# Patient Record
Sex: Female | Born: 1965 | Race: White | Hispanic: No | Smoking: Former smoker
Health system: Southern US, Community
[De-identification: ages and names within clinical notes are randomized; demographics above are authoritative.]

## PROBLEM LIST (undated history)

## (undated) DIAGNOSIS — C801 Malignant (primary) neoplasm, unspecified: Secondary | ICD-10-CM

## (undated) DIAGNOSIS — T4145XA Adverse effect of unspecified anesthetic, initial encounter: Secondary | ICD-10-CM

## (undated) DIAGNOSIS — Z1509 Genetic susceptibility to other malignant neoplasm: Secondary | ICD-10-CM

## (undated) DIAGNOSIS — K219 Gastro-esophageal reflux disease without esophagitis: Secondary | ICD-10-CM

## (undated) DIAGNOSIS — Z98811 Dental restoration status: Secondary | ICD-10-CM

## (undated) DIAGNOSIS — Z1501 Genetic susceptibility to malignant neoplasm of breast: Secondary | ICD-10-CM

## (undated) DIAGNOSIS — Z800 Family history of malignant neoplasm of digestive organs: Secondary | ICD-10-CM

## (undated) DIAGNOSIS — Z853 Personal history of malignant neoplasm of breast: Secondary | ICD-10-CM

## (undated) DIAGNOSIS — R05 Cough: Secondary | ICD-10-CM

## (undated) DIAGNOSIS — M1990 Unspecified osteoarthritis, unspecified site: Secondary | ICD-10-CM

## (undated) DIAGNOSIS — E119 Type 2 diabetes mellitus without complications: Secondary | ICD-10-CM

## (undated) DIAGNOSIS — J3489 Other specified disorders of nose and nasal sinuses: Secondary | ICD-10-CM

## (undated) DIAGNOSIS — T8859XA Other complications of anesthesia, initial encounter: Secondary | ICD-10-CM

## (undated) DIAGNOSIS — R519 Headache, unspecified: Secondary | ICD-10-CM

## (undated) DIAGNOSIS — R222 Localized swelling, mass and lump, trunk: Secondary | ICD-10-CM

## (undated) HISTORY — PX: COLONOSCOPY: SHX174

## (undated) HISTORY — DX: Genetic susceptibility to malignant neoplasm of breast: Z15.01

## (undated) HISTORY — DX: Family history of malignant neoplasm of digestive organs: Z80.0

## (undated) HISTORY — PX: ABDOMINAL HYSTERECTOMY: SHX81

## (undated) HISTORY — DX: Genetic susceptibility to other malignant neoplasm: Z15.09

## (undated) MED FILL — Dexamethasone Sodium Phosphate Inj 100 MG/10ML: INTRAMUSCULAR | Qty: 1 | Status: AC

## (undated) MED FILL — Fosaprepitant Dimeglumine For IV Infusion 150 MG (Base Eq): INTRAVENOUS | Qty: 5 | Status: AC

---

## 1993-08-07 HISTORY — PX: CHOLECYSTECTOMY: SHX55

## 1999-06-22 ENCOUNTER — Inpatient Hospital Stay (HOSPITAL_COMMUNITY): Admission: AD | Admit: 1999-06-22 | Discharge: 1999-06-22 | Payer: Self-pay | Admitting: Obstetrics and Gynecology

## 1999-07-26 ENCOUNTER — Encounter: Admission: RE | Admit: 1999-07-26 | Discharge: 1999-10-24 | Payer: Self-pay | Admitting: Obstetrics & Gynecology

## 1999-09-30 ENCOUNTER — Inpatient Hospital Stay (HOSPITAL_COMMUNITY): Admission: AD | Admit: 1999-09-30 | Discharge: 1999-10-03 | Payer: Self-pay | Admitting: Obstetrics and Gynecology

## 2001-02-25 ENCOUNTER — Emergency Department (HOSPITAL_COMMUNITY): Admission: EM | Admit: 2001-02-25 | Discharge: 2001-02-25 | Payer: Self-pay | Admitting: *Deleted

## 2001-02-25 ENCOUNTER — Encounter: Payer: Self-pay | Admitting: Emergency Medicine

## 2003-10-19 ENCOUNTER — Other Ambulatory Visit: Admission: RE | Admit: 2003-10-19 | Discharge: 2003-10-19 | Payer: Self-pay | Admitting: Obstetrics and Gynecology

## 2004-01-31 ENCOUNTER — Inpatient Hospital Stay (HOSPITAL_COMMUNITY): Admission: AD | Admit: 2004-01-31 | Discharge: 2004-01-31 | Payer: Self-pay | Admitting: Obstetrics and Gynecology

## 2004-04-29 ENCOUNTER — Inpatient Hospital Stay (HOSPITAL_COMMUNITY): Admission: RE | Admit: 2004-04-29 | Discharge: 2004-05-02 | Payer: Self-pay | Admitting: Obstetrics and Gynecology

## 2004-05-31 ENCOUNTER — Other Ambulatory Visit: Admission: RE | Admit: 2004-05-31 | Discharge: 2004-05-31 | Payer: Self-pay | Admitting: Obstetrics and Gynecology

## 2004-09-06 ENCOUNTER — Encounter: Admission: RE | Admit: 2004-09-06 | Discharge: 2004-10-06 | Payer: Self-pay | Admitting: Obstetrics and Gynecology

## 2011-08-19 ENCOUNTER — Emergency Department (HOSPITAL_COMMUNITY): Payer: Self-pay

## 2011-08-19 ENCOUNTER — Encounter (HOSPITAL_COMMUNITY): Payer: Self-pay | Admitting: *Deleted

## 2011-08-19 ENCOUNTER — Emergency Department (HOSPITAL_COMMUNITY)
Admission: EM | Admit: 2011-08-19 | Discharge: 2011-08-19 | Disposition: A | Discharge status: Home / Self Care | Payer: Self-pay | Attending: Emergency Medicine | Admitting: Emergency Medicine

## 2011-08-19 DIAGNOSIS — R109 Unspecified abdominal pain: Secondary | ICD-10-CM | POA: Insufficient documentation

## 2011-08-19 DIAGNOSIS — R300 Dysuria: Secondary | ICD-10-CM | POA: Insufficient documentation

## 2011-08-19 DIAGNOSIS — N949 Unspecified condition associated with female genital organs and menstrual cycle: Secondary | ICD-10-CM | POA: Insufficient documentation

## 2011-08-19 DIAGNOSIS — Z79899 Other long term (current) drug therapy: Secondary | ICD-10-CM | POA: Insufficient documentation

## 2011-08-19 LAB — URINALYSIS, ROUTINE W REFLEX MICROSCOPIC
Bilirubin Urine: NEGATIVE
Glucose, UA: NEGATIVE mg/dL
Ketones, ur: NEGATIVE mg/dL
Leukocytes, UA: NEGATIVE
Nitrite: NEGATIVE
Protein, ur: NEGATIVE mg/dL
Specific Gravity, Urine: 1.004 — ABNORMAL LOW (ref 1.005–1.030)
Urobilinogen, UA: 0.2 mg/dL (ref 0.0–1.0)
pH: 6 (ref 5.0–8.0)

## 2011-08-19 LAB — DIFFERENTIAL
Basophils Absolute: 0 10*3/uL (ref 0.0–0.1)
Basophils Relative: 0 % (ref 0–1)
Eosinophils Absolute: 0.1 10*3/uL (ref 0.0–0.7)
Eosinophils Relative: 2 % (ref 0–5)
Lymphocytes Relative: 23 % (ref 12–46)
Lymphs Abs: 1.7 10*3/uL (ref 0.7–4.0)
Monocytes Absolute: 0.4 10*3/uL (ref 0.1–1.0)
Monocytes Relative: 5 % (ref 3–12)
Neutro Abs: 5.1 10*3/uL (ref 1.7–7.7)
Neutrophils Relative %: 70 % (ref 43–77)

## 2011-08-19 LAB — WET PREP, GENITAL
Trich, Wet Prep: NONE SEEN
Yeast Wet Prep HPF POC: NONE SEEN

## 2011-08-19 LAB — CBC
HCT: 40.7 % (ref 36.0–46.0)
Hemoglobin: 13.7 g/dL (ref 12.0–15.0)
MCH: 29.1 pg (ref 26.0–34.0)
MCHC: 33.7 g/dL (ref 30.0–36.0)
MCV: 86.6 fL (ref 78.0–100.0)
Platelets: 292 10*3/uL (ref 150–400)
RBC: 4.7 MIL/uL (ref 3.87–5.11)
RDW: 13.8 % (ref 11.5–15.5)
WBC: 7.3 10*3/uL (ref 4.0–10.5)

## 2011-08-19 LAB — BASIC METABOLIC PANEL
BUN: 7 mg/dL (ref 6–23)
CO2: 24 mEq/L (ref 19–32)
Calcium: 9.4 mg/dL (ref 8.4–10.5)
Chloride: 101 mEq/L (ref 96–112)
Creatinine, Ser: 0.65 mg/dL (ref 0.50–1.10)
GFR calc Af Amer: >90 mL/min (ref >90)
GFR calc non Af Amer: >90 mL/min (ref >90)
Glucose, Bld: 100 mg/dL — ABNORMAL HIGH (ref 70–99)
Potassium: 3.8 mEq/L (ref 3.5–5.1)
Sodium: 137 mEq/L (ref 135–145)

## 2011-08-19 LAB — PREGNANCY, URINE: Preg Test, Ur: NEGATIVE

## 2011-08-19 LAB — URINE MICROSCOPIC-ADD ON

## 2011-08-19 MED ORDER — ONDANSETRON HCL 4 MG PO TABS: 4.0000 mg | ORAL_TABLET | Freq: Four times a day (QID) | ORAL | Status: DC

## 2011-08-19 MED ORDER — ONDANSETRON HCL 4 MG/2ML IJ SOLN
4.0000 mg | Freq: Once | INTRAMUSCULAR | Status: AC
  Administered 2011-08-19: 4 mg via INTRAVENOUS
  Filled 2011-08-19: qty 2

## 2011-08-19 MED ORDER — KETOROLAC TROMETHAMINE 30 MG/ML IJ SOLN
30.0000 mg | Freq: Once | INTRAMUSCULAR | Status: AC
  Administered 2011-08-19: 30 mg via INTRAVENOUS
  Filled 2011-08-19: qty 1

## 2011-08-19 MED ORDER — OXYCODONE-ACETAMINOPHEN 5-325 MG PO TABS: 2.0000 | ORAL_TABLET | ORAL | Status: AC | PRN

## 2011-08-19 MED ORDER — SODIUM CHLORIDE 0.9 % IV BOLUS (SEPSIS)
1000.0000 mL | Freq: Once | INTRAVENOUS | Status: AC
  Administered 2011-08-19: 1000 mL via INTRAVENOUS

## 2011-08-19 MED ORDER — PROMETHAZINE HCL 25 MG/ML IJ SOLN
25.0000 mg | INTRAMUSCULAR | Status: AC
  Administered 2011-08-19: 25 mg via INTRAVENOUS
  Filled 2011-08-19: qty 1

## 2011-08-19 MED ORDER — HYDROMORPHONE HCL PF 1 MG/ML IJ SOLN
1.0000 mg | Freq: Once | INTRAMUSCULAR | Status: AC
  Administered 2011-08-19: 1 mg via INTRAVENOUS
  Filled 2011-08-19: qty 1

## 2011-08-19 MED ORDER — IBUPROFEN 800 MG PO TABS: 800.0000 mg | ORAL_TABLET | Freq: Three times a day (TID) | ORAL | Status: AC

## 2011-08-19 MED ORDER — OXYCODONE-ACETAMINOPHEN 5-325 MG PO TABS
2.0000 | ORAL_TABLET | Freq: Once | ORAL | Status: AC
  Administered 2011-08-19: 2 via ORAL
  Filled 2011-08-19: qty 2

## 2011-08-19 NOTE — ED Notes (Signed)
Pt resting with no active vomiting at this time.

## 2011-08-19 NOTE — ED Notes (Signed)
Patient is resting comfortably. 

## 2011-08-19 NOTE — ED Notes (Signed)
Pelvic cart setup ready in CDU 11. MD made aware.

## 2011-08-19 NOTE — ED Provider Notes (Signed)
History     CSN: [id number]  Arrival date & time 08/19/11  1509   First MD Initiated Contact with Patient 08/19/11 1537      Chief Complaint  Patient presents with  . Nephrolithiasis    (Consider location/radiation/quality/duration/timing/severity/associated sxs/prior treatment) HPI Comments: Left-sided flank pain that started last night.  It comes and goes in waves it is sharp and stabbing it only goes away for about 30 seconds but comes back. It radiates to the left side of her abdomen to the suprapubic area. Taking Motrin without. She denies any nausea or vomiting, fever, dysuria or hematuria. She is on her menstrual cycle now but has no abnormal vaginal bleeding or discharge.  No history of kidney stones. No chest pain, shortness of breath.  No change in bowel habits.  The history is provided by the patient.    Past Medical History  Diagnosis Date  . Gestational diabetes     Past Surgical History  Procedure Date  . Cholecystectomy     History reviewed. No pertinent family history.  History  Substance Use Topics  . Smoking status: Former Games developer  . Smokeless tobacco: Not on file  . Alcohol Use: Yes    OB History    Grav Para Term Preterm Abortions TAB SAB Ect Mult Living                  Review of Systems  Constitutional: Positive for activity change and appetite change. Negative for fever.  HENT: Negative for congestion and rhinorrhea.   Respiratory: Negative for cough and shortness of breath.   Cardiovascular: Negative for chest pain.  Gastrointestinal: Positive for abdominal pain. Negative for nausea and vomiting.  Genitourinary: Positive for flank pain, difficulty urinating and pelvic pain. Negative for dysuria, hematuria, vaginal bleeding and vaginal discharge.  Musculoskeletal: Negative for back pain.  Skin: Negative for rash.  Neurological: Negative for weakness and headaches.    Allergies  Codeine  Home Medications   Current Outpatient Rx  Name  Route Sig Dispense Refill  . DIPHENHYDRAMINE-APAP (SLEEP) 25-500 MG PO TABS Oral Take 2 tablets by mouth at bedtime as needed. To help sleep for pain.    . IBUPROFEN 200 MG PO TABS Oral Take 800 mg by mouth every 6 (six) hours as needed. For pain.    . IBUPROFEN 800 MG PO TABS Oral Take 1 tablet (800 mg total) by mouth 3 (three) times daily. 21 tablet 0  . ONDANSETRON HCL 4 MG PO TABS Oral Take 1 tablet (4 mg total) by mouth every 6 (six) hours. 12 tablet 0  . OXYCODONE-ACETAMINOPHEN 5-325 MG PO TABS Oral Take 2 tablets by mouth every 4 (four) hours as needed for pain. 15 tablet 0    BP 106/70  Pulse 84  Temp(Src) 98.1 F (36.7 C) (Oral)  Resp 18  SpO2 97%  Physical Exam  Constitutional: She is oriented to person, place, and time. She appears well-developed and well-nourished. She appears distressed.       Uncomfortable rocking back and forth on a chair  HENT:  Head: Normocephalic and atraumatic.  Mouth/Throat: Oropharynx is clear and moist. No oropharyngeal exudate.  Eyes: Conjunctivae are normal. Pupils are equal, round, and reactive to light.  Neck: Normal range of motion. Neck supple.  Cardiovascular: Normal rate, regular rhythm and normal heart sounds.   Pulmonary/Chest: Effort normal and breath sounds normal. No respiratory distress.  Abdominal: Soft. There is tenderness. There is no rebound and no guarding.  L sided pain, LLQ and flank pain.  Genitourinary: There is no tenderness on the right labia. There is no tenderness on the left labia. Cervix exhibits no motion tenderness and no discharge. Right adnexum displays no mass and no tenderness. Left adnexum displays tenderness. Left adnexum displays no mass.  Musculoskeletal: She exhibits tenderness.       L CVAT  Neurological: She is alert and oriented to person, place, and time. No cranial nerve deficit.  Skin: Skin is warm and dry.    ED Course  Procedures (including critical care time)  Labs Reviewed  URINALYSIS,  ROUTINE W REFLEX MICROSCOPIC - Abnormal; Notable for the following:    Specific Gravity, Urine 1.004 (*)    Hgb urine dipstick LARGE (*)    All other components within normal limits  BASIC METABOLIC PANEL - Abnormal; Notable for the following:    Glucose, Bld 100 (*)    All other components within normal limits  URINE MICROSCOPIC-ADD ON - Abnormal; Notable for the following:    Squamous Epithelial / LPF FEW (*)    All other components within normal limits  WET PREP, GENITAL - Abnormal; Notable for the following:    Clue Cells, Wet Prep FEW (*)    WBC, Wet Prep HPF POC FEW (*)    All other components within normal limits  PREGNANCY, URINE  CBC  DIFFERENTIAL  GC/CHLAMYDIA PROBE AMP, GENITAL   Ct Abdomen Pelvis Wo Contrast  08/19/2011  *RADIOLOGY REPORT*  Clinical Data: Low back and left flank pain question kidney stone  CT ABDOMEN AND PELVIS WITHOUT CONTRAST  Technique:  Multidetector CT imaging of the abdomen and pelvis was performed following the standard protocol without intravenous contrast. Sagittal and coronal MPR images reconstructed from axial data set.  Comparison: None  Findings: Minimal dependent atelectasis left lower lobe. Gallbladder surgically absent. Kidneys normal appearance without hydronephrosis, ureteral dilatation or urinary tract calcification. Bladder decompressed. Within limits of a nonenhanced exam, no focal abnormalities of the spleen, pancreas, or adrenal glands. Patchy attenuation of liver suspect related to fatty infiltration and regional sparing. Unremarkable uterus and adnexae. Normal appendix. Stomach and bowel loops unremarkable for technique. No mass, adenopathy, free fluid or inflammatory process. No hernia or acute bony lesion.  IMPRESSION: No acute intra abdominal or intrapelvic abnormalities.  Original Report Authenticated By: Lollie Marrow, M.D.   US Transvaginal Non-ob  08/19/2011  *RADIOLOGY REPORT*  Clinical Data: Left adnexal pain  TRANSABDOMINAL AND  TRANSVAGINAL ULTRASOUND OF PELVIS Technique:  Both transabdominal and transvaginal ultrasound examinations of the pelvis were performed. Transabdominal technique was performed for global imaging of the pelvis including uterus, ovaries, adnexal regions, and pelvic cul-de-sac.  Comparison: None   It was necessary to proceed with endovaginal exam following the transabdominal exam to visualize the ovaries.  Findings:  Uterus: 9.5 x 5.4 x 5.9 cm.  Nabothian cyst at cervix.  No gross mass otherwise seen.  Endometrium: 11 mm thick, borderline thickened.  No endometrial fluid.  Right ovary:  Not identified on either transabdominal or endovaginal imaging, question related to obscuration by bowel.  Left ovary: Not identified on either transabdominal or endovaginal imaging, question related to obscuration by bowel  Other findings: No free pelvic fluid or adnexal masses identified.  IMPRESSION: Nabothian cyst at cervix. Borderline prominent endometrial complex. Nonvisualization of ovaries.  Original Report Authenticated By: Lollie Marrow, M.D.   US Pelvis Complete  08/19/2011  *RADIOLOGY REPORT*  Clinical Data: Left adnexal pain  TRANSABDOMINAL AND TRANSVAGINAL ULTRASOUND  OF PELVIS Technique:  Both transabdominal and transvaginal ultrasound examinations of the pelvis were performed. Transabdominal technique was performed for global imaging of the pelvis including uterus, ovaries, adnexal regions, and pelvic cul-de-sac.  Comparison: None   It was necessary to proceed with endovaginal exam following the transabdominal exam to visualize the ovaries.  Findings:  Uterus: 9.5 x 5.4 x 5.9 cm.  Nabothian cyst at cervix.  No gross mass otherwise seen.  Endometrium: 11 mm thick, borderline thickened.  No endometrial fluid.  Right ovary:  Not identified on either transabdominal or endovaginal imaging, question related to obscuration by bowel.  Left ovary: Not identified on either transabdominal or endovaginal imaging, question related  to obscuration by bowel  Other findings: No free pelvic fluid or adnexal masses identified.  IMPRESSION: Nabothian cyst at cervix. Borderline prominent endometrial complex. Nonvisualization of ovaries.  Original Report Authenticated By: Lollie Marrow, M.D.     1. Flank pain       MDM  Left-sided flank pain associated with difficulty urination dysuria. No nausea no vomiting. Patient is uncomfortable appearing but stable vitals. Abdomen soft and nonsurgical.  Concern for nephrolithiasis versus pyelonephritis versus urinary tract infection versus diverticulitis or colitis.  Urinalysis shows red cells the patient but the patient is on her menstrual cycle. CT scan is not showing evidence of nephrolithiasis. Is possible patient passed a small kidney stone but given the extent of her pain I did go ahead with a pelvic exam and pelvic ultrasound to evaluate her adnexa. He does have left adnexal tenderness. Ultrasound was unable to visualize ovaries, but CT scan did not show any masses or cysts.  Patient's pain is under control and tolerating by mouth. We'll arrange followup with gynecology as needed.    Glynn Octave, MD 08/19/11 2328

## 2011-08-19 NOTE — ED Notes (Signed)
PT ambulated with a steady gait; VSS; no signs of distress; A&Ox3; respirations even and unlabored; skin warm and dry.  

## 2011-08-19 NOTE — ED Notes (Signed)
Patient with possible kidney stones.  Started yesterday and is getting worse.  Pain is on her lower back and right flank area

## 2011-08-19 NOTE — ED Notes (Signed)
PT placed on O2

## 2011-08-19 NOTE — ED Notes (Signed)
Pt has severe L CVA pain, and jumps and guards when touched in that area

## 2011-08-19 NOTE — ED Notes (Signed)
Patient transported to Ultrasound 

## 2011-08-21 ENCOUNTER — Emergency Department (HOSPITAL_COMMUNITY)
Admission: EM | Admit: 2011-08-21 | Discharge: 2011-08-21 | Disposition: A | Discharge status: Home / Self Care | Payer: Self-pay | Attending: Emergency Medicine | Admitting: Emergency Medicine

## 2011-08-21 ENCOUNTER — Emergency Department (HOSPITAL_COMMUNITY): Payer: Self-pay

## 2011-08-21 ENCOUNTER — Encounter (HOSPITAL_COMMUNITY): Payer: Self-pay

## 2011-08-21 DIAGNOSIS — N898 Other specified noninflammatory disorders of vagina: Secondary | ICD-10-CM | POA: Insufficient documentation

## 2011-08-21 DIAGNOSIS — M549 Dorsalgia, unspecified: Secondary | ICD-10-CM | POA: Insufficient documentation

## 2011-08-21 DIAGNOSIS — B029 Zoster without complications: Secondary | ICD-10-CM | POA: Insufficient documentation

## 2011-08-21 DIAGNOSIS — R109 Unspecified abdominal pain: Secondary | ICD-10-CM | POA: Insufficient documentation

## 2011-08-21 DIAGNOSIS — R112 Nausea with vomiting, unspecified: Secondary | ICD-10-CM | POA: Insufficient documentation

## 2011-08-21 LAB — URINALYSIS, ROUTINE W REFLEX MICROSCOPIC
Glucose, UA: NEGATIVE mg/dL
Ketones, ur: 40 mg/dL — AB
Nitrite: POSITIVE — AB
Protein, ur: >300 mg/dL — AB
Specific Gravity, Urine: 1.013 (ref 1.005–1.030)
Urobilinogen, UA: 1 mg/dL (ref 0.0–1.0)
pH: 6.5 (ref 5.0–8.0)

## 2011-08-21 LAB — BASIC METABOLIC PANEL
BUN: 5 mg/dL — ABNORMAL LOW (ref 6–23)
CO2: 27 mEq/L (ref 19–32)
Calcium: 9.1 mg/dL (ref 8.4–10.5)
Chloride: 101 mEq/L (ref 96–112)
Creatinine, Ser: 0.62 mg/dL (ref 0.50–1.10)
GFR calc Af Amer: >90 mL/min (ref >90)
GFR calc non Af Amer: >90 mL/min (ref >90)
Glucose, Bld: 116 mg/dL — ABNORMAL HIGH (ref 70–99)
Potassium: 4 mEq/L (ref 3.5–5.1)
Sodium: 139 mEq/L (ref 135–145)

## 2011-08-21 LAB — CBC
HCT: 40.9 % (ref 36.0–46.0)
Hemoglobin: 13.6 g/dL (ref 12.0–15.0)
MCH: 29.1 pg (ref 26.0–34.0)
MCHC: 33.3 g/dL (ref 30.0–36.0)
MCV: 87.4 fL (ref 78.0–100.0)
Platelets: 266 10*3/uL (ref 150–400)
RBC: 4.68 MIL/uL (ref 3.87–5.11)
RDW: 13.7 % (ref 11.5–15.5)
WBC: 6.4 10*3/uL (ref 4.0–10.5)

## 2011-08-21 LAB — DIFFERENTIAL
Basophils Absolute: 0 10*3/uL (ref 0.0–0.1)
Basophils Relative: 0 % (ref 0–1)
Eosinophils Absolute: 0.1 10*3/uL (ref 0.0–0.7)
Eosinophils Relative: 1 % (ref 0–5)
Lymphocytes Relative: 21 % (ref 12–46)
Lymphs Abs: 1.3 10*3/uL (ref 0.7–4.0)
Monocytes Absolute: 0.3 10*3/uL (ref 0.1–1.0)
Monocytes Relative: 5 % (ref 3–12)
Neutro Abs: 4.7 10*3/uL (ref 1.7–7.7)
Neutrophils Relative %: 74 % (ref 43–77)

## 2011-08-21 LAB — GC/CHLAMYDIA PROBE AMP, GENITAL
Chlamydia, DNA Probe: NEGATIVE
GC Probe Amp, Genital: NEGATIVE

## 2011-08-21 LAB — URINE MICROSCOPIC-ADD ON

## 2011-08-21 MED ORDER — METOCLOPRAMIDE HCL 10 MG PO TABS: 10.0000 mg | ORAL_TABLET | Freq: Four times a day (QID) | ORAL | Status: AC

## 2011-08-21 MED ORDER — ONDANSETRON HCL 4 MG/2ML IJ SOLN
4.0000 mg | Freq: Once | INTRAMUSCULAR | Status: AC
  Administered 2011-08-21: 4 mg via INTRAVENOUS
  Filled 2011-08-21: qty 2

## 2011-08-21 MED ORDER — HYDROMORPHONE HCL PF 1 MG/ML IJ SOLN
1.0000 mg | Freq: Once | INTRAMUSCULAR | Status: AC
  Administered 2011-08-21: 1 mg via INTRAVENOUS
  Filled 2011-08-21: qty 1

## 2011-08-21 MED ORDER — KETOROLAC TROMETHAMINE 30 MG/ML IJ SOLN
30.0000 mg | Freq: Once | INTRAMUSCULAR | Status: AC
  Administered 2011-08-21: 30 mg via INTRAVENOUS
  Filled 2011-08-21 (×2): qty 1

## 2011-08-21 MED ORDER — LORAZEPAM 2 MG/ML IJ SOLN
1.0000 mg | Freq: Once | INTRAMUSCULAR | Status: AC
  Administered 2011-08-21: 1 mg via INTRAVENOUS
  Filled 2011-08-21: qty 1

## 2011-08-21 MED ORDER — METOCLOPRAMIDE HCL 5 MG/ML IJ SOLN
10.0000 mg | Freq: Once | INTRAMUSCULAR | Status: AC
  Administered 2011-08-21: 10 mg via INTRAVENOUS
  Filled 2011-08-21 (×2): qty 2

## 2011-08-21 MED ORDER — ACYCLOVIR 800 MG PO TABS: 800.0000 mg | ORAL_TABLET | Freq: Every day | ORAL | Status: AC

## 2011-08-21 MED ORDER — OXYCODONE-ACETAMINOPHEN 5-325 MG PO TABS: ORAL_TABLET | ORAL | Status: DC

## 2011-08-21 NOTE — ED Provider Notes (Signed)
Patient having recurrent pain - additional medications ordered. Will continue to observe.  Rodena Medin, PA-C 08/23/11 2350

## 2011-08-21 NOTE — ED Notes (Signed)
Pt speaking with case manager currently, IV team nurse just left bedside unable to get IV access, MD notified

## 2011-08-21 NOTE — ED Provider Notes (Addendum)
History     CSN: [id number]  Arrival date & time 08/21/11  1327   First MD Initiated Contact with Patient 08/21/11 1348      Chief Complaint  Patient presents with  . Back Pain  . Flank Pain    (Consider location/radiation/quality/duration/timing/severity/associated sxs/prior treatment) Patient is a 46 y.o. female presenting with flank pain. The history is provided by the patient.  Flank Pain This is a new problem. Episode onset: 3 days ago. The problem occurs constantly. The problem has been gradually worsening. Associated symptoms include abdominal pain. Pertinent negatives include no chest pain and no shortness of breath. Associated symptoms comments: No Fever, dysuria, frequency, urgency. The symptoms are aggravated by walking, bending and twisting (Palpation). The symptoms are relieved by nothing. She has tried nothing (percocet, ibuprofen, tylenol) for the symptoms. The treatment provided no relief.    Past Medical History  Diagnosis Date  . Gestational diabetes     Past Surgical History  Procedure Date  . Cholecystectomy     History reviewed. No pertinent family history.  History  Substance Use Topics  . Smoking status: Former Games developer  . Smokeless tobacco: Not on file  . Alcohol Use: Yes    OB History    Grav Para Term Preterm Abortions TAB SAB Ect Mult Living                  Review of Systems  Respiratory: Negative for shortness of breath.   Cardiovascular: Negative for chest pain.  Gastrointestinal: Positive for nausea, vomiting and abdominal pain. Negative for diarrhea and constipation.  Genitourinary: Positive for flank pain and vaginal bleeding. Negative for dysuria and vaginal discharge.  All other systems reviewed and are negative.    Allergies  Codeine  Home Medications   Current Outpatient Rx  Name Route Sig Dispense Refill  . DIPHENHYDRAMINE-APAP (SLEEP) 25-500 MG PO TABS Oral Take 2 tablets by mouth at bedtime as needed. To help sleep  for pain.    . IBUPROFEN 800 MG PO TABS Oral Take 1 tablet (800 mg total) by mouth 3 (three) times daily. 21 tablet 0  . OXYCODONE-ACETAMINOPHEN 5-325 MG PO TABS Oral Take 2 tablets by mouth every 4 (four) hours as needed for pain. 15 tablet 0    BP 141/91  Pulse 100  Temp(Src) 98.8 F (37.1 C) (Oral)  Resp 22  Ht 5\' 3"  (1.6 m)  Wt 210 lb (95.255 kg)  BMI 37.20 kg/m2  SpO2 97%  LMP 08/21/2011  Physical Exam  Nursing note and vitals reviewed. Constitutional: She is oriented to person, place, and time. She appears well-developed and well-nourished. She appears distressed.  HENT:  Head: Normocephalic and atraumatic.  Mouth/Throat: Oropharynx is clear and moist.  Eyes: Conjunctivae and EOM are normal. Pupils are equal, round, and reactive to light.  Neck: Normal range of motion. Neck supple.  Cardiovascular: Normal rate, regular rhythm and intact distal pulses.   No murmur heard. Pulmonary/Chest: Effort normal and breath sounds normal. No respiratory distress. She has no wheezes. She has no rales.  Abdominal: Soft. She exhibits no distension. There is tenderness. There is guarding and CVA tenderness. There is no rebound.  Musculoskeletal: Normal range of motion. She exhibits no edema and no tenderness.  Neurological: She is alert and oriented to person, place, and time.  Skin: Skin is warm and dry. No rash noted. No erythema.  Psychiatric: She has a normal mood and affect. Her behavior is normal.    ED Course  Procedures (including critical care time)   Labs Reviewed  CBC  DIFFERENTIAL  BASIC METABOLIC PANEL  URINALYSIS, ROUTINE W REFLEX MICROSCOPIC   Ct Abdomen Pelvis Wo Contrast  08/19/2011  *RADIOLOGY REPORT*  Clinical Data: Low back and left flank pain question kidney stone  CT ABDOMEN AND PELVIS WITHOUT CONTRAST  Technique:  Multidetector CT imaging of the abdomen and pelvis was performed following the standard protocol without intravenous contrast. Sagittal and coronal  MPR images reconstructed from axial data set.  Comparison: None  Findings: Minimal dependent atelectasis left lower lobe. Gallbladder surgically absent. Kidneys normal appearance without hydronephrosis, ureteral dilatation or urinary tract calcification. Bladder decompressed. Within limits of a nonenhanced exam, no focal abnormalities of the spleen, pancreas, or adrenal glands. Patchy attenuation of liver suspect related to fatty infiltration and regional sparing. Unremarkable uterus and adnexae. Normal appendix. Stomach and bowel loops unremarkable for technique. No mass, adenopathy, free fluid or inflammatory process. No hernia or acute bony lesion.  IMPRESSION: No acute intra abdominal or intrapelvic abnormalities.  Original Report Authenticated By: Lollie Marrow, M.D.   US Transvaginal Non-ob  08/19/2011  *RADIOLOGY REPORT*  Clinical Data: Left adnexal pain  TRANSABDOMINAL AND TRANSVAGINAL ULTRASOUND OF PELVIS Technique:  Both transabdominal and transvaginal ultrasound examinations of the pelvis were performed. Transabdominal technique was performed for global imaging of the pelvis including uterus, ovaries, adnexal regions, and pelvic cul-de-sac.  Comparison: None   It was necessary to proceed with endovaginal exam following the transabdominal exam to visualize the ovaries.  Findings:  Uterus: 9.5 x 5.4 x 5.9 cm.  Nabothian cyst at cervix.  No gross mass otherwise seen.  Endometrium: 11 mm thick, borderline thickened.  No endometrial fluid.  Right ovary:  Not identified on either transabdominal or endovaginal imaging, question related to obscuration by bowel.  Left ovary: Not identified on either transabdominal or endovaginal imaging, question related to obscuration by bowel  Other findings: No free pelvic fluid or adnexal masses identified.  IMPRESSION: Nabothian cyst at cervix. Borderline prominent endometrial complex. Nonvisualization of ovaries.  Original Report Authenticated By: Lollie Marrow, M.D.    US Pelvis Complete  08/19/2011  *RADIOLOGY REPORT*  Clinical Data: Left adnexal pain  TRANSABDOMINAL AND TRANSVAGINAL ULTRASOUND OF PELVIS Technique:  Both transabdominal and transvaginal ultrasound examinations of the pelvis were performed. Transabdominal technique was performed for global imaging of the pelvis including uterus, ovaries, adnexal regions, and pelvic cul-de-sac.  Comparison: None   It was necessary to proceed with endovaginal exam following the transabdominal exam to visualize the ovaries.  Findings:  Uterus: 9.5 x 5.4 x 5.9 cm.  Nabothian cyst at cervix.  No gross mass otherwise seen.  Endometrium: 11 mm thick, borderline thickened.  No endometrial fluid.  Right ovary:  Not identified on either transabdominal or endovaginal imaging, question related to obscuration by bowel.  Left ovary: Not identified on either transabdominal or endovaginal imaging, question related to obscuration by bowel  Other findings: No free pelvic fluid or adnexal masses identified.  IMPRESSION: Nabothian cyst at cervix. Borderline prominent endometrial complex. Nonvisualization of ovaries.  Original Report Authenticated By: Lollie Marrow, M.D.     No diagnosis found.    MDM   Abrupt onset of left flank pain rating around into the abdomen that started on Friday. Patient was seen ER on 08/19/2011 at that time she had a ultrasound that ruled out ovarian cysts and a CT that was negative. She did have hematuria however she states she is on her period  and most likely was a contaminant. Patient's symptoms sound like a kidney stone however she has not been able to get pain control despite ibuprofen and Percocet. Straightly uncomfortable on exam without any infectious symptoms. Movement and palpation make her pain worse. She denies any urinary symptoms. There are no rashes to suggest zoster.  Will repeat CBC, BMP, cathed UA, KUB and give pain control.  Pt moved to CDU.      Gwyneth Sprout, MD 08/21/11  1452  Gwyneth Sprout, MD 08/21/11 1506

## 2011-08-21 NOTE — ED Notes (Signed)
Here for left flank and back pain, radiaitign to groin, started on Friday seen for same then. sts that no help with meds given. No issues with uriantion.

## 2011-08-21 NOTE — ED Notes (Signed)
Pt requesting a doctor's note to be faxed to their attorney stating that "patient is in no condition to go to court on tomorrow." informed patient that we could provide her with a general note stating that she was here in the ED and that we are unable to take the responsibility to faxing it her her attorney d/t legal issues. Patient not satisfied with this and requests to see the MD. Informed Horris Latino, PA who will see the patient. Spoke with Elita Quick, AD who agrees with RN and states that we are unable to personalize notes for patients nor fax anything HIPPA standards.

## 2011-08-24 NOTE — ED Provider Notes (Signed)
Medical screening examination/treatment/procedure(s) were conducted as a shared visit with non-physician practitioner(s) and myself.  I personally evaluated the patient during the encounter I initially saw the pt and treatment was continued in the CDU and I have reviewed not and agree with ongoing treatment.  Gwyneth Sprout, MD 08/24/11 [phone]

## 2013-08-07 DIAGNOSIS — Z853 Personal history of malignant neoplasm of breast: Secondary | ICD-10-CM

## 2013-08-07 HISTORY — DX: Personal history of malignant neoplasm of breast: Z85.3

## 2014-02-17 ENCOUNTER — Other Ambulatory Visit: Payer: Self-pay | Admitting: Dermatology

## 2014-03-05 ENCOUNTER — Other Ambulatory Visit: Payer: Self-pay | Admitting: Radiology

## 2014-03-06 ENCOUNTER — Other Ambulatory Visit: Payer: Self-pay | Admitting: Radiology

## 2014-03-06 DIAGNOSIS — C50912 Malignant neoplasm of unspecified site of left female breast: Secondary | ICD-10-CM

## 2014-03-10 ENCOUNTER — Telehealth (INDEPENDENT_AMBULATORY_CARE_PROVIDER_SITE_OTHER): Payer: Self-pay

## 2014-03-10 NOTE — Telephone Encounter (Signed)
LMOM for pt to call me. I need to speak to her about r/s the appt from 8/6 to 8/5 per Dr Cristal Generous request.

## 2014-03-11 ENCOUNTER — Ambulatory Visit (INDEPENDENT_AMBULATORY_CARE_PROVIDER_SITE_OTHER): Payer: 59 | Admitting: General Surgery

## 2014-03-11 ENCOUNTER — Encounter (INDEPENDENT_AMBULATORY_CARE_PROVIDER_SITE_OTHER): Payer: Self-pay | Admitting: General Surgery

## 2014-03-11 VITALS — BP 110/78 | HR 80 | Resp 18 | Ht 64.0 in | Wt 231.0 lb

## 2014-03-11 DIAGNOSIS — C50112 Malignant neoplasm of central portion of left female breast: Secondary | ICD-10-CM | POA: Insufficient documentation

## 2014-03-11 DIAGNOSIS — C50119 Malignant neoplasm of central portion of unspecified female breast: Secondary | ICD-10-CM

## 2014-03-11 NOTE — Progress Notes (Signed)
Patient ID: Carlita Whitcomb, female   DOB: 1966-06-30, 48 y.o.   MRN: [id number]  Chief Complaint  Patient presents with  . Other    Eval lt br ca and eval melanoma lt upper back    HPI Aarthi Uyeno is a 48 y.o. female.  Referred by Dr Christene Slates and Dr Nena Polio HPI 63 yof who presents after initially undergoing evaluation with Dr Allyson Sabal as she was told she had a changing mole in her upper midback.  She had numerous significant sun exposures as a kid.  This underwent shave biopsy that shows a superficial spreading melanoma that is 1.7 mm thick.  There is no ulceration.  The margins are mostly clear right now.  MI is 1 per sq mm.  No LVI.  There is partial regression noted.  She has never had a prior mm and the melanoma concerned her. She was doing a breast exam and found a left breast mass.  She then went and got a mm that showed an irregular mass and calcs in left breast total dimension was 2.4 cm.  US showed a 8 mm mass in left breast.  [place] of left axilla was negative.  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  This is grade I-II.  She comes in today to discuss options.  Past Medical History  Diagnosis Date  . Gestational diabetes     Past Surgical History  Procedure Laterality Date  . Cholecystectomy      1195  . Cesarean section  1999  . Cesarean section  2001  . Cesarean section  2005    Family History  Problem Relation Age of Onset  . Cancer Paternal Aunt   . Cancer Paternal Grandmother     Social History History  Substance Use Topics  . Smoking status: Former Research scientist (life sciences)  . Smokeless tobacco: Not on file  . Alcohol Use: No    Allergies  Allergen Reactions  . Codeine Nausea And Vomiting    Current Outpatient Prescriptions  Medication Sig Dispense Refill  . diphenhydramine-acetaminophen (TYLENOL PM) 25-500 MG TABS Take 2 tablets by mouth at bedtime as needed. To help sleep for  pain.       No current facility-administered medications for this visit.    Review of Systems Review of Systems  Constitutional: Negative for fever, chills and unexpected weight change.  HENT: Negative for congestion, hearing loss, sore throat, trouble swallowing and voice change.   Eyes: Negative for visual disturbance.  Respiratory: Positive for cough. Negative for wheezing.   Cardiovascular: Positive for palpitations. Negative for chest pain and leg swelling.  Gastrointestinal: Negative for nausea, vomiting, abdominal pain, diarrhea, constipation, blood in stool, abdominal distention and anal bleeding.  Genitourinary: Negative for hematuria, vaginal bleeding and difficulty urinating.  Musculoskeletal: Negative for arthralgias.  Skin: Negative for rash and wound.  Neurological: Positive for headaches. Negative for seizures and syncope.  Hematological: Negative for adenopathy. Does not bruise/bleed easily.  Psychiatric/Behavioral: Negative for confusion.    Blood pressure 110/78, pulse 80, resp. rate 18, height 5\' 4"  (1.626 m), weight 231 lb (104.781 kg).  Physical Exam Physical Exam  Vitals reviewed. Constitutional: She appears well-developed and well-nourished.  Eyes: No scleral icterus.  Neck: Neck supple.  Cardiovascular: Normal rate, regular rhythm and normal heart sounds.   Pulmonary/Chest: Effort normal and breath sounds normal. She has no wheezes. She has no rales. Right breast  exhibits no inverted nipple, no mass, no nipple discharge, no skin change and no tenderness. Left breast exhibits mass (near nac but difficult to tell right now due to tenderness and hematoma). Left breast exhibits no inverted nipple, no nipple discharge, no skin change and no tenderness.  Lymphadenopathy:    She has no cervical adenopathy.    She has no axillary adenopathy.       Right: No supraclavicular adenopathy present.       Left: No supraclavicular adenopathy present.    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 Breast cancer  We will get her genetics appt due to young age (and melanoma although this appears sun related).  Will wait for surgery until results back. We discussed mri and decided to proceed.  She understands limitations and need for possible biopsies.  Her tumor is favorable and we discussed oncotype on final pathology.  I will call her once mri results are back to start proceeding. We discussed the staging and pathophysiology of breast cancer. We discussed all of the different options for treatment for breast cancer including surgery, chemotherapy, radiation therapy, Herceptin, and antiestrogen therapy.   We discussed a sentinel lymph node biopsy as she does not appear to having lymph node involvement right now. We discussed the performance of that with injection of radioactive tracer. We discussed that she would have an incision underneath her axillary hairline. We discussed that there is a chance of having a positive node with a sentinel lymph node biopsy and we will await the permanent pathology to make any other first further decisions in terms of her treatment. One of these options might be to return to the operating room to perform an axillary lymph node dissection. We discussed up to a 10% risk lifetime of chronic shoulder pain as well as lymphedema associated with a sentinel lymph node biopsy.  I think this risk will also depend on where the melanoma sentinel node is. We discussed the options for treatment of the breast cancer which included lumpectomy versus a mastectomy. We discussed the performance of the lumpectomy with radioactive seed placement. We discussed a 5-10% chance of a positive margin requiring reexcision in the operating room. We also discussed that she will need radiation therapy and antiestrogen therapy she undergoes lumpectomy. We discussed the mastectomy and  the postoperative care for that as well. We discussed that there is no difference in her survival whether she undergoes lumpectomy with radiation therapy versus a mastectomy.  We discussed the risks of operation including bleeding, infection, possible reoperation. She understands her further therapy will be based on what her stages at the time of her operation.  2. Melanoma  This is 1.7 mm thick melanoma.  We discussed therapy for melanoma.  I recommended excision with 2 cm margin ( not sure if this will be deeper on final path) and can do this fairly easily on her back.  I also recommended sentinel node biopsy for the melanoma.  I am not sure where the drainage is so will obtain a lymphoscintogram preoperatively to determine where to do this sentinel node.  I will call her after that.  Will plan on doing all surgeries at same time.        Denese Mentink 03/11/2014, 7:27 PM

## 2014-03-12 ENCOUNTER — Encounter (INDEPENDENT_AMBULATORY_CARE_PROVIDER_SITE_OTHER): Payer: 59 | Admitting: General Surgery

## 2014-03-12 ENCOUNTER — Telehealth (INDEPENDENT_AMBULATORY_CARE_PROVIDER_SITE_OTHER): Payer: Self-pay | Admitting: *Deleted

## 2014-03-12 ENCOUNTER — Telehealth: Payer: Self-pay | Admitting: *Deleted

## 2014-03-12 NOTE — Telephone Encounter (Signed)
Informed pt of message below. Pt verbalized understanding  

## 2014-03-12 NOTE — Telephone Encounter (Signed)
LMOM for pt regarding her NM Lymph/Gland test.  Advised pt it has been scheduled for 03-18-14 at [facility] arriving at 9:45 for 10:00 apt.  No prep required.  Anderson Malta

## 2014-03-12 NOTE — Telephone Encounter (Signed)
Received referral from [place].  Called and left message for pt to return my call so I can schedule her for a genetic appt.

## 2014-03-13 ENCOUNTER — Encounter (INDEPENDENT_AMBULATORY_CARE_PROVIDER_SITE_OTHER): Payer: Self-pay

## 2014-03-13 ENCOUNTER — Ambulatory Visit
Admission: RE | Admit: 2014-03-13 | Discharge: 2014-03-13 | Disposition: A | Discharge status: Home / Self Care | Payer: 59 | Source: Ambulatory Visit | Attending: Radiology | Admitting: Radiology

## 2014-03-13 ENCOUNTER — Telehealth: Payer: Self-pay | Admitting: *Deleted

## 2014-03-13 DIAGNOSIS — C50912 Malignant neoplasm of unspecified site of left female breast: Secondary | ICD-10-CM

## 2014-03-13 MED ORDER — GADOBENATE DIMEGLUMINE 529 MG/ML IV SOLN
20.0000 mL | Freq: Once | INTRAVENOUS | Status: AC | PRN
  Administered 2014-03-13: 20 mL via INTRAVENOUS

## 2014-03-13 NOTE — Telephone Encounter (Signed)
Pt returned my call and I confirmed 03/20/14 genetic appt w/ pt.  Emailed [facility] [place] to make them aware.

## 2014-03-14 ENCOUNTER — Other Ambulatory Visit (INDEPENDENT_AMBULATORY_CARE_PROVIDER_SITE_OTHER): Payer: Self-pay | Admitting: General Surgery

## 2014-03-18 ENCOUNTER — Encounter (HOSPITAL_COMMUNITY)
Admission: RE | Admit: 2014-03-18 | Discharge: 2014-03-18 | Disposition: A | Discharge status: Home / Self Care | Payer: 59 | Source: Ambulatory Visit | Attending: General Surgery | Admitting: General Surgery

## 2014-03-18 DIAGNOSIS — C50119 Malignant neoplasm of central portion of unspecified female breast: Secondary | ICD-10-CM | POA: Insufficient documentation

## 2014-03-18 DIAGNOSIS — C50112 Malignant neoplasm of central portion of left female breast: Secondary | ICD-10-CM

## 2014-03-18 MED ORDER — TECHNETIUM TC 99M SULFUR COLLOID FILTERED
0.5000 | Freq: Once | INTRAVENOUS | Status: AC | PRN
  Administered 2014-03-18: 0.5 via INTRADERMAL

## 2014-03-20 ENCOUNTER — Other Ambulatory Visit: Payer: 59

## 2014-03-20 ENCOUNTER — Ambulatory Visit (HOSPITAL_BASED_OUTPATIENT_CLINIC_OR_DEPARTMENT_OTHER): Payer: 59 | Admitting: Genetic Counselor

## 2014-03-20 ENCOUNTER — Encounter: Payer: Self-pay | Admitting: Genetic Counselor

## 2014-03-20 DIAGNOSIS — IMO0002 Reserved for concepts with insufficient information to code with codable children: Secondary | ICD-10-CM

## 2014-03-20 DIAGNOSIS — C50919 Malignant neoplasm of unspecified site of unspecified female breast: Secondary | ICD-10-CM

## 2014-03-20 DIAGNOSIS — Z803 Family history of malignant neoplasm of breast: Secondary | ICD-10-CM

## 2014-03-20 DIAGNOSIS — C439 Malignant melanoma of skin, unspecified: Secondary | ICD-10-CM | POA: Insufficient documentation

## 2014-03-20 DIAGNOSIS — C50912 Malignant neoplasm of unspecified site of left female breast: Secondary | ICD-10-CM

## 2014-03-20 NOTE — Progress Notes (Signed)
Patient Name: Krystal [name] Patient Age: 48 y.o. Encounter Date: 03/20/2014  Referring Physician: Rolm Bookbinder, MD  Primary Care Provider: Darlyn Chamber, MD   [name], a 48 y.o. female, is being seen at the [facility] due to a personal and family history of breast cancer.  She presents to clinic today to discuss the possibility of a hereditary predisposition to cancer and discuss whether genetic testing is warranted.  HISTORY OF PRESENT ILLNESS: [name] was recently diagnosed with both melanoma and breast cancer at the age of 24. The melanoma is on her upper mid-back and she states she had excessive sun exposure as a child. The breast cancer is in her left breast and is an IDC. The breast tumor is ER positive, PR positive, and HER2 negative. She stated that surgical decisions will depend on results of genetic testing.   Past Medical History  Diagnosis Date  . Gestational diabetes   . Breast cancer   . Melanoma   . Family history of malignant neoplasm of breast     Past Surgical History  Procedure Laterality Date  . Cholecystectomy      1195  . Cesarean section  1999  . Cesarean section  2001  . Cesarean section  2005    History   Social History  . Marital Status: Widowed    Spouse Name: N/A    Number of Children: N/A  . Years of Education: N/A   Social History Main Topics  . Smoking status: Former Research scientist (life sciences)  . Smokeless tobacco: Not on file  . Alcohol Use: No  . Drug Use: No  . Sexual Activity: Not on file   Other Topics Concern  . Not on file   Social History Narrative  . No narrative on file     FAMILY HISTORY:   During the visit, a 4-generation pedigree was obtained. Significant diagnoses include the following:  Family History  Problem Relation Age of Onset  . Breast cancer Paternal Aunt 37    deceased 20s  . Breast cancer Paternal Grandmother     dx 23s; ? if had 2nd BC in 12s; deceased 64s  . Pancreatic cancer Paternal  Uncle 66    smoker; deceased    Additionally, [name] has two sons (age 70 and 40) and a daughter (age 38). She has two brothers (age 19 and 41). Her mother is 65 and cancer-free. There are no cancers in her maternal relatives. Her father is 60 and cancer-free. His only sister and brother's cancer's are noted above.  [name] ancestry is Caucasian - NOS. There is no known Jewish ancestry and no consanguinity.  ASSESSMENT AND PLAN: [name] is a 48 y.o. female with a personal and family history of breast cancer. This history is not highly suggestive of a hereditary predisposition to cancer given the ages of diagnosis in her paternal aunt and paternal grandmother. We reviewed the characteristics, features and inheritance patterns of hereditary cancer syndromes. We also discussed genetic testing, including the process of testing, insurance coverage and implications of results. A negative result will be reassuring for her and her family.  [name] wished to pursue genetic testing and a blood sample will be sent to [facility] for analysis of the BRCA1 and BRCA2 genes. If there is no mutation detected, testing will proceed to analyze the other 15 genes on the BreastNext gene panel. BRCA1/BRCA2 was ordered as a STAT request first to provide this information for surgical decision making. We  discussed the implications of a positive, negative and/ or Variant of Uncertain Significance (VUS) result. Results of BRCA testing should be available in approximately 2 weeks, at which point we will contact her and address implications for her as well as address genetic testing for at-risk family members, if needed.    We encouraged [name] to remain in contact with Cancer Genetics annually so that we can update the family history and inform her of any changes in cancer genetics and testing that may be of benefit for this family. Ms.  Krystal [name] questions were answered to her satisfaction today.   Thank you for the  referral and allowing [place] to share in the care of your patient.   The patient was seen for a total of 30 minutes, greater than 50% of which was spent face-to-face counseling. This patient was discussed with the overseeing provider who agrees with the above.   Steele Berg, MS, [place] Certified Genetic Counseor phone: [phone] [email or web address]

## 2014-03-23 ENCOUNTER — Ambulatory Visit (INDEPENDENT_AMBULATORY_CARE_PROVIDER_SITE_OTHER): Payer: 59 | Admitting: Surgery

## 2014-03-30 ENCOUNTER — Telehealth (INDEPENDENT_AMBULATORY_CARE_PROVIDER_SITE_OTHER): Payer: Self-pay

## 2014-03-30 NOTE — Telephone Encounter (Signed)
LMOM for Dawn asking her to help with trying to see if the genetic testing results are back on this pt since they were ordered STAT. Pt is waiting to make surgical decision pending the results. I asked for Dawn to call me.

## 2014-03-30 NOTE — Telephone Encounter (Signed)
Called pt to just check in with her to see if she had received any word on her genetic testing results that she had done on 03/20/14 with the [facility]. Pt has not heard anything but she was told they were ordering the BRCA1/BRCA2 as a STAT hoping to get results back in one week since her surgical decision is based off of the genetic results. Pt still does want to be scheduled for the other breast bx b/c if genetic positive this will mean to the pt that she is going to get a mastectomy. The pt did mention to me that she wanted me to check with Dr Donne Hazel about going a head with the melanoma sx or can she wait to do both surgeries at the same time. I advised pt that I was going to call the Cancer Ctr to check on the results and I will speak with Dr Donne Hazel today as well. I will call pt back once I know more. Pt understands.

## 2014-03-30 NOTE — Telephone Encounter (Signed)
Varney Biles returned my call b/c Krystal [name] is off today. Varney Biles will check on the genetic testing results and get back in touch with me.

## 2014-04-01 ENCOUNTER — Telehealth (INDEPENDENT_AMBULATORY_CARE_PROVIDER_SITE_OTHER): Payer: Self-pay

## 2014-04-01 ENCOUNTER — Other Ambulatory Visit (INDEPENDENT_AMBULATORY_CARE_PROVIDER_SITE_OTHER): Payer: Self-pay | Admitting: General Surgery

## 2014-04-01 NOTE — Telephone Encounter (Signed)
Pt states that she has been waiting on her genetic testing results. Pt states that she has been having a hard time sleeping. Pt states that she has only been sleeping about 4 hours a night. Pt would like to see if Dr Donne Hazel would call her in something to help her sleep or to relax her nerves. Pt states that she would like something for only about a week. Informed pt that I would send Dr Donne Hazel a message requesting this and we will contact her as soon as we receive a response. Pt verbalized understanding

## 2014-04-01 NOTE — Telephone Encounter (Signed)
i will write for Lorrin Mais now

## 2014-04-01 NOTE — Telephone Encounter (Signed)
Informed pt that Dr Donne Hazel has given her Ambien. Called rx Ambien 10mg  1 tab po q hs prn #10 no refills in. Pt verbalized understanding

## 2014-04-03 ENCOUNTER — Encounter: Payer: Self-pay | Admitting: Genetic Counselor

## 2014-04-03 DIAGNOSIS — Z1509 Genetic susceptibility to other malignant neoplasm: Secondary | ICD-10-CM | POA: Insufficient documentation

## 2014-04-03 DIAGNOSIS — Z1501 Genetic susceptibility to malignant neoplasm of breast: Secondary | ICD-10-CM | POA: Insufficient documentation

## 2014-04-03 DIAGNOSIS — Z15068 Genetic susceptibility to other malignant neoplasm of digestive system: Secondary | ICD-10-CM | POA: Insufficient documentation

## 2014-04-03 NOTE — Telephone Encounter (Signed)
Pt calling to notify us that she was told by genetics that she is gene positive. The pt couldn't tell me which one BRCA1 or BRCA2. I tried calling the Cancer Ctr to talk to one of the nurses but the Ctr was closed already for the day. The pt did tell me that she knows that she is going to need the bilateral mastectomy with ovaries removed. Pt was asking about reconstruction and I asked if she had seen a plastic surgeon to discuss her options. The pt replied she has not seen anyone about reconstruction b/c at the time she was not sure what surgery she was going to schedule. I advised pt that I would send this to Dr Donne Hazel and be back in touch with her on Monday. Pt understands.

## 2014-04-03 NOTE — Progress Notes (Signed)
Referring Provider: Rolm Bookbinder, MD    Ms. Dacruz was seen in the [facility] on 03/20/14 due to a personal and family history of breast cancer and concern regarding a hereditary predisposition to cancer in the family. Please refer to the prior Genetics clinic note for more information regarding Ms. Lope's medical and family histories and our assessment at the time.   GENETIC TESTING: At the time of Ms. Madril's visit, we recommended she pursue genetic testing of the BRCA1 and BRCA2 genes. Additional genes would be tested if this testing was normal. Her BRCA testing, which included sequencing and deletion/duplication analysis, was performed at [facility]. 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  Genetic testing also detected a Variant of Unknown Significance in the BRCA1 gene called [id number]. At this time, it is unknown if this variant is associated with increased cancer risk or if this is a normal finding, but the fact that it was detected with another pathogenic BRCA1 mutation likely means it is inconsequential. With time, we suspect the lab will determine the significance of this variant, if any. If we do learn more about it, we will try to contact Ms. Knowlton to discuss it further. However, it is important to stay in touch with [place] periodically and keep the address and phone number up to date.  MEDICAL MANAGEMENT: Women who have a BRCA mutation have an increased risk for both breast and ovarian cancer. As discussed with Ms. Haymore, to reduce the risk for breast cancer, bilateral mastectomies is the most effective option. She is currently considering this option and will discuss it further with Dr. Donne Hazel. However, for women who choose to keep their breasts, we recommend a yearly mammogram, yearly breast MRI, twice-yearly clinical breast exams through a high-risk clinic, and monthly self-breast exams.   To reduce the risk for ovarian cancer,  we recommend Ms. Vanalstine  have a prophylactic bilateral salpingo-oophorectomy. We discussed that CA-125 blood tests and transvaginal ultrasounds have not been shown to detect ovarian cancer at an early stage.  FAMILY MEMBERS: It is important that all of Ms. Urton's relatives (both men and women) know of the presence of this gene mutation. Site-specific genetic testing can sort out who in the family is at risk and who is not.   Ms. Kishi siblings and children are have a 50% chance to have inherited this mutation. However, her children are relatively young and this will not be of any consequence to them for several years. We do not test children because there is no risk to them until they are adults. We recommend they have genetic counseling and testing by the time they are in their early 20s.    SUPPORT AND RESOURCES: If Ms. Johnsen is interested in [employer] information and support, there are two groups, Facing Our Risk (www.facingourrisk.com) and Bright Pink (www.brightpink.org) which some people have found useful. They provide opportunities to speak with other individuals from high-risk families. To locate genetic counselors in other cities, visit the website of the [employer] (ArtistMovie.se) and Secretary/administrator for a Social worker by zip code.  We encouraged Ms. Archey to remain in contact with [place] on an annual basis so we can update her personal and family histories, and let her know of advances in cancer genetics that may benefit the family. Our contact number was provided. Ms. Prescott questions were answered to her satisfaction today, and she knows she is welcome to call anytime with additional questions.  Steele Berg, MS, [place] Certified Genetic Counseor phone: [phone] [email or web address]

## 2014-04-05 NOTE — Telephone Encounter (Signed)
We need to get her to [facility] or [facility] asap and then schedule. I will talk to her also but if we could get going that would be great. They let me know but I didn't get text until today.

## 2014-04-06 ENCOUNTER — Telehealth (INDEPENDENT_AMBULATORY_CARE_PROVIDER_SITE_OTHER): Payer: Self-pay

## 2014-04-06 DIAGNOSIS — C50912 Malignant neoplasm of unspecified site of left female breast: Secondary | ICD-10-CM

## 2014-04-06 NOTE — Telephone Encounter (Signed)
Appt made for this Wednesday to see  Dr Iran Planas on 04/08/14 to discuss reconstruction. Will notify Dr Donne Hazel.

## 2014-04-06 NOTE — Telephone Encounter (Signed)
Message copied by Illene Regulus on Mon Apr 06, 2014 10:53 AM ------      Message from: Rockwell [place]      Created: Mon Apr 06, 2014  9:29 AM      Regarding: Genetics       Patient is BRCA1+.  Spoke to Marion and she has spoken with patient.            Thanks,            Varney Biles ------

## 2014-04-06 NOTE — Telephone Encounter (Signed)
Referral placed for pt to see plastic surgery consult for discussion of reconstruction with surgery. Appt made for 04/08/14 with Dr Iran Planas. Pt aware.

## 2014-04-08 ENCOUNTER — Other Ambulatory Visit (INDEPENDENT_AMBULATORY_CARE_PROVIDER_SITE_OTHER): Payer: Self-pay | Admitting: General Surgery

## 2014-04-08 DIAGNOSIS — C439 Malignant melanoma of skin, unspecified: Secondary | ICD-10-CM

## 2014-04-08 DIAGNOSIS — C50112 Malignant neoplasm of central portion of left female breast: Secondary | ICD-10-CM

## 2014-04-08 NOTE — Telephone Encounter (Signed)
LMOM for pt to call me. I want to talk to the pt and touch base with her about getting scheduled for surgery.Surgical orders completed and turned into coding for schedulers to call pt.

## 2014-04-10 ENCOUNTER — Encounter (HOSPITAL_BASED_OUTPATIENT_CLINIC_OR_DEPARTMENT_OTHER): Payer: Self-pay | Admitting: *Deleted

## 2014-04-10 NOTE — Telephone Encounter (Signed)
Pt advised for an appt with Dr Donne Hazel for 04/14/14 at 8:00/8:30.

## 2014-04-10 NOTE — Progress Notes (Signed)
Seeing dr Donne Hazel 9/8-will come for preop labs  To bring all meds an overnight bag

## 2014-04-14 ENCOUNTER — Other Ambulatory Visit (INDEPENDENT_AMBULATORY_CARE_PROVIDER_SITE_OTHER): Payer: Self-pay | Admitting: General Surgery

## 2014-04-14 ENCOUNTER — Encounter (HOSPITAL_BASED_OUTPATIENT_CLINIC_OR_DEPARTMENT_OTHER)
Admission: RE | Admit: 2014-04-14 | Discharge: 2014-04-14 | Disposition: A | Discharge status: Home / Self Care | Payer: 59 | Source: Ambulatory Visit | Attending: General Surgery | Admitting: General Surgery

## 2014-04-14 DIAGNOSIS — C50919 Malignant neoplasm of unspecified site of unspecified female breast: Secondary | ICD-10-CM | POA: Diagnosis present

## 2014-04-14 DIAGNOSIS — C4359 Malignant melanoma of other part of trunk: Secondary | ICD-10-CM | POA: Diagnosis present

## 2014-04-14 DIAGNOSIS — Z1501 Genetic susceptibility to malignant neoplasm of breast: Secondary | ICD-10-CM | POA: Diagnosis not present

## 2014-04-14 DIAGNOSIS — C50912 Malignant neoplasm of unspecified site of left female breast: Secondary | ICD-10-CM

## 2014-04-14 DIAGNOSIS — C50119 Malignant neoplasm of central portion of unspecified female breast: Secondary | ICD-10-CM | POA: Diagnosis not present

## 2014-04-14 DIAGNOSIS — Z01818 Encounter for other preprocedural examination: Secondary | ICD-10-CM | POA: Insufficient documentation

## 2014-04-14 LAB — COMPREHENSIVE METABOLIC PANEL
ALT: 15 U/L (ref 0–35)
AST: 14 U/L (ref 0–37)
Albumin: 3.7 g/dL (ref 3.5–5.2)
Alkaline Phosphatase: 68 U/L (ref 39–117)
Anion gap: 14 (ref 5–15)
BUN: 9 mg/dL (ref 6–23)
CO2: 26 mEq/L (ref 19–32)
Calcium: 9 mg/dL (ref 8.4–10.5)
Chloride: 100 mEq/L (ref 96–112)
Creatinine, Ser: 0.7 mg/dL (ref 0.50–1.10)
GFR calc Af Amer: >90 mL/min (ref >90)
GFR calc non Af Amer: >90 mL/min (ref >90)
Glucose, Bld: 99 mg/dL (ref 70–99)
Potassium: 4.9 mEq/L (ref 3.7–5.3)
Sodium: 140 mEq/L (ref 137–147)
Total Bilirubin: 0.2 mg/dL — ABNORMAL LOW (ref 0.3–1.2)
Total Protein: 7.2 g/dL (ref 6.0–8.3)

## 2014-04-14 LAB — CBC WITH DIFFERENTIAL/PLATELET
Basophils Absolute: 0 10*3/uL (ref 0.0–0.1)
Basophils Relative: 1 % (ref 0–1)
Eosinophils Absolute: 0.2 10*3/uL (ref 0.0–0.7)
Eosinophils Relative: 3 % (ref 0–5)
HCT: 39.5 % (ref 36.0–46.0)
Hemoglobin: 13.2 g/dL (ref 12.0–15.0)
Lymphocytes Relative: 20 % (ref 12–46)
Lymphs Abs: 1.3 10*3/uL (ref 0.7–4.0)
MCH: 29.2 pg (ref 26.0–34.0)
MCHC: 33.4 g/dL (ref 30.0–36.0)
MCV: 87.4 fL (ref 78.0–100.0)
Monocytes Absolute: 0.3 10*3/uL (ref 0.1–1.0)
Monocytes Relative: 5 % (ref 3–12)
Neutro Abs: 4.8 10*3/uL (ref 1.7–7.7)
Neutrophils Relative %: 73 % (ref 43–77)
Platelets: 301 10*3/uL (ref 150–400)
RBC: 4.52 MIL/uL (ref 3.87–5.11)
RDW: 13.9 % (ref 11.5–15.5)
WBC: 6.6 10*3/uL (ref 4.0–10.5)

## 2014-04-14 LAB — URINALYSIS, ROUTINE W REFLEX MICROSCOPIC
Bilirubin Urine: NEGATIVE
Glucose, UA: NEGATIVE mg/dL
Ketones, ur: NEGATIVE mg/dL
Leukocytes, UA: NEGATIVE
Nitrite: NEGATIVE
Protein, ur: NEGATIVE mg/dL
Specific Gravity, Urine: 1.023 (ref 1.005–1.030)
Urobilinogen, UA: 0.2 mg/dL (ref 0.0–1.0)
pH: 5 (ref 5.0–8.0)

## 2014-04-14 LAB — URINE MICROSCOPIC-ADD ON

## 2014-04-15 LAB — CANCER ANTIGEN 27.29: CA 27.29: 7 U/mL (ref 0–39)

## 2014-04-15 NOTE — H&P (Signed)
  Subjective:    Patient ID: Krystal [name] is a 48 y.o. female.  HPI  Referred by Dr. Donne Hazel for discussion breast reconstruction. She initially presented for changing mole back. She underwent shave biopsy showing superficial spreading melanoma that is 1.7 mm, no ulceration. Following diagnosis of melanoma, she noted a left breast mass. MMG showed an irregular mass and calcs in left breast total dimension was 2.4 cm. US showed a 8 mm mass in left breast. [place] of left axilla was negative. This underwent biopsy that showed an invasive ductal carcinoma Er/PR+, Her 2 -. MRI with known malignancy in the anterior left breast 1.6 cm and additional suspicious linear clumped enhancement extends posteriorly from the mass, with total extent measuring up to 5 cm. An additional site of linear clumped enhancement in the lower far inner left breast spans a distance of 3.1 cm. She underwent genetic testing with BRCA1+; in light of planned bilateral mastectomies, no additional biopsies have been done on left post MRI.  Current 40/42 C/D. Desires similar. Wt. down 35 lb over last year by smoking per pt.   1 ppd smoker, was counseled to stop atconsult with Dr. Donne Hazel. Last cigarette yesterday.  Review of Systems  Psychiatric/Behavioral: Positive for sleep disturbance.       Objective:    Physical Exam  Constitutional: She is oriented to person, place, and time.  Cardiovascular: Normal rate and regular rhythm.  Pulmonary/Chest: Effort normal and breath sounds normal.  Abdominal: Soft.  Genitourinary: No breast discharge.  Palpable subareolar mass on left, NTTP Grade 2 ptosis bilat, striae over bilat breasts SN to nipple R 27 L 28.5 cm BW R 17 L 17 cm nipple to IMF R 11 L 13 cm  Neurological: She is alert and oriented to person, place, and time.  Psychiatric: She has a normal mood and affect.       Assessment:      L breast cancer BRCA+ Melanoma back    Obesity Plan:      Dr.  Donne Hazel plans to perform WLE back with SLN at time of breast surgery. Since last visit with him, she has had her BRCA diagnosis. We discussed immediate vs delayed, autologous vs implant based reconstruction. If she desired autologous, rec delayed reconstruction and evaluation for DIEP. Reviewed staged reconstruction over several months, expander placement and process of expansion, drains, expected hospital stay and post procedure limitations. Counseled she will need help with children and transportation post operatively. Reviewed additional procedures pending final pathology and any adjuvant treatment. Reviewed use of acellular dermis.  Counseled that smoking increases her risks of complications including mastectomy skin death, need for additional surgery, delay of expansion and adjuvant treatments. She feels she can stop presently. Given this smoking and ptotic breasts, do not feel she is good candidate for NSM. Will plan immediate reconstruction with TE acellular dermis. Pictures taken.  Irene Limbo, MD [facility] Plastic [facility] Reconstructive Surgery [phone]

## 2014-04-16 ENCOUNTER — Ambulatory Visit (HOSPITAL_BASED_OUTPATIENT_CLINIC_OR_DEPARTMENT_OTHER): Payer: 59 | Admitting: Anesthesiology

## 2014-04-16 ENCOUNTER — Encounter (HOSPITAL_COMMUNITY)
Admission: RE | Disposition: A | Discharge status: Home / Self Care | Payer: Self-pay | Source: Ambulatory Visit | Attending: General Surgery

## 2014-04-16 ENCOUNTER — Encounter (HOSPITAL_BASED_OUTPATIENT_CLINIC_OR_DEPARTMENT_OTHER): Payer: Self-pay

## 2014-04-16 ENCOUNTER — Encounter (HOSPITAL_COMMUNITY)
Admission: RE | Admit: 2014-04-16 | Discharge: 2014-04-16 | Disposition: A | Discharge status: Home / Self Care | Payer: 59 | Source: Ambulatory Visit | Attending: General Surgery | Admitting: General Surgery

## 2014-04-16 ENCOUNTER — Encounter (HOSPITAL_BASED_OUTPATIENT_CLINIC_OR_DEPARTMENT_OTHER): Payer: 59 | Admitting: Anesthesiology

## 2014-04-16 ENCOUNTER — Inpatient Hospital Stay (HOSPITAL_BASED_OUTPATIENT_CLINIC_OR_DEPARTMENT_OTHER)
Admission: RE | Admit: 2014-04-16 | Discharge: 2014-04-21 | DRG: 580 | Disposition: A | Discharge status: Home / Self Care | Payer: 59 | Source: Ambulatory Visit | Attending: General Surgery | Admitting: General Surgery

## 2014-04-16 DIAGNOSIS — C44599 Other specified malignant neoplasm of skin of other part of trunk: Secondary | ICD-10-CM | POA: Diagnosis present

## 2014-04-16 DIAGNOSIS — Z170 Estrogen receptor positive status [ER+]: Secondary | ICD-10-CM

## 2014-04-16 DIAGNOSIS — C50019 Malignant neoplasm of nipple and areola, unspecified female breast: Principal | ICD-10-CM | POA: Diagnosis present

## 2014-04-16 DIAGNOSIS — C50119 Malignant neoplasm of central portion of unspecified female breast: Secondary | ICD-10-CM | POA: Insufficient documentation

## 2014-04-16 DIAGNOSIS — C50919 Malignant neoplasm of unspecified site of unspecified female breast: Secondary | ICD-10-CM | POA: Diagnosis present

## 2014-04-16 DIAGNOSIS — Z6841 Body Mass Index (BMI) 40.0 and over, adult: Secondary | ICD-10-CM

## 2014-04-16 DIAGNOSIS — E669 Obesity, unspecified: Secondary | ICD-10-CM | POA: Diagnosis present

## 2014-04-16 DIAGNOSIS — C50912 Malignant neoplasm of unspecified site of left female breast: Secondary | ICD-10-CM

## 2014-04-16 DIAGNOSIS — C50112 Malignant neoplasm of central portion of left female breast: Secondary | ICD-10-CM

## 2014-04-16 DIAGNOSIS — F17200 Nicotine dependence, unspecified, uncomplicated: Secondary | ICD-10-CM | POA: Diagnosis present

## 2014-04-16 DIAGNOSIS — M79609 Pain in unspecified limb: Secondary | ICD-10-CM | POA: Diagnosis present

## 2014-04-16 DIAGNOSIS — Z1501 Genetic susceptibility to malignant neoplasm of breast: Secondary | ICD-10-CM

## 2014-04-16 DIAGNOSIS — C439 Malignant melanoma of skin, unspecified: Secondary | ICD-10-CM

## 2014-04-16 DIAGNOSIS — C44509 Unspecified malignant neoplasm of skin of other part of trunk: Secondary | ICD-10-CM | POA: Diagnosis present

## 2014-04-16 HISTORY — PX: BREAST RECONSTRUCTION WITH PLACEMENT OF TISSUE EXPANDER AND FLEX HD (ACELLULAR HYDRATED DERMIS): SHX6295

## 2014-04-16 HISTORY — PX: MELANOMA EXCISION: SHX5266

## 2014-04-16 HISTORY — DX: Other complications of anesthesia, initial encounter: T88.59XA

## 2014-04-16 HISTORY — DX: Adverse effect of unspecified anesthetic, initial encounter: T41.45XA

## 2014-04-16 HISTORY — PX: SIMPLE MASTECTOMY WITH AXILLARY SENTINEL NODE BIOPSY: SHX6098

## 2014-04-16 SURGERY — EXCISION, MELANOMA
Anesthesia: General | Site: Breast

## 2014-04-16 MED ORDER — DIAZEPAM 5 MG PO TABS
5.0000 mg | ORAL_TABLET | Freq: Three times a day (TID) | ORAL | Status: DC | PRN
  Administered 2014-04-16 – 2014-04-18 (×3): 5 mg via ORAL
  Filled 2014-04-16 (×3): qty 1

## 2014-04-16 MED ORDER — FENTANYL CITRATE 0.05 MG/ML IJ SOLN
INTRAMUSCULAR | Status: DC | PRN
  Administered 2014-04-16: 25 ug via INTRAVENOUS
  Administered 2014-04-16: 50 ug via INTRAVENOUS
  Administered 2014-04-16: 25 ug via INTRAVENOUS
  Administered 2014-04-16: 100 ug via INTRAVENOUS
  Administered 2014-04-16 (×4): 25 ug via INTRAVENOUS

## 2014-04-16 MED ORDER — FENTANYL CITRATE 0.05 MG/ML IJ SOLN
INTRAMUSCULAR | Status: AC
  Filled 2014-04-16: qty 8

## 2014-04-16 MED ORDER — CEFAZOLIN SODIUM-DEXTROSE 2-3 GM-% IV SOLR
2.0000 g | INTRAVENOUS | Status: AC
  Administered 2014-04-16: 2 g via INTRAVENOUS

## 2014-04-16 MED ORDER — CEFAZOLIN SODIUM-DEXTROSE 2-3 GM-% IV SOLR
INTRAVENOUS | Status: AC
  Filled 2014-04-16: qty 50

## 2014-04-16 MED ORDER — FENTANYL CITRATE 0.05 MG/ML IJ SOLN
50.0000 ug | INTRAMUSCULAR | Status: DC | PRN
  Administered 2014-04-16 (×2): 100 ug via INTRAVENOUS
  Administered 2014-04-16 (×2): 50 ug via INTRAVENOUS

## 2014-04-16 MED ORDER — BUPIVACAINE HCL (PF) 0.25 % IJ SOLN
INTRAMUSCULAR | Status: AC
  Filled 2014-04-16: qty 30

## 2014-04-16 MED ORDER — 0.9 % SODIUM CHLORIDE (POUR BTL) OPTIME
TOPICAL | Status: DC | PRN
  Administered 2014-04-16: 1000 mL

## 2014-04-16 MED ORDER — HYDROMORPHONE HCL PF 1 MG/ML IJ SOLN
INTRAMUSCULAR | Status: AC
  Filled 2014-04-16: qty 1

## 2014-04-16 MED ORDER — SODIUM CHLORIDE 0.9 % IV SOLN
INTRAVENOUS | Status: DC
  Administered 2014-04-16: 17:00:00 via INTRAVENOUS

## 2014-04-16 MED ORDER — PROPOFOL 10 MG/ML IV BOLUS
INTRAVENOUS | Status: DC | PRN
  Administered 2014-04-16: 40 mg via INTRAVENOUS
  Administered 2014-04-16: 200 mg via INTRAVENOUS

## 2014-04-16 MED ORDER — MIDAZOLAM HCL 2 MG/2ML IJ SOLN
INTRAMUSCULAR | Status: AC
  Filled 2014-04-16: qty 2

## 2014-04-16 MED ORDER — GENTAMICIN SULFATE 40 MG/ML IJ SOLN
INTRAMUSCULAR | Status: DC | PRN
  Administered 2014-04-16: 14:00:00

## 2014-04-16 MED ORDER — METHYLENE BLUE 1 % INJ SOLN
INTRAMUSCULAR | Status: DC | PRN
  Administered 2014-04-16: 11:00:00 via INTRAMUSCULAR

## 2014-04-16 MED ORDER — CEFAZOLIN SODIUM-DEXTROSE 2-3 GM-% IV SOLR
2.0000 g | Freq: Three times a day (TID) | INTRAVENOUS | Status: AC
  Administered 2014-04-16 – 2014-04-17 (×3): 2 g via INTRAVENOUS
  Filled 2014-04-16 (×2): qty 50

## 2014-04-16 MED ORDER — BACITRACIN ZINC 500 UNIT/GM EX OINT
TOPICAL_OINTMENT | CUTANEOUS | Status: AC
  Filled 2014-04-16: qty 28.35

## 2014-04-16 MED ORDER — LIDOCAINE HCL (PF) 1 % IJ SOLN
INTRAMUSCULAR | Status: AC
  Filled 2014-04-16: qty 30

## 2014-04-16 MED ORDER — BACITRACIN ZINC 500 UNIT/GM EX OINT
TOPICAL_OINTMENT | CUTANEOUS | Status: AC
  Filled 2014-04-16: qty 0.9

## 2014-04-16 MED ORDER — ONDANSETRON HCL 4 MG/2ML IJ SOLN
4.0000 mg | Freq: Four times a day (QID) | INTRAMUSCULAR | Status: DC | PRN
  Administered 2014-04-16 – 2014-04-18 (×4): 4 mg via INTRAVENOUS
  Filled 2014-04-16 (×3): qty 2

## 2014-04-16 MED ORDER — FENTANYL CITRATE 0.05 MG/ML IJ SOLN
INTRAMUSCULAR | Status: AC
  Filled 2014-04-16: qty 2

## 2014-04-16 MED ORDER — HYDROMORPHONE HCL PF 1 MG/ML IJ SOLN
1.0000 mg | INTRAMUSCULAR | Status: DC | PRN
  Administered 2014-04-17 – 2014-04-19 (×11): 1 mg via INTRAVENOUS
  Filled 2014-04-16 (×12): qty 1

## 2014-04-16 MED ORDER — PROPOFOL 10 MG/ML IV BOLUS
INTRAVENOUS | Status: AC
  Filled 2014-04-16: qty 20

## 2014-04-16 MED ORDER — FENTANYL CITRATE 0.05 MG/ML IJ SOLN
25.0000 ug | INTRAMUSCULAR | Status: DC | PRN
  Administered 2014-04-16 (×3): 50 ug via INTRAVENOUS

## 2014-04-16 MED ORDER — METHYLENE BLUE 1 % INJ SOLN
INTRAMUSCULAR | Status: AC
  Filled 2014-04-16: qty 10

## 2014-04-16 MED ORDER — EPHEDRINE SULFATE 50 MG/ML IJ SOLN
INTRAMUSCULAR | Status: DC | PRN
  Administered 2014-04-16 (×2): 10 mg via INTRAVENOUS

## 2014-04-16 MED ORDER — MIDAZOLAM HCL 2 MG/2ML IJ SOLN
0.5000 mg | Freq: Once | INTRAMUSCULAR | Status: AC | PRN
  Administered 2014-04-16: 1 mg via INTRAVENOUS

## 2014-04-16 MED ORDER — HYDROMORPHONE HCL PF 1 MG/ML IJ SOLN
0.2500 mg | INTRAMUSCULAR | Status: DC | PRN
  Administered 2014-04-16 (×3): 0.5 mg via INTRAVENOUS

## 2014-04-16 MED ORDER — MIDAZOLAM HCL 2 MG/2ML IJ SOLN
INTRAMUSCULAR | Status: AC
  Filled 2014-04-16: qty 4

## 2014-04-16 MED ORDER — FENTANYL CITRATE 0.05 MG/ML IJ SOLN
INTRAMUSCULAR | Status: AC
  Filled 2014-04-16: qty 4

## 2014-04-16 MED ORDER — GLYCOPYRROLATE 0.2 MG/ML IJ SOLN
INTRAMUSCULAR | Status: DC | PRN
  Administered 2014-04-16: 0.4 mg via INTRAVENOUS

## 2014-04-16 MED ORDER — SCOPOLAMINE 1 MG/3DAYS TD PT72
MEDICATED_PATCH | TRANSDERMAL | Status: DC | PRN
  Administered 2014-04-16: 1 via TRANSDERMAL

## 2014-04-16 MED ORDER — ZOLPIDEM TARTRATE 10 MG PO TABS: 10.0000 mg | ORAL_TABLET | Freq: Every evening | ORAL | Status: DC | PRN

## 2014-04-16 MED ORDER — ACETAMINOPHEN 325 MG PO TABS
650.0000 mg | ORAL_TABLET | Freq: Four times a day (QID) | ORAL | Status: DC | PRN
  Administered 2014-04-18: 650 mg via ORAL
  Filled 2014-04-16: qty 2

## 2014-04-16 MED ORDER — OXYCODONE HCL 5 MG PO TABS
5.0000 mg | ORAL_TABLET | ORAL | Status: DC | PRN
  Administered 2014-04-16: 5 mg via ORAL
  Administered 2014-04-17 – 2014-04-18 (×6): 10 mg via ORAL
  Filled 2014-04-16 (×7): qty 2
  Filled 2014-04-16: qty 1

## 2014-04-16 MED ORDER — LACTATED RINGERS IV SOLN
INTRAVENOUS | Status: DC
  Administered 2014-04-16 (×4): via INTRAVENOUS

## 2014-04-16 MED ORDER — TECHNETIUM TC 99M SULFUR COLLOID FILTERED
0.5000 | Freq: Once | INTRAVENOUS | Status: AC | PRN
  Administered 2014-04-16: 0.5 via INTRADERMAL

## 2014-04-16 MED ORDER — DEXAMETHASONE SODIUM PHOSPHATE 4 MG/ML IJ SOLN
INTRAMUSCULAR | Status: DC | PRN
  Administered 2014-04-16: 10 mg via INTRAVENOUS

## 2014-04-16 MED ORDER — SCOPOLAMINE 1 MG/3DAYS TD PT72
MEDICATED_PATCH | TRANSDERMAL | Status: AC
  Filled 2014-04-16: qty 1

## 2014-04-16 MED ORDER — SODIUM CHLORIDE 0.9 % IJ SOLN
INTRAMUSCULAR | Status: AC
  Filled 2014-04-16: qty 10

## 2014-04-16 MED ORDER — ACETAMINOPHEN 650 MG RE SUPP: 650.0000 mg | Freq: Four times a day (QID) | RECTAL | Status: DC | PRN

## 2014-04-16 MED ORDER — LIDOCAINE HCL (CARDIAC) 10 MG/ML IV SOLN
INTRAVENOUS | Status: DC | PRN
  Administered 2014-04-16: 75 mg via INTRAVENOUS

## 2014-04-16 MED ORDER — ONDANSETRON HCL 4 MG/2ML IJ SOLN
4.0000 mg | Freq: Once | INTRAMUSCULAR | Status: AC | PRN
  Filled 2014-04-16: qty 2

## 2014-04-16 MED ORDER — ONDANSETRON HCL 4 MG/2ML IJ SOLN
INTRAMUSCULAR | Status: DC | PRN
  Administered 2014-04-16: 4 mg via INTRAVENOUS

## 2014-04-16 MED ORDER — ROCURONIUM BROMIDE 100 MG/10ML IV SOLN
INTRAVENOUS | Status: DC | PRN
  Administered 2014-04-16 (×2): 20 mg via INTRAVENOUS
  Administered 2014-04-16: 40 mg via INTRAVENOUS

## 2014-04-16 MED ORDER — MIDAZOLAM HCL 2 MG/2ML IJ SOLN
1.0000 mg | INTRAMUSCULAR | Status: DC | PRN
  Administered 2014-04-16: 1 mg via INTRAVENOUS
  Administered 2014-04-16 (×2): 2 mg via INTRAVENOUS
  Administered 2014-04-16: 1 mg via INTRAVENOUS

## 2014-04-16 MED ORDER — NEOSTIGMINE METHYLSULFATE 10 MG/10ML IV SOLN
INTRAVENOUS | Status: DC | PRN
  Administered 2014-04-16: 3 mg via INTRAVENOUS

## 2014-04-16 MED ORDER — TECHNETIUM TC 99M SULFUR COLLOID FILTERED
1.0000 | Freq: Once | INTRAVENOUS | Status: AC | PRN
  Administered 2014-04-16: 1 via INTRADERMAL

## 2014-04-16 SURGICAL SUPPLY — 97 items
ADH SKN CLS APL DERMABOND .7 (GAUZE/BANDAGES/DRESSINGS) ×4
APL SKNCLS STERI-STRIP NONHPOA (GAUZE/BANDAGES/DRESSINGS)
APPLIER CLIP 11 MED OPEN (CLIP)
APPLIER CLIP 9.375 MED OPEN (MISCELLANEOUS)
APR CLP MED 11 20 MLT OPN (CLIP)
APR CLP MED 9.3 20 MLT OPN (MISCELLANEOUS)
BAG DECANTER FOR FLEXI CONT (MISCELLANEOUS) ×3 IMPLANT
BENZOIN TINCTURE PRP APPL 2/3 (GAUZE/BANDAGES/DRESSINGS) ×2 IMPLANT
BINDER BREAST LRG (GAUZE/BANDAGES/DRESSINGS) IMPLANT
BINDER BREAST MEDIUM (GAUZE/BANDAGES/DRESSINGS) IMPLANT
BINDER BREAST XLRG (GAUZE/BANDAGES/DRESSINGS) ×1 IMPLANT
BINDER BREAST XXLRG (GAUZE/BANDAGES/DRESSINGS) IMPLANT
BLADE CLIPPER SURG (BLADE) IMPLANT
BLADE HEX COATED 2.75 (ELECTRODE) ×4 IMPLANT
BLADE SURG 10 STRL SS (BLADE) ×3 IMPLANT
BLADE SURG 15 STRL LF DISP TIS (BLADE) ×4 IMPLANT
BLADE SURG 15 STRL SS (BLADE) ×6
BNDG GAUZE ELAST 4 BULKY (GAUZE/BANDAGES/DRESSINGS) ×6 IMPLANT
CANISTER SUCT 1200ML W/VALVE (MISCELLANEOUS) ×6 IMPLANT
CHLORAPREP W/TINT 26ML (MISCELLANEOUS) ×4 IMPLANT
CLIP APPLIE 11 MED OPEN (CLIP) IMPLANT
CLIP APPLIE 9.375 MED OPEN (MISCELLANEOUS) IMPLANT
COVER MAYO STAND STRL (DRAPES) ×4 IMPLANT
COVER PROBE W GEL 5X96 (DRAPES) ×3 IMPLANT
COVER TABLE BACK 60X90 (DRAPES) ×4 IMPLANT
DECANTER SPIKE VIAL GLASS SM (MISCELLANEOUS) ×3 IMPLANT
DERMABOND ADVANCED (GAUZE/BANDAGES/DRESSINGS) ×2
DERMABOND ADVANCED .7 DNX12 (GAUZE/BANDAGES/DRESSINGS) ×6 IMPLANT
DRAIN CHANNEL 15F RND FF W/TCR (WOUND CARE) ×4 IMPLANT
DRAIN CHANNEL 19F RND (DRAIN) ×2 IMPLANT
DRAPE LAPAROSCOPIC ABDOMINAL (DRAPES) ×3 IMPLANT
DRAPE PED LAPAROTOMY (DRAPES) ×3 IMPLANT
DRSG PAD ABDOMINAL 8X10 ST (GAUZE/BANDAGES/DRESSINGS) ×12 IMPLANT
DRSG TEGADERM 4X10 (GAUZE/BANDAGES/DRESSINGS) IMPLANT
DRSG TEGADERM 4X4.75 (GAUZE/BANDAGES/DRESSINGS) ×5 IMPLANT
ELECT BLADE 4.0 EZ CLEAN MEGAD (MISCELLANEOUS) ×3
ELECT REM PT RETURN 9FT ADLT (ELECTROSURGICAL) ×3
ELECTRODE BLDE 4.0 EZ CLN MEGD (MISCELLANEOUS) ×2 IMPLANT
ELECTRODE REM PT RTRN 9FT ADLT (ELECTROSURGICAL) ×2 IMPLANT
EVACUATOR SILICONE 100CC (DRAIN) ×6 IMPLANT
GLOVE BIO SURGEON STRL SZ 6 (GLOVE) ×4 IMPLANT
GLOVE BIO SURGEON STRL SZ 6.5 (GLOVE) ×3 IMPLANT
GLOVE BIO SURGEON STRL SZ7 (GLOVE) ×5 IMPLANT
GLOVE BIOGEL M 7.0 STRL (GLOVE) ×5 IMPLANT
GLOVE BIOGEL PI IND STRL 7.5 (GLOVE) ×2 IMPLANT
GLOVE BIOGEL PI INDICATOR 7.5 (GLOVE) ×8
GOWN STRL REUS W/ TWL LRG LVL3 (GOWN DISPOSABLE) ×6 IMPLANT
GOWN STRL REUS W/TWL LRG LVL3 (GOWN DISPOSABLE) ×15
GRAFT FLEX HD BILAT 4X16 THICK (Tissue Mesh) ×1 IMPLANT
IMPL BREAST TIS EXP M 450CC (Breast) IMPLANT
IMPLANT BREAST TIS EXP M 450CC (Breast) ×6 IMPLANT
IV NS 500ML (IV SOLUTION)
IV NS 500ML BAXH (IV SOLUTION) ×2 IMPLANT
KIT FILL SYSTEM UNIVERSAL (SET/KITS/TRAYS/PACK) ×3 IMPLANT
NDL HYPO 25X1 1.5 SAFETY (NEEDLE) ×4 IMPLANT
NDL SAFETY ECLIPSE 18X1.5 (NEEDLE) ×2 IMPLANT
NEEDLE HYPO 18GX1.5 SHARP (NEEDLE) ×3
NEEDLE HYPO 25X1 1.5 SAFETY (NEEDLE) ×6 IMPLANT
NS IRRIG 1000ML POUR BTL (IV SOLUTION) ×3 IMPLANT
PACK BASIN DAY SURGERY FS (CUSTOM PROCEDURE TRAY) ×4 IMPLANT
PENCIL BUTTON HOLSTER BLD 10FT (ELECTRODE) ×4 IMPLANT
PIN SAFETY STERILE (MISCELLANEOUS) ×3 IMPLANT
SHEET MEDIUM DRAPE 40X70 STRL (DRAPES) ×2 IMPLANT
SLEEVE SCD COMPRESS KNEE MED (MISCELLANEOUS) ×3 IMPLANT
SLEEVE SURGEON STRL (DRAPES) ×1 IMPLANT
SPONGE GAUZE 4X4 12PLY STER LF (GAUZE/BANDAGES/DRESSINGS) ×3 IMPLANT
SPONGE LAP 18X18 X RAY DECT (DISPOSABLE) ×7 IMPLANT
SPONGE LAP 4X18 X RAY DECT (DISPOSABLE) ×3 IMPLANT
STAPLER VISISTAT 35W (STAPLE) ×4 IMPLANT
STOCKINETTE IMPERVIOUS LG (DRAPES) IMPLANT
STRIP CLOSURE SKIN 1/2X4 (GAUZE/BANDAGES/DRESSINGS) ×2 IMPLANT
SUT ETHILON 2 0 FS 18 (SUTURE) ×8 IMPLANT
SUT MNCRL AB 4-0 PS2 18 (SUTURE) ×4 IMPLANT
SUT MON AB 5-0 PS2 18 (SUTURE) IMPLANT
SUT SILK 2 0 FS (SUTURE) ×3 IMPLANT
SUT SILK 2 0 SH (SUTURE) ×2 IMPLANT
SUT VIC AB 2-0 SH 18 (SUTURE) ×2 IMPLANT
SUT VIC AB 2-0 SH 27 (SUTURE) ×3
SUT VIC AB 2-0 SH 27XBRD (SUTURE) IMPLANT
SUT VIC AB 3-0 54X BRD REEL (SUTURE) IMPLANT
SUT VIC AB 3-0 BRD 54 (SUTURE)
SUT VIC AB 3-0 PS1 18 (SUTURE)
SUT VIC AB 3-0 PS1 18XBRD (SUTURE) ×2 IMPLANT
SUT VIC AB 3-0 SH 27 (SUTURE) ×15
SUT VIC AB 3-0 SH 27X BRD (SUTURE) IMPLANT
SUT VICRYL 3-0 CR8 SH (SUTURE) ×2 IMPLANT
SUT VICRYL 4-0 PS2 18IN ABS (SUTURE) ×7 IMPLANT
SUT VICRYL AB 3 0 TIES (SUTURE) IMPLANT
SYR 50ML LL SCALE MARK (SYRINGE) ×1 IMPLANT
SYR BULB IRRIGATION 50ML (SYRINGE) ×3 IMPLANT
SYR CONTROL 10ML LL (SYRINGE) ×5 IMPLANT
TOWEL OR 17X24 6PK STRL BLUE (TOWEL DISPOSABLE) ×11 IMPLANT
TOWEL OR NON WOVEN STRL DISP B (DISPOSABLE) ×2 IMPLANT
TUBE CONNECTING 20X1/4 (TUBING) ×8 IMPLANT
UNDERPAD 30X30 INCONTINENT (UNDERPADS AND DIAPERS) ×6 IMPLANT
VAC PENCILS W/TUBING CLEAR (MISCELLANEOUS) ×2 IMPLANT
YANKAUER SUCT BULB TIP NO VENT (SUCTIONS) ×6 IMPLANT

## 2014-04-16 NOTE — Anesthesia Preprocedure Evaluation (Signed)
Anesthesia Evaluation  Patient identified by MRN, date of birth, ID band Patient awake    Reviewed: Allergy & Precautions, H&P , NPO status   Airway Mallampati: II TM Distance: >3 FB Neck ROM: Full    Dental  (+) Teeth Intact, Dental Advisory Given   Pulmonary Current Smoker,  breath sounds clear to auscultation        Cardiovascular Rhythm:Regular Rate:Normal     Neuro/Psych    GI/Hepatic   Endo/Other  diabetes  Renal/GU      Musculoskeletal   Abdominal   Peds  Hematology   Anesthesia Other Findings   Reproductive/Obstetrics                           Anesthesia Physical Anesthesia Plan  ASA: II  Anesthesia Plan: General   Post-op Pain Management:    Induction: Intravenous  Airway Management Planned: Oral ETT  Additional Equipment:   Intra-op Plan:   Post-operative Plan: Extubation in OR  Informed Consent: I have reviewed the patients History and Physical, chart, labs and discussed the procedure including the risks, benefits and alternatives for the proposed anesthesia with the patient or authorized representative who has indicated his/her understanding and acceptance.   Dental advisory given  Plan Discussed with: CRNA and Anesthesiologist  Anesthesia Plan Comments:         Anesthesia Quick Evaluation

## 2014-04-16 NOTE — Progress Notes (Signed)
Patient experienced apneic episode without spontaneous respirations.  Stimulation, chin lift and jaw thrust, bag valve mask initiated. Anesthesia called to bedside. Dr Ola Spurr, anesthesiologist, at bedside. Continued to bagging patient until nasal trumpet placed by Dr Ola Spurr into right nare. Upon insertion patient experienced return of spontaneous respirations. VSS at that time.

## 2014-04-16 NOTE — Progress Notes (Signed)
Nuc med injection completed by radiology staff. No additional medications given. Pt tol well, see doc flowsheets. Will invite family back to visit pt again.

## 2014-04-16 NOTE — Op Note (Addendum)
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 Serita Grammes Asst: Dr Irene Limbo Anesthesia: Gen. With bilateral pectoral blocks Drains: Per plastic surgery Estimated blood loss: 601 cc Complications: None Disposition case turned over to plastic surgery for reconstruction Specimens: #1 15 x 6 cm wide local excision of back melanoma marked short superior, long lateral #2 right skin sparing mastectomy marked short stitch superior, long stitch lateral #3 left skin sparing mastectomy marked short stitch superior, long stitch lateral #3 left axillary sentinel node x4 with counts ranging from 79-1411  Indications: This is a 48 yo old female who had a left breast cancer identified.This appeared to be a clinical stage II breast cancer. She also had a changing nevus on her back that underwent biopsy and was a 1.7 mm thick melanoma. I did lymphoscintigraphy and this packed to the left axilla. She underwent genetic testing and also has a BRCA1 mutation. We discussed a number of options and decided to proceed with bilateral skin sparing mastectomies and immediate expander reconstruction along with a left axillary sentinel lymph node biopsy for both her melanoma and her breast cancer. This would be done in combination with a wide local excision of her melanoma.  Procedure: After informed consent was obtained she was brought to day surgery. She was given 2 g of cefazolin. She underwent bilateral pectoral blocks. She had technetium injected into her back around the melanoma as well as in the breast in a periareolar fashion. She was then brought to the  operating room and placed under general anesthesia without complication. She was rolled into the prone position and appropriately padded. She was then prepped and draped in the standard sterile surgical fashion. A surgical timeout was then performed.  I measured 2 cm from the melanoma in each direction. I injected blue dye all around the melanoma and massaged this.It was really just a shave biopsy site that was evident at this point. I then made a 15 x 6 cm ellipse of tissue that I took down to the fascia. This was excised in total and marked as above. Hemostasis was obtained. I raised flaps a little bit in each direction and was able to close this in several layers with 2-0 Vicryl and externally with 2-0 nylon suture. Bacitracin and a sterile dressing were then placed. She was then rolled back into the supine position.  She was then prepped and draped again in the standard sterile surgical fashion. A surgical timeout was also performed.I then proceeded with the prophylactic right mastectomy. This was done in a skin sparing fashion. I made an elliptical incision around the nipple areolar complex. I then created flaps to the sternum, inframammary crease, clavicle, and latissimus laterally. The breast was then removed from the pectoralis muscle including the fascia. This was then marked as above and passed off the table. Hemostasis was obtained. I then packed a moist sponge in this.  I then created a similar incision encompassing the nipple and areola on the left side in an effort to save the skin for reconstruction. I then created similar flaps. I then removed the breast in a similar fashion. This was marked as above. I then used a  neoprobe to identify four sentinel lymph nodes. There was no background radioactivity. These nodes were all excised. There were no blue nodes that were noted either. Hemostasis was then obtained. I then turned the case over to plastic surgery for reconstruction.

## 2014-04-16 NOTE — Anesthesia Procedure Notes (Addendum)
Anesthesia Regional Block:  Pectoralis block  Pre-Anesthetic Checklist: ,, timeout performed, Correct Patient, Correct Site, Correct Laterality, Correct Procedure, Correct Position, site marked, Risks and benefits discussed,  Surgical consent,  Pre-op evaluation,  At surgeon's request and post-op pain management  Laterality: Right  Prep: chloraprep       Needles:  Injection technique: Single-shot  Needle Type: Echogenic Stimulator Needle     Needle Length: 9cm 9 cm Needle Gauge: 22 and 22 G    Additional Needles:  Procedures: ultrasound guided (picture in chart) Pectoralis block Narrative:  Start time: 04/16/2014 9:45 AM End time: 04/16/2014 9:50 AM Injection made incrementally with aspirations every 5 mL.  Performed by: Personally   Additional Notes: 25 cc 0.5% Marcaine with 1:200 Epi injected easily   Anesthesia Regional Block:  Pectoralis block  Pre-Anesthetic Checklist: ,, timeout performed, Correct Patient, Correct Site, Correct Laterality, Correct Procedure, Correct Position, site marked, Risks and benefits discussed,  Surgical consent,  Pre-op evaluation,  At surgeon's request and post-op pain management  Laterality: Left  Prep: chloraprep       Needles:  Injection technique: Single-shot  Needle Type: Echogenic Stimulator Needle     Needle Length: 9cm 9 cm Needle Gauge: 22 and 22 G    Additional Needles:  Procedures: ultrasound guided (picture in chart) Pectoralis block Narrative:  Start time: 04/16/2014 9:50 AM End time: 04/16/2014 9:55 AM Injection made incrementally with aspirations every 5 mL.  Performed by: Personally   Additional Notes: 25 cc 0.5% marcaine with 1:200 epi injected easily   Procedure Name: Intubation Date/Time: 04/16/2014 10:33 AM Performed by: Lyndee Leo Pre-anesthesia Checklist: Patient identified, Emergency Drugs available, Suction available and Patient being monitored Patient Re-evaluated:Patient Re-evaluated prior  to inductionOxygen Delivery Method: Circle System Utilized Preoxygenation: Pre-oxygenation with 100% oxygen Intubation Type: IV induction Ventilation: Mask ventilation without difficulty Laryngoscope Size: Mac and 3 Grade View: Grade II Tube type: Oral Tube size: 7.0 mm Number of attempts: 1 Airway Equipment and Method: stylet and oral airway Placement Confirmation: ETT inserted through vocal cords under direct vision,  positive ETCO2 and breath sounds checked- equal and bilateral Secured at: 21 cm Tube secured with: Tape Dental Injury: Teeth and Oropharynx as per pre-operative assessment

## 2014-04-16 NOTE — H&P (Signed)
HPI  89 yof who presents after initially undergoing evaluation with Dr Allyson Sabal as she was told she had a changing mole in her upper midback. She had numerous significant sun exposures as a kid. This underwent shave biopsy that shows a superficial spreading melanoma that is 1.7 mm thick. There is no ulceration. The margins are mostly clear right now. MI is 1 per sq mm. No LVI. There is partial regression noted. She has never had a prior mm and the melanoma concerned her. She was doing a breast exam and found a left breast mass. She then went and got a mm that showed an irregular mass and calcs in left breast total dimension was 2.4 cm. US showed a 8 mm mass in left breast. [place] of left axilla was negative. 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 This is grade I-II. Her genetic testing has also shown her to have deleterious brca mutation.    Diagnosis  Date   .  Gestational diabetes     Past Surgical History   Procedure  Laterality  Date   .  Cholecystectomy       1195   .  Cesarean section   1999   .  Cesarean section   2001   .  Cesarean section   2005    Family History   Problem  Relation  Age of Onset   .  Cancer  Paternal Aunt    .  Cancer  Paternal Grandmother    Social History  History   Substance Use Topics   .  Smoking status:  Former Research scientist (life sciences)   .  Smokeless tobacco:  Not on file   .  Alcohol Use:  No    Allergies   Allergen  Reactions   .  Codeine  Nausea And Vomiting    Current Outpatient Prescriptions   Medication  Sig  Dispense  Refill   .  diphenhydramine-acetaminophen (TYLENOL PM) 25-500 MG TABS  Take 2 tablets by mouth at bedtime as needed. To help sleep for pain.      No current facility-administered medications for this visit.   Review of Systems  Review of Systems  Constitutional: Negative for fever, chills and unexpected weight change.  HENT: Negative for congestion,  hearing loss, sore throat, trouble swallowing and voice change.  Eyes: Negative for visual disturbance.  Respiratory: Positive for cough. Negative for wheezing.  Cardiovascular: Positive for palpitations. Negative for chest pain and leg swelling.  Gastrointestinal: Negative for nausea, vomiting, abdominal pain, diarrhea, constipation, blood in stool, abdominal distention and anal bleeding.  Genitourinary: Negative for hematuria, vaginal bleeding and difficulty urinating.  Musculoskeletal: Negative for arthralgias.  Skin: Negative for rash and wound.  Neurological: Positive for headaches. Negative for seizures and syncope.  Hematological: Negative for adenopathy. Does not bruise/bleed easily.  Psychiatric/Behavioral: Negative for confusion.  Blood pressure 110/78, pulse 80, resp. rate 18, height 5' 4" (1.626 m), weight 231 lb (104.781 kg).  Physical Exam  Physical Exam  Vitals reviewed.  Constitutional: She appears well-developed and well-nourished.  Eyes: No scleral icterus.  Neck: Neck supple.  Cardiovascular: Normal rate, regular rhythm and normal heart sounds.  Pulmonary/Chest: Effort normal and breath sounds normal. She has no wheezes. She has no rales. Right breast exhibits no inverted nipple, no mass, no nipple discharge, no skin change and no tenderness. Left breast exhibits mass (near nac but difficult to  tell right now due to tenderness and hematoma). Left breast exhibits no inverted nipple, no nipple discharge, no skin change and no tenderness.  Lymphadenopathy:  She has no cervical adenopathy.  She has no axillary adenopathy.  Right: No supraclavicular adenopathy present.  Left: No supraclavicular adenopathy present.  Data Reviewed  [place] and pathology reviewed  Assessment  Clinical stage II left breast cancer  T2 mid back melanoma   Plan  1. Breast cancer  Bilateral skin sparing mastectomy with left axillary sentinel node biopsy 2. Melanoma  WLE with 2 cm margin, left  axillary sentinel node biopsy

## 2014-04-16 NOTE — Progress Notes (Signed)
Assisted Dr. Linna Caprice with right and left, pectoralis blocks. Side rails up, monitors on throughout procedure. See vital signs in flow sheet. Tolerated Procedure well.

## 2014-04-16 NOTE — Interval H&P Note (Signed)
History and Physical Interval Note:  04/16/2014 6:34 AM  Tate Tiley  has presented today for surgery, with the diagnosis of left breast cancer, back melanoma, brca 1 positive  The various methods of treatment have been discussed with the patient and family. After consideration of risks, benefits and other options for treatment, the patient has consented to  Procedure(s): WIDE LOCAL EXCISION OF BACK MELANOMA (N/A) BILATEAL SKIN SPARING  MASTECTOMIES WITH LEFT AXILLARY SENTINEL NODE BIOPSY (N/A) BILATERAL BREAST RECONSTRUCTION WITH PLACEMENT OF TISSUE EXPANDER AND FLEX HD (ACELLULAR HYDRATED DERMIS) (Bilateral) as a surgical intervention .  The patient's history has been reviewed, patient examined, no change in status, stable for surgery.  I have reviewed the patient's chart and labs.  Questions were answered to the patient's satisfaction.     THIMMAPPA, BRINDA   

## 2014-04-16 NOTE — Transfer of Care (Signed)
Immediate Anesthesia Transfer of Care Note  Patient: Krystal [name]  Procedure(s) Performed: Procedure(s): WIDE LOCAL EXCISION OF BACK MELANOMA (N/A) BILATERAL SKIN SPARING  MASTECTOMIES WITH LEFT AXILLARY SENTINEL NODE BIOPSY (Bilateral) BILATERAL BREAST RECONSTRUCTION WITH PLACEMENT OF TISSUE EXPANDER AND FLEX HD (ACELLULAR HYDRATED DERMIS) (Bilateral)  Patient Location: PACU  Anesthesia Type:GA combined with regional for post-op pain  Level of Consciousness: sedated and lethargic  Airway & Oxygen Therapy: Patient Spontanous Breathing and Patient connected to face mask oxygen  Post-op Assessment: Report given to PACU RN and Post -op Vital signs reviewed and stable  Post vital signs: Reviewed and stable  Complications: No apparent anesthesia complications

## 2014-04-16 NOTE — Anesthesia Postprocedure Evaluation (Signed)
  Anesthesia Post-op Note  Patient: Krystal [name]  Procedure(s) Performed: Procedure(s): WIDE LOCAL EXCISION OF BACK MELANOMA (N/A) BILATERAL SKIN SPARING  MASTECTOMIES WITH LEFT AXILLARY SENTINEL NODE BIOPSY (Bilateral) BILATERAL BREAST RECONSTRUCTION WITH PLACEMENT OF TISSUE EXPANDER AND FLEX HD (ACELLULAR HYDRATED DERMIS) (Bilateral)  Patient Location: PACU  Anesthesia Type:General and block  Level of Consciousness: awake and alert   Airway and Oxygen Therapy: Patient Spontanous Breathing  Post-op Pain: mild  Post-op Assessment: Post-op Vital signs reviewed, Patient's Cardiovascular Status Stable and Respiratory Function Stable  Post-op Vital Signs: Reviewed  Filed Vitals:   04/16/14 1700  BP: 125/69  Pulse: 90  Temp:   Resp: 12    Complications: No apparent anesthesia complications

## 2014-04-16 NOTE — Discharge Instructions (Signed)
[facility] °[phone] ° °MASTECTOMY: POST OP INSTRUCTIONS ° °Always review your discharge instruction sheet given to you by the facility where your surgery was performed. °IF YOU HAVE DISABILITY OR FAMILY LEAVE FORMS, YOU MUST BRING THEM TO THE OFFICE FOR PROCESSING.   °DO NOT GIVE THEM TO YOUR DOCTOR. °A prescription for pain medication may be given to you upon discharge.  Take your pain medication as prescribed, if needed.  If narcotic pain medicine is not needed, then you may take acetaminophen (Tylenol), naprosyn (Alleve) or ibuprofen (Advil) as needed. °1. Take your usually prescribed medications unless otherwise directed. °2. If you need a refill on your pain medication, please contact your pharmacy.  They will contact our office to request authorization.  Prescriptions will not be filled after 5pm or on week-ends. °3. You should follow a light diet the first few days after arrival home, such as soup and crackers, etc.  Resume your normal diet the day after surgery. °4. Most patients will experience some swelling and bruising on the chest and underarm.  Ice packs will help.  Swelling and bruising can take several days to resolve. Wear the binder day and night until you return to the office.  °5. It is common to experience some constipation if taking pain medication after surgery.  Increasing fluid intake and taking a stool softener (such as Colace) will usually help or prevent this problem from occurring.  A mild laxative (Milk of Magnesia or Miralax) should be taken according to package instructions if there are no bowel movements after 48 hours. °6. Unless discharge instructions indicate otherwise, leave your bandage dry and in place until your next appointment in 3-5 days.  You may take a limited sponge bath.  No tube baths or showers until the drains are removed.  You may have steri-strips (small skin tapes) in place directly over the incision.  These strips should be left on the  skin for 7-10 days. If you have glue it will come off in next couple week.  Any sutures will be removed at an office visit °7. DRAINS:  If you have drains in place, it is important to keep a list of the amount of drainage produced each day in your drains.  Before leaving the hospital, you should be instructed on drain care.  Call our office if you have any questions about your drains. I will remove your drains when they put out less than 30 cc or ml for 2 consecutive days. °8. ACTIVITIES:  You may resume regular (light) daily activities beginning the next day--such as daily self-care, walking, climbing stairs--gradually increasing activities as tolerated.  You may have sexual intercourse when it is comfortable.  Refrain from any heavy lifting or straining until approved by your doctor. °a. You may drive when you are no longer taking prescription pain medication, you can comfortably wear a seatbelt, and you can safely maneuver your car and apply brakes. °b. RETURN TO WORK:  __________________________________________________________ °9. You should see your doctor in the office for a follow-up appointment approximately 3-5 days after your surgery.  Your doctor’s nurse will typically make your follow-up appointment when she calls you with your pathology report.  Expect your pathology report 3-4business days after surgery. °10. OTHER INSTRUCTIONS: ______________________________________________________________________________________________ ____________________________________________________________________________________________ °WHEN TO CALL YOUR DR Tobin Witucki: °1. Fever over 101.0 °2. Nausea and/or vomiting °3. Extreme swelling or bruising °4. Continued bleeding from incision. °5. Increased pain, redness, or drainage from the incision. °The clinic staff is available   to answer your questions during regular business hours.  Please don’t hesitate to call and ask to speak to one of the nurses for clinical concerns.  If  you have a medical emergency, go to the nearest emergency room or call 911.  A surgeon from [facility] is always on call at the hospital. °[address], [address], [place], [place]  [address] ? [address] [address], [place], [place]   [address] °[phone] ? [phone] ? FAX [phone] °Web site: [email or web address] ° °

## 2014-04-16 NOTE — Op Note (Signed)
Operative Note   DATE OF OPERATION: 9.10.15  LOCATION: [facility]-observation  SURGICAL DIVISION: Plastic Surgery  PREOPERATIVE DIAGNOSES:  1. Left breast cancer 2. BRCA mutation  POSTOPERATIVE DIAGNOSES:  same  PROCEDURE:  1. Bilateral breast reconstruction with tissue expanders 2. Bilateral breast reconstruction with acelluar dermis, 100 cm2  SURGEON: Irene Limbo MD MBA  ASSISTANT: none  ANESTHESIA:  General.   EBL: 300 ml, all procedures  COMPLICATIONS: None.   INDICATIONS FOR PROCEDURE:  The patient, Krystal [name], is a 48 y.o. female born on 05/03/1966, is here for treatment melanoma back and left breast cancer with immediate breast reconstruction.   FINDINGS: Mentor style 9200 450 ml expanders. Initial fill volume 225 ml. Ref [id number] Left SN [id number] Right SN [id number]  DESCRIPTION OF PROCEDURE:  Dr. Donne Hazel began the procedure with excision of back melanoma. Following completion of this, the patient was returned to supine position. The patient's operative site was prepped and draped in a sterile fashion. A time out was performed and all information was confirmed to be correct.  Following completion of mastectomies, reconstruction began on the right side. The cavity was irrigated with saline and hemostasis obtained. The inferior insertions of pectoralis major muscle were divided and submuscular dissection completed. Acelluar dermis was perforated and sewn to inferior border of pectoralis major with running 3-0 vicryl. A 15 Fr drain was placed in submuscular position and secured to skin with 2-0 nylon. The cavity was irrigated with solution containing Ancef, genatmicin, and bacitracin. The tissue expander was prepared and placed in submuscular position. The expander was secured to chest wall with a 3-0 vicryl. The inferior border of the acellular dermis was inset to inframammary fold and laterally was left free. The incision was closed with 3-0 vicryl in fascial  layer and 4-0 vicryl in dermis. Skin closure completed with 4-0 monocryl subcuticular and Dermabond. Similar procedure performed on opposite breast. The ports were accessed and filled to 225 ml bilaterally. Dry dressing and breast binder applied.   The patient was allowed to wake from anesthesia, extubated and taken to the recovery room in satisfactory condition.   SPECIMENS:none  DRAINS: 15 Fr JP in right and left breast cavity  Irene Limbo, MD [facility] Plastic [facility] Reconstructive Surgery [phone]

## 2014-04-16 NOTE — Brief Op Note (Signed)
04/16/2014  2:58 PM  PATIENT:  Krystal [name]  48 y.o. female  PRE-OPERATIVE DIAGNOSIS:  left breast cancer, back melanoma, brca 1 positive  POST-OPERATIVE DIAGNOSIS:  left breast cancer, back melanoma, brca 1 positive   PROCEDURE:  Procedure(s): WIDE LOCAL EXCISION OF BACK MELANOMA (N/A) BILATERAL SKIN SPARING  MASTECTOMIES WITH LEFT AXILLARY SENTINEL NODE BIOPSY (Bilateral) BILATERAL BREAST RECONSTRUCTION WITH PLACEMENT OF TISSUE EXPANDER AND FLEX HD (ACELLULAR HYDRATED DERMIS) (Bilateral)  SURGEON:  Surgeon(s) and Role: Panel 1:    * Rolm Bookbinder, MD - Primary  Panel 2:    * Irene Limbo, MD - Primary  PHYSICIAN ASSISTANT:   ASSISTANTS: none   ANESTHESIA:   general  EBL:  Total I/O In: 3000 [I.V.:3000] Out: 850 [Urine:550; Blood:300]  BLOOD ADMINISTERED:none  DRAINS: (15) Jackson-Pratt drain(s) with closed bulb suction in the left and right breast   LOCAL MEDICATIONS USED:  NONE  SPECIMEN:  No Specimen  DISPOSITION OF SPECIMEN:  N/A  COUNTS:  YES  TOURNIQUET:  * No tourniquets in log *  DICTATION: .Note written in EPIC  PLAN OF CARE: Admit for overnight observation  PATIENT DISPOSITION:  PACU - hemodynamically stable.   Delay start of Pharmacological VTE agent (>24hrs) due to surgical blood loss or risk of bleeding: no

## 2014-04-17 ENCOUNTER — Encounter (HOSPITAL_BASED_OUTPATIENT_CLINIC_OR_DEPARTMENT_OTHER): Payer: Self-pay | Admitting: General Surgery

## 2014-04-17 MED ORDER — SODIUM CHLORIDE 0.9 % IV SOLN
INTRAVENOUS | Status: DC
  Administered 2014-04-17: 14:00:00 via INTRAVENOUS

## 2014-04-17 NOTE — Evaluation (Signed)
Physical Therapy Evaluation Patient Details Name: Krystal [name] MRN: [id number] DOB: 12-23-1965 Today's Date: 04/17/2014   History of Present Illness  48 y.o female admitted to [facility] on 04/16/14 s/p bil mastectomies and L sided lymph node removal (restricted extremity).  Pt also had melanoma removal on her back.  Pt with no other significant PMHx.      Clinical Impression  Pt is mobilizing with min guard assist (except bed mobility mod assist) and moving with significantly slow gait speed.  She is very guarded, anxious, holds her breath and resistant to walking (due to pain).  She is guarded throughout bil upper extrimities and I am worried that if she does not get mastectomy specific outpatient PT  ([facility] PT [facility] at [facility] has an OP PT clinic specifically for CA recovery patients and post mastectomy education).   PT to follow acutely for deficits listed below.       Follow Up Recommendations Outpatient PT;Supervision for mobility/OOB (OP PT for postt mastectomy education [facility] st clinic))    Equipment Recommendations    NA   Recommendations for Other Services   NA    Precautions / Restrictions Precautions Precautions: Fall Precaution Comments: due to significantly impaired gait speed.  Restrictions Other Position/Activity Restrictions: Pt with bil JP drains      Mobility  Bed Mobility Overal bed mobility: Needs Assistance Bed Mobility: Supine to Sit     Supine to sit: Mod assist     General bed mobility comments: Mod assist to support trunk to get to sitting EOB with HOB elevated to 65 degrees.    Transfers Overall transfer level: Needs assistance Equipment used: None Transfers: Sit to/from Stand Sit to Stand: Min guard         General transfer comment: Min guard assist for safety due to slow speed and pain during transitions  Ambulation/Gait Ambulation/Gait assistance: Min guard Ambulation Distance (Feet): 60 Feet Assistive device:  (IV pole) Gait  Pattern/deviations: Step-through pattern;Shuffle Gait velocity: significantly decreased Gait velocity interpretation: <1.8 ft/sec, indicative of risk for recurrent falls General Gait Details: Pt with very slow and guarded gait speed.  She stops frequently due to sharp shooting pain in her chest.  Verbal cues to breathe.  Pt keeping right arm at 90 degrees at the elbow and stiff at her side.              Balance Overall balance assessment: Needs assistance Sitting-balance support: Feet supported;No upper extremity supported Sitting balance-Leahy Scale: Good     Standing balance support: No upper extremity supported Standing balance-Leahy Scale: Good                               Pertinent Vitals/Pain Pain Assessment: 0-10 Pain Score: 6  (goes up to a 10) Pain Location: right breast Pain Descriptors / Indicators: Burning Pain Intervention(s): Limited activity within patient's tolerance;Monitored during session;Repositioned    Home Living Family/patient expects to be discharged to:: Private residence Living Arrangements: Children (27, 42, 2 y.o. ) Available Help at Discharge: Family;Available 24 hours/day (cousin) Type of Home: Apartment Home Access: Stairs to enter Entrance Stairs-Rails: Right;Left (can't reach both) Entrance Stairs-Number of Steps: 2 Home Layout: One level        Prior Function Level of Independence: Independent         Comments: works, Immunologist job, full time     Journalist, newspaper   Dominant Hand: Right  Extremity/Trunk Assessment   Upper Extremity Assessment: Generalized weakness;RUE deficits/detail;LUE deficits/detail RUE Deficits / Details: limited use of bil R>R upper extrmities due to pain.  RUE: Unable to fully assess due to pain   LUE Deficits / Details: limited use of bil upper extremities due to pain.  Pt was able to push the IV pole with her left hand   Lower Extremity Assessment: Overall WFL for tasks  assessed      Cervical / Trunk Assessment: Normal  Communication   Communication: No difficulties  Cognition Arousal/Alertness: Awake/alert Behavior During Therapy: Anxious Overall Cognitive Status: Within Functional Limits for tasks assessed                      General Comments      Exercises Other Exercises Other Exercises: Educated pt re: the importance of ambulation and getting up out of the bed regularly.  I encouraged her to walk three times per day and sit up in the chair for at least an hour three times per day.        Assessment/Plan    PT Assessment Patient needs continued PT services  PT Diagnosis Difficulty walking;Abnormality of gait;Acute pain   PT Problem List Decreased strength;Decreased activity tolerance;Decreased balance;Decreased mobility;Decreased knowledge of precautions;Pain  PT Treatment Interventions DME instruction;Gait training;Stair training;Functional mobility training;Therapeutic activities;Therapeutic exercise;Balance training;Patient/family education   PT Goals (Current goals can be found in the Care Plan section) Acute Rehab PT Goals Patient Stated Goal: to stay in the hospital until her pain is undercontrol PT Goal Formulation: With patient Time For Goal Achievement: 05/01/14 Potential to Achieve Goals: Good    Frequency Min 3X/week    End of Session   Activity Tolerance: Patient limited by pain Patient left: in chair;with call bell/phone within reach;with family/visitor present Nurse Communication: Mobility status    Functional Assessment Tool Used: assist level Functional Limitation: Mobility: Walking and moving around Mobility: Walking and Moving Around Current Status (I6270): At least 20 percent but less than 40 percent impaired, limited or restricted Mobility: Walking and Moving Around Goal Status [id number]): At least 1 percent but less than 20 percent impaired, limited or restricted    Time: 1715-1757 PT Time Calculation  (min): 42 min   Charges:   PT Evaluation $Initial PT Evaluation Tier I: 1 Procedure PT Treatments $Gait Training: 8-22 mins $Therapeutic Activity: 8-22 mins   PT G Codes:   Functional Assessment Tool Used: assist level Functional Limitation: Mobility: Walking and moving around    Gilmore B. Adams, [place], DPT [phone]   04/17/2014, 7:33 PM

## 2014-04-17 NOTE — Progress Notes (Signed)
1 Day Post-Op  Subjective: Anxious, pain especially on right with deep breath, has been oob to void  Objective: Vital signs in last 24 hours: Temp:  [97.6 F (36.4 C)-99.6 F (37.6 C)] 98.3 F (36.8 C) (09/11 0545) Pulse Rate:  [65-116] 75 (09/11 0545) Resp:  [12-24] 20 (09/11 0545) BP: (78-142)/(56-105) 94/63 mmHg (09/11 0545) SpO2:  [76 %-100 %] 96 % (09/11 0600) Weight:  [233 lb (105.688 kg)] 233 lb (105.688 kg) (09/10 0822)    Intake/Output from previous day: 09/10 0701 - 09/11 0700 In: 4796 [P.O.:696; I.V.:4000; IV Piggyback:100] Out: 3835 [Urine:3050; Drains:485; Blood:300] Intake/Output this shift: Total I/O In: 544 [P.O.:444; IV Piggyback:100] Out: 2365 [Urine:2200; Drains:165]  General appearance: anxioux Resp: clear to auscultation bilaterally Breasts: no hematoma, drains serosang, flaps viable Cardio: regular rate and rhythm  Lab Results:   Recent Labs  04/14/14 1108  WBC 6.6  HGB 13.2  HCT 39.5  PLT 301   BMET  Recent Labs  04/14/14 1108  NA 140  K 4.9  CL 100  CO2 26  GLUCOSE 99  BUN 9  CREATININE 0.70  CALCIUM 9.0   PT/INR No results found for this basename: LABPROT, INR,  in the last 72 hours ABG No results found for this basename: PHART, PCO2, PO2, HCO3,  in the last 72 hours  Studies/Results: Nm Sentinel Node Inj-no Rpt (breast)  04/16/2014   CLINICAL DATA: left breast cancer   Sulfur colloid was injected intradermally by the nuclear medicine  technologist for breast cancer sentinel node localization.    Nm Sentinel Node Inj-no Rpt (melanoma)  04/16/2014   CLINICAL DATA: back melanoma   Sulfur colloid was injected by the nuclear medicine technologist for  melanoma sentinel node.     Anti-infectives: Anti-infectives   Start     Dose/Rate Route Frequency Ordered Stop   04/16/14 2200  ceFAZolin (ANCEF) IVPB 2 g/50 mL premix     2 g 100 mL/hr over 30 Minutes Intravenous 3 times per day 04/16/14 1934 04/17/14 2159   04/16/14 1400   bacitracin 50,000 Units, gentamicin (GARAMYCIN) 80 mg, ceFAZolin (ANCEF) 1 g in sodium chloride 0.9 % 1,000 mL  Status:  Discontinued       As needed 04/16/14 1414 04/16/14 1517   04/16/14 0844  ceFAZolin (ANCEF) IVPB 2 g/50 mL premix     2 g 100 mL/hr over 30 Minutes Intravenous On call to O.R. 04/16/14 0844 04/16/14 1020      Assessment/Plan: POD 1 wle back melanoma, left ax snbx, bilateral ssm with expanders  1. Pain control ok on oral with iv backup, cannot go home today, she also has significant anxiety perioperatively that will require continued stay 2. pulm toilet 3. Advance diet as tolerated 4. Monitor drains 5. Path pending 6. scds only 7. Will need to [facility] due to pain/anxiety issues  [facility] 04/17/2014

## 2014-04-17 NOTE — Progress Notes (Signed)
Patient arrived at 1345 via [facility]link. Stable in bed. Report given from Freddi Che.

## 2014-04-17 NOTE — Progress Notes (Signed)
POD# 1 bilateral mastectomies, bilateral reconstruction with TE, ADM  Overnight pain over right side, worse with deep breath C/o swelling RUE and moving of this arm States OOB to BR 3 times  Temp:  [97.6 F (36.4 C)-99.6 F (37.6 C)] 98.3 F (36.8 C) (09/11 0545) Pulse Rate:  [65-116] 75 (09/11 0545) Resp:  [12-24] 20 (09/11 0545) BP: (78-142)/(56-105) 94/63 mmHg (09/11 0545) SpO2:  [76 %-100 %] 97 % (09/11 0630) Weight:  [105.688 kg (233 lb)] 105.688 kg (233 lb) (09/10 0822)  JPs 225/260, serosanguinous  PE: Chest incisions dry, flat, skin flaps soft Back deferred  A/P: Transfer to [facility] for continued pain control and anxiety IS, ambulate today  Irene Limbo, MD [facility] Plastic & Reconstructive Surgery [phone]

## 2014-04-18 DIAGNOSIS — M7989 Other specified soft tissue disorders: Secondary | ICD-10-CM

## 2014-04-18 DIAGNOSIS — M79609 Pain in unspecified limb: Secondary | ICD-10-CM

## 2014-04-18 MED ORDER — KCL IN DEXTROSE-NACL 20-5-0.45 MEQ/L-%-% IV SOLN
INTRAVENOUS | Status: DC
  Filled 2014-04-18 (×2): qty 1000

## 2014-04-18 MED ORDER — CEFAZOLIN SODIUM 1-5 GM-% IV SOLN
1.0000 g | Freq: Three times a day (TID) | INTRAVENOUS | Status: DC
  Administered 2014-04-18 – 2014-04-20 (×7): 1 g via INTRAVENOUS
  Filled 2014-04-18 (×9): qty 50

## 2014-04-18 MED ORDER — SULFAMETHOXAZOLE-TMP DS 800-160 MG PO TABS: 1.0000 | ORAL_TABLET | Freq: Two times a day (BID) | ORAL | Status: DC

## 2014-04-18 MED ORDER — OXYCODONE HCL 5 MG PO TABS
10.0000 mg | ORAL_TABLET | ORAL | Status: DC | PRN
  Administered 2014-04-18: 10 mg via ORAL
  Administered 2014-04-18: 15 mg via ORAL
  Administered 2014-04-19: 10 mg via ORAL
  Filled 2014-04-18: qty 3
  Filled 2014-04-18 (×2): qty 2

## 2014-04-18 MED ORDER — OXYCODONE HCL 10 MG PO TABS: 10.0000 mg | ORAL_TABLET | ORAL | Status: DC | PRN

## 2014-04-18 MED ORDER — DIAZEPAM 5 MG PO TABS: 5.0000 mg | ORAL_TABLET | Freq: Three times a day (TID) | ORAL | Status: DC | PRN

## 2014-04-18 NOTE — Progress Notes (Signed)
UR completed 

## 2014-04-18 NOTE — Progress Notes (Signed)
VASCULAR LAB PRELIMINARY  PRELIMINARY  PRELIMINARY  PRELIMINARY  Left lower extremity venous Doppler completed.    Preliminary report:  There is no DVT or SVT noted in the left lower extremity.   Velencia Lenart, RVT 04/18/2014, 4:49 PM

## 2014-04-18 NOTE — Progress Notes (Signed)
Patient ID: Krystal [name], female   DOB: 19-Mar-1966, 48 y.o.   MRN: [id number]     [address] [place], [place] [address]    Phone: [phone] FAX: [phone]     Subjective: Still using dilaudid.  Ambulating more now.  Voiding.  Tolerating clears.  Afebrile.  VSS.    Objective:  Vital signs:  Filed Vitals:   04/17/14 1809 04/17/14 2147 04/18/14 0207 04/18/14 0558  BP: 102/59 106/56 104/53 96/62  Pulse: 87 96 115 106  Temp: 98.2 F (36.8 C) 98.8 F (37.1 C) 100.1 F (37.8 C) 99.7 F (37.6 C)  TempSrc: Oral Oral Oral Oral  Resp: 18 17 19 15   Height:      Weight:      SpO2: 99% 93% 91% 91%    Last BM Date: 04/15/14  Intake/Output   Yesterday:  09/11 0701 - 09/12 0700 In: 732 [P.O.:732] Out: 4742 [Urine:1575; Drains:200] This shift: I/O last 3 completed shifts: In: 1276 [P.O.:1176; IV Piggyback:100] Out: 5956 [Urine:3775; Drains:365]    Physical Exam: General: Pt awake/alert/oriented x4 in no acute distress Skin: incisions are c/d/i.  Drains with serosanguineous output.     Problem List:   Active Problems:   Breast cancer    Results:   Labs: No results found for this or any previous visit (from the past 48 hour(s)).  Imaging / Studies: Nm Sentinel Node Inj-no Rpt (breast)  04/16/2014   CLINICAL DATA: left breast cancer   Sulfur colloid was injected intradermally by the nuclear medicine  technologist for breast cancer sentinel node localization.     Medications / Allergies:  Scheduled Meds: .  ceFAZolin (ANCEF) IV  1 g Intravenous 3 times per day   Continuous Infusions: . dextrose 5 % and 0.45 % NaCl with KCl 20 mEq/L     PRN Meds:.acetaminophen, acetaminophen, diazepam, HYDROmorphone (DILAUDID) injection, ondansetron, oxyCODONE  Antibiotics: Anti-infectives   Start     Dose/Rate Route Frequency Ordered Stop   04/18/14 0930  ceFAZolin (ANCEF) IVPB 1 g/50 mL premix     1 g 100  mL/hr over 30 Minutes Intravenous 3 times per day 04/18/14 0910 04/21/14 0559   04/18/14 0000  sulfamethoxazole-trimethoprim (BACTRIM DS) 800-160 MG per tablet     1 tablet Oral 2 times daily 04/18/14 0919     04/16/14 2200  ceFAZolin (ANCEF) IVPB 2 g/50 mL premix     2 g 100 mL/hr over 30 Minutes Intravenous 3 times per day 04/16/14 1934 04/17/14 1425   04/16/14 1400  bacitracin 50,000 Units, gentamicin (GARAMYCIN) 80 mg, ceFAZolin (ANCEF) 1 g in sodium chloride 0.9 % 1,000 mL  Status:  Discontinued       As needed 04/16/14 1414 04/16/14 1517   04/16/14 0844  ceFAZolin (ANCEF) IVPB 2 g/50 mL premix     2 g 100 mL/hr over 30 Minutes Intravenous On call to O.R. 04/16/14 0844 04/16/14 1020        Assessment/Plan POD#2 wle back melanoma, left ax snbx, bilateral ssm with expanders -increase oxycodone 10-15mg  and transition off dilaudid -mobilize -pulmonary toilet -drain care, teaching -Advance diet -SCDs only -SLIV -home when pain is improved   Erby Pian, ANP-BC [employer] Surgery Pager [phone](7A-4:30P) For consults and floor pages call [phone](7A-4:30P)  04/18/2014 9:32 AM

## 2014-04-18 NOTE — Progress Notes (Signed)
POD# 2 bilateral mastectomies, bilateral reconstruction with TE, ADM  Seen by PT, ambulated in hall some, doing IS States the dilaudid working best but oxycodone not making her nauseous Still pain on right side, worse with movement, removing binder  Temp:  [98.1 F (36.7 C)-100.1 F (37.8 C)] 99.7 F (37.6 C) (09/12 0558) Pulse Rate:  [82-115] 106 (09/12 0558) Resp:  [15-22] 15 (09/12 0558) BP: (96-114)/(53-72) 96/62 mmHg (09/12 0558) SpO2:  [88 %-99 %] 91 % (09/12 0558) Weight:  [106.2 kg (234 lb 2.1 oz)] 106.2 kg (234 lb 2.1 oz) (09/11 1345)  JPs 90/110, serosanguinous  PE: Chest:flat, incisions and flaps developing some ecchymoses and likely some epidermolysis No drainage  A/P:  Ok to shower-pt declines Drain teaching Continue ambulation, IS, transition to po pain meds  Irene Limbo, MD [facility] Plastic & Reconstructive Surgery [phone]

## 2014-04-18 NOTE — Progress Notes (Signed)
Physical Therapy Treatment Patient Details Name: Krystal [name] MRN: [id number] DOB: 12-Jul-1966 Today's Date: 04/18/2014    History of Present Illness 48 y.o female admitted to [facility] on 04/16/14 s/p bil mastectomies and L sided lymph node removal (restricted extremity).  Pt also had melanoma removal on her back.  Pt with no other significant PMHx.        PT Comments    Pt progressing towards physical therapy goals, increasing ambulatory distance however continues to be limited by pain over Rt breast primarily. Educated on safe positioning techniques and therapeutic exercises today. Patient complains of left calf pain with dorsiflexion and is very tender to palpation over this area; RN notified. Patient will continue to benefit from skilled physical therapy services to further improve independence with functional mobility.   Follow Up Recommendations  Outpatient PT;Supervision for mobility/OOB (OP PT for postt mastectomy education [facility] st clinic))     Equipment Recommendations   (TBD based on progression)    Recommendations for Other Services       Precautions / Restrictions Precautions Precautions: Fall Precaution Comments: due to significantly impaired gait speed.  Restrictions Weight Bearing Restrictions: No Other Position/Activity Restrictions: Pt with bil JP drains    Mobility  Bed Mobility Overal bed mobility: Needs Assistance Bed Mobility: Sit to Supine       Sit to supine: Min guard;HOB elevated   General bed mobility comments: Min guard for safety with VC for technique. Requires extra time due to pain. Unable to practice entering bed with HOB flat due to pain at this time.  Transfers Overall transfer level: Needs assistance Equipment used: None Transfers: Sit to/from Stand Sit to Stand: Min guard         General transfer comment: Min guard assist for safety due to slow speed and pain during transitions  Ambulation/Gait Ambulation/Gait assistance: Min  guard Ambulation Distance (Feet): 95 Feet Assistive device:  (IV pole) Gait Pattern/deviations: Step-through pattern;Decreased stride length Gait velocity: significantly decreased   General Gait Details: Continues to have very slow and guarded gait. Unwilling to practice gait training with a cane as she prefers to hold the IV pole. Close guard for safety, pt with one instance of loss of balance posterior but able to self correct without PT assistance. Instructed on breathing techniques during ambulation   Stairs            Wheelchair Mobility    Modified Rankin (Stroke Patients Only)       Balance                                    Cognition Arousal/Alertness: Awake/alert Behavior During Therapy: WFL for tasks assessed/performed Overall Cognitive Status: Within Functional Limits for tasks assessed                      Exercises General Exercises - Lower Extremity Ankle Circles/Pumps: AROM;Both;10 reps;Supine Other Exercises Other Exercises: Educated on safe positioning techniques and importances of participating with PT, particularly activites of functional mobility that is most difficult for her ie bed mobility, so she can safely manage these tasks as comfortably as possible when she returns home.    General Comments General comments (skin integrity, edema, etc.): Pt complains of left calf pain with dorsiflexion and has tenderness with palpation. RN notified.       Pertinent Vitals/Pain Pain Assessment: 0-10 Pain Score: 5  Pain Location: right breast  Pain Descriptors / Indicators: Stabbing Pain Intervention(s): Limited activity within patient's tolerance;Monitored during session;Repositioned    Home Living                      Prior Function            PT Goals (current goals can now be found in the care plan section) Acute Rehab PT Goals PT Goal Formulation: With patient Time For Goal Achievement: 05/01/14 Potential to  Achieve Goals: Good Progress towards PT goals: Progressing toward goals    Frequency  Min 3X/week    PT Plan Current plan remains appropriate    Co-evaluation             End of Session   Activity Tolerance: Patient limited by pain Patient left: with call bell/phone within reach;with family/visitor present;in bed     Time: 1101-1130 PT Time Calculation (min): 29 min  Charges:  $Gait Training: 8-22 mins $Therapeutic Activity: 8-22 mins                    G Codes:      [employer], Lynd  Ellouise Newer 04/18/2014, 12:10 PM

## 2014-04-18 NOTE — Progress Notes (Signed)
Seen and agree  

## 2014-04-19 DIAGNOSIS — M79609 Pain in unspecified limb: Secondary | ICD-10-CM | POA: Diagnosis present

## 2014-04-19 DIAGNOSIS — Z170 Estrogen receptor positive status [ER+]: Secondary | ICD-10-CM | POA: Diagnosis not present

## 2014-04-19 DIAGNOSIS — C44599 Other specified malignant neoplasm of skin of other part of trunk: Secondary | ICD-10-CM | POA: Diagnosis present

## 2014-04-19 DIAGNOSIS — E669 Obesity, unspecified: Secondary | ICD-10-CM | POA: Diagnosis present

## 2014-04-19 DIAGNOSIS — Z6841 Body Mass Index (BMI) 40.0 and over, adult: Secondary | ICD-10-CM | POA: Diagnosis not present

## 2014-04-19 DIAGNOSIS — F17200 Nicotine dependence, unspecified, uncomplicated: Secondary | ICD-10-CM | POA: Diagnosis present

## 2014-04-19 DIAGNOSIS — Z1501 Genetic susceptibility to malignant neoplasm of breast: Secondary | ICD-10-CM | POA: Diagnosis not present

## 2014-04-19 DIAGNOSIS — C50019 Malignant neoplasm of nipple and areola, unspecified female breast: Secondary | ICD-10-CM | POA: Diagnosis present

## 2014-04-19 DIAGNOSIS — C44509 Unspecified malignant neoplasm of skin of other part of trunk: Secondary | ICD-10-CM | POA: Diagnosis present

## 2014-04-19 MED ORDER — DOCUSATE SODIUM 100 MG PO CAPS
100.0000 mg | ORAL_CAPSULE | Freq: Two times a day (BID) | ORAL | Status: DC
  Administered 2014-04-19 – 2014-04-21 (×5): 100 mg via ORAL
  Filled 2014-04-19 (×5): qty 1

## 2014-04-19 MED ORDER — OXYCODONE HCL 5 MG PO TABS
20.0000 mg | ORAL_TABLET | ORAL | Status: DC | PRN
  Filled 2014-04-19: qty 4

## 2014-04-19 MED ORDER — OXYCODONE HCL 5 MG PO TABS
10.0000 mg | ORAL_TABLET | ORAL | Status: DC | PRN
  Administered 2014-04-19: 10 mg via ORAL
  Administered 2014-04-19: 15 mg via ORAL
  Administered 2014-04-19: 10 mg via ORAL
  Administered 2014-04-19: 15 mg via ORAL
  Administered 2014-04-20: 10 mg via ORAL
  Administered 2014-04-20 (×2): 15 mg via ORAL
  Administered 2014-04-20 – 2014-04-21 (×2): 20 mg via ORAL
  Filled 2014-04-19 (×2): qty 3
  Filled 2014-04-19: qty 4
  Filled 2014-04-19 (×2): qty 2
  Filled 2014-04-19: qty 3
  Filled 2014-04-19: qty 2
  Filled 2014-04-19: qty 4

## 2014-04-19 MED ORDER — ACETAMINOPHEN 500 MG PO TABS
1000.0000 mg | ORAL_TABLET | Freq: Three times a day (TID) | ORAL | Status: DC
  Administered 2014-04-19 – 2014-04-21 (×7): 1000 mg via ORAL
  Filled 2014-04-19 (×9): qty 2

## 2014-04-19 MED ORDER — METHOCARBAMOL 500 MG PO TABS
500.0000 mg | ORAL_TABLET | Freq: Three times a day (TID) | ORAL | Status: DC | PRN
  Administered 2014-04-19 – 2014-04-20 (×3): 500 mg via ORAL
  Filled 2014-04-19 (×3): qty 1

## 2014-04-19 MED ORDER — KETOROLAC TROMETHAMINE 30 MG/ML IJ SOLN
30.0000 mg | Freq: Three times a day (TID) | INTRAMUSCULAR | Status: DC | PRN
  Administered 2014-04-19: 30 mg via INTRAVENOUS
  Filled 2014-04-19: qty 1

## 2014-04-19 MED ORDER — KETOROLAC TROMETHAMINE 15 MG/ML IJ SOLN: 15.0000 mg | Freq: Three times a day (TID) | INTRAMUSCULAR | Status: DC

## 2014-04-19 NOTE — Progress Notes (Signed)
Patient ID: Krystal [name], female   DOB: 15-Sep-1965, 48 y.o.   MRN: [id number]     [address] [place], [place] [address]    Phone: [phone] FAX: [phone]     Subjective: Low grade temp this AM.  Jama Flavors 100-223ft yesterday, around the whole floor today! Pain still remains an issue.  C/o left calf pain after she slept in the chair.  Dopplers are negative for DVT, no erythema or swelling, suspect muscular pain, elevate, may ice.   Objective:  Vital signs:  Filed Vitals:   04/19/14 0200 04/19/14 0245 04/19/14 0600 04/19/14 0748  BP: 83/51 90/56 96/50  104/54  Pulse: 88  88   Temp: 98.9 F (37.2 C)  100.6 F (38.1 C)   TempSrc: Oral  Oral   Resp: 17  17   Height:      Weight:      SpO2: 98%  91%     Last BM Date: 04/15/14  Intake/Output   Yesterday:  09/12 0701 - 09/13 0700 In: 1140 [P.O.:240] Out: 2138 [Urine:2000; Drains:138] This shift:    I/O last 3 completed shifts: In: 1140 [P.O.:240; Other:900] Out: 2503 [Urine:2275; Drains:228]    Physical Exam: General: Pt awake/alert/oriented x4 in no acute distress Chest: cta. No chest wall pain w good excursion Skin: incisions are c/d/i, no erythema.  CV:  Pulses intact.  Regular rhythm Abdomen: Soft.  Nondistended.  Non tender.  No evidence of peritonitis.  No incarcerated hernias.    Problem List:   Active Problems:   Breast cancer    Results:   Labs: No results found for this or any previous visit (from the past 48 hour(s)).  Imaging / Studies: No results found.  Medications / Allergies:  Scheduled Meds: .  ceFAZolin (ANCEF) IV  1 g Intravenous 3 times per day   Continuous Infusions:  PRN Meds:.acetaminophen, acetaminophen, diazepam, HYDROmorphone (DILAUDID) injection, ondansetron, oxyCODONE  Antibiotics: Anti-infectives   Start     Dose/Rate Route Frequency Ordered Stop   04/18/14 0930  ceFAZolin (ANCEF) IVPB 1 g/50 mL premix      1 g 100 mL/hr over 30 Minutes Intravenous 3 times per day 04/18/14 0910 04/21/14 0559   04/18/14 0000  sulfamethoxazole-trimethoprim (BACTRIM DS) 800-160 MG per tablet     1 tablet Oral 2 times daily 04/18/14 0919     04/16/14 2200  ceFAZolin (ANCEF) IVPB 2 g/50 mL premix     2 g 100 mL/hr over 30 Minutes Intravenous 3 times per day 04/16/14 1934 04/17/14 1425   04/16/14 1400  bacitracin 50,000 Units, gentamicin (GARAMYCIN) 80 mg, ceFAZolin (ANCEF) 1 g in sodium chloride 0.9 % 1,000 mL  Status:  Discontinued       As needed 04/16/14 1414 04/16/14 1517   04/16/14 0844  ceFAZolin (ANCEF) IVPB 2 g/50 mL premix     2 g 100 mL/hr over 30 Minutes Intravenous On call to O.R. 04/16/14 0844 04/16/14 1020       Assessment/Plan  POD#3 wle back melanoma, left ax snbx, bilateral ssm with expanders  -increase oxycodone 10-15mg  and transition off dilaudid  -mobilize  -pulmonary toilet  -drain care, teaching  -tolerating diet, add colace -SCDs only  -pain remains an issue.  Will increase oxycodone to 20mg , scheduled tylenol -low grade temp, reinforced IS use and mobilization.  monitor -home when pain is improved  Left calf pain -negative doppler, suspect muscular, may ice/heat/elevate  Thayne Cindric  Zoeya Gramajo, [facility] Surgery Pager [phone]) For consults and floor pages call [phone](7A-4:30P)  04/19/2014 9:01 AM

## 2014-04-19 NOTE — Progress Notes (Signed)
POD# 3 bilateral mastectomies, bilateral reconstruction with TE, ADM  Events notes, oxycodone increased, started scheduled tylenol  Temp:  [98.4 F (36.9 C)-100.6 F (38.1 C)] 100.6 F (38.1 C) (09/13 0600) Pulse Rate:  [88-98] 88 (09/13 0600) Resp:  [17-18] 17 (09/13 0600) BP: (83-104)/(48-56) 89/48 mmHg (09/13 1006) SpO2:  [90 %-98 %] 91 % (09/13 0600)  JPs 63/75 , serosanguinous  PE: Deferred-pt stated she has been examined by Gen Surgery team already  A/P:  Try increased oxycodone- though presently pt BP low and cannot receive any or any Dialudein Bowel protocol Ok to shower-pt willing to try today if able to receive pain meds prior Surgery to try Robaxin , d/c Valium Toradol added  Irene Limbo, MD [facility] Plastic & Reconstructive Surgery [phone]

## 2014-04-19 NOTE — Progress Notes (Signed)
Pt has pain issues.  Hopefully these get better.  Will continue to adjust meds to limit side effects.

## 2014-04-20 MED ORDER — METHOCARBAMOL 500 MG PO TABS
500.0000 mg | ORAL_TABLET | Freq: Three times a day (TID) | ORAL | Status: AC
  Administered 2014-04-20 – 2014-04-21 (×3): 500 mg via ORAL
  Filled 2014-04-20 (×4): qty 1

## 2014-04-20 MED ORDER — METHOCARBAMOL 500 MG PO TABS
500.0000 mg | ORAL_TABLET | Freq: Three times a day (TID) | ORAL | Status: DC | PRN
  Administered 2014-04-20 – 2014-04-21 (×3): 500 mg via ORAL
  Filled 2014-04-20 (×2): qty 1

## 2014-04-20 MED ORDER — KETOROLAC TROMETHAMINE 30 MG/ML IJ SOLN: 15.0000 mg | Freq: Three times a day (TID) | INTRAMUSCULAR | Status: DC | PRN

## 2014-04-20 MED ORDER — SULFAMETHOXAZOLE-TMP DS 800-160 MG PO TABS
1.0000 | ORAL_TABLET | Freq: Two times a day (BID) | ORAL | Status: DC
  Administered 2014-04-20 – 2014-04-21 (×3): 1 via ORAL
  Filled 2014-04-20 (×4): qty 1

## 2014-04-20 NOTE — Progress Notes (Signed)
4 Days Post-Op  Subjective: Pain fair control, ambulating up and around better  Objective: Vital signs in last 24 hours: Temp:  [97.5 F (36.4 C)-98.7 F (37.1 C)] 97.9 F (36.6 C) (09/14 0529) Pulse Rate:  [75-87] 76 (09/14 0529) Resp:  [15-18] 15 (09/14 0529) BP: (89-98)/(48-57) 98/57 mmHg (09/14 0529) SpO2:  [95 %-97 %] 95 % (09/14 0529) Last BM Date: 04/15/14  Intake/Output from previous day: 09/13 0701 - 09/14 0700 In: 200 [IV Piggyback:200] Out: 140 [Drains:140] Intake/Output this shift:    General appearance: no distress Resp: clear to auscultation bilaterally Cardio: regular rate and rhythm Incision/Wound:all incisions clean without infection   Lab Results:  No results found for this basename: WBC, HGB, HCT, PLT,  in the last 72 hours BMET No results found for this basename: NA, K, CL, CO2, GLUCOSE, BUN, CREATININE, CALCIUM,  in the last 72 hours PT/INR No results found for this basename: LABPROT, INR,  in the last 72 hours ABG No results found for this basename: PHART, PCO2, PO2, HCO3,  in the last 72 hours  Studies/Results: No results found.  Anti-infectives: Anti-infectives   Start     Dose/Rate Route Frequency Ordered Stop   04/18/14 0930  ceFAZolin (ANCEF) IVPB 1 g/50 mL premix     1 g 100 mL/hr over 30 Minutes Intravenous 3 times per day 04/18/14 0910 04/21/14 0559   04/18/14 0000  sulfamethoxazole-trimethoprim (BACTRIM DS) 800-160 MG per tablet     1 tablet Oral 2 times daily 04/18/14 0919     04/16/14 2200  ceFAZolin (ANCEF) IVPB 2 g/50 mL premix     2 g 100 mL/hr over 30 Minutes Intravenous 3 times per day 04/16/14 1934 04/17/14 1425   04/16/14 1400  bacitracin 50,000 Units, gentamicin (GARAMYCIN) 80 mg, ceFAZolin (ANCEF) 1 g in sodium chloride 0.9 % 1,000 mL  Status:  Discontinued       As needed 04/16/14 1414 04/16/14 1517   04/16/14 0844  ceFAZolin (ANCEF) IVPB 2 g/50 mL premix     2 g 100 mL/hr over 30 Minutes Intravenous On call to O.R.  04/16/14 0844 04/16/14 1020      Assessment/Plan: POD#4 wle back melanoma, left ax snbx, bilateral ssm with expanders  -attempt all po pain control today with robaxin, oxycodone and tylenol -mobilize  -pulmonary toilet  -drain care, teaching  -SCDs only  -home when pain is improved  -no ivs lue   [facility] 04/20/2014

## 2014-04-20 NOTE — Progress Notes (Signed)
POD# 4 bilateral mastectomies, bilateral reconstruction with TE, ADM  IV out, had left hand swelling Robaxin effective,+BM  Temp:  [97.5 F (36.4 C)-98.7 F (37.1 C)] 97.9 F (36.6 C) (09/14 0529) Pulse Rate:  [75-87] 76 (09/14 0529) Resp:  [15-18] 15 (09/14 0529) BP: (89-98)/(48-57) 98/57 mmHg (09/14 0529) SpO2:  [95 %-97 %] 95 % (09/14 0529)  JPs 60/80 , serosanguinous  PE: Chest soft, developing ecchymoses, incisions dry, counseled patient she may experience som blistering/drainage along incisions  A/P:   Shower today- did not try yesterday switch to PO Abx Counseled pt to have a look at chest today so that she knows what to expect at home-has not looked at it yet I removed Valium Rx from chart as she is using robaxin now.  Irene Limbo, MD [facility] [phone]

## 2014-04-20 NOTE — Progress Notes (Signed)
Physical Therapy Treatment Patient Details Name: Krystal [name] MRN: [id number] DOB: Dec 15, 1965 Today's Date: 04/20/2014    History of Present Illness 48 y.o female admitted to [facility] on 04/16/14 s/p bil mastectomies and L sided lymph node removal (restricted extremity).  Pt also had melanoma removal on her back.  Pt with no other significant PMHx.        PT Comments    Pt is progressing slowly, but well with her mobility, primarily limited by pain.  Education continued re: lymphedema precautions and possible f/u care (PT if needed and recommended by MD).  PT will continue to follow acutely for deficits below.   Follow Up Recommendations  Outpatient PT;Supervision for mobility/OOB;Other (comment) (OP PT when MD deems appropriate at [facility] )     Equipment Recommendations  None recommended by PT    Recommendations for Other Services   NA     Precautions / Restrictions Precautions Precautions: Fall Precaution Comments: due to significantly impaired gait speed.     Mobility  Bed Mobility Overal bed mobility: Needs Assistance Bed Mobility: Supine to Sit     Supine to sit: Supervision;HOB elevated     General bed mobility comments: supervision for safety.  Pt with very slow movements to EOB.  Pt plans on sleeping in the recliner chair at home, so therefore, we did not attempt bed mobility from bed flat position.   Transfers Overall transfer level: Needs assistance Equipment used: None Transfers: Sit to/from Stand Sit to Stand: Supervision         General transfer comment: supervision for safety due to slow speed of transitions.  Verbal cues to push up and control down with her legs.   Ambulation/Gait Ambulation/Gait assistance: Supervision;Min guard Ambulation Distance (Feet): 510 Feet Assistive device: None Gait Pattern/deviations: Step-through pattern (at times knees buckling due to pain) Gait velocity: significantly decreased Gait velocity interpretation:  <1.8 ft/sec, indicative of risk for recurrent falls General Gait Details: Slow and guarded gait speed.  Frequency of sharp pains seemed to decrease the second half of our walk.  Pt at times reaching for hallway railing and when sharp pains hit min guard assist for safety as pt tends to give at the knees.  Encouragement to breathe and brace during painful episodes.           Balance Overall balance assessment: Needs assistance Sitting-balance support: Feet supported;No upper extremity supported Sitting balance-Leahy Scale: Good     Standing balance support: No upper extremity supported Standing balance-Leahy Scale: Good                      Cognition Arousal/Alertness: Awake/alert Behavior During Therapy: Anxious Overall Cognitive Status: Within Functional Limits for tasks assessed                      Exercises General Exercises - Lower Extremity Ankle Circles/Pumps: AROM;Both;10 reps;Seated    General Comments General comments (skin integrity, edema, etc.): Educated pt on lymphedema precautions, and post-mastectomy resources (PT clinic on [address]).  I printed the contact information for the clinic that specializes in lymphedema and care for cancer patients.       Pertinent Vitals/Pain Pain Assessment: 0-10 Pain Score: 5  (generally at rest, but up to 10/10 with sharp pains) Pain Location: right breast Pain Descriptors / Indicators: Burning;Sharp Pain Intervention(s): Limited activity within patient's tolerance;Monitored during session;Repositioned (RN notified to know when next meds were due)  PT Goals (current goals can now be found in the care plan section) Acute Rehab PT Goals Patient Stated Goal: to stay in the hospital until her pain is undercontrol Progress towards PT goals: Progressing toward goals    Frequency  Min 3X/week    PT Plan Current plan remains appropriate            Time: 4008-6761 PT Time Calculation (min): 69  min  Charges:  $Gait Training: 38-52 mins $Therapeutic Activity: 8-22 mins $Self Care/Home Management: 8-22            Krystal [name], [place], DPT [phone]   04/20/2014, 5:53 PM

## 2014-04-20 NOTE — Progress Notes (Signed)
PT Cancellation Note  Patient Details Name: Krystal [name] MRN: [id number] DOB: 11-16-1965   Cancelled Treatment:    Reason Eval/Treat Not Completed: Other (comment) (pt bathing.  PT to attempt back later as time allows. )   Wells Guiles B. Mobridge, [place], DPT [phone]   04/20/2014, 3:27 PM

## 2014-04-21 MED ORDER — OXYCODONE HCL 10 MG PO TABS: 10.0000 mg | ORAL_TABLET | ORAL | Status: DC | PRN

## 2014-04-21 MED ORDER — SULFAMETHOXAZOLE-TMP DS 800-160 MG PO TABS: 1.0000 | ORAL_TABLET | Freq: Two times a day (BID) | ORAL | Status: DC

## 2014-04-21 MED ORDER — DSS 100 MG PO CAPS: 100.0000 mg | ORAL_CAPSULE | Freq: Two times a day (BID) | ORAL | Status: DC

## 2014-04-21 MED ORDER — METHOCARBAMOL 500 MG PO TABS: 500.0000 mg | ORAL_TABLET | Freq: Three times a day (TID) | ORAL | Status: DC | PRN

## 2014-04-21 NOTE — Progress Notes (Signed)
Patient discharged to home with instructions. 

## 2014-04-27 ENCOUNTER — Telehealth: Payer: Self-pay | Admitting: *Deleted

## 2014-04-27 ENCOUNTER — Telehealth (INDEPENDENT_AMBULATORY_CARE_PROVIDER_SITE_OTHER): Payer: Self-pay

## 2014-04-27 DIAGNOSIS — C50912 Malignant neoplasm of unspecified site of left female breast: Secondary | ICD-10-CM

## 2014-04-27 NOTE — Progress Notes (Signed)
Location of Breast Cancer: Left breast cancer  Histology per Pathology Report:   03/05/14 Diagnosis Breast, left, needle core biopsy, mass - INVASIVE DUCTAL CARCINOMA, SEE COMMENT.  04/16/14 Diagnosis 1. Skin , left upper back - NO RESIDUAL MALIGNANT MELANOMA IDENTIFIED, SEE COMMENT. - EXCISIONAL MARGINS, NEGATIVE FOR TUMOR. 2. Breast, simple mastectomy, Right - BENIGN BREAST TISSUE, SEE COMMENT. - NEGATIVE FOR ATYPIA OR MALIGNANCY. - SURGICAL MARGINS, NEGATIVE FOR ATYPIA OR MALIGNANCY. 3. Breast, simple mastectomy, Left - MULTIFOCAL INVASIVE DUCTAL CARCINOMA, SEE COMMENT. - POSITIVE FOR LYMPH VASCULAR INVASION. - INVASIVE TUMOR IS 1.2 CM FROM NEAREST MARGIN (ANTERIOR). 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 1- PREVIOUS BIOPSY SITE. - SEE TUMOR SYNOPTIC TEMPLATE BELOW. 4. Lymph node, sentinel, biopsy, Left axillary #1 - ONE LYMPH NODE, NEGATIVE FOR TUMOR (0/1). 5. Lymph node, sentinel, biopsy, Left axillary #2 - ONE LYMPH NODE, NEGATIVE FOR TUMOR (0/1). 6. Lymph node, sentinel, biopsy, Left axillary #3 - ONE LYMPH NODE, NEGATIVE FOR TUMOR (0/1). 7. Lymph node, sentinel, biopsy, Left axillary #4 - ONE LYMPH NODE, NEGATIVE FOR TUMOR (0/1).  Receptor Status: ER(100% +), PR (95% +), Her2-neu (negative)  Did patient present with symptoms (if so, please note symptoms) or was this found on screening mammography?: She was doing a breast exam and found a left breast mass.  Past/Anticipated interventions by surgeon, if any: 04/16/14 -Procedure: BILATERAL SKIN SPARING  MASTECTOMIES WITH LEFT AXILLARY SENTINEL NODE BIOPSY;  Surgeon: Rolm Bookbinder, MD;  Location: [place];  Service: General;  Laterality: Bilateral; Procedure: BILATERAL BREAST RECONSTRUCTION WITH PLACEMENT OF TISSUE EXPANDER AND FLEX HD (ACELLULAR HYDRATED DERMIS);  Surgeon: Irene Limbo, MD;  Location: [place];  Service: Plastics;  Laterality: Bilateral;    Past/Anticipated interventions by medical oncology, if any: no  Lymphedema issues, if any:  no  Pain issues, if any:  yes burning across middle of chest with movement.  Incisions have sharp pains. She feels tightness in her chest when waking up in the mornings.  Reports trouble getting a deep breath.  She is using an incentive spirometer.  She had her expanders filled for the first time today.    SAFETY ISSUES:  Prior radiation? no  Pacemaker/ICD? no  Possible current pregnancy?no  Is the patient on methotrexate? no  Current Complaints / other details:  Patient had melanoma in her upper midback that was removed on 04/16/14.  Patient has magnets her expanders.  She has 3 children.  She was 15 years at first period.  She was 32 years at birth of first child.  Used bcp for 2 years.  Still having periods.      Jacqulyn Liner, RN 04/27/2014,9:32 AM

## 2014-04-27 NOTE — Telephone Encounter (Signed)
Called pt and left a message for her to return my call so I can schedule a med onc appt.

## 2014-04-27 NOTE — Telephone Encounter (Signed)
Orders placed in epic for pt to have medical and radiation oncology referrals per Dr Donne Hazel.

## 2014-04-29 ENCOUNTER — Ambulatory Visit
Admission: RE | Admit: 2014-04-29 | Discharge: 2014-04-29 | Disposition: A | Discharge status: Home / Self Care | Payer: 59 | Source: Ambulatory Visit | Attending: Radiation Oncology | Admitting: Radiation Oncology

## 2014-04-29 ENCOUNTER — Other Ambulatory Visit: Payer: Self-pay | Admitting: Plastic Surgery

## 2014-04-29 ENCOUNTER — Encounter: Payer: Self-pay | Admitting: Radiation Oncology

## 2014-04-29 VITALS — BP 116/80 | HR 83 | Temp 98.2°F | Resp 16 | Ht 64.0 in | Wt 224.1 lb

## 2014-04-29 DIAGNOSIS — C4359 Malignant melanoma of other part of trunk: Secondary | ICD-10-CM | POA: Diagnosis not present

## 2014-04-29 DIAGNOSIS — C50919 Malignant neoplasm of unspecified site of unspecified female breast: Secondary | ICD-10-CM | POA: Diagnosis not present

## 2014-04-29 DIAGNOSIS — Z9010 Acquired absence of unspecified breast and nipple: Secondary | ICD-10-CM | POA: Insufficient documentation

## 2014-04-29 DIAGNOSIS — C50912 Malignant neoplasm of unspecified site of left female breast: Secondary | ICD-10-CM

## 2014-04-29 DIAGNOSIS — Z803 Family history of malignant neoplasm of breast: Secondary | ICD-10-CM | POA: Insufficient documentation

## 2014-04-29 DIAGNOSIS — Z9089 Acquired absence of other organs: Secondary | ICD-10-CM | POA: Insufficient documentation

## 2014-04-29 DIAGNOSIS — C50112 Malignant neoplasm of central portion of left female breast: Secondary | ICD-10-CM

## 2014-04-29 DIAGNOSIS — Z510 Encounter for antineoplastic radiation therapy: Secondary | ICD-10-CM | POA: Diagnosis present

## 2014-04-29 DIAGNOSIS — Z170 Estrogen receptor positive status [ER+]: Secondary | ICD-10-CM | POA: Diagnosis not present

## 2014-04-29 DIAGNOSIS — Z87891 Personal history of nicotine dependence: Secondary | ICD-10-CM | POA: Insufficient documentation

## 2014-04-29 NOTE — Progress Notes (Signed)
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 female who is seen out courtesy of Dr. Donne Hazel for an opinion concerning radiation therapy as part of management of patient's recently diagnosed left breast cancer. Earlier this year the patient was noted to have a mole in her upper back. She underwent shave biopsy by Dr. Allyson Sabal and was found to have a superficial spreading melanoma measuring 1.7 mm thick. With this new diagnosis of melanoma patient proceeded with a self breast examination and was noted to have a mass within the left breast. She proceeded to undergo imaging and an irregular mass was noted in the left breast measuring approximately 2.4 cm. The patient proceeded to undergo biopsy which revealed invasive ductal carcinoma which was ER and PR positive HER-2/neu negative, Ki-67 15%.   the patient was seen by Dr. Donne Hazel for evaluation of both her melanoma and breast cancer. In light of family history and the patient's age she was referred for genetic testing. Patient was found to have genetic mutation with BRCA1  gene. the patient elected to proceed with bilateral nipple sparing mastectomies and bilateral immediate breast reconstruction with tissue expanders. This surgery was performed on September 10. Upon pathologic review the patient was found to have no evidence of malignancy in the right breast the left upper back showed no residual malignant melanoma.  the left breast revealed multifocal invasive ductal carcinoma with the largest lesion measuring 1.2 cm. The second lesion measured 0.6 cm. The surgical margins were clear with the closest  margin being 1.2 cm. Patient had 4 benign lymph nodes removed from the left axillary area as part of her sentinel node procedure. The patient is now seen for consideration for postmastectomy irradiation.Marland Kitchen  PREVIOUS RADIATION THERAPY: No  PAST MEDICAL HISTORY:  has a past medical history of Breast cancer; Melanoma; Family history of malignant neoplasm of breast; BRCA1 positive; Gestational diabetes; Wears glasses; Complication of anesthesia; and PONV (postoperative nausea and vomiting).    PAST SURGICAL HISTORY: Past Surgical History  Procedure Laterality Date  . Cholecystectomy  1995  . Cesarean section  1999  . Cesarean section  2001  . Cesarean section  2005  . Colonoscopy  last 90's  . Melanoma excision N/A 04/16/2014    Procedure: WIDE LOCAL EXCISION OF BACK MELANOMA;  Surgeon: Rolm Bookbinder, MD;  Location: [place];  Service: General;  Laterality: N/A;  . Simple mastectomy with axillary sentinel node biopsy Bilateral 04/16/2014    Procedure: BILATERAL SKIN SPARING  MASTECTOMIES WITH LEFT AXILLARY SENTINEL NODE BIOPSY;  Surgeon: Rolm Bookbinder, MD;  Location: [place];  Service: General;  Laterality: Bilateral;  . Breast reconstruction with placement of tissue expander and flex hd (acellular hydrated dermis) Bilateral 04/16/2014    Procedure: BILATERAL BREAST RECONSTRUCTION WITH PLACEMENT OF TISSUE EXPANDER AND FLEX HD (ACELLULAR HYDRATED DERMIS);  Surgeon: Irene Limbo, MD;  Location: [place];  Service: Plastics;  Laterality: Bilateral;    FAMILY HISTORY: family history includes Breast cancer in her paternal grandmother; Breast cancer (age of onset: 60) in her paternal aunt; Pancreatic cancer (age of onset: 70) in her paternal uncle.  SOCIAL HISTORY:  reports that she quit smoking 12 days ago. Her smoking use included Cigarettes. She has a 1 pack-year smoking history. She has never used smokeless tobacco. She reports that she  does not drink alcohol or use illicit drugs.  ALLERGIES: Codeine  MEDICATIONS:  Current Outpatient Prescriptions  Medication Sig Dispense Refill  . docusate sodium 100 MG CAPS Take 100 mg by mouth 2 (two) times daily.  40 capsule  0  . methocarbamol (ROBAXIN) 500 MG tablet Take 1 tablet (500 mg total) by mouth every 8 (eight) hours as needed for muscle spasms (after the scheduled doses for next 24 hours are complete).  30 tablet  0  . ondansetron (ZOFRAN) 4 MG tablet 4 mg every 6 (six) hours as needed.      . sulfamethoxazole-trimethoprim (BACTRIM DS) 800-160 MG per tablet Take 1 tablet by mouth 2 (two) times daily.  12 tablet  0  . oxyCODONE 10 MG TABS Take 1-1.5 tablets (10-15 mg total) by mouth every 4 (four) hours as needed for moderate pain.  50 tablet  0  . oxyCODONE 10 MG TABS Take 1-2 tablets (10-20 mg total) by mouth every 4 (four) hours as needed for moderate pain.  40 tablet  0   No current facility-administered medications for this encounter.    REVIEW OF SYSTEMS:  A 15 point review of systems is documented in the electronic medical record. This was obtained by the nursing staff. However, I reviewed this with the patient to discuss relevant findings and make appropriate changes.  Prior to diagnosis the patient denies any pain within the breast area nipple discharge or bleeding. She denies any new bony pain headaches dizziness or blurred vision. Patient does have a lot of soreness in both chest areas in light of her recent surgery   PHYSICAL EXAM:  height is _0  (1.626 m) and weight is 224 lb 1.6 oz (101.651 kg). Her oral temperature is 98.2 F (36.8 C). Her blood pressure is 116/80 and her pulse is 83. Her respiration is 16.  this is a very pleasant 48 year old female in no acute distress. Examination of the pupils reveals them to be equal and reactive to light. Extraocular eye movements are intact. Tongue is midline. There is no secondary infection noted more cavity or posterior  pharynx. Examination of the neck and supraclavicular region reveals no evidence of adenopathy. The axillary areas are free of adenopathy. Examination of the lungs reveals them to be clear. The heart has a regular rhythm and rate. The abdomen is soft and nontender with normal bowel sounds. Examination of the left upper back reveals a bandage in place with which was not removed. The chest area shows bilateral reconstruction with horizontal scars present in both chest areas. Patient has JP change in place bilaterally    ECOG = 1   LABORATORY DATA:  Lab Results  Component Value Date   WBC 6.6 04/14/2014   HGB 13.2 04/14/2014   HCT 39.5 04/14/2014   MCV 87.4 04/14/2014   PLT 301 04/14/2014   NEUTROABS 4.8 04/14/2014   Lab Results  Component Value Date   NA 140 04/14/2014   K 4.9 04/14/2014   CL 100 04/14/2014   CO2 26 04/14/2014   GLUCOSE 99 04/14/2014   CREATININE 0.70 04/14/2014   CALCIUM 9.0 04/14/2014      RADIOGRAPHY: Nm Sentinel Node Inj-no Rpt (breast)  04/16/2014   CLINICAL DATA: left breast cancer   Sulfur colloid was injected intradermally by the nuclear medicine  technologist for breast cancer sentinel node localization.    Nm Sentinel Node Inj-no Rpt (melanoma)  04/16/2014   CLINICAL DATA: back melanoma   Sulfur colloid was injected by the nuclear medicine technologist for  melanoma sentinel node.       IMPRESSION: Multifocal invasive ductal carcinoma of the left breast. Patient had clear margins on her mastectomy specimen with no evidence of lymph node metastasis. The patient's lesions were not extensive in size. I reviewed the general indications for postmastectomy irradiation with the patient and her family. At This time I do not see any immediate indications for postmastectomy irradiation for this patient. Patient appears to understand and is happy that she will not require radiation therapy as part of her management.  PLAN: Medical oncology consultation in the near future.       ------------------------------------------------  Blair Promise, PhD, MD

## 2014-04-29 NOTE — Progress Notes (Signed)
Please see the Nurse Progress Note in the MD Initial Consult Encounter for this patient. 

## 2014-04-30 ENCOUNTER — Telehealth: Payer: Self-pay | Admitting: *Deleted

## 2014-04-30 NOTE — Telephone Encounter (Signed)
Left message for pt to return my call so I can schedule a med onc appt. 

## 2014-05-01 ENCOUNTER — Telehealth: Payer: Self-pay | Admitting: *Deleted

## 2014-05-01 ENCOUNTER — Encounter: Payer: Self-pay | Admitting: *Deleted

## 2014-05-01 NOTE — Telephone Encounter (Signed)
Pt returned my call and I confirmed 05/12/14 appt w/ her.  Mailed before appt letter, welcoming packet & intake form to pt.  Emailed Lars Mage and [place] at [facility] to make them aware.  Added to spreadsheet.

## 2014-05-01 NOTE — Progress Notes (Signed)
[facility] Psychosocial Distress Screening Clinical Social Work  Clinical Social Work was referred by distress screening protocol.  The patient scored a 8 on the Psychosocial Distress Thermometer which indicates severe distress. Clinical Social Worker phoned pt to assess for distress and other psychosocial needs. CSW talked with pt at length and she shared several strengths and several concerns. Pt is a single mother to three children: 46yo boy, 96yo boy and 10yo daughter. She is recovering from her surgery and reports to have great support from her aunt and a wide circle of friends. She feels they can continue to get her to appointments as needed, so transportation is currently addressed. CSW also reviewed resources to help with transportation as well.   Pt is currently still recovering from a traumatic grief and loss event. Her husband committed suicide in April and this has been her biggest cause of distress. She experiences much guilt related to this. CSW spoke with pt at length on this issue, providing support, grief related education and coping techniques. Her children have a different father from a previous marriage. Currently, she feels they are coping well. CSW reviewed Pt and Family Support Team resources/programs and other coping assistance. Pt plans to attend support group and is very interested in an [employer], she agrees to [employer] making referral on her behalf.   ONCBCN DISTRESS SCREENING 04/29/2014  Screening Type Initial Screening  Elta [place] the number that describes how much distress you have been experiencing in the past week 8  Practical problem type Transportation  Emotional problem type Nervousness/Anxiety;Adjusting to illness;Isolation/feeling alone;Feeling hopeless;Adjusting to appearance changes  Spiritual/Religous concerns type Facing my mortality  Information Concerns Type Lack of info about treatment  Physical Problem type Pain;Getting  around;Bathing/dressing;Breathing;Constipation/diarrhea;Skin dry/itchy  Physician notified of physical symptoms Yes    Clinical Social Worker follow up needed: Yes.    If yes, follow up plan: Pt aware how to contact CSW as needed. She plans to make her medical oncology appointment and CSW will attempt to follow up then.  [facility] referral Loren Racer, [place] Worker [place]  [place] Phone: [phone] Fax: [phone]

## 2014-05-04 ENCOUNTER — Ambulatory Visit: Payer: 59 | Attending: Plastic Surgery | Admitting: Physical Therapy

## 2014-05-12 ENCOUNTER — Ambulatory Visit (HOSPITAL_BASED_OUTPATIENT_CLINIC_OR_DEPARTMENT_OTHER): Payer: 59 | Admitting: Hematology and Oncology

## 2014-05-12 ENCOUNTER — Ambulatory Visit (HOSPITAL_BASED_OUTPATIENT_CLINIC_OR_DEPARTMENT_OTHER): Payer: 59

## 2014-05-12 ENCOUNTER — Encounter: Payer: Self-pay | Admitting: *Deleted

## 2014-05-12 ENCOUNTER — Telehealth: Payer: Self-pay | Admitting: Hematology and Oncology

## 2014-05-12 ENCOUNTER — Encounter: Payer: Self-pay | Admitting: Hematology and Oncology

## 2014-05-12 VITALS — BP 122/79 | HR 78 | Temp 98.3°F | Resp 18 | Ht 64.0 in | Wt 228.4 lb

## 2014-05-12 DIAGNOSIS — Z8041 Family history of malignant neoplasm of ovary: Secondary | ICD-10-CM

## 2014-05-12 DIAGNOSIS — Z803 Family history of malignant neoplasm of breast: Secondary | ICD-10-CM

## 2014-05-12 DIAGNOSIS — Z170 Estrogen receptor positive status [ER+]: Secondary | ICD-10-CM

## 2014-05-12 DIAGNOSIS — Z85820 Personal history of malignant melanoma of skin: Secondary | ICD-10-CM

## 2014-05-12 DIAGNOSIS — C50112 Malignant neoplasm of central portion of left female breast: Secondary | ICD-10-CM

## 2014-05-12 DIAGNOSIS — Z23 Encounter for immunization: Secondary | ICD-10-CM

## 2014-05-12 DIAGNOSIS — C50812 Malignant neoplasm of overlapping sites of left female breast: Secondary | ICD-10-CM

## 2014-05-12 MED ORDER — INFLUENZA VAC SPLIT QUAD 0.5 ML IM SUSY
0.5000 mL | PREFILLED_SYRINGE | Freq: Once | INTRAMUSCULAR | Status: AC
  Administered 2014-05-12: 0.5 mL via INTRAMUSCULAR
  Filled 2014-05-12: qty 0.5

## 2014-05-12 NOTE — Telephone Encounter (Signed)
per pof to sch pt appt-sch & gave pt copy of sch °

## 2014-05-12 NOTE — Progress Notes (Signed)
Note created by Dr. Gudena during office visit. Copy to patient, original to scan. 

## 2014-05-12 NOTE — Progress Notes (Signed)
Checked in new patient with no financial issues prior to seeing the dr. She has appt card and has not been out of the country. I gave her Lenise's card if any asst is needed.

## 2014-05-12 NOTE — Progress Notes (Signed)
[facility] CONSULT NOTE  Patient Care Team: Darlyn Chamber, MD as PCP - General (Obstetrics and Gynecology)  CHIEF COMPLAINTS/PURPOSE OF CONSULTATION:  Newly diagnosed breast cancer  HISTORY OF PRESENTING ILLNESS:  Krystal [name] 48 y.o. 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 The cancer was originally diagnosed when she had a melanoma on the back that was resected and she felt a lump in the breast that led to further imaging studies mammograms and ultrasounds and biopsies have proven to be breast cancer. She had BRCA1 mutation on genetic testing and hence she elected to undergo bilateral mastectomies on 04/16/2014. She is recovering very well from the surgery and still continues to have some drainage issues for which she is using pads. She is a single mother with 3 children and is here today with her aunt. She has extensive family history of breast cancer and ovarian cancer.   I reviewed her records extensively and collaborated the history with the patient.  SUMMARY OF ONCOLOGIC HISTORY:   Cancer of central portion of female breast   04/16/2014 Surgery Bilateral mastectomies: Left breast : Multifocal invasive ductal carcinoma positive for lymphovascular invasion, 1.2 cm and 0.6 cm, grade 3, high-grade DCIS with comedonecrosis, 4 SLN negative T1 C. N0 M0 stage IA   04/16/2014 Surgery Left upper back melanoma resected no residual melanoma identified on re-resection margins negative   MEDICAL HISTORY:  Past Medical History  Diagnosis Date  . Breast cancer   . Melanoma   . Family history of malignant neoplasm of breast   . BRCA1 positive     BRCA1 [id number]  . Gestational diabetes   . Wears glasses   . Complication of anesthesia   . PONV  (postoperative nausea and vomiting)     SURGICAL HISTORY: Past Surgical History  Procedure Laterality Date  . Cholecystectomy  1995  . Cesarean section  1999  . Cesarean section  2001  . Cesarean section  2005  . Colonoscopy  last 90's  . Melanoma excision N/A 04/16/2014    Procedure: WIDE LOCAL EXCISION OF BACK MELANOMA;  Surgeon: Rolm Bookbinder, MD;  Location: [facility];  Service: General;  Laterality: N/A;  . Simple mastectomy with axillary sentinel node biopsy Bilateral 04/16/2014    Procedure: BILATERAL SKIN SPARING  MASTECTOMIES WITH LEFT AXILLARY SENTINEL NODE BIOPSY;  Surgeon: Rolm Bookbinder, MD;  Location: [facility];  Service: General;  Laterality: Bilateral;  . Breast reconstruction with placement of tissue expander and flex hd (acellular hydrated dermis) Bilateral 04/16/2014    Procedure: BILATERAL BREAST RECONSTRUCTION WITH PLACEMENT OF TISSUE EXPANDER AND FLEX HD (ACELLULAR HYDRATED DERMIS);  Surgeon: Irene Limbo, MD;  Location: [facility];  Service: Plastics;  Laterality: Bilateral;    SOCIAL HISTORY: History   Social History  . Marital Status: Widowed    Spouse Name: N/A    Number of Children: 3  . Years of Education: N/A   Occupational History  . Oncologist   Social History Main Topics  . Smoking status: Former Smoker -- 0.50 packs/day for 2 years    Types: Cigarettes    Quit date: 04/17/2014  . Smokeless tobacco: Never Used  . Alcohol Use: No  . Drug Use: No  . Sexual Activity:  Not on file     Comment: trying not to smoke for surgery   Other Topics Concern  . Not on file   Social History Narrative  . No narrative on file    FAMILY HISTORY: Family History  Problem Relation Age of Onset  . Breast cancer Paternal Aunt 61    deceased 80s  . Breast cancer Paternal Grandmother     dx 42s; ? if had 2nd BC in 45s; deceased 65s  . Pancreatic cancer Paternal  Uncle 31    smoker; deceased    ALLERGIES:  is allergic to codeine.  MEDICATIONS:  Current Outpatient Prescriptions  Medication Sig Dispense Refill  . docusate sodium 100 MG CAPS Take 100 mg by mouth 2 (two) times daily.  40 capsule  0  . methocarbamol (ROBAXIN) 500 MG tablet Take 1 tablet (500 mg total) by mouth every 8 (eight) hours as needed for muscle spasms (after the scheduled doses for next 24 hours are complete).  30 tablet  0  . ondansetron (ZOFRAN) 4 MG tablet 4 mg every 6 (six) hours as needed.      Marland Kitchen oxyCODONE 10 MG TABS Take 1-2 tablets (10-20 mg total) by mouth every 4 (four) hours as needed for moderate pain.  40 tablet  0   No current facility-administered medications for this visit.    REVIEW OF SYSTEMS:   Constitutional: Denies fevers, chills or abnormal night sweats Eyes: Denies blurriness of vision, double vision or watery eyes Ears, nose, mouth, throat, and face: Denies mucositis or sore throat Respiratory: Denies cough, dyspnea or wheezes Cardiovascular: Denies palpitation, chest discomfort or lower extremity swelling Gastrointestinal:  Denies nausea, heartburn or change in bowel habits Skin: Denies abnormal skin rashes Lymphatics: Denies new lymphadenopathy or easy bruising Neurological:Denies numbness, tingling or new weaknesses Behavioral/Psych: Mood is stable, no new changes  Breast: Drainage from the breast All other systems were reviewed with the patient and are negative.  PHYSICAL EXAMINATION: ECOG PERFORMANCE STATUS: 1 - Symptomatic but completely ambulatory  Filed Vitals:   05/12/14 1300  BP: 122/79  Pulse: 78  Temp: 98.3 F (36.8 C)  Resp: 18   Filed Weights   05/12/14 1300  Weight: 228 lb 6.4 oz (103.602 kg)    GENERAL:alert, no distress and comfortable SKIN: skin color, texture, turgor are normal, no rashes or significant lesions EYES: normal, conjunctiva are pink and non-injected, sclera clear OROPHARYNX:no exudate, no erythema and  lips, buccal mucosa, and tongue normal  NECK: supple, thyroid normal size, non-tender, without nodularity LYMPH:  no palpable lymphadenopathy in the cervical, axillary or inguinal LUNGS: clear to auscultation and percussion with normal breathing effort HEART: regular rate & rhythm and no murmurs and no lower extremity edema ABDOMEN:abdomen soft, non-tender and normal bowel sounds Musculoskeletal:no cyanosis of digits and no clubbing  PSYCH: alert & oriented x 3 with fluent speech NEURO: no focal motor/sensory deficits  LABORATORY DATA:  I have reviewed the data as listed Lab Results  Component Value Date   WBC 6.6 04/14/2014   HGB 13.2 04/14/2014   HCT 39.5 04/14/2014   MCV 87.4 04/14/2014   PLT 301 04/14/2014   Lab Results  Component Value Date   NA 140 04/14/2014   K 4.9 04/14/2014   CL 100 04/14/2014   CO2 26 04/14/2014    RADIOGRAPHIC STUDIES: I have personally reviewed the radiological reports and agreed with the findings in the report.  ASSESSMENT AND PLAN:  Cancer of central portion of female breast Left  breast multifocal invasive ductal carcinoma status post bilateral mastectomies because she has BRCA1 mutation, T1 C. N0 M0 stage IA ER 100%, PR 95%, HER-2 negative ratio 1.9, Ki-67 15% with additional satellite nodule 0.6 cm I discussed with the patient and the results of pathology including the staging as well as the estrogen progesterone and HER-2 receptors and their significance in terms of treatment. I discussed the high risk features including grade 3, lymphovascular invasion. I recommended sending her tissue for Oncotype DX testing to determine whether or not she requires systemic adjuvant chemotherapy.  2. BRCA1 gene mutation: I discussed with her that she would need her ovaries taken out. Patient will discuss with GYN to have structurally and bilateral salpingo-oophorectomy at the same time.  Patient class undergo breast reconstruction if she does not need chemotherapy.  3.  Aromatase inhibitor counseling: If patient undergoes hysterectomy and salpingo-oophorectomy, she could be a candidate for aromatase inhibitor therapy.We discussed the risks and benefits of anti-estrogen therapy with aromatase inhibitors. These include but not limited to insomnia, hot flashes, mood changes, vaginal dryness, bone density loss, and weight gain. Although rare, serious side effects including endometrial cancer, risk of blood clots were also discussed. We strongly believe that the benefits far outweigh the risks. Patient understands these risks and consented to starting treatment. Planned treatment duration is 5 years.  Return to clinic in 2 weeks to discuss the results of Oncotype DX testing and to determine further treatment.   All questions were answered. The patient knows to call the clinic with any problems, questions or concerns. I spent 55 minutes counseling the patient face to face. The total time spent in the appointment was 60 minutes and more than 50% was on counseling.     Rulon Eisenmenger, MD 05/12/2014 2:01 PM

## 2014-05-12 NOTE — Assessment & Plan Note (Addendum)
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 N0 M0 stage IA ER 100%, PR 95%, HER-2 negative ratio 1.9, Ki-67 15% with additional satellite nodule 0.6 cm I discussed with the patient and the results of pathology including the staging as well as the estrogen progesterone and HER-2 receptors and their significance in terms of treatment. I discussed the high risk features including grade 3, lymphovascular invasion. I recommended sending her tissue for Oncotype DX testing to determine whether or not she requires systemic adjuvant chemotherapy.  2. BRCA1 gene mutation: I discussed with her that she would need her ovaries taken out. Patient will discuss with GYN to have structurally and bilateral salpingo-oophorectomy at the same time.  Patient class undergo breast reconstruction if she does not need chemotherapy.  3. Aromatase inhibitor counseling: If patient undergoes hysterectomy and salpingo-oophorectomy, she could be a candidate for aromatase inhibitor therapy.We discussed the risks and benefits of anti-estrogen therapy with aromatase inhibitors. These include but not limited to insomnia, hot flashes, mood changes, vaginal dryness, bone density loss, and weight gain. Although rare, serious side effects including endometrial cancer, risk of blood clots were also discussed. We strongly believe that the benefits far outweigh the risks. Patient understands these risks and consented to starting treatment. Planned treatment duration is 5 years.  Return to clinic in 2 weeks to discuss the results of Oncotype DX testing and to determine further treatment.

## 2014-05-12 NOTE — Progress Notes (Signed)
Received order for oncotype dx testing. Requisition sent to pathology. Received by Christy. 

## 2014-05-14 ENCOUNTER — Telehealth: Payer: Self-pay | Admitting: Hematology and Oncology

## 2014-05-14 ENCOUNTER — Encounter (HOSPITAL_BASED_OUTPATIENT_CLINIC_OR_DEPARTMENT_OTHER): Payer: Self-pay | Admitting: *Deleted

## 2014-05-14 NOTE — H&P (Signed)
  Subjective:   Patient ID: Krystal [name] is a 48 y.o. female.  HPI  4 weeks post operative. Seen by Dr Lindi Adie and plan to start AI, oncotype pending. Also referred to GYN for hysterectomy/BSO consultation. Reports 3-4 day history drainage from breast, no fevers.  Final pathology with no residual melanoma. Right breast benign. Left breast with multifocal IDC 1.5 cm and 0.6 cm, all SLN negative.  Initially presented for changing mole back. She underwent shave biopsy showing superficial spreading melanoma 1.7 mm, no ulceration. Following diagnosis of melanoma, she noted a left breast mass. MMG showed an irregular mass and calcs in left breast total dimension was 2.4 cm. US showed a 8 mm mass in left breast. Biopsy showed IDC Er/PR+, Her 2 -. MRI with known malignancy in the anterior left breast 1.6 cm and additional suspicious linear clumped enhancement extends posteriorly from the mass, with total extent measuring up to 5 cm. An additional site of linear clumped enhancement in the lower far inner left breast spans a distance of 3.1 cm.  Genetic testing with BRCA1+Prior 40/42 C/D. Desires similar. Wt. down 35 lb over last year by smoking per pt.  R mastectomy wt 972 g, L mastectomy 947 g  1 ppd smoker,quit just prior to surgery  Review of Systems   Objective:   Physical Exam  Cardiovascular: Regular rhythm and normal heart sounds.  Pulmonary/Chest: Effort normal and breath sounds normal.  Genitourinary:  Bilateral chest soft, no cellulitis, dehiscence of mastectomy incisions with exposure SQ fat, greater on right appr 6 cm apparent marginal skin necrosis Left with 2-3 cm superficial dehiscene   Bilateral breasts with mild epidermolysis at incision, but no signs of infection.  Assessment:    L breast cancer  BRCA+  Melanoma back   Plan:    Marginal necrosis bilateral mastectomy incisions. Plan debridement and closure, overall quite soft and may tolerate further expansion intra op.  Plan OR tomorrow. Plan OP procedure.   Mentor style 9200 450 ml expanders.  Initial fill volume Right 335 ml  Initial fill volume Left 335 ml   Irene Limbo, MD [facility] Plastic & Reconstructive Surgery [phone]

## 2014-05-14 NOTE — Telephone Encounter (Signed)
Mailed pt medical records to the pt.  Signed ROI  form

## 2014-05-15 ENCOUNTER — Encounter (HOSPITAL_BASED_OUTPATIENT_CLINIC_OR_DEPARTMENT_OTHER): Payer: 59 | Admitting: Anesthesiology

## 2014-05-15 ENCOUNTER — Ambulatory Visit (HOSPITAL_BASED_OUTPATIENT_CLINIC_OR_DEPARTMENT_OTHER)
Admission: RE | Admit: 2014-05-15 | Discharge: 2014-05-15 | Disposition: A | Discharge status: Home / Self Care | Payer: 59 | Source: Ambulatory Visit | Attending: Plastic Surgery | Admitting: Plastic Surgery

## 2014-05-15 ENCOUNTER — Ambulatory Visit (HOSPITAL_BASED_OUTPATIENT_CLINIC_OR_DEPARTMENT_OTHER): Payer: 59 | Admitting: Anesthesiology

## 2014-05-15 ENCOUNTER — Encounter (HOSPITAL_BASED_OUTPATIENT_CLINIC_OR_DEPARTMENT_OTHER): Payer: Self-pay | Admitting: Anesthesiology

## 2014-05-15 ENCOUNTER — Encounter (HOSPITAL_BASED_OUTPATIENT_CLINIC_OR_DEPARTMENT_OTHER)
Admission: RE | Disposition: A | Discharge status: Home / Self Care | Payer: Self-pay | Source: Ambulatory Visit | Attending: Plastic Surgery

## 2014-05-15 DIAGNOSIS — Z9013 Acquired absence of bilateral breasts and nipples: Secondary | ICD-10-CM | POA: Insufficient documentation

## 2014-05-15 DIAGNOSIS — T8131XD Disruption of external operation (surgical) wound, not elsewhere classified, subsequent encounter: Secondary | ICD-10-CM

## 2014-05-15 DIAGNOSIS — T8131XA Disruption of external operation (surgical) wound, not elsewhere classified, initial encounter: Secondary | ICD-10-CM | POA: Diagnosis not present

## 2014-05-15 HISTORY — PX: DEBRIDEMENT AND CLOSURE WOUND: SHX5614

## 2014-05-15 LAB — POCT HEMOGLOBIN-HEMACUE: Hemoglobin: 13.1 g/dL (ref 12.0–15.0)

## 2014-05-15 SURGERY — DEBRIDEMENT, WOUND, WITH CLOSURE
Anesthesia: General | Site: Breast | Laterality: Bilateral

## 2014-05-15 MED ORDER — PROMETHAZINE HCL 25 MG/ML IJ SOLN: 6.2500 mg | INTRAMUSCULAR | Status: DC | PRN

## 2014-05-15 MED ORDER — SUFENTANIL CITRATE 50 MCG/ML IV SOLN
INTRAVENOUS | Status: AC
  Filled 2014-05-15: qty 1

## 2014-05-15 MED ORDER — DEXAMETHASONE SODIUM PHOSPHATE 4 MG/ML IJ SOLN
INTRAMUSCULAR | Status: DC | PRN
  Administered 2014-05-15: 10 mg via INTRAVENOUS

## 2014-05-15 MED ORDER — MIDAZOLAM HCL 2 MG/2ML IJ SOLN
1.0000 mg | INTRAMUSCULAR | Status: DC | PRN
  Administered 2014-05-15: 2 mg via INTRAVENOUS

## 2014-05-15 MED ORDER — FENTANYL CITRATE 0.05 MG/ML IJ SOLN
INTRAMUSCULAR | Status: AC
  Filled 2014-05-15: qty 2

## 2014-05-15 MED ORDER — BACITRACIN ZINC 500 UNIT/GM EX OINT
TOPICAL_OINTMENT | CUTANEOUS | Status: AC
  Filled 2014-05-15: qty 28.35

## 2014-05-15 MED ORDER — LACTATED RINGERS IV SOLN
INTRAVENOUS | Status: DC
  Administered 2014-05-15 (×2): via INTRAVENOUS

## 2014-05-15 MED ORDER — FENTANYL CITRATE 0.05 MG/ML IJ SOLN
50.0000 ug | INTRAMUSCULAR | Status: DC | PRN
Start: 2014-05-15 — End: 2014-05-15
  Administered 2014-05-15: 100 ug via INTRAVENOUS

## 2014-05-15 MED ORDER — ONDANSETRON HCL 4 MG/2ML IJ SOLN
INTRAMUSCULAR | Status: DC | PRN
  Administered 2014-05-15: 4 mg via INTRAVENOUS

## 2014-05-15 MED ORDER — MIDAZOLAM HCL 2 MG/2ML IJ SOLN
INTRAMUSCULAR | Status: AC
  Filled 2014-05-15: qty 2

## 2014-05-15 MED ORDER — CEFAZOLIN SODIUM-DEXTROSE 2-3 GM-% IV SOLR
2.0000 g | INTRAVENOUS | Status: AC
  Administered 2014-05-15: 2 g via INTRAVENOUS

## 2014-05-15 MED ORDER — SUFENTANIL CITRATE 50 MCG/ML IV SOLN
INTRAVENOUS | Status: DC | PRN
  Administered 2014-05-15: 5 ug via INTRAVENOUS
  Administered 2014-05-15: 10 ug via INTRAVENOUS

## 2014-05-15 MED ORDER — LIDOCAINE HCL (CARDIAC) 20 MG/ML IV SOLN
INTRAVENOUS | Status: DC | PRN
  Administered 2014-05-15: 50 mg via INTRAVENOUS

## 2014-05-15 MED ORDER — HYDROMORPHONE HCL 1 MG/ML IJ SOLN
INTRAMUSCULAR | Status: AC
Start: 2014-05-15 — End: 2014-05-15
  Filled 2014-05-15: qty 1

## 2014-05-15 MED ORDER — SCOPOLAMINE 1 MG/3DAYS TD PT72
MEDICATED_PATCH | TRANSDERMAL | Status: AC
  Filled 2014-05-15: qty 1

## 2014-05-15 MED ORDER — HYDROMORPHONE HCL 1 MG/ML IJ SOLN
0.2500 mg | INTRAMUSCULAR | Status: DC | PRN
  Administered 2014-05-15: 0.5 mg via INTRAVENOUS

## 2014-05-15 MED ORDER — MIDAZOLAM HCL 5 MG/5ML IJ SOLN
INTRAMUSCULAR | Status: DC | PRN
  Administered 2014-05-15: 2 mg via INTRAVENOUS

## 2014-05-15 MED ORDER — OXYCODONE HCL 10 MG PO TABS: 10.0000 mg | ORAL_TABLET | ORAL | Status: DC | PRN

## 2014-05-15 MED ORDER — PROPOFOL 10 MG/ML IV BOLUS
INTRAVENOUS | Status: DC | PRN
  Administered 2014-05-15: 200 mg via INTRAVENOUS

## 2014-05-15 MED ORDER — EPHEDRINE SULFATE 50 MG/ML IJ SOLN
INTRAMUSCULAR | Status: DC | PRN
  Administered 2014-05-15: 10 mg via INTRAVENOUS

## 2014-05-15 MED ORDER — MIDAZOLAM HCL 2 MG/2ML IJ SOLN: 1.0000 mg | INTRAMUSCULAR | Status: DC | PRN

## 2014-05-15 MED ORDER — SULFAMETHOXAZOLE-TMP DS 800-160 MG PO TABS: 1.0000 | ORAL_TABLET | Freq: Two times a day (BID) | ORAL | Status: DC

## 2014-05-15 MED ORDER — SODIUM CHLORIDE 0.9 % IR SOLN
Status: DC | PRN
  Administered 2014-05-15: 10:00:00

## 2014-05-15 MED ORDER — FENTANYL CITRATE 0.05 MG/ML IJ SOLN: 50.0000 ug | INTRAMUSCULAR | Status: DC | PRN

## 2014-05-15 MED ORDER — SODIUM CHLORIDE 0.9 % IR SOLN
Status: DC | PRN
  Administered 2014-05-15: 1000 mL

## 2014-05-15 MED ORDER — CEFAZOLIN SODIUM-DEXTROSE 2-3 GM-% IV SOLR
INTRAVENOUS | Status: AC
  Filled 2014-05-15: qty 50

## 2014-05-15 MED ORDER — LIDOCAINE-EPINEPHRINE 1 %-1:100000 IJ SOLN
INTRAMUSCULAR | Status: AC
  Filled 2014-05-15: qty 1

## 2014-05-15 MED ORDER — SCOPOLAMINE 1 MG/3DAYS TD PT72
1.0000 | MEDICATED_PATCH | TRANSDERMAL | Status: DC
  Administered 2014-05-15: 1.5 mg via TRANSDERMAL

## 2014-05-15 MED ORDER — BUPIVACAINE-EPINEPHRINE (PF) 0.25% -1:200000 IJ SOLN
INTRAMUSCULAR | Status: AC
  Filled 2014-05-15: qty 30

## 2014-05-15 SURGICAL SUPPLY — 58 items
ADH SKN CLS APL DERMABOND .7 (GAUZE/BANDAGES/DRESSINGS)
APL SKNCLS STERI-STRIP NONHPOA (GAUZE/BANDAGES/DRESSINGS)
BAG DECANTER FOR FLEXI CONT (MISCELLANEOUS) ×1 IMPLANT
BENZOIN TINCTURE PRP APPL 2/3 (GAUZE/BANDAGES/DRESSINGS) IMPLANT
BINDER BREAST XLRG (GAUZE/BANDAGES/DRESSINGS) ×1 IMPLANT
BLADE CLIPPER SURG (BLADE) IMPLANT
BLADE SURG 10 STRL SS (BLADE) ×2 IMPLANT
BLADE SURG 15 STRL LF DISP TIS (BLADE) ×1 IMPLANT
BLADE SURG 15 STRL SS (BLADE) ×2
BRUSH SCRUB EZ PLAIN DRY (MISCELLANEOUS) ×1 IMPLANT
CANISTER SUCT 1200ML W/VALVE (MISCELLANEOUS) ×1 IMPLANT
COVER BACK TABLE 60X90IN (DRAPES) ×2 IMPLANT
COVER MAYO STAND STRL (DRAPES) ×2 IMPLANT
DERMABOND ADVANCED (GAUZE/BANDAGES/DRESSINGS)
DERMABOND ADVANCED .7 DNX12 (GAUZE/BANDAGES/DRESSINGS) IMPLANT
DRAPE U-SHAPE 76X120 STRL (DRAPES) ×2 IMPLANT
DRSG PAD ABDOMINAL 8X10 ST (GAUZE/BANDAGES/DRESSINGS) ×2 IMPLANT
DRSG TELFA 3X8 NADH (GAUZE/BANDAGES/DRESSINGS) ×2 IMPLANT
ELECT COATED BLADE 2.86 ST (ELECTRODE) IMPLANT
ELECT NDL BLADE 2-5/6 (NEEDLE) ×1 IMPLANT
ELECT NEEDLE BLADE 2-5/6 (NEEDLE) ×2 IMPLANT
ELECT REM PT RETURN 9FT ADLT (ELECTROSURGICAL) ×2
ELECT REM PT RETURN 9FT PED (ELECTROSURGICAL)
ELECTRODE REM PT RETRN 9FT PED (ELECTROSURGICAL) IMPLANT
ELECTRODE REM PT RTRN 9FT ADLT (ELECTROSURGICAL) IMPLANT
GLOVE BIO SURGEON STRL SZ 6 (GLOVE) ×3 IMPLANT
GLOVE BIOGEL PI IND STRL 7.0 (GLOVE) IMPLANT
GLOVE BIOGEL PI INDICATOR 7.0 (GLOVE) ×1
GLOVE ECLIPSE 6.5 STRL STRAW (GLOVE) ×1 IMPLANT
GLOVE EXAM NITRILE EXT CUFF MD (GLOVE) ×1 IMPLANT
GOWN STRL REUS W/ TWL LRG LVL3 (GOWN DISPOSABLE) ×2 IMPLANT
GOWN STRL REUS W/TWL LRG LVL3 (GOWN DISPOSABLE) ×4
KIT FILL SYSTEM UNIVERSAL (SET/KITS/TRAYS/PACK) ×1 IMPLANT
NDL 21 GA WING INFUSION (NEEDLE) IMPLANT
NEEDLE 21 GA WING INFUSION (NEEDLE) ×2 IMPLANT
NEEDLE 27GAX1X1/2 (NEEDLE) ×2 IMPLANT
NS IRRIG 1000ML POUR BTL (IV SOLUTION) ×1 IMPLANT
PACK BASIN DAY SURGERY FS (CUSTOM PROCEDURE TRAY) ×2 IMPLANT
PAD DRESSING TELFA 3X8 NADH (GAUZE/BANDAGES/DRESSINGS) IMPLANT
PENCIL BUTTON HOLSTER BLD 10FT (ELECTRODE) ×1 IMPLANT
SHEET MEDIUM DRAPE 40X70 STRL (DRAPES) ×1 IMPLANT
SPONGE GAUZE 4X4 12PLY STER LF (GAUZE/BANDAGES/DRESSINGS) IMPLANT
SPONGE LAP 18X18 X RAY DECT (DISPOSABLE) ×2 IMPLANT
STRIP CLOSURE SKIN 1/2X4 (GAUZE/BANDAGES/DRESSINGS) IMPLANT
SUCTION FRAZIER TIP 10 FR DISP (SUCTIONS) IMPLANT
SUT CHROMIC 4 0 PS 2 18 (SUTURE) IMPLANT
SUT ETHILON 4 0 PS 2 18 (SUTURE) ×3 IMPLANT
SUT ETHILON 5 0 P 3 18 (SUTURE)
SUT MNCRL AB 4-0 PS2 18 (SUTURE) IMPLANT
SUT NYLON ETHILON 5-0 P-3 1X18 (SUTURE) IMPLANT
SUT PLAIN 5 0 P 3 18 (SUTURE) IMPLANT
SUT VICRYL 4-0 PS2 18IN ABS (SUTURE) ×3 IMPLANT
SYR BULB 3OZ (MISCELLANEOUS) ×1 IMPLANT
SYR CONTROL 10ML LL (SYRINGE) ×2 IMPLANT
TOWEL OR 17X24 6PK STRL BLUE (TOWEL DISPOSABLE) ×3 IMPLANT
TRAY DSU PREP LF (CUSTOM PROCEDURE TRAY) ×3 IMPLANT
TUBE CONNECTING 20X1/4 (TUBING) ×1 IMPLANT
YANKAUER SUCT BULB TIP NO VENT (SUCTIONS) ×1 IMPLANT

## 2014-05-15 NOTE — Brief Op Note (Signed)
05/15/2014  10:25 AM  PATIENT:  Krystal [name]  48 y.o. female  PRE-OPERATIVE DIAGNOSIS:  Acquired absence of breast and nipple, bilateral [Z90.13] Malignant neoplasm of left breast [C50.912]  POST-OPERATIVE DIAGNOSIS:  Acquired absence of breast and nipple, bilateral [Z90.13] SKIN NECROSIS  PROCEDURE:  Procedure(s): DEBRIDEMENT AND CLOSURE OF BILATERAL MASECTOMY INCISIONS WITH BREAST EXPANSION (Bilateral)  SURGEON:  Surgeon(s) and Role:    * Irene Limbo, MD - Primary  PHYSICIAN ASSISTANT:   ASSISTANTS: none   ANESTHESIA:   general  EBL:  Total I/O In: 1000 [I.V.:1000] Out: -   BLOOD ADMINISTERED:none  DRAINS: none   LOCAL MEDICATIONS USED:  NONE  SPECIMEN:  No Specimen  DISPOSITION OF SPECIMEN:  N/A  COUNTS:  YES  TOURNIQUET:  * No tourniquets in log *  DICTATION: .Note written in EPIC  PLAN OF CARE: Discharge to home after PACU  PATIENT DISPOSITION:  PACU - hemodynamically stable.   Delay start of Pharmacological VTE agent (>24hrs) due to surgical blood loss or risk of bleeding: not applicable

## 2014-05-15 NOTE — Anesthesia Procedure Notes (Signed)
Procedure Name: LMA Insertion Date/Time: 05/15/2014 9:30 AM Performed by: Lieutenant Diego Pre-anesthesia Checklist: Patient identified, Emergency Drugs available, Suction available and Patient being monitored Patient Re-evaluated:Patient Re-evaluated prior to inductionOxygen Delivery Method: Circle System Utilized Preoxygenation: Pre-oxygenation with 100% oxygen Intubation Type: IV induction Ventilation: Mask ventilation without difficulty LMA: LMA inserted LMA Size: 4.0 Number of attempts: 1 Airway Equipment and Method: bite block Placement Confirmation: positive ETCO2 and breath sounds checked- equal and bilateral Tube secured with: Tape Dental Injury: Teeth and Oropharynx as per pre-operative assessment

## 2014-05-15 NOTE — Discharge Instructions (Signed)
°  Post Anesthesia Home Care Instructions ° °Activity: °Get plenty of rest for the remainder of the day. A responsible adult should stay with you for 24 hours following the procedure.  °For the next 24 hours, DO NOT: °-Drive a car °-Operate machinery °-Drink alcoholic beverages °-Take any medication unless instructed by your physician °-Make any legal decisions or sign important papers. ° °Meals: °Start with liquid foods such as gelatin or soup. Progress to regular foods as tolerated. Avoid greasy, spicy, heavy foods. If nausea and/or vomiting occur, drink only clear liquids until the nausea and/or vomiting subsides. Call your physician if vomiting continues. ° °Special Instructions/Symptoms: °Your throat may feel dry or sore from the anesthesia or the breathing tube placed in your throat during surgery. If this causes discomfort, gargle with warm salt water. The discomfort should disappear within 24 hours. ° ° °Call your surgeon if you experience:  ° °1.  Fever over 101.0. °2.  Inability to urinate. °3.  Nausea and/or vomiting. °4.  Extreme swelling or bruising at the surgical site. °5.  Continued bleeding from the incision. °6.  Increased pain, redness or drainage from the incision. °7.  Problems related to your pain medication. °8. Any change in color, movement and/or sensation °9. Any problems and/or concerns ° ° ° °

## 2014-05-15 NOTE — Anesthesia Preprocedure Evaluation (Addendum)
Anesthesia Evaluation  Patient identified by MRN, date of birth, ID band Patient awake    Reviewed: Allergy & Precautions, H&P , NPO status , Patient's Chart, lab work & pertinent test results  History of Anesthesia Complications (+) PONV  Airway Mallampati: I   Neck ROM: Full    Dental  (+) Teeth Intact   Pulmonary former smoker,  breath sounds clear to auscultation        Cardiovascular negative cardio ROS  Rhythm:Regular Rate:Normal     Neuro/Psych negative neurological ROS     GI/Hepatic negative GI ROS, Neg liver ROS,   Endo/Other  diabetes  Renal/GU negative Renal ROS     Musculoskeletal   Abdominal (+) + obese,   Peds  Hematology   Anesthesia Other Findings   Reproductive/Obstetrics                             Anesthesia Physical  Anesthesia Plan  ASA: II  Anesthesia Plan: General   Post-op Pain Management:    Induction: Intravenous  Airway Management Planned: Oral ETT  Additional Equipment:   Intra-op Plan:   Post-operative Plan: Extubation in OR  Informed Consent: I have reviewed the patients History and Physical, chart, labs and discussed the procedure including the risks, benefits and alternatives for the proposed anesthesia with the patient or authorized representative who has indicated his/her understanding and acceptance.   Dental advisory given  Plan Discussed with: CRNA and Surgeon  Anesthesia Plan Comments:         Anesthesia Quick Evaluation  

## 2014-05-15 NOTE — Anesthesia Postprocedure Evaluation (Signed)
  Anesthesia Post-op Note  Patient: Krystal [name]  Procedure(s) Performed: Procedure(s): DEBRIDEMENT AND CLOSURE OF BILATERAL MASECTOMY INCISIONS WITH BREAST EXPANSION (Bilateral)  Patient Location: PACU  Anesthesia Type:General  Level of Consciousness: awake and alert   Airway and Oxygen Therapy: Patient Spontanous Breathing  Post-op Pain: mild  Post-op Assessment: Post-op Vital signs reviewed  Post-op Vital Signs: stable  Last Vitals:  Filed Vitals:   05/15/14 1237  BP: 118/68  Pulse: 77  Temp: 36.8 C  Resp: 18    Complications: No apparent anesthesia complications

## 2014-05-15 NOTE — Transfer of Care (Signed)
Immediate Anesthesia Transfer of Care Note  Patient: Krystal [name]  Procedure(s) Performed: Procedure(s): DEBRIDEMENT AND CLOSURE OF BILATERAL MASECTOMY INCISIONS WITH BREAST EXPANSION (Bilateral)  Patient Location: PACU  Anesthesia Type:General  Level of Consciousness: sedated  Airway & Oxygen Therapy: Patient Spontanous Breathing and Patient connected to face mask oxygen  Post-op Assessment: Report given to PACU RN and Post -op Vital signs reviewed and stable  Post vital signs: Reviewed and stable  Complications: No apparent anesthesia complications

## 2014-05-15 NOTE — Interval H&P Note (Signed)
History and Physical Interval Note:  05/15/2014 9:01 AM  Krystal [name]  has presented today for surgery, with the diagnosis of Acquired absence of breast and nipple, bilateral [Z90.13] Malignant neoplasm of left breast [C50.912]  The various methods of treatment have been discussed with the patient and family. After consideration of risks, benefits and other options for treatment, the patient has consented to  Procedure(s): DEBRIDEMENT AND CLOSURE OF BILATERAL MASECTOMY INCISION  (Bilateral) as a surgical intervention .  The patient's history has been reviewed, patient examined, no change in status, stable for surgery.  I have reviewed the patient's chart and labs.  Questions were answered to the patient's satisfaction.     [name]

## 2014-05-15 NOTE — Op Note (Signed)
Operative Note   DATE OF OPERATION: 05/15/2014  LOCATION: [facility]- outpatient  SURGICAL DIVISION: Plastic Surgery  PREOPERATIVE DIAGNOSES:  1. Acquired absence bilateral breasts 2. BRCA1 3. Dehiscence of bilateral mastectomy incisions  POSTOPERATIVE DIAGNOSES:  same  PROCEDURE:  Complex repair chest, total 15 cm  SURGEON: Irene Limbo MD MBA  ASSISTANT: none  ANESTHESIA:  General.   EBL: minimal  COMPLICATIONS: None.   INDICATIONS FOR PROCEDURE:  The patient, Krystal [name], is a 48 y.o. female born on 03-20-1966, is here for debridement and closure of bilateral mastectomy incisions following immediate reconstruction.   FINDINGS: Marginal necrosis of mastectomy incisions with separation to superficial fascia. No exposure expanders.   DESCRIPTION OF PROCEDURE:  The patient was taken to the operating room. SCDs were placed and IV antibiotics were given. The patient's operative site was prepped and draped in a sterile fashion. A time out was performed and all information was confirmed to be correct.  Sharp excision of skin and subcutaneous tissue with knife and scissors completed along bilateral mastectomy scars. Superficial fascia repair intact without exposure of expanders bilaterally. Wounds irrigated with saline containing bacitracin and polymyxin. Layered closure completed in similar fashion over both reconstructed breasts with 4-0 vicryl interrupted in dermis and running 4-0 nylon for skin closure. Telfa and dry dressing, breast binder applied.  The patient was allowed to wake from anesthesia, extubated and taken to the recovery room in satisfactory condition.   SPECIMENS: none  DRAINS: none  Irene Limbo, MD [facility] Plastic & Reconstructive Surgery [phone]

## 2014-05-18 ENCOUNTER — Encounter (HOSPITAL_BASED_OUTPATIENT_CLINIC_OR_DEPARTMENT_OTHER): Payer: Self-pay | Admitting: Plastic Surgery

## 2014-05-18 NOTE — Discharge Summary (Signed)
Physician Discharge Summary  Patient ID: Krystal [name] MRN: [id number] DOB/AGE: 12/25/1965 48 y.o.  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 She also had mm found left breast cancer. She has undergone wle of left back melanoma and bilateral skin sparing mastectomy with expander reconstruction.  She also underwent left axillary sentinel node biopsy (this is both for breast and this is where melanoma drained on lymphoscinitgraphy). This was done at day surgery but she had pain control issues and was transferred as inpatient to [place]. She gradually advanced her diet and was transitioned to po pain medications. She was also able to increase activity enough to go home. Her incisions and drains remained as expected.  Consults: None  Significant Diagnostic Studies: none   Treatments: surgery: wide local excision back melanoma, left ssm, right ssm, left axillary sn biopsy, bilateral expander reconstruction   Disposition: 01-Home or Self Care  Discharge Instructions   Call MD for:  redness, tenderness, or signs of infection (pain, swelling, bleeding, redness, odor or green/yellow discharge around incision site)    Complete by:  As directed      Call MD for:  severe or increased pain, loss or decreased feeling  in affected limb(s)    Complete by:  As directed      Discharge instructions    Complete by:  As directed   Ok to shower. Pat incisions dry. Soap and water ok.   JPs to bulb suction, strip and record q 12 hrs and bring log to clinic visit.  Dry dressing as desired over incisions. Breast binder or soft support bra all times.  No strenuous activity or exercise.     Driving Restrictions    Complete by:  As  directed   No driving while taking narcotics     Lifting restrictions    Complete by:  As directed   No lifting greater than 5 lbs     Resume previous diet    Complete by:  As directed             Medication List    STOP taking these medications       zolpidem 10 MG tablet  Commonly known as:  AMBIEN      TAKE these medications       DSS 100 MG Caps  Take 100 mg by mouth 2 (two) times daily.     methocarbamol 500 MG tablet  Commonly known as:  ROBAXIN  Take 1 tablet (500 mg total) by mouth every 8 (eight) hours as needed for muscle spasms (after the scheduled doses for next 24 hours are complete).     Oxycodone HCl 10 MG Tabs  Take 1-2 tablets (10-20 mg total) by mouth every 4 (four) hours as needed for moderate pain.           Follow-up Information   Follow up with THIMMAPPA, BRINDA, MD. Schedule an appointment as soon as possible for a visit in 1 week.   Specialty:  Plastic Surgery   Contact information:   [address] [place] [address] [phone]       Follow up with WAKEFIELD,MATTHEW, MD In 1 week.   Specialty:  General Surgery   Contact information:   [address]   [address] [place] [address] [phone]       Signed: Rolm Bookbinder 05/18/2014, 6:11 PM

## 2014-05-20 ENCOUNTER — Encounter (HOSPITAL_COMMUNITY): Payer: Self-pay

## 2014-05-20 ENCOUNTER — Encounter: Payer: Self-pay | Admitting: *Deleted

## 2014-05-20 NOTE — Progress Notes (Signed)
Received Oncotype Dx results of 13.  Placed a copy in Dr. Geralyn Flash box and took a copy to HIM to scan.

## 2014-05-26 ENCOUNTER — Ambulatory Visit (HOSPITAL_BASED_OUTPATIENT_CLINIC_OR_DEPARTMENT_OTHER): Payer: 59 | Admitting: Hematology and Oncology

## 2014-05-26 ENCOUNTER — Encounter: Payer: Self-pay | Admitting: Hematology and Oncology

## 2014-05-26 ENCOUNTER — Telehealth: Payer: Self-pay | Admitting: Hematology and Oncology

## 2014-05-26 VITALS — BP 122/75 | HR 83 | Temp 99.1°F | Resp 18 | Ht 64.0 in | Wt 232.9 lb

## 2014-05-26 DIAGNOSIS — Z85820 Personal history of malignant melanoma of skin: Secondary | ICD-10-CM

## 2014-05-26 DIAGNOSIS — C50112 Malignant neoplasm of central portion of left female breast: Secondary | ICD-10-CM

## 2014-05-26 MED ORDER — TAMOXIFEN CITRATE 20 MG PO TABS: 20.0000 mg | ORAL_TABLET | Freq: Every day | ORAL | Status: DC

## 2014-05-26 NOTE — Progress Notes (Signed)
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 N0 M0 stage IA: Oncotype DX 13 (ROR 9%)   04/16/2014 Surgery Left upper back melanoma resected no residual melanoma identified on re-resection margins negative   05/26/2014 -  Anti-estrogen oral therapy Tamoxifen 20 mg daily with a plan to switch her to aromatase inhibitors once she gets oophorectomy    CHIEF COMPLIANT: Recent wound closure  INTERVAL HISTORY: Krystal [name] is a 48 year old Caucasian with above-mentioned history of BRCA1 positive left breast cancer treated with bilateral mastectomies. Should multifocal disease. Oncotype DX done on the tumor reveals she has low risk with a score of 13 and 9% risk of recurrence. She is here today to discuss the results of the Oncotype DX and to discuss adjuvant therapy options. Recently her wound opened up and she now has stitches but will need to be removed soon. Continuing breast reconstruction with Dr. Leland Johns  REVIEW OF SYSTEMS:   Constitutional: Denies fevers, chills or abnormal weight loss Eyes: Denies blurriness of vision Ears, nose, mouth, throat, and face: Denies mucositis or sore throat Respiratory: Denies cough, dyspnea or wheezes Cardiovascular: Denies palpitation, chest discomfort or lower extremity swelling Gastrointestinal:  Denies nausea, heartburn or change in bowel habits Skin: Denies abnormal skin  rashes Lymphatics: Denies new lymphadenopathy or easy bruising Neurological:Denies numbness, tingling or new weaknesses Behavioral/Psych: Mood is stable, no new changes  Breast: Undergoing breast reconstruction All other systems were reviewed with the patient and are negative.  I have reviewed the past medical history, past surgical history, social history and family history with the patient and they are unchanged from previous note.  ALLERGIES:  is allergic to codeine.  MEDICATIONS:  Current Outpatient Prescriptions  Medication Sig Dispense Refill  . docusate sodium 100 MG CAPS Take 100 mg by mouth 2 (two) times daily.  40 capsule  0  . methocarbamol (ROBAXIN) 500 MG tablet Take 1 tablet (500 mg total) by mouth every 8 (eight) hours as needed for muscle spasms (after the scheduled doses for next 24 hours are complete).  30 tablet  0  . ondansetron (ZOFRAN) 4 MG tablet 4 mg every 6 (six) hours as needed.      Marland Kitchen oxyCODONE 10 MG TABS Take 1-2 tablets (10-20 mg total) by mouth every 4 (four) hours as needed for moderate pain.  40 tablet  0  . Oxycodone HCl (ROXICODONE) 10 MG TABS Take 1-2 tablets (10-20 mg total) by mouth every 4 (four) hours as needed for severe pain.  50 tablet  0  . sulfamethoxazole-trimethoprim (BACTRIM DS) 800-160 MG per tablet Take 1 tablet by mouth 2 (two) times daily.  14 tablet  0  . tamoxifen (NOLVADEX) 20 MG tablet Take 1 tablet (20 mg total) by mouth daily.  90 tablet  1   No current facility-administered medications for this visit.    PHYSICAL EXAMINATION: ECOG PERFORMANCE STATUS: 1 -  Symptomatic but completely ambulatory  Filed Vitals:   05/26/14 0825  BP: 122/75  Pulse: 83  Temp: 99.1 F (37.3 C)  Resp: 18   Filed Weights   05/26/14 0825  Weight: 232 lb 14.4 oz (105.643 kg)    GENERAL:alert, no distress and comfortable SKIN: skin color, texture, turgor are normal, no rashes or significant lesions EYES: normal, Conjunctiva are pink and  non-injected, sclera clear OROPHARYNX:no exudate, no erythema and lips, buccal mucosa, and tongue normal  NECK: supple, thyroid normal size, non-tender, without nodularity LYMPH:  no palpable lymphadenopathy in the cervical, axillary or inguinal LUNGS: clear to auscultation and percussion with normal breathing effort HEART: regular rate & rhythm and no murmurs and no lower extremity edema ABDOMEN:abdomen soft, non-tender and normal bowel sounds Musculoskeletal:no cyanosis of digits and no clubbing  NEURO: alert & oriented x 3 with fluent speech, no focal motor/sensory deficits  LABORATORY DATA:  I have reviewed the data as listed   Chemistry      Component Value Date/Time   NA 140 04/14/2014 1108   K 4.9 04/14/2014 1108   CL 100 04/14/2014 1108   CO2 26 04/14/2014 1108   BUN 9 04/14/2014 1108   CREATININE 0.70 04/14/2014 1108      Component Value Date/Time   CALCIUM 9.0 04/14/2014 1108   ALKPHOS 68 04/14/2014 1108   AST 14 04/14/2014 1108   ALT 15 04/14/2014 1108   BILITOT 0.2* 04/14/2014 1108       Lab Results  Component Value Date   WBC 6.6 04/14/2014   HGB 13.1 05/15/2014   HCT 39.5 04/14/2014   MCV 87.4 04/14/2014   PLT 301 04/14/2014   NEUTROABS 4.8 04/14/2014    ASSESSMENT & PLAN:  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 N0 M0 stage IA ER 100%, PR 95%, HER-2 negative ratio 1.9, Ki-67 15% with additional satellite nodule 0.6 cm: Oncotype DX recurrence score is 13; 9% risk of recurrence I discussed the Oncotype DX recurrence score, since it's low risk, should this not be systemic chemotherapy. Patient can proceed to undergo bilateral breast reconstruction as well as oophorectomy. I discussed with her that we will start her on tamoxifen therapy. Once he gets oophorectomy, we can switch her to aromatase inhibitor therapy. Patient to do oophorectomy with hysterectomy in 6  months once the breast reconstruction is complete.  Tamoxifen counseling: I discussed these symptoms the risks and benefits of tamoxifen and she understands these risks and is willing to stop treatment I sent a prescription for 90 days with one refill this will complete 6 months of therapy at which time if she gets oophorectomy we can switch her to aromatase inhibitors.    Orders Placed This Encounter  Procedures  . CBC with Differential    Standing Status: Future     Number of Occurrences:      Standing Expiration Date: 05/26/2015  . Comprehensive metabolic panel (Cmet) - [facility]    Standing Status: Future     Number of Occurrences:      Standing Expiration Date: 05/26/2015   The patient has a good understanding of the overall plan. she agrees with it. She will call with any problems that may develop before her next visit here.  I spent 20 minutes counseling the patient face to face. The total time spent in the appointment was 25 minutes and more than 50% was  on counseling and review of test results    Rulon Eisenmenger, MD 05/26/2014 8:52 AM

## 2014-05-26 NOTE — Assessment & Plan Note (Addendum)
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 N0 M0 stage IA ER 100%, PR 95%, HER-2 negative ratio 1.9, Ki-67 15% with additional satellite nodule 0.6 cm: Oncotype DX recurrence score is 13; 9% risk of recurrence I discussed the Oncotype DX recurrence score, since it's low risk, should this not be systemic chemotherapy. Patient can proceed to undergo bilateral breast reconstruction as well as oophorectomy. I discussed with her that we will start her on tamoxifen therapy. Once he gets oophorectomy, we can switch her to aromatase inhibitor therapy. Patient to do oophorectomy with hysterectomy in 6 months once the breast reconstruction is complete.  Tamoxifen counseling: I discussed these symptoms the risks and benefits of tamoxifen and she understands these risks and is willing to stop treatment I sent a prescription for 90 days with one refill this will complete 6 months of therapy at which time if she gets oophorectomy we can switch her to aromatase inhibitors.

## 2014-05-26 NOTE — Telephone Encounter (Signed)
per pof to sch pt appt-gave pt copy of appt °

## 2014-06-09 ENCOUNTER — Telehealth: Payer: Self-pay

## 2014-06-09 NOTE — Telephone Encounter (Signed)
LMOVM - pt to return call to clinic re: itching.

## 2014-06-10 ENCOUNTER — Telehealth: Payer: Self-pay

## 2014-06-10 ENCOUNTER — Telehealth: Payer: Self-pay | Admitting: Hematology and Oncology

## 2014-06-10 NOTE — Telephone Encounter (Signed)
Per 11/04 pof pt is aware appt d/t.

## 2014-06-10 NOTE — Telephone Encounter (Signed)
Returned patient call re: itching since Sept 2015.  Constant, awakening from sleep. Has checked with surgeon, tried creams, hydroxyzine 10 mg, benadryl.  Per Dr Lindi Adie, hold tamoxifen and bring pt in to discuss.  Appt made for 11/17 at 9 am.  Pt voiced understanding.  pof sent

## 2014-06-11 ENCOUNTER — Other Ambulatory Visit: Payer: Self-pay | Admitting: Physician Assistant

## 2014-06-23 ENCOUNTER — Telehealth: Payer: Self-pay | Admitting: Hematology and Oncology

## 2014-06-23 ENCOUNTER — Ambulatory Visit (HOSPITAL_BASED_OUTPATIENT_CLINIC_OR_DEPARTMENT_OTHER): Payer: 59 | Admitting: Hematology and Oncology

## 2014-06-23 VITALS — BP 122/78 | HR 85 | Temp 98.9°F | Resp 18 | Ht 64.0 in | Wt 232.1 lb

## 2014-06-23 DIAGNOSIS — C50112 Malignant neoplasm of central portion of left female breast: Secondary | ICD-10-CM

## 2014-06-23 NOTE — Progress Notes (Signed)
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 N0 M0 stage IA: Oncotype DX 13 (ROR 9%)   04/16/2014 Surgery Left upper back melanoma resected no residual melanoma identified on re-resection margins negative   05/26/2014 -  Anti-estrogen oral therapy Tamoxifen 20 mg daily with a plan to switch her to aromatase inhibitors once she gets oophorectomy    CHIEF COMPLIANT: followup of breast cancer and tamoxifen  INTERVAL HISTORY: Krystal [name] is a 48 year old Caucasian with above-mentioned history of left breast cancer with a BRCA1 mutation. She was treated with a lateral mastectomies. Left breast had multifocal invasive ductal carcinoma. She had low risk Oncotype DX and did not need adjuvant chemotherapy. She was started on tamoxifen 05/26/2014. But she stopped it 06/07/2014. She called [place] complaining of severe itching on her hands and feet. She had seen a dermatologist who did a skin biopsy and determined that it was a drug reaction. In spite of being off tamoxifen on all of her other medications including pain pills as well as Bactrim, she still continues to have moderate degree of itching. She was prescribed steroids which have helped her symptoms somewhat.  REVIEW OF SYSTEMS:   Constitutional: Denies fevers, chills or abnormal weight loss Eyes: Denies blurriness of vision Ears, nose, mouth, throat, and face: Denies mucositis or sore  throat Respiratory: Denies cough, dyspnea or wheezes Cardiovascular: Denies palpitation, chest discomfort or lower extremity swelling Gastrointestinal:  Denies nausea, heartburn or change in bowel habits Skin: itching of the skin and skin rash in her hands and legs Lymphatics: Denies new lymphadenopathy or easy bruising Neurological:Denies numbness, tingling or new weaknesses Behavioral/Psych: Mood is stable, no new changes  All other systems were reviewed with the patient and are negative.  I have reviewed the past medical history, past surgical history, social history and family history with the patient and they are unchanged from previous note.  ALLERGIES:  is allergic to codeine.  MEDICATIONS:  Current Outpatient Prescriptions  Medication Sig Dispense Refill  . hydrOXYzine (ATARAX/VISTARIL) 10 MG tablet Take 20 mg by mouth at bedtime as needed for itching.    . predniSONE (DELTASONE) 5 MG tablet Take 5 mg by mouth daily with breakfast.    . sulfamethoxazole-trimethoprim (BACTRIM DS) 800-160 MG per tablet Take 1 tablet by mouth 2 (two) times daily. 14 tablet 0  . triamcinolone cream (KENALOG) 0.1 %   2  . tamoxifen (NOLVADEX) 20 MG tablet Take 1 tablet (20 mg total) by mouth daily. 90 tablet 1  . zolpidem (AMBIEN) 10 MG tablet Take 10 mg by mouth at bedtime as needed.  0   No current facility-administered medications for this visit.    PHYSICAL EXAMINATION: ECOG PERFORMANCE STATUS: 1 - Symptomatic but completely ambulatory  Filed Vitals:   06/23/14 0925  BP: 122/78  Pulse: 85  Temp: 98.9 F (37.2 C)  Resp: 18   Filed Weights   06/23/14 0925  Weight: 232 lb 1.6 oz (105.28 kg)    GENERAL:alert, no distress and comfortable SKIN: skin color, texture, turgor are normal, no rashes or significant lesions EYES: normal, Conjunctiva are pink and non-injected, sclera clear OROPHARYNX:no exudate, no erythema and lips, buccal mucosa, and tongue normal  NECK: supple, thyroid  normal size, non-tender, without nodularity LYMPH:  no palpable lymphadenopathy in the cervical, axillary or inguinal LUNGS: clear to auscultation and percussion with normal breathing effort HEART: regular rate & rhythm and no murmurs and no lower extremity edema ABDOMEN:abdomen soft, non-tender and normal bowel sounds Musculoskeletal:no cyanosis of digits and no clubbing  NEURO: alert & oriented x 3 with fluent speech, no focal motor/sensory deficits Skin: Rash on the arms from scratching   LABORATORY DATA:  I have reviewed the data as listed   Chemistry      Component Value Date/Time   NA 140 04/14/2014 1108   K 4.9 04/14/2014 1108   CL 100 04/14/2014 1108   CO2 26 04/14/2014 1108   BUN 9 04/14/2014 1108   CREATININE 0.70 04/14/2014 1108      Component Value Date/Time   CALCIUM 9.0 04/14/2014 1108   ALKPHOS 68 04/14/2014 1108   AST 14 04/14/2014 1108   ALT 15 04/14/2014 1108   BILITOT 0.2* 04/14/2014 1108       Lab Results  Component Value Date   WBC 6.6 04/14/2014   HGB 13.1 05/15/2014   HCT 39.5 04/14/2014   MCV 87.4 04/14/2014   PLT 301 04/14/2014   NEUTROABS 4.8 04/14/2014      ASSESSMENT & PLAN:  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 N0 M0 stage IA ER 100%, PR 95%, HER-2 negative ratio 1.9, Ki-67 15% with additional satellite nodule 0.6 cm: Oncotype DX recurrence score is 13 9% risk of recurrence  Tamoxifen counseling: Patient was started on tamoxifen 05/26/2014 but she stopped taking it 06/07/2014. She was having itching sensation throughout her arms and legs but it is unclear that it is related to tamoxifen. She reports that she had some itching even prior to starting tamoxifen. She had stopped all of her medications and she still has moderate degree of itching. She is seeing dermatology who would prescribe her steroids as well as  topical cortisone cream. It appears that her symptoms are slightly better. She was given a second prescription of steroids.  I discussed with her that oophorectomy is an excellent way to prevent risk of ovarian cancer she will discuss with her gynecologist to do oophorectomy versus doing complete hysterectomy medication. She also plans to undergo breast reconstruction and discussed with Dr. Leland Johns regarding timing for this process.  Return to clinic in 3 months for followup to decide whether she should go on aromatase inhibitor therapy. Until then I will hold off on continuing tamoxifen. No orders of the defined types were placed in this encounter.   The patient has a good understanding of the overall plan. she agrees with it. She will call with any problems that may develop before her next visit here.    Rulon Eisenmenger, MD 06/23/2014 9:59 AM

## 2014-06-23 NOTE — Assessment & Plan Note (Signed)
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 N0 M0 stage IA ER 100%, PR 95%, HER-2 negative ratio 1.9, Ki-67 15% with additional satellite nodule 0.6 cm: Oncotype DX recurrence score is 13 9% risk of recurrence  Tamoxifen counseling: Patient was started on tamoxifen 05/26/2014 but she stopped taking it 06/07/2014. She was having itching sensation throughout her arms and legs but it is unclear that it is related to tamoxifen. She reports that she had some itching even prior to starting tamoxifen. She had stopped all of her medications and she still has moderate degree of itching. She is seeing dermatology who would prescribe her steroids as well as topical cortisone cream. It appears that her symptoms are slightly better. She was given a second prescription of steroids.  I discussed with her that oophorectomy is an excellent way to prevent risk of ovarian cancer she will discuss with her gynecologist to do oophorectomy versus doing complete hysterectomy medication. She also plans to undergo breast reconstruction and discussed with Dr. Leland Johns regarding timing for this process.

## 2014-07-14 ENCOUNTER — Ambulatory Visit: Admit: 2014-07-14 | Payer: Self-pay | Admitting: Obstetrics and Gynecology

## 2014-07-14 SURGERY — HYSTERECTOMY, VAGINAL, LAPAROSCOPY-ASSISTED
Anesthesia: General

## 2014-08-04 ENCOUNTER — Encounter (HOSPITAL_BASED_OUTPATIENT_CLINIC_OR_DEPARTMENT_OTHER): Payer: Self-pay | Admitting: *Deleted

## 2014-08-06 ENCOUNTER — Encounter (HOSPITAL_BASED_OUTPATIENT_CLINIC_OR_DEPARTMENT_OTHER): Payer: Self-pay | Admitting: *Deleted

## 2014-08-06 NOTE — Progress Notes (Signed)
NPO AFTER MN. ARRIVE AT 0600. PT IS GETTING LAB WORK DONE ON Monday 08-10-2013. PT AWARE OWER AT MAIN.

## 2014-08-06 NOTE — H&P (Signed)
  Patient name  Krystal [name], Krystal [name]  DICTATION#  [id number] CSN# [id number]  Darlyn Chamber, MD 08/06/2014 8:48 AM

## 2014-08-06 NOTE — H&P (Signed)
NAME:  Krystal [name], Krystal [name] NO.:  [id number]  MEDICAL RECORD NO.:  [id number]  LOCATION:                                 FACILITY:  PHYSICIAN:  Darlyn Chamber, M.D.   DATE OF BIRTH:  1965/12/22  DATE OF ADMISSION: DATE OF DISCHARGE:                             HISTORY & PHYSICAL   The patient is a 48 year old, gravida 4, para 3, female who comes in for laparoscopic-assisted vaginal hysterectomy with bilateral salpingo- oophorectomy.  In relation to the present admission, the patient had a strong family history of breast cancer.  When we saw her back in July, she did have a left breast nodularity.  Subsequent biopsy did reveal invasive ductal carcinoma in situ.  She underwent genetic testing and was positive for a BRCA1 mutation.  Because of this, in September, she underwent bilateral skin sparing mastectomies and subsequent placement of an implant and was subsequently referred back to [place].  We did do a pelvic ultrasound which was unremarkable and her CA-125 was normal.  It was recommended by GYN and Oncology, she undergo laparoscopic-assisted vaginal hysterectomy, bilateral salpingo-oophorectomy in view of her BRCA mutation as noted. She comes in at the present time for that surgery.  ALLERGIES:  In terms of allergies, she is allergic to CODEINE.  MEDICATIONS:  She is on tamoxifen.  PAST MEDICAL HISTORY:  Did have gestational diabetes, otherwise usual childhood diseases without any significant sequelae.  PREVIOUS SURGICAL HISTORY:  She has had a cholecystectomy in 1995.  She has had 3 cesarean sections, the last one with a bilateral tubal ligation.  She has had bilateral skin sparing mastectomies with subsequent implants.  Because of some breakdown skin incision those were revised.  She also have an excision of a melanoma from her back.  SOCIAL HISTORY:  Reveals no tobacco, alcohol use.  FAMILY HISTORY:  Significant that a paternal grandmother and  paternal aunt had a history of breast cancer.  REVIEW OF SYSTEMS:  Noncontributory.  PHYSICAL EXAMINATION:  VITAL SIGNS:  The patient is afebrile.  Stable vital signs. HEENT:  Normocephalic.  Pupils equal, round, reactive to light and accommodation.  Extraocular movements were intact.  Sclerae and conjunctivae clear.  Oropharynx clear. NECK:  Without thyromegaly. BREASTS:  Not examined. LUNGS:  Clear. CARDIOVASCULAR SYSTEM:  Regular rhythm rate without murmurs or gallops. ABDOMEN:  Benign.  No mass, organomegaly, or tenderness. PELVIC:  Normal external genitalia.  Vaginal mucosa is clear.  Cervix unremarkable.  Uterus normal size, shape, and contour.  Adnexa free of masses or tenderness. EXTREMITIES:  Trace edema. NEUROLOGIC:  Grossly within normal limits.  IMPRESSION: 1. BRCA1 mutation variant. 2. Personal history of breast cancer with bilateral mastectomies and     implant placement. 3. History of melanoma. 4. Strong family history of breast cancer.  PLAN:  In view of the BRCA mutation, she is at high risk for ovarian cancer.  Because this has been recommended and she comes in today for laparoscopic-assisted vaginal hysterectomy with bilateral salpingo- oophorectomy.  Risks of surgery have been explained including the risk of infection.  Risk of hemorrhage that could require transfusion with the risk of AIDS or hepatitis.  Risk of injury to adjacent organs including bladder, bowel, ureters that could require further exploratory surgery.  There is also the risk of deep venous thrombosis and pulmonary embolus.  We will be doing cystoscopy after the procedure to document that the ureters will not damage.  The patient expressed understanding of indications and risks.     Darlyn Chamber, [name]  D:  08/06/2014  T:  08/06/2014  Job:  [id number]

## 2014-08-12 LAB — CBC
HCT: 39.6 % (ref 36.0–46.0)
Hemoglobin: 12.2 g/dL (ref 12.0–15.0)
MCH: 26.2 pg (ref 26.0–34.0)
MCHC: 30.8 g/dL (ref 30.0–36.0)
MCV: 85.2 fL (ref 78.0–100.0)
Platelets: 328 10*3/uL (ref 150–400)
RBC: 4.65 MIL/uL (ref 3.87–5.11)
RDW: 15.8 % — ABNORMAL HIGH (ref 11.5–15.5)
WBC: 6.1 10*3/uL (ref 4.0–10.5)

## 2014-08-12 LAB — HCG, SERUM, QUALITATIVE: Preg, Serum: NEGATIVE

## 2014-08-13 ENCOUNTER — Encounter (HOSPITAL_BASED_OUTPATIENT_CLINIC_OR_DEPARTMENT_OTHER): Payer: Self-pay

## 2014-08-13 ENCOUNTER — Inpatient Hospital Stay (HOSPITAL_BASED_OUTPATIENT_CLINIC_OR_DEPARTMENT_OTHER)
Admission: RE | Admit: 2014-08-13 | Discharge: 2014-08-15 | DRG: 743 | Disposition: A | Discharge status: Home / Self Care | Payer: 59 | Source: Ambulatory Visit | Attending: Obstetrics and Gynecology | Admitting: Obstetrics and Gynecology

## 2014-08-13 ENCOUNTER — Encounter (HOSPITAL_COMMUNITY)
Admission: RE | Disposition: A | Discharge status: Home / Self Care | Payer: Self-pay | Source: Ambulatory Visit | Attending: Obstetrics and Gynecology

## 2014-08-13 ENCOUNTER — Ambulatory Visit (HOSPITAL_BASED_OUTPATIENT_CLINIC_OR_DEPARTMENT_OTHER): Payer: 59 | Admitting: Anesthesiology

## 2014-08-13 DIAGNOSIS — Z885 Allergy status to narcotic agent status: Secondary | ICD-10-CM

## 2014-08-13 DIAGNOSIS — Z90722 Acquired absence of ovaries, bilateral: Secondary | ICD-10-CM

## 2014-08-13 DIAGNOSIS — Z1501 Genetic susceptibility to malignant neoplasm of breast: Secondary | ICD-10-CM

## 2014-08-13 DIAGNOSIS — Z9049 Acquired absence of other specified parts of digestive tract: Secondary | ICD-10-CM | POA: Diagnosis present

## 2014-08-13 DIAGNOSIS — Z803 Family history of malignant neoplasm of breast: Secondary | ICD-10-CM

## 2014-08-13 DIAGNOSIS — N800 Endometriosis of uterus: Principal | ICD-10-CM | POA: Diagnosis present

## 2014-08-13 DIAGNOSIS — Z9013 Acquired absence of bilateral breasts and nipples: Secondary | ICD-10-CM | POA: Diagnosis present

## 2014-08-13 DIAGNOSIS — D0512 Intraductal carcinoma in situ of left breast: Secondary | ICD-10-CM | POA: Diagnosis present

## 2014-08-13 DIAGNOSIS — Z9079 Acquired absence of other genital organ(s): Secondary | ICD-10-CM

## 2014-08-13 DIAGNOSIS — N920 Excessive and frequent menstruation with regular cycle: Secondary | ICD-10-CM | POA: Diagnosis present

## 2014-08-13 DIAGNOSIS — Z85820 Personal history of malignant melanoma of skin: Secondary | ICD-10-CM

## 2014-08-13 DIAGNOSIS — Z90710 Acquired absence of both cervix and uterus: Secondary | ICD-10-CM

## 2014-08-13 HISTORY — PX: LAPAROSCOPIC ASSISTED VAGINAL HYSTERECTOMY: SHX5398

## 2014-08-13 SURGERY — HYSTERECTOMY, VAGINAL, LAPAROSCOPY-ASSISTED
Anesthesia: General | Site: Abdomen

## 2014-08-13 MED ORDER — FENTANYL CITRATE 0.05 MG/ML IJ SOLN
INTRAMUSCULAR | Status: AC
  Filled 2014-08-13: qty 2

## 2014-08-13 MED ORDER — FENTANYL CITRATE 0.05 MG/ML IJ SOLN
INTRAMUSCULAR | Status: AC
  Filled 2014-08-13: qty 10

## 2014-08-13 MED ORDER — SODIUM CHLORIDE 0.9 % IR SOLN
Status: DC | PRN
Start: 2014-08-13 — End: 2014-08-13
  Administered 2014-08-13: 500 mL

## 2014-08-13 MED ORDER — PROPOFOL 10 MG/ML IV BOLUS
INTRAVENOUS | Status: DC | PRN
  Administered 2014-08-13: 200 mg via INTRAVENOUS
  Administered 2014-08-13: 30 mg via INTRAVENOUS

## 2014-08-13 MED ORDER — LIDOCAINE HCL (CARDIAC) 20 MG/ML IV SOLN
INTRAVENOUS | Status: DC | PRN
  Administered 2014-08-13: 100 mg via INTRAVENOUS

## 2014-08-13 MED ORDER — PROMETHAZINE HCL 25 MG/ML IJ SOLN
12.5000 mg | Freq: Four times a day (QID) | INTRAMUSCULAR | Status: DC | PRN
  Filled 2014-08-13: qty 1

## 2014-08-13 MED ORDER — ONDANSETRON HCL 4 MG/2ML IJ SOLN
4.0000 mg | Freq: Four times a day (QID) | INTRAMUSCULAR | Status: DC | PRN
  Administered 2014-08-13: 4 mg via INTRAVENOUS
  Filled 2014-08-13 (×2): qty 2

## 2014-08-13 MED ORDER — CEFAZOLIN SODIUM-DEXTROSE 2-3 GM-% IV SOLR
2.0000 g | INTRAVENOUS | Status: AC
  Administered 2014-08-13: 2 g via INTRAVENOUS
  Filled 2014-08-13: qty 50

## 2014-08-13 MED ORDER — DIPHENHYDRAMINE HCL 50 MG/ML IJ SOLN
12.5000 mg | Freq: Four times a day (QID) | INTRAMUSCULAR | Status: DC | PRN
  Filled 2014-08-13: qty 0.25

## 2014-08-13 MED ORDER — SODIUM CHLORIDE 0.9 % IJ SOLN
9.0000 mL | INTRAMUSCULAR | Status: DC | PRN
  Filled 2014-08-13: qty 9

## 2014-08-13 MED ORDER — HYDROMORPHONE 0.3 MG/ML IV SOLN
INTRAVENOUS | Status: DC
  Administered 2014-08-13: 13:00:00 via INTRAVENOUS
  Administered 2014-08-13: 3 mL via INTRAVENOUS
  Administered 2014-08-13: 1.69 mg via INTRAVENOUS
  Administered 2014-08-14: 0 mg via INTRAVENOUS
  Filled 2014-08-13 (×2): qty 25

## 2014-08-13 MED ORDER — MIDAZOLAM HCL 5 MG/5ML IJ SOLN
INTRAMUSCULAR | Status: DC | PRN
  Administered 2014-08-13: 2 mg via INTRAVENOUS

## 2014-08-13 MED ORDER — NALOXONE HCL 0.4 MG/ML IJ SOLN
0.4000 mg | INTRAMUSCULAR | Status: DC | PRN
  Filled 2014-08-13: qty 1

## 2014-08-13 MED ORDER — HEPARIN SOD (PORK) LOCK FLUSH 100 UNIT/ML IV SOLN
INTRAVENOUS | Status: DC | PRN
  Administered 2014-08-13: 500 [IU]

## 2014-08-13 MED ORDER — SUCCINYLCHOLINE CHLORIDE 20 MG/ML IJ SOLN
INTRAMUSCULAR | Status: DC | PRN
  Administered 2014-08-13: 100 mg via INTRAVENOUS

## 2014-08-13 MED ORDER — HYDROMORPHONE HCL 1 MG/ML IJ SOLN
INTRAMUSCULAR | Status: DC | PRN
  Administered 2014-08-13: 0.5 mg via INTRAVENOUS
  Administered 2014-08-13: .25 mg via INTRAVENOUS
  Administered 2014-08-13: 0.5 mg via INTRAVENOUS
  Administered 2014-08-13: .25 mg via INTRAVENOUS

## 2014-08-13 MED ORDER — HYDROMORPHONE HCL 2 MG PO TABS
2.0000 mg | ORAL_TABLET | ORAL | Status: DC | PRN
  Administered 2014-08-14 (×4): 2 mg via ORAL
  Filled 2014-08-13 (×5): qty 1

## 2014-08-13 MED ORDER — ROCURONIUM BROMIDE 100 MG/10ML IV SOLN
INTRAVENOUS | Status: DC | PRN
  Administered 2014-08-13: 10 mg via INTRAVENOUS
  Administered 2014-08-13: 30 mg via INTRAVENOUS
  Administered 2014-08-13 (×3): 10 mg via INTRAVENOUS

## 2014-08-13 MED ORDER — LIDOCAINE HCL 4 % MT SOLN
OROMUCOSAL | Status: DC | PRN
  Administered 2014-08-13: 3 mL via TOPICAL

## 2014-08-13 MED ORDER — MEPERIDINE HCL 25 MG/ML IJ SOLN
6.2500 mg | INTRAMUSCULAR | Status: DC | PRN
  Filled 2014-08-13: qty 1

## 2014-08-13 MED ORDER — LACTATED RINGERS IV SOLN
INTRAVENOUS | Status: DC
  Administered 2014-08-13: 12:00:00 via INTRAVENOUS
  Filled 2014-08-13: qty 1000

## 2014-08-13 MED ORDER — ONDANSETRON HCL 4 MG/2ML IJ SOLN
4.0000 mg | Freq: Four times a day (QID) | INTRAMUSCULAR | Status: DC | PRN
  Filled 2014-08-13: qty 2

## 2014-08-13 MED ORDER — HYDROMORPHONE HCL 1 MG/ML IJ SOLN
INTRAMUSCULAR | Status: AC
  Filled 2014-08-13: qty 2

## 2014-08-13 MED ORDER — MIDAZOLAM HCL 2 MG/2ML IJ SOLN
INTRAMUSCULAR | Status: AC
  Filled 2014-08-13: qty 2

## 2014-08-13 MED ORDER — LACTATED RINGERS IR SOLN
Status: DC | PRN
  Administered 2014-08-13: 3000 mL

## 2014-08-13 MED ORDER — KETOROLAC TROMETHAMINE 30 MG/ML IJ SOLN
INTRAMUSCULAR | Status: DC | PRN
  Administered 2014-08-13: 30 mg via INTRAVENOUS

## 2014-08-13 MED ORDER — GLYCOPYRROLATE 0.2 MG/ML IJ SOLN
INTRAMUSCULAR | Status: DC | PRN
  Administered 2014-08-13: 0.6 mg via INTRAVENOUS
  Administered 2014-08-13: 0.2 mg via INTRAVENOUS

## 2014-08-13 MED ORDER — CEFAZOLIN SODIUM-DEXTROSE 2-3 GM-% IV SOLR
INTRAVENOUS | Status: AC
  Filled 2014-08-13: qty 50

## 2014-08-13 MED ORDER — ACETAMINOPHEN 10 MG/ML IV SOLN
INTRAVENOUS | Status: DC | PRN
  Administered 2014-08-13: 1000 mg via INTRAVENOUS

## 2014-08-13 MED ORDER — FENTANYL CITRATE 0.05 MG/ML IJ SOLN
INTRAMUSCULAR | Status: DC | PRN
  Administered 2014-08-13 (×8): 50 ug via INTRAVENOUS
  Administered 2014-08-13: 100 ug via INTRAVENOUS

## 2014-08-13 MED ORDER — PROMETHAZINE HCL 25 MG/ML IJ SOLN
6.2500 mg | INTRAMUSCULAR | Status: DC | PRN
  Filled 2014-08-13: qty 1

## 2014-08-13 MED ORDER — DIPHENHYDRAMINE HCL 12.5 MG/5ML PO ELIX
12.5000 mg | ORAL_SOLUTION | Freq: Four times a day (QID) | ORAL | Status: DC | PRN
  Filled 2014-08-13: qty 5

## 2014-08-13 MED ORDER — METOCLOPRAMIDE HCL 5 MG/ML IJ SOLN
INTRAMUSCULAR | Status: DC | PRN
  Administered 2014-08-13: 10 mg via INTRAVENOUS

## 2014-08-13 MED ORDER — ACETAMINOPHEN 325 MG PO TABS
650.0000 mg | ORAL_TABLET | ORAL | Status: DC | PRN
  Administered 2014-08-13 – 2014-08-14 (×2): 650 mg via ORAL
  Filled 2014-08-13 (×4): qty 2

## 2014-08-13 MED ORDER — NEOSTIGMINE METHYLSULFATE 10 MG/10ML IV SOLN
INTRAVENOUS | Status: DC | PRN
  Administered 2014-08-13: 5 mg via INTRAVENOUS

## 2014-08-13 MED ORDER — LACTATED RINGERS IV SOLN
INTRAVENOUS | Status: DC
  Administered 2014-08-13: 125 mL/h via INTRAVENOUS
  Administered 2014-08-14: 11:00:00 via INTRAVENOUS
  Filled 2014-08-13: qty 1000

## 2014-08-13 MED ORDER — ONDANSETRON HCL 4 MG/2ML IJ SOLN
INTRAMUSCULAR | Status: DC | PRN
  Administered 2014-08-13: 4 mg via INTRAVENOUS

## 2014-08-13 MED ORDER — FENTANYL CITRATE 0.05 MG/ML IJ SOLN
25.0000 ug | INTRAMUSCULAR | Status: DC | PRN
  Administered 2014-08-13: 50 ug via INTRAVENOUS
  Filled 2014-08-13: qty 1

## 2014-08-13 MED ORDER — DEXAMETHASONE SODIUM PHOSPHATE 4 MG/ML IJ SOLN
INTRAMUSCULAR | Status: DC | PRN
  Administered 2014-08-13: 10 mg via INTRAVENOUS

## 2014-08-13 MED ORDER — MENTHOL 3 MG MT LOZG
1.0000 | LOZENGE | OROMUCOSAL | Status: DC | PRN
  Filled 2014-08-13: qty 9

## 2014-08-13 MED ORDER — BUPIVACAINE HCL (PF) 0.25 % IJ SOLN
INTRAMUSCULAR | Status: DC | PRN
  Administered 2014-08-13: 7 mL

## 2014-08-13 MED ORDER — LACTATED RINGERS IV SOLN
INTRAVENOUS | Status: DC
  Administered 2014-08-13 (×3): via INTRAVENOUS
  Filled 2014-08-13: qty 1000

## 2014-08-13 MED ORDER — ONDANSETRON HCL 4 MG PO TABS
4.0000 mg | ORAL_TABLET | Freq: Four times a day (QID) | ORAL | Status: DC | PRN
  Filled 2014-08-13: qty 1

## 2014-08-13 SURGICAL SUPPLY — 73 items
APPLICATOR COTTON TIP 6IN STRL (MISCELLANEOUS) ×4 IMPLANT
BAG URINE DRAINAGE (UROLOGICAL SUPPLIES) ×4 IMPLANT
BLADE SURG 10 STRL SS (BLADE) ×7 IMPLANT
BLADE SURG 11 STRL SS (BLADE) ×4 IMPLANT
CANISTER SUCTION 2500CC (MISCELLANEOUS) ×8 IMPLANT
CATH FOLEY 2WAY SLVR  5CC 14FR (CATHETERS) ×1
CATH FOLEY 2WAY SLVR 5CC 14FR (CATHETERS) ×3 IMPLANT
CATH ROBINSON RED A/P 16FR (CATHETERS) ×4 IMPLANT
CLEANER CAUTERY TIP 5X5 PAD (MISCELLANEOUS) ×2 IMPLANT
COVER MAYO STAND STRL (DRAPES) ×11 IMPLANT
COVER TABLE BACK 60X90 (DRAPES) ×8 IMPLANT
DRAPE CAMERA CLOSED 9X96 (DRAPES) ×4 IMPLANT
DRAPE LG THREE QUARTER DISP (DRAPES) ×4 IMPLANT
DRAPE UNDERBUTTOCKS STRL (DRAPE) ×4 IMPLANT
DRAPE WARM FLUID 44X44 (DRAPE) ×3 IMPLANT
DRSG OPSITE POSTOP 3X4 (GAUZE/BANDAGES/DRESSINGS) ×3 IMPLANT
ELECT BLADE 6.5 .24CM SHAFT (ELECTRODE) ×3 IMPLANT
ELECT REM PT RETURN 9FT ADLT (ELECTROSURGICAL) ×4
ELECTRODE REM PT RTRN 9FT ADLT (ELECTROSURGICAL) ×3 IMPLANT
GLOVE BIO SURGEON STRL SZ7 (GLOVE) ×16 IMPLANT
GLOVE BIOGEL PI IND STRL 6.5 (GLOVE) ×5 IMPLANT
GLOVE BIOGEL PI IND STRL 7.5 (GLOVE) ×4 IMPLANT
GLOVE BIOGEL PI INDICATOR 6.5 (GLOVE) ×2
GLOVE BIOGEL PI INDICATOR 7.5 (GLOVE) ×2
GLOVE ECLIPSE 6.5 STRL STRAW (GLOVE) ×3 IMPLANT
GLOVE ECLIPSE 7.0 STRL STRAW (GLOVE) ×3 IMPLANT
GLOVE SURG SS PI 7.5 STRL IVOR (GLOVE) ×3 IMPLANT
GOWN STRL REUS W/ TWL XL LVL3 (GOWN DISPOSABLE) ×2 IMPLANT
GOWN STRL REUS W/TWL XL LVL3 (GOWN DISPOSABLE) ×13 IMPLANT
HOLDER FOLEY CATH W/STRAP (MISCELLANEOUS) ×4 IMPLANT
LIQUID BAND (GAUZE/BANDAGES/DRESSINGS) ×7 IMPLANT
NDL INSUFFLATION 14GA 120MM (NEEDLE) IMPLANT
NEEDLE HYPO 22GX1.5 SAFETY (NEEDLE) ×4 IMPLANT
NEEDLE INSUFFLATION 14GA 120MM (NEEDLE) ×4 IMPLANT
NS IRRIG 500ML POUR BTL (IV SOLUTION) ×10 IMPLANT
PACK BASIN DAY SURGERY FS (CUSTOM PROCEDURE TRAY) ×4 IMPLANT
PAD CLEANER CAUTERY TIP 5X5 (MISCELLANEOUS) ×1
PAD OB MATERNITY 4.3X12.25 (PERSONAL CARE ITEMS) ×4 IMPLANT
PAD PREP 24X48 CUFFED NSTRL (MISCELLANEOUS) ×4 IMPLANT
PENCIL BUTTON HOLSTER BLD 10FT (ELECTRODE) ×4 IMPLANT
SCISSORS LAP 5X35 DISP (ENDOMECHANICALS) IMPLANT
SEALER TISSUE G2 CVD JAW 45CM (ENDOMECHANICALS) ×4 IMPLANT
SET IRRIG TUBING LAPAROSCOPIC (IRRIGATION / IRRIGATOR) ×1 IMPLANT
SET IRRIG Y TYPE TUR BLADDER L (SET/KITS/TRAYS/PACK) ×4 IMPLANT
SHEET LAVH (DRAPES) ×4 IMPLANT
SOLUTION ANTI FOG 6CC (MISCELLANEOUS) ×4 IMPLANT
SPONGE GAUZE 2X2 8PLY STRL LF (GAUZE/BANDAGES/DRESSINGS) ×6 IMPLANT
SPONGE LAP 18X18 X RAY DECT (DISPOSABLE) ×9 IMPLANT
SPONGE LAP 4X18 X RAY DECT (DISPOSABLE) ×4 IMPLANT
STRIP CLOSURE SKIN 1/4X4 (GAUZE/BANDAGES/DRESSINGS) IMPLANT
SUT MNCRL AB 4-0 PS2 18 (SUTURE) ×9 IMPLANT
SUT MON AB 2-0 CT1 36 (SUTURE) ×4 IMPLANT
SUT PDS AB 0 CT1 36 (SUTURE) ×3 IMPLANT
SUT PLAIN 2 0 XLH (SUTURE) ×3 IMPLANT
SUT VIC AB 0 CT1 18XCR BRD8 (SUTURE) ×8 IMPLANT
SUT VIC AB 0 CT1 36 (SUTURE) ×8 IMPLANT
SUT VIC AB 0 CT1 8-18 (SUTURE) ×12
SUT VICRYL 0 UR6 27IN ABS (SUTURE) ×3 IMPLANT
SUT VICRYL 1 TIES 12X18 (SUTURE) ×4 IMPLANT
SUT VICRYL 4-0 PS2 18IN ABS (SUTURE) ×4 IMPLANT
SYR BULB IRRIGATION 50ML (SYRINGE) ×4 IMPLANT
SYR CONTROL 10ML LL (SYRINGE) ×4 IMPLANT
SYRINGE 10CC LL (SYRINGE) ×4 IMPLANT
TOWEL OR 17X24 6PK STRL BLUE (TOWEL DISPOSABLE) ×8 IMPLANT
TRAY DSU PREP LF (CUSTOM PROCEDURE TRAY) ×4 IMPLANT
TROCAR BALLN 12MMX100 BLUNT (TROCAR) ×3 IMPLANT
TROCAR OPTI TIP 5M 100M (ENDOMECHANICALS) ×4 IMPLANT
TUBE CONNECTING 12X1/4 (SUCTIONS) ×8 IMPLANT
TUBING INSUFFLATION 10FT LAP (TUBING) ×4 IMPLANT
WARMER LAPAROSCOPE (MISCELLANEOUS) ×4 IMPLANT
WATER STERILE IRR 3000ML UROMA (IV SOLUTION) ×4 IMPLANT
WATER STERILE IRR 500ML POUR (IV SOLUTION) ×4 IMPLANT
YANKAUER SUCT BULB TIP NO VENT (SUCTIONS) ×4 IMPLANT

## 2014-08-13 NOTE — Anesthesia Preprocedure Evaluation (Addendum)
Anesthesia Evaluation  Patient identified by MRN, date of birth, ID band Patient awake    Reviewed: Allergy & Precautions, H&P , NPO status , Patient's Chart, lab work & pertinent test results  History of Anesthesia Complications (+) PONV  Airway Mallampati: I   Neck ROM: Full    Dental  (+) Teeth Intact   Pulmonary former smoker,  breath sounds clear to auscultation        Cardiovascular negative cardio ROS  Rhythm:Regular Rate:Normal     Neuro/Psych negative neurological ROS     GI/Hepatic negative GI ROS, Neg liver ROS,   Endo/Other  diabetes  Renal/GU negative Renal ROS     Musculoskeletal   Abdominal (+) + obese,   Peds  Hematology   Anesthesia Other Findings   Reproductive/Obstetrics                             Anesthesia Physical  Anesthesia Plan  ASA: II  Anesthesia Plan: General   Post-op Pain Management:    Induction: Intravenous  Airway Management Planned: Oral ETT  Additional Equipment:   Intra-op Plan:   Post-operative Plan: Extubation in OR  Informed Consent: I have reviewed the patients History and Physical, chart, labs and discussed the procedure including the risks, benefits and alternatives for the proposed anesthesia with the patient or authorized representative who has indicated his/her understanding and acceptance.   Dental advisory given  Plan Discussed with: CRNA and Surgeon  Anesthesia Plan Comments:         Anesthesia Quick Evaluation

## 2014-08-13 NOTE — Anesthesia Postprocedure Evaluation (Signed)
  Anesthesia Post-op Note  Patient: Krystal [name]  Procedure(s) Performed: Procedure(s) (LRB): OPEN LAPAROSCOPY, EXPLORATORY LAPAROTOMY, TOTAL ABDOMINAL HYSTERECTOMY WITH BILATERAL SALPINGECTOMY AND BILATERAL OOPHORECTOMY (N/A)  Patient Location: PACU  Anesthesia Type: General  Level of Consciousness: awake and alert   Airway and Oxygen Therapy: Patient Spontanous Breathing  Post-op Pain: mild  Post-op Assessment: Post-op Vital signs reviewed, Patient's Cardiovascular Status Stable, Respiratory Function Stable, Patent Airway and No signs of Nausea or vomiting  Last Vitals:  Filed Vitals:   08/13/14 1303  BP: 119/67  Pulse: 101  Temp: 37.1 C  Resp: 20    Post-op Vital Signs: stable   Complications: No apparent anesthesia complications

## 2014-08-13 NOTE — Transfer of Care (Signed)
Immediate Anesthesia Transfer of Care Note  Patient: Krystal [name]  Procedure(s) Performed: Procedure(s) (LRB): OPEN LAPAROSCOPY, EXPLORATORY LAPAROTOMY, TOTAL ABDOMINAL HYSTERECTOMY WITH BILATERAL SALPINGECTOMY AND BILATERAL OOPHORECTOMY (N/A)  Patient Location: PACU  Anesthesia Type: General  Level of Consciousness: drowsy, oral airway out  Airway & Oxygen Therapy: Patient Spontanous Breathing and Patient connected to face mask oxygen  Post-op Assessment: Report given to PACU RN and Post -op Vital signs reviewed and stable  Post vital signs: Reviewed and stable  Complications: No apparent anesthesia complications

## 2014-08-13 NOTE — Op Note (Signed)
Patient name  Krystal [name], [employer] DICTATION#  [id number] CSN# [id number]  Darlyn Chamber, MD 08/13/2014 10:00 AM

## 2014-08-13 NOTE — Op Note (Signed)
NAMEMarland Kitchen  Krystal [name], Krystal [name] NO.:  [id number]  MEDICAL RECORD NO.:  [id number]  LOCATION:  3151                         FACILITY:  [facility]:  Krystal [name], M.D.   DATE OF BIRTH:  16-Jan-1966  DATE OF PROCEDURE:  08/13/2014 DATE OF DISCHARGE:                              OPERATIVE REPORT   PREOPERATIVE DIAGNOSIS:  The patient with BRCA1 gene mutation leading to an increased risk of ovarian cancer.  The patient is also suffering from menorrhagia secondary to adenomyosis.  POSTOPERATIVE DIAGNOSIS:  The patient with BRCA1 gene mutation leading to an increased risk of ovarian cancer.  The patient is also suffering from menorrhagia secondary to adenomyosis.  PROCEDURE:  Open laparoscopy.  Subsequent exploratory laparotomy, total abdominal hysterectomy, and bilateral salpingo-oophorectomy.  SURGEON:  Krystal [name], M.D.  ASSISTANT:  Linda Hedges, DO  ANESTHESIA:  General endotracheal.  ESTIMATED BLOOD LOSS:  400 to 500 mL.  PACKS:  None.  DRAINS:  Included urethral Foley.  INTRAOPERATIVE BLOOD PLACED:  None.  COMPLICATIONS:  None.  INDICATION:  As dictated in History and Physical.  PROCEDURE IN DETAIL:  The patient was taken to OR, placed in supine position.  After satisfactory level of general endotracheal anesthesia obtained, the patient was placed in the dorsal lithotomy position using the Allen stirrups.  At this point in time, the abdomen, perineum, and vagina were prepped out with Betadine.  Bladder was emptied by in and out catheterization.  Hulka tenaculum was put in place and secured.  The patient was then draped in sterile field.  Subumbilical incision made with a knife, incision was extended through the subcutaneous tissue. The fascia was identified and entered sharply.  Peritoneum was entered with blunt finger pressure.  Open laparoscopic trocar was put in place and secured.  The abdomen was deflated with carbon dioxide, laparoscope was  introduced.  There was no injury to adjacent organs.  A 5-mm trocar was put in place under direct visualization.  Uterus had marked adhesions to the bladder area, and the left ovary was densely adherent to the pelvic sidewall.  Therefore, it did made difficult to be completely sure we had removed all the ovarian tissue done laparoscopically.  Decision, at this point, was to proceed with exploratory surgery.  Of note, we did obtain pelvic washings.  At this point in time, laparoscope was removed, abdomen deflated with carbon dioxide, all trocars were removed.  The patient's legs were repositioned.  The Hulka tenaculum was removed.  A Foley was placed to straight drain.  A low-transverse skin incision was made with a knife and carried through the subcutaneous tissue.  Fascia was identified and entered sharply, incision fashioned laterally.  Fascia taken off the muscle superiorly and inferiorly.  Some oozing was noted to the right side, brought under control with Bovie and sutures of 0 Vicryl. Peritoneum was entered sharply.  Incision of perineum extended both superiorly and inferiorly.  An O'Connor-O'Sullivan retractor was put in place and bowel contents were packed superiorly out of the pelvis. Utero-ovarian vasculatures were clamped with 2 long Kellys, and the uterus was elevated.  We first went to the left side.  We dissected the  left ovary free from its peritoneal attachments making sure no ovarian tissue was left behind.  The tube was also adhered and dissected free. At this point in time, we identified the left round ligament, it was clamped, cut, and suture ligated.  We developed a left retroperitoneal space.  Identified the left ureter.  We identified the ovarian vasculature above the ureter, it was clamped, cut, and doubly ligated first with a free tie of 0 Vicryl, then a suture ligature of 0 Vicryl. The uterine vessels were skeletonized and the bladder flap was  partially developed.  The ovarian vasculature on the left side were clamped, cut, and suture ligated with 0 Vicryl.  We then went to the right side.  The right round ligament was clamped, cut, and suture ligated with 0 Vicryl. Right retroperitoneal space was then developed.  Right ureter was identified, and the ovarian vasculature was isolated above the ureter, it was clamped, cut, and ligated with a free tie of 0 Vicryl and a suture ligature of 0 Vicryl.  The bladder flap was further developed. The right uterine vessels were clamped, cut, and suture ligated with 0 Vicryl.  We completed, at this point in time, using sharp dissection, relieved the adhesions from the bladder to the lower uterine wall and dissected it down completely.  Next, using a clamp, cut, and tie technique with suture ligature of 0 Vicryl, the parametrium serially separated from the sides of uterus.  Vaginal angles were identified, clamped and cut, and uterus passed off the operative field.  Both tubes and ovaries held.  Vaginal angles were suture ligated with 0 Vicryl. Intervening vaginal mucosa was closed with interrupted sutures of 0 Vicryl.  We went to the right side.  First, there was some oozing from the edge of the peritoneum, this was brought under control with a right angle and a free tie of 0 Vicryl.  We then went to the left side.  We had good hemostasis.  Again we identified the ureter below the process on each side.  There was some oozing from the vaginal cuff, brought under control with a figure-of-eight of 0 Vicryl and the bipolar.  We then went to the bladder base, there was some oozing there and that was brought under control with the bipolar.  We irrigated the pelvis and had good hemostasis and clear urine output.  At this point in time, the O'Connor-O'Sullivan retractor was removed.  All packs were removed.  The appendix was visualized and noted to be normal.  Peritoneum closed with a running suture  of 2-0 Vicryl.  Some oozing was noted below the fascia on the right side.  This was brought under control with about 3 figure- of-eights of 0 Vicryl.  Some bleeding was noted from the rectus muscle, that also brought under control with a suture ligature of 0 Vicryl.  At this point, we had good hemostasis.  The fascia was closed in a running suture of 0 PDS.  Deep subcutaneous was closed with interrupted sutures of 2-0 plain catgut.  Skin was closed in running subcuticular 4-0 Monocryl.  As we got to the right side, some active bleeding came from the edge of the incision.  We had to take down the Monocryl subcutaneous tissue, we identified some active bleeding at the corner just below the skin, brought under control with the bipolar, now put in a suture of 0 Vicryl.  Then, we reapproximated the skin again with a running subcuticular 4-0 Monocryl and Dermabond.  We then went to the subumbilical incision.  The fascia was identified, it was closed with a figure-of-eight of 0 Vicryl.  Skin was closed with interrupted suture ligatures of 3-0 Vicryl.  At this point in time, we had good hemostasis. Urine output remained clear.  Sponge, instrument, and needle count was correct by circulating nurse multiple times.  The patient tolerated the procedure well.  Once extubated, she was transferred to recovery room in good condition dictation.    Krystal [name], M.D.    JSM/MEDQ  D:  08/13/2014  T:  08/13/2014  Job:  [id number]

## 2014-08-13 NOTE — Progress Notes (Signed)
Unable to attempt ambulation this shift due to sedation and, then, nausea. Advised night shift of amb orders.

## 2014-08-13 NOTE — Anesthesia Procedure Notes (Signed)
Procedure Name: Intubation Date/Time: 08/13/2014 7:35 AM Performed by: Mechele Claude Pre-anesthesia Checklist: Patient identified, Emergency Drugs available, Suction available and Patient being monitored Patient Re-evaluated:Patient Re-evaluated prior to inductionOxygen Delivery Method: Circle System Utilized Preoxygenation: Pre-oxygenation with 100% oxygen Intubation Type: IV induction Ventilation: Mask ventilation without difficulty Laryngoscope Size: Mac and 3 Grade View: Grade II Tube type: Oral Tube size: 7.0 mm Number of attempts: 1 Airway Equipment and Method: stylet and LTA kit utilized Placement Confirmation: ETT inserted through vocal cords under direct vision,  positive ETCO2 and breath sounds checked- equal and bilateral Secured at: 21 cm Tube secured with: Tape Dental Injury: Teeth and Oropharynx as per pre-operative assessment

## 2014-08-13 NOTE — Progress Notes (Signed)
Pt c/o she was nauseous, requested med. Last dose was around 1700, so it was not time. Notified MD, obtained order for phenergan. Went to administer it to pt, but she was asleep, so I held the med.

## 2014-08-13 NOTE — Progress Notes (Signed)
Patient ID: Krystal [name], female   DOB: [birth year], 49 y.o.   MRN: [id number] AF VSS ABD SOFT DRESSING DRY GOOD UP CONTINUE POST OP CARE

## 2014-08-14 ENCOUNTER — Encounter (HOSPITAL_BASED_OUTPATIENT_CLINIC_OR_DEPARTMENT_OTHER): Payer: Self-pay | Admitting: Obstetrics and Gynecology

## 2014-08-14 DIAGNOSIS — Z85820 Personal history of malignant melanoma of skin: Secondary | ICD-10-CM | POA: Diagnosis not present

## 2014-08-14 DIAGNOSIS — N920 Excessive and frequent menstruation with regular cycle: Secondary | ICD-10-CM | POA: Diagnosis present

## 2014-08-14 DIAGNOSIS — Z9049 Acquired absence of other specified parts of digestive tract: Secondary | ICD-10-CM | POA: Diagnosis present

## 2014-08-14 DIAGNOSIS — Z9013 Acquired absence of bilateral breasts and nipples: Secondary | ICD-10-CM | POA: Diagnosis present

## 2014-08-14 DIAGNOSIS — Z803 Family history of malignant neoplasm of breast: Secondary | ICD-10-CM | POA: Diagnosis not present

## 2014-08-14 DIAGNOSIS — N800 Endometriosis of uterus: Secondary | ICD-10-CM | POA: Diagnosis present

## 2014-08-14 DIAGNOSIS — D0512 Intraductal carcinoma in situ of left breast: Secondary | ICD-10-CM | POA: Diagnosis present

## 2014-08-14 DIAGNOSIS — Z885 Allergy status to narcotic agent status: Secondary | ICD-10-CM | POA: Diagnosis not present

## 2014-08-14 DIAGNOSIS — Z1501 Genetic susceptibility to malignant neoplasm of breast: Secondary | ICD-10-CM | POA: Diagnosis present

## 2014-08-14 LAB — CBC
HCT: 27.7 % — ABNORMAL LOW (ref 36.0–46.0)
Hemoglobin: 8.9 g/dL — ABNORMAL LOW (ref 12.0–15.0)
MCH: 27.5 pg (ref 26.0–34.0)
MCHC: 32.1 g/dL (ref 30.0–36.0)
MCV: 85.5 fL (ref 78.0–100.0)
Platelets: 235 10*3/uL (ref 150–400)
RBC: 3.24 MIL/uL — ABNORMAL LOW (ref 3.87–5.11)
RDW: 15.9 % — ABNORMAL HIGH (ref 11.5–15.5)
WBC: 9 10*3/uL (ref 4.0–10.5)

## 2014-08-14 MED ORDER — IBUPROFEN 200 MG PO TABS
600.0000 mg | ORAL_TABLET | Freq: Four times a day (QID) | ORAL | Status: DC | PRN
Start: 2014-08-14 — End: 2014-08-15
  Administered 2014-08-14 – 2014-08-15 (×3): 600 mg via ORAL
  Filled 2014-08-14 (×3): qty 3

## 2014-08-14 NOTE — Progress Notes (Signed)
Patient ID: Krystal [name], female   DOB: 12-01-1965, 49 y.o.   MRN: [id number] PAIN ON THE RIGHT SIDE OF THE INCISION AF VSS PASSING FLATUS AND VOIDING POOR PAIN CONTROL DOES NOT WANT PCA WILL INCREASE PO DILAUDID AND ADD MOTRIN D/C IN AM

## 2014-08-14 NOTE — Progress Notes (Signed)
Nutrition Brief Note  Patient identified on the Malnutrition Screening Tool (MST) Report  Pt reports improved appetite. Pt is eating much better and tolerating her full liquids. Pt with no recent weight loss.  Wt Readings from Last 15 Encounters:  08/13/14 238 lb (107.956 kg)  06/23/14 232 lb 1.6 oz (105.28 kg)  05/26/14 232 lb 14.4 oz (105.643 kg)  05/15/14 225 lb 2 oz (102.116 kg)  05/12/14 228 lb 6.4 oz (103.602 kg)  04/29/14 224 lb 1.6 oz (101.651 kg)  04/17/14 234 lb 2.1 oz (106.2 kg)  03/11/14 231 lb (104.781 kg)  08/21/11 210 lb (95.255 kg)    Body mass index is 40.83 kg/(m^2). Patient meets criteria for morbid obesity based on current BMI.   Current diet order is full liquid, patient is consuming approximately 100% of meals at this time. Labs and medications reviewed.   No nutrition interventions warranted at this time. If nutrition issues arise, please consult RD.   Clayton Bibles, MS, RD, LDN Pager: [phone] After Hours Pager: [phone]

## 2014-08-14 NOTE — Progress Notes (Signed)
UR completed 

## 2014-08-14 NOTE — Progress Notes (Signed)
1 Day Post-Op Procedure(s) (LRB): OPEN LAPAROSCOPY, EXPLORATORY LAPAROTOMY, TOTAL ABDOMINAL HYSTERECTOMY WITH BILATERAL SALPINGECTOMY AND BILATERAL OOPHORECTOMY (N/A)  Subjective: Patient reports nausea and incisional pain.    Objective: I have reviewed patient's vital signs and labs.  General: alert GI: soft, non-tender; bowel sounds normal; no masses,  no organomegaly and incision: clean Vaginal Bleeding: minimal  Assessment: s/p Procedure(s): OPEN LAPAROSCOPY, EXPLORATORY LAPAROTOMY, TOTAL ABDOMINAL HYSTERECTOMY WITH BILATERAL SALPINGECTOMY AND BILATERAL OOPHORECTOMY (N/A): stable  Plan: Advance diet Discontinue IV fluids  LOS: 1 day    Courtany Mcmurphy S 08/14/2014, 8:16 AM

## 2014-08-15 MED ORDER — HYDROMORPHONE HCL 2 MG PO TABS: 2.0000 mg | ORAL_TABLET | ORAL | Status: DC | PRN

## 2014-08-15 NOTE — Progress Notes (Signed)
2 Days Post-Op Procedure(s) (LRB): OPEN LAPAROSCOPY, EXPLORATORY LAPAROTOMY, TOTAL ABDOMINAL HYSTERECTOMY WITH BILATERAL SALPINGECTOMY AND BILATERAL OOPHORECTOMY (N/A)  Subjective: Patient reports tolerating PO, + flatus and no problems voiding.    Objective: I have reviewed patient's vital signs.  General: alert GI: soft, non-tender; bowel sounds normal; no masses,  no organomegaly and incision: clean Vaginal Bleeding: none  Assessment: s/p Procedure(s): OPEN LAPAROSCOPY, EXPLORATORY LAPAROTOMY, TOTAL ABDOMINAL HYSTERECTOMY WITH BILATERAL SALPINGECTOMY AND BILATERAL OOPHORECTOMY (N/A): stable  Plan: Discharge home  LOS: 2 days    Anvi Mangal S 08/15/2014, 8:13 AM

## 2014-08-15 NOTE — Discharge Summary (Signed)
NAMEMarland Kitchen  REVIA, Krystal [name] NO.:  [id number]  MEDICAL RECORD NO.:  [id number]  LOCATION:  2409                         FACILITY:  [facility]  PHYSICIAN:  Darlyn Chamber, M.D.   DATE OF BIRTH:  1966-03-29  DATE OF ADMISSION:  08/13/2014 DATE OF DISCHARGE:                              DISCHARGE SUMMARY   PREOPERATIVE DIAGNOSIS:  The patient had [name] BRCA1 gene mutation which put her in increased risk of ovarian cancer.  POSTOPERATIVE DIAGNOSIS:  The patient had [name] BRCA1 gene mutation which put her in increased risk of ovarian cancer.  OPERATIVE PROCEDURE:  Open laparoscopy.  Subsequent exploratory laparotomy, total abdominal hysterectomy with bilateral salpingo- oophorectomy.  For complete history and physical, please see dictated note.  COURSE IN THE HOSPITAL:  Patient underwent above-noted surgery.  Postop hemoglobin was 8.9.  On her first postoperative day, she was having significant pain and discomfort.  We adjusted her pain medication.  She did much better.  On her second postoperative day, she was afebrile with stable vital signs.  Abdomen was soft.  Bowel sounds were active.  She was passing flatus, was having no trouble urinating.  All incisions were intact.  She is really having no vaginal bleeding or signs of deep venous thrombosis.  She will be discharged home at this time.  In terms of complications, none were encountered during her stay in the hospital.  Patient was discharged home in stable condition.  DISPOSITION:  The patient is to avoid heavy lifting, vaginal entrance, or driving [name].  She is to call with signs of infection, nausea, vomiting, excessive vaginal bleeding, or increasing abdominal pelvic pain.  Also instructed on signs and symptoms of deep venous thrombosis and pulmonary embolus.  The patient will call [name], [name]  D:   08/15/2014  T:  08/15/2014  Job:  [id number]

## 2014-08-15 NOTE — Discharge Summary (Signed)
Reviewed discharge instructions with pt including medications, follow-up appointments, and incision care.  Pt verbalized understanding of all instructions without questions.  Pt being d/c to home.

## 2014-08-15 NOTE — Discharge Summary (Signed)
  Patient name  [place], Shimada DICTATION#  [id number] CSN# [id number]  Darlyn Chamber, MD 08/15/2014 8:16 AM

## 2014-08-31 ENCOUNTER — Telehealth: Payer: Self-pay | Admitting: Hematology and Oncology

## 2014-08-31 NOTE — Telephone Encounter (Signed)
, °

## 2014-09-01 ENCOUNTER — Other Ambulatory Visit: Payer: No Typology Code available for payment source

## 2014-09-01 ENCOUNTER — Other Ambulatory Visit (HOSPITAL_BASED_OUTPATIENT_CLINIC_OR_DEPARTMENT_OTHER): Payer: 59

## 2014-09-01 ENCOUNTER — Ambulatory Visit (HOSPITAL_BASED_OUTPATIENT_CLINIC_OR_DEPARTMENT_OTHER): Payer: 59 | Admitting: Hematology and Oncology

## 2014-09-01 ENCOUNTER — Ambulatory Visit: Payer: No Typology Code available for payment source | Admitting: Hematology and Oncology

## 2014-09-01 ENCOUNTER — Telehealth: Payer: Self-pay | Admitting: Hematology and Oncology

## 2014-09-01 VITALS — BP 116/73 | HR 91 | Temp 98.3°F | Resp 18 | Ht 64.0 in | Wt 238.5 lb

## 2014-09-01 DIAGNOSIS — C50812 Malignant neoplasm of overlapping sites of left female breast: Secondary | ICD-10-CM

## 2014-09-01 DIAGNOSIS — C50112 Malignant neoplasm of central portion of left female breast: Secondary | ICD-10-CM

## 2014-09-01 DIAGNOSIS — Z85820 Personal history of malignant melanoma of skin: Secondary | ICD-10-CM

## 2014-09-01 LAB — CBC WITH DIFFERENTIAL/PLATELET
BASO%: 0.6 % (ref 0.0–2.0)
Basophils Absolute: 0 10*3/uL (ref 0.0–0.1)
EOS%: 2.6 % (ref 0.0–7.0)
Eosinophils Absolute: 0.2 10*3/uL (ref 0.0–0.5)
HCT: 36.1 % (ref 34.8–46.6)
HGB: 11.3 g/dL — ABNORMAL LOW (ref 11.6–15.9)
LYMPH%: 23.6 % (ref 14.0–49.7)
MCH: 26.3 pg (ref 25.1–34.0)
MCHC: 31.3 g/dL — ABNORMAL LOW (ref 31.5–36.0)
MCV: 84.1 fL (ref 79.5–101.0)
MONO#: 0.4 10*3/uL (ref 0.1–0.9)
MONO%: 6.5 % (ref 0.0–14.0)
NEUT#: 4.3 10*3/uL (ref 1.5–6.5)
NEUT%: 66.7 % (ref 38.4–76.8)
Platelets: 411 10*3/uL — ABNORMAL HIGH (ref 145–400)
RBC: 4.29 10*6/uL (ref 3.70–5.45)
RDW: 15.4 % — ABNORMAL HIGH (ref 11.2–14.5)
WBC: 6.5 10*3/uL (ref 3.9–10.3)
lymph#: 1.5 10*3/uL (ref 0.9–3.3)

## 2014-09-01 LAB — COMPREHENSIVE METABOLIC PANEL (CC13)
ALT: 14 U/L (ref 0–55)
AST: 14 U/L (ref 5–34)
Albumin: 3.8 g/dL (ref 3.5–5.0)
Alkaline Phosphatase: 82 U/L (ref 40–150)
Anion Gap: 10 mEq/L (ref 3–11)
BUN: 11 mg/dL (ref 7.0–26.0)
CO2: 28 mEq/L (ref 22–29)
Calcium: 8.9 mg/dL (ref 8.4–10.4)
Chloride: 104 mEq/L (ref 98–109)
Creatinine: 0.8 mg/dL (ref 0.6–1.1)
EGFR: >90 mL/min/{1.73_m2} (ref >90)
Glucose: 115 mg/dl (ref 70–140)
Potassium: 4.3 mEq/L (ref 3.5–5.1)
Sodium: 142 mEq/L (ref 136–145)
Total Bilirubin: 0.48 mg/dL (ref 0.20–1.20)
Total Protein: 7.6 g/dL (ref 6.4–8.3)

## 2014-09-01 MED ORDER — HYDROXYZINE HCL 10 MG PO TABS: 10.0000 mg | ORAL_TABLET | Freq: Every evening | ORAL | Status: DC | PRN

## 2014-09-01 MED ORDER — ANASTROZOLE 1 MG PO TABS: 1.0000 mg | ORAL_TABLET | Freq: Every day | ORAL | Status: DC

## 2014-09-01 NOTE — Progress Notes (Signed)
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 N0 M0 stage IA: Oncotype DX 13 (ROR 9%)   04/16/2014 Surgery Left upper back melanoma resected no residual melanoma identified on re-resection margins negative   05/26/2014 -  Anti-estrogen oral therapy Tamoxifen 20 mg daily with a plan to switch her to aromatase inhibitors once she gets oophorectomy   08/13/2014 Surgery oophorectomy with total abdominal hysterectomy    CHIEF COMPLIANT: Follow-up after recent oophorectomy and TAH  INTERVAL HISTORY: Krystal [name] is a 49 year old with above-mentioned history of left breast cancer who underwent bilateral mastectomies and took tamoxifen briefly but stopped it because of skin itching. It is unclear if the itching is related to tamoxifen. In spite of stopping it her symptoms did not improve. She continues to have itching sensation especially worse at night. She has seen a dermatologist were done skin biopsy. Multiple creams and ointments have not helped her. Today she continues to have itching sensation and would like to see allergy specialist. She had a hysterectomy and salpingectomy oophorectomy and is still recovering from the surgery. She has a slightly sore open spot which is healing well.  REVIEW OF SYSTEMS:   Constitutional: Denies fevers, chills or abnormal weight loss Eyes: Denies blurriness of vision Ears, nose, mouth, throat, and face:  Denies mucositis or sore throat Respiratory: Denies cough, dyspnea or wheezes Cardiovascular: Denies palpitation, chest discomfort or lower extremity swelling Gastrointestinal:  Denies nausea, heartburn or change in bowel habits Skin: Denies abnormal skin rashes Lymphatics: Denies new lymphadenopathy or easy bruising Neurological:Denies numbness, tingling or new weaknesses Behavioral/Psych: Mood is stable, no new changes  Breast:  denies any pain or lumps or nodules in either breasts All other systems were reviewed with the patient and are negative.  I have reviewed the past medical history, past surgical history, social history and family history with the patient and they are unchanged from previous note.  ALLERGIES:  is allergic to codeine.  MEDICATIONS:  Current Outpatient Prescriptions  Medication Sig Dispense Refill  . Alum Hydroxide-Mag Carbonate (GAVISCON PO) Take 2 tablets by mouth daily as needed (for heartburn).     . hydrOXYzine (ATARAX/VISTARIL) 10 MG tablet Take 1 tablet (10 mg total) by mouth at bedtime as needed for itching. 30 tablet 2  . triamcinolone cream (KENALOG) 0.1 % Apply 1 application topically 2 (two) times daily as needed (itching/irritation).   2  . anastrozole (ARIMIDEX) 1 MG tablet Take 1 tablet (1 mg total) by mouth daily. 90 tablet 3  . HYDROmorphone (DILAUDID) 2 MG tablet Take 1 tablet (2 mg total) by mouth every 4 (four) hours as needed for severe pain. (Patient not taking: Reported on 09/01/2014) 40 tablet 0  . zolpidem (AMBIEN) 10 MG tablet Take 10 mg by mouth at bedtime as needed for sleep.   0   No current facility-administered medications for this visit.    PHYSICAL EXAMINATION: ECOG PERFORMANCE  STATUS: 1 - Symptomatic but completely ambulatory  Filed Vitals:   09/01/14 1438  BP: 116/73  Pulse: 91  Temp: 98.3 F (36.8 C)  Resp: 18   Filed Weights   09/01/14 1438  Weight: 238 lb 8 oz (108.183 kg)    GENERAL:alert, no distress and  comfortable SKIN: skin color, texture, turgor are normal, no rashes or significant lesions EYES: normal, Conjunctiva are pink and non-injected, sclera clear OROPHARYNX:no exudate, no erythema and lips, buccal mucosa, and tongue normal  NECK: supple, thyroid normal size, non-tender, without nodularity LYMPH:  no palpable lymphadenopathy in the cervical, axillary or inguinal LUNGS: clear to auscultation and percussion with normal breathing effort HEART: regular rate & rhythm and no murmurs and no lower extremity edema ABDOMEN:abdomen soft, non-tender and normal bowel sounds Musculoskeletal:no cyanosis of digits and no clubbing  NEURO: alert & oriented x 3 with fluent speech, no focal motor/sensory deficits LABORATORY DATA:  I have reviewed the data as listed   Chemistry      Component Value Date/Time   NA 142 09/01/2014 1354   NA 140 04/14/2014 1108   K 4.3 09/01/2014 1354   K 4.9 04/14/2014 1108   CL 100 04/14/2014 1108   CO2 28 09/01/2014 1354   CO2 26 04/14/2014 1108   BUN 11.0 09/01/2014 1354   BUN 9 04/14/2014 1108   CREATININE 0.8 09/01/2014 1354   CREATININE 0.70 04/14/2014 1108      Component Value Date/Time   CALCIUM 8.9 09/01/2014 1354   CALCIUM 9.0 04/14/2014 1108   ALKPHOS 82 09/01/2014 1354   ALKPHOS 68 04/14/2014 1108   AST 14 09/01/2014 1354   AST 14 04/14/2014 1108   ALT 14 09/01/2014 1354   ALT 15 04/14/2014 1108   BILITOT 0.48 09/01/2014 1354   BILITOT 0.2* 04/14/2014 1108       Lab Results  Component Value Date   WBC 6.5 09/01/2014   HGB 11.3* 09/01/2014   HCT 36.1 09/01/2014   MCV 84.1 09/01/2014   PLT 411* 09/01/2014   NEUTROABS 4.3 09/01/2014   ASSESSMENT & PLAN:  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 N0 M0 stage IA ER 100%, PR 95%, HER-2 negative ratio 1.9, Ki-67 15% with additional satellite nodule 0.6 cm:  Oncotype DX recurrence score is 13 (9% risk of recurrence)  Antiestrogen therapy: Patient took tamoxifen from 05/26/2014 to 06/07/2014 and stopped it because of itching. Patient had undergone oophorectomy with TAH for reduction of risk of ovarian cancer. I discussed with her regarding the role of aromatase inhibitors. I would recommend antiestrogen therapy with anastrozole 1 mg daily. Since she just had oophorectomy early in January, I recommended that she start antiestrogen therapy sometime in April. This will enable her body to get used to menopausal state. I would like to see her in June 2016 to assess tolerability to treatment.  Breast cancer surveillance: 1. Since she had bilateral mastectomies and is no role of imaging studies. 2. Breast reconstruction: Patient has not decided who she would like for her breast reconstruction. She met with Dr. Marzetta Board in December 2015.  Return to clinic in June for follow-up. No orders of the defined types were placed in this encounter.   The patient has a good understanding of the overall plan. she agrees with it. She will call with any problems that may develop before her next visit here.   Pamelia Hoit,  Tyler Deis, MD

## 2014-09-01 NOTE — Assessment & Plan Note (Addendum)
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 N0 M0 stage IA ER 100%, PR 95%, HER-2 negative ratio 1.9, Ki-67 15% with additional satellite nodule 0.6 cm: Oncotype DX recurrence score is 13 (9% risk of recurrence)  Antiestrogen therapy: Patient took tamoxifen from 05/26/2014 to 06/07/2014 and stopped it because of itching. Patient had undergone oophorectomy with TAH for reduction of risk of ovarian cancer. I discussed with her regarding the role of aromatase inhibitors. I would recommend antiestrogen therapy with anastrozole 1 mg daily. Since she just had oophorectomy early in January, I recommended that she start antiestrogen therapy sometime in April. This will enable her body to get used to menopausal state. I would like to see her in June 2016 to assess tolerability to treatment.  Breast cancer surveillance: 1. Since she had bilateral mastectomies and is no role of imaging studies. 2. Breast reconstruction: Patient has not decided who she would like for her breast reconstruction. She met with Dr. Leland Johns in December 2015.  Return to clinic in June for follow-up.

## 2014-09-02 ENCOUNTER — Inpatient Hospital Stay: Admission: RE | Admit: 2014-09-02 | Payer: Self-pay | Source: Ambulatory Visit

## 2014-09-09 ENCOUNTER — Other Ambulatory Visit: Payer: Self-pay | Admitting: *Deleted

## 2014-09-09 DIAGNOSIS — C50112 Malignant neoplasm of central portion of left female breast: Secondary | ICD-10-CM

## 2014-09-09 MED ORDER — ZOLPIDEM TARTRATE 5 MG PO TABS: 5.0000 mg | ORAL_TABLET | Freq: Every evening | ORAL | Status: DC | PRN

## 2014-11-09 ENCOUNTER — Encounter (HOSPITAL_BASED_OUTPATIENT_CLINIC_OR_DEPARTMENT_OTHER): Payer: Self-pay | Admitting: *Deleted

## 2014-11-09 NOTE — Progress Notes (Signed)
Pt has been here several times-will bring overnight bag-meds-

## 2014-11-12 NOTE — H&P (Signed)
  Subjective:   Patient ID: Krystal [name] is a 49 y.o. female.  HPI  7 months post operative bilateral mastectomy with immediate ADM/TE reconstruction. Required minor debridement mastectomy flaps for necrosis. Taken off tamoxifen due to itchiness, but continued following this. States she had bx with Dermatology and possible drug reaction noted. Last visit was on prednisone oral and topical steroids; presently off all meds. States itching has resolved, did not have formal allergy testing.   Final pathology with no residual melanoma. Right breast benign. Left breast with multifocal IDC 1.5 cm and 0.6 cm, all SLN negative.Oncotype 13, no chemotherapy.   Initially presented for changing mole back. She underwent shave biopsy showing superficial spreading melanoma 1.7 mm, no ulceration. Following diagnosis of melanoma, she noted a left breast mass. MMG showed an irregular mass and calcs in left breast total dimension was 2.4 cm. US showed a 8 mm mass in left breast. Biopsy showed IDC Er/PR+, Her 2 -. MRI with known malignancy in the anterior left breast 1.6 cm and additional suspicious linear clumped enhancement extends posteriorly from the mass, with total extent measuring up to 5 cm. An additional site of linear clumped enhancement in the lower far inner left breast spans a distance of 3.1 cm.  BRCA1+Prior 40/42 C/D. Desires similar. R mastectomy wt 972 g, L mastectomy 947 g  1 ppd smoker,quit just prior to surgery Review of Systems     Objective:   Physical Exam  Cardiovascular: Normal rate, regular rhythm and normal heart sounds.  Pulmonary/Chest: Effort normal and breath sounds normal.  Abdominal: Soft.    BW R 15 L15   Bilateral breasts soft, depression over axillae and bilateral superior poles Square shape of breast mounds medially bilaterally + animation deformity bilaterally Assessment:     Hx L breast cancer BRCA+ Melanoma back     Plan:     Elected for round  silicone. Reviewed that future implants will be smaller than her native breast volume. Will plan lipofilling chest to address superior pole and axillary depressions. Reviewed risks including but not limited to implant rupture, contracture, rippling, seroma, hematoma, unacceptable cosmetic appearance, need for additional surgery, asymmetry, infection, DVT/PE, cardiopulmonary complications, damage to deeper structures, implant displacement lateraly. Discussed need for implant replacement in future due to risk rupture, need for MRI to detect rupture. Reviewed pain and incision, binder abdomen and need for repeat treatment given variable take fat. Plan overnight stay. Pictures taken.   We reviewed implant catalogue and pt used breast sizers. We discussed smooth vs textured implants. She understands that 800 ml is largest size implant. After reviewing base width, projection implants and utilizing sizers over her expander today, she would like to be in the 800 ml range if possible.    Mentor style 9200 450 ml expanders. Total fill volume Right 465 ml  Total fill volume Left 465 ml  Irene Limbo, MD [facility] Plastic & Reconstructive Surgery [phone]

## 2014-11-13 ENCOUNTER — Encounter (HOSPITAL_BASED_OUTPATIENT_CLINIC_OR_DEPARTMENT_OTHER): Payer: Self-pay | Admitting: Anesthesiology

## 2014-11-13 ENCOUNTER — Encounter (HOSPITAL_BASED_OUTPATIENT_CLINIC_OR_DEPARTMENT_OTHER)
Admission: RE | Disposition: A | Discharge status: Home / Self Care | Payer: Self-pay | Source: Ambulatory Visit | Attending: Plastic Surgery

## 2014-11-13 ENCOUNTER — Ambulatory Visit (HOSPITAL_BASED_OUTPATIENT_CLINIC_OR_DEPARTMENT_OTHER): Payer: 59 | Admitting: Anesthesiology

## 2014-11-13 ENCOUNTER — Ambulatory Visit (HOSPITAL_BASED_OUTPATIENT_CLINIC_OR_DEPARTMENT_OTHER)
Admission: RE | Admit: 2014-11-13 | Discharge: 2014-11-14 | Disposition: A | Discharge status: Home / Self Care | Payer: 59 | Source: Ambulatory Visit | Attending: Plastic Surgery | Admitting: Plastic Surgery

## 2014-11-13 DIAGNOSIS — Z421 Encounter for breast reconstruction following mastectomy: Secondary | ICD-10-CM | POA: Insufficient documentation

## 2014-11-13 DIAGNOSIS — Z9013 Acquired absence of bilateral breasts and nipples: Secondary | ICD-10-CM | POA: Insufficient documentation

## 2014-11-13 DIAGNOSIS — Z9010 Acquired absence of unspecified breast and nipple: Secondary | ICD-10-CM

## 2014-11-13 DIAGNOSIS — Z853 Personal history of malignant neoplasm of breast: Secondary | ICD-10-CM | POA: Diagnosis not present

## 2014-11-13 HISTORY — PX: LIPOSUCTION WITH LIPOFILLING: SHX6436

## 2014-11-13 HISTORY — PX: REMOVAL OF BILATERAL TISSUE EXPANDERS WITH PLACEMENT OF BILATERAL BREAST IMPLANTS: SHX6431

## 2014-11-13 SURGERY — REMOVAL, TISSUE EXPANDER, BREAST, BILATERAL, WITH BILATERAL IMPLANT IMPLANT INSERTION
Anesthesia: General | Site: Chest | Laterality: Bilateral

## 2014-11-13 MED ORDER — ACETAMINOPHEN 10 MG/ML IV SOLN
INTRAVENOUS | Status: AC
  Filled 2014-11-13: qty 100

## 2014-11-13 MED ORDER — ONDANSETRON HCL 4 MG PO TABS
4.0000 mg | ORAL_TABLET | Freq: Four times a day (QID) | ORAL | Status: DC | PRN
  Administered 2014-11-14: 4 mg via ORAL
  Filled 2014-11-13: qty 1

## 2014-11-13 MED ORDER — MORPHINE SULFATE 2 MG/ML IJ SOLN
2.0000 mg | INTRAMUSCULAR | Status: DC | PRN
  Filled 2014-11-13: qty 1

## 2014-11-13 MED ORDER — OXYCODONE HCL 5 MG/5ML PO SOLN: 5.0000 mg | Freq: Once | ORAL | Status: AC | PRN

## 2014-11-13 MED ORDER — SODIUM BICARBONATE 4 % IV SOLN
INTRAVENOUS | Status: AC
  Filled 2014-11-13: qty 5

## 2014-11-13 MED ORDER — HYDROMORPHONE HCL 1 MG/ML IJ SOLN
INTRAMUSCULAR | Status: AC
  Filled 2014-11-13: qty 1

## 2014-11-13 MED ORDER — HYDROMORPHONE HCL 1 MG/ML IJ SOLN
0.5000 mg | INTRAMUSCULAR | Status: DC | PRN
  Administered 2014-11-13: 0.5 mg via INTRAVENOUS

## 2014-11-13 MED ORDER — METHOCARBAMOL 500 MG PO TABS: 500.0000 mg | ORAL_TABLET | Freq: Three times a day (TID) | ORAL | Status: DC | PRN

## 2014-11-13 MED ORDER — OXYCODONE HCL 5 MG PO TABS: 5.0000 mg | ORAL_TABLET | Freq: Once | ORAL | Status: AC | PRN

## 2014-11-13 MED ORDER — PROPOFOL 10 MG/ML IV BOLUS
INTRAVENOUS | Status: DC | PRN
  Administered 2014-11-13: 200 mg via INTRAVENOUS

## 2014-11-13 MED ORDER — SULFAMETHOXAZOLE-TRIMETHOPRIM 800-160 MG PO TABS
1.0000 | ORAL_TABLET | Freq: Two times a day (BID) | ORAL | Status: DC
Start: 2014-11-13 — End: 2015-09-10

## 2014-11-13 MED ORDER — MIDAZOLAM HCL 2 MG/2ML IJ SOLN
1.0000 mg | INTRAMUSCULAR | Status: DC | PRN
  Administered 2014-11-13: 2 mg via INTRAVENOUS

## 2014-11-13 MED ORDER — LIDOCAINE HCL (PF) 1 % IJ SOLN
INTRAMUSCULAR | Status: AC
  Filled 2014-11-13: qty 60

## 2014-11-13 MED ORDER — SODIUM CHLORIDE 0.9 % IV SOLN
INTRAVENOUS | Status: DC | PRN
  Administered 2014-11-13: 1000 mL

## 2014-11-13 MED ORDER — OXYCODONE HCL 5 MG PO TABS
ORAL_TABLET | ORAL | Status: AC
  Filled 2014-11-13: qty 2

## 2014-11-13 MED ORDER — CEFAZOLIN SODIUM-DEXTROSE 2-3 GM-% IV SOLR
2.0000 g | INTRAVENOUS | Status: AC
  Administered 2014-11-13: 2 g via INTRAVENOUS

## 2014-11-13 MED ORDER — ONDANSETRON HCL 4 MG/2ML IJ SOLN
INTRAMUSCULAR | Status: DC | PRN
  Administered 2014-11-13: 4 mg via INTRAVENOUS

## 2014-11-13 MED ORDER — ZOLPIDEM TARTRATE 5 MG PO TABS
5.0000 mg | ORAL_TABLET | Freq: Every evening | ORAL | Status: DC | PRN
Start: 2014-11-13 — End: 2014-11-14

## 2014-11-13 MED ORDER — CHLORHEXIDINE GLUCONATE 4 % EX LIQD: 1.0000 "application " | Freq: Once | CUTANEOUS | Status: DC

## 2014-11-13 MED ORDER — MIDAZOLAM HCL 5 MG/5ML IJ SOLN
INTRAMUSCULAR | Status: DC | PRN
  Administered 2014-11-13 (×2): 2 mg via INTRAVENOUS

## 2014-11-13 MED ORDER — SUCCINYLCHOLINE CHLORIDE 20 MG/ML IJ SOLN
INTRAMUSCULAR | Status: DC | PRN
  Administered 2014-11-13: 50 mg via INTRAVENOUS

## 2014-11-13 MED ORDER — KCL IN DEXTROSE-NACL 20-5-0.45 MEQ/L-%-% IV SOLN
INTRAVENOUS | Status: DC
  Administered 2014-11-13 – 2014-11-14 (×2): via INTRAVENOUS
  Filled 2014-11-13 (×2): qty 1000

## 2014-11-13 MED ORDER — CEFAZOLIN SODIUM 1-5 GM-% IV SOLN
1.0000 g | Freq: Three times a day (TID) | INTRAVENOUS | Status: AC
  Administered 2014-11-13 – 2014-11-14 (×3): 1 g via INTRAVENOUS

## 2014-11-13 MED ORDER — SODIUM BICARBONATE 4 % IV SOLN
INTRAVENOUS | Status: DC | PRN
  Administered 2014-11-13: 450 mL via INTRAMUSCULAR

## 2014-11-13 MED ORDER — LIDOCAINE HCL (CARDIAC) 20 MG/ML IV SOLN
INTRAVENOUS | Status: DC | PRN
  Administered 2014-11-13: 50 mg via INTRAVENOUS

## 2014-11-13 MED ORDER — FENTANYL CITRATE 0.05 MG/ML IJ SOLN
INTRAMUSCULAR | Status: AC
  Filled 2014-11-13: qty 8

## 2014-11-13 MED ORDER — MIDAZOLAM HCL 2 MG/2ML IJ SOLN
INTRAMUSCULAR | Status: AC
  Filled 2014-11-13: qty 2

## 2014-11-13 MED ORDER — FENTANYL CITRATE 0.05 MG/ML IJ SOLN
INTRAMUSCULAR | Status: AC
  Filled 2014-11-13: qty 2

## 2014-11-13 MED ORDER — CEFAZOLIN SODIUM 1-5 GM-% IV SOLN
INTRAVENOUS | Status: AC
  Filled 2014-11-13: qty 150

## 2014-11-13 MED ORDER — OXYCODONE HCL 5 MG PO TABS
5.0000 mg | ORAL_TABLET | ORAL | Status: DC | PRN
Start: 2014-11-13 — End: 2014-11-14
  Administered 2014-11-13 – 2014-11-14 (×5): 10 mg via ORAL
  Filled 2014-11-13 (×4): qty 2

## 2014-11-13 MED ORDER — ACETAMINOPHEN 10 MG/ML IV SOLN
1000.0000 mg | Freq: Four times a day (QID) | INTRAVENOUS | Status: AC
  Administered 2014-11-13 – 2014-11-14 (×4): 1000 mg via INTRAVENOUS
  Filled 2014-11-13 (×2): qty 100

## 2014-11-13 MED ORDER — OXYCODONE HCL 5 MG PO TABS: 5.0000 mg | ORAL_TABLET | ORAL | Status: DC | PRN

## 2014-11-13 MED ORDER — LACTATED RINGERS IV SOLN
INTRAVENOUS | Status: DC
  Administered 2014-11-13: 10 mL/h via INTRAVENOUS
  Administered 2014-11-13: 08:00:00 via INTRAVENOUS

## 2014-11-13 MED ORDER — FENTANYL CITRATE 0.05 MG/ML IJ SOLN
50.0000 ug | INTRAMUSCULAR | Status: DC | PRN
  Administered 2014-11-13: 50 ug via INTRAVENOUS

## 2014-11-13 MED ORDER — ONDANSETRON HCL 4 MG/2ML IJ SOLN
4.0000 mg | Freq: Four times a day (QID) | INTRAMUSCULAR | Status: DC | PRN
  Administered 2014-11-13: 4 mg via INTRAVENOUS
  Filled 2014-11-13: qty 2

## 2014-11-13 MED ORDER — HYDROXYZINE HCL 10 MG PO TABS: 10.0000 mg | ORAL_TABLET | Freq: Every evening | ORAL | Status: DC | PRN

## 2014-11-13 MED ORDER — EPINEPHRINE HCL 1 MG/ML IJ SOLN
INTRAMUSCULAR | Status: AC
  Filled 2014-11-13: qty 1

## 2014-11-13 MED ORDER — FENTANYL CITRATE 0.05 MG/ML IJ SOLN
25.0000 ug | INTRAMUSCULAR | Status: DC | PRN
  Administered 2014-11-13 (×3): 50 ug via INTRAVENOUS

## 2014-11-13 MED ORDER — FENTANYL CITRATE 0.05 MG/ML IJ SOLN
INTRAMUSCULAR | Status: DC | PRN
  Administered 2014-11-13: 25 ug via INTRAVENOUS
  Administered 2014-11-13 (×2): 50 ug via INTRAVENOUS
  Administered 2014-11-13: 25 ug via INTRAVENOUS
  Administered 2014-11-13 (×2): 50 ug via INTRAVENOUS
  Administered 2014-11-13 (×2): 25 ug via INTRAVENOUS

## 2014-11-13 MED ORDER — ONDANSETRON HCL 4 MG/2ML IJ SOLN: 4.0000 mg | Freq: Four times a day (QID) | INTRAMUSCULAR | Status: DC | PRN

## 2014-11-13 MED ORDER — KETOROLAC TROMETHAMINE 15 MG/ML IJ SOLN
15.0000 mg | Freq: Three times a day (TID) | INTRAMUSCULAR | Status: AC
  Administered 2014-11-13 – 2014-11-14 (×3): 15 mg via INTRAVENOUS
  Filled 2014-11-13 (×3): qty 1

## 2014-11-13 MED ORDER — ROCURONIUM BROMIDE 100 MG/10ML IV SOLN
INTRAVENOUS | Status: DC | PRN
  Administered 2014-11-13: 5 mg via INTRAVENOUS

## 2014-11-13 MED ORDER — DEXAMETHASONE SODIUM PHOSPHATE 4 MG/ML IJ SOLN
INTRAMUSCULAR | Status: DC | PRN
  Administered 2014-11-13: 10 mg via INTRAVENOUS

## 2014-11-13 SURGICAL SUPPLY — 86 items
BAG DECANTER FOR FLEXI CONT (MISCELLANEOUS) ×2 IMPLANT
BINDER ABDOMINAL 10 UNV 27-48 (MISCELLANEOUS) ×1 IMPLANT
BINDER ABDOMINAL 12 SM 30-45 (SOFTGOODS) IMPLANT
BINDER BREAST LRG (GAUZE/BANDAGES/DRESSINGS) IMPLANT
BINDER BREAST MEDIUM (GAUZE/BANDAGES/DRESSINGS) IMPLANT
BINDER BREAST XLRG (GAUZE/BANDAGES/DRESSINGS) ×1 IMPLANT
BINDER BREAST XXLRG (GAUZE/BANDAGES/DRESSINGS) IMPLANT
BLADE SURG 10 STRL SS (BLADE) ×3 IMPLANT
BLADE SURG 11 STRL SS (BLADE) ×1 IMPLANT
BLADE SURG 15 STRL LF DISP TIS (BLADE) ×1 IMPLANT
BLADE SURG 15 STRL SS (BLADE) ×4
BNDG GAUZE ELAST 4 BULKY (GAUZE/BANDAGES/DRESSINGS) ×4 IMPLANT
CANISTER LIPO FAT HARVEST (MISCELLANEOUS) ×2 IMPLANT
CANISTER SUCT 1200ML W/VALVE (MISCELLANEOUS) ×2 IMPLANT
CHLORAPREP W/TINT 26ML (MISCELLANEOUS) ×2 IMPLANT
COVER BACK TABLE 60X90IN (DRAPES) ×2 IMPLANT
COVER MAYO STAND STRL (DRAPES) ×2 IMPLANT
DECANTER SPIKE VIAL GLASS SM (MISCELLANEOUS) IMPLANT
DRAIN CHANNEL 15F RND FF W/TCR (WOUND CARE) ×2 IMPLANT
DRAPE LAPAROSCOPIC ABDOMINAL (DRAPES) ×2 IMPLANT
DRSG PAD ABDOMINAL 8X10 ST (GAUZE/BANDAGES/DRESSINGS) ×4 IMPLANT
DRSG TEGADERM 2-3/8X2-3/4 SM (GAUZE/BANDAGES/DRESSINGS) ×1 IMPLANT
ELECT BLADE 4.0 EZ CLEAN MEGAD (MISCELLANEOUS) ×2
ELECT COATED BLADE 2.86 ST (ELECTRODE) ×2 IMPLANT
ELECT REM PT RETURN 9FT ADLT (ELECTROSURGICAL) ×2
ELECTRODE BLDE 4.0 EZ CLN MEGD (MISCELLANEOUS) ×1 IMPLANT
ELECTRODE REM PT RTRN 9FT ADLT (ELECTROSURGICAL) ×1 IMPLANT
EVACUATOR SILICONE 100CC (DRAIN) ×3 IMPLANT
FILTER LIPOSUCTION (MISCELLANEOUS) ×2 IMPLANT
GLOVE BIO SURGEON STRL SZ 6 (GLOVE) ×4 IMPLANT
GLOVE BIO SURGEON STRL SZ 6.5 (GLOVE) IMPLANT
GLOVE BIOGEL PI IND STRL 6.5 (GLOVE) IMPLANT
GLOVE BIOGEL PI IND STRL 7.0 (GLOVE) IMPLANT
GLOVE BIOGEL PI INDICATOR 6.5 (GLOVE) ×1
GLOVE BIOGEL PI INDICATOR 7.0 (GLOVE) ×1
GOWN STRL REUS W/ TWL LRG LVL3 (GOWN DISPOSABLE) ×2 IMPLANT
GOWN STRL REUS W/TWL LRG LVL3 (GOWN DISPOSABLE) ×4
IIMPLANT SILICONE 800CC (Breast) ×2 IMPLANT
IV NS 500ML (IV SOLUTION)
IV NS 500ML BAXH (IV SOLUTION) IMPLANT
KIT FILL SYSTEM UNIVERSAL (SET/KITS/TRAYS/PACK) IMPLANT
LINER CANISTER 1000CC FLEX (MISCELLANEOUS) ×2 IMPLANT
LIQUID BAND (GAUZE/BANDAGES/DRESSINGS) ×2 IMPLANT
NDL HYPO 25X1 1.5 SAFETY (NEEDLE) IMPLANT
NDL SAFETY ECLIPSE 18X1.5 (NEEDLE) ×1 IMPLANT
NEEDLE HYPO 18GX1.5 SHARP (NEEDLE) ×2
NEEDLE HYPO 25X1 1.5 SAFETY (NEEDLE) IMPLANT
NS IRRIG 1000ML POUR BTL (IV SOLUTION) ×1 IMPLANT
PACK BASIN DAY SURGERY FS (CUSTOM PROCEDURE TRAY) ×2 IMPLANT
PACK UNIVERSAL I (CUSTOM PROCEDURE TRAY) ×2 IMPLANT
PAD ALCOHOL SWAB (MISCELLANEOUS) ×2 IMPLANT
PENCIL BUTTON HOLSTER BLD 10FT (ELECTRODE) ×2 IMPLANT
PIN SAFETY STERILE (MISCELLANEOUS) ×2 IMPLANT
SHEET MEDIUM DRAPE 40X70 STRL (DRAPES) ×4 IMPLANT
SIZER BREAST GENERIC (SIZER) ×1 IMPLANT
SIZER BREAST REUSE 750CC (SIZER) ×2
SIZER BREAST SIL REUSE 800CC (SIZER) ×2
SIZER BRST REUSE P6.6 750CC (SIZER) IMPLANT
SIZER BRST SIL REUSE 800CC (SIZER) IMPLANT
SIZER GENERIC MENTOR (SIZER) ×1 IMPLANT
SLEEVE SCD COMPRESS KNEE MED (MISCELLANEOUS) ×2 IMPLANT
SPONGE LAP 18X18 X RAY DECT (DISPOSABLE) ×5 IMPLANT
STAPLER VISISTAT 35W (STAPLE) ×2 IMPLANT
SUT ETHILON 2 0 FS 18 (SUTURE) ×3 IMPLANT
SUT MNCRL AB 4-0 PS2 18 (SUTURE) ×4 IMPLANT
SUT PDS 3-0 CT2 (SUTURE)
SUT PDS AB 2-0 CT2 27 (SUTURE) IMPLANT
SUT PDS II 3-0 CT2 27 ABS (SUTURE) IMPLANT
SUT PROLENE 2 0 CT2 30 (SUTURE) ×2 IMPLANT
SUT VIC AB 3-0 PS1 18 (SUTURE)
SUT VIC AB 3-0 PS1 18XBRD (SUTURE) IMPLANT
SUT VIC AB 3-0 SH 27 (SUTURE) ×6
SUT VIC AB 3-0 SH 27X BRD (SUTURE) ×2 IMPLANT
SUT VICRYL 4-0 PS2 18IN ABS (SUTURE) ×4 IMPLANT
SYR 20CC LL (SYRINGE) IMPLANT
SYR 50ML LL SCALE MARK (SYRINGE) ×5 IMPLANT
SYR BULB IRRIGATION 50ML (SYRINGE) ×2 IMPLANT
SYR CONTROL 10ML LL (SYRINGE) ×3 IMPLANT
SYR TB 1ML LL NO SAFETY (SYRINGE) ×1 IMPLANT
SYRINGE 10CC LL (SYRINGE) ×7 IMPLANT
TOWEL OR 17X24 6PK STRL BLUE (TOWEL DISPOSABLE) ×5 IMPLANT
TUBE CONNECTING 20X1/4 (TUBING) ×2 IMPLANT
TUBING INFILTRATION IT-10001 (TUBING) IMPLANT
TUBING SET GRADUATE ASPIR 12FT (MISCELLANEOUS) ×2 IMPLANT
UNDERPAD 30X30 INCONTINENT (UNDERPADS AND DIAPERS) ×4 IMPLANT
YANKAUER SUCT BULB TIP NO VENT (SUCTIONS) ×2 IMPLANT

## 2014-11-13 NOTE — Transfer of Care (Signed)
Immediate Anesthesia Transfer of Care Note  Patient: Krystal [name]  Procedure(s) Performed: Procedure(s): REMOVAL OF BILATERAL TISSUE EXPANDERS WITH PLACEMENT OF BILATERAL SILICONE IMPLANTS  (Bilateral) LIPOFILLING TO BILATERAL CHEST  (Bilateral)  Patient Location: PACU  Anesthesia Type:General  Level of Consciousness: sedated  Airway & Oxygen Therapy: Patient Spontanous Breathing and Patient connected to face mask oxygen  Post-op Assessment: Report given to RN and Post -op Vital signs reviewed and stable  Post vital signs: Reviewed and stable  Last Vitals:  Filed Vitals:   11/13/14 1122  BP:   Pulse: 107  Temp:   Resp: 18    Complications: No apparent anesthesia complications

## 2014-11-13 NOTE — Anesthesia Postprocedure Evaluation (Signed)
Anesthesia Post Note  Patient: Krystal [name]  Procedure(s) Performed: Procedure(s) (LRB): REMOVAL OF BILATERAL TISSUE EXPANDERS WITH PLACEMENT OF BILATERAL SILICONE IMPLANTS  (Bilateral) LIPOFILLING TO BILATERAL CHEST  (Bilateral)  Anesthesia type: General  Patient location: PACU  Post pain: Pain level controlled and Adequate analgesia  Post assessment: Post-op Vital signs reviewed, Patient's Cardiovascular Status Stable, Respiratory Function Stable, Patent Airway and Pain level controlled  Last Vitals:  Filed Vitals:   11/13/14 1200  BP: 119/70  Pulse: 100  Temp:   Resp: 14    Post vital signs: Reviewed and stable  Level of consciousness: awake, alert  and oriented  Complications: No apparent anesthesia complications

## 2014-11-13 NOTE — Anesthesia Procedure Notes (Signed)
Procedure Name: Intubation Date/Time: 11/13/2014 8:29 AM Performed by: Marrianne Mood Pre-anesthesia Checklist: Patient identified, Emergency Drugs available, Suction available, Patient being monitored and Timeout performed Patient Re-evaluated:Patient Re-evaluated prior to inductionOxygen Delivery Method: Circle System Utilized Preoxygenation: Pre-oxygenation with 100% oxygen Intubation Type: IV induction Ventilation: Mask ventilation without difficulty Laryngoscope Size: Miller and 3 Grade View: Grade III Tube type: Oral Tube size: 7.0 mm Number of attempts: 1 Airway Equipment and Method: Stylet and Oral airway Placement Confirmation: ETT inserted through vocal cords under direct vision,  positive ETCO2 and breath sounds checked- equal and bilateral Secured at: 22 cm Tube secured with: Tape Dental Injury: Teeth and Oropharynx as per pre-operative assessment  Comments: Both front incisors chipped prior to intubation.  Dr Marcie Bal wittness to preexisting condition.

## 2014-11-13 NOTE — Interval H&P Note (Signed)
History and Physical Interval Note:  11/13/2014 6:56 AM  Krystal [name]  has presented today for surgery, with the diagnosis of BRCA1 BREAST CANCER BILATERAL   The various methods of treatment have been discussed with the patient and family. After consideration of risks, benefits and other options for treatment, the patient has consented to  Procedure(s): REMOVAL OF BILATERAL TISSUE EXPANDERS WITH PLACEMENT OF BILATERAL SILICONE IMPLANTS  (Bilateral) LIPOFILLING TO BILATERAL CHEST  (Bilateral) as a surgical intervention .  The patient's history has been reviewed, patient examined, no change in status, stable for surgery.  I have reviewed the patient's chart and labs.  Questions were answered to the patient's satisfaction.     Krystal [name]

## 2014-11-13 NOTE — Op Note (Signed)
Operative Note   DATE OF OPERATION: 4.8.2016  LOCATION: [facility]-observation  SURGICAL DIVISION: Plastic Surgery  PREOPERATIVE DIAGNOSES:  1. Personal history of breast cancer 2. Acquired absence bilateral breasts  POSTOPERATIVE DIAGNOSES:  same  PROCEDURE:  1. Removal bilateral expanders and placement silicone implants 2. Lipofilling to bilateral reconstructed breasts 3. Left capsulorraphy  SURGEON: Irene Limbo MD MBA  ASSISTANT: none  ANESTHESIA:  General.   EBL: 60 ml  COMPLICATIONS: None immediate.   INDICATIONS FOR PROCEDURE:  The patient, Krystal [name], is a 49 y.o. female born on Nov 05, 1965, is here for second stage breast reconstruction following immediate expander placement.   FINDINGS: Mentor Smooth Ultra High Profile 800 ml implants placed bilaterally. Ref [id number] Right SN [id number] Left SN [id number]. 80 ml fat grafted to right breast and 80 ml fat grafted to left breast.  DESCRIPTION OF PROCEDURE:  The patient's operative site was marked with the patient in the preoperative area to mark sternal notch, anterior axillary lines, chest midline and desired areas for grafting. Supraumbilical abdomen marked for harvest.. The patient was taken to the operating room. SCDs were placed and IV antibiotics were given. The patient's operative site was prepped and draped in a sterile fashion. A time out was performed and all information was confirmed to be correct. Stab incision made over bilateral lateral abdomen and tumescent fluid infiltrated over supraumbilical abdomen, total 400 ml tumescent infiltrated. Incision made in right chest mastectomy scar. The pectoralis muscle was divided in direction of muscle fibers and expander removed. The acelluar dermis was well incorporated. Capsulotomies performed medially and superiorly. Sizer was placed. I then directed attention to left breast. Incision made in prior scar, muscle divided, and expander removed. Capsulotomies  performed medially and superiorly. Submuscular dissection completed toward clavicle.   Power assisted liposuction performed over supraumbilical abdomen. Total of 300 ml lipoaspirate. The fat was then washed and prepared by gravity for infiltration. Harvested fat was then infiltrated in subcutaneous and intramuscular planes over superior medial poles, to recreate axillary tails, and within mastectomy flaps. A total of 160 ml fat was infiltrated bilaterally.   Left breast demonstrated wider cavity and lateral displacement of implant sizer. Lateral capsulorraphy performed with interrupted buried 2-0 prolene suture to narrow width and tack anterior axillary line to chest wall.  At this time bilateral cavities irrigated with solution containing Ancef, gentamicin, and bacitracin. 15 Fr drains placed in right and left breast cavity and secured with 2-0 nylon. A Mentor Smooth Ultra High Profile implant 800 ml was placed in each cavity. Closure was completed with interrupted 3-0 vicryl for approximation of capsule and pectoralis muscle. 4-0 vicryl was placed in dermis and running 4-0 monocryl was used to close skin. Dermabond was applied. The abdominal incisions were approximated with 4-0 monocryl. Dry dressing and abdominal and breast binder applied.   The patient was allowed to wake from anesthesia, extubated and taken to the recovery room in satisfactory condition.   SPECIMENS: none  DRAINS: 15 Fr JP in right and left reconstructed breast  Irene Limbo, MD [facility] Plastic & Reconstructive Surgery [phone]

## 2014-11-13 NOTE — Anesthesia Preprocedure Evaluation (Signed)
Anesthesia Evaluation  Patient identified by MRN, date of birth, ID band Patient awake    Reviewed: Allergy & Precautions, NPO status , Patient's Chart, lab work & pertinent test results  History of Anesthesia Complications (+) PONV  Airway Mallampati: II   Neck ROM: full    Dental   Pulmonary former smoker,          Cardiovascular negative cardio ROS      Neuro/Psych    GI/Hepatic   Endo/Other  Morbid obesity  Renal/GU      Musculoskeletal   Abdominal   Peds  Hematology   Anesthesia Other Findings   Reproductive/Obstetrics                             Anesthesia Physical Anesthesia Plan  ASA: II  Anesthesia Plan: General   Post-op Pain Management:    Induction: Intravenous  Airway Management Planned: Oral ETT  Additional Equipment:   Intra-op Plan:   Post-operative Plan: Extubation in OR  Informed Consent: I have reviewed the patients History and Physical, chart, labs and discussed the procedure including the risks, benefits and alternatives for the proposed anesthesia with the patient or authorized representative who has indicated his/her understanding and acceptance.     Plan Discussed with: CRNA, Anesthesiologist and Surgeon  Anesthesia Plan Comments:         Anesthesia Quick Evaluation

## 2014-11-14 DIAGNOSIS — Z421 Encounter for breast reconstruction following mastectomy: Secondary | ICD-10-CM | POA: Diagnosis not present

## 2014-11-14 MED ORDER — ACETAMINOPHEN 10 MG/ML IV SOLN
INTRAVENOUS | Status: AC
  Filled 2014-11-14: qty 100

## 2014-11-16 ENCOUNTER — Encounter (HOSPITAL_BASED_OUTPATIENT_CLINIC_OR_DEPARTMENT_OTHER): Payer: Self-pay | Admitting: Plastic Surgery

## 2015-01-15 ENCOUNTER — Telehealth: Payer: Self-pay | Admitting: Hematology and Oncology

## 2015-01-15 NOTE — Telephone Encounter (Signed)
Patient called in and left a message that she is having bilat ankle swelling and is concerned.  I have sent the voicemail to triage.

## 2015-01-15 NOTE — Telephone Encounter (Signed)
PT. ALSO HAS AN INSECT BITE ON TOP OF RIGHT FOOT WHICH IS HEALING. THE BILATERAL ANKLE SWELLING GOES DOWN OVERNIGHT. ENCOURAGED PT. TO ELEVATE HER LEGS HIGHER THAN HER HEART. SINCE THE MIDDLE OF MAY SHE HAS BEEN MORE TIRED. PT. HAS NOT STARTED THE ANASTROZOLE SO INSTRUCTED HER TO CONTACT HER PRIMARY CARE PHYSICIAN. PT. DOES NOT HAVE A PRIMARY CARE PHYSICIAN SO ENCOURAGED PT. TO OBTAIN A PRIMARY CARE PHYSICIAN. SINCE PT. HAS NOT STARTED THE ANASTROZOLE DOES HER APPOINTMENT ON 02/01/15 NEED TO BE RESCHEDULED?

## 2015-01-31 NOTE — Assessment & Plan Note (Signed)
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 N0 M0 stage IA ER 100%, PR 95%, HER-2 negative ratio 1.9, Ki-67 15% with additional satellite nodule 0.6 cm: Oncotype DX recurrence score is 13 9% risk of recurrence 08/13/14 BSO  Current Treatment: Was on Tamoxifen since 05/27/15; Since she had BSO, we can change her to Anastrozole 1 mg daily  Anastrozole counseling: We discussed the risks and benefits of anti-estrogen therapy with aromatase inhibitors. These include but not limited to insomnia, hot flashes, mood changes, vaginal dryness, bone density loss, and weight gain. Although rare, serious side effects including endometrial cancer, risk of blood clots were also discussed. We strongly believe that the benefits far outweigh the risks. Patient understands these risks and consented to starting treatment. Planned treatment duration is 5 years.

## 2015-02-01 ENCOUNTER — Telehealth: Payer: Self-pay | Admitting: Hematology and Oncology

## 2015-02-01 ENCOUNTER — Ambulatory Visit: Payer: 59 | Admitting: Hematology and Oncology

## 2015-02-01 NOTE — Telephone Encounter (Signed)
Patient called and rescheduled todays appointments due to car trouble

## 2015-02-10 ENCOUNTER — Ambulatory Visit: Payer: 59 | Admitting: Hematology and Oncology

## 2015-02-10 NOTE — Assessment & Plan Note (Signed)
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 N0 M0 stage IA ER 100%, PR 95%, HER-2 negative ratio 1.9, Ki-67 15% with additional satellite nodule 0.6 cm: Oncotype DX recurrence score is 13 (9% risk of recurrence)  Antiestrogen therapy: Patient took tamoxifen from 05/26/2014 to 06/07/2014 and stopped it because of itching. Patient had undergone oophorectomy with TAH for reduction of risk of ovarian cancer.  Current treatment: Anastrozole 1 mg daily started June 2016 Anastrozole toxicities:  Breast reconstruction surgery performed April 2016 Return to clinic in 6 months for follow-up

## 2015-09-01 ENCOUNTER — Other Ambulatory Visit: Payer: Self-pay | Admitting: Radiology

## 2015-09-08 ENCOUNTER — Other Ambulatory Visit: Payer: Self-pay | Admitting: General Surgery

## 2015-09-08 DIAGNOSIS — R222 Localized swelling, mass and lump, trunk: Secondary | ICD-10-CM

## 2015-09-08 DIAGNOSIS — C50112 Malignant neoplasm of central portion of left female breast: Secondary | ICD-10-CM

## 2015-09-08 HISTORY — DX: Localized swelling, mass and lump, trunk: R22.2

## 2015-09-10 ENCOUNTER — Encounter (HOSPITAL_BASED_OUTPATIENT_CLINIC_OR_DEPARTMENT_OTHER): Payer: Self-pay | Admitting: *Deleted

## 2015-09-10 DIAGNOSIS — J3489 Other specified disorders of nose and nasal sinuses: Secondary | ICD-10-CM

## 2015-09-10 DIAGNOSIS — R058 Other specified cough: Secondary | ICD-10-CM

## 2015-09-10 HISTORY — DX: Other specified cough: R05.8

## 2015-09-10 HISTORY — DX: Other specified disorders of nose and nasal sinuses: J34.89

## 2015-09-16 ENCOUNTER — Encounter (HOSPITAL_BASED_OUTPATIENT_CLINIC_OR_DEPARTMENT_OTHER)
Admission: RE | Disposition: A | Discharge status: Home / Self Care | Payer: Self-pay | Source: Ambulatory Visit | Attending: General Surgery

## 2015-09-16 ENCOUNTER — Ambulatory Visit (HOSPITAL_BASED_OUTPATIENT_CLINIC_OR_DEPARTMENT_OTHER)
Admission: RE | Admit: 2015-09-16 | Discharge: 2015-09-16 | Disposition: A | Discharge status: Home / Self Care | Payer: Commercial Managed Care - PPO | Source: Ambulatory Visit | Attending: General Surgery | Admitting: General Surgery

## 2015-09-16 ENCOUNTER — Ambulatory Visit (HOSPITAL_BASED_OUTPATIENT_CLINIC_OR_DEPARTMENT_OTHER): Payer: Commercial Managed Care - PPO | Admitting: Anesthesiology

## 2015-09-16 ENCOUNTER — Encounter (HOSPITAL_BASED_OUTPATIENT_CLINIC_OR_DEPARTMENT_OTHER): Payer: Self-pay | Admitting: *Deleted

## 2015-09-16 DIAGNOSIS — Z85820 Personal history of malignant melanoma of skin: Secondary | ICD-10-CM | POA: Insufficient documentation

## 2015-09-16 DIAGNOSIS — K219 Gastro-esophageal reflux disease without esophagitis: Secondary | ICD-10-CM | POA: Diagnosis not present

## 2015-09-16 DIAGNOSIS — F17200 Nicotine dependence, unspecified, uncomplicated: Secondary | ICD-10-CM | POA: Insufficient documentation

## 2015-09-16 DIAGNOSIS — Z79899 Other long term (current) drug therapy: Secondary | ICD-10-CM | POA: Insufficient documentation

## 2015-09-16 DIAGNOSIS — C50912 Malignant neoplasm of unspecified site of left female breast: Secondary | ICD-10-CM | POA: Diagnosis not present

## 2015-09-16 DIAGNOSIS — Z6841 Body Mass Index (BMI) 40.0 and over, adult: Secondary | ICD-10-CM | POA: Insufficient documentation

## 2015-09-16 DIAGNOSIS — N63 Unspecified lump in breast: Secondary | ICD-10-CM | POA: Diagnosis present

## 2015-09-16 DIAGNOSIS — Z885 Allergy status to narcotic agent status: Secondary | ICD-10-CM | POA: Diagnosis not present

## 2015-09-16 HISTORY — DX: Other specified disorders of nose and nasal sinuses: J34.89

## 2015-09-16 HISTORY — DX: Unspecified osteoarthritis, unspecified site: M19.90

## 2015-09-16 HISTORY — DX: Cough: R05

## 2015-09-16 HISTORY — PX: BREAST LUMPECTOMY: SHX2

## 2015-09-16 HISTORY — DX: Gastro-esophageal reflux disease without esophagitis: K21.9

## 2015-09-16 HISTORY — DX: Localized swelling, mass and lump, trunk: R22.2

## 2015-09-16 HISTORY — DX: Personal history of malignant neoplasm of breast: Z85.3

## 2015-09-16 HISTORY — DX: Dental restoration status: Z98.811

## 2015-09-16 SURGERY — BREAST LUMPECTOMY
Anesthesia: General | Site: Breast | Laterality: Left

## 2015-09-16 MED ORDER — ONDANSETRON HCL 4 MG/2ML IJ SOLN
INTRAMUSCULAR | Status: AC
  Filled 2015-09-16: qty 2

## 2015-09-16 MED ORDER — OXYCODONE HCL 5 MG PO TABS: 5.0000 mg | ORAL_TABLET | Freq: Four times a day (QID) | ORAL | Status: DC | PRN

## 2015-09-16 MED ORDER — ONDANSETRON HCL 4 MG/2ML IJ SOLN
INTRAMUSCULAR | Status: DC | PRN
  Administered 2015-09-16: 4 mg via INTRAVENOUS

## 2015-09-16 MED ORDER — LIDOCAINE HCL (PF) 1 % IJ SOLN
INTRAMUSCULAR | Status: AC
  Filled 2015-09-16: qty 30

## 2015-09-16 MED ORDER — OXYCODONE HCL 5 MG PO TABS
5.0000 mg | ORAL_TABLET | Freq: Once | ORAL | Status: AC | PRN
  Administered 2015-09-16: 5 mg via ORAL

## 2015-09-16 MED ORDER — CEFAZOLIN SODIUM-DEXTROSE 2-3 GM-% IV SOLR
INTRAVENOUS | Status: AC
  Filled 2015-09-16: qty 50

## 2015-09-16 MED ORDER — PROPOFOL 500 MG/50ML IV EMUL
INTRAVENOUS | Status: AC
  Filled 2015-09-16: qty 50

## 2015-09-16 MED ORDER — SCOPOLAMINE 1 MG/3DAYS TD PT72
MEDICATED_PATCH | TRANSDERMAL | Status: AC
  Filled 2015-09-16: qty 1

## 2015-09-16 MED ORDER — GLYCOPYRROLATE 0.2 MG/ML IJ SOLN: 0.2000 mg | Freq: Once | INTRAMUSCULAR | Status: DC | PRN

## 2015-09-16 MED ORDER — MIDAZOLAM HCL 2 MG/2ML IJ SOLN: 1.0000 mg | INTRAMUSCULAR | Status: DC | PRN

## 2015-09-16 MED ORDER — PROPOFOL 10 MG/ML IV BOLUS
INTRAVENOUS | Status: DC | PRN
  Administered 2015-09-16: 300 mg via INTRAVENOUS

## 2015-09-16 MED ORDER — OXYCODONE HCL 5 MG/5ML PO SOLN: 5.0000 mg | Freq: Once | ORAL | Status: AC | PRN

## 2015-09-16 MED ORDER — HYDROMORPHONE HCL 1 MG/ML IJ SOLN
INTRAMUSCULAR | Status: AC
  Filled 2015-09-16: qty 1

## 2015-09-16 MED ORDER — CEFAZOLIN SODIUM-DEXTROSE 2-3 GM-% IV SOLR
2.0000 g | INTRAVENOUS | Status: AC
  Administered 2015-09-16: 2 g via INTRAVENOUS

## 2015-09-16 MED ORDER — MIDAZOLAM HCL 2 MG/2ML IJ SOLN
INTRAMUSCULAR | Status: AC
  Filled 2015-09-16: qty 2

## 2015-09-16 MED ORDER — HYDROMORPHONE HCL 1 MG/ML IJ SOLN
0.2500 mg | INTRAMUSCULAR | Status: DC | PRN
  Administered 2015-09-16: 0.5 mg via INTRAVENOUS

## 2015-09-16 MED ORDER — FENTANYL CITRATE (PF) 100 MCG/2ML IJ SOLN: 50.0000 ug | INTRAMUSCULAR | Status: DC | PRN

## 2015-09-16 MED ORDER — BUPIVACAINE HCL (PF) 0.25 % IJ SOLN
INTRAMUSCULAR | Status: AC
  Filled 2015-09-16: qty 30

## 2015-09-16 MED ORDER — DEXAMETHASONE SODIUM PHOSPHATE 10 MG/ML IJ SOLN
INTRAMUSCULAR | Status: AC
  Filled 2015-09-16: qty 1

## 2015-09-16 MED ORDER — MIDAZOLAM HCL 5 MG/5ML IJ SOLN
INTRAMUSCULAR | Status: DC | PRN
  Administered 2015-09-16: 4 mg via INTRAVENOUS

## 2015-09-16 MED ORDER — PROMETHAZINE HCL 25 MG/ML IJ SOLN: 6.2500 mg | INTRAMUSCULAR | Status: DC | PRN

## 2015-09-16 MED ORDER — SODIUM BICARBONATE 4 % IV SOLN
INTRAVENOUS | Status: AC
  Filled 2015-09-16: qty 10

## 2015-09-16 MED ORDER — FENTANYL CITRATE (PF) 100 MCG/2ML IJ SOLN
INTRAMUSCULAR | Status: AC
Start: 2015-09-16 — End: 2015-09-16
  Filled 2015-09-16: qty 2

## 2015-09-16 MED ORDER — LACTATED RINGERS IV SOLN
INTRAVENOUS | Status: DC
  Administered 2015-09-16: 10 mL/h via INTRAVENOUS
  Administered 2015-09-16 (×2): via INTRAVENOUS

## 2015-09-16 MED ORDER — LIDOCAINE HCL (CARDIAC) 20 MG/ML IV SOLN
INTRAVENOUS | Status: AC
  Filled 2015-09-16: qty 5

## 2015-09-16 MED ORDER — OXYCODONE HCL 5 MG PO TABS
ORAL_TABLET | ORAL | Status: AC
  Filled 2015-09-16: qty 1

## 2015-09-16 MED ORDER — MEPERIDINE HCL 25 MG/ML IJ SOLN: 6.2500 mg | INTRAMUSCULAR | Status: DC | PRN

## 2015-09-16 MED ORDER — FENTANYL CITRATE (PF) 100 MCG/2ML IJ SOLN
INTRAMUSCULAR | Status: DC | PRN
  Administered 2015-09-16: 100 ug via INTRAVENOUS

## 2015-09-16 MED ORDER — BUPIVACAINE HCL (PF) 0.25 % IJ SOLN
INTRAMUSCULAR | Status: DC | PRN
  Administered 2015-09-16: 5 mL

## 2015-09-16 MED ORDER — DEXAMETHASONE SODIUM PHOSPHATE 4 MG/ML IJ SOLN
INTRAMUSCULAR | Status: DC | PRN
  Administered 2015-09-16: 10 mg via INTRAVENOUS

## 2015-09-16 MED ORDER — LIDOCAINE HCL (CARDIAC) 20 MG/ML IV SOLN
INTRAVENOUS | Status: DC | PRN
  Administered 2015-09-16: 50 mg via INTRAVENOUS

## 2015-09-16 MED ORDER — SCOPOLAMINE 1 MG/3DAYS TD PT72
1.0000 | MEDICATED_PATCH | Freq: Once | TRANSDERMAL | Status: DC | PRN
  Administered 2015-09-16: 1.5 mg via TRANSDERMAL

## 2015-09-16 SURGICAL SUPPLY — 56 items
APPLIER CLIP 9.375 MED OPEN (MISCELLANEOUS)
APR CLP MED 9.3 20 MLT OPN (MISCELLANEOUS)
BINDER BREAST LRG (GAUZE/BANDAGES/DRESSINGS) IMPLANT
BINDER BREAST MEDIUM (GAUZE/BANDAGES/DRESSINGS) IMPLANT
BINDER BREAST XLRG (GAUZE/BANDAGES/DRESSINGS) ×1 IMPLANT
BINDER BREAST XXLRG (GAUZE/BANDAGES/DRESSINGS) IMPLANT
BLADE SURG 15 STRL LF DISP TIS (BLADE) ×1 IMPLANT
BLADE SURG 15 STRL SS (BLADE) ×2
CANISTER SUCT 1200ML W/VALVE (MISCELLANEOUS) IMPLANT
CHLORAPREP W/TINT 26ML (MISCELLANEOUS) ×2 IMPLANT
CLIP APPLIE 9.375 MED OPEN (MISCELLANEOUS) IMPLANT
CLSR STERI-STRIP ANTIMIC 1/2X4 (GAUZE/BANDAGES/DRESSINGS) ×1 IMPLANT
COVER BACK TABLE 60X90IN (DRAPES) ×2 IMPLANT
COVER MAYO STAND STRL (DRAPES) ×2 IMPLANT
DECANTER SPIKE VIAL GLASS SM (MISCELLANEOUS) IMPLANT
DEVICE DUBIN W/COMP PLATE 8390 (MISCELLANEOUS) IMPLANT
DRAPE LAPAROTOMY 100X72 PEDS (DRAPES) ×2 IMPLANT
DRSG TEGADERM 4X4.75 (GAUZE/BANDAGES/DRESSINGS) ×2 IMPLANT
ELECT BLADE 4.0 EZ CLEAN MEGAD (MISCELLANEOUS)
ELECT COATED BLADE 2.86 ST (ELECTRODE) ×2 IMPLANT
ELECT REM PT RETURN 9FT ADLT (ELECTROSURGICAL) ×2
ELECTRODE BLDE 4.0 EZ CLN MEGD (MISCELLANEOUS) IMPLANT
ELECTRODE REM PT RTRN 9FT ADLT (ELECTROSURGICAL) ×1 IMPLANT
GLOVE BIO SURGEON STRL SZ7 (GLOVE) ×2 IMPLANT
GLOVE BIOGEL PI IND STRL 7.5 (GLOVE) ×1 IMPLANT
GLOVE BIOGEL PI INDICATOR 7.5 (GLOVE) ×1
GOWN STRL REUS W/ TWL LRG LVL3 (GOWN DISPOSABLE) ×3 IMPLANT
GOWN STRL REUS W/TWL LRG LVL3 (GOWN DISPOSABLE) ×6
ILLUMINATOR WAVEGUIDE N/F (MISCELLANEOUS) IMPLANT
KIT MARKER MARGIN INK (KITS) ×1 IMPLANT
LIGHT WAVEGUIDE WIDE FLAT (MISCELLANEOUS) IMPLANT
LIQUID BAND (GAUZE/BANDAGES/DRESSINGS) ×1 IMPLANT
MARKER SKIN DUAL TIP RULER LAB (MISCELLANEOUS) ×4 IMPLANT
NDL HYPO 25X1 1.5 SAFETY (NEEDLE) ×1 IMPLANT
NEEDLE HYPO 25X1 1.5 SAFETY (NEEDLE) ×2 IMPLANT
NS IRRIG 1000ML POUR BTL (IV SOLUTION) IMPLANT
PACK BASIN DAY SURGERY FS (CUSTOM PROCEDURE TRAY) ×2 IMPLANT
PENCIL BUTTON HOLSTER BLD 10FT (ELECTRODE) ×2 IMPLANT
SLEEVE SCD COMPRESS KNEE MED (MISCELLANEOUS) ×2 IMPLANT
SPONGE GAUZE 4X4 12PLY STER LF (GAUZE/BANDAGES/DRESSINGS) ×2 IMPLANT
SPONGE LAP 4X18 X RAY DECT (DISPOSABLE) ×2 IMPLANT
STRIP CLOSURE SKIN 1/2X4 (GAUZE/BANDAGES/DRESSINGS) IMPLANT
SUT MNCRL AB 4-0 PS2 18 (SUTURE) ×1 IMPLANT
SUT MON AB 5-0 PS2 18 (SUTURE) IMPLANT
SUT SILK 2 0 SH (SUTURE) ×2 IMPLANT
SUT VIC AB 2-0 SH 27 (SUTURE) ×2
SUT VIC AB 2-0 SH 27XBRD (SUTURE) ×1 IMPLANT
SUT VIC AB 3-0 SH 27 (SUTURE) ×2
SUT VIC AB 3-0 SH 27X BRD (SUTURE) ×1 IMPLANT
SUT VIC AB 5-0 PS2 18 (SUTURE) IMPLANT
SUT VICRYL AB 3 0 TIES (SUTURE) IMPLANT
SYR CONTROL 10ML LL (SYRINGE) ×2 IMPLANT
TOWEL OR 17X24 6PK STRL BLUE (TOWEL DISPOSABLE) ×2 IMPLANT
TOWEL OR NON WOVEN STRL DISP B (DISPOSABLE) ×2 IMPLANT
TUBE CONNECTING 20X1/4 (TUBING) IMPLANT
YANKAUER SUCT BULB TIP NO VENT (SUCTIONS) IMPLANT

## 2015-09-16 NOTE — Anesthesia Preprocedure Evaluation (Signed)
Anesthesia Evaluation  Patient identified by MRN, date of birth, ID band Patient awake    Reviewed: Allergy & Precautions, NPO status , Patient's Chart, lab work & pertinent test results  History of Anesthesia Complications (+) PONV  Airway Mallampati: I  TM Distance: >3 FB Neck ROM: Full    Dental  (+) Teeth Intact, Dental Advisory Given   Pulmonary former smoker,    breath sounds clear to auscultation       Cardiovascular  Rhythm:Regular Rate:Normal     Neuro/Psych    GI/Hepatic GERD  Medicated and Controlled,  Endo/Other  Morbid obesity  Renal/GU      Musculoskeletal   Abdominal   Peds  Hematology   Anesthesia Other Findings   Reproductive/Obstetrics                             Anesthesia Physical Anesthesia Plan  ASA: II  Anesthesia Plan: General   Post-op Pain Management:    Induction: Intravenous  Airway Management Planned: LMA  Additional Equipment:   Intra-op Plan:   Post-operative Plan: Extubation in OR  Informed Consent: I have reviewed the patients History and Physical, chart, labs and discussed the procedure including the risks, benefits and alternatives for the proposed anesthesia with the patient or authorized representative who has indicated his/her understanding and acceptance.   Dental advisory given  Plan Discussed with: CRNA, Anesthesiologist and Surgeon  Anesthesia Plan Comments:         Anesthesia Quick Evaluation

## 2015-09-16 NOTE — Interval H&P Note (Signed)
History and Physical Interval Note:  09/16/2015 7:27 AM  Krystal [name]  has presented today for surgery, with the diagnosis of LEFT BREAST CANCER  The various methods of treatment have been discussed with the patient and family. After consideration of risks, benefits and other options for treatment, the patient has consented to  Procedure(s): LEFT BREAST MASS EXCISION (Left) as a surgical intervention .  The patient's history has been reviewed, patient examined, no change in status, stable for surgery.  I have reviewed the patient's chart and labs.  Questions were answered to the patient's satisfaction.     [name]

## 2015-09-16 NOTE — Op Note (Signed)
Preoperative diagnosis: Left breast skin masses with fna c/w carcinoma, s/p mastectomy for breast cancer, history melanoma Postoperative diagnosis: same as above Procedure: left breast skin and soft tissue, mass times two resection, 7x3cm with 5 mm depth Surgeon: Dr Serita Grammes Anesthesia: general EBL: minimal Drains none Complications none Specimen left breast skin and soft tissue marked short superior, long lateral, double deep Sponge and needle count was correct to completion disposition to recovery stable  Indications: This is a 18 yof I know well from bilateral ssm with reconstruction for breast cancer. I also removed a back melanoma at same time.  She has two small masses at site of mastectomy scar. One of these underwent fna and is carcinoma. We discussed wide local excision for treatment and diagnosis.  She also has pet scan pending.   Procedure: After informed consent was obtained the patient was taken to the operating room. She was given antibiotics. Sequential compression devices were on her legs. She was then placed under general anesthesia without complication. She was prepped and draped in the standard sterile surgical fashion. A surgical timeout was then performed.  I made an ellipse that encompassed both masses with attempt to get clear margin.  I then excised this down to the implant capsule. The lateral mass was adherent to the capsule and I removed the capsule at this location. This was marked as above.  I then closed the implant capsule with 3-0 vicryl.I then closed this with 3-0 Vicryl, and 4-0 Monocryl. Glue was placed over the wound.  She tolerated this well was extubated and transferred to recovery in stable condition

## 2015-09-16 NOTE — Anesthesia Procedure Notes (Signed)
Procedure Name: LMA Insertion Date/Time: 09/16/2015 7:43 AM Performed by: Toula Moos L Pre-anesthesia Checklist: Patient identified, Emergency Drugs available, Suction available, Patient being monitored and Timeout performed Patient Re-evaluated:Patient Re-evaluated prior to inductionOxygen Delivery Method: Circle System Utilized Preoxygenation: Pre-oxygenation with 100% oxygen Intubation Type: IV induction Ventilation: Mask ventilation without difficulty LMA: LMA inserted LMA Size: 4.0 Number of attempts: 1 Airway Equipment and Method: Bite block Placement Confirmation: positive ETCO2 Tube secured with: Tape Dental Injury: Teeth and Oropharynx as per pre-operative assessment

## 2015-09-16 NOTE — H&P (Signed)
  60 yof with history of excision of back melanoma and stgage Ia breast cancer of left breast s/p bilateral ssm with reconstruction. she had been on tamoxifen and then after tah/bso she is now on anastrozole. she has undergone lipofilling. she has a couple nodules in her left breast that have been there for some time. she was seen and underwent US of these areas. there is a 1.8 cm oval lesion in the right breast likely fat necrosis. there is a 9 mm irregular mass that was suspicious as well as an 8 mm lesion at 3 oclock likely fat necrosis. an fna was done of the 9 mm area and the result is carcinoma. she reports no other complaints.   Other Problems Rolm Bookbinder, MD; 09/08/2015 9:44 AM) Melanoma Migraine Headache Unspecified Diagnosis Breast Cancer Cholelithiasis Lump In Breast  Past Surgical History Rolm Bookbinder, MD; 09/08/2015 9:44 AM) Gallbladder Surgery - Open Cesarean Section - 1 Breast Biopsy Left.  Diagnostic Studies History Rolm Bookbinder, MD; 09/08/2015 9:44 AM) Mammogram 1-3 years ago Colonoscopy 5-10 years ago  Allergies Marjean Donna, CMA; 09/08/2015 9:10 AM) Codeine Phosphate *ANALGESICS - OPIOID* Nausea, Vomiting.  Medication History Rolm Bookbinder, MD; 09/08/2015 9:44 AM) Robaxin (500MG  Tablet, Oral daily) Active. Bactrim DS (800-160MG  Tablet, Oral) Active. Docusate Sodium (100MG  Capsule, Oral) Active. Medications Reconciled Ambien (10MG  Tablet, 1 (one) Tab Oral qhs prn, Taken starting 05/26/2014) Active. (one half to one, as needed, at bedtime for sleep) OxyCODONE HCl (10MG  Tablet, Oral) Active.  Social History Rolm Bookbinder, MD; 09/08/2015 9:44 AM) No drug use Tobacco use Current some day smoker. Alcohol use Occasional alcohol use. Caffeine use Coffee, Tea.  Family History Rolm Bookbinder, MD; 09/08/2015 9:44 AM) Diabetes Mellitus Family Members In General, Father. Heart Disease Family Members In General. Arthritis  Mother. Breast Cancer Family Members In General.  Vitals Davy Pique Bynum CMA; 09/08/2015 9:10 AM) 09/08/2015 9:10 AM Weight: 256 lb Height: 64in Body Surface Area: 2.17 m Body Mass Index: 43.94 kg/m  Pulse: 81 (Regular)  BP: 136/80 (Sitting, Left Arm, Standard)       Physical Exam Rolm Bookbinder MD; 09/08/2015 11:58 AM) General Mental Status-Alert. Orientation-Oriented X3.  Chest and Lung Exam Chest and lung exam reveals -on auscultation, normal breath sounds, no adventitious sounds and normal vocal resonance.  Breast Note: two left breast masses at lateral portion of scar palpable mobile nontender    Cardiovascular Cardiovascular examination reveals -normal heart sounds, regular rate and rhythm with no murmurs.  Lymphatic Head & Neck  General Head & Neck Lymphatics: Bilateral - Description - Normal. Axillary  General Axillary Region: Bilateral - Description - Normal. Note: no Kempner adenopathy     Assessment & Plan Rolm Bookbinder MD; 09/08/2015 11:59 AM) BREAST CANCER, FEMALE (C50.919) Story: plan for wide local excision of the two masses in lateral portion of the left breast I think this is breast recurrence but due to melanoma I think excision indicated just for diagnosis as well as to excise. I also think a pet scan is indicated given cancer history and this new lesion/recurrence. we discussed surgery and recovery as well as options moving forward. she knows that radiotherapy may be indicated at least.

## 2015-09-16 NOTE — Discharge Instructions (Signed)
[facility] °Office Phone Number [phone] ° °POST OP INSTRUCTIONS ° °Always review your discharge instruction sheet given to you by the facility where your surgery was performed. ° °IF YOU HAVE DISABILITY OR FAMILY LEAVE FORMS, YOU MUST BRING THEM TO THE OFFICE FOR PROCESSING.  DO NOT GIVE THEM TO YOUR DOCTOR. ° °1. A prescription for pain medication may be given to you upon discharge.  Take your pain medication as prescribed, if needed.  If narcotic pain medicine is not needed, then you may take acetaminophen (Tylenol), naprosyn (Alleve) or ibuprofen (Advil) as needed. °2. Take your usually prescribed medications unless otherwise directed °3. If you need a refill on your pain medication, please contact your pharmacy.  They will contact our office to request authorization.  Prescriptions will not be filled after 5pm or on week-ends. °4. You should eat very light the first 24 hours after surgery, such as soup, crackers, pudding, etc.  Resume your normal diet the day after surgery. °5. Most patients will experience some swelling and bruising in the breast.  Ice packs and a good support bra will help.  Wear the breast binder provided or a sports bra for 72 hours day and night.  After that wear a sports bra during the day until you return to the office. Swelling and bruising can take several days to resolve.  °6. It is common to experience some constipation if taking pain medication after surgery.  Increasing fluid intake and taking a stool softener will usually help or prevent this problem from occurring.  A mild laxative (Milk of Magnesia or Miralax) should be taken according to package directions if there are no bowel movements after 48 hours. °7. Unless discharge instructions indicate otherwise, you may remove your bandages 48 hours after surgery and you may shower at that time.  You may have steri-strips (small skin tapes) in place directly over the incision.  These strips should be left on the  skin for 7-10 days and will come off on their own.  If your surgeon used skin glue on the incision, you may shower in 24 hours.  The glue will flake off over the next 2-3 weeks.  Any sutures or staples will be removed at the office during your follow-up visit. °8. ACTIVITIES:  You may resume regular daily activities (gradually increasing) beginning the next day.  Wearing a good support bra or sports bra minimizes pain and swelling.  You may have sexual intercourse when it is comfortable. °a. You may drive when you no longer are taking prescription pain medication, you can comfortably wear a seatbelt, and you can safely maneuver your car and apply brakes. °b. RETURN TO WORK:  ______________________________________________________________________________________ °9. You should see your doctor in the office for a follow-up appointment approximately two weeks after your surgery.  Your doctor’s nurse will typically make your follow-up appointment when she calls you with your pathology report.  Expect your pathology report 3-4 business days after your surgery.  You may call to check if you do not hear from us after three days. °10. OTHER INSTRUCTIONS: _______________________________________________________________________________________________ _____________________________________________________________________________________________________________________________________ °_____________________________________________________________________________________________________________________________________ °_____________________________________________________________________________________________________________________________________ ° °WHEN TO CALL DR WAKEFIELD: °1. Fever over 101.0 °2. Nausea and/or vomiting. °3. Extreme swelling or bruising. °4. Continued bleeding from incision. °5. Increased pain, redness, or drainage from the incision. ° °The clinic staff is available to answer your questions during regular  business hours.  Please don’t hesitate to call and ask to speak to one of the nurses for clinical concerns.  If   you have a medical emergency, go to the nearest emergency room or call 911.  A surgeon from [facility] is always on call at the hospital. ° °For further questions, please visit [email or web address] ° ° ° °Post Anesthesia Home Care Instructions ° °Activity: °Get plenty of rest for the remainder of the day. A responsible adult should stay with you for 24 hours following the procedure.  °For the next 24 hours, DO NOT: °-Drive a car °-Operate machinery °-Drink alcoholic beverages °-Take any medication unless instructed by your physician °-Make any legal decisions or sign important papers. ° °Meals: °Start with liquid foods such as gelatin or soup. Progress to regular foods as tolerated. Avoid greasy, spicy, heavy foods. If nausea and/or vomiting occur, drink only clear liquids until the nausea and/or vomiting subsides. Call your physician if vomiting continues. ° °Special Instructions/Symptoms: °Your throat may feel dry or sore from the anesthesia or the breathing tube placed in your throat during surgery. If this causes discomfort, gargle with warm salt water. The discomfort should disappear within 24 hours. ° °If you had a scopolamine patch placed behind your ear for the management of post- operative nausea and/or vomiting: ° °1. The medication in the patch is effective for 72 hours, after which it should be removed.  Wrap patch in a tissue and discard in the trash. Wash hands thoroughly with soap and water. °2. You may remove the patch earlier than 72 hours if you experience unpleasant side effects which may include dry mouth, dizziness or visual disturbances. °3. Avoid touching the patch. Wash your hands with soap and water after contact with the patch. °  ° °

## 2015-09-16 NOTE — Transfer of Care (Signed)
Immediate Anesthesia Transfer of Care Note  Patient: Krystal [name]  Procedure(s) Performed: Procedure(s): LEFT BREAST MASS EXCISION (Left)  Patient Location: PACU  Anesthesia Type:General  Level of Consciousness: awake and patient cooperative  Airway & Oxygen Therapy: Patient Spontanous Breathing and Patient connected to face mask oxygen  Post-op Assessment: Report given to RN and Post -op Vital signs reviewed and stable  Post vital signs: Reviewed and stable  Last Vitals:  Filed Vitals:   09/16/15 0640  BP: 129/82  Pulse: 91  Temp: 36.7 C  Resp: 20    Complications: No apparent anesthesia complications

## 2015-09-16 NOTE — Anesthesia Postprocedure Evaluation (Signed)
Anesthesia Post Note  Patient: Krystal [name]  Procedure(s) Performed: Procedure(s) (LRB): LEFT BREAST MASS EXCISION (Left)  Patient location during evaluation: PACU Anesthesia Type: General Level of consciousness: awake and alert Pain management: pain level controlled Vital Signs Assessment: post-procedure vital signs reviewed and stable Respiratory status: spontaneous breathing, nonlabored ventilation and respiratory function stable Cardiovascular status: blood pressure returned to baseline and stable Postop Assessment: no signs of nausea or vomiting Anesthetic complications: no    Last Vitals:  Filed Vitals:   09/16/15 0900 09/16/15 0915  BP: 121/79 132/88  Pulse: 84 85  Temp:    Resp: 13 13    Last Pain:  Filed Vitals:   09/16/15 0921  PainSc: 5                  Tawanna Funk A

## 2015-09-17 ENCOUNTER — Other Ambulatory Visit: Payer: Self-pay

## 2015-09-17 ENCOUNTER — Encounter (HOSPITAL_BASED_OUTPATIENT_CLINIC_OR_DEPARTMENT_OTHER): Payer: Self-pay | Admitting: General Surgery

## 2015-09-17 ENCOUNTER — Other Ambulatory Visit: Payer: Self-pay | Admitting: Hematology and Oncology

## 2015-09-17 NOTE — Telephone Encounter (Signed)
Chart reviewed.  Pt needs appt

## 2015-09-20 ENCOUNTER — Other Ambulatory Visit: Payer: Self-pay | Admitting: Hematology and Oncology

## 2015-09-23 ENCOUNTER — Ambulatory Visit (HOSPITAL_COMMUNITY)
Admission: RE | Admit: 2015-09-23 | Discharge: 2015-09-23 | Disposition: A | Discharge status: Home / Self Care | Payer: Commercial Managed Care - PPO | Source: Ambulatory Visit | Attending: General Surgery | Admitting: General Surgery

## 2015-09-23 DIAGNOSIS — C50112 Malignant neoplasm of central portion of left female breast: Secondary | ICD-10-CM | POA: Insufficient documentation

## 2015-09-23 DIAGNOSIS — K760 Fatty (change of) liver, not elsewhere classified: Secondary | ICD-10-CM | POA: Diagnosis not present

## 2015-09-23 DIAGNOSIS — Z9013 Acquired absence of bilateral breasts and nipples: Secondary | ICD-10-CM | POA: Diagnosis not present

## 2015-09-23 DIAGNOSIS — D3502 Benign neoplasm of left adrenal gland: Secondary | ICD-10-CM | POA: Diagnosis not present

## 2015-09-23 LAB — GLUCOSE, CAPILLARY: Glucose-Capillary: 127 mg/dL — ABNORMAL HIGH (ref 65–99)

## 2015-09-23 MED ORDER — FLUDEOXYGLUCOSE F - 18 (FDG) INJECTION
12.5200 | Freq: Once | INTRAVENOUS | Status: AC | PRN
  Administered 2015-09-23: 12.52 via INTRAVENOUS

## 2015-09-25 ENCOUNTER — Telehealth: Payer: Self-pay | Admitting: Hematology and Oncology

## 2015-09-25 NOTE — Telephone Encounter (Signed)
Left message for patient re f/u 2/27. Schedule mailed.

## 2015-10-03 NOTE — Assessment & Plan Note (Signed)
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 N0 M0 stage IA ER 100%, PR 95%, HER-2 negative ratio 1.9, Ki-67 15% with additional satellite nodule 0.6 cm: Oncotype DX recurrence score is 13 (9% risk of recurrence)  Antiestrogen therapy: Patient took tamoxifen from 05/26/2014 to 06/07/2014 and stopped it because of itching. Patient had undergone oophorectomy with TAH for reduction of risk of ovarian cancer. I discussed with her regarding the role of aromatase inhibitors. I would recommend antiestrogen therapy with anastrozole 1 mg daily. Since she just had oophorectomy early in January, I recommended that she start antiestrogen therapy sometime in April. This will enable her body to get used to menopausal state. I would like to see her in June 2016 to assess tolerability to treatment.  Breast cancer surveillance: 1. Since she had bilateral mastectomies and is no role of imaging studies. 2. Breast reconstruction: Patient has not decided reg breast reconstruction. She met with Dr. Leland Johns in December 2015.  Return to clinic in

## 2015-10-04 ENCOUNTER — Ambulatory Visit (HOSPITAL_BASED_OUTPATIENT_CLINIC_OR_DEPARTMENT_OTHER): Payer: Commercial Managed Care - PPO | Admitting: Hematology and Oncology

## 2015-10-04 ENCOUNTER — Encounter: Payer: Self-pay | Admitting: Hematology and Oncology

## 2015-10-04 VITALS — BP 144/90 | HR 99 | Temp 97.8°F | Resp 19 | Ht 64.0 in | Wt 261.5 lb

## 2015-10-04 DIAGNOSIS — Z1501 Genetic susceptibility to malignant neoplasm of breast: Secondary | ICD-10-CM

## 2015-10-04 DIAGNOSIS — C50112 Malignant neoplasm of central portion of left female breast: Secondary | ICD-10-CM

## 2015-10-04 DIAGNOSIS — Z1509 Genetic susceptibility to other malignant neoplasm: Secondary | ICD-10-CM

## 2015-10-04 DIAGNOSIS — M2550 Pain in unspecified joint: Secondary | ICD-10-CM | POA: Diagnosis not present

## 2015-10-04 NOTE — Progress Notes (Signed)
Patient Care Team: [name] as PCP - General (General Practice)  DIAGNOSIS: Cancer of central portion of left female breast (Krystal [name])   Staging form: Breast, AJCC 7th Edition     Clinical: No stage assigned - Unsigned     Pathologic: Stage IA (T1c, N0, cM0) - Signed by Rulon Eisenmenger, MD on 05/12/2014   SUMMARY OF ONCOLOGIC HISTORY:   Cancer of central portion of left female breast (Krystal [name])   04/16/2014 Surgery Bilateral mastectomies: Left breast : Multifocal invasive ductal carcinoma positive for lymphovascular invasion, 1.2 cm and 0.6 cm, grade 3, high-grade DCIS with comedonecrosis, 4 SLN negative T1 C. N0 M0 stage IA: Oncotype DX 13 (ROR 9%)   04/16/2014 Surgery Left upper back melanoma resected no residual melanoma identified on re-resection margins negative   05/26/2014 - 08/03/2015 Anti-estrogen oral therapy Tamoxifen 20 mg daily with a plan to switch her to aromatase inhibitors once she gets oophorectomy ( patient did not continue antiestrogen therapy by choice)   08/13/2014 Surgery oophorectomy with total abdominal hysterectomy   09/08/2015 -  Anti-estrogen oral therapy Resumed anastrozole after she found recurrence of breast cancer   09/16/2015 Surgery Skin, left: IDC grade 3; 0.5 cm, margins negative    CHIEF COMPLIANT: relapsed breast cancer  INTERVAL HISTORY: Krystal [name] is a 50 year old with above-mentioned history of bilateral mastectomies for left-sided multifocal invasive ductal carcinoma that was noticed on Oncotype and we recommended hormonal therapy but she was noncompliant and did not take any antiestrogen therapy. She went a full year without any treatment and felt a lump in the breast while she was undergoing breast reconstruction and fat grafting. Lap C was then performed which came back as recurrent invasive ductal carcinoma. She underwent resection on 09/16/2015 by Dr. Donne [name] and final pathology revealed that this was an ER/PR and HER-2 positive breast cancer.  Because of this she was referred back was performed discussion. She tells me that she resume taking anastrozole about a month ago after he was diagnosed with a recurrence. She does complain of arthralgias but otherwise tolerating it fairly well.  REVIEW OF SYSTEMS:   Constitutional: Denies fevers, chills or abnormal weight loss Eyes: Denies blurriness of vision Ears, nose, mouth, throat, and face: Denies mucositis or sore throat Respiratory: Denies cough, dyspnea or wheezes Cardiovascular: Denies palpitation, chest discomfort Gastrointestinal:  Denies nausea, heartburn or change in bowel habits Skin: Denies abnormal skin rashes Lymphatics: Denies new lymphadenopathy or easy bruising Neurological:Denies numbness, tingling or new weaknesses Behavioral/Psych: Mood is stable, no new changes  Extremities: No lower extremity edema Breast: status post recent resection All other systems were reviewed with the patient and are negative.  I have reviewed the past medical history, past surgical history, social history and family history with the patient and they are unchanged from previous note.  ALLERGIES:  is allergic to dilaudid and codeine.  MEDICATIONS:  Current Outpatient Prescriptions  Medication Sig Dispense Refill  . Alum Hydroxide-Mag Carbonate (GAVISCON PO) Take 2 tablets by mouth daily as needed (for heartburn).     Marland Kitchen anastrozole (ARIMIDEX) 1 MG tablet TAKE 1 TABLET (1 MG TOTAL) BY MOUTH DAILY. 30 tablet 0  . anastrozole (ARIMIDEX) 1 MG tablet Take 1 mg by mouth.    . fluocinonide cream (LIDEX) 0.05 %     . Ibuprofen-Diphenhydramine Cit (ADVIL PM PO) Take by mouth.    . oxyCODONE (OXY IR/ROXICODONE) 5 MG immediate release tablet Take 1 tablet (5 mg total) by mouth every 6 (six)  hours as needed for moderate pain, severe pain or breakthrough pain. 20 tablet 0  . permethrin (ELIMITE) 5 % cream      No current facility-administered medications for this visit.    PHYSICAL  EXAMINATION: ECOG PERFORMANCE STATUS: 1 - Symptomatic but completely ambulatory  Filed Vitals:   10/04/15 1526  BP: 144/90  Pulse: 99  Temp: 97.8 F (36.6 C)  Resp: 19   Filed Weights   10/04/15 1526  Weight: 261 lb 8 oz (118.616 kg)    GENERAL:alert, no distress and comfortable SKIN: skin color, texture, turgor are normal, no rashes or significant lesions EYES: normal, Conjunctiva are pink and non-injected, sclera clear OROPHARYNX:no exudate, no erythema and lips, buccal mucosa, and tongue normal  NECK: supple, thyroid normal size, non-tender, without nodularity LYMPH:  no palpable lymphadenopathy in the cervical, axillary or inguinal LUNGS: clear to auscultation and percussion with normal breathing effort HEART: regular rate & rhythm and no murmurs and no lower extremity edema ABDOMEN:abdomen soft, non-tender and normal bowel sounds MUSCULOSKELETAL:no cyanosis of digits and no clubbing  NEURO: alert & oriented x 3 with fluent speech, no focal motor/sensory deficits EXTREMITIES: No lower extremity edema  LABORATORY DATA:  I have reviewed the data as listed   Chemistry      Component Value Date/Time   NA 142 09/01/2014 1354   NA 140 04/14/2014 1108   K 4.3 09/01/2014 1354   K 4.9 04/14/2014 1108   CL 100 04/14/2014 1108   CO2 28 09/01/2014 1354   CO2 26 04/14/2014 1108   BUN 11.0 09/01/2014 1354   BUN 9 04/14/2014 1108   CREATININE 0.8 09/01/2014 1354   CREATININE 0.70 04/14/2014 1108      Component Value Date/Time   CALCIUM 8.9 09/01/2014 1354   CALCIUM 9.0 04/14/2014 1108   ALKPHOS 82 09/01/2014 1354   ALKPHOS 68 04/14/2014 1108   AST 14 09/01/2014 1354   AST 14 04/14/2014 1108   ALT 14 09/01/2014 1354   ALT 15 04/14/2014 1108   BILITOT 0.48 09/01/2014 1354   BILITOT 0.2* 04/14/2014 1108       Lab Results  Component Value Date   WBC 6.5 09/01/2014   HGB 11.3* 09/01/2014   HCT 36.1 09/01/2014   MCV 84.1 09/01/2014   PLT 411* 09/01/2014   NEUTROABS  4.3 09/01/2014     ASSESSMENT & PLAN:  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 N0 M0 stage IA ER 100%, PR 95%, HER-2 negative ratio 1.9, Ki-67 15% with additional satellite nodule 0.6 cm: Oncotype DX recurrence score is 13 (9% risk of recurrence)  Antiestrogen therapy: Patient took tamoxifen from 05/26/2014 to 06/07/2014 and stopped it because of itching. Patient had undergone oophorectomy with TAH for reduction of risk of ovarian cancer. She never resumed anastrozole. She decided on her own to not take antiestrogen therapy at that time.  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 Radiation oncology consultation ( patient is very concerned about the role of radiation as she believes that the radiation might contract her implants.) 2. I recommended Herceptin plus anastrozole therapy. 3. If she needs radiation, we will hold anastrozole until she completes radiation but will continue with Herceptin. Given the small size of the tumor and borderline HER-2 positivity, I did not  think she would need chemotherapy in addition to Herceptin. I believe the anastrozole in combination with Herceptin would be fairly effective. Patient understands this is slightly out of the box approach but given the small size of the tumor I believe this regimen would be the most appropriate.  Patient was very emotional and was glad that she may not need chemotherapy and could get away with Herceptin alone.  I discussed the pros and cons of Herceptin including the risk of cardiomyopathy and decline in ejection fraction. We will discuss her case at tumor board and, but the consensus decision. I will request for port placement from Dr. Donne [name].   No orders of the defined types  were placed in this encounter.   The patient has a good understanding of the overall plan. she agrees with it. she will call with any problems that may develop before the next visit here.   Rulon Eisenmenger, MD 10/04/2015

## 2015-10-04 NOTE — Progress Notes (Signed)
Location of Breast Cancer: Left breast cancer  Histology per Pathology Report:  09/16/15 Diagnosis Skin , Left INVASIVE DUCTAL CARCINOMA, GRADE 3 (0.5 CM) MARGINS OF RESECTION ARE NEGATIVE THE TUMOR IS 0.1 MM FROM THE CLOSEST 6 O'CLOCK MARGINS  03/05/14 Diagnosis Breast, left, needle core biopsy, mass - INVASIVE DUCTAL CARCINOMA, SEE COMMENT.  04/16/14 Diagnosis 1. Skin , left upper back - NO RESIDUAL MALIGNANT MELANOMA IDENTIFIED, SEE COMMENT. - EXCISIONAL MARGINS, NEGATIVE FOR TUMOR. 2. Breast, simple mastectomy, Right - BENIGN BREAST TISSUE, SEE COMMENT. - NEGATIVE FOR ATYPIA OR MALIGNANCY. - SURGICAL MARGINS, NEGATIVE FOR ATYPIA OR MALIGNANCY. 3. Breast, simple mastectomy, Left - MULTIFOCAL INVASIVE DUCTAL CARCINOMA, SEE COMMENT. - POSITIVE FOR LYMPH VASCULAR INVASION. - INVASIVE TUMOR IS 1.2 CM FROM NEAREST MARGIN (ANTERIOR). 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 1- PREVIOUS BIOPSY SITE. - SEE TUMOR SYNOPTIC TEMPLATE BELOW. 4. Lymph node, sentinel, biopsy, Left axillary #1 - ONE LYMPH NODE, NEGATIVE FOR TUMOR (0/1). 5. Lymph node, sentinel, biopsy, Left axillary #2 - ONE LYMPH NODE, NEGATIVE FOR TUMOR (0/1). 6. Lymph node, sentinel, biopsy, Left axillary #3 - ONE LYMPH NODE, NEGATIVE FOR TUMOR (0/1). 7. Lymph node, sentinel, biopsy, Left axillary #4 - ONE LYMPH NODE, NEGATIVE FOR TUMOR (0/1).  Receptor Status: ER(100% +), PR (95% +), Her2-neu (negative), biopsy from 09/16/15 shows ER 95%, PR 10% Her2-neu positive   Did patient present with symptoms (if so, please note symptoms) or was this found on screening mammography?: She was doing a breast exam and found a left breast mass.  Past/Anticipated interventions by surgeon, if any: 04/16/14 -Procedure: BILATERAL SKIN SPARING MASTECTOMIES WITH LEFT AXILLARY SENTINEL NODE BIOPSY; Surgeon: Rolm Bookbinder, MD; Location: [place]; Service: General; Laterality: Bilateral;  Procedure: BILATERAL BREAST RECONSTRUCTION WITH PLACEMENT OF TISSUE EXPANDER AND FLEX HD (ACELLULAR HYDRATED DERMIS); Surgeon: Irene Limbo, MD; Location: [place]; Service: Plastics; Laterality: Bilateral; 11/13/14 - Procedure: REMOVAL OF BILATERAL TISSUE EXPANDERS WITH PLACEMENT OF BILATERAL SILICONE IMPLANTS ;  Surgeon: Irene Limbo, MD;  Location: [facility];  Service: Plastics;  Laterality: Bilateral;Procedure: LIPOFILLING TO BILATERAL CHEST ;  Surgeon: Irene Limbo, MD;  Location: [place];  Service: Plastics;  Laterality: Bilateral; 09/16/15 - Procedure: LEFT BREAST MASS EXCISION;  Surgeon: Rolm Bookbinder, MD;  Location: [place];  Service: General;  Laterality: Left;    Past/Anticipated interventions by medical oncology, if any: taking anastrazole  Lymphedema issues, if any: no  Pain issues, if any: no  SAFETY ISSUES:  Prior radiation? no  Pacemaker/ICD? no  Possible current pregnancy?no  Is the patient on methotrexate? no  Current Complaints / other details: Patient had melanoma in her upper midback that was removed on 04/16/14. She is currently taking anastrazole.  She is here with her boyfriend.  BP 130/85 mmHg  Pulse 98  Temp(Src) 98.6 F (37 C) (Oral)  Ht _0  (1.626 m)  Wt 262 lb 4.8 oz (118.978 kg)  BMI 45.00 kg/m2  LMP 07/06/2014 (Within Weeks)

## 2015-10-07 ENCOUNTER — Encounter: Payer: Self-pay | Admitting: Radiation Oncology

## 2015-10-07 ENCOUNTER — Ambulatory Visit
Admission: RE | Admit: 2015-10-07 | Discharge: 2015-10-07 | Disposition: A | Discharge status: Home / Self Care | Payer: Commercial Managed Care - PPO | Source: Ambulatory Visit | Attending: Radiation Oncology | Admitting: Radiation Oncology

## 2015-10-07 VITALS — BP 130/85 | HR 98 | Temp 98.6°F | Ht 64.0 in | Wt 262.3 lb

## 2015-10-07 DIAGNOSIS — Z170 Estrogen receptor positive status [ER+]: Secondary | ICD-10-CM | POA: Diagnosis not present

## 2015-10-07 DIAGNOSIS — Z1501 Genetic susceptibility to malignant neoplasm of breast: Secondary | ICD-10-CM

## 2015-10-07 DIAGNOSIS — C50112 Malignant neoplasm of central portion of left female breast: Secondary | ICD-10-CM | POA: Diagnosis present

## 2015-10-07 DIAGNOSIS — Z510 Encounter for antineoplastic radiation therapy: Secondary | ICD-10-CM | POA: Diagnosis not present

## 2015-10-07 DIAGNOSIS — Z1509 Genetic susceptibility to other malignant neoplasm: Secondary | ICD-10-CM

## 2015-10-07 NOTE — Progress Notes (Signed)
Radiation Oncology         [phone] ________________________________  Name: Krystal [name] MRN: [id number]  Date: 10/07/2015  DOB: [birth year]  Reevaluation Note  CC: [name]  Rolm Bookbinder, MD    ICD-9-CM ICD-10-CM   1. Cancer of central portion of left female breast (HCC) 174.1 C50.112   2. 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 Previously diagnosed with multifocal left breast invasive ductal carcinoma s/p bilateral mastectomies. )  Narrative: Krystal [name] is a 50 year old with above-mentioned history of bilateral mastectomies for left-sided multifocal invasive ductal carcinoma. She was recommended to hormonal therapy but she was noncompliant and did not take  antiestrogen therapy due to significant side effects with this therapy.  She went a full year without any treatment and felt a lump in the breast while she was undergoing breast reconstruction and fat grafting. Aspiration was then performed which came back as recurrent invasive ductal carcinoma. She underwent resection on 09/16/2015 by Dr. Donne Hazel and final pathology revealed that this was an ER/PR and HER-2 positive breast cancer. Surgical margins were clear but extremely close.  Dr.Gudena has recommended Herceptin and anastrozole therapy. Anastrozole would be held until completion of radiation treatment.                               ALLERGIES:  is allergic to dilaudid and codeine.  Meds: Current Outpatient Prescriptions  Medication Sig Dispense Refill  . Alum Hydroxide-Mag Carbonate (GAVISCON PO) Take 2 tablets by mouth daily as needed (for heartburn).     Marland Kitchen anastrozole (ARIMIDEX) 1 MG tablet TAKE 1 TABLET (1 MG TOTAL) BY MOUTH DAILY. 30  tablet 0  . Ibuprofen-Diphenhydramine Cit (ADVIL PM PO) Take by mouth.    Marland Kitchen anastrozole (ARIMIDEX) 1 MG tablet Take 1 mg by mouth.    . fluocinonide cream (LIDEX) 0.05 % Reported on 10/07/2015    . oxyCODONE (OXY IR/ROXICODONE) 5 MG immediate release tablet Take 1 tablet (5 mg total) by mouth every 6 (six) hours as needed for moderate pain, severe pain or breakthrough pain. (Patient not taking: Reported on 10/07/2015) 20 tablet 0  . permethrin (ELIMITE) 5 % cream Reported on 10/07/2015     No current facility-administered medications for this encounter.    Physical Findings: The patient is in no acute distress. Patient is alert and oriented.  height is 5' 4"  (1.626 m) and weight is 262 lb 4.8 oz (118.978 kg). Her oral temperature is 98.6 F (37 C). Her blood pressure is 130/85 and her pulse is 98. Marland Kitchen   No palpable cervical, supraclavicular or axillary lymphadenopathy. The heart has a regular rate and rhythm. The lungs are clear to auscultation. Bilateral implants in place. Mastectomy scars in both chest areas. Separate scar in the left breast from recent excisional biopsy, healing well, no signs of infection. Along superior edge of patient's left implant she has a 5 x 5 mm nodule somewhat concerning for fat necrosis or tumor.    Lab Findings: Lab Results  Component Value Date   WBC 6.5 09/01/2014   HGB 11.3* 09/01/2014   HCT 36.1 09/01/2014   MCV 84.1 09/01/2014   PLT 411*  09/01/2014    Radiographic Findings: Nm Pet Image Initial (pi) Whole Body  09/23/2015  CLINICAL DATA:  Initial treatment strategy for left upper back melanoma resected 02/17/2014 and left breast multifocal invasive ductal carcinoma status post bilateral mastectomy on 04/16/2014, with left chest wall carcinoma recurrence on 09/16/2015. EXAM: NUCLEAR MEDICINE PET WHOLE BODY TECHNIQUE: 12.5 mCi F-18 FDG was injected intravenously. Full-ring PET imaging was performed from the vertex to the feet after the radiotracer. CT data was  obtained and used for attenuation correction and anatomic localization. FASTING BLOOD GLUCOSE:  Value:  127 mg/dl COMPARISON:  08/19/2011 CT abdomen/pelvis. FINDINGS: Head/Neck: No hypermetabolic lymph nodes in the neck. Symmetric hypermetabolism in the palatine tonsils and posterior nasopharynx is likely physiologic. Chest: There is relatively symmetric curvilinear low level metabolism throughout the diffusely mildly thickened skin of the reconstructed bilateral breasts, max SUV 4.3 on the right and max SUV 3.9 on the left, most in keeping with low-level inflammatory activity related to prior reconstruction. Bilateral breast prostheses appear intact. No focal hypermetabolism in the chest wall. There is low level linear metabolism within the skin in the medial left upper back at the site of the melanoma resection scar, in keeping with low level inflammatory activity. No focal hypermetabolism to suggest recurrent melanoma. No hypermetabolic axillary, mediastinal or hilar nodes. No acute consolidative airspace disease or significant pulmonary nodules. Abdomen/Pelvis: No abnormal hypermetabolic activity within the liver, pancreas, adrenal glands, or spleen. No hypermetabolic lymph nodes in the abdomen or pelvis. Diffuse hepatic steatosis. Cholecystectomy. Left adrenal 1.2 cm adenoma, non hypermetabolic with density -5 HU. Mild diffuse colonic diverticulosis. Hysterectomy. Skeleton: No focal hypermetabolic activity to suggest skeletal metastasis. Extremities: No hypermetabolic activity to suggest metastasis. IMPRESSION: 1. No evidence of hypermetabolic metastatic disease. 2. Relatively symmetric curvilinear low level metabolism throughout the diffusely mildly thickened scan of the reconstructed bilateral breasts, nonspecific and most in keeping with low level inflammatory activity related to reconstruction. 3. Low level linear metabolism at the resection scar in the medial left upper back, most in keeping with low level  inflammatory activity related to the scar. 4. Additional findings include diffuse hepatic steatosis and left adrenal adenoma. Electronically Signed   By: Ilona Sorrel M.D.   On: 09/23/2015 09:51   Impression:  The patient has been diagnosed with recurrence of IDC in the left breast. She has a close surgical margin. We discussed options for management including comprehensive radiation to the chest and supraclavicular area vs local radiation directed at the site of recurrence. The patient is hesitant to consider radiation therapy given the potential effects on her reconstruction. Dr. Lindi Adie has recommended Herceptin and anastrozole therapy. On today's physical exam I palpated a previously undocumented nodule on top of the left breast implant. We will order an ultrasound for further workup of the suspicious nodule, I have contacted Dr.Wakefield and [facility] about this. Her case will be presented at next week's breast conference.   Plan: We discussed the possible side effects and risks of treatment in addition to the possible benefits of treatment. We discussed the protocol for radiation treatment.  All of the patient's questions were answered.   We will proceed with ultrasound of the left chest for workup of the suspicious nodule. Final details are pending at this time.  Treatment plans pending at this time.  -----------------------------------  Blair Promise, PhD, MD  This document serves as a record of services personally performed by [name], MD. It was created on his behalf by Derek Mound, a  trained medical scribe. The creation of this record is based on the scribe's personal observations and the provider's statements to them. This document has been checked and approved by the attending provider.

## 2015-10-07 NOTE — Progress Notes (Signed)
Please see the Nurse Progress Note in the MD Initial Consult Encounter for this patient. 

## 2015-10-11 ENCOUNTER — Other Ambulatory Visit: Payer: Self-pay | Admitting: General Surgery

## 2015-10-13 ENCOUNTER — Other Ambulatory Visit: Payer: Self-pay | Admitting: *Deleted

## 2015-10-13 DIAGNOSIS — C50112 Malignant neoplasm of central portion of left female breast: Secondary | ICD-10-CM

## 2015-10-14 ENCOUNTER — Telehealth (HOSPITAL_COMMUNITY): Payer: Self-pay | Admitting: Vascular Surgery

## 2015-10-14 NOTE — Telephone Encounter (Signed)
Left pt message to make new pt appt w/ ECHO  

## 2015-10-15 ENCOUNTER — Telehealth: Payer: Self-pay | Admitting: *Deleted

## 2015-10-15 ENCOUNTER — Ambulatory Visit (HOSPITAL_BASED_OUTPATIENT_CLINIC_OR_DEPARTMENT_OTHER): Admit: 2015-10-15 | Payer: 59 | Admitting: Plastic Surgery

## 2015-10-15 ENCOUNTER — Telehealth: Payer: Self-pay | Admitting: Hematology and Oncology

## 2015-10-15 ENCOUNTER — Encounter (HOSPITAL_BASED_OUTPATIENT_CLINIC_OR_DEPARTMENT_OTHER): Payer: Self-pay

## 2015-10-15 SURGERY — LIPOSUCTION, WITH FAT TRANSFER
Anesthesia: General | Laterality: Bilateral

## 2015-10-15 NOTE — Telephone Encounter (Signed)
s.w. pt and advised on March appt....pt ok and aware °

## 2015-10-15 NOTE — Telephone Encounter (Signed)
Per staff message and POF I have scheduled appts. Advised scheduler of appts. JMW  

## 2015-10-18 ENCOUNTER — Encounter (HOSPITAL_COMMUNITY): Payer: Self-pay | Admitting: *Deleted

## 2015-10-21 ENCOUNTER — Encounter (HOSPITAL_BASED_OUTPATIENT_CLINIC_OR_DEPARTMENT_OTHER)
Admission: RE | Disposition: A | Discharge status: Home / Self Care | Payer: Self-pay | Source: Ambulatory Visit | Attending: General Surgery

## 2015-10-21 ENCOUNTER — Ambulatory Visit (HOSPITAL_BASED_OUTPATIENT_CLINIC_OR_DEPARTMENT_OTHER): Payer: Commercial Managed Care - PPO | Admitting: Anesthesiology

## 2015-10-21 ENCOUNTER — Ambulatory Visit (HOSPITAL_COMMUNITY): Payer: Commercial Managed Care - PPO

## 2015-10-21 ENCOUNTER — Encounter (HOSPITAL_BASED_OUTPATIENT_CLINIC_OR_DEPARTMENT_OTHER): Payer: Self-pay | Admitting: *Deleted

## 2015-10-21 ENCOUNTER — Ambulatory Visit (HOSPITAL_COMMUNITY)
Admission: RE | Admit: 2015-10-21 | Discharge: 2015-10-21 | Disposition: A | Discharge status: Home / Self Care | Payer: Commercial Managed Care - PPO | Source: Ambulatory Visit | Attending: General Surgery | Admitting: General Surgery

## 2015-10-21 DIAGNOSIS — Z85820 Personal history of malignant melanoma of skin: Secondary | ICD-10-CM | POA: Diagnosis not present

## 2015-10-21 DIAGNOSIS — Z79811 Long term (current) use of aromatase inhibitors: Secondary | ICD-10-CM | POA: Diagnosis not present

## 2015-10-21 DIAGNOSIS — Z6841 Body Mass Index (BMI) 40.0 and over, adult: Secondary | ICD-10-CM | POA: Insufficient documentation

## 2015-10-21 DIAGNOSIS — C50912 Malignant neoplasm of unspecified site of left female breast: Secondary | ICD-10-CM | POA: Diagnosis not present

## 2015-10-21 DIAGNOSIS — K219 Gastro-esophageal reflux disease without esophagitis: Secondary | ICD-10-CM | POA: Diagnosis not present

## 2015-10-21 DIAGNOSIS — Z87891 Personal history of nicotine dependence: Secondary | ICD-10-CM | POA: Insufficient documentation

## 2015-10-21 DIAGNOSIS — Z79899 Other long term (current) drug therapy: Secondary | ICD-10-CM | POA: Insufficient documentation

## 2015-10-21 DIAGNOSIS — C50919 Malignant neoplasm of unspecified site of unspecified female breast: Secondary | ICD-10-CM | POA: Diagnosis present

## 2015-10-21 DIAGNOSIS — Z95828 Presence of other vascular implants and grafts: Secondary | ICD-10-CM

## 2015-10-21 HISTORY — PX: PORTACATH PLACEMENT: SHX2246

## 2015-10-21 SURGERY — INSERTION, TUNNELED CENTRAL VENOUS DEVICE, WITH PORT
Anesthesia: General | Site: Chest | Laterality: Right

## 2015-10-21 MED ORDER — HYDROMORPHONE HCL 1 MG/ML IJ SOLN: 0.2500 mg | INTRAMUSCULAR | Status: DC | PRN

## 2015-10-21 MED ORDER — GLYCOPYRROLATE 0.2 MG/ML IJ SOLN: 0.2000 mg | Freq: Once | INTRAMUSCULAR | Status: DC | PRN

## 2015-10-21 MED ORDER — PROPOFOL 500 MG/50ML IV EMUL
INTRAVENOUS | Status: AC
  Filled 2015-10-21: qty 50

## 2015-10-21 MED ORDER — OXYCODONE HCL 5 MG PO TABS
5.0000 mg | ORAL_TABLET | Freq: Once | ORAL | Status: AC | PRN
  Administered 2015-10-21: 5 mg via ORAL

## 2015-10-21 MED ORDER — HEPARIN (PORCINE) IN NACL 2-0.9 UNIT/ML-% IJ SOLN
INTRAMUSCULAR | Status: DC | PRN
  Administered 2015-10-21: 500 mL via INTRAVENOUS

## 2015-10-21 MED ORDER — OXYCODONE HCL 5 MG/5ML PO SOLN: 5.0000 mg | Freq: Once | ORAL | Status: AC | PRN

## 2015-10-21 MED ORDER — FENTANYL CITRATE (PF) 100 MCG/2ML IJ SOLN
INTRAMUSCULAR | Status: AC
  Filled 2015-10-21: qty 2

## 2015-10-21 MED ORDER — MIDAZOLAM HCL 2 MG/2ML IJ SOLN
1.0000 mg | INTRAMUSCULAR | Status: DC | PRN
  Administered 2015-10-21 (×2): 2 mg via INTRAVENOUS

## 2015-10-21 MED ORDER — DEXAMETHASONE SODIUM PHOSPHATE 10 MG/ML IJ SOLN
INTRAMUSCULAR | Status: AC
  Filled 2015-10-21: qty 1

## 2015-10-21 MED ORDER — FENTANYL CITRATE (PF) 100 MCG/2ML IJ SOLN
50.0000 ug | INTRAMUSCULAR | Status: DC | PRN
  Administered 2015-10-21 (×2): 50 ug via INTRAVENOUS

## 2015-10-21 MED ORDER — LIDOCAINE HCL (CARDIAC) 20 MG/ML IV SOLN
INTRAVENOUS | Status: DC | PRN
  Administered 2015-10-21: 80 mg via INTRAVENOUS

## 2015-10-21 MED ORDER — BUPIVACAINE HCL (PF) 0.25 % IJ SOLN
INTRAMUSCULAR | Status: DC | PRN
  Administered 2015-10-21: 9 mL

## 2015-10-21 MED ORDER — DEXAMETHASONE SODIUM PHOSPHATE 4 MG/ML IJ SOLN
INTRAMUSCULAR | Status: DC | PRN
  Administered 2015-10-21: 10 mg via INTRAVENOUS

## 2015-10-21 MED ORDER — OXYCODONE HCL 5 MG PO TABS: 5.0000 mg | ORAL_TABLET | Freq: Four times a day (QID) | ORAL | Status: DC | PRN

## 2015-10-21 MED ORDER — ONDANSETRON HCL 4 MG/2ML IJ SOLN
INTRAMUSCULAR | Status: AC
  Filled 2015-10-21: qty 2

## 2015-10-21 MED ORDER — CEFAZOLIN SODIUM-DEXTROSE 2-3 GM-% IV SOLR
2.0000 g | INTRAVENOUS | Status: AC
  Administered 2015-10-21: 2 g via INTRAVENOUS

## 2015-10-21 MED ORDER — MEPERIDINE HCL 25 MG/ML IJ SOLN: 6.2500 mg | INTRAMUSCULAR | Status: DC | PRN

## 2015-10-21 MED ORDER — HEPARIN SOD (PORK) LOCK FLUSH 100 UNIT/ML IV SOLN
INTRAVENOUS | Status: DC | PRN
  Administered 2015-10-21: 500 [IU] via INTRAVENOUS

## 2015-10-21 MED ORDER — LIDOCAINE HCL (CARDIAC) 20 MG/ML IV SOLN
INTRAVENOUS | Status: AC
  Filled 2015-10-21: qty 5

## 2015-10-21 MED ORDER — MIDAZOLAM HCL 2 MG/2ML IJ SOLN
INTRAMUSCULAR | Status: AC
  Filled 2015-10-21: qty 2

## 2015-10-21 MED ORDER — PROPOFOL 10 MG/ML IV BOLUS
INTRAVENOUS | Status: DC | PRN
  Administered 2015-10-21: 300 mg via INTRAVENOUS

## 2015-10-21 MED ORDER — LACTATED RINGERS IV SOLN
INTRAVENOUS | Status: DC
  Administered 2015-10-21: 10 mL/h via INTRAVENOUS

## 2015-10-21 MED ORDER — ONDANSETRON HCL 4 MG/2ML IJ SOLN
INTRAMUSCULAR | Status: DC | PRN
  Administered 2015-10-21: 4 mg via INTRAVENOUS

## 2015-10-21 MED ORDER — SCOPOLAMINE 1 MG/3DAYS TD PT72
1.0000 | MEDICATED_PATCH | Freq: Once | TRANSDERMAL | Status: DC | PRN
  Administered 2015-10-21: 1.5 mg via TRANSDERMAL

## 2015-10-21 MED ORDER — OXYCODONE HCL 5 MG PO TABS
ORAL_TABLET | ORAL | Status: AC
  Filled 2015-10-21: qty 1

## 2015-10-21 MED ORDER — SCOPOLAMINE 1 MG/3DAYS TD PT72
MEDICATED_PATCH | TRANSDERMAL | Status: AC
  Filled 2015-10-21: qty 1

## 2015-10-21 MED ORDER — CEFAZOLIN SODIUM-DEXTROSE 2-3 GM-% IV SOLR
INTRAVENOUS | Status: AC
  Filled 2015-10-21: qty 50

## 2015-10-21 SURGICAL SUPPLY — 49 items
APL SKNCLS STERI-STRIP NONHPOA (GAUZE/BANDAGES/DRESSINGS) ×1
BAG DECANTER FOR FLEXI CONT (MISCELLANEOUS) ×2 IMPLANT
BENZOIN TINCTURE PRP APPL 2/3 (GAUZE/BANDAGES/DRESSINGS) ×2 IMPLANT
BLADE SURG 11 STRL SS (BLADE) ×2 IMPLANT
BLADE SURG 15 STRL LF DISP TIS (BLADE) ×1 IMPLANT
BLADE SURG 15 STRL SS (BLADE) ×2
CANISTER SUCT 1200ML W/VALVE (MISCELLANEOUS) IMPLANT
CHLORAPREP W/TINT 26ML (MISCELLANEOUS) ×2 IMPLANT
COVER BACK TABLE 60X90IN (DRAPES) ×2 IMPLANT
COVER MAYO STAND STRL (DRAPES) ×2 IMPLANT
COVER PROBE 5X48 (MISCELLANEOUS)
DECANTER SPIKE VIAL GLASS SM (MISCELLANEOUS) ×1 IMPLANT
DRAPE C-ARM 42X72 X-RAY (DRAPES) ×2 IMPLANT
DRAPE LAPAROSCOPIC ABDOMINAL (DRAPES) ×2 IMPLANT
DRAPE UTILITY XL STRL (DRAPES) ×2 IMPLANT
DRSG TEGADERM 4X4.75 (GAUZE/BANDAGES/DRESSINGS) IMPLANT
ELECT COATED BLADE 2.86 ST (ELECTRODE) ×2 IMPLANT
ELECT REM PT RETURN 9FT ADLT (ELECTROSURGICAL) ×2
ELECTRODE REM PT RTRN 9FT ADLT (ELECTROSURGICAL) ×1 IMPLANT
GLOVE BIO SURGEON STRL SZ7 (GLOVE) ×2 IMPLANT
GLOVE BIOGEL PI IND STRL 7.5 (GLOVE) ×1 IMPLANT
GLOVE BIOGEL PI INDICATOR 7.5 (GLOVE) ×1
GOWN STRL REUS W/ TWL LRG LVL3 (GOWN DISPOSABLE) ×2 IMPLANT
GOWN STRL REUS W/TWL LRG LVL3 (GOWN DISPOSABLE) ×6
IV KIT MINILOC 20X1 SAFETY (NEEDLE) IMPLANT
KIT CVR 48X5XPRB PLUP LF (MISCELLANEOUS) IMPLANT
KIT PORT POWER 8FR ISP CVUE (Catheter) ×1 IMPLANT
LIQUID BAND (GAUZE/BANDAGES/DRESSINGS) ×3 IMPLANT
NDL HYPO 25X1 1.5 SAFETY (NEEDLE) ×1 IMPLANT
NDL SAFETY ECLIPSE 18X1.5 (NEEDLE) IMPLANT
NEEDLE HYPO 18GX1.5 SHARP (NEEDLE)
NEEDLE HYPO 25X1 1.5 SAFETY (NEEDLE) ×2 IMPLANT
PACK BASIN DAY SURGERY FS (CUSTOM PROCEDURE TRAY) ×2 IMPLANT
PENCIL BUTTON HOLSTER BLD 10FT (ELECTRODE) ×2 IMPLANT
SLEEVE SCD COMPRESS KNEE MED (MISCELLANEOUS) ×2 IMPLANT
SPONGE GAUZE 4X4 12PLY STER LF (GAUZE/BANDAGES/DRESSINGS) ×2 IMPLANT
STRIP CLOSURE SKIN 1/2X4 (GAUZE/BANDAGES/DRESSINGS) ×2 IMPLANT
SUT MON AB 4-0 PC3 18 (SUTURE) ×2 IMPLANT
SUT PROLENE 2 0 SH DA (SUTURE) ×2 IMPLANT
SUT SILK 2 0 TIES 17X18 (SUTURE)
SUT SILK 2-0 18XBRD TIE BLK (SUTURE) IMPLANT
SUT VIC AB 3-0 SH 27 (SUTURE) ×2
SUT VIC AB 3-0 SH 27X BRD (SUTURE) ×1 IMPLANT
SYR 5ML LUER SLIP (SYRINGE) ×2 IMPLANT
SYR CONTROL 10ML LL (SYRINGE) ×2 IMPLANT
TOWEL OR 17X24 6PK STRL BLUE (TOWEL DISPOSABLE) ×2 IMPLANT
TOWEL OR NON WOVEN STRL DISP B (DISPOSABLE) ×2 IMPLANT
TUBE CONNECTING 20X1/4 (TUBING) IMPLANT
YANKAUER SUCT BULB TIP NO VENT (SUCTIONS) IMPLANT

## 2015-10-21 NOTE — Transfer of Care (Signed)
Immediate Anesthesia Transfer of Care Note  Patient: Krystal [name]  Procedure(s) Performed: Procedure(s): INSERTION PORT-A-CATH WITH ULTRASOUND  (Right)  Patient Location: PACU  Anesthesia Type:General  Level of Consciousness: awake and sedated  Airway & Oxygen Therapy: Patient Spontanous Breathing and Patient connected to face mask oxygen  Post-op Assessment: Report given to RN and Post -op Vital signs reviewed and stable  Post vital signs: Reviewed and stable  Last Vitals:  Filed Vitals:   10/21/15 0653 10/21/15 0830  BP: 137/88 132/82  Pulse: 91   Temp: 36.8 C   Resp: 18     Complications: No apparent anesthesia complications

## 2015-10-21 NOTE — Anesthesia Procedure Notes (Signed)
Procedure Name: LMA Insertion Performed by: Bera Pinela W Pre-anesthesia Checklist: Patient identified, Emergency Drugs available, Suction available and Patient being monitored Patient Re-evaluated:Patient Re-evaluated prior to inductionOxygen Delivery Method: Circle System Utilized Preoxygenation: Pre-oxygenation with 100% oxygen Intubation Type: IV induction Ventilation: Mask ventilation without difficulty LMA: LMA inserted LMA Size: 4.0 Number of attempts: 1 Airway Equipment and Method: bite block Placement Confirmation: positive ETCO2 Tube secured with: Tape Dental Injury: Teeth and Oropharynx as per pre-operative assessment      

## 2015-10-21 NOTE — Discharge Instructions (Signed)

## 2015-10-21 NOTE — H&P (Signed)
  89 yof with history of excision of back melanoma and stgage Ia breast cancer of left breast s/p bilateral ssm with reconstruction. she had been on tamoxifen and then after tah/bso she is now on anastrozole. she has undergone lipofilling. she has a couple nodules in her left breast that have been there for some time. she was seen and underwent US of these areas. there is a 1.8 cm oval lesion in the right breast likely fat necrosis. there is a 9 mm irregular mass that was suspicious as well as an 8 mm lesion at 3 oclock likely fat necrosis. an fna was done of the 9 mm area and the result is carcinoma. she reports no other complaints. This has been excised.  She will get chemo/herceptin.   Other Problems Melanoma Migraine Headache Unspecified Diagnosis Breast Cancer Cholelithiasis Lump In Breast  Past Surgical History  Gallbladder Surgery - Open Cesarean Section - 1 Breast Biopsy Left.  Diagnostic Studies History  Mammogram 1-3 years ago Colonoscopy 5-10 years ago  Allergies  Codeine Phosphate *ANALGESICS - OPIOID* Nausea, Vomiting.  Medication History  Robaxin (500MG  Tablet, Oral daily) Active. Bactrim DS (800-160MG  Tablet, Oral) Active. Docusate Sodium (100MG  Capsule, Oral) Active. Medications Reconciled Ambien (10MG  Tablet, 1 (one) Tab Oral qhs prn, Taken starting 05/26/2014) Active. (one half to one, as needed, at bedtime for sleep) OxyCODONE HCl (10MG  Tablet, Oral) Active.  Social History  No drug use Tobacco use Current some day smoker. Alcohol use Occasional alcohol use. Caffeine use Coffee, Tea.  Family History  Diabetes Mellitus Family Members In General, Father. Heart Disease Family Members In General. Arthritis Mother. Breast Cancer Family Members In General.  Vitals  Weight: 256 lb Height: 64in Body Surface Area: 2.17 m Body Mass Index: 43.94 kg/m  Pulse: 81 (Regular)  BP: 136/80 (Sitting, Left Arm,  Standard)  Physical Exam  General Mental Status-Alert. Orientation-Oriented X3. Chest and Lung Exam Chest and lung exam reveals -on auscultation, normal breath sounds, no adventitious sounds and normal vocal resonance. Breast Note: healing scar Cardiovascular Cardiovascular examination reveals -normal heart sounds, regular rate and rhythm with no murmurs. Lymphatic Head & Neck General Head & Neck Lymphatics: Bilateral - Description - Normal. Axillary General Axillary Region: Bilateral - Description - Normal. Note: no [place] adenopathy   Assessment & Plan  Breast cancer Port placement

## 2015-10-21 NOTE — Anesthesia Postprocedure Evaluation (Signed)
Anesthesia Post Note  Patient: Cloria Foist  Procedure(s) Performed: Procedure(s) (LRB): INSERTION PORT-A-CATH WITH ULTRASOUND  (Right)  Patient location during evaluation: PACU Anesthesia Type: General Level of consciousness: awake and alert Pain management: pain level controlled Vital Signs Assessment: post-procedure vital signs reviewed and stable Respiratory status: spontaneous breathing, nonlabored ventilation and respiratory function stable Cardiovascular status: blood pressure returned to baseline and stable Postop Assessment: no signs of nausea or vomiting Anesthetic complications: no    Last Vitals:  Filed Vitals:   10/21/15 0930 10/21/15 0945  BP: 140/98 137/94  Pulse: 84 82  Temp:    Resp: 16 16    Last Pain:  Filed Vitals:   10/21/15 0949  PainSc: 3                  Breiana Stratmann A

## 2015-10-21 NOTE — Anesthesia Preprocedure Evaluation (Addendum)
Anesthesia Evaluation  Patient identified by MRN, date of birth, ID band Patient awake    Reviewed: Allergy & Precautions, NPO status , Patient's Chart, lab work & pertinent test results  History of Anesthesia Complications (+) PONV  Airway Mallampati: I  TM Distance: >3 FB Neck ROM: Full    Dental  (+) Teeth Intact, Dental Advisory Given   Pulmonary former smoker,    breath sounds clear to auscultation       Cardiovascular  Rhythm:Regular Rate:Normal     Neuro/Psych    GI/Hepatic GERD  Medicated and Controlled,  Endo/Other  Morbid obesity  Renal/GU      Musculoskeletal   Abdominal   Peds  Hematology   Anesthesia Other Findings   Reproductive/Obstetrics                            Anesthesia Physical Anesthesia Plan  ASA: II  Anesthesia Plan: General   Post-op Pain Management:    Induction: Intravenous  Airway Management Planned: LMA  Additional Equipment:   Intra-op Plan:   Post-operative Plan: Extubation in OR  Informed Consent: I have reviewed the patients History and Physical, chart, labs and discussed the procedure including the risks, benefits and alternatives for the proposed anesthesia with the patient or authorized representative who has indicated his/her understanding and acceptance.   Dental advisory given  Plan Discussed with: CRNA, Anesthesiologist and Surgeon  Anesthesia Plan Comments:         Anesthesia Quick Evaluation

## 2015-10-21 NOTE — Op Note (Signed)
Preoperative diagnosis: breast cancer need for venous access Postoperative diagnosis: same as above Procedure: right ij US guided powerport insertion Surgeon: Dr Serita Grammes EBL: minimal Anes: general  Specimens none Complications none Drains none Sponge count correct Dispo to pacu stable  Indications: This is a 62 yof due to begin systemic therapy for breast cancer. We discussed port placement.   Procedure: After informed consent was obtained the patient was taken to the operating room. She was given antibiotics. Sequential compression devices were on her legs. She was then placed under general anesthesia with an LMA. Then she was then prepped and draped in the standard sterile surgical fashion. Surgical timeout was then performed.  I used the ultrasound to identify the right internal jugular vein. I then accessed the vein using the ultrasound. This aspirated blood. I then placed the wire.This was confirmed by fluoroscopy to be in the correct position. I created a pocket on the right chest. I tunneled the line between the 2 sites. I then dilated the tract and placed the dilator assembly with the sheath. This was done under fluoroscopy. I then removed the sheath and dilator. The wire was also removed. The line was then pulled back to be in the distal cava. I hooked this up to the port. I sutured this into place with 2-0 Prolene in 2 places. This aspirated blood and flushed easily.This was confirmed with a final fluoroscopy. I then closed this with 2-0 Vicryl and 4-0 Monocryl. Dermabond was placed on both the incisions.She tolerated this well and was transferred to the recovery room in stable condition.

## 2015-10-21 NOTE — Interval H&P Note (Signed)
History and Physical Interval Note:  10/21/2015 7:14 AM  Krystal [name]  has presented today for surgery, with the diagnosis of BREAST CANCER  The various methods of treatment have been discussed with the patient and family. After consideration of risks, benefits and other options for treatment, the patient has consented to  Procedure(s): INSERTION PORT-A-CATH WITH ULTRASOUND, possible excision of left breast cyst less than 1cm (N/A) as a surgical intervention .  The patient's history has been reviewed, patient examined, no change in status, stable for surgery.  I have reviewed the patient's chart and labs.  Questions were answered to the patient's satisfaction.     Boots Mcglown

## 2015-10-22 ENCOUNTER — Encounter (HOSPITAL_BASED_OUTPATIENT_CLINIC_OR_DEPARTMENT_OTHER): Payer: Self-pay | Admitting: General Surgery

## 2015-10-26 ENCOUNTER — Other Ambulatory Visit: Payer: Self-pay

## 2015-10-26 ENCOUNTER — Telehealth: Payer: Self-pay | Admitting: Hematology and Oncology

## 2015-10-26 ENCOUNTER — Encounter: Payer: Self-pay | Admitting: *Deleted

## 2015-10-26 ENCOUNTER — Other Ambulatory Visit: Payer: Commercial Managed Care - PPO

## 2015-10-26 MED ORDER — LIDOCAINE-PRILOCAINE 2.5-2.5 % EX CREA: 1.0000 "application " | TOPICAL_CREAM | CUTANEOUS | Status: DC | PRN

## 2015-10-26 NOTE — Telephone Encounter (Signed)
Per 3/21 pof reschedule 3/27 appointments to 1st week in April. Spoke with patient re cancellation and new appointments for lab/VG/tx 4/3 @ 11:45 am. Also per patient moved ched class from today to 3/31 @ 9:30 am. Patient has new date/times for 3/31 and 4/3 - also confirmed 3/24 echo and 3/29 cardiology. Patient will be given additional appointments once scheduled at 4/3 visit. Patient aware she will have opportunity to discuss and schedule next set of appointments with scheduler at check out - patient would like to be informed re each appointment she will have in any given setting so she can plan accordingly.

## 2015-10-29 ENCOUNTER — Other Ambulatory Visit: Payer: Self-pay

## 2015-10-29 ENCOUNTER — Ambulatory Visit (HOSPITAL_COMMUNITY): Payer: Commercial Managed Care - PPO | Attending: Cardiovascular Disease

## 2015-10-29 DIAGNOSIS — H50112 Monocular exotropia, left eye: Secondary | ICD-10-CM | POA: Diagnosis present

## 2015-10-29 DIAGNOSIS — C50112 Malignant neoplasm of central portion of left female breast: Secondary | ICD-10-CM | POA: Diagnosis not present

## 2015-11-01 ENCOUNTER — Ambulatory Visit: Payer: Commercial Managed Care - PPO

## 2015-11-01 ENCOUNTER — Other Ambulatory Visit: Payer: Self-pay | Admitting: Hematology and Oncology

## 2015-11-01 ENCOUNTER — Ambulatory Visit: Payer: Commercial Managed Care - PPO | Admitting: Hematology and Oncology

## 2015-11-01 ENCOUNTER — Other Ambulatory Visit: Payer: Commercial Managed Care - PPO

## 2015-11-01 DIAGNOSIS — C50112 Malignant neoplasm of central portion of left female breast: Secondary | ICD-10-CM

## 2015-11-01 MED ORDER — LIDOCAINE-PRILOCAINE 2.5-2.5 % EX CREA: TOPICAL_CREAM | CUTANEOUS | Status: DC

## 2015-11-01 NOTE — Telephone Encounter (Signed)
Chart reviewed.

## 2015-11-03 ENCOUNTER — Encounter (HOSPITAL_COMMUNITY): Payer: Self-pay | Admitting: Internal Medicine

## 2015-11-03 ENCOUNTER — Ambulatory Visit (HOSPITAL_COMMUNITY)
Admission: RE | Admit: 2015-11-03 | Discharge: 2015-11-03 | Disposition: A | Discharge status: Home / Self Care | Payer: Commercial Managed Care - PPO | Source: Ambulatory Visit | Attending: Internal Medicine | Admitting: Internal Medicine

## 2015-11-03 VITALS — BP 140/82 | HR 98 | Wt 263.0 lb

## 2015-11-03 DIAGNOSIS — C50112 Malignant neoplasm of central portion of left female breast: Secondary | ICD-10-CM

## 2015-11-03 DIAGNOSIS — Z170 Estrogen receptor positive status [ER+]: Secondary | ICD-10-CM | POA: Diagnosis not present

## 2015-11-03 DIAGNOSIS — K219 Gastro-esophageal reflux disease without esophagitis: Secondary | ICD-10-CM | POA: Insufficient documentation

## 2015-11-03 DIAGNOSIS — Z9013 Acquired absence of bilateral breasts and nipples: Secondary | ICD-10-CM | POA: Insufficient documentation

## 2015-11-03 DIAGNOSIS — Z885 Allergy status to narcotic agent status: Secondary | ICD-10-CM | POA: Insufficient documentation

## 2015-11-03 DIAGNOSIS — C50912 Malignant neoplasm of unspecified site of left female breast: Secondary | ICD-10-CM | POA: Diagnosis not present

## 2015-11-03 DIAGNOSIS — Z79899 Other long term (current) drug therapy: Secondary | ICD-10-CM | POA: Insufficient documentation

## 2015-11-03 DIAGNOSIS — Z87891 Personal history of nicotine dependence: Secondary | ICD-10-CM | POA: Insufficient documentation

## 2015-11-03 NOTE — Progress Notes (Signed)
Patient ID: Krystal [name], female   DOB: 1966-07-16, 50 y.o.   MRN: [id number]   [facility] CONSULT NOTE  Referring Physician: Lindi Adie Primary Care: Primary Cardiologist:  HPI:  INTERVAL HISTORY: Krystal [name] is a 50 year old with above-mentioned history of bilateral mastectomies for left-sided multifocal invasive ductal carcinoma that was noticed on Oncotype and we recommended hormonal therapy but she was noncompliant and did not take any antiestrogen therapy. She went a full year without any treatment and felt a lump in the breast while she was undergoing breast reconstruction and fat grafting. Lap C was then performed which came back as recurrent invasive ductal carcinoma. She underwent resection on 09/16/2015 by Dr. Donne Hazel and final pathology revealed that this was an ER/PR and HER-2 positive breast cancer. Because of this she was referred back was performed discussion. She tells me that she resume taking anastrozole about a month ago after he was diagnosed with a recurrence. She does complain of arthralgias but otherwise tolerating it fairly well.  Denies h/o known heart problems. Has significant fatigue. Occasional DOE and edema. + hot flashes due to menopause     Cancer of central portion of left female breast ([name])   04/16/2014 Surgery Bilateral mastectomies: Left breast : Multifocal invasive ductal carcinoma positive for lymphovascular invasion, 1.2 cm and 0.6 cm, grade 3, high-grade DCIS with comedonecrosis, 4 SLN negative T1 C. N0 M0 stage IA: Oncotype DX 13 (ROR 9%)   04/16/2014 Surgery Left upper back melanoma resected no residual melanoma identified on re-resection margins negative   05/26/2014 - 08/03/2015 Anti-estrogen oral therapy Tamoxifen 20 mg daily with a plan to switch her to aromatase inhibitors once she gets oophorectomy ( patient did not continue antiestrogen therapy by choice)   08/13/2014 Surgery oophorectomy with total abdominal hysterectomy    09/08/2015 -  Anti-estrogen oral therapy Resumed anastrozole after she found recurrence of breast cancer   09/16/2015 Surgery Skin, left: IDC grade 3; 0.5 cm, margins negative              Echo 60-65% Lat s' 10.8 GLS not done    Review of Systems: [y] = yes, [ ]  = no   General: Weight gain [ ] ; Weight loss [ ] ; Anorexia [ ] ; Fatigue Blue.Reese ]; Fever [ ] ; Chills [ ] ; Weakness [ ]   Cardiac: Chest pain/pressure [ ] ; Resting SOB [ ] ; Exertional SOB [ ] ; Orthopnea [ ] ; Pedal Edema [ ] ; Palpitations [ ] ; Syncope [ ] ; Presyncope [ ] ; Paroxysmal nocturnal dyspnea[ ]   Pulmonary: Cough [ ] ; Wheezing[ ] ; Hemoptysis[ ] ; Sputum [ ] ; Snoring [ ]   GI: Vomiting[ ] ; Dysphagia[ ] ; Melena[ ] ; Hematochezia [ ] ; Heartburn[ ] ; Abdominal pain [ ] ; Constipation [ ] ; Diarrhea [ ] ; BRBPR [ ]   GU: Hematuria[ ] ; Dysuria [ ] ; Nocturia[ ]   Vascular: Pain in legs with walking [ ] ; Pain in feet with lying flat [ ] ; Non-healing sores [ ] ; Stroke [ ] ; TIA [ ] ; Slurred speech [ ] ;  Neuro: Headaches[ ] ; Vertigo[ ] ; Seizures[ ] ; Paresthesias[ ] ;Blurred vision [ ] ; Diplopia [ ] ; Vision changes [ ]   Ortho/Skin: Arthritis Blue.Reese ]; Joint pain Blue.Reese ]; Muscle pain [ ] ; Joint swelling [ ] ; Back Pain [ ] ; Rash [ ]   Psych: Depression[ ] ; Anxiety[ ]   Heme: Bleeding problems [ ] ; Clotting disorders [ ] ; Anemia [ ]   Endocrine: Diabetes [ ] ; Thyroid dysfunction[ ]    Past Medical History  Diagnosis Date  . BRCA1 positive     BRCA1  [id number]  . Arthritis     shoulders  . GERD (gastroesophageal reflux disease)   . History of breast cancer 2015  . Complication of anesthesia     respiratory depression/arrest with Dilaudid  . PONV (postoperative nausea and vomiting)   . Dental crowns present   . Cough with sputum 09/10/2015    clear sputum, per pt.  . Stuffy and runny nose 09/10/2015    green drainage from nose, per pt.  . Chest wall mass 09/2015    Current Outpatient Prescriptions  Medication Sig Dispense Refill  . Alum  Hydroxide-Mag Carbonate (GAVISCON PO) Take 2 tablets by mouth daily as needed (for heartburn).     Marland Kitchen anastrozole (ARIMIDEX) 1 MG tablet TAKE 1 TABLET (1 MG TOTAL) BY MOUTH DAILY. 30 tablet 1  . fluocinonide cream (LIDEX) 0.05 % Reported on 10/07/2015    . Ibuprofen-Diphenhydramine Cit (ADVIL PM PO) Take by mouth.    . lidocaine-prilocaine (EMLA) cream Apply 1 application topically as needed. 30 g 1  . lidocaine-prilocaine (EMLA) cream Apply to affected area once 30 g 3  . oxyCODONE (OXY IR/ROXICODONE) 5 MG immediate release tablet Take 1-2 tablets (5-10 mg total) by mouth every 6 (six) hours as needed for moderate pain, severe pain or breakthrough pain. (Patient not taking: Reported on 2015/11/12) 15 tablet 0  . permethrin (ELIMITE) 5 % cream Reported on Nov 12, 2015     No current facility-administered medications for this encounter.    Allergies  Allergen Reactions  . Dilaudid [Hydromorphone] Other (See Comments)    RESPIRATORY DEPRESSION  . Codeine Nausea And Vomiting      Social History   Social History  . Marital Status: Widowed    Spouse Name: N/A  . Number of Children: 3  . Years of Education: N/A   Occupational History  . Oncologist   Social History Main Topics  . Smoking status: Former Smoker -- 0.00 packs/day for 0 years    Quit date: 04/17/2014  . Smokeless tobacco: Never Used  . Alcohol Use: Yes     Comment: occasionally  . Drug Use: No  . Sexual Activity: Not on file   Other Topics Concern  . Not on file   Social History Narrative      Family History  Problem Relation Age of Onset  . Breast cancer Paternal Aunt 35    deceased 32s  . Breast cancer Paternal Grandmother     dx 64s; ? if had 2nd BC in 83s; deceased 17s  . Pancreatic cancer Paternal Uncle 11    smoker; deceased    Filed Vitals:   Nov 12, 2015 0940  BP: 140/82  Pulse: 98  Weight: 263 lb (119.296 kg)  SpO2: 97%    PHYSICAL EXAM: General:  Well  appearing. No respiratory difficulty HEENT: normal Neck: supple. no JVD. Carotids 2+ bilat; no bruits. No lymphadenopathy or thryomegaly appreciated. Cor: PMI nondisplaced. Regular rate & rhythm. No rubs, gallops or murmurs. Lungs: clear Abdomen: obese soft, nontender, nondistended. No hepatosplenomegaly. No bruits or masses. Good bowel sounds. Extremities: no cyanosis, clubbing, rash, edema Neuro: alert & oriented x 3, cranial nerves grossly intact. moves all 4 extremities w/o difficulty. Affect pleasant.    ASSESSMENT & PLAN: 1. Breast CA, recurrent HER-2-neu+    --Explained incidence of Herceptin cardiotoxicity and role of Cardio-oncology clinic at length. Echo images reviewed personally. All parameters stable. Reviewed signs and symptoms of HF to look for. Continue Herceptin. Follow-up with echo in 3 months.  Saleh Ulbrich,MD 10:10 AM

## 2015-11-03 NOTE — Patient Instructions (Signed)
Follow up 3 months with echocardiogram. 

## 2015-11-05 ENCOUNTER — Other Ambulatory Visit: Payer: Commercial Managed Care - PPO

## 2015-11-05 ENCOUNTER — Other Ambulatory Visit: Payer: Self-pay

## 2015-11-05 DIAGNOSIS — C50112 Malignant neoplasm of central portion of left female breast: Secondary | ICD-10-CM

## 2015-11-07 NOTE — Assessment & Plan Note (Signed)
T1 C. N0 M0 stage IA ER 100%, PR 95%, HER-2 negative ratio 1.9, Ki-67 15% with additional satellite nodule 0.6 cm: Oncotype DX recurrence score is 13 (9% risk of recurrence)  Antiestrogen therapy: Patient took tamoxifen from 05/26/2014 to 06/07/2014 and stopped it because of itching. Patient had undergone oophorectomy with TAH for reduction of risk of ovarian cancer. She never resumed anastrozole. She decided on her own to not take antiestrogen therapy at that time.  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 Radiation oncology consultation ( patient is very concerned about the role of radiation as she believes that the radiation might contract her implants.) 2. I recommended Herceptin plus anastrozole therapy. 3. If she needs radiation, we will hold anastrozole until she completes radiation but will continue with Herceptin. Given the small size of the tumor and borderline HER-2 positivity, I did not think she would need chemotherapy in addition to Herceptin. I believe the anastrozole in combination with Herceptin would be fairly effective.  Patient was very emotional and was glad that she may not need chemotherapy and could get away with Herceptin alone.  I discussed the pros and cons of Herceptin including the risk of cardiomyopathy and decline in ejection fraction. I will request for port placement from Dr. Donne Hazel.

## 2015-11-08 ENCOUNTER — Telehealth: Payer: Self-pay | Admitting: *Deleted

## 2015-11-08 ENCOUNTER — Ambulatory Visit (HOSPITAL_BASED_OUTPATIENT_CLINIC_OR_DEPARTMENT_OTHER): Payer: Commercial Managed Care - PPO | Admitting: Hematology and Oncology

## 2015-11-08 ENCOUNTER — Encounter: Payer: Self-pay | Admitting: Hematology and Oncology

## 2015-11-08 ENCOUNTER — Other Ambulatory Visit (HOSPITAL_BASED_OUTPATIENT_CLINIC_OR_DEPARTMENT_OTHER): Payer: Commercial Managed Care - PPO

## 2015-11-08 ENCOUNTER — Telehealth: Payer: Self-pay | Admitting: Hematology and Oncology

## 2015-11-08 ENCOUNTER — Ambulatory Visit (HOSPITAL_BASED_OUTPATIENT_CLINIC_OR_DEPARTMENT_OTHER): Payer: Commercial Managed Care - PPO

## 2015-11-08 VITALS — BP 120/77 | HR 82 | Temp 97.9°F | Resp 18 | Ht 64.0 in | Wt 262.0 lb

## 2015-11-08 VITALS — BP 116/66 | HR 94 | Temp 98.4°F | Resp 18

## 2015-11-08 DIAGNOSIS — C50112 Malignant neoplasm of central portion of left female breast: Secondary | ICD-10-CM

## 2015-11-08 DIAGNOSIS — Z5112 Encounter for antineoplastic immunotherapy: Secondary | ICD-10-CM | POA: Diagnosis not present

## 2015-11-08 LAB — CBC WITH DIFFERENTIAL/PLATELET
BASO%: 1.3 % (ref 0.0–2.0)
Basophils Absolute: 0.1 10*3/uL (ref 0.0–0.1)
EOS%: 6.1 % (ref 0.0–7.0)
Eosinophils Absolute: 0.5 10*3/uL (ref 0.0–0.5)
HCT: 41.2 % (ref 34.8–46.6)
HGB: 13.7 g/dL (ref 11.6–15.9)
LYMPH%: 20.2 % (ref 14.0–49.7)
MCH: 29.5 pg (ref 25.1–34.0)
MCHC: 33.2 g/dL (ref 31.5–36.0)
MCV: 88.6 fL (ref 79.5–101.0)
MONO#: 0.5 10*3/uL (ref 0.1–0.9)
MONO%: 6.1 % (ref 0.0–14.0)
NEUT#: 5 10*3/uL (ref 1.5–6.5)
NEUT%: 66.3 % (ref 38.4–76.8)
Platelets: 251 10*3/uL (ref 145–400)
RBC: 4.65 10*6/uL (ref 3.70–5.45)
RDW: 13.9 % (ref 11.2–14.5)
WBC: 7.6 10*3/uL (ref 3.9–10.3)
lymph#: 1.5 10*3/uL (ref 0.9–3.3)

## 2015-11-08 LAB — COMPREHENSIVE METABOLIC PANEL
ALT: 37 U/L (ref 0–55)
AST: 20 U/L (ref 5–34)
Albumin: 3.8 g/dL (ref 3.5–5.0)
Alkaline Phosphatase: 84 U/L (ref 40–150)
Anion Gap: 8 mEq/L (ref 3–11)
BUN: 10.1 mg/dL (ref 7.0–26.0)
CO2: 30 mEq/L — ABNORMAL HIGH (ref 22–29)
Calcium: 9.8 mg/dL (ref 8.4–10.4)
Chloride: 106 mEq/L (ref 98–109)
Creatinine: 0.8 mg/dL (ref 0.6–1.1)
EGFR: 85 mL/min/{1.73_m2} — ABNORMAL LOW (ref >90)
Glucose: 134 mg/dl (ref 70–140)
Potassium: 4.4 mEq/L (ref 3.5–5.1)
Sodium: 144 mEq/L (ref 136–145)
Total Bilirubin: 0.42 mg/dL (ref 0.20–1.20)
Total Protein: 7.5 g/dL (ref 6.4–8.3)

## 2015-11-08 MED ORDER — HEPARIN SOD (PORK) LOCK FLUSH 100 UNIT/ML IV SOLN
500.0000 [IU] | Freq: Once | INTRAVENOUS | Status: AC | PRN
  Administered 2015-11-08: 500 [IU]
  Filled 2015-11-08: qty 5

## 2015-11-08 MED ORDER — SODIUM CHLORIDE 0.9 % IV SOLN
Freq: Once | INTRAVENOUS | Status: AC
  Administered 2015-11-08: 13:00:00 via INTRAVENOUS

## 2015-11-08 MED ORDER — SODIUM CHLORIDE 0.9% FLUSH
10.0000 mL | INTRAVENOUS | Status: DC | PRN
  Administered 2015-11-08: 10 mL
  Filled 2015-11-08: qty 10

## 2015-11-08 MED ORDER — ACETAMINOPHEN 325 MG PO TABS
ORAL_TABLET | ORAL | Status: AC
  Filled 2015-11-08: qty 2

## 2015-11-08 MED ORDER — MEPERIDINE HCL 25 MG/ML IJ SOLN
INTRAMUSCULAR | Status: AC
  Filled 2015-11-08: qty 1

## 2015-11-08 MED ORDER — TRASTUZUMAB CHEMO INJECTION 440 MG
8.0000 mg/kg | Freq: Once | INTRAVENOUS | Status: AC
  Administered 2015-11-08: 924 mg via INTRAVENOUS
  Filled 2015-11-08: qty 44

## 2015-11-08 MED ORDER — METHYLPREDNISOLONE SODIUM SUCC 125 MG IJ SOLR
125.0000 mg | Freq: Once | INTRAMUSCULAR | Status: AC | PRN
  Administered 2015-11-08: 125 mg via INTRAVENOUS

## 2015-11-08 MED ORDER — DIPHENHYDRAMINE HCL 25 MG PO CAPS
ORAL_CAPSULE | ORAL | Status: AC
  Filled 2015-11-08: qty 2

## 2015-11-08 MED ORDER — FAMOTIDINE IN NACL 20-0.9 MG/50ML-% IV SOLN
20.0000 mg | Freq: Once | INTRAVENOUS | Status: AC | PRN
  Administered 2015-11-08: 20 mg via INTRAVENOUS

## 2015-11-08 MED ORDER — DIPHENHYDRAMINE HCL 25 MG PO CAPS
50.0000 mg | ORAL_CAPSULE | Freq: Once | ORAL | Status: AC
  Administered 2015-11-08: 50 mg via ORAL

## 2015-11-08 MED ORDER — MEPERIDINE HCL 25 MG/ML IJ SOLN
25.0000 mg | Freq: Once | INTRAMUSCULAR | Status: AC
  Administered 2015-11-08: 25 mg via INTRAVENOUS

## 2015-11-08 MED ORDER — ACETAMINOPHEN 325 MG PO TABS
650.0000 mg | ORAL_TABLET | Freq: Once | ORAL | Status: AC
  Administered 2015-11-08: 650 mg via ORAL

## 2015-11-08 NOTE — Patient Instructions (Addendum)
[facility] Discharge Instructions for Patients Receiving Chemotherapy  Today you received the following chemotherapy agents Herceptin.  To help prevent nausea and vomiting after your treatment, we encourage you to take your nausea medication as directed   If you develop nausea and vomiting that is not controlled by your nausea medication, call the clinic.   BELOW ARE SYMPTOMS THAT SHOULD BE REPORTED IMMEDIATELY:  *FEVER GREATER THAN 100.5 F  *CHILLS WITH OR WITHOUT FEVER  NAUSEA AND VOMITING THAT IS NOT CONTROLLED WITH YOUR NAUSEA MEDICATION  *UNUSUAL SHORTNESS OF BREATH  *UNUSUAL BRUISING OR BLEEDING  TENDERNESS IN MOUTH AND THROAT WITH OR WITHOUT PRESENCE OF ULCERS  *URINARY PROBLEMS  *BOWEL PROBLEMS  UNUSUAL RASH Items with * indicate a potential emergency and should be followed up as soon as possible.  Feel free to call the clinic you have any questions or concerns. The clinic phone number is [phone].  Trastuzumab injection for infusion What is this medicine? TRASTUZUMAB (tras TOO zoo mab) is a monoclonal antibody. It is used to treat breast cancer and stomach cancer. This medicine may be used for other purposes; ask your health care provider or pharmacist if you have questions. What should I tell my health care provider before I take this medicine? They need to know if you have any of these conditions: -heart disease -heart failure -infection (especially a virus infection such as chickenpox, cold sores, or herpes) -lung or breathing disease, like asthma -recent or ongoing radiation therapy -an unusual or allergic reaction to trastuzumab, benzyl alcohol, or other medications, foods, dyes, or preservatives -pregnant or trying to get pregnant -breast-feeding How should I use this medicine? This drug is given as an infusion into a vein. It is administered in a hospital or clinic by a specially trained health care professional. Talk to your  pediatrician regarding the use of this medicine in children. This medicine is not approved for use in children. Overdosage: If you think you have taken too much of this medicine contact a poison control center or emergency room at once. NOTE: This medicine is only for you. Do not share this medicine with others. What if I miss a dose? It is important not to miss a dose. Call your doctor or health care professional if you are unable to keep an appointment. What may interact with this medicine? -doxorubicin -warfarin This list may not describe all possible interactions. Give your health care provider a list of all the medicines, herbs, non-prescription drugs, or dietary supplements you use. Also tell them if you smoke, drink alcohol, or use illegal drugs. Some items may interact with your medicine. What should I watch for while using this medicine? Visit your doctor for checks on your progress. Report any side effects. Continue your course of treatment even though you feel ill unless your doctor tells you to stop. Call your doctor or health care professional for advice if you get a fever, chills or sore throat, or other symptoms of a cold or flu. Do not treat yourself. Try to avoid being around people who are sick. You may experience fever, chills and shaking during your first infusion. These effects are usually mild and can be treated with other medicines. Report any side effects during the infusion to your health care professional. Fever and chills usually do not happen with later infusions. Do not become pregnant while taking this medicine or for 7 months after stopping it. Women should inform their doctor if they wish to become pregnant  or think they might be pregnant. Women of child-bearing potential will need to have a negative pregnancy test before starting this medicine. There is a potential for serious side effects to an unborn child. Talk to your health care professional or pharmacist for more  information. Do not breast-feed an infant while taking this medicine or for 7 months after stopping it. Women must use effective birth control with this medicine. What side effects may I notice from receiving this medicine? Side effects that you should report to your doctor or other health care professional as soon as possible: -breathing difficulties -chest pain or palpitations -cough -dizziness or fainting -fever or chills, sore throat -skin rash, itching or hives -swelling of the legs or ankles -unusually weak or tired Side effects that usually do not require medical attention (report to your doctor or other health care professional if they continue or are bothersome): -loss of appetite -headache -muscle aches -nausea This list may not describe all possible side effects. Call your doctor for medical advice about side effects. You may report side effects to FDA at 1-800-FDA-1088. Where should I keep my medicine? This drug is given in a hospital or clinic and will not be stored at home. NOTE: This sheet is a summary. It may not cover all possible information. If you have questions about this medicine, talk to your doctor, pharmacist, or health care provider.    2016, Elsevier/Gold Standard. (2014-10-30 11:49:32)

## 2015-11-08 NOTE — Progress Notes (Signed)
Approximately 1355 pt stated she was beginning to feel cold.  Warm blanket given.  1400 pt stated she was feeling tightness to chest and back and she was "shivering" (rigors started at this time).  Dr. Lindi Adie notified & herceptin paused/NS line started wide open.  VSS at this time, HR slightly elevated to 110.  Per Dr. Lindi Adie, administer 20mg  pepcid IV and 125 solumedrol IV (see MAR for times).  Rigors worsening after administration of emergency meds and HR up to 130.  Dr. Lindi Adie to chairside and 25mg  Demerol administered at 1418.  Rigors immediately stopped after administration of demerol.  Pt stated she felt better.  Per Dr. Lindi Adie, ok to wait a few minutes then slowly restart herceptin.  Herceptin restarted at 1430 at half of initial rate, pt monitored closely and VSS at this time (see flowsheet for complete vitals).    Pt completed remainder of Herceptin infusion without problems.  VSS, no complaints from pt.

## 2015-11-08 NOTE — Progress Notes (Signed)
Unable to get in to exam room prior to MD.  No assessment performed.  

## 2015-11-08 NOTE — Progress Notes (Signed)
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 N0 M0 stage IA: Oncotype DX 13 (ROR 9%)   04/16/2014 Surgery Left upper back melanoma resected no residual melanoma identified on re-resection margins negative   05/26/2014 - 06/08/2015 Anti-estrogen oral therapy Tamoxifen 20 mg daily with a plan to switch her to aromatase inhibitors once she gets oophorectomy ( patient did not continue antiestrogen therapy by choice)   08/13/2014 Surgery oophorectomy with total abdominal hysterectomy   09/08/2015 -  Anti-estrogen oral therapy Resumed anastrozole after she found recurrence of breast cancer   09/16/2015 Surgery Skin, left: IDC grade 3; 0.5 cm, margins negative, ER 95%, PR 10%, HER-2 positive ratio 2.08   11/08/2015 -  Chemotherapy Herceptin every 3 weeks plus anastrozole   CHIEF COMPLIANT: Cycle 1 day 1 Herceptin  INTERVAL HISTORY: Krystal [name] is a 42 cm with above-mentioned history of bilateral mastectomies and oophorectomy and is currently on adjuvant therapy with Herceptin with anastrozole. Today is cycle 1 of Herceptin. She appears to be quite anxious to receive the treatment. Should multiple question as to why she needs radiation.  REVIEW OF SYSTEMS:   Constitutional: Denies fevers, chills or abnormal weight loss Eyes: Denies blurriness of vision Ears, nose, mouth, throat, and face: Denies mucositis or sore throat Respiratory: Denies  cough, dyspnea or wheezes Cardiovascular: Denies palpitation, chest discomfort Gastrointestinal:  Denies nausea, heartburn or change in bowel habits Skin: Denies abnormal skin rashes Lymphatics: Denies new lymphadenopathy or easy bruising Neurological:Denies numbness, tingling or new weaknesses Behavioral/Psych: Mood is stable, no new changes  Extremities: No lower extremity edema Breast:  Bilateral mastectomies with resection of the local recurrence. All other systems were reviewed with the patient and are negative.  I have reviewed the past medical history, past surgical history, social history and family history with the patient and they are unchanged from previous note.  ALLERGIES:  is allergic to dilaudid and codeine.  MEDICATIONS:  Current Outpatient Prescriptions  Medication Sig Dispense Refill  . Alum Hydroxide-Mag Carbonate (GAVISCON PO) Take 2 tablets by mouth daily as needed (for heartburn).     Marland Kitchen anastrozole (ARIMIDEX) 1 MG tablet TAKE 1 TABLET (1 MG TOTAL) BY MOUTH DAILY. 30 tablet 1  . fluocinonide cream (LIDEX) 0.05 % Reported on 10/07/2015    . Ibuprofen-Diphenhydramine Cit (ADVIL PM PO) Take by mouth.    . lidocaine-prilocaine (EMLA) cream Apply 1 application topically as needed. 30 g 1  . lidocaine-prilocaine (EMLA) cream Apply to affected area once 30 g 3  . oxyCODONE (OXY IR/ROXICODONE) 5 MG immediate release tablet Take 1-2 tablets (5-10 mg total) by mouth every 6 (six) hours as needed for moderate pain, severe pain or breakthrough pain. (Patient not taking: Reported on 11/03/2015) 15 tablet 0  . permethrin (ELIMITE) 5 % cream Reported on 11/03/2015     No current facility-administered medications for this visit.   Facility-Administered Medications Ordered in Other  Visits  Medication Dose Route Frequency Provider Last Rate Last Dose  . heparin lock flush 100 unit/mL  500 Units Intracatheter Once PRN [name], MD      . sodium chloride flush (NS) 0.9 % injection 10  mL  10 mL Intracatheter PRN [name], MD        PHYSICAL EXAMINATION: ECOG PERFORMANCE STATUS: 1 - Symptomatic but completely ambulatory  Filed Vitals:   11/08/15 1157  BP: 120/77  Pulse: 82  Temp: 97.9 F (36.6 C)  Resp: 18   Filed Weights   11/08/15 1157  Weight: 262 lb (118.842 kg)    GENERAL:alert, no distress and comfortable SKIN: skin color, texture, turgor are normal, no rashes or significant lesions EYES: normal, Conjunctiva are pink and non-injected, sclera clear OROPHARYNX:no exudate, no erythema and lips, buccal mucosa, and tongue normal  NECK: supple, thyroid normal size, non-tender, without nodularity LYMPH:  no palpable lymphadenopathy in the cervical, axillary or inguinal LUNGS: clear to auscultation and percussion with normal breathing effort HEART: regular rate & rhythm and no murmurs and no lower extremity edema ABDOMEN:abdomen soft, non-tender and normal bowel sounds MUSCULOSKELETAL:no cyanosis of digits and no clubbing  NEURO: alert & oriented x 3 with fluent speech, no focal motor/sensory deficits EXTREMITIES: No lower extremity edema  LABORATORY DATA:  I have reviewed the data as listed   Chemistry      Component Value Date/Time   NA 144 11/08/2015 1147   NA 140 04/14/2014 1108   K 4.4 11/08/2015 1147   K 4.9 04/14/2014 1108   CL 100 04/14/2014 1108   CO2 30* 11/08/2015 1147   CO2 26 04/14/2014 1108   BUN 10.1 11/08/2015 1147   BUN 9 04/14/2014 1108   CREATININE 0.8 11/08/2015 1147   CREATININE 0.70 04/14/2014 1108      Component Value Date/Time   CALCIUM 9.8 11/08/2015 1147   CALCIUM 9.0 04/14/2014 1108   ALKPHOS 84 11/08/2015 1147   ALKPHOS 68 04/14/2014 1108   AST 20 11/08/2015 1147   AST 14 04/14/2014 1108   ALT 37 11/08/2015 1147   ALT 15 04/14/2014 1108   BILITOT 0.42 11/08/2015 1147   BILITOT 0.2* 04/14/2014 1108       Lab Results  Component Value Date   WBC 7.6 11/08/2015   HGB 13.7 11/08/2015   HCT 41.2 11/08/2015     MCV 88.6 11/08/2015   PLT 251 11/08/2015   NEUTROABS 5.0 11/08/2015   Right ASSESSMENT & PLAN:  Cancer of central portion of left female breast T1 C. N0 M0 stage IA ER 100%, PR 95%, HER-2 negative ratio 1.9, Ki-67 15% with additional satellite nodule 0.6 cm: Oncotype DX recurrence score is 13 (9% risk of recurrence)  Antiestrogen therapy: Patient took tamoxifen from 05/26/2014 to 06/07/2014 and stopped it because of itching. Patient had undergone oophorectomy with TAH for reduction of risk of ovarian cancer. She never resumed anastrozole. She decided on her own to not take antiestrogen therapy at that time.  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 Radiation oncology consultation ( patient is very concerned about the role of radiation as she believes that the radiation might contract her implants.) 2. Current treatment: Herceptin plus anastrozole therapy. Today is cycle 1 Herceptin  Herceptin toxicities: 1. Infusion reaction: Patient had shaking chills and rigors which responded to Demerol. These chills did not respond  to Benadryl or steroids are Pepcid.  I discussed with the patient that this is an first infusion reaction and that she would likely tolerate it if we resume her infusion at a slow rate.  Return to clinic in 3 weeks for cycle 2 and in 6 weeks for clinic follow-up.  No orders of the defined types were placed in this encounter.   The patient has a good understanding of the overall plan. she agrees with it. she will call with any problems that may develop before the next visit here.   Rulon Eisenmenger, MD 11/08/2015

## 2015-11-08 NOTE — Telephone Encounter (Signed)
Patient called.  She is due her first herceptin today.   She reports a "cold or allergy."   She denies fever.  She has clear sinus drainage.  She does have some cough - but no green or yellow drainage.   Let her know that she is getting herceptin (1st time) and this typically does not effect your white blood cells so she should be fine with this.  Will let Dr. Lindi Adie and his nurse know and if there is any change in this will let her know.  Home phone is [phone]

## 2015-11-08 NOTE — Telephone Encounter (Signed)
appt made and avs will print in treatment room °

## 2015-11-09 ENCOUNTER — Telehealth: Payer: Self-pay

## 2015-11-09 ENCOUNTER — Encounter: Payer: Self-pay | Admitting: Hematology and Oncology

## 2015-11-09 NOTE — Progress Notes (Signed)
Spoke w/ pt regarding copay assistance w/ [employer] for Herceptin.  Pt thinks she has met her deductible for the year so her ins should pay at 100% but she is going to call them to double check.  She will call me if should would like me to apply in her behalf.  We also talked about additional assistance for her personal bills.  Since she is working she would like to leave that for someone in greater need.

## 2015-11-09 NOTE — Telephone Encounter (Signed)
S/w pt that she is a little sore from being tense during the rigors. Other wise she is doing well, she is eating and drinking, she is having BMs and urinating OK. She has no fever. B/c of the reaction to first herceptin she is wondering about changes for the 2nd infusion. It is noted dose was 924 mg over 90 minutes and is 693 over 30 minutes on next cycle. She is wanting to have infusion over an hour rather than 30 minutes to possibly forestall another reaction. She would like to NOT receive any additional premeds beyond tylenol and benadryl unless absolutely necessary.

## 2015-11-09 NOTE — Telephone Encounter (Signed)
-----   Message from Arty Baumgartner, RN sent at 11/08/2015  3:37 PM EDT ----- Regarding: First time chemo/[place] First time herceptin/pt reacted during infusion (see note).  Dr. Lindi Adie.  Call back:  [phone]

## 2015-11-10 ENCOUNTER — Encounter: Payer: Self-pay | Admitting: Hematology and Oncology

## 2015-11-10 NOTE — Progress Notes (Signed)
Pt called me back & would like to apply for copay assistance w/ [employer] & for the [employer] after all so I enrolled her w/ [employer] & was approved for Herceptin for $25,000 from 11/09/15 to 11/07/16.  I forwarded a copy of approval letter to [facility] in the billing dept.  Pt is also approved for the $1000 [employer].  She will sign approval letter on 12/02/15 & I will also give her an expense sheet at that time.

## 2015-11-12 ENCOUNTER — Other Ambulatory Visit: Payer: Self-pay | Admitting: Hematology and Oncology

## 2015-11-16 ENCOUNTER — Ambulatory Visit
Admission: RE | Admit: 2015-11-16 | Discharge: 2015-11-16 | Disposition: A | Discharge status: Home / Self Care | Payer: Commercial Managed Care - PPO | Source: Ambulatory Visit | Attending: Radiation Oncology | Admitting: Radiation Oncology

## 2015-11-16 DIAGNOSIS — C50112 Malignant neoplasm of central portion of left female breast: Secondary | ICD-10-CM

## 2015-11-16 DIAGNOSIS — Z510 Encounter for antineoplastic radiation therapy: Secondary | ICD-10-CM | POA: Diagnosis not present

## 2015-11-17 NOTE — Progress Notes (Signed)
  Radiation Oncology         [phone] ________________________________  Name: Krystal [name] MRN: [id number]  Date: 11/16/2015  DOB: 1966/04/02  SIMULATION AND TREATMENT PLANNING NOTE    ICD-9-CM ICD-10-CM   1. 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  Identity was confirmed.  All relevant records and images related to the planned course of therapy were reviewed.  The patient freely provided informed written consent to proceed with treatment after reviewing the details related to the planned course of therapy. The consent form was witnessed and verified by the simulation staff.  Then, the patient was set-up in a stable reproducible  supine position for radiation therapy.  CT images were obtained.  Surface markings were placed.  The CT images were loaded into the planning software.  Then the target and avoidance structures were contoured.  Treatment planning then occurred.  The radiation prescription was entered and confirmed.  Then, I designed and supervised the construction of a total of 5 medically necessary complex treatment devices.  I have requested : 3D Simulation  I have requested a DVH of the following structures: heart, lungs, spinal cord, CTV.  I have ordered:dose calc.  PLAN:  The patient will receive 45 Gy in 25 fractions Directed at the left chest wall area axillary and supraclavicular region. The patient will be treated with 4 field technique. In addition the patient will be treated with bolus to ensure adequate dose to the superficial aspects of the target area. After 45 gray the patient will then proceed with a boost directed at the site of presentation along the left chest wall area and continue to a cumulative dose of 63  gray.   ________________________________  -----------------------------------  Blair Promise, PhD, MD

## 2015-11-18 DIAGNOSIS — Z510 Encounter for antineoplastic radiation therapy: Secondary | ICD-10-CM | POA: Diagnosis not present

## 2015-11-22 ENCOUNTER — Ambulatory Visit: Payer: Commercial Managed Care - PPO

## 2015-11-23 ENCOUNTER — Ambulatory Visit: Payer: Commercial Managed Care - PPO

## 2015-11-23 ENCOUNTER — Ambulatory Visit
Admission: RE | Admit: 2015-11-23 | Discharge: 2015-11-23 | Disposition: A | Discharge status: Home / Self Care | Payer: Commercial Managed Care - PPO | Source: Ambulatory Visit | Attending: Radiation Oncology | Admitting: Radiation Oncology

## 2015-11-23 DIAGNOSIS — C50112 Malignant neoplasm of central portion of left female breast: Secondary | ICD-10-CM

## 2015-11-23 DIAGNOSIS — Z510 Encounter for antineoplastic radiation therapy: Secondary | ICD-10-CM | POA: Diagnosis not present

## 2015-11-23 NOTE — Progress Notes (Signed)
  Radiation Oncology         [phone] ________________________________  Name: Krystal [name] MRN: [id number]  Date: 11/23/2015  DOB: 07/22/1966  Simulation Verification Note    ICD-9-CM ICD-10-CM   1. Cancer of central portion of left female breast ([place]) 174.1 C50.112     Status: outpatient  NARRATIVE: The patient was brought to the treatment unit and placed in the planned treatment position. The clinical setup was verified. Then port films were obtained and uploaded to the radiation oncology medical record software.  The treatment beams were carefully compared against the planned radiation fields. The position location and shape of the radiation fields was reviewed. They targeted volume of tissue appears to be appropriately covered by the radiation beams. Organs at risk appear to be excluded as planned.  Based on my personal review, I approved the simulation verification. The patient's treatment will proceed as planned.  -----------------------------------  Blair Promise, PhD, MD

## 2015-11-24 ENCOUNTER — Ambulatory Visit
Admission: RE | Admit: 2015-11-24 | Discharge: 2015-11-24 | Disposition: A | Discharge status: Home / Self Care | Payer: Commercial Managed Care - PPO | Source: Ambulatory Visit | Attending: Radiation Oncology | Admitting: Radiation Oncology

## 2015-11-24 DIAGNOSIS — Z510 Encounter for antineoplastic radiation therapy: Secondary | ICD-10-CM | POA: Diagnosis not present

## 2015-11-25 ENCOUNTER — Ambulatory Visit
Admission: RE | Admit: 2015-11-25 | Discharge: 2015-11-25 | Disposition: A | Discharge status: Home / Self Care | Payer: Commercial Managed Care - PPO | Source: Ambulatory Visit | Attending: Radiation Oncology | Admitting: Radiation Oncology

## 2015-11-25 DIAGNOSIS — Z510 Encounter for antineoplastic radiation therapy: Secondary | ICD-10-CM | POA: Diagnosis not present

## 2015-11-26 ENCOUNTER — Ambulatory Visit
Admission: RE | Admit: 2015-11-26 | Discharge: 2015-11-26 | Disposition: A | Discharge status: Home / Self Care | Payer: Commercial Managed Care - PPO | Source: Ambulatory Visit | Attending: Radiation Oncology | Admitting: Radiation Oncology

## 2015-11-26 DIAGNOSIS — Z510 Encounter for antineoplastic radiation therapy: Secondary | ICD-10-CM | POA: Diagnosis not present

## 2015-11-29 ENCOUNTER — Ambulatory Visit
Admission: RE | Admit: 2015-11-29 | Discharge: 2015-11-29 | Disposition: A | Discharge status: Home / Self Care | Payer: Commercial Managed Care - PPO | Source: Ambulatory Visit | Attending: Radiation Oncology | Admitting: Radiation Oncology

## 2015-11-29 DIAGNOSIS — Z510 Encounter for antineoplastic radiation therapy: Secondary | ICD-10-CM | POA: Diagnosis not present

## 2015-11-30 ENCOUNTER — Encounter: Payer: Self-pay | Admitting: Radiation Oncology

## 2015-11-30 ENCOUNTER — Ambulatory Visit
Admission: RE | Admit: 2015-11-30 | Discharge: 2015-11-30 | Disposition: A | Discharge status: Home / Self Care | Payer: Commercial Managed Care - PPO | Source: Ambulatory Visit | Attending: Radiation Oncology | Admitting: Radiation Oncology

## 2015-11-30 VITALS — BP 133/78 | HR 88 | Temp 98.2°F | Ht 64.0 in | Wt 258.7 lb

## 2015-11-30 DIAGNOSIS — C50112 Malignant neoplasm of central portion of left female breast: Secondary | ICD-10-CM

## 2015-11-30 DIAGNOSIS — Z510 Encounter for antineoplastic radiation therapy: Secondary | ICD-10-CM | POA: Diagnosis not present

## 2015-11-30 MED ORDER — ALRA NON-METALLIC DEODORANT (RAD-ONC)
1.0000 "application " | Freq: Once | TOPICAL | Status: AC
  Administered 2015-11-30: 1 via TOPICAL

## 2015-11-30 MED ORDER — RADIAPLEXRX EX GEL
Freq: Once | CUTANEOUS | Status: AC
  Administered 2015-11-30: 17:00:00 via TOPICAL

## 2015-11-30 NOTE — Progress Notes (Signed)
Krystal [name] has completed 5 fractions to her left chest wall.  She reports having jaw and both shoulders yesterday and is wondering if it was from Herceptin that she had on 11/08/15.  She has had nausea after the last 3 treatments and is asking for a prescription for Zofran.  She reports having fatigue.  The skin on her left breast is intact.  BP 133/78 mmHg  Pulse 88  Temp(Src) 98.2 F (36.8 C) (Oral)  Ht 5\' 4"  (1.626 m)  Wt 258 lb 11.2 oz (117.346 kg)  BMI 44.38 kg/m2  LMP 07/06/2014 (Within Weeks)

## 2015-11-30 NOTE — Progress Notes (Signed)
Pt here for patient teaching.  Pt given Radiation and You booklet, skin care instructions, Alra deodorant and Radiaplex gel. Pt reports they have not watched the Radiation Therapy Education video and has been given the link to watch it at home.  Reviewed areas of pertinence such as fatigue, skin changes, breast tenderness and breast swelling . Pt able to give teach back of to pat skin and use unscented/gentle soap,apply Radiaplex bid and avoid applying anything to skin within 4 hours of treatment. Pt demonstrated understanding and verbalizes understanding of information given and will contact nursing with any questions or concerns.     [email or web address]

## 2015-11-30 NOTE — Progress Notes (Signed)
  Radiation Oncology         [phone] ________________________________  Name: Krystal [name] MRN: [id number]  Date: 11/30/2015  DOB: 1966/03/10  Weekly Radiation Therapy Management     ICD-9-CM ICD-10-CM   1. Cancer of central portion of left female breast ([facility]) 174.1 C50.112      Current Dose: 9 Gy     Planned Dose:  63 Gy  Narrative . . . . . . . . The patient presents for routine under treatment assessment.                                   Krystal [name] has completed 5 fractions to her left chest wall. She reports having jaw and shoulder pain yesterday and is wondering if it was from Herceptin that she had on 11/08/15. She has had nausea after the last 3 treatments and is asking for a prescription for Zofran. She reports having fatigue. She reports breaking out in a rash on her face and saw a dermatologist who said it was rosacea. She asked if the topical treatment for this is safe to use during radiation. She reports that she sometimes has difficulty hearing out of her left ear.                                 Set-up films were reviewed.                                 The chart was checked. Physical Findings. . .  height is 5\' 4"  (1.626 m) and weight is 258 lb 11.2 oz (117.346 kg). Her oral temperature is 98.2 F (36.8 C). Her blood pressure is 133/78 and her pulse is 88.   Lungs are clear to auscultation bilaterally. Heart has regular rate and rhythm. Slight erythema in the left chest implant area. Patient has normal light reflex from the tympanic membrane bilaterally.  Impression . . . . . . . The patient is tolerating radiation. The patient's jaw and shoulder pain could possibly be caused by her arm positioning during treatment. I advised the patient that since the rosacea is outside of the treatment area, the topical treatment is safe to use. Plan . . . . . . . . . . . . Continue treatment as planned. I will prescribed Zofran for the patient's  nausea.  ________________________________   Blair Promise, PhD, MD  This document serves as a record of services personally performed by [name], MD. It was created on his behalf by Darcus Austin, a trained medical scribe. The creation of this record is based on the scribe's personal observations and the provider's statements to them. This document has been checked and approved by the attending provider.

## 2015-12-01 ENCOUNTER — Ambulatory Visit
Admission: RE | Admit: 2015-12-01 | Discharge: 2015-12-01 | Disposition: A | Discharge status: Home / Self Care | Payer: Commercial Managed Care - PPO | Source: Ambulatory Visit | Attending: Radiation Oncology | Admitting: Radiation Oncology

## 2015-12-01 DIAGNOSIS — Z510 Encounter for antineoplastic radiation therapy: Secondary | ICD-10-CM | POA: Diagnosis not present

## 2015-12-02 ENCOUNTER — Ambulatory Visit (HOSPITAL_BASED_OUTPATIENT_CLINIC_OR_DEPARTMENT_OTHER): Payer: Commercial Managed Care - PPO

## 2015-12-02 ENCOUNTER — Ambulatory Visit
Admission: RE | Admit: 2015-12-02 | Discharge: 2015-12-02 | Disposition: A | Discharge status: Home / Self Care | Payer: Commercial Managed Care - PPO | Source: Ambulatory Visit | Attending: Radiation Oncology | Admitting: Radiation Oncology

## 2015-12-02 VITALS — BP 109/72 | HR 79 | Temp 98.5°F | Resp 18

## 2015-12-02 DIAGNOSIS — Z510 Encounter for antineoplastic radiation therapy: Secondary | ICD-10-CM | POA: Diagnosis not present

## 2015-12-02 DIAGNOSIS — Z5112 Encounter for antineoplastic immunotherapy: Secondary | ICD-10-CM | POA: Diagnosis not present

## 2015-12-02 DIAGNOSIS — C50112 Malignant neoplasm of central portion of left female breast: Secondary | ICD-10-CM | POA: Diagnosis not present

## 2015-12-02 MED ORDER — DIPHENHYDRAMINE HCL 25 MG PO CAPS
ORAL_CAPSULE | ORAL | Status: AC
  Filled 2015-12-02: qty 2

## 2015-12-02 MED ORDER — METHYLPREDNISOLONE SODIUM SUCC 125 MG IJ SOLR
125.0000 mg | Freq: Once | INTRAMUSCULAR | Status: AC
  Administered 2015-12-02: 125 mg via INTRAVENOUS

## 2015-12-02 MED ORDER — DIPHENHYDRAMINE HCL 25 MG PO CAPS
50.0000 mg | ORAL_CAPSULE | Freq: Once | ORAL | Status: AC
  Administered 2015-12-02: 50 mg via ORAL

## 2015-12-02 MED ORDER — METHYLPREDNISOLONE SODIUM SUCC 125 MG IJ SOLR
INTRAMUSCULAR | Status: AC
Start: 2015-12-02 — End: 2015-12-02
  Filled 2015-12-02: qty 2

## 2015-12-02 MED ORDER — SODIUM CHLORIDE 0.9 % IV SOLN
Freq: Once | INTRAVENOUS | Status: AC
  Administered 2015-12-02: 13:00:00 via INTRAVENOUS

## 2015-12-02 MED ORDER — SODIUM CHLORIDE 0.9% FLUSH
10.0000 mL | INTRAVENOUS | Status: DC | PRN
  Administered 2015-12-02: 10 mL
  Filled 2015-12-02: qty 10

## 2015-12-02 MED ORDER — HEPARIN SOD (PORK) LOCK FLUSH 100 UNIT/ML IV SOLN
500.0000 [IU] | Freq: Once | INTRAVENOUS | Status: AC | PRN
  Administered 2015-12-02: 500 [IU]
  Filled 2015-12-02: qty 5

## 2015-12-02 MED ORDER — ACETAMINOPHEN 325 MG PO TABS
650.0000 mg | ORAL_TABLET | Freq: Once | ORAL | Status: AC
  Administered 2015-12-02: 650 mg via ORAL

## 2015-12-02 MED ORDER — ACETAMINOPHEN 325 MG PO TABS
ORAL_TABLET | ORAL | Status: AC
  Filled 2015-12-02: qty 2

## 2015-12-02 MED ORDER — TRASTUZUMAB CHEMO INJECTION 440 MG
6.0000 mg/kg | Freq: Once | INTRAVENOUS | Status: AC
  Administered 2015-12-02: 693 mg via INTRAVENOUS
  Filled 2015-12-02: qty 33

## 2015-12-02 NOTE — Patient Instructions (Signed)
Cancer Center Discharge Instructions for Patients Receiving Chemotherapy  Today you received the following chemotherapy agents:  Herceptin  To help prevent nausea and vomiting after your treatment, we encourage you to take your nausea medication as prescribed.   If you develop nausea and vomiting that is not controlled by your nausea medication, call the clinic.   BELOW ARE SYMPTOMS THAT SHOULD BE REPORTED IMMEDIATELY:  *FEVER GREATER THAN 100.5 F  *CHILLS WITH OR WITHOUT FEVER  NAUSEA AND VOMITING THAT IS NOT CONTROLLED WITH YOUR NAUSEA MEDICATION  *UNUSUAL SHORTNESS OF BREATH  *UNUSUAL BRUISING OR BLEEDING  TENDERNESS IN MOUTH AND THROAT WITH OR WITHOUT PRESENCE OF ULCERS  *URINARY PROBLEMS  *BOWEL PROBLEMS  UNUSUAL RASH Items with * indicate a potential emergency and should be followed up as soon as possible.  Feel free to call the clinic you have any questions or concerns. The clinic phone number is [phone].  Please show the CHEMO ALERT CARD at check-in to the Emergency Department and triage nurse.   

## 2015-12-03 ENCOUNTER — Ambulatory Visit
Admission: RE | Admit: 2015-12-03 | Discharge: 2015-12-03 | Disposition: A | Discharge status: Home / Self Care | Payer: Commercial Managed Care - PPO | Source: Ambulatory Visit | Attending: Radiation Oncology | Admitting: Radiation Oncology

## 2015-12-03 ENCOUNTER — Ambulatory Visit: Admission: RE | Admit: 2015-12-03 | Payer: Commercial Managed Care - PPO | Source: Ambulatory Visit

## 2015-12-06 ENCOUNTER — Ambulatory Visit
Admission: RE | Admit: 2015-12-06 | Discharge: 2015-12-06 | Disposition: A | Discharge status: Home / Self Care | Payer: Commercial Managed Care - PPO | Source: Ambulatory Visit | Attending: Radiation Oncology | Admitting: Radiation Oncology

## 2015-12-06 DIAGNOSIS — Z510 Encounter for antineoplastic radiation therapy: Secondary | ICD-10-CM | POA: Diagnosis not present

## 2015-12-07 ENCOUNTER — Ambulatory Visit: Payer: Commercial Managed Care - PPO | Admitting: Radiation Oncology

## 2015-12-07 ENCOUNTER — Ambulatory Visit: Payer: Commercial Managed Care - PPO

## 2015-12-07 ENCOUNTER — Ambulatory Visit
Admission: RE | Admit: 2015-12-07 | Discharge: 2015-12-07 | Disposition: A | Discharge status: Home / Self Care | Payer: Commercial Managed Care - PPO | Source: Ambulatory Visit | Attending: Radiation Oncology | Admitting: Radiation Oncology

## 2015-12-08 ENCOUNTER — Ambulatory Visit
Admission: RE | Admit: 2015-12-08 | Discharge: 2015-12-08 | Disposition: A | Discharge status: Home / Self Care | Payer: Commercial Managed Care - PPO | Source: Ambulatory Visit | Attending: Radiation Oncology | Admitting: Radiation Oncology

## 2015-12-08 ENCOUNTER — Encounter: Payer: Self-pay | Admitting: Radiation Oncology

## 2015-12-08 VITALS — BP 129/85 | HR 78 | Temp 98.3°F | Ht 64.0 in | Wt 270.7 lb

## 2015-12-08 DIAGNOSIS — C50112 Malignant neoplasm of central portion of left female breast: Secondary | ICD-10-CM

## 2015-12-08 DIAGNOSIS — Z510 Encounter for antineoplastic radiation therapy: Secondary | ICD-10-CM | POA: Diagnosis not present

## 2015-12-08 NOTE — Progress Notes (Signed)
  Radiation Oncology         (336) [phone] ________________________________  Name: Krystal [name] MRN: [id number]  Date: 12/08/2015  DOB: 02-09-1966  Weekly Radiation Therapy Management     ICD-9-CM ICD-10-CM   1. 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 . . . . . . . The patient presents for routine under treatment assessment.                                   Krystal [name] has completed 9 fractions to left chest wall. She denies having pain, but reports fatigue. The skin on her left breast has dermatitis. She does report pruritus of the treatment area. The nurse recommended using hydrocortisone cream prn. She is using radiaplex.                                 Set-up films were reviewed.                                 The chart was checked. Physical Findings. . .  height is '5\' 4"'$  (1.626 m) and weight is 270 lb 11.2 oz (122.789 kg). Her oral temperature is 98.3 F (36.8 C). Her blood pressure is 129/85 and her pulse is 78.   Lungs are clear to auscultation bilaterally. Heart has regular rate and rhythm. Mild erythema in the left chest implant area. Impression . . . . . . . The patient is tolerating radiation. If the patient reports more pruritus, she could swtich from radiaplex to sonafine. Plan . . . . . . . . . . . . Continue treatment as planned. ________________________________   Blair Promise, PhD, MD  This document serves as a record of services personally performed by [name], MD. It was created on his behalf by Darcus Austin, a trained medical scribe. The creation of this record is based on the scribe's personal observations and the provider's statements to them. This document has been checked and approved by the attending provider.

## 2015-12-08 NOTE — Progress Notes (Signed)
Krystal [name] has completed 9 fractions to left chest wall.  She denies having pain.  She reports having fatigue. The skin on her left breast has dermatitis.  She does report having itching.  Recommended using hydrocortisone cream prn.  She is using radiaplex.  BP 129/85 mmHg  Pulse 78  Temp(Src) 98.3 F (36.8 C) (Oral)  Ht 5\' 4"  (1.626 m)  Wt 270 lb 11.2 oz (122.789 kg)  BMI 46.44 kg/m2  LMP 07/06/2014 (Within Weeks)

## 2015-12-09 ENCOUNTER — Ambulatory Visit
Admission: RE | Admit: 2015-12-09 | Discharge: 2015-12-09 | Disposition: A | Discharge status: Home / Self Care | Payer: Commercial Managed Care - PPO | Source: Ambulatory Visit | Attending: Radiation Oncology | Admitting: Radiation Oncology

## 2015-12-09 DIAGNOSIS — Z510 Encounter for antineoplastic radiation therapy: Secondary | ICD-10-CM | POA: Diagnosis not present

## 2015-12-10 ENCOUNTER — Ambulatory Visit
Admission: RE | Admit: 2015-12-10 | Discharge: 2015-12-10 | Disposition: A | Discharge status: Home / Self Care | Payer: Commercial Managed Care - PPO | Source: Ambulatory Visit | Attending: Radiation Oncology | Admitting: Radiation Oncology

## 2015-12-10 DIAGNOSIS — Z510 Encounter for antineoplastic radiation therapy: Secondary | ICD-10-CM | POA: Diagnosis not present

## 2015-12-13 ENCOUNTER — Ambulatory Visit
Admission: RE | Admit: 2015-12-13 | Discharge: 2015-12-13 | Disposition: A | Discharge status: Home / Self Care | Payer: Commercial Managed Care - PPO | Source: Ambulatory Visit | Attending: Radiation Oncology | Admitting: Radiation Oncology

## 2015-12-13 ENCOUNTER — Encounter: Payer: Self-pay | Admitting: Radiation Oncology

## 2015-12-13 VITALS — BP 127/87 | HR 79 | Temp 98.3°F | Ht 64.0 in | Wt 266.6 lb

## 2015-12-13 DIAGNOSIS — C50112 Malignant neoplasm of central portion of left female breast: Secondary | ICD-10-CM

## 2015-12-13 DIAGNOSIS — Z510 Encounter for antineoplastic radiation therapy: Secondary | ICD-10-CM | POA: Diagnosis not present

## 2015-12-13 MED ORDER — ONDANSETRON 4 MG PO TBDP: 4.0000 mg | ORAL_TABLET | Freq: Four times a day (QID) | ORAL | Status: DC | PRN

## 2015-12-13 NOTE — Progress Notes (Addendum)
Krystal [name] has completed 12 fractions to her left chest wall.  She denies having pain today although she is having more frequent sharp pains in her left chest.  She reports having fatigue.  She also reports having queasiness after radiation and is wondering if she can get a refill on Zofran.  The skin on her left chest is red with a rash like appearance on her upper inner chest.  She reports that she is having itching in her left upper chest area.  Recommended using hydrocortisone cream to the itching area as needed. She is using radiaplex.  BP 127/87 mmHg  Pulse 79  Temp(Src) 98.3 F (36.8 C) (Oral)  Ht 5\' 4"  (1.626 m)  Wt 266 lb 9.6 oz (120.929 kg)  BMI 45.74 kg/m2  LMP 07/06/2014 (Within Weeks)   Wt Readings from Last 3 Encounters:  12/13/15 266 lb 9.6 oz (120.929 kg)  12/08/15 270 lb 11.2 oz (122.789 kg)  11/30/15 258 lb 11.2 oz (117.346 kg)

## 2015-12-13 NOTE — Progress Notes (Signed)
  Radiation Oncology         [phone] ________________________________  Name: Krystal [name] MRN: [id number]  Date: 12/13/2015  DOB: 1966/05/08  Weekly Radiation Therapy Management     ICD-9-CM ICD-10-CM   1. 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 . . . . . . . The patient presents for routine under treatment assessment.                                  itching over left breast/implant area. needs refill on Zofran for nausea that comes and goes                                 Set-up films were reviewed.                                 The chart was checked. Physical Findings. . .  height is _0  (1.626 m) and weight is 266 lb 9.6 oz (120.929 kg). Her oral temperature is 98.3 F (36.8 C). Her blood pressure is 127/87 and her pulse is 79.    Mild erythema in the left chest implant area. Early dermatitis in UIQ of left breast/implant  Impression . . . . . . . The patient is tolerating radiation.   Plan . . . . . . . . . . . . Continue treatment as planned. hydrocortisone 1% cream prn itching   Zofran refilled ________________________________   Blair Promise, PhD, MD

## 2015-12-14 ENCOUNTER — Ambulatory Visit: Payer: Commercial Managed Care - PPO | Admitting: Radiation Oncology

## 2015-12-14 ENCOUNTER — Encounter: Payer: Self-pay | Admitting: Radiation Oncology

## 2015-12-14 ENCOUNTER — Ambulatory Visit
Admission: RE | Admit: 2015-12-14 | Discharge: 2015-12-14 | Disposition: A | Discharge status: Home / Self Care | Payer: Commercial Managed Care - PPO | Source: Ambulatory Visit | Attending: Radiation Oncology | Admitting: Radiation Oncology

## 2015-12-14 DIAGNOSIS — Z510 Encounter for antineoplastic radiation therapy: Secondary | ICD-10-CM | POA: Diagnosis not present

## 2015-12-15 ENCOUNTER — Ambulatory Visit
Admission: RE | Admit: 2015-12-15 | Discharge: 2015-12-15 | Disposition: A | Discharge status: Home / Self Care | Payer: Commercial Managed Care - PPO | Source: Ambulatory Visit | Attending: Radiation Oncology | Admitting: Radiation Oncology

## 2015-12-15 DIAGNOSIS — Z510 Encounter for antineoplastic radiation therapy: Secondary | ICD-10-CM | POA: Diagnosis not present

## 2015-12-16 ENCOUNTER — Ambulatory Visit
Admission: RE | Admit: 2015-12-16 | Discharge: 2015-12-16 | Disposition: A | Discharge status: Home / Self Care | Payer: Commercial Managed Care - PPO | Source: Ambulatory Visit | Attending: Radiation Oncology | Admitting: Radiation Oncology

## 2015-12-16 DIAGNOSIS — Z510 Encounter for antineoplastic radiation therapy: Secondary | ICD-10-CM | POA: Diagnosis not present

## 2015-12-17 ENCOUNTER — Ambulatory Visit
Admission: RE | Admit: 2015-12-17 | Discharge: 2015-12-17 | Disposition: A | Discharge status: Home / Self Care | Payer: Commercial Managed Care - PPO | Source: Ambulatory Visit | Attending: Radiation Oncology | Admitting: Radiation Oncology

## 2015-12-17 DIAGNOSIS — Z510 Encounter for antineoplastic radiation therapy: Secondary | ICD-10-CM | POA: Diagnosis not present

## 2015-12-18 ENCOUNTER — Ambulatory Visit: Payer: Commercial Managed Care - PPO

## 2015-12-20 ENCOUNTER — Ambulatory Visit
Admission: RE | Admit: 2015-12-20 | Discharge: 2015-12-20 | Disposition: A | Discharge status: Home / Self Care | Payer: Commercial Managed Care - PPO | Source: Ambulatory Visit | Attending: Radiation Oncology | Admitting: Radiation Oncology

## 2015-12-20 DIAGNOSIS — Z510 Encounter for antineoplastic radiation therapy: Secondary | ICD-10-CM | POA: Diagnosis not present

## 2015-12-21 ENCOUNTER — Ambulatory Visit
Admission: RE | Admit: 2015-12-21 | Discharge: 2015-12-21 | Disposition: A | Discharge status: Home / Self Care | Payer: Commercial Managed Care - PPO | Source: Ambulatory Visit | Attending: Radiation Oncology | Admitting: Radiation Oncology

## 2015-12-21 DIAGNOSIS — C50112 Malignant neoplasm of central portion of left female breast: Secondary | ICD-10-CM | POA: Diagnosis not present

## 2015-12-21 DIAGNOSIS — Z510 Encounter for antineoplastic radiation therapy: Secondary | ICD-10-CM | POA: Diagnosis not present

## 2015-12-21 MED ORDER — RADIAPLEXRX EX GEL
Freq: Once | CUTANEOUS | Status: AC
  Administered 2015-12-21: 16:00:00 via TOPICAL

## 2015-12-21 NOTE — Addendum Note (Signed)
Encounter addended by: Jacqulyn Liner, RN on: 12/21/2015  3:42 PM<BR>     Documentation filed: Inpatient MAR

## 2015-12-21 NOTE — Progress Notes (Signed)
  Radiation Oncology         [phone] ________________________________  Name: Krystal [name] MRN: [id number]  Date: 12/21/2015  DOB: [birth year]  Weekly Radiation Therapy Management  . 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 . . . . . . . The patient presents for routine under treatment assessment.                                   Occasional sharp pains within the breast but no consistent pain. She has minimal itching at this time does report some fatigue.                                 Set-up films were reviewed.                                 The chart was checked. Physical Findings. . . The lungs are clear. The heart has a regular rhythm and rate. Examination of left chest area reveals some mild erythema particularly in the upper inner aspect. Impression . . . . . . . The patient is tolerating radiation. Plan . . . . . . . . . . . . Continue treatment as planned.  ________________________________   Blair Promise, PhD, MD

## 2015-12-22 ENCOUNTER — Ambulatory Visit: Admission: RE | Admit: 2015-12-22 | Payer: Commercial Managed Care - PPO | Source: Ambulatory Visit

## 2015-12-22 ENCOUNTER — Ambulatory Visit
Admission: RE | Admit: 2015-12-22 | Discharge: 2015-12-22 | Disposition: A | Discharge status: Home / Self Care | Payer: Commercial Managed Care - PPO | Source: Ambulatory Visit | Attending: Radiation Oncology | Admitting: Radiation Oncology

## 2015-12-22 DIAGNOSIS — Z510 Encounter for antineoplastic radiation therapy: Secondary | ICD-10-CM | POA: Diagnosis not present

## 2015-12-23 ENCOUNTER — Ambulatory Visit (HOSPITAL_BASED_OUTPATIENT_CLINIC_OR_DEPARTMENT_OTHER): Payer: Commercial Managed Care - PPO | Admitting: Hematology and Oncology

## 2015-12-23 ENCOUNTER — Ambulatory Visit (HOSPITAL_BASED_OUTPATIENT_CLINIC_OR_DEPARTMENT_OTHER): Payer: Commercial Managed Care - PPO

## 2015-12-23 ENCOUNTER — Other Ambulatory Visit (HOSPITAL_BASED_OUTPATIENT_CLINIC_OR_DEPARTMENT_OTHER): Payer: Commercial Managed Care - PPO

## 2015-12-23 ENCOUNTER — Telehealth: Payer: Self-pay | Admitting: Hematology and Oncology

## 2015-12-23 ENCOUNTER — Ambulatory Visit
Admission: RE | Admit: 2015-12-23 | Discharge: 2015-12-23 | Disposition: A | Discharge status: Home / Self Care | Payer: Commercial Managed Care - PPO | Source: Ambulatory Visit | Attending: Radiation Oncology | Admitting: Radiation Oncology

## 2015-12-23 ENCOUNTER — Encounter: Payer: Self-pay | Admitting: Hematology and Oncology

## 2015-12-23 VITALS — BP 112/81 | HR 84 | Temp 98.6°F | Resp 18 | Wt 262.5 lb

## 2015-12-23 DIAGNOSIS — C50112 Malignant neoplasm of central portion of left female breast: Secondary | ICD-10-CM | POA: Diagnosis not present

## 2015-12-23 DIAGNOSIS — Z5112 Encounter for antineoplastic immunotherapy: Secondary | ICD-10-CM

## 2015-12-23 DIAGNOSIS — Z510 Encounter for antineoplastic radiation therapy: Secondary | ICD-10-CM | POA: Diagnosis not present

## 2015-12-23 DIAGNOSIS — G2581 Restless legs syndrome: Secondary | ICD-10-CM

## 2015-12-23 LAB — COMPREHENSIVE METABOLIC PANEL
ALT: 56 U/L — ABNORMAL HIGH (ref 0–55)
AST: 32 U/L (ref 5–34)
Albumin: 3.8 g/dL (ref 3.5–5.0)
Alkaline Phosphatase: 80 U/L (ref 40–150)
Anion Gap: 8 mEq/L (ref 3–11)
BUN: 9.3 mg/dL (ref 7.0–26.0)
CO2: 28 mEq/L (ref 22–29)
Calcium: 9.1 mg/dL (ref 8.4–10.4)
Chloride: 105 mEq/L (ref 98–109)
Creatinine: 0.8 mg/dL (ref 0.6–1.1)
EGFR: >90 mL/min/{1.73_m2} (ref >90)
Glucose: 130 mg/dl (ref 70–140)
Potassium: 4.1 mEq/L (ref 3.5–5.1)
Sodium: 142 mEq/L (ref 136–145)
Total Bilirubin: 0.59 mg/dL (ref 0.20–1.20)
Total Protein: 7.3 g/dL (ref 6.4–8.3)

## 2015-12-23 LAB — CBC WITH DIFFERENTIAL/PLATELET
BASO%: 0.4 % (ref 0.0–2.0)
Basophils Absolute: 0 10*3/uL (ref 0.0–0.1)
EOS%: 5 % (ref 0.0–7.0)
Eosinophils Absolute: 0.2 10*3/uL (ref 0.0–0.5)
HCT: 39.8 % (ref 34.8–46.6)
HGB: 13.3 g/dL (ref 11.6–15.9)
LYMPH%: 19.9 % (ref 14.0–49.7)
MCH: 30 pg (ref 25.1–34.0)
MCHC: 33.4 g/dL (ref 31.5–36.0)
MCV: 89.8 fL (ref 79.5–101.0)
MONO#: 0.3 10*3/uL (ref 0.1–0.9)
MONO%: 6.1 % (ref 0.0–14.0)
NEUT#: 3.1 10*3/uL (ref 1.5–6.5)
NEUT%: 68.6 % (ref 38.4–76.8)
Platelets: 221 10*3/uL (ref 145–400)
RBC: 4.43 10*6/uL (ref 3.70–5.45)
RDW: 13.6 % (ref 11.2–14.5)
WBC: 4.6 10*3/uL (ref 3.9–10.3)
lymph#: 0.9 10*3/uL (ref 0.9–3.3)

## 2015-12-23 MED ORDER — DIPHENHYDRAMINE HCL 25 MG PO CAPS
ORAL_CAPSULE | ORAL | Status: AC
  Filled 2015-12-23: qty 2

## 2015-12-23 MED ORDER — ACETAMINOPHEN 325 MG PO TABS
650.0000 mg | ORAL_TABLET | Freq: Once | ORAL | Status: AC
  Administered 2015-12-23: 650 mg via ORAL

## 2015-12-23 MED ORDER — TRASTUZUMAB CHEMO INJECTION 440 MG
6.0000 mg/kg | Freq: Once | INTRAVENOUS | Status: AC
  Administered 2015-12-23: 693 mg via INTRAVENOUS
  Filled 2015-12-23: qty 33

## 2015-12-23 MED ORDER — DIPHENHYDRAMINE HCL 25 MG PO CAPS
50.0000 mg | ORAL_CAPSULE | Freq: Once | ORAL | Status: AC
  Administered 2015-12-23: 50 mg via ORAL

## 2015-12-23 MED ORDER — HEPARIN SOD (PORK) LOCK FLUSH 100 UNIT/ML IV SOLN
500.0000 [IU] | Freq: Once | INTRAVENOUS | Status: AC | PRN
  Administered 2015-12-23: 500 [IU]
  Filled 2015-12-23: qty 5

## 2015-12-23 MED ORDER — ACETAMINOPHEN 325 MG PO TABS
ORAL_TABLET | ORAL | Status: AC
  Filled 2015-12-23: qty 2

## 2015-12-23 MED ORDER — SODIUM CHLORIDE 0.9 % IV SOLN
Freq: Once | INTRAVENOUS | Status: AC
  Administered 2015-12-23: 11:00:00 via INTRAVENOUS

## 2015-12-23 MED ORDER — SODIUM CHLORIDE 0.9% FLUSH
10.0000 mL | INTRAVENOUS | Status: DC | PRN
  Administered 2015-12-23: 10 mL
  Filled 2015-12-23: qty 10

## 2015-12-23 NOTE — Assessment & Plan Note (Signed)
T1 C. N0 M0 stage IA ER 100%, PR 95%, HER-2 negative ratio 1.9, Ki-67 15% with additional satellite nodule 0.6 cm: Oncotype DX recurrence score is 13 (9% risk of recurrence)  Antiestrogen therapy: Patient took tamoxifen from 05/26/2014 to 06/07/2014 and stopped it because of itching. Patient had undergone oophorectomy with TAH for reduction of risk of ovarian cancer. She never resumed anastrozole. She decided on her own to not take antiestrogen therapy at that time.  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 Adjuvant radiation started 11/24/2015 2. Herceptin every 3 weeks started 11/08/2015  Herceptin toxicities: 1. First infusion reaction with shaking chills and rigors 2. Monitoring every 3 months with echocardiograms  Return to clinic in 6 weeks for follow-up

## 2015-12-23 NOTE — Patient Instructions (Signed)
[facility] Discharge Instructions for Patients Receiving Chemotherapy  Today you received the following chemotherapy agents:  Herceptin  To help prevent nausea and vomiting after your treatment, we encourage you to take your nausea medication as prescribed.   If you develop nausea and vomiting that is not controlled by your nausea medication, call the clinic.   BELOW ARE SYMPTOMS THAT SHOULD BE REPORTED IMMEDIATELY:  *FEVER GREATER THAN 100.5 F  *CHILLS WITH OR WITHOUT FEVER  NAUSEA AND VOMITING THAT IS NOT CONTROLLED WITH YOUR NAUSEA MEDICATION  *UNUSUAL SHORTNESS OF BREATH  *UNUSUAL BRUISING OR BLEEDING  TENDERNESS IN MOUTH AND THROAT WITH OR WITHOUT PRESENCE OF ULCERS  *URINARY PROBLEMS  *BOWEL PROBLEMS  UNUSUAL RASH Items with * indicate a potential emergency and should be followed up as soon as possible.  Feel free to call the clinic you have any questions or concerns. The clinic phone number is [phone].  Please show the CHEMO ALERT CARD at check-in to the Emergency Department and triage nurse.   

## 2015-12-23 NOTE — Progress Notes (Signed)
Patient Care Team: [name] as PCP - General (General Practice)  DIAGNOSIS: Cancer of central portion of left female breast ([place])   Staging form: Breast, AJCC 7th Edition     Clinical: No stage assigned - Unsigned     Pathologic: Stage IA (T1c, N0, cM0) - Signed by Rulon Eisenmenger, MD on 05/12/2014   SUMMARY OF ONCOLOGIC HISTORY:   Cancer of central portion of left female breast ([place])   04/16/2014 Surgery Bilateral mastectomies: Left breast : Multifocal invasive ductal carcinoma positive for lymphovascular invasion, 1.2 cm and 0.6 cm, grade 3, high-grade DCIS with comedonecrosis, 4 SLN negative T1 C. N0 M0 stage IA: Oncotype DX 13 (ROR 9%)   04/16/2014 Surgery Left upper back melanoma resected no residual melanoma identified on re-resection margins negative   05/26/2014 - 06/08/2015 Anti-estrogen oral therapy Tamoxifen 20 mg daily with a plan to switch her to aromatase inhibitors once she gets oophorectomy ( patient did not continue antiestrogen therapy by choice)   08/13/2014 Surgery oophorectomy with total abdominal hysterectomy   09/08/2015 -  Anti-estrogen oral therapy Resumed anastrozole after she found recurrence of breast cancer   09/16/2015 Surgery Skin, left: IDC grade 3; 0.5 cm, margins negative, ER 95%, PR 10%, HER-2 positive ratio 2.08   11/08/2015 -  Chemotherapy Herceptin every 3 weeks plus anastrozole    CHIEF COMPLIANT: Follow-up on Herceptin.  INTERVAL HISTORY: Krystal [name] is a 49 year old with above-mentioned history of relapsed breast cancer currently on Herceptin. She is also on radiation. She is very fatigued from the effect of radiation. She tolerated Herceptin reasonably well. However because of steroid she had tremors and restless legs for couple of days.  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  I have reviewed the past medical history, past surgical history, social history and family history with the patient and they are unchanged from previous note.  ALLERGIES:  is allergic to dilaudid and codeine.  MEDICATIONS:  Current Outpatient Prescriptions  Medication Sig Dispense Refill  . Alum Hydroxide-Mag Carbonate (GAVISCON PO) Take 2 tablets by mouth daily as needed (for heartburn).     Marland Kitchen anastrozole (ARIMIDEX) 1 MG tablet TAKE 1 TABLET (1 MG TOTAL) BY MOUTH DAILY. (Patient not taking: Reported on 11/30/2015) 30 tablet 1  . fluocinonide cream (LIDEX) 0.05 % Reported on 10/07/2015    . hyaluronate sodium (RADIAPLEXRX) GEL Apply 1 application topically once.    . Ibuprofen-Diphenhydramine Cit (ADVIL PM PO) Take by mouth.    . lidocaine-prilocaine (EMLA) cream Apply 1 application topically as needed. 30 g 1  . lidocaine-prilocaine (EMLA) cream Apply to affected area once 30 g 3  . mupirocin ointment (BACTROBAN) 2 %     . non-metallic deodorant (ALRA) MISC Apply 1 application topically daily as needed.    . ondansetron (ZOFRAN-ODT) 4 MG disintegrating tablet Take 1 tablet (4 mg total) by mouth every 6 (six) hours as needed. Reported on 12/13/2015 20 tablet 5  . oxyCODONE (OXY IR/ROXICODONE) 5 MG  immediate release tablet Take 1-2 tablets (5-10 mg total) by mouth every 6 (six) hours as needed for moderate pain, severe pain or breakthrough pain. (Patient not taking: Reported on 12/13/2015) 15 tablet 0  . permethrin (ELIMITE) 5 % cream Reported on 12/08/2015     No  current facility-administered medications for this visit.    PHYSICAL EXAMINATION: ECOG PERFORMANCE STATUS: 1 - Symptomatic but completely ambulatory  Filed Vitals:   12/23/15 0945  BP: 112/81  Pulse: 84  Temp: 98.6 F (37 C)  Resp: 18   Filed Weights   12/23/15 0945  Weight: 262 lb 8 oz (119.069 kg)    GENERAL:alert, no distress and comfortable SKIN: skin color, texture, turgor are normal, no rashes or significant lesions EYES: normal, Conjunctiva are pink and non-injected, sclera clear OROPHARYNX:no exudate, no erythema and lips, buccal mucosa, and tongue normal  NECK: supple, thyroid normal size, non-tender, without nodularity LYMPH:  no palpable lymphadenopathy in the cervical, axillary or inguinal LUNGS: clear to auscultation and percussion with normal breathing effort HEART: regular rate & rhythm and no murmurs and no lower extremity edema ABDOMEN:abdomen soft, non-tender and normal bowel sounds MUSCULOSKELETAL:no cyanosis of digits and no clubbing  NEURO: alert & oriented x 3 with fluent speech, no focal motor/sensory deficits EXTREMITIES: No lower extremity edema  LABORATORY DATA:  I have reviewed the data as listed   Chemistry      Component Value Date/Time   NA 144 11/08/2015 1147   NA 140 04/14/2014 1108   K 4.4 11/08/2015 1147   K 4.9 04/14/2014 1108   CL 100 04/14/2014 1108   CO2 30* 11/08/2015 1147   CO2 26 04/14/2014 1108   BUN 10.1 11/08/2015 1147   BUN 9 04/14/2014 1108   CREATININE 0.8 11/08/2015 1147   CREATININE 0.70 04/14/2014 1108      Component Value Date/Time   CALCIUM 9.8 11/08/2015 1147   CALCIUM 9.0 04/14/2014 1108   ALKPHOS 84 11/08/2015 1147   ALKPHOS 68 04/14/2014 1108   AST 20 11/08/2015 1147   AST 14 04/14/2014 1108   ALT 37 11/08/2015 1147   ALT 15 04/14/2014 1108   BILITOT 0.42 11/08/2015 1147   BILITOT 0.2* 04/14/2014 1108       Lab Results  Component Value Date   WBC 4.6 12/23/2015   HGB 13.3 12/23/2015   HCT 39.8  12/23/2015   MCV 89.8 12/23/2015   PLT 221 12/23/2015   NEUTROABS 3.1 12/23/2015     ASSESSMENT & PLAN:  Cancer of central portion of left female breast T1 C. N0 M0 stage IA ER 100%, PR 95%, HER-2 negative ratio 1.9, Ki-67 15% with additional satellite nodule 0.6 cm: Oncotype DX recurrence score is 13 (9% risk of recurrence)  Antiestrogen therapy: Patient took tamoxifen from 05/26/2014 to 06/07/2014 and stopped it because of itching. Patient had undergone oophorectomy with TAH for reduction of risk of ovarian cancer. She never resumed anastrozole. She decided on her own to not take antiestrogen therapy at that time.  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 Adjuvant radiation started 11/24/2015 2. Herceptin every 3 weeks started 11/08/2015 (anastrozole is on hold until radiation is complete)  Herceptin toxicities: 1. First infusion reaction with shaking chills and rigors 2. Monitoring every 3 months with echocardiograms I would discontinue steroids because of steroids make her extremely restless  Return to clinic  in 6 weeks for follow-up   No orders of the defined types were placed in this encounter.   The patient has a good understanding of the overall plan. she agrees with it. she will call with any problems that may develop before the next visit here.   Rulon Eisenmenger, MD 12/23/2015

## 2015-12-23 NOTE — Telephone Encounter (Signed)
appt made and avs printed °

## 2015-12-24 ENCOUNTER — Ambulatory Visit
Admission: RE | Admit: 2015-12-24 | Discharge: 2015-12-24 | Disposition: A | Discharge status: Home / Self Care | Payer: Commercial Managed Care - PPO | Source: Ambulatory Visit | Attending: Radiation Oncology | Admitting: Radiation Oncology

## 2015-12-24 DIAGNOSIS — Z510 Encounter for antineoplastic radiation therapy: Secondary | ICD-10-CM | POA: Diagnosis not present

## 2015-12-27 ENCOUNTER — Ambulatory Visit
Admission: RE | Admit: 2015-12-27 | Discharge: 2015-12-27 | Disposition: A | Discharge status: Home / Self Care | Payer: Commercial Managed Care - PPO | Source: Ambulatory Visit | Attending: Radiation Oncology | Admitting: Radiation Oncology

## 2015-12-27 DIAGNOSIS — Z510 Encounter for antineoplastic radiation therapy: Secondary | ICD-10-CM | POA: Diagnosis not present

## 2015-12-28 ENCOUNTER — Ambulatory Visit
Admission: RE | Admit: 2015-12-28 | Discharge: 2015-12-28 | Disposition: A | Discharge status: Home / Self Care | Payer: Commercial Managed Care - PPO | Source: Ambulatory Visit | Attending: Radiation Oncology | Admitting: Radiation Oncology

## 2015-12-28 ENCOUNTER — Encounter: Payer: Self-pay | Admitting: Radiation Oncology

## 2015-12-28 VITALS — BP 133/76 | HR 88 | Temp 98.0°F | Resp 16 | Wt 261.0 lb

## 2015-12-28 DIAGNOSIS — C50112 Malignant neoplasm of central portion of left female breast: Secondary | ICD-10-CM

## 2015-12-28 DIAGNOSIS — Z510 Encounter for antineoplastic radiation therapy: Secondary | ICD-10-CM | POA: Diagnosis not present

## 2015-12-28 NOTE — Progress Notes (Signed)
  Radiation Oncology         [phone] ________________________________  Name: Krystal [name] MRN: [id number]  Date: 12/28/2015  DOB: [birth year]  Electron Simulation Verification Note    ICD-9-CM ICD-10-CM   1. Cancer of central portion of left female breast ([place]) 174.1 C50.112     Status: outpatient  NARRATIVE: The patient was brought to the treatment unit and placed in the planned treatment position. The clinical setup was verified. Then port films were obtained and uploaded to the radiation oncology medical record software.  The treatment beams were carefully compared against the planned radiation fields. The position location and shape of the radiation fields was reviewed. They targeted volume of tissue appears to be appropriately covered by the radiation beams. Organs at risk appear to be excluded as planned.  Based on my personal review, I approved the simulation verification. The patient's treatment will proceed as planned.  -----------------------------------  Blair Promise, PhD, MD
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
## 2015-12-28 NOTE — Progress Notes (Signed)
  Radiation Oncology         [phone] ________________________________  Name: Krystal [name] MRN: [id number]  Date: 12/28/2015  DOB: 10-21-1965  Weekly Radiation Therapy Management    ICD-9-CM ICD-10-CM   1. 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 . . . . . . . The patient presents for routine under treatment assessment.                                   The patient is Having soreness/discomfort particularly in the low axillary area. She is also quite fatigued at this time.  No suicidal ideation at this time.                                  Set-up films were reviewed.                                 The chart was checked. Physical Findings. . .  weight is 261 lb (118.389 kg). Her temperature is 98 F (36.7 C). Her blood pressure is 133/76 and her pulse is 88. Her respiration is 16. . The lungs are clear. The heart has a regular rhythm and rate. The left chest area shows erythema and hyperpigmentation changes. No moist desquamation at this time. Impression . . . . . . . The patient is tolerating radiation. Plan . . . . . . . . . . . . Continue treatment as planned.  ________________________________   Blair Promise, PhD, MD

## 2015-12-29 ENCOUNTER — Ambulatory Visit
Admission: RE | Admit: 2015-12-29 | Discharge: 2015-12-29 | Disposition: A | Discharge status: Home / Self Care | Payer: Commercial Managed Care - PPO | Source: Ambulatory Visit | Attending: Radiation Oncology | Admitting: Radiation Oncology

## 2015-12-29 ENCOUNTER — Ambulatory Visit: Payer: Commercial Managed Care - PPO

## 2015-12-29 DIAGNOSIS — Z510 Encounter for antineoplastic radiation therapy: Secondary | ICD-10-CM | POA: Diagnosis not present

## 2015-12-30 ENCOUNTER — Ambulatory Visit (HOSPITAL_COMMUNITY)
Admission: EM | Admit: 2015-12-30 | Discharge: 2015-12-30 | Disposition: A | Discharge status: Home / Self Care | Payer: Commercial Managed Care - PPO | Attending: Family Medicine | Admitting: Family Medicine

## 2015-12-30 ENCOUNTER — Ambulatory Visit
Admission: RE | Admit: 2015-12-30 | Discharge: 2015-12-30 | Disposition: A | Discharge status: Home / Self Care | Payer: Commercial Managed Care - PPO | Source: Ambulatory Visit | Attending: Radiation Oncology | Admitting: Radiation Oncology

## 2015-12-30 ENCOUNTER — Encounter (HOSPITAL_COMMUNITY): Payer: Self-pay | Admitting: Emergency Medicine

## 2015-12-30 ENCOUNTER — Ambulatory Visit (INDEPENDENT_AMBULATORY_CARE_PROVIDER_SITE_OTHER): Payer: Commercial Managed Care - PPO

## 2015-12-30 ENCOUNTER — Telehealth: Payer: Self-pay | Admitting: *Deleted

## 2015-12-30 ENCOUNTER — Ambulatory Visit: Payer: Commercial Managed Care - PPO

## 2015-12-30 DIAGNOSIS — Z510 Encounter for antineoplastic radiation therapy: Secondary | ICD-10-CM | POA: Diagnosis not present

## 2015-12-30 DIAGNOSIS — K219 Gastro-esophageal reflux disease without esophagitis: Secondary | ICD-10-CM | POA: Diagnosis not present

## 2015-12-30 DIAGNOSIS — M79676 Pain in unspecified toe(s): Secondary | ICD-10-CM | POA: Diagnosis present

## 2015-12-30 DIAGNOSIS — Z79899 Other long term (current) drug therapy: Secondary | ICD-10-CM | POA: Diagnosis not present

## 2015-12-30 DIAGNOSIS — Z885 Allergy status to narcotic agent status: Secondary | ICD-10-CM | POA: Insufficient documentation

## 2015-12-30 DIAGNOSIS — Z853 Personal history of malignant neoplasm of breast: Secondary | ICD-10-CM | POA: Diagnosis not present

## 2015-12-30 DIAGNOSIS — Z79811 Long term (current) use of aromatase inhibitors: Secondary | ICD-10-CM | POA: Insufficient documentation

## 2015-12-30 DIAGNOSIS — Z87891 Personal history of nicotine dependence: Secondary | ICD-10-CM | POA: Insufficient documentation

## 2015-12-30 DIAGNOSIS — M1000 Idiopathic gout, unspecified site: Secondary | ICD-10-CM | POA: Insufficient documentation

## 2015-12-30 LAB — URIC ACID: Uric Acid, Serum: 7.5 mg/dL — ABNORMAL HIGH (ref 2.3–6.6)

## 2015-12-30 MED ORDER — PREDNISONE 50 MG PO TABS: ORAL_TABLET | ORAL | Status: DC

## 2015-12-30 MED ORDER — COLCHICINE 0.6 MG PO TABS: 0.6000 mg | ORAL_TABLET | Freq: Two times a day (BID) | ORAL | Status: DC

## 2015-12-30 NOTE — ED Notes (Signed)
C/o pain on 2nd left toe onset x2 days associated w/redness, sweeling, tender, localized fever Denies inj/trauma, strenuous activity  Reports she is being treated for left breast cancer and has radiation therapy weekly Slow gait... A&O x4... No acute distress.

## 2015-12-30 NOTE — Discharge Instructions (Signed)
Use medicines as prescribed and dr office will call for follow up  appt.

## 2015-12-30 NOTE — ED Provider Notes (Signed)
CSN: [id number]     Arrival date & time 12/30/15  1343 History   First MD Initiated Contact with Patient 12/30/15 1452     Chief Complaint  Patient presents with  . Toe Pain   (Consider location/radiation/quality/duration/timing/severity/associated sxs/prior Treatment) Patient is a 50 y.o. female presenting with toe pain. The history is provided by the patient.  Toe Pain This is a new problem. The current episode started 2 days ago. The problem occurs constantly. The problem has not changed since onset.   Past Medical History  Diagnosis Date  . BRCA1 positive     BRCA1 [id number]  . Arthritis     shoulders  . GERD (gastroesophageal reflux disease)   . History of breast cancer 2015  . Complication of anesthesia     respiratory depression/arrest with Dilaudid  . PONV (postoperative nausea and vomiting)   . Dental crowns present   . Cough with sputum 09/10/2015    clear sputum, per pt.  . Stuffy and runny nose 09/10/2015    green drainage from nose, per pt.  . Chest wall mass 09/2015   Past Surgical History  Procedure Laterality Date  . Cesarean section  1999; 2001; 2005  . Colonoscopy    . Melanoma excision N/A 04/16/2014    Procedure: WIDE LOCAL EXCISION OF BACK MELANOMA;  Surgeon: Rolm Bookbinder, MD;  Location: [place];  Service: General;  Laterality: N/A;  . Simple mastectomy with axillary sentinel node biopsy Bilateral 04/16/2014    Procedure: BILATERAL SKIN SPARING  MASTECTOMIES WITH LEFT AXILLARY SENTINEL NODE BIOPSY;  Surgeon: Rolm Bookbinder, MD;  Location: [place];  Service: General;  Laterality: Bilateral;  . Breast reconstruction with placement of tissue expander and flex hd (acellular hydrated dermis) Bilateral 04/16/2014    Procedure: BILATERAL BREAST RECONSTRUCTION WITH PLACEMENT OF TISSUE EXPANDER AND FLEX HD (ACELLULAR HYDRATED DERMIS);  Surgeon: Irene Limbo, MD;  Location: [place];  Service: Plastics;   Laterality: Bilateral;  . Debridement and closure wound Bilateral 05/15/2014    Procedure: DEBRIDEMENT AND CLOSURE OF BILATERAL MASECTOMY INCISIONS WITH BREAST EXPANSION;  Surgeon: Irene Limbo, MD;  Location: [place];  Service: Plastics;  Laterality: Bilateral;  . Cholecystectomy  1995  . Laparoscopic assisted vaginal hysterectomy N/A 08/13/2014    Procedure: OPEN LAPAROSCOPY, EXPLORATORY LAPAROTOMY, TOTAL ABDOMINAL HYSTERECTOMY WITH BILATERAL SALPINGECTOMY AND BILATERAL OOPHORECTOMY;  Surgeon: Darlyn Chamber, MD;  Location: [place];  Service: Gynecology;  Laterality: N/A;  . Removal of bilateral tissue expanders with placement of bilateral breast implants Bilateral 11/13/2014    Procedure: REMOVAL OF BILATERAL TISSUE EXPANDERS WITH PLACEMENT OF BILATERAL SILICONE IMPLANTS ;  Surgeon: Irene Limbo, MD;  Location: [place];  Service: Plastics;  Laterality: Bilateral;  . Liposuction with lipofilling Bilateral 11/13/2014    Procedure: LIPOFILLING TO BILATERAL CHEST ;  Surgeon: Irene Limbo, MD;  Location: [place];  Service: Plastics;  Laterality: Bilateral;  . Breast lumpectomy Left 09/16/2015    Procedure: LEFT BREAST MASS EXCISION;  Surgeon: Rolm Bookbinder, MD;  Location: [place];  Service: General;  Laterality: Left;  . Abdominal hysterectomy    . Portacath placement Right 10/21/2015    Procedure: INSERTION PORT-A-CATH WITH ULTRASOUND ;  Surgeon: Rolm Bookbinder, MD;  Location: [place];  Service: General;  Laterality: Right;   Family History  Problem Relation Age of Onset  . Breast cancer Paternal Aunt 57    deceased 63s  .  Breast cancer Paternal Grandmother     dx 59s; ? if had 2nd BC in 97s; deceased 61s  . Pancreatic cancer Paternal Uncle 31    smoker; deceased   Social History  Substance Use Topics  . Smoking status: Former Smoker -- 0.00 packs/day for 0 years    Quit  date: 04/17/2014  . Smokeless tobacco: Never Used  . Alcohol Use: Yes     Comment: occasionally   OB History    No data available     Review of Systems  Constitutional: Negative.   Musculoskeletal: Positive for joint swelling.  Skin: Positive for color change.  All other systems reviewed and are negative.   Allergies  Dilaudid and Codeine  Home Medications   Prior to Admission medications   Medication Sig Start Date End Date Taking? Authorizing Provider  Alum Hydroxide-Mag Carbonate (GAVISCON PO) Take 2 tablets by mouth daily as needed (for heartburn).     Historical Provider, MD  anastrozole (ARIMIDEX) 1 MG tablet TAKE 1 TABLET (1 MG TOTAL) BY MOUTH DAILY. Patient not taking: Reported on 11/30/2015 11/01/15   [name], MD  colchicine 0.6 MG tablet Take 1 tablet (0.6 mg total) by mouth 2 (two) times daily. 12/30/15   Billy Fischer, MD  fluocinonide cream (LIDEX) 0.05 % Reported on 12/28/2015 03/10/15   Historical Provider, MD  hyaluronate sodium (RADIAPLEXRX) GEL Apply 1 application topically once.    Historical Provider, MD  Ibuprofen-Diphenhydramine Cit (ADVIL PM PO) Take by mouth.    Historical Provider, MD  lidocaine-prilocaine (EMLA) cream Apply 1 application topically as needed. 10/26/15   [name], MD  lidocaine-prilocaine (EMLA) cream Apply to affected area once Patient not taking: Reported on 12/28/2015 11/01/15   [name], MD  non-metallic deodorant Jethro Poling) MISC Apply 1 application topically daily as needed.    Historical Provider, MD  ondansetron (ZOFRAN-ODT) 4 MG disintegrating tablet Take 1 tablet (4 mg total) by mouth every 6 (six) hours as needed. Reported on 12/13/2015 12/13/15   Eppie Gibson, MD  oxyCODONE (OXY IR/ROXICODONE) 5 MG immediate release tablet Take 1-2 tablets (5-10 mg total) by mouth every 6 (six) hours as needed for moderate pain, severe pain or breakthrough pain. Patient not taking: Reported on 12/13/2015 10/21/15   Rolm Bookbinder, MD  permethrin  Nancy Fetter) 5 % cream Reported on 12/28/2015 03/10/15   Historical Provider, MD  predniSONE (DELTASONE) 50 MG tablet 1 tab daily for 2 days then 1/2 tab daily for 2 days. Start today. 12/30/15   Billy Fischer, MD   Meds Ordered and Administered this Visit  Medications - No data to display  BP 116/66 mmHg  Pulse 77  Temp(Src) 98.8 F (37.1 C) (Oral)  Resp 16  SpO2 100%  LMP 07/06/2014 (Within Weeks) No data found.   Physical Exam  Constitutional: She is oriented to person, place, and time. She appears well-developed and well-nourished.  Musculoskeletal: She exhibits edema and tenderness.       Feet:  Neurological: She is alert and oriented to person, place, and time.  Skin: Skin is warm.  Nursing note and vitals reviewed.   ED Course  Procedures (including critical care time)  Labs Review Labs Reviewed  URIC ACID - Abnormal; Notable for the following:    Uric Acid, Serum 7.5 (*)    All other components within normal limits    Imaging Review Dg Foot Complete Left  12/30/2015  CLINICAL DATA:  Swelling of the left second toe several days ago with  redness, history of breast carcinoma EXAM: LEFT FOOT - COMPLETE 3+ VIEW COMPARISON:  None. FINDINGS: Tarsal-metatarsal alignment is normal. No fracture is seen. No erosion is noted. Joint spaces appear normal. There are small degenerative calcaneal spurs present. IMPRESSION: No erosion.  No acute abnormality.  Calcaneal degenerative spurs. Electronically Signed   By: Ivar Drape M.D.   On: 12/30/2015 15:34   X-rays reviewed and report per radiologist.   Visual Acuity Review  Right Eye Distance:   Left Eye Distance:   Bilateral Distance:    Right Eye Near:   Left Eye Near:    Bilateral Near:         MDM   1. Acute idiopathic gout, unspecified site    Discussed with dr Lindi Adie, short course of pred and colchicine advised, he will call pt for f/u.    Billy Fischer, MD 12/30/15 [phone]

## 2015-12-30 NOTE — Telephone Encounter (Signed)
She can see Jenny Reichmann if Jenny Reichmann has space on her schedule. VG

## 2015-12-30 NOTE — Telephone Encounter (Signed)
Spoke with Selena Lesser, she recommended the patient be seen at an urgent care or PCP if possible. I spoke with patient and she has decided to go to the urgent care.

## 2015-12-30 NOTE — Telephone Encounter (Signed)
Patient called stating that she 2 days ago she noticed redness/pain/swelling of second left toe. Patient states that she doesn't know if anything possibly bite her, but she would like to know if there is something she can do about it. She will try to get an appointment set up with her dermatologist. Message forwarded to MD Jovita Gamma

## 2015-12-31 ENCOUNTER — Other Ambulatory Visit: Payer: Self-pay | Admitting: *Deleted

## 2015-12-31 ENCOUNTER — Ambulatory Visit
Admission: RE | Admit: 2015-12-31 | Discharge: 2015-12-31 | Disposition: A | Discharge status: Home / Self Care | Payer: Commercial Managed Care - PPO | Source: Ambulatory Visit | Attending: Radiation Oncology | Admitting: Radiation Oncology

## 2015-12-31 ENCOUNTER — Telehealth: Payer: Self-pay | Admitting: *Deleted

## 2015-12-31 ENCOUNTER — Ambulatory Visit: Payer: Commercial Managed Care - PPO

## 2015-12-31 ENCOUNTER — Telehealth: Payer: Self-pay | Admitting: Hematology and Oncology

## 2015-12-31 DIAGNOSIS — Z510 Encounter for antineoplastic radiation therapy: Secondary | ICD-10-CM | POA: Diagnosis not present

## 2015-12-31 NOTE — Telephone Encounter (Signed)
left msg confirming f/u on 5/31

## 2015-12-31 NOTE — Telephone Encounter (Signed)
She was seen in urgent care and had bloodwork and normal xray but diagnosed with gout. Wanted to speak with Dr. Lindi Adie when she came in for radiation. I told her that Dt. Lindi Adie was out of the office until Tuesday but that we set up an appt with Selena Lesser on the 31st. She verbalized understanding.

## 2016-01-01 ENCOUNTER — Ambulatory Visit: Payer: Commercial Managed Care - PPO

## 2016-01-03 ENCOUNTER — Encounter (HOSPITAL_COMMUNITY): Payer: Self-pay

## 2016-01-03 ENCOUNTER — Emergency Department (HOSPITAL_COMMUNITY)
Admission: EM | Admit: 2016-01-03 | Discharge: 2016-01-03 | Disposition: A | Discharge status: Home / Self Care | Payer: Commercial Managed Care - PPO | Attending: Emergency Medicine | Admitting: Emergency Medicine

## 2016-01-03 ENCOUNTER — Emergency Department (HOSPITAL_COMMUNITY): Payer: Commercial Managed Care - PPO

## 2016-01-03 DIAGNOSIS — M19012 Primary osteoarthritis, left shoulder: Secondary | ICD-10-CM | POA: Insufficient documentation

## 2016-01-03 DIAGNOSIS — Z79899 Other long term (current) drug therapy: Secondary | ICD-10-CM | POA: Insufficient documentation

## 2016-01-03 DIAGNOSIS — M19011 Primary osteoarthritis, right shoulder: Secondary | ICD-10-CM | POA: Insufficient documentation

## 2016-01-03 DIAGNOSIS — R000 Tachycardia, unspecified: Secondary | ICD-10-CM | POA: Diagnosis not present

## 2016-01-03 DIAGNOSIS — Z8709 Personal history of other diseases of the respiratory system: Secondary | ICD-10-CM | POA: Diagnosis not present

## 2016-01-03 DIAGNOSIS — R0789 Other chest pain: Secondary | ICD-10-CM | POA: Diagnosis not present

## 2016-01-03 DIAGNOSIS — Z8719 Personal history of other diseases of the digestive system: Secondary | ICD-10-CM | POA: Diagnosis not present

## 2016-01-03 DIAGNOSIS — Z87891 Personal history of nicotine dependence: Secondary | ICD-10-CM | POA: Insufficient documentation

## 2016-01-03 DIAGNOSIS — Z86000 Personal history of in-situ neoplasm of breast: Secondary | ICD-10-CM | POA: Insufficient documentation

## 2016-01-03 DIAGNOSIS — R079 Chest pain, unspecified: Secondary | ICD-10-CM | POA: Diagnosis present

## 2016-01-03 DIAGNOSIS — Z3202 Encounter for pregnancy test, result negative: Secondary | ICD-10-CM | POA: Insufficient documentation

## 2016-01-03 LAB — CBC
HCT: 41.6 % (ref 36.0–46.0)
Hemoglobin: 13.4 g/dL (ref 12.0–15.0)
MCH: 29.3 pg (ref 26.0–34.0)
MCHC: 32.2 g/dL (ref 30.0–36.0)
MCV: 90.8 fL (ref 78.0–100.0)
Platelets: 171 10*3/uL (ref 150–400)
RBC: 4.58 MIL/uL (ref 3.87–5.11)
RDW: 13.8 % (ref 11.5–15.5)
WBC: 4.6 10*3/uL (ref 4.0–10.5)

## 2016-01-03 LAB — BASIC METABOLIC PANEL
Anion gap: 6 (ref 5–15)
BUN: 8 mg/dL (ref 6–20)
CO2: 25 mmol/L (ref 22–32)
Calcium: 8.7 mg/dL — ABNORMAL LOW (ref 8.9–10.3)
Chloride: 105 mmol/L (ref 101–111)
Creatinine, Ser: 0.82 mg/dL (ref 0.44–1.00)
GFR calc Af Amer: >60 mL/min (ref >60)
GFR calc non Af Amer: >60 mL/min (ref >60)
Glucose, Bld: 151 mg/dL — ABNORMAL HIGH (ref 65–99)
Potassium: 3.9 mmol/L (ref 3.5–5.1)
Sodium: 136 mmol/L (ref 135–145)

## 2016-01-03 LAB — I-STAT CHEM 8, ED
BUN: 7 mg/dL (ref 6–20)
Calcium, Ion: 1.01 mmol/L — ABNORMAL LOW (ref 1.12–1.23)
Chloride: 102 mmol/L (ref 101–111)
Creatinine, Ser: 0.8 mg/dL (ref 0.44–1.00)
Glucose, Bld: 148 mg/dL — ABNORMAL HIGH (ref 65–99)
HCT: 42 % (ref 36.0–46.0)
Hemoglobin: 14.3 g/dL (ref 12.0–15.0)
Potassium: 3.9 mmol/L (ref 3.5–5.1)
Sodium: 139 mmol/L (ref 135–145)
TCO2: 22 mmol/L (ref 0–100)

## 2016-01-03 LAB — I-STAT BETA HCG BLOOD, ED (MC, WL, AP ONLY): I-stat hCG, quantitative: <5 m[IU]/mL (ref <5)

## 2016-01-03 LAB — I-STAT TROPONIN, ED
Troponin i, poc: 0 ng/mL (ref 0.00–0.08)
Troponin i, poc: 0 ng/mL (ref 0.00–0.08)

## 2016-01-03 MED ORDER — MORPHINE SULFATE (PF) 4 MG/ML IV SOLN
4.0000 mg | Freq: Once | INTRAVENOUS | Status: AC
  Administered 2016-01-03: 4 mg via INTRAVENOUS
  Filled 2016-01-03: qty 1

## 2016-01-03 MED ORDER — OXYCODONE-ACETAMINOPHEN 5-325 MG PO TABS
1.0000 | ORAL_TABLET | Freq: Once | ORAL | Status: AC
  Administered 2016-01-03: 1 via ORAL
  Filled 2016-01-03: qty 1

## 2016-01-03 MED ORDER — IOPAMIDOL (ISOVUE-370) INJECTION 76%
INTRAVENOUS | Status: AC
  Administered 2016-01-03: 100 mL
  Filled 2016-01-03: qty 100

## 2016-01-03 NOTE — ED Notes (Signed)
EDP at bedside  

## 2016-01-03 NOTE — Discharge Instructions (Signed)
Please read and follow all provided instructions.  Your diagnoses today include:  1. Chest wall pain    I believe these symptoms may be caused from the radiation received for your breast cancer. There can be localized inflammation in the area that can cause discomfort.   Tests performed today include:  Vital signs. See below for your results today.   Medications prescribed:   Take as prescribed   Home care instructions:  Follow any educational materials contained in this packet.  Follow-up instructions: Please follow-up with your Oncologist for further evaluation of symptoms and treatment   Return instructions:   Please return to the Emergency Department if you do not get better, if you get worse, or new symptoms OR  - Fever (temperature greater than 101.3F)  - Bleeding that does not stop with holding pressure to the area    -Severe pain (please note that you may be more sore the day after your accident)  - Chest Pain  - Difficulty breathing  - Severe nausea or vomiting  - Inability to tolerate food and liquids  - Passing out  - Skin becoming red around your wounds  - Change in mental status (confusion or lethargy)  - New numbness or weakness     Please return if you have any other emergent concerns.  Additional Information:  Your vital signs today were: BP 106/72 mmHg   Pulse 82   Temp(Src) 100.2 F (37.9 C) (Oral)   Resp 12   SpO2 92%   LMP 07/06/2014 (Within Weeks) If your blood pressure (BP) was elevated above 135/85 this visit, please have this repeated by your doctor within one month. ---------------

## 2016-01-03 NOTE — ED Notes (Signed)
RN attempted to access port unsuccessfully. Pt. Requested peripheral IV instead.

## 2016-01-03 NOTE — ED Notes (Signed)
Pt. Transported to xray at this time.  

## 2016-01-03 NOTE — ED Provider Notes (Signed)
CSN: [id number]     Arrival date & time 01/03/16  4098 History   First MD Initiated Contact with Patient 01/03/16 [phone]     Chief Complaint  Patient presents with  . Chest Pain   (Consider location/radiation/quality/duration/timing/severity/associated sxs/prior Treatment) HPI 50 y.o. female with a hx of bilateral mastectomies for left-sided multifocal invasive ductal carcinoma, presents to the Emergency Department today complaining of chest pain with onset this morning. States pain was 4/10 initially when she woke up. Patient proceeded to go to work, but continued to have worsening pain. States pain is 10/10 and sharp in the center of her chest as well as constant. No radiation. Pt is undergoing radiation treatment for breast cancer of the left. Has has mastectomy of the right. Last radiation treatment was on the 18th of this month. Notes associated SOB, diaphoresis with nausea. No hx ACS/PE.     Past Medical History  Diagnosis Date  . BRCA1 positive     BRCA1 [id number]  . Arthritis     shoulders  . GERD (gastroesophageal reflux disease)   . History of breast cancer 2015  . Complication of anesthesia     respiratory depression/arrest with Dilaudid  . PONV (postoperative nausea and vomiting)   . Dental crowns present   . Cough with sputum 09/10/2015    clear sputum, per pt.  . Stuffy and runny nose 09/10/2015    green drainage from nose, per pt.  . Chest wall mass 09/2015   Past Surgical History  Procedure Laterality Date  . Cesarean section  1999; 2001; 2005  . Colonoscopy    . Melanoma excision N/A 04/16/2014    Procedure: WIDE LOCAL EXCISION OF BACK MELANOMA;  Surgeon: Rolm Bookbinder, MD;  Location: [place];  Service: General;  Laterality: N/A;  . Simple mastectomy with axillary sentinel node biopsy Bilateral 04/16/2014    Procedure: BILATERAL SKIN SPARING  MASTECTOMIES WITH LEFT AXILLARY SENTINEL NODE BIOPSY;  Surgeon: Rolm Bookbinder, MD;  Location: [facility];  Service: General;  Laterality: Bilateral;  . Breast reconstruction with placement of tissue expander and flex hd (acellular hydrated dermis) Bilateral 04/16/2014    Procedure: BILATERAL BREAST RECONSTRUCTION WITH PLACEMENT OF TISSUE EXPANDER AND FLEX HD (ACELLULAR HYDRATED DERMIS);  Surgeon: Irene Limbo, MD;  Location: [facility];  Service: Plastics;  Laterality: Bilateral;  . Debridement and closure wound Bilateral 05/15/2014    Procedure: DEBRIDEMENT AND CLOSURE OF BILATERAL MASECTOMY INCISIONS WITH BREAST EXPANSION;  Surgeon: Irene Limbo, MD;  Location: [facility];  Service: Plastics;  Laterality: Bilateral;  . Cholecystectomy  1995  . Laparoscopic assisted vaginal hysterectomy N/A 08/13/2014    Procedure: OPEN LAPAROSCOPY, EXPLORATORY LAPAROTOMY, TOTAL ABDOMINAL HYSTERECTOMY WITH BILATERAL SALPINGECTOMY AND BILATERAL OOPHORECTOMY;  Surgeon: Darlyn Chamber, MD;  Location: [facility];  Service: Gynecology;  Laterality: N/A;  . Removal of bilateral tissue expanders with placement of bilateral breast implants Bilateral 11/13/2014    Procedure: REMOVAL OF BILATERAL TISSUE EXPANDERS WITH PLACEMENT OF BILATERAL SILICONE IMPLANTS ;  Surgeon: Irene Limbo, MD;  Location: [facility];  Service: Plastics;  Laterality: Bilateral;  . Liposuction with lipofilling Bilateral 11/13/2014    Procedure: LIPOFILLING TO BILATERAL CHEST ;  Surgeon: Irene Limbo, MD;  Location: [facility];  Service: Plastics;  Laterality: Bilateral;  . Breast lumpectomy Left 09/16/2015    Procedure: LEFT BREAST MASS EXCISION;  Surgeon: Rolm Bookbinder, MD;  Location: [facility];  Service: General;  Laterality: Left;  . Abdominal hysterectomy    . Portacath placement Right 10/21/2015    Procedure: INSERTION PORT-A-CATH WITH ULTRASOUND ;  Surgeon: Rolm Bookbinder, MD;  Location: [facility];  Service:  General;  Laterality: Right;   Family History  Problem Relation Age of Onset  . Breast cancer Paternal Aunt 57    deceased 6s  . Breast cancer Paternal Grandmother     dx 93s; ? if had 2nd BC in 59s; deceased 78s  . Pancreatic cancer Paternal Uncle 56    smoker; deceased   Social History  Substance Use Topics  . Smoking status: Former Smoker -- 0.00 packs/day for 0 years    Quit date: 04/17/2014  . Smokeless tobacco: Never Used  . Alcohol Use: Yes     Comment: occasionally   OB History    No data available     Review of Systems ROS reviewed and all are negative for acute change except as noted in the HPI.  Allergies  Dilaudid and Codeine  Home Medications   Prior to Admission medications   Medication Sig Start Date End Date Taking? Authorizing Provider  Alum Hydroxide-Mag Carbonate (GAVISCON PO) Take 2 tablets by mouth daily as needed (for heartburn).     Historical Provider, MD  anastrozole (ARIMIDEX) 1 MG tablet TAKE 1 TABLET (1 MG TOTAL) BY MOUTH DAILY. Patient not taking: Reported on 11/30/2015 11/01/15   [name], MD  colchicine 0.6 MG tablet Take 1 tablet (0.6 mg total) by mouth 2 (two) times daily. 12/30/15   Billy Fischer, MD  fluocinonide cream (LIDEX) 0.05 % Reported on 12/28/2015 03/10/15   Historical Provider, MD  hyaluronate sodium (RADIAPLEXRX) GEL Apply 1 application topically once.    Historical Provider, MD  Ibuprofen-Diphenhydramine Cit (ADVIL PM PO) Take by mouth.    Historical Provider, MD  lidocaine-prilocaine (EMLA) cream Apply 1 application topically as needed. 10/26/15   [name], MD  lidocaine-prilocaine (EMLA) cream Apply to affected area once Patient not taking: Reported on 12/28/2015 11/01/15   [name], MD  non-metallic deodorant Jethro Poling) MISC Apply 1 application topically daily as needed.    Historical Provider, MD  ondansetron (ZOFRAN-ODT) 4 MG disintegrating tablet Take 1 tablet (4 mg total) by mouth every 6 (six) hours as needed. Reported  on 12/13/2015 12/13/15   Eppie Gibson, MD  oxyCODONE (OXY IR/ROXICODONE) 5 MG immediate release tablet Take 1-2 tablets (5-10 mg total) by mouth every 6 (six) hours as needed for moderate pain, severe pain or breakthrough pain. Patient not taking: Reported on 12/13/2015 10/21/15   Rolm Bookbinder, MD  permethrin Nancy Fetter) 5 % cream Reported on 12/28/2015 03/10/15   Historical Provider, MD  predniSONE (DELTASONE) 50 MG tablet 1 tab daily for 2 days then 1/2 tab daily for 2 days. Start today. 12/30/15   Billy Fischer, MD   SpO2 96%  LMP 07/06/2014 (Within Weeks)   Physical Exam  Constitutional: She is oriented to person, place, and time. She appears well-developed and well-nourished.  HENT:  Head: Normocephalic and atraumatic.  Eyes: EOM are normal. Pupils are equal, round, and reactive to light.  Neck: Normal range of motion. Neck supple. No tracheal deviation present.  Cardiovascular: Regular rhythm, normal heart sounds, intact distal pulses and normal pulses.  Tachycardia present.   No murmur heard. BUE Pulses appreciated   Pulmonary/Chest: Effort normal and breath sounds normal. No respiratory distress. She has no wheezes. She has no rales. She exhibits tenderness.  Redness noted around  anterior chest. No swelling. TTP  Abdominal: Soft. Bowel sounds are normal. There is no tenderness.  Musculoskeletal: Normal range of motion.  Neurological: She is alert and oriented to person, place, and time.  Skin: Skin is warm and dry.  Psychiatric: She has a normal mood and affect. Her behavior is normal. Thought content normal.  Nursing note and vitals reviewed.  ED Course  Procedures (including critical care time) Labs Review Labs Reviewed  BASIC METABOLIC PANEL - Abnormal; Notable for the following:    Glucose, Bld 151 (*)    Calcium 8.7 (*)    All other components within normal limits  I-STAT CHEM 8, ED - Abnormal; Notable for the following:    Glucose, Bld 148 (*)    Calcium, Ion 1.01 (*)     All other components within normal limits  CBC  I-STAT TROPOININ, ED  I-STAT BETA HCG BLOOD, ED ([facility], [facility], [facility] ONLY)  I-STAT TROPOININ, ED   Imaging Review Dg Chest 2 View  01/03/2016  CLINICAL DATA:  Chest pain EXAM: CHEST  2 VIEW COMPARISON:  10/21/2015 FINDINGS: Right-sided chest wall port is again noted. Cardiac shadow is stable. The lungs are clear bilaterally. No acute bony abnormality is seen. IMPRESSION: No acute abnormality noted. Electronically Signed   By: Inez Catalina M.D.   On: 01/03/2016 09:02   Ct Angio Chest Pe W/cm &/or Wo Cm  01/03/2016  CLINICAL DATA:  Shortness of breath and chest pain, history of breast carcinoma EXAM: CT ANGIOGRAPHY CHEST WITH CONTRAST TECHNIQUE: Multidetector CT imaging of the chest was performed using the standard protocol during bolus administration of intravenous contrast. Multiplanar CT image reconstructions and MIPs were obtained to evaluate the vascular anatomy. CONTRAST:  100 mL Isovue 370. COMPARISON:  None. FINDINGS: Lungs are well aerated bilaterally. No focal infiltrate or sizable effusion is seen. Bilateral breast implants are noted. The thoracic inlet is within normal limits. The thoracic aorta in its branches are unremarkable. Pulmonary artery is well visualized and shows no evidence of filling defect to suggest pulmonary embolism. No hilar or mediastinal adenopathy is noted. No axillary adenopathy is noted. The visualized upper abdomen reveals fatty infiltration of the liver. The known left adrenal adenoma is not as well appreciated on this exam. No acute bony abnormality is seen. Review of the MIP images confirms the above findings. IMPRESSION: Bilateral breast reconstructions. No evidence of pulmonary embolism. Fatty liver Electronically Signed   By: Inez Catalina M.D.   On: 01/03/2016 10:07   I have personally reviewed and evaluated these images and lab results as part of my medical decision-making.   EKG Interpretation   Date/Time:  Monday Jan 03 2016 08:45:16 EDT Ventricular Rate:  99 PR Interval:  142 QRS Duration: 83 QT Interval:  403 QTC Calculation: 517 R Axis:   -21 Text Interpretation:  Sinus rhythm Borderline left axis deviation Low  voltage, precordial leads Borderline T abnormalities, anterior leads  Prolonged QT interval Baseline wander in lead(s) I II aVR aVL V2 No  previous ECGs available Confirmed by NGUYEN, EMILY ([id number]) on 01/03/2016  8:50:30 AM Also confirmed by Alfonse Spruce, EMILY ([id number]), editor [place] CT,  Leda Gauze [phone])  on 01/03/2016 9:45:41 AM      MDM  I have reviewed and evaluated the relevant laboratory values I have reviewed and evaluated the relevant imaging studies.  I have interpreted the relevant EKG. I have reviewed the relevant previous healthcare records. I have reviewed EMS Documentation. I obtained HPI from historian. Patient discussed  with supervising physician  ED Course:  Assessment: Pt is a 50yF with hx bilateral mastectomies for left-sided multifocal invasive ductal carcinoma who presents with CP with onset this morning. On exam, pt in NAD. Nontoxic/nonseptic appearing. VS show tachycardia 120. BP 124/111. Oral Temp 100.72F. Lungs CTA. Heart RRR. Abdomen nontender soft. Anterior chest TTP with redness most likely from radiation therapy. Distal BUE pulses appreaciated . iStat Trop negative x2. EKG NSR with no acute abnormalities. CXR negative. Vitals improved with analgesia administration. Tachycardia and Hypertension most likely response to pain. Concern for PE given Hx and presentation. CT Angio ordered, which was negative for PE and Dissection. Given analgesia in ED with improvement of symptoms. Possible etiology from radiation treatments for breast carcinoma. Discussed with supervising physician who agrees with assessment and plan. Plan is to DC home with follow up to PCP/Oncologist.   Disposition/Plan:  DC Home Additional Verbal discharge instructions given and discussed with patient.  Pt  Instructed to f/u with PCP in the next week for evaluation and treatment of symptoms. Return precautions given Pt acknowledges and agrees with plan  Supervising Physician Harvel Quale, MD   Final diagnoses:  Chest wall pain     Shary Decamp, PA-C 01/03/16 1250  Harvel Quale, MD 01/08/16 [phone]

## 2016-01-03 NOTE — ED Notes (Signed)
Pt. Transported to CT at this time.  

## 2016-01-04 ENCOUNTER — Ambulatory Visit: Payer: Commercial Managed Care - PPO

## 2016-01-04 ENCOUNTER — Telehealth: Payer: Self-pay | Admitting: Oncology

## 2016-01-04 ENCOUNTER — Ambulatory Visit
Admission: RE | Admit: 2016-01-04 | Discharge: 2016-01-04 | Disposition: A | Discharge status: Home / Self Care | Payer: Commercial Managed Care - PPO | Source: Ambulatory Visit | Attending: Radiation Oncology | Admitting: Radiation Oncology

## 2016-01-04 DIAGNOSIS — Z510 Encounter for antineoplastic radiation therapy: Secondary | ICD-10-CM | POA: Diagnosis not present

## 2016-01-04 NOTE — Telephone Encounter (Signed)
Received a message from the Radiation Therapists saying that Krystal [name] canceled her radiation today due to muscle soreness and fever.  Called [name] who said that she was having chest and back pain and could not breath yesterday.  She went to the ER and had a CT Scan which was negative for PE.  She was told the pain may be due to radiation.  She also said she recently starting taking prednisone and colchicine for gout and starting having muscle aches after taking.  She has now stopped taking them.  She also reports feeling hot and cold and thinks she had a fever this morning.  She did say the pain in her chest and back is better today.  She denies having a cough or any respiratory symptoms.

## 2016-01-05 ENCOUNTER — Encounter: Payer: Self-pay | Admitting: Radiation Oncology

## 2016-01-05 ENCOUNTER — Ambulatory Visit
Admission: RE | Admit: 2016-01-05 | Discharge: 2016-01-05 | Disposition: A | Discharge status: Home / Self Care | Payer: Commercial Managed Care - PPO | Source: Ambulatory Visit | Attending: Radiation Oncology | Admitting: Radiation Oncology

## 2016-01-05 ENCOUNTER — Telehealth: Payer: Self-pay | Admitting: *Deleted

## 2016-01-05 ENCOUNTER — Encounter: Payer: Commercial Managed Care - PPO | Admitting: Nurse Practitioner

## 2016-01-05 VITALS — BP 112/74 | HR 91 | Temp 98.4°F | Ht 64.0 in | Wt 256.7 lb

## 2016-01-05 DIAGNOSIS — C50112 Malignant neoplasm of central portion of left female breast: Secondary | ICD-10-CM

## 2016-01-05 NOTE — Progress Notes (Signed)
Krystal [name] has completed 27 fractions to her left chest wall.  She reports feeling soreness in her left chest "like after coughing."  She went to the ER on Monday with left sided chest pain, fever and trouble breathing.  She feels better today although she reports having night sweats last night and feeling like she had a fever.  She also reports not having an appetite.  She has lost 5 lbs since last week.  She is using radiaplex.  The skin on her left breast is red with peeling areas on her left chest, left underarm and underneath her breast.  BP 112/74 mmHg  Pulse 91  Temp(Src) 98.4 F (36.9 C) (Oral)  Ht 5\' 4"  (1.626 m)  Wt 256 lb 11.2 oz (116.438 kg)  BMI 44.04 kg/m2  SpO2 100%  LMP 07/06/2014 (Within Weeks)   Wt Readings from Last 3 Encounters:  01/05/16 256 lb 11.2 oz (116.438 kg)  12/28/15 261 lb (118.389 kg)  12/23/15 262 lb 8 oz (119.069 kg)

## 2016-01-05 NOTE — Progress Notes (Signed)
Gentian violet applied under patient's left breast.

## 2016-01-05 NOTE — Progress Notes (Signed)
  Radiation Oncology         [phone] ________________________________  Name: Krystal [name] MRN: [id number]  Date: 01/05/2016  DOB: 1965-09-13  Weekly Radiation Therapy Management    ICD-9-CM ICD-10-CM   1. 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 . . . . . . . The patient presents for routine under treatment assessment.                                Krystal [name] has completed 27 fractions to her left chest wall. She reports feeling soreness in her left chest at [place], "like after coughing." She went to the ER on Monday with left sided chest pain, fever, and difficulty breathing. She feels better today, although she reports night sweats last night and feeling like she had a fever. She also reports not having an appetite. She has lost 5 lbs since last week. She is using radiaplex.                                 Set-up films were reviewed.                                 The chart was checked. Physical Findings. . .  height is '5\' 4"'$  (1.626 m) and weight is 256 lb 11.2 oz (116.438 kg). Her oral temperature is 98.4 F (36.9 C). Her blood pressure is 112/74 and her pulse is 91. Her oxygen saturation is 100%. . The lungs are clear. The heart has a regular rhythm and rate. The left chest region shows diffuse erythema. Impending moist desquamation in the inframammary fold. Impression . . . . . . . The patient is tolerating radiation. Plan . . . . . . . . . . . . Continue treatment as planned. We will apply Gentian Violet to the inframammary fold area.  ________________________________   Blair Promise, PhD, MD  This document serves as a record of services personally performed by [name], MD. It was created on his behalf by Darcus Austin, a  trained medical scribe. The creation of this record is based on the scribe's personal observations and the provider's statements to them. This document has been checked and approved by the attending provider.

## 2016-01-05 NOTE — Telephone Encounter (Signed)
Spoke to patient- per notes pt was to follow up with PCP. Pt states she did go to urgent care and was given prednisone and colchicine. Pt reports her foot is much better. She did not feel an additional visit was necessary since the concern was addressed at urgent care. Pt reports she had an episode of heart palpitations that sent her to the ED after starting the prednisone. She states they believed the medication was the cause. All testing was negative for PE or Cardiac concerns.

## 2016-01-06 ENCOUNTER — Ambulatory Visit
Admission: RE | Admit: 2016-01-06 | Discharge: 2016-01-06 | Disposition: A | Discharge status: Home / Self Care | Payer: Commercial Managed Care - PPO | Source: Ambulatory Visit | Attending: Radiation Oncology | Admitting: Radiation Oncology

## 2016-01-06 DIAGNOSIS — Z170 Estrogen receptor positive status [ER+]: Secondary | ICD-10-CM | POA: Insufficient documentation

## 2016-01-06 DIAGNOSIS — C50112 Malignant neoplasm of central portion of left female breast: Secondary | ICD-10-CM | POA: Insufficient documentation

## 2016-01-06 DIAGNOSIS — Z510 Encounter for antineoplastic radiation therapy: Secondary | ICD-10-CM | POA: Insufficient documentation

## 2016-01-07 ENCOUNTER — Ambulatory Visit
Admission: RE | Admit: 2016-01-07 | Discharge: 2016-01-07 | Disposition: A | Discharge status: Home / Self Care | Payer: Commercial Managed Care - PPO | Source: Ambulatory Visit | Attending: Radiation Oncology | Admitting: Radiation Oncology

## 2016-01-07 NOTE — Progress Notes (Signed)
Patient reported that the gentian violet really helped with her pain and helped her sleep.  Reapplied gentian violet to the small desquamated area under patient's left breast.

## 2016-01-10 ENCOUNTER — Ambulatory Visit
Admission: RE | Admit: 2016-01-10 | Discharge: 2016-01-10 | Disposition: A | Discharge status: Home / Self Care | Payer: Commercial Managed Care - PPO | Source: Ambulatory Visit | Attending: Radiation Oncology | Admitting: Radiation Oncology

## 2016-01-10 DIAGNOSIS — Z170 Estrogen receptor positive status [ER+]: Secondary | ICD-10-CM | POA: Diagnosis not present

## 2016-01-10 DIAGNOSIS — Z510 Encounter for antineoplastic radiation therapy: Secondary | ICD-10-CM | POA: Diagnosis present

## 2016-01-10 DIAGNOSIS — C50112 Malignant neoplasm of central portion of left female breast: Secondary | ICD-10-CM | POA: Diagnosis present

## 2016-01-11 ENCOUNTER — Ambulatory Visit
Admission: RE | Admit: 2016-01-11 | Discharge: 2016-01-11 | Disposition: A | Discharge status: Home / Self Care | Payer: Commercial Managed Care - PPO | Source: Ambulatory Visit | Attending: Radiation Oncology | Admitting: Radiation Oncology

## 2016-01-11 ENCOUNTER — Encounter: Payer: Self-pay | Admitting: Radiation Oncology

## 2016-01-11 ENCOUNTER — Ambulatory Visit: Payer: Commercial Managed Care - PPO

## 2016-01-11 VITALS — BP 115/74 | HR 79 | Temp 98.2°F | Ht 64.0 in | Wt 262.9 lb

## 2016-01-11 DIAGNOSIS — C50112 Malignant neoplasm of central portion of left female breast: Secondary | ICD-10-CM

## 2016-01-11 DIAGNOSIS — Z510 Encounter for antineoplastic radiation therapy: Secondary | ICD-10-CM | POA: Diagnosis not present

## 2016-01-11 NOTE — Progress Notes (Signed)
  Radiation Oncology         [phone] ________________________________  Name: Krystal [name] MRN: [id number]  Date: 01/11/2016  DOB: January 19, 1966  Weekly Radiation Therapy Management    ICD-9-CM ICD-10-CM   1. 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 . . . . . . . The patient presents for routine under treatment assessment.                                Krystal [name] has completed 31 fractions to her left chest wall. She denies having pain. She does have fatigue. She is using radiaplex gel. The skin on her left breast is red with dry peeling areas under her left arm and breast. She reports the Gentian Violet really helped with both discomfort and keeping the treatment area dry.                                 Set-up films were reviewed.                                 The chart was checked. Physical Findings. . .  height is '5\' 4"'$  (1.626 m) and weight is 262 lb 14.4 oz (119.251 kg). Her oral temperature is 98.2 F (36.8 C). Her blood pressure is 115/74 and her pulse is 79. . The lungs are clear. The heart has a regular rhythm and rate. Inframammary fold areas dried up nicely with Gentian Violet. She has erythema in the lateral aspect of the breast. No skin breakdown in that area. Impression . . . . . . . The patient is tolerating radiation. Plan . . . . . . . . . . . . Continue treatment as planned.  ________________________________   Blair Promise, PhD, MD    This document serves as a record of services personally performed by [name], MD. It was created on his behalf by Lendon Collar, a trained medical scribe. The creation of this record is based on the scribe's personal observations and the provider's statements to them. This document  has been checked and approved by the attending provider.

## 2016-01-11 NOTE — Progress Notes (Signed)
Krystal [name] has completed 31 fractions to her left chest wall.  She denies having pain.  She does have fatigue.  She is using radiaplex gel.  The skin on her left breast is red with dry peeling areas under her left arm and breast.  She reports the Gentian Violet really helped.  BP 115/74 mmHg  Pulse 79  Temp(Src) 98.2 F (36.8 C) (Oral)  Ht 5\' 4"  (1.626 m)  Wt 262 lb 14.4 oz (119.251 kg)  BMI 45.10 kg/m2  LMP 07/06/2014 (Within Weeks)   Wt Readings from Last 3 Encounters:  01/11/16 262 lb 14.4 oz (119.251 kg)  01/05/16 256 lb 11.2 oz (116.438 kg)  12/28/15 261 lb (118.389 kg)

## 2016-01-12 ENCOUNTER — Ambulatory Visit
Admission: RE | Admit: 2016-01-12 | Discharge: 2016-01-12 | Disposition: A | Discharge status: Home / Self Care | Payer: Commercial Managed Care - PPO | Source: Ambulatory Visit | Attending: Radiation Oncology | Admitting: Radiation Oncology

## 2016-01-12 ENCOUNTER — Ambulatory Visit: Payer: Commercial Managed Care - PPO

## 2016-01-12 ENCOUNTER — Ambulatory Visit (HOSPITAL_BASED_OUTPATIENT_CLINIC_OR_DEPARTMENT_OTHER): Payer: Commercial Managed Care - PPO

## 2016-01-12 DIAGNOSIS — C50112 Malignant neoplasm of central portion of left female breast: Secondary | ICD-10-CM | POA: Diagnosis not present

## 2016-01-12 DIAGNOSIS — Z5112 Encounter for antineoplastic immunotherapy: Secondary | ICD-10-CM

## 2016-01-12 DIAGNOSIS — Z510 Encounter for antineoplastic radiation therapy: Secondary | ICD-10-CM | POA: Diagnosis not present

## 2016-01-12 MED ORDER — HEPARIN SOD (PORK) LOCK FLUSH 100 UNIT/ML IV SOLN
500.0000 [IU] | Freq: Once | INTRAVENOUS | Status: AC | PRN
  Administered 2016-01-12: 500 [IU]
  Filled 2016-01-12: qty 5

## 2016-01-12 MED ORDER — DIPHENHYDRAMINE HCL 25 MG PO CAPS
ORAL_CAPSULE | ORAL | Status: AC
  Filled 2016-01-12: qty 2

## 2016-01-12 MED ORDER — SODIUM CHLORIDE 0.9 % IV SOLN
Freq: Once | INTRAVENOUS | Status: AC
  Administered 2016-01-12: 15:00:00 via INTRAVENOUS

## 2016-01-12 MED ORDER — SODIUM CHLORIDE 0.9 % IV SOLN
6.0000 mg/kg | Freq: Once | INTRAVENOUS | Status: AC
  Administered 2016-01-12: 693 mg via INTRAVENOUS
  Filled 2016-01-12: qty 33

## 2016-01-12 MED ORDER — DIPHENHYDRAMINE HCL 25 MG PO CAPS
50.0000 mg | ORAL_CAPSULE | Freq: Once | ORAL | Status: AC
  Administered 2016-01-12: 50 mg via ORAL

## 2016-01-12 MED ORDER — SODIUM CHLORIDE 0.9% FLUSH
10.0000 mL | INTRAVENOUS | Status: DC | PRN
  Administered 2016-01-12: 10 mL
  Filled 2016-01-12: qty 10

## 2016-01-12 MED ORDER — ACETAMINOPHEN 325 MG PO TABS
650.0000 mg | ORAL_TABLET | Freq: Once | ORAL | Status: AC
  Administered 2016-01-12: 650 mg via ORAL

## 2016-01-12 MED ORDER — ACETAMINOPHEN 325 MG PO TABS
ORAL_TABLET | ORAL | Status: AC
  Filled 2016-01-12: qty 2

## 2016-01-12 NOTE — Patient Instructions (Signed)
[facility] Discharge Instructions for Patients Receiving Chemotherapy  Today you received the following chemotherapy agents Herceptin  To help prevent nausea and vomiting after your treatment, we encourage you to take your nausea medication    If you develop nausea and vomiting that is not controlled by your nausea medication, call the clinic.   BELOW ARE SYMPTOMS THAT SHOULD BE REPORTED IMMEDIATELY:  *FEVER GREATER THAN 100.5 F  *CHILLS WITH OR WITHOUT FEVER  NAUSEA AND VOMITING THAT IS NOT CONTROLLED WITH YOUR NAUSEA MEDICATION  *UNUSUAL SHORTNESS OF BREATH  *UNUSUAL BRUISING OR BLEEDING  TENDERNESS IN MOUTH AND THROAT WITH OR WITHOUT PRESENCE OF ULCERS  *URINARY PROBLEMS  *BOWEL PROBLEMS  UNUSUAL RASH Items with * indicate a potential emergency and should be followed up as soon as possible.  Feel free to call the clinic you have any questions or concerns. The clinic phone number is [phone].  Please show the CHEMO ALERT CARD at check-in to the Emergency Department and triage nurse.   

## 2016-01-13 ENCOUNTER — Ambulatory Visit: Payer: Commercial Managed Care - PPO

## 2016-01-13 ENCOUNTER — Ambulatory Visit
Admission: RE | Admit: 2016-01-13 | Discharge: 2016-01-13 | Disposition: A | Discharge status: Home / Self Care | Payer: Commercial Managed Care - PPO | Source: Ambulatory Visit | Attending: Radiation Oncology | Admitting: Radiation Oncology

## 2016-01-13 DIAGNOSIS — Z510 Encounter for antineoplastic radiation therapy: Secondary | ICD-10-CM | POA: Diagnosis not present

## 2016-01-14 ENCOUNTER — Ambulatory Visit
Admission: RE | Admit: 2016-01-14 | Discharge: 2016-01-14 | Disposition: A | Discharge status: Home / Self Care | Payer: Commercial Managed Care - PPO | Source: Ambulatory Visit | Attending: Radiation Oncology | Admitting: Radiation Oncology

## 2016-01-14 ENCOUNTER — Encounter: Payer: Self-pay | Admitting: Radiation Oncology

## 2016-01-14 ENCOUNTER — Ambulatory Visit: Payer: Commercial Managed Care - PPO

## 2016-01-14 DIAGNOSIS — Z510 Encounter for antineoplastic radiation therapy: Secondary | ICD-10-CM | POA: Diagnosis not present

## 2016-01-20 NOTE — Progress Notes (Incomplete)
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 During treatment she had left breast/arm erythema and dry desquamation. She used Gentian Violet to manage these side effects. She also had occasional soreness and pain in the treatment areas.   Plan: The patient has completed radiation treatment. The patient will return to radiation oncology clinic for routine followup in one month. I advised them to call or return sooner if they have any questions or concerns related to their recovery or treatment.  -----------------------------------  Blair Promise, PhD, MD  This document serves as a record of services personally performed by [name], MD. It was created on his behalf by Derek Mound, a trained medical scribe. The creation of this record is based on the scribe's personal observations and the provider's statements to them. This document has been checked and approved by the attending provider.

## 2016-02-03 ENCOUNTER — Encounter: Payer: Self-pay | Admitting: Hematology and Oncology

## 2016-02-03 ENCOUNTER — Other Ambulatory Visit (HOSPITAL_BASED_OUTPATIENT_CLINIC_OR_DEPARTMENT_OTHER): Payer: Commercial Managed Care - PPO

## 2016-02-03 ENCOUNTER — Ambulatory Visit (HOSPITAL_BASED_OUTPATIENT_CLINIC_OR_DEPARTMENT_OTHER)
Admission: RE | Admit: 2016-02-03 | Discharge: 2016-02-03 | Disposition: A | Discharge status: Home / Self Care | Payer: Commercial Managed Care - PPO | Source: Ambulatory Visit | Attending: Internal Medicine | Admitting: Internal Medicine

## 2016-02-03 ENCOUNTER — Telehealth: Payer: Self-pay | Admitting: Hematology and Oncology

## 2016-02-03 ENCOUNTER — Ambulatory Visit: Payer: Commercial Managed Care - PPO

## 2016-02-03 ENCOUNTER — Telehealth (HOSPITAL_COMMUNITY): Payer: Self-pay | Admitting: Vascular Surgery

## 2016-02-03 ENCOUNTER — Ambulatory Visit (HOSPITAL_BASED_OUTPATIENT_CLINIC_OR_DEPARTMENT_OTHER): Payer: Commercial Managed Care - PPO | Admitting: Hematology and Oncology

## 2016-02-03 ENCOUNTER — Encounter (HOSPITAL_COMMUNITY): Payer: Self-pay | Admitting: Internal Medicine

## 2016-02-03 ENCOUNTER — Ambulatory Visit (HOSPITAL_COMMUNITY)
Admission: RE | Admit: 2016-02-03 | Discharge: 2016-02-03 | Disposition: A | Discharge status: Home / Self Care | Payer: Commercial Managed Care - PPO | Source: Ambulatory Visit | Attending: Internal Medicine | Admitting: Internal Medicine

## 2016-02-03 VITALS — BP 118/76 | HR 76 | Wt 257.2 lb

## 2016-02-03 VITALS — BP 140/76 | HR 90 | Temp 98.4°F | Resp 18 | Ht 64.0 in | Wt 256.9 lb

## 2016-02-03 DIAGNOSIS — T451X5A Adverse effect of antineoplastic and immunosuppressive drugs, initial encounter: Secondary | ICD-10-CM | POA: Diagnosis not present

## 2016-02-03 DIAGNOSIS — C50112 Malignant neoplasm of central portion of left female breast: Secondary | ICD-10-CM

## 2016-02-03 DIAGNOSIS — I427 Cardiomyopathy due to drug and external agent: Secondary | ICD-10-CM | POA: Insufficient documentation

## 2016-02-03 DIAGNOSIS — Z803 Family history of malignant neoplasm of breast: Secondary | ICD-10-CM | POA: Diagnosis not present

## 2016-02-03 DIAGNOSIS — Z87891 Personal history of nicotine dependence: Secondary | ICD-10-CM | POA: Diagnosis not present

## 2016-02-03 DIAGNOSIS — Z170 Estrogen receptor positive status [ER+]: Secondary | ICD-10-CM

## 2016-02-03 DIAGNOSIS — I351 Nonrheumatic aortic (valve) insufficiency: Secondary | ICD-10-CM | POA: Diagnosis not present

## 2016-02-03 LAB — CBC WITH DIFFERENTIAL/PLATELET
BASO%: 0.4 % (ref 0.0–2.0)
Basophils Absolute: 0 10*3/uL (ref 0.0–0.1)
EOS%: 6.7 % (ref 0.0–7.0)
Eosinophils Absolute: 0.5 10*3/uL (ref 0.0–0.5)
HCT: 42.8 % (ref 34.8–46.6)
HGB: 14.3 g/dL (ref 11.6–15.9)
LYMPH%: 20.9 % (ref 14.0–49.7)
MCH: 30.2 pg (ref 25.1–34.0)
MCHC: 33.4 g/dL (ref 31.5–36.0)
MCV: 90.5 fL (ref 79.5–101.0)
MONO#: 0.4 10*3/uL (ref 0.1–0.9)
MONO%: 5.8 % (ref 0.0–14.0)
NEUT#: 4.7 10*3/uL (ref 1.5–6.5)
NEUT%: 66.2 % (ref 38.4–76.8)
Platelets: 245 10*3/uL (ref 145–400)
RBC: 4.73 10*6/uL (ref 3.70–5.45)
RDW: 13.5 % (ref 11.2–14.5)
WBC: 7.1 10*3/uL (ref 3.9–10.3)
lymph#: 1.5 10*3/uL (ref 0.9–3.3)

## 2016-02-03 LAB — COMPREHENSIVE METABOLIC PANEL
ALT: 43 U/L (ref 0–55)
AST: 27 U/L (ref 5–34)
Albumin: 4 g/dL (ref 3.5–5.0)
Alkaline Phosphatase: 90 U/L (ref 40–150)
Anion Gap: 11 mEq/L (ref 3–11)
BUN: 7.4 mg/dL (ref 7.0–26.0)
CO2: 28 mEq/L (ref 22–29)
Calcium: 9.7 mg/dL (ref 8.4–10.4)
Chloride: 104 mEq/L (ref 98–109)
Creatinine: 0.8 mg/dL (ref 0.6–1.1)
EGFR: 86 mL/min/{1.73_m2} — ABNORMAL LOW (ref >90)
Glucose: 98 mg/dl (ref 70–140)
Potassium: 4.7 mEq/L (ref 3.5–5.1)
Sodium: 143 mEq/L (ref 136–145)
Total Bilirubin: 0.41 mg/dL (ref 0.20–1.20)
Total Protein: 8.2 g/dL (ref 6.4–8.3)

## 2016-02-03 LAB — ECHOCARDIOGRAM COMPLETE
E decel time: 176 msec
E/e' ratio: 9.77
FS: 26 % — AB (ref 28–44)
IVS/LV PW RATIO, ED: 1.08
LA ID, A-P, ES: 36 mm
LA diam end sys: 36 mm
LA diam index: 1.64 cm/m2
LA vol A4C: 28.2 ml
LA vol index: 15.4 mL/m2
LA vol: 33.9 mL
LV E/e' medial: 9.77
LV E/e'average: 9.77
LV PW d: 11 mm — AB (ref 0.6–1.1)
LV e' LATERAL: 6.85 cm/s
LVOT area: 3.14 cm2
LVOT diameter: 20 mm
MV Dec: 176
MV pk A vel: 63.5 m/s
MV pk E vel: 66.9 m/s
TAPSE: 21.2 mm
TDI e' lateral: 6.85
TDI e' medial: 4.79

## 2016-02-03 MED ORDER — CARVEDILOL 3.125 MG PO TABS: 3.1250 mg | ORAL_TABLET | Freq: Two times a day (BID) | ORAL | Status: DC

## 2016-02-03 NOTE — Progress Notes (Signed)
Patient ID: Krystal [name], female   DOB: 1965-10-29, 50 y.o.   MRN: [id number] Patient ID: Krystal [name], female   DOB: [birth year], 50 y.o.   MRN: [id number]   [facility] CONSULT NOTE  Referring Physician: Lindi Adie Primary Care: Primary Cardiologist:  HPI: INTERVAL HISTORY: Krystal [name] is a 50 year old with above-mentioned history of bilateral mastectomies for left-sided multifocal invasive ductal carcinoma that was noticed on Oncotype and we recommended hormonal therapy but she was noncompliant and did not take any antiestrogen therapy. She went a full year without any treatment and felt a lump in the breast while she was undergoing breast reconstruction and fat grafting. Lap C was then performed which came back as recurrent invasive ductal carcinoma. She underwent resection on 09/16/2015 by Dr. Donne Hazel and final pathology revealed that this was an ER/PR and HER-2 positive breast cancer. Because of this she was referred back was performed discussion. She tells me that she resume taking anastrozole about a month ago after he was diagnosed with a recurrence. She does complain of arthralgias but otherwise tolerating it fairly well.  Overall feeling ok. Completed radiation. Denies SOB/orthpnea. Complains of fatigue. 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     Cancer of central portion of left female breast ([place])   04/16/2014 Surgery Bilateral mastectomies: Left breast : Multifocal invasive ductal carcinoma positive for lymphovascular invasion, 1.2 cm and 0.6 cm, grade 3, high-grade DCIS with comedonecrosis, 4 SLN negative T1 C. N0 M0 stage IA: Oncotype DX 13 (ROR 9%)   04/16/2014 Surgery Left upper back melanoma resected no residual melanoma identified on re-resection margins negative   05/26/2014 - 08/03/2015 Anti-estrogen oral therapy Tamoxifen 20 mg daily with a plan to switch her to aromatase inhibitors once she gets oophorectomy ( patient did not continue antiestrogen therapy by  choice)   08/13/2014 Surgery oophorectomy with total abdominal hysterectomy   09/08/2015 -  Anti-estrogen oral therapy Resumed anastrozole after she found recurrence of breast cancer   09/16/2015 Surgery Skin, left: IDC grade 3; 0.5 cm, margins negative    11/08/2015 -  Chemotherapy Herceptin every 3 weeks plus anastrozole                   10/2015 Echo 60-65% Lat s' 10.8 GLS -20 02/03/2016 ECHO Ef  45-50%. Lat S' 9.8 GLS -17.1     Past Medical History  Diagnosis Date  . BRCA1 positive     BRCA1 [id number]  . Arthritis     shoulders  . GERD (gastroesophageal reflux disease)   . History of breast cancer 2015  . Complication of anesthesia     respiratory depression/arrest with Dilaudid  . PONV (postoperative nausea and vomiting)   . Dental crowns present   . Cough with sputum 09/10/2015    clear sputum, per pt.  . Stuffy and runny nose 09/10/2015    green drainage from nose, per pt.  . Chest wall mass 09/2015    Current Outpatient Prescriptions  Medication Sig Dispense Refill  . Ibuprofen-Diphenhydramine Cit (ADVIL PM PO) Take by mouth. Reported on 01/05/2016    . lidocaine-prilocaine (EMLA) cream Apply 1 application topically as needed. 30 g 1  . non-metallic deodorant (ALRA) MISC Apply 1 application topically daily as needed.    . permethrin (ELIMITE) 5 % cream Reported on 01/05/2016    . acetaminophen (TYLENOL) 500 MG tablet Take 500 mg by mouth every 6 (six) hours as needed. Reported on 02/03/2016    . Alum Hydroxide-Mag Carbonate (GAVISCON PO) Take  2 tablets by mouth daily as needed (for heartburn). Reported on 02-09-16    . ondansetron (ZOFRAN-ODT) 4 MG disintegrating tablet Take 1 tablet (4 mg total) by mouth every 6 (six) hours as needed. Reported on 12/13/2015 (Patient not taking: Reported on 02/09/16) 20 tablet 5   No current facility-administered medications for this encounter.    Allergies  Allergen Reactions  . Dilaudid [Hydromorphone] Other (See  Comments)    RESPIRATORY DEPRESSION  . Codeine Nausea And Vomiting      Social History   Social History  . Marital Status: Widowed    Spouse Name: N/A  . Number of Children: 3  . Years of Education: N/A   Occupational History  . Oncologist   Social History Main Topics  . Smoking status: Former Smoker -- 0.00 packs/day for 0 years    Quit date: 04/17/2014  . Smokeless tobacco: Never Used  . Alcohol Use: Yes     Comment: occasionally  . Drug Use: No  . Sexual Activity: Not on file   Other Topics Concern  . Not on file   Social History Narrative      Family History  Problem Relation Age of Onset  . Breast cancer Paternal Aunt 12    deceased 76s  . Breast cancer Paternal Grandmother     dx 70s; ? if had 2nd BC in 69s; deceased 29s  . Pancreatic cancer Paternal Uncle 77    smoker; deceased    Filed Vitals:   02-09-2016 1144  BP: 118/76  Pulse: 76  Weight: 257 lb 4 oz (116.688 kg)  SpO2: 98%    PHYSICAL EXAM: General:  Well appearing. No respiratory difficulty HEENT: normal Neck: supple. no JVD. Carotids 2+ bilat; no bruits. No lymphadenopathy or thryomegaly appreciated. Cor: PMI nondisplaced. Regular rate & rhythm. No rubs, gallops or murmurs. Lungs: clear Abdomen: obese soft, nontender, nondistended. No hepatosplenomegaly. No bruits or masses. Good bowel sounds. Extremities: no cyanosis, clubbing, rash, edema Neuro: alert & oriented x 3, cranial nerves grossly intact. moves all 4 extremities w/o difficulty. Affect pleasant.    ASSESSMENT & PLAN: 1. Breast CA, recurrent HER-2-neu+    --ECHO parameters discussed and reviewed during OV. EF and Lat 2' down .   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   --Start carvedilol 3.125 mg twice a day   2. Former Smoker- quit April 2017  3. Herceptin-induced DM   Follow up in 3 weeks with pharmacy  Follow up in 6 weeks with an ECHO. Darrick Grinder,  NP-C  12:10 PM   Patient seen and examined with Darrick Grinder, NP. We discussed all aspects of the encounter. I agree with the assessment and plan as stated above.   She has very mild Herceptin cardiotoxicity. Will hold Herceptin and start carvedilol. See back in 2-3 weeks and start losartan. Repeat echo 6 weeks. D/w Dr. Lindi Adie. Discussed role of scheduled treatment interruptions with her.   Tarita Deshmukh,MD 4:00 PM

## 2016-02-03 NOTE — Patient Instructions (Signed)
START Carvedilol (Coreg) 3.125 mg tablet twice daily.  Return in 3 weeks for appointment with CHF clinical pharmacist [place].  Follow up in 6 weeks with Dr. Haroldine Laws and echocardiogram.  Do the following things EVERYDAY: 1) Weigh yourself in the morning before breakfast. Write it down and keep it in a log. 2) Take your medicines as prescribed 3) Eat low salt foods-Limit salt (sodium) to 2000 mg per day.  4) Stay as active as you can everyday 5) Limit all fluids for the day to less than 2 liters

## 2016-02-03 NOTE — Telephone Encounter (Signed)
appt made and avs printed. Hold on Herceptin untill cardiology approves resumption of treatment

## 2016-02-03 NOTE — Telephone Encounter (Signed)
Left pt message to make f/u appt w/ echo 6 week and pharmacy appt in 3 weeks

## 2016-02-03 NOTE — Progress Notes (Signed)
Patient Care Team: [name] as PCP - General (General Practice)  DIAGNOSIS: Cancer of central portion of left female breast ([place])   Staging form: Breast, AJCC 7th Edition     Clinical: No stage assigned - Unsigned     Pathologic: Stage IA (T1c, N0, cM0) - Signed by Rulon Eisenmenger, MD on 05/12/2014   SUMMARY OF ONCOLOGIC HISTORY:   Cancer of central portion of left female breast ([place])   04/16/2014 Surgery Bilateral mastectomies: Left breast : Multifocal invasive ductal carcinoma positive for lymphovascular invasion, 1.2 cm and 0.6 cm, grade 3, high-grade DCIS with comedonecrosis, 4 SLN negative T1 C. N0 M0 stage IA: Oncotype DX 13 (ROR 9%)   04/16/2014 Surgery Left upper back melanoma resected no residual melanoma identified on re-resection margins negative   05/26/2014 - 06/08/2015 Anti-estrogen oral therapy Tamoxifen 20 mg daily with a plan to switch her to aromatase inhibitors once she gets oophorectomy ( patient did not continue antiestrogen therapy by choice)   08/13/2014 Surgery oophorectomy with total abdominal hysterectomy   09/08/2015 -  Anti-estrogen oral therapy Resumed anastrozole after she found recurrence of breast cancer   09/16/2015 Surgery Skin, left: IDC grade 3; 0.5 cm, margins negative, ER 95%, PR 10%, HER-2 positive ratio 2.08   11/08/2015 -  Chemotherapy Herceptin every 3 weeks plus anastrozole   11/24/2015 - 01/14/2016 Radiation Therapy Adjuvant radiation therapy by Dr. Sondra Come    CHIEF COMPLIANT: Follow-up after radiation therapy  INTERVAL HISTORY: Krystal [name] is a 50 year old with above-mentioned history of left breast cancer underwent bilateral mastectomies and was on Herceptin maintenance along with adjuvant radiation. Radiation was completed on 01/14/2016. She still continues to have fatigue related to the radiation treatment. She has seen by cardiologist who determined that she had a declining ejection fraction which is attributable to Herceptin. The recommended  holding Herceptin for at least 6 weeks and then repeat another echocardiogram. Patient does not have any signs and symptoms of heart failure. She was Started on carvedilol. She is yet to resume anastrozole.  REVIEW OF SYSTEMS:   Constitutional: Denies fevers, chills or abnormal weight loss Eyes: Denies blurriness of vision Ears, nose, mouth, throat, and face: Denies mucositis or sore throat Respiratory: Denies cough, dyspnea or wheezes Cardiovascular: Denies palpitation, chest discomfort Gastrointestinal:  Denies nausea, heartburn or change in bowel habits Skin: Denies abnormal skin rashes Lymphatics: Denies new lymphadenopathy or easy bruising Neurological:Denies numbness, tingling or new weaknesses Behavioral/Psych: Mood is stable, no new changes  Extremities: No lower extremity edema  All other systems were reviewed with the patient and are negative.  I have reviewed the past medical history, past surgical history, social history and family history with the patient and they are unchanged from previous note.  ALLERGIES:  is allergic to dilaudid and codeine.  MEDICATIONS:  Current Outpatient Prescriptions  Medication Sig Dispense Refill  . acetaminophen (TYLENOL) 500 MG tablet Take 500 mg by mouth every 6 (six) hours as needed. Reported on 02/03/2016    . Alum Hydroxide-Mag Carbonate (GAVISCON PO) Take 2 tablets by mouth daily as needed (for heartburn). Reported on 02/03/2016    . carvedilol (COREG) 3.125 MG tablet Take 1 tablet (3.125 mg total) by mouth 2 (two) times daily. 60 tablet 6  . Ibuprofen-Diphenhydramine Cit (ADVIL PM PO) Take by mouth. Reported on 01/05/2016    . lidocaine-prilocaine (EMLA) cream Apply 1 application topically as needed. 30 g 1  . non-metallic deodorant (ALRA) MISC Apply 1 application topically daily as  needed.    . ondansetron (ZOFRAN-ODT) 4 MG disintegrating tablet Take 1 tablet (4 mg total) by mouth every 6 (six) hours as needed. Reported on 12/13/2015  (Patient not taking: Reported on 02/03/2016) 20 tablet 5  . permethrin (ELIMITE) 5 % cream Reported on 01/05/2016     No current facility-administered medications for this visit.    PHYSICAL EXAMINATION: ECOG PERFORMANCE STATUS: 1 - Symptomatic but completely ambulatory  Filed Vitals:   02/03/16 1346  BP: 140/76  Pulse: 90  Temp: 98.4 F (36.9 C)  Resp: 18   Filed Weights   02/03/16 1346  Weight: 256 lb 14.4 oz (116.529 kg)    GENERAL:alert, no distress and comfortable SKIN: skin color, texture, turgor are normal, no rashes or significant lesions EYES: normal, Conjunctiva are pink and non-injected, sclera clear OROPHARYNX:no exudate, no erythema and lips, buccal mucosa, and tongue normal  NECK: supple, thyroid normal size, non-tender, without nodularity LYMPH:  no palpable lymphadenopathy in the cervical, axillary or inguinal LUNGS: clear to auscultation and percussion with normal breathing effort HEART: regular rate & rhythm and no murmurs and no lower extremity edema ABDOMEN:abdomen soft, non-tender and normal bowel sounds MUSCULOSKELETAL:no cyanosis of digits and no clubbing  NEURO: alert & oriented x 3 with fluent speech, no focal motor/sensory deficits EXTREMITIES: No lower extremity edema   LABORATORY DATA:  I have reviewed the data as listed   Chemistry      Component Value Date/Time   NA 143 02/03/2016 1333   NA 139 01/03/2016 0855   K 4.7 02/03/2016 1333   K 3.9 01/03/2016 0855   CL 102 01/03/2016 0855   CO2 28 02/03/2016 1333   CO2 25 01/03/2016 0849   BUN 7.4 02/03/2016 1333   BUN 7 01/03/2016 0855   CREATININE 0.8 02/03/2016 1333   CREATININE 0.80 01/03/2016 0855      Component Value Date/Time   CALCIUM 9.7 02/03/2016 1333   CALCIUM 8.7* 01/03/2016 0849   ALKPHOS 90 02/03/2016 1333   ALKPHOS 68 04/14/2014 1108   AST 27 02/03/2016 1333   AST 14 04/14/2014 1108   ALT 43 02/03/2016 1333   ALT 15 04/14/2014 1108   BILITOT 0.41 02/03/2016 1333    BILITOT 0.2* 04/14/2014 1108       Lab Results  Component Value Date   WBC 7.1 02/03/2016   HGB 14.3 02/03/2016   HCT 42.8 02/03/2016   MCV 90.5 02/03/2016   PLT 245 02/03/2016   NEUTROABS 4.7 02/03/2016     ASSESSMENT & PLAN:  Cancer of central portion of left female breast T1 C. N0 M0 stage IA ER 100%, PR 95%, HER-2 negative ratio 1.9, Ki-67 15% with additional satellite nodule 0.6 cm: Oncotype DX recurrence score is 13 (9% risk of recurrence)  Antiestrogen therapy: Patient took tamoxifen from 05/26/2014 to 06/07/2014 and stopped it because of itching. Patient had undergone oophorectomy with TAH for reduction of risk of ovarian cancer. She never resumed anastrozole. She decided on her own to not take antiestrogen therapy at that time.  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 Adjuvant radiation started 11/24/2015- completed 01/14/2016 2. Herceptin every 3 weeks started 11/08/2015 with anastrozole. Because of decline in the heart ejection fraction, Herceptin will be on hold for the next 6 weeks. She will have another echocardiogram and if that is normal she can resume Herceptin.  Herceptin  toxicities: 1. First infusion reaction with shaking chills and rigors 2. Monitoring every 3 months with echocardiograms I recommended that she start anastrozole and on July 10. Return to clinic in August for follow-up.   No orders of the defined types were placed in this encounter.   The patient has a good understanding of the overall plan. she agrees with it. she will call with any problems that may develop before the next visit here.   Rulon Eisenmenger, MD 02/03/2016

## 2016-02-03 NOTE — Assessment & Plan Note (Signed)
T1 C. N0 M0 stage IA ER 100%, PR 95%, HER-2 negative ratio 1.9, Ki-67 15% with additional satellite nodule 0.6 cm: Oncotype DX recurrence score is 13 (9% risk of recurrence)  Antiestrogen therapy: Patient took tamoxifen from 05/26/2014 to 06/07/2014 and stopped it because of itching. Patient had undergone oophorectomy with TAH for reduction of risk of ovarian cancer. She never resumed anastrozole. She decided on her own to not take antiestrogen therapy at that time.  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 Adjuvant radiation started 11/24/2015- completed 01/14/2016 2. Herceptin every 3 weeks started 11/08/2015 with anastrozole  Herceptin toxicities: 1. First infusion reaction with shaking chills and rigors 2. Monitoring every 3 months with echocardiograms I discontinued steroids because of steroids make her extremely restless  Return to clinic in 6 weeks for follow-up.

## 2016-02-03 NOTE — Progress Notes (Signed)
*  PRELIMINARY RESULTS* Echocardiogram 2D Echocardiogram has been performed.  Krystal [name] 02/03/2016, 10:57 AM

## 2016-02-17 ENCOUNTER — Telehealth (HOSPITAL_COMMUNITY): Payer: Self-pay | Admitting: Vascular Surgery

## 2016-02-17 NOTE — Telephone Encounter (Signed)
Left message to make f/u appt w/ DB W/ echo

## 2016-02-24 ENCOUNTER — Other Ambulatory Visit: Payer: Commercial Managed Care - PPO

## 2016-02-24 ENCOUNTER — Ambulatory Visit: Payer: Commercial Managed Care - PPO

## 2016-03-13 ENCOUNTER — Ambulatory Visit (HOSPITAL_BASED_OUTPATIENT_CLINIC_OR_DEPARTMENT_OTHER): Payer: Commercial Managed Care - PPO | Admitting: Hematology and Oncology

## 2016-03-13 ENCOUNTER — Encounter: Payer: Self-pay | Admitting: Hematology and Oncology

## 2016-03-13 ENCOUNTER — Telehealth: Payer: Self-pay | Admitting: Hematology and Oncology

## 2016-03-13 DIAGNOSIS — C44501 Unspecified malignant neoplasm of skin of breast: Secondary | ICD-10-CM | POA: Diagnosis not present

## 2016-03-13 DIAGNOSIS — C50112 Malignant neoplasm of central portion of left female breast: Secondary | ICD-10-CM

## 2016-03-13 MED ORDER — ANASTROZOLE 1 MG PO TABS: 1.0000 mg | ORAL_TABLET | Freq: Every day | ORAL | 3 refills | Status: DC

## 2016-03-13 NOTE — Telephone Encounter (Signed)
per pof to sch pt appt-cld & left pt a message of time & date of appt °

## 2016-03-13 NOTE — Assessment & Plan Note (Signed)
T1 C. N0 M0 stage IA ER 100%, PR 95%, HER-2 negative ratio 1.9, Ki-67 15% with additional satellite nodule 0.6 cm: Oncotype DX recurrence score is 13 (9% risk of recurrence)  Antiestrogen therapy: Patient took tamoxifen from 05/26/2014 to 06/07/2014 and stopped it because of itching. Patient had undergone oophorectomy with TAH for reduction of risk of ovarian cancer. She never resumed anastrozole. She decided on her own to not take antiestrogen therapy at that time.  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 Adjuvant radiation started 11/24/2015- completed 01/14/2016 2. Herceptin every 3 weeks started 11/08/2015 with anastrozole. Because of decline in the heart ejection fraction, Herceptin will be on hold for the next 6 weeks. She will have another echocardiogram and if that is normal she can resume Herceptin.  Herceptin toxicities: 1. First infusion reaction with shaking chills and rigors 2. Decline in the heart ejection fraction: Herceptin has been on hold for 6 weeks. She will have another echocardiogram and if that is normal she can resume Herceptin.  Current treatment: 1. Anastrozole started 02/14/2016 2. Herceptin on hold until echocardiogram   Anastrozole toxicities:   Return to clinic  after Herceptin restarts every 6 weeks for clinic follow-up and every 3 weeks for Herceptin.

## 2016-03-13 NOTE — Progress Notes (Signed)
Patient Care Team: [name] as PCP - General (General Practice)  DIAGNOSIS: Cancer of central portion of left female breast ([place])   Staging form: Breast, AJCC 7th Edition   - Clinical: No stage assigned - Unsigned   - Pathologic: Stage IA (T1c, N0, cM0) - Signed by Rulon Eisenmenger, MD on 05/12/2014  SUMMARY OF ONCOLOGIC HISTORY:   Cancer of central portion of left female breast ([place])   04/16/2014 Surgery    Bilateral mastectomies: Left breast : Multifocal invasive ductal carcinoma positive for lymphovascular invasion, 1.2 cm and 0.6 cm, grade 3, high-grade DCIS with comedonecrosis, 4 SLN negative T1 C. N0 M0 stage IA: Oncotype DX 13 (ROR 9%)     04/16/2014 Surgery    Left upper back melanoma resected no residual melanoma identified on re-resection margins negative     05/26/2014 - 06/08/2015 Anti-estrogen oral therapy    Tamoxifen 20 mg daily with a plan to switch her to aromatase inhibitors once she gets oophorectomy ( patient did not continue antiestrogen therapy by choice)     08/13/2014 Surgery    oophorectomy with total abdominal hysterectomy     09/08/2015 -  Anti-estrogen oral therapy    Resumed anastrozole after she found recurrence of breast cancer     09/16/2015 Surgery    Skin, left: IDC grade 3; 0.5 cm, margins negative, ER 95%, PR 10%, HER-2 positive ratio 2.08     11/08/2015 -  Chemotherapy    Herceptin every 3 weeks plus anastrozole     11/24/2015 - 01/14/2016 Radiation Therapy    Adjuvant radiation therapy by Dr. Sondra Come      CHIEF COMPLIANT: cycle 10 Abraxane  INTERVAL HISTORY: Krystal [name] is a 50 year old with above-mentioned history of left breast cancer underwent bilateral mastectomies and was diagnosed with recurrence under the skin that was HER-2 positive. She underwent resection and is currently on Herceptin with anastrozole. Unfortunately the Herceptin had to be stopped because of decline in heart ejection fraction.She is scheduled to undergo another ECHO  later this month to know whether we can resume her treatment. She is currently off anastrozole. I am sending her a new prescription for anastrozole today.  REVIEW OF SYSTEMS:   Constitutional: Denies fevers, chills or abnormal weight loss Eyes: Denies blurriness of vision Ears, nose, mouth, throat, and face: Denies mucositis or sore throat Respiratory: Denies cough, dyspnea or wheezes Cardiovascular: Denies palpitation, chest discomfort Gastrointestinal:  Denies nausea, heartburn or change in bowel habits Skin: Denies abnormal skin rashes Lymphatics: Denies new lymphadenopathy or easy bruising Neurological:Denies numbness, tingling or new weaknesses Behavioral/Psych: Mood is stable, no new changes  Extremities: No lower extremity edema Breast:  denies any pain or lumps or nodules in either breasts All other systems were reviewed with the patient and are negative.  I have reviewed the past medical history, past surgical history, social history and family history with the patient and they are unchanged from previous note.  ALLERGIES:  is allergic to dilaudid [hydromorphone] and codeine.  MEDICATIONS:  Current Outpatient Prescriptions  Medication Sig Dispense Refill  . acetaminophen (TYLENOL) 500 MG tablet Take 500 mg by mouth every 6 (six) hours as needed. Reported on 02/03/2016    . Alum Hydroxide-Mag Carbonate (GAVISCON PO) Take 2 tablets by mouth daily as needed (for heartburn). Reported on 02/03/2016    . carvedilol (COREG) 3.125 MG tablet Take 1 tablet (3.125 mg total) by mouth 2 (two) times daily. 60 tablet 6  . Ibuprofen-Diphenhydramine Cit (ADVIL  PM PO) Take by mouth. Reported on 01/05/2016    . lidocaine-prilocaine (EMLA) cream Apply 1 application topically as needed. 30 g 1  . non-metallic deodorant (ALRA) MISC Apply 1 application topically daily as needed.    . ondansetron (ZOFRAN-ODT) 4 MG disintegrating tablet Take 1 tablet (4 mg total) by mouth every 6 (six) hours as needed.  Reported on 12/13/2015 (Patient not taking: Reported on 02/03/2016) 20 tablet 5  . permethrin (ELIMITE) 5 % cream Reported on 01/05/2016     No current facility-administered medications for this visit.     PHYSICAL EXAMINATION: ECOG PERFORMANCE STATUS: 1 - Symptomatic but completely ambulatory  Vitals:   03/13/16 0951  BP: 126/84  Pulse: 82  Resp: 18  Temp: 98.8 F (37.1 C)   Filed Weights   03/13/16 0951  Weight: 251 lb 11.2 oz (114.2 kg)    GENERAL:alert, no distress and comfortable SKIN: skin color, texture, turgor are normal, no rashes or significant lesions EYES: normal, Conjunctiva are pink and non-injected, sclera clear OROPHARYNX:no exudate, no erythema and lips, buccal mucosa, and tongue normal  NECK: supple, thyroid normal size, non-tender, without nodularity LYMPH:  no palpable lymphadenopathy in the cervical, axillary or inguinal LUNGS: clear to auscultation and percussion with normal breathing effort HEART: regular rate & rhythm and no murmurs and no lower extremity edema ABDOMEN:abdomen soft, non-tender and normal bowel sounds MUSCULOSKELETAL:no cyanosis of digits and no clubbing  NEURO: alert & oriented x 3 with fluent speech, no focal motor/sensory deficits EXTREMITIES: No lower extremity edema  LABORATORY DATA:  I have reviewed the data as listed   Chemistry      Component Value Date/Time   NA 143 02/03/2016 1333   K 4.7 02/03/2016 1333   CL 102 01/03/2016 0855   CO2 28 02/03/2016 1333   BUN 7.4 02/03/2016 1333   CREATININE 0.8 02/03/2016 1333      Component Value Date/Time   CALCIUM 9.7 02/03/2016 1333   ALKPHOS 90 02/03/2016 1333   AST 27 02/03/2016 1333   ALT 43 02/03/2016 1333   BILITOT 0.41 02/03/2016 1333       Lab Results  Component Value Date   WBC 7.1 02/03/2016   HGB 14.3 02/03/2016   HCT 42.8 02/03/2016   MCV 90.5 02/03/2016   PLT 245 02/03/2016   NEUTROABS 4.7 02/03/2016     ASSESSMENT & PLAN:  Cancer of central portion of  left female breast T1 C. N0 M0 stage IA ER 100%, PR 95%, HER-2 negative ratio 1.9, Ki-67 15% with additional satellite nodule 0.6 cm: Oncotype DX recurrence score is 13 (9% risk of recurrence)  Antiestrogen therapy: Patient took tamoxifen from 05/26/2014 to 06/07/2014 and stopped it because of itching. Patient had undergone oophorectomy with TAH for reduction of risk of ovarian cancer. She never resumed anastrozole. She decided on her own to not take antiestrogen therapy at that time.  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 Adjuvant radiation started 11/24/2015- completed 01/14/2016 2. Herceptin every 3 weeks started 11/08/2015 with anastrozole. Because of decline in the heart ejection fraction, Herceptin will be on hold for the next 6 weeks. She will have another echocardiogram and if that is normal she can resume Herceptin.  Herceptin toxicities: 1. First infusion reaction with shaking chills and rigors 2. Decline in the heart ejection fraction: Herceptin has been on hold for 6 weeks.  She will have another echocardiogram and if that is normal she can resume Herceptin.  Current treatment: 1. Anastrozole restarted 03/13/2016 2. Herceptin on hold until echocardiogram   Anastrozole toxicities:  Previously she had myalgias or hot flashes.  Return to clinic after Herceptin restarts.  I will see the patient in 3 months for follow-up  No orders of the defined types were placed in this encounter.  The patient has a good understanding of the overall plan. she agrees with it. she will call with any problems that may develop before the next visit here.   Rulon Eisenmenger, MD 03/13/16

## 2016-03-16 ENCOUNTER — Other Ambulatory Visit: Payer: Commercial Managed Care - PPO

## 2016-03-16 ENCOUNTER — Ambulatory Visit: Payer: Commercial Managed Care - PPO

## 2016-03-24 ENCOUNTER — Ambulatory Visit (HOSPITAL_COMMUNITY)
Admission: RE | Admit: 2016-03-24 | Discharge: 2016-03-24 | Disposition: A | Discharge status: Home / Self Care | Payer: Commercial Managed Care - PPO | Source: Ambulatory Visit | Attending: Cardiology | Admitting: Cardiology

## 2016-03-24 ENCOUNTER — Other Ambulatory Visit (HOSPITAL_COMMUNITY): Payer: Self-pay | Admitting: Internal Medicine

## 2016-03-24 ENCOUNTER — Ambulatory Visit (HOSPITAL_BASED_OUTPATIENT_CLINIC_OR_DEPARTMENT_OTHER)
Admission: RE | Admit: 2016-03-24 | Discharge: 2016-03-24 | Disposition: A | Discharge status: Home / Self Care | Payer: Commercial Managed Care - PPO | Source: Ambulatory Visit | Attending: Internal Medicine | Admitting: Internal Medicine

## 2016-03-24 VITALS — BP 126/80 | HR 79 | Wt 221.0 lb

## 2016-03-24 DIAGNOSIS — T451X5A Adverse effect of antineoplastic and immunosuppressive drugs, initial encounter: Secondary | ICD-10-CM

## 2016-03-24 DIAGNOSIS — C50112 Malignant neoplasm of central portion of left female breast: Secondary | ICD-10-CM

## 2016-03-24 DIAGNOSIS — I517 Cardiomegaly: Secondary | ICD-10-CM | POA: Insufficient documentation

## 2016-03-24 DIAGNOSIS — I427 Cardiomyopathy due to drug and external agent: Secondary | ICD-10-CM

## 2016-03-24 DIAGNOSIS — I509 Heart failure, unspecified: Secondary | ICD-10-CM | POA: Insufficient documentation

## 2016-03-24 DIAGNOSIS — I351 Nonrheumatic aortic (valve) insufficiency: Secondary | ICD-10-CM | POA: Diagnosis not present

## 2016-03-24 LAB — ECHOCARDIOGRAM LIMITED
E decel time: 141 msec
E/e' ratio: 5.91
FS: 29 % (ref 28–44)
IVS/LV PW RATIO, ED: 0.93
LA ID, A-P, ES: 40 mm
LA diam end sys: 40 mm
LA diam index: 1.85 cm/m2
LV E/e' medial: 5.91
LV E/e'average: 5.91
LV PW d: 9.31 mm — AB (ref 0.6–1.1)
LV e' LATERAL: 11 cm/s
LVOT area: 4.15 cm2
LVOT diameter: 23 mm
MV Dec: 141
MV pk A vel: 73.7 m/s
MV pk E vel: 65 m/s
TDI e' lateral: 11
TDI e' medial: 7.29

## 2016-03-24 MED ORDER — LOSARTAN POTASSIUM 25 MG PO TABS: 25.0000 mg | ORAL_TABLET | Freq: Every day | ORAL | 6 refills | Status: DC

## 2016-03-24 NOTE — Addendum Note (Signed)
Encounter addended by: Scarlette Calico, RN on: 03/24/2016  3:13 PM<BR>    Actions taken: Sign clinical note, Order Entry activity accessed

## 2016-03-24 NOTE — Progress Notes (Signed)
  Echocardiogram 2D Echocardiogram has been performed.  Johny Chess 03/24/2016, 1:53 PM

## 2016-03-24 NOTE — Progress Notes (Signed)
Patient ID: Krystal [name], female   DOB: Jan 24, 1966, 50 y.o.   MRN: [id number]     Cardio-oncology Clinic Progress Note  Referring Physician: Lindi [name] Krystal Care: Krystal [name]:  HPI: INTERVAL HISTORY: Krystal [name] is a 50 year old with above-mentioned history of bilateral mastectomies for left-sided multifocal invasive ductal carcinoma that was noticed on Oncotype and we recommended hormonal therapy but she was noncompliant and did not take any antiestrogen therapy. She went a full year without any treatment and felt a lump in the breast while she was undergoing breast reconstruction and fat grafting. Lap C was then performed which came back as recurrent invasive ductal carcinoma. She underwent resection on 09/16/2015 by Krystal [name] and final pathology revealed that this was an ER/PR and HER-2 positive breast cancer. Because of this she was referred back was performed discussion. She tells me that she resume taking anastrozole about a month ago after he was diagnosed with a recurrence. She does complain of arthralgias but otherwise tolerating it fairly well.  She presents today for close follow up. 50  Herceptin held at last visit with mild cardiotoxicity.   Started on coreg and losartan but losartan didn't get started.   Overall feeling ok. Tired at times. Denies SOB/orthopnea. 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 Going to [employer].     Cancer of central portion of left female breast (Krystal [name])   04/16/2014 Surgery Bilateral mastectomies: Left breast : Multifocal invasive ductal carcinoma positive for lymphovascular invasion, 1.2 cm and 0.6 cm, grade 3, high-grade DCIS with comedonecrosis, 4 SLN negative T1 C. N0 M0 stage IA: Oncotype DX 13 (ROR 9%)   04/16/2014 Surgery Left upper back melanoma resected no residual melanoma identified on re-resection margins negative   05/26/2014 - 08/03/2015 Anti-estrogen oral therapy Tamoxifen 20 mg daily with a plan to switch her to  aromatase inhibitors once she gets oophorectomy ( patient did not continue antiestrogen therapy by choice)   08/13/2014 Surgery oophorectomy with total abdominal hysterectomy   09/08/2015 -  Anti-estrogen oral therapy Resumed anastrozole after she found recurrence of breast cancer   09/16/2015 Surgery Skin, left: IDC grade 3; 0.5 cm, margins negative    11/08/2015 -  Chemotherapy Herceptin every 3 weeks plus anastrozole               3/17   Echo 60-65% Lat S' 10.8 GLS -20 02/03/16 Echo 45-50% Lat S' 9.8 GLS -17.1 03/24/16 Echo 55-60%, Grade 1, Lat S' 11.7, GLS -20.3  Past Medical History:  Diagnosis Date  . Arthritis    shoulders  . BRCA1 positive    BRCA1 [id number]  . Chest wall mass 09/2015  . Complication of anesthesia    respiratory depression/arrest with Dilaudid  . Cough with sputum 09/10/2015   clear sputum, per pt.  . Dental crowns present   . GERD (gastroesophageal reflux disease)   . History of breast cancer 2015  . PONV (postoperative nausea and vomiting)   . Stuffy and runny nose 09/10/2015   green drainage from nose, per pt.    Current Outpatient Prescriptions  Medication Sig Dispense Refill  . acetaminophen (TYLENOL) 500 MG tablet Take 500 mg by mouth every 6 (six) hours as needed. Reported on 02/03/2016    . Alum Hydroxide-Mag Carbonate (GAVISCON PO) Take 2 tablets by mouth daily as needed (for heartburn). Reported on 02/03/2016    . carvedilol (COREG) 3.125 MG tablet Take 1 tablet (3.125 mg total) by mouth 2 (two) times daily. 60 tablet 6  . Ibuprofen-Diphenhydramine  Cit (ADVIL PM PO) Take by mouth. Reported on 01/05/2016    . lidocaine-prilocaine (EMLA) cream Apply 1 application topically as needed. 30 g 1  . non-metallic deodorant (ALRA) MISC Apply 1 application topically daily as needed.    . ondansetron (ZOFRAN-ODT) 4 MG disintegrating tablet Take 1 tablet (4 mg total) by mouth every 6 (six) hours as needed. Reported on 12/13/2015 20 tablet 5  .  permethrin (ELIMITE) 5 % cream Reported on 01/05/2016    . anastrozole (ARIMIDEX) 1 MG tablet Take 1 tablet (1 mg total) by mouth daily. (Patient not taking: Reported on 03-31-16) 90 tablet 3   No current facility-administered medications for this encounter.     Allergies  Allergen Reactions  . Dilaudid [Hydromorphone] Other (See Comments)    RESPIRATORY DEPRESSION  . Codeine Nausea And Vomiting      Social History   Social History  . Marital status: Widowed    Spouse name: N/A  . Number of children: 3  . Years of education: N/A   Occupational History  . Oncologist   Social History Main Topics  . Smoking status: Former Smoker    Packs/day: 0.00    Years: 0.00    Quit date: 04/17/2014  . Smokeless tobacco: Never Used  . Alcohol use Yes     Comment: occasionally  . Drug use: No  . Sexual activity: Not on file   Other Topics Concern  . Not on file   Social History Narrative  . No narrative on file      Family History  Problem Relation Age of Onset  . Breast cancer Paternal Aunt 49    deceased 59s  . Breast cancer Paternal Grandmother     dx 44s; ? if had 2nd BC in 84s; deceased 53s  . Pancreatic cancer Paternal Uncle 49    smoker; deceased    Vitals:   03/31/16 1401  BP: 126/80  Pulse: 79  SpO2: 98%  Weight: 221 lb (100.2 kg)    PHYSICAL EXAM: General:  Well appearing. No respiratory difficulty HEENT: normal Neck: supple. no JVD. Carotids 2+ bilat; no bruits. No thyromegaly or nodule noted.  Cor: PMI nondisplaced. RRR. No M/G/R Lungs: CTAB, normal effort Abdomen: obese soft, not yet diagnosed  Extremities: no cyanosis, clubbing, rash, edema Neuro: alert & oriented x 3, cranial nerves grossly intact. moves all 4 extremities w/o difficulty. Affect pleasant.   ASSESSMENT & PLAN: 1. Breast CA, recurrent HER-2-neu+   - Repeat Echo parameters discussed and reviewed. EF now back to normal.    - Will restart  Herceptin. Retrun for repeat echo in 2 months after 3 more cycles   - Continue carvedilol 3.125 mg twice a day. Add losartan 25 mg daily.    2. Former Smoker- quit April 2017  3. Herceptin-induced cardiomyopathy    - As above  Krystal Bischof,MD 3:06 PM

## 2016-03-24 NOTE — Patient Instructions (Signed)
Start Losartan 25 mg daily  We will contact you in 2 months to schedule your next appointment with echocardiogram

## 2016-03-30 ENCOUNTER — Telehealth: Payer: Self-pay

## 2016-03-30 NOTE — Telephone Encounter (Signed)
Per Dr. Sung Amabile and Dr. Lindi Adie, pt cleared to resume Herceptin treatment.  Called pt to discuss and answered all questions regarding treatment.  Pt notified she will be contacted by our scheduling department for appointment date/time.  Pt verbalized understanding and without further questions or concerns at time of call.

## 2016-03-31 ENCOUNTER — Telehealth: Payer: Self-pay | Admitting: Hematology and Oncology

## 2016-03-31 NOTE — Telephone Encounter (Signed)
Spoke with pt to confirm 9/1 appt date/time per LOS

## 2016-04-06 ENCOUNTER — Telehealth: Payer: Self-pay | Admitting: *Deleted

## 2016-04-06 NOTE — Telephone Encounter (Signed)
I have called and spoke with the patient. Patient to come in earlier tomorrow for treatment

## 2016-04-07 ENCOUNTER — Other Ambulatory Visit (HOSPITAL_BASED_OUTPATIENT_CLINIC_OR_DEPARTMENT_OTHER): Payer: Commercial Managed Care - PPO

## 2016-04-07 ENCOUNTER — Ambulatory Visit (HOSPITAL_BASED_OUTPATIENT_CLINIC_OR_DEPARTMENT_OTHER): Payer: Commercial Managed Care - PPO

## 2016-04-07 VITALS — BP 125/78 | HR 77 | Temp 98.6°F | Wt 253.2 lb

## 2016-04-07 DIAGNOSIS — C50112 Malignant neoplasm of central portion of left female breast: Secondary | ICD-10-CM | POA: Diagnosis not present

## 2016-04-07 DIAGNOSIS — Z5112 Encounter for antineoplastic immunotherapy: Secondary | ICD-10-CM | POA: Diagnosis not present

## 2016-04-07 LAB — CBC WITH DIFFERENTIAL/PLATELET
BASO%: 0.4 % (ref 0.0–2.0)
Basophils Absolute: 0 10*3/uL (ref 0.0–0.1)
EOS%: 4.7 % (ref 0.0–7.0)
Eosinophils Absolute: 0.3 10*3/uL (ref 0.0–0.5)
HCT: 42.9 % (ref 34.8–46.6)
HGB: 14.1 g/dL (ref 11.6–15.9)
LYMPH%: 27.4 % (ref 14.0–49.7)
MCH: 30.2 pg (ref 25.1–34.0)
MCHC: 32.9 g/dL (ref 31.5–36.0)
MCV: 91.9 fL (ref 79.5–101.0)
MONO#: 0.4 10*3/uL (ref 0.1–0.9)
MONO%: 7.1 % (ref 0.0–14.0)
NEUT#: 3.3 10*3/uL (ref 1.5–6.5)
NEUT%: 60.4 % (ref 38.4–76.8)
Platelets: 240 10*3/uL (ref 145–400)
RBC: 4.67 10*6/uL (ref 3.70–5.45)
RDW: 13.9 % (ref 11.2–14.5)
WBC: 5.5 10*3/uL (ref 3.9–10.3)
lymph#: 1.5 10*3/uL (ref 0.9–3.3)

## 2016-04-07 LAB — COMPREHENSIVE METABOLIC PANEL
ALT: 36 U/L (ref 0–55)
AST: 22 U/L (ref 5–34)
Albumin: 3.7 g/dL (ref 3.5–5.0)
Alkaline Phosphatase: 93 U/L (ref 40–150)
Anion Gap: 11 mEq/L (ref 3–11)
BUN: 7.6 mg/dL (ref 7.0–26.0)
CO2: 28 mEq/L (ref 22–29)
Calcium: 9.4 mg/dL (ref 8.4–10.4)
Chloride: 105 mEq/L (ref 98–109)
Creatinine: 0.8 mg/dL (ref 0.6–1.1)
EGFR: >90 mL/min/{1.73_m2} (ref >90)
Glucose: 105 mg/dl (ref 70–140)
Potassium: 4.6 mEq/L (ref 3.5–5.1)
Sodium: 144 mEq/L (ref 136–145)
Total Bilirubin: 0.33 mg/dL (ref 0.20–1.20)
Total Protein: 7.7 g/dL (ref 6.4–8.3)

## 2016-04-07 MED ORDER — ACETAMINOPHEN 325 MG PO TABS
ORAL_TABLET | ORAL | Status: AC
  Filled 2016-04-07: qty 2

## 2016-04-07 MED ORDER — ACETAMINOPHEN 325 MG PO TABS
650.0000 mg | ORAL_TABLET | Freq: Once | ORAL | Status: AC
  Administered 2016-04-07: 650 mg via ORAL

## 2016-04-07 MED ORDER — TRASTUZUMAB CHEMO INJECTION 440 MG
8.0000 mg/kg | Freq: Once | INTRAVENOUS | Status: AC
  Administered 2016-04-07: 924 mg via INTRAVENOUS
  Filled 2016-04-07: qty 44

## 2016-04-07 MED ORDER — SODIUM CHLORIDE 0.9% FLUSH
10.0000 mL | INTRAVENOUS | Status: DC | PRN
  Administered 2016-04-07: 10 mL
  Filled 2016-04-07: qty 10

## 2016-04-07 MED ORDER — DIPHENHYDRAMINE HCL 25 MG PO CAPS
50.0000 mg | ORAL_CAPSULE | Freq: Once | ORAL | Status: AC
  Administered 2016-04-07: 50 mg via ORAL

## 2016-04-07 MED ORDER — SODIUM CHLORIDE 0.9 % IV SOLN
Freq: Once | INTRAVENOUS | Status: AC
  Administered 2016-04-07: 15:00:00 via INTRAVENOUS

## 2016-04-07 MED ORDER — HEPARIN SOD (PORK) LOCK FLUSH 100 UNIT/ML IV SOLN
500.0000 [IU] | Freq: Once | INTRAVENOUS | Status: AC | PRN
  Administered 2016-04-07: 500 [IU]
  Filled 2016-04-07: qty 5

## 2016-04-07 MED ORDER — DIPHENHYDRAMINE HCL 25 MG PO CAPS
ORAL_CAPSULE | ORAL | Status: AC
  Filled 2016-04-07: qty 2

## 2016-04-07 NOTE — Patient Instructions (Signed)
[facility] Discharge Instructions for Patients Receiving Chemotherapy  Today you received the following chemotherapy agents Herceptin  To help prevent nausea and vomiting after your treatment, we encourage you to take your nausea medication  As directed   If you develop nausea and vomiting that is not controlled by your nausea medication, call the clinic.   BELOW ARE SYMPTOMS THAT SHOULD BE REPORTED IMMEDIATELY:  *FEVER GREATER THAN 100.5 F  *CHILLS WITH OR WITHOUT FEVER  NAUSEA AND VOMITING THAT IS NOT CONTROLLED WITH YOUR NAUSEA MEDICATION  *UNUSUAL SHORTNESS OF BREATH  *UNUSUAL BRUISING OR BLEEDING  TENDERNESS IN MOUTH AND THROAT WITH OR WITHOUT PRESENCE OF ULCERS  *URINARY PROBLEMS  *BOWEL PROBLEMS  UNUSUAL RASH Items with * indicate a potential emergency and should be followed up as soon as possible.  Feel free to call the clinic you have any questions or concerns. The clinic phone number is [phone].  Please show the CHEMO ALERT CARD at check-in to the Emergency Department and triage nurse.   

## 2016-04-28 ENCOUNTER — Other Ambulatory Visit (HOSPITAL_BASED_OUTPATIENT_CLINIC_OR_DEPARTMENT_OTHER): Payer: Commercial Managed Care - PPO

## 2016-04-28 ENCOUNTER — Ambulatory Visit (HOSPITAL_BASED_OUTPATIENT_CLINIC_OR_DEPARTMENT_OTHER): Payer: Commercial Managed Care - PPO

## 2016-04-28 VITALS — BP 134/75 | HR 79 | Temp 98.8°F | Resp 18

## 2016-04-28 DIAGNOSIS — C44501 Unspecified malignant neoplasm of skin of breast: Secondary | ICD-10-CM

## 2016-04-28 DIAGNOSIS — C50112 Malignant neoplasm of central portion of left female breast: Secondary | ICD-10-CM

## 2016-04-28 DIAGNOSIS — Z5112 Encounter for antineoplastic immunotherapy: Secondary | ICD-10-CM

## 2016-04-28 LAB — COMPREHENSIVE METABOLIC PANEL
ALT: 32 U/L (ref 0–55)
AST: 19 U/L (ref 5–34)
Albumin: 3.7 g/dL (ref 3.5–5.0)
Alkaline Phosphatase: 103 U/L (ref 40–150)
Anion Gap: 11 mEq/L (ref 3–11)
BUN: 9.5 mg/dL (ref 7.0–26.0)
CO2: 27 mEq/L (ref 22–29)
Calcium: 9.1 mg/dL (ref 8.4–10.4)
Chloride: 104 mEq/L (ref 98–109)
Creatinine: 0.8 mg/dL (ref 0.6–1.1)
EGFR: 83 mL/min/{1.73_m2} — ABNORMAL LOW (ref >90)
Glucose: 142 mg/dl — ABNORMAL HIGH (ref 70–140)
Potassium: 4.3 mEq/L (ref 3.5–5.1)
Sodium: 142 mEq/L (ref 136–145)
Total Bilirubin: 0.38 mg/dL (ref 0.20–1.20)
Total Protein: 7.5 g/dL (ref 6.4–8.3)

## 2016-04-28 LAB — CBC WITH DIFFERENTIAL/PLATELET
BASO%: 1 % (ref 0.0–2.0)
Basophils Absolute: 0.1 10*3/uL (ref 0.0–0.1)
EOS%: 5.2 % (ref 0.0–7.0)
Eosinophils Absolute: 0.3 10*3/uL (ref 0.0–0.5)
HCT: 41.9 % (ref 34.8–46.6)
HGB: 13.8 g/dL (ref 11.6–15.9)
LYMPH%: 23.2 % (ref 14.0–49.7)
MCH: 29.5 pg (ref 25.1–34.0)
MCHC: 32.9 g/dL (ref 31.5–36.0)
MCV: 89.7 fL (ref 79.5–101.0)
MONO#: 0.4 10*3/uL (ref 0.1–0.9)
MONO%: 7.2 % (ref 0.0–14.0)
NEUT#: 3.4 10*3/uL (ref 1.5–6.5)
NEUT%: 63.4 % (ref 38.4–76.8)
Platelets: 257 10*3/uL (ref 145–400)
RBC: 4.67 10*6/uL (ref 3.70–5.45)
RDW: 14.2 % (ref 11.2–14.5)
WBC: 5.4 10*3/uL (ref 3.9–10.3)
lymph#: 1.3 10*3/uL (ref 0.9–3.3)

## 2016-04-28 MED ORDER — TRASTUZUMAB CHEMO 150 MG IV SOLR
6.0000 mg/kg | Freq: Once | INTRAVENOUS | Status: AC
  Administered 2016-04-28: 693 mg via INTRAVENOUS
  Filled 2016-04-28: qty 33

## 2016-04-28 MED ORDER — ACETAMINOPHEN 325 MG PO TABS
650.0000 mg | ORAL_TABLET | Freq: Once | ORAL | Status: AC
  Administered 2016-04-28: 650 mg via ORAL

## 2016-04-28 MED ORDER — ACETAMINOPHEN 325 MG PO TABS
ORAL_TABLET | ORAL | Status: AC
  Filled 2016-04-28: qty 2

## 2016-04-28 MED ORDER — DIPHENHYDRAMINE HCL 25 MG PO CAPS
ORAL_CAPSULE | ORAL | Status: AC
  Filled 2016-04-28: qty 2

## 2016-04-28 MED ORDER — DIPHENHYDRAMINE HCL 25 MG PO CAPS
50.0000 mg | ORAL_CAPSULE | Freq: Once | ORAL | Status: AC
  Administered 2016-04-28: 50 mg via ORAL

## 2016-04-28 MED ORDER — HEPARIN SOD (PORK) LOCK FLUSH 100 UNIT/ML IV SOLN
500.0000 [IU] | Freq: Once | INTRAVENOUS | Status: AC | PRN
  Administered 2016-04-28: 500 [IU]
  Filled 2016-04-28: qty 5

## 2016-04-28 MED ORDER — SODIUM CHLORIDE 0.9 % IV SOLN
Freq: Once | INTRAVENOUS | Status: AC
  Administered 2016-04-28: 15:00:00 via INTRAVENOUS

## 2016-04-28 MED ORDER — SODIUM CHLORIDE 0.9% FLUSH
10.0000 mL | INTRAVENOUS | Status: DC | PRN
  Administered 2016-04-28: 10 mL
  Filled 2016-04-28: qty 10

## 2016-04-28 NOTE — Patient Instructions (Signed)
[facility] Discharge Instructions for Patients Receiving Chemotherapy  Today you received the following chemotherapy agents Herceptin  To help prevent nausea and vomiting after your treatment, we encourage you to take your nausea medication  As directed   If you develop nausea and vomiting that is not controlled by your nausea medication, call the clinic.   BELOW ARE SYMPTOMS THAT SHOULD BE REPORTED IMMEDIATELY:  *FEVER GREATER THAN 100.5 F  *CHILLS WITH OR WITHOUT FEVER  NAUSEA AND VOMITING THAT IS NOT CONTROLLED WITH YOUR NAUSEA MEDICATION  *UNUSUAL SHORTNESS OF BREATH  *UNUSUAL BRUISING OR BLEEDING  TENDERNESS IN MOUTH AND THROAT WITH OR WITHOUT PRESENCE OF ULCERS  *URINARY PROBLEMS  *BOWEL PROBLEMS  UNUSUAL RASH Items with * indicate a potential emergency and should be followed up as soon as possible.  Feel free to call the clinic you have any questions or concerns. The clinic phone number is [phone].  Please show the CHEMO ALERT CARD at check-in to the Emergency Department and triage nurse.   

## 2016-05-19 ENCOUNTER — Other Ambulatory Visit (HOSPITAL_BASED_OUTPATIENT_CLINIC_OR_DEPARTMENT_OTHER): Payer: Commercial Managed Care - PPO

## 2016-05-19 ENCOUNTER — Ambulatory Visit (HOSPITAL_BASED_OUTPATIENT_CLINIC_OR_DEPARTMENT_OTHER): Payer: Commercial Managed Care - PPO

## 2016-05-19 VITALS — BP 123/81 | HR 81 | Temp 98.4°F | Resp 18

## 2016-05-19 DIAGNOSIS — Z5112 Encounter for antineoplastic immunotherapy: Secondary | ICD-10-CM | POA: Diagnosis not present

## 2016-05-19 DIAGNOSIS — C50112 Malignant neoplasm of central portion of left female breast: Secondary | ICD-10-CM

## 2016-05-19 DIAGNOSIS — C44501 Unspecified malignant neoplasm of skin of breast: Secondary | ICD-10-CM | POA: Diagnosis not present

## 2016-05-19 LAB — CBC WITH DIFFERENTIAL/PLATELET
BASO%: 0.5 % (ref 0.0–2.0)
Basophils Absolute: 0 10*3/uL (ref 0.0–0.1)
EOS%: 4.3 % (ref 0.0–7.0)
Eosinophils Absolute: 0.3 10*3/uL (ref 0.0–0.5)
HCT: 42 % (ref 34.8–46.6)
HGB: 13.9 g/dL (ref 11.6–15.9)
LYMPH%: 22 % (ref 14.0–49.7)
MCH: 29.7 pg (ref 25.1–34.0)
MCHC: 33.1 g/dL (ref 31.5–36.0)
MCV: 89.8 fL (ref 79.5–101.0)
MONO#: 0.4 10*3/uL (ref 0.1–0.9)
MONO%: 6.7 % (ref 0.0–14.0)
NEUT#: 4 10*3/uL (ref 1.5–6.5)
NEUT%: 66.5 % (ref 38.4–76.8)
Platelets: 247 10*3/uL (ref 145–400)
RBC: 4.68 10*6/uL (ref 3.70–5.45)
RDW: 13.7 % (ref 11.2–14.5)
WBC: 6 10*3/uL (ref 3.9–10.3)
lymph#: 1.3 10*3/uL (ref 0.9–3.3)

## 2016-05-19 LAB — COMPREHENSIVE METABOLIC PANEL
ALT: 32 U/L (ref 0–55)
AST: 18 U/L (ref 5–34)
Albumin: 3.7 g/dL (ref 3.5–5.0)
Alkaline Phosphatase: 95 U/L (ref 40–150)
Anion Gap: 10 mEq/L (ref 3–11)
BUN: 10.8 mg/dL (ref 7.0–26.0)
CO2: 28 mEq/L (ref 22–29)
Calcium: 9.2 mg/dL (ref 8.4–10.4)
Chloride: 104 mEq/L (ref 98–109)
Creatinine: 0.8 mg/dL (ref 0.6–1.1)
EGFR: >90 mL/min/{1.73_m2} (ref >90)
Glucose: 130 mg/dl (ref 70–140)
Potassium: 4.1 mEq/L (ref 3.5–5.1)
Sodium: 142 mEq/L (ref 136–145)
Total Bilirubin: 0.38 mg/dL (ref 0.20–1.20)
Total Protein: 7.5 g/dL (ref 6.4–8.3)

## 2016-05-19 MED ORDER — TRASTUZUMAB CHEMO 150 MG IV SOLR
6.0000 mg/kg | Freq: Once | INTRAVENOUS | Status: AC
  Administered 2016-05-19: 693 mg via INTRAVENOUS
  Filled 2016-05-19: qty 33

## 2016-05-19 MED ORDER — ACETAMINOPHEN 325 MG PO TABS
650.0000 mg | ORAL_TABLET | Freq: Once | ORAL | Status: AC
  Administered 2016-05-19: 650 mg via ORAL

## 2016-05-19 MED ORDER — DIPHENHYDRAMINE HCL 25 MG PO CAPS
ORAL_CAPSULE | ORAL | Status: AC
  Filled 2016-05-19: qty 2

## 2016-05-19 MED ORDER — SODIUM CHLORIDE 0.9 % IV SOLN
Freq: Once | INTRAVENOUS | Status: AC
  Administered 2016-05-19: 15:00:00 via INTRAVENOUS

## 2016-05-19 MED ORDER — DIPHENHYDRAMINE HCL 25 MG PO CAPS
50.0000 mg | ORAL_CAPSULE | Freq: Once | ORAL | Status: AC
  Administered 2016-05-19: 50 mg via ORAL

## 2016-05-19 MED ORDER — SODIUM CHLORIDE 0.9% FLUSH
10.0000 mL | INTRAVENOUS | Status: DC | PRN
  Administered 2016-05-19: 10 mL
  Filled 2016-05-19: qty 10

## 2016-05-19 MED ORDER — HEPARIN SOD (PORK) LOCK FLUSH 100 UNIT/ML IV SOLN
500.0000 [IU] | Freq: Once | INTRAVENOUS | Status: AC | PRN
  Administered 2016-05-19: 500 [IU]
  Filled 2016-05-19: qty 5

## 2016-05-19 MED ORDER — ACETAMINOPHEN 325 MG PO TABS
ORAL_TABLET | ORAL | Status: AC
  Filled 2016-05-19: qty 2

## 2016-05-19 NOTE — Patient Instructions (Signed)
[facility] Discharge Instructions for Patients Receiving Chemotherapy  Today you received the following chemotherapy agents:  Herceptin  To help prevent nausea and vomiting after your treatment, we encourage you to take your nausea medication as prescribed.   If you develop nausea and vomiting that is not controlled by your nausea medication, call the clinic.   BELOW ARE SYMPTOMS THAT SHOULD BE REPORTED IMMEDIATELY:  *FEVER GREATER THAN 100.5 F  *CHILLS WITH OR WITHOUT FEVER  NAUSEA AND VOMITING THAT IS NOT CONTROLLED WITH YOUR NAUSEA MEDICATION  *UNUSUAL SHORTNESS OF BREATH  *UNUSUAL BRUISING OR BLEEDING  TENDERNESS IN MOUTH AND THROAT WITH OR WITHOUT PRESENCE OF ULCERS  *URINARY PROBLEMS  *BOWEL PROBLEMS  UNUSUAL RASH Items with * indicate a potential emergency and should be followed up as soon as possible.  Feel free to call the clinic you have any questions or concerns. The clinic phone number is [phone].  Please show the CHEMO ALERT CARD at check-in to the Emergency Department and triage nurse.   

## 2016-05-23 ENCOUNTER — Telehealth: Payer: Self-pay | Admitting: *Deleted

## 2016-05-23 NOTE — Telephone Encounter (Signed)
FYI "We have a flu clinic at work until 11:00 am.  I need a letter from Dr. Lindi Adie stating I can receive the vaccine today.  I was told while there for Hereceptin that I can receive the vaccine."   Advised I will verify with provider if she can receive injection.  "I will wait until I return to [facility] to receive the injection."  06-13-2016 next scheduled F/U.

## 2016-05-26 ENCOUNTER — Telehealth (HOSPITAL_COMMUNITY): Payer: Self-pay | Admitting: Vascular Surgery

## 2016-05-26 NOTE — Telephone Encounter (Signed)
Left pt message to resch appt DB will not be in office 05/31/16

## 2016-05-31 ENCOUNTER — Encounter (HOSPITAL_COMMUNITY): Payer: Commercial Managed Care - PPO | Admitting: Internal Medicine

## 2016-05-31 ENCOUNTER — Ambulatory Visit (HOSPITAL_COMMUNITY): Payer: Commercial Managed Care - PPO

## 2016-06-13 ENCOUNTER — Ambulatory Visit: Payer: Commercial Managed Care - PPO | Admitting: Hematology and Oncology

## 2016-06-14 ENCOUNTER — Ambulatory Visit (HOSPITAL_COMMUNITY)
Admission: RE | Admit: 2016-06-14 | Discharge: 2016-06-14 | Disposition: A | Discharge status: Home / Self Care | Payer: Commercial Managed Care - PPO | Source: Ambulatory Visit | Attending: Internal Medicine | Admitting: Internal Medicine

## 2016-06-14 ENCOUNTER — Ambulatory Visit (HOSPITAL_BASED_OUTPATIENT_CLINIC_OR_DEPARTMENT_OTHER)
Admission: RE | Admit: 2016-06-14 | Discharge: 2016-06-14 | Disposition: A | Discharge status: Home / Self Care | Payer: Commercial Managed Care - PPO | Source: Ambulatory Visit | Attending: Internal Medicine | Admitting: Internal Medicine

## 2016-06-14 VITALS — BP 102/64 | HR 80 | Wt 255.5 lb

## 2016-06-14 DIAGNOSIS — Z79899 Other long term (current) drug therapy: Secondary | ICD-10-CM | POA: Diagnosis not present

## 2016-06-14 DIAGNOSIS — Z87891 Personal history of nicotine dependence: Secondary | ICD-10-CM | POA: Diagnosis not present

## 2016-06-14 DIAGNOSIS — C50112 Malignant neoplasm of central portion of left female breast: Secondary | ICD-10-CM | POA: Diagnosis not present

## 2016-06-14 DIAGNOSIS — Z9013 Acquired absence of bilateral breasts and nipples: Secondary | ICD-10-CM | POA: Diagnosis not present

## 2016-06-14 DIAGNOSIS — I427 Cardiomyopathy due to drug and external agent: Secondary | ICD-10-CM | POA: Insufficient documentation

## 2016-06-14 DIAGNOSIS — R5383 Other fatigue: Secondary | ICD-10-CM

## 2016-06-14 DIAGNOSIS — Z1501 Genetic susceptibility to malignant neoplasm of breast: Secondary | ICD-10-CM | POA: Insufficient documentation

## 2016-06-14 DIAGNOSIS — Z885 Allergy status to narcotic agent status: Secondary | ICD-10-CM | POA: Diagnosis not present

## 2016-06-14 DIAGNOSIS — T451X5A Adverse effect of antineoplastic and immunosuppressive drugs, initial encounter: Secondary | ICD-10-CM | POA: Insufficient documentation

## 2016-06-14 DIAGNOSIS — K219 Gastro-esophageal reflux disease without esophagitis: Secondary | ICD-10-CM | POA: Diagnosis not present

## 2016-06-14 DIAGNOSIS — Z79811 Long term (current) use of aromatase inhibitors: Secondary | ICD-10-CM | POA: Diagnosis not present

## 2016-06-14 DIAGNOSIS — C50912 Malignant neoplasm of unspecified site of left female breast: Secondary | ICD-10-CM | POA: Insufficient documentation

## 2016-06-14 MED ORDER — CARVEDILOL 3.125 MG PO TABS: 3.1250 mg | ORAL_TABLET | Freq: Two times a day (BID) | ORAL | 3 refills | Status: DC

## 2016-06-14 NOTE — Patient Instructions (Signed)
Restart Carvedilol 3.125 mg Twice daily   Your physician recommends that you schedule a follow-up appointment in: 2 months with echocardiogram

## 2016-06-14 NOTE — Progress Notes (Signed)
Patient ID: Krystal [name], female   DOB: 1966/06/30, 50 y.o.   MRN: [id number]     Cardio-oncology Clinic Progress Note  Referring Physician: Lindi Adie Primary Care: Primary Cardiologist:  HPI: INTERVAL HISTORY: Krystal [name] is a 50 year old with above-mentioned history of bilateral mastectomies for left-sided multifocal invasive ductal carcinoma that was noticed on Oncotype and we recommended hormonal therapy but she was noncompliant and did not take any antiestrogen therapy. She went a full year without any treatment and felt a lump in the breast while she was undergoing breast reconstruction and fat grafting. Lap C was then performed which came back as recurrent invasive ductal carcinoma. She underwent resection on 09/16/2015 by Dr. Donne Hazel and final pathology revealed that this was an ER/PR and HER-2 positive breast cancer. Because of this she was referred back was performed discussion. She tells me that she resume taking anastrozole about a month ago after he was diagnosed with a recurrence. She does complain of arthralgias but otherwise tolerating it fairly well.  Herceptin held in 6/17 for mild toxicity. Restarted at last visit  She presents today for close follow up.  Herceptin restarted in 8/17. Losartan started. She mistakenly stopped carvedilol. Feels well. Tolerating Herceptin well. Denies SOB/orthopnea. 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 + indigestion and fatigue     Cancer of central portion of left female breast ([place])   04/16/2014 Surgery Bilateral mastectomies: Left breast : Multifocal invasive ductal carcinoma positive for lymphovascular invasion, 1.2 cm and 0.6 cm, grade 3, high-grade DCIS with comedonecrosis, 4 SLN negative T1 C. N0 M0 stage IA: Oncotype DX 13 (ROR 9%)   04/16/2014 Surgery Left upper back melanoma resected no residual melanoma identified on re-resection margins negative   05/26/2014 - 08/03/2015 Anti-estrogen oral therapy Tamoxifen 20 mg daily with a  plan to switch her to aromatase inhibitors once she gets oophorectomy ( patient did not continue antiestrogen therapy by choice)   08/13/2014 Surgery oophorectomy with total abdominal hysterectomy   09/08/2015 -  Anti-estrogen oral therapy Resumed anastrozole after she found recurrence of breast cancer   09/16/2015 Surgery Skin, left: IDC grade 3; 0.5 cm, margins negative    11/08/2015 -  Chemotherapy Herceptin every 3 weeks plus anastrozole               3/17   Echo 60-65% Lat S' 10.8 GLS -20 02/03/16 Echo 45-50% Lat S' 9.8 GLS -17.1 03/24/16 Echo 55-60%, Grade 1, Lat S' 11.7, GLS -20.3 06/14/16 Echo 55/60% Grade 1 DD lat s' 11.0 GLS -17.8%   Past Medical History:  Diagnosis Date  . Arthritis    shoulders  . BRCA1 positive    BRCA1 [id number]  . Chest wall mass 09/2015  . Complication of anesthesia    respiratory depression/arrest with Dilaudid  . Cough with sputum 09/10/2015   clear sputum, per pt.  . Dental crowns present   . GERD (gastroesophageal reflux disease)   . History of breast cancer 2015  . PONV (postoperative nausea and vomiting)   . Stuffy and runny nose 09/10/2015   green drainage from nose, per pt.    Current Outpatient Prescriptions  Medication Sig Dispense Refill  . acetaminophen (TYLENOL) 500 MG tablet Take 500 mg by mouth every 6 (six) hours as needed. Reported on 02/03/2016    . Alum Hydroxide-Mag Carbonate (GAVISCON PO) Take 2 tablets by mouth daily as needed (for heartburn). Reported on 02/03/2016    . anastrozole (ARIMIDEX) 1 MG tablet Take 1 tablet (1 mg total) by mouth  daily. 90 tablet 3  . Ibuprofen-Diphenhydramine Cit (ADVIL PM PO) Take by mouth. Reported on 01/05/2016    . lidocaine-prilocaine (EMLA) cream Apply 1 application topically as needed. 30 g 1  . losartan (COZAAR) 25 MG tablet Take 1 tablet (25 mg total) by mouth daily. 30 tablet 6  . non-metallic deodorant (ALRA) MISC Apply 1 application topically daily as needed.    . ondansetron  (ZOFRAN-ODT) 4 MG disintegrating tablet Take 1 tablet (4 mg total) by mouth every 6 (six) hours as needed. Reported on 12/13/2015 20 tablet 5  . permethrin (ELIMITE) 5 % cream Reported on 01/05/2016     No current facility-administered medications for this encounter.     Allergies  Allergen Reactions  . Dilaudid [Hydromorphone] Other (See Comments)    RESPIRATORY DEPRESSION  . Codeine Nausea And Vomiting      Social History   Social History  . Marital status: Widowed    Spouse name: N/A  . Number of children: 3  . Years of education: N/A   Occupational History  . Oncologist   Social History Main Topics  . Smoking status: Former Smoker    Packs/day: 0.00    Years: 0.00    Quit date: 04/17/2014  . Smokeless tobacco: Never Used  . Alcohol use Yes     Comment: occasionally  . Drug use: No  . Sexual activity: Not on file   Other Topics Concern  . Not on file   Social History Narrative  . No narrative on file      Family History  Problem Relation Age of Onset  . Breast cancer Paternal Aunt 74    deceased 55s  . Breast cancer Paternal Grandmother     dx 77s; ? if had 2nd BC in 67s; deceased 27s  . Pancreatic cancer Paternal Uncle 88    smoker; deceased    Vitals:   18-Jun-2016 1011  BP: 102/64  Pulse: 80  SpO2: 94%  Weight: 255 lb 8 oz (115.9 kg)    PHYSICAL EXAM: General:  Well appearing. No respiratory difficulty HEENT: normal Neck: supple. no JVD. Carotids 2+ bilat; no bruits. No thyromegaly or nodule noted.  Cor: PMI nondisplaced. RRR. No M/G/R Lungs: CTAB, normal effort Abdomen: obese soft, not yet diagnosed  Extremities: no cyanosis, clubbing, rash, edema Neuro: alert & oriented x 3, cranial nerves grossly intact. moves all 4 extremities w/o difficulty. Affect pleasant.   ASSESSMENT & PLAN: 1. Breast CA, recurrent HER-2-neu+   - Repeat Echo parameters discussed and reviewed. EF now back to normal.    -  Will continue Herceptin. Return for repeat echo in 2 months.   - Restart carvedilol 3.125 mg twice a day. Continue losartan 25 mg daily.    2. Former Smoker- quit April 2017  3. Herceptin-induced cardiomyopathy    - As above. Improved. Back on Herceptin .  4. Fatigue    --Suspect OSA. Arrange sleep study.   Nnaemeka Samson,MD 10:57 AM

## 2016-06-14 NOTE — Addendum Note (Signed)
Encounter addended by: Scarlette Calico, RN on: 06/14/2016 11:14 AM<BR>    Actions taken: Order list changed, Diagnosis association updated, Sign clinical note

## 2016-06-14 NOTE — Progress Notes (Signed)
*  PRELIMINARY RESULTS* Echocardiogram 2D Echocardiogram has been performed.  Krystal [name] 06/14/2016, 10:08 AM

## 2016-06-28 NOTE — Progress Notes (Unsigned)
Received [facility] results breast US. Sent to scan

## 2016-07-03 ENCOUNTER — Ambulatory Visit: Payer: Commercial Managed Care - PPO | Admitting: Hematology and Oncology

## 2016-07-03 NOTE — Progress Notes (Deleted)
Patient Care Team: [name] as PCP - General (General Practice)  DIAGNOSIS:  Encounter Diagnosis  Name Primary?  . Malignant neoplasm of central portion of left breast in female, estrogen receptor positive ([place])     SUMMARY OF ONCOLOGIC HISTORY:   Cancer of central portion of left female breast ([place])   04/16/2014 Surgery    Bilateral mastectomies: Left breast : Multifocal invasive ductal carcinoma positive for lymphovascular invasion, 1.2 cm and 0.6 cm, grade 3, high-grade DCIS with comedonecrosis, 4 SLN negative T1 C. N0 M0 stage IA: Oncotype DX 13 (ROR 9%)      04/16/2014 Surgery    Left upper back melanoma resected no residual melanoma identified on re-resection margins negative      05/26/2014 - 06/08/2015 Anti-estrogen oral therapy    Tamoxifen 20 mg daily with a plan to switch her to aromatase inhibitors once she gets oophorectomy ( patient did not continue antiestrogen therapy by choice)      08/13/2014 Surgery    oophorectomy with total abdominal hysterectomy      09/08/2015 -  Anti-estrogen oral therapy    Resumed anastrozole after she found recurrence of breast cancer      09/16/2015 Surgery    Skin, left: IDC grade 3; 0.5 cm, margins negative, ER 95%, PR 10%, HER-2 positive ratio 2.08      11/08/2015 -  Chemotherapy    Herceptin every 3 weeks plus anastrozole      11/24/2015 - 01/14/2016 Radiation Therapy    Adjuvant radiation therapy by Dr. Sondra Come       CHIEF COMPLIANT: Follow-up on anastrozole with Herceptin  INTERVAL HISTORY: Krystal [name] is a 50 year old with above-mentioned history of recurrent breast cancer who is currently on anastrozole with Herceptin. She has resumed Herceptin on 04/07/2016 appears to be tolerating it fairly well. She is also tolerating anastrozole very well. She does have occasional myalgias and arthralgias. Denies any hot flashes  REVIEW OF SYSTEMS:   Constitutional: Denies fevers, chills or abnormal weight loss Eyes: Denies  blurriness of vision Ears, nose, mouth, throat, and face: Denies mucositis or sore throat Respiratory: Denies cough, dyspnea or wheezes Cardiovascular: Denies palpitation, chest discomfort Gastrointestinal:  Denies nausea, heartburn or change in bowel habits Skin: Denies abnormal skin rashes Lymphatics: Denies new lymphadenopathy or easy bruising Neurological:Denies numbness, tingling or new weaknesses Behavioral/Psych: Mood is stable, no new changes  Extremities: No lower extremity edema Breast:  denies any pain or lumps or nodules  All other systems were reviewed with the patient and are negative.  I have reviewed the past medical history, past surgical history, social history and family history with the patient and they are unchanged from previous note.  ALLERGIES:  is allergic to dilaudid [hydromorphone] and codeine.  MEDICATIONS:  Current Outpatient Prescriptions  Medication Sig Dispense Refill  . acetaminophen (TYLENOL) 500 MG tablet Take 500 mg by mouth every 6 (six) hours as needed. Reported on 02/03/2016    . Alum Hydroxide-Mag Carbonate (GAVISCON PO) Take 2 tablets by mouth daily as needed (for heartburn). Reported on 02/03/2016    . anastrozole (ARIMIDEX) 1 MG tablet Take 1 tablet (1 mg total) by mouth daily. 90 tablet 3  . carvedilol (COREG) 3.125 MG tablet Take 1 tablet (3.125 mg total) by mouth 2 (two) times daily. 60 tablet 3  . Ibuprofen-Diphenhydramine Cit (ADVIL PM PO) Take by mouth. Reported on 01/05/2016    . lidocaine-prilocaine (EMLA) cream Apply 1 application topically as needed. 30 g 1  .  losartan (COZAAR) 25 MG tablet Take 1 tablet (25 mg total) by mouth daily. 30 tablet 6  . non-metallic deodorant (ALRA) MISC Apply 1 application topically daily as needed.    . ondansetron (ZOFRAN-ODT) 4 MG disintegrating tablet Take 1 tablet (4 mg total) by mouth every 6 (six) hours as needed. Reported on 12/13/2015 20 tablet 5  . permethrin (ELIMITE) 5 % cream Reported on 01/05/2016      No current facility-administered medications for this visit.     PHYSICAL EXAMINATION: ECOG PERFORMANCE STATUS: 1 - Symptomatic but completely ambulatory  There were no vitals filed for this visit. There were no vitals filed for this visit.  GENERAL:alert, no distress and comfortable SKIN: skin color, texture, turgor are normal, no rashes or significant lesions EYES: normal, Conjunctiva are pink and non-injected, sclera clear OROPHARYNX:no exudate, no erythema and lips, buccal mucosa, and tongue normal  NECK: supple, thyroid normal size, non-tender, without nodularity LYMPH:  no palpable lymphadenopathy in the cervical, axillary or inguinal LUNGS: clear to auscultation and percussion with normal breathing effort HEART: regular rate & rhythm and no murmurs and no lower extremity edema ABDOMEN:abdomen soft, non-tender and normal bowel sounds MUSCULOSKELETAL:no cyanosis of digits and no clubbing  NEURO: alert & oriented x 3 with fluent speech, no focal motor/sensory deficits EXTREMITIES: No lower extremity edema  LABORATORY DATA:  I have reviewed the data as listed   Chemistry      Component Value Date/Time   NA 142 05/19/2016 1436   K 4.1 05/19/2016 1436   CL 102 01/03/2016 0855   CO2 28 05/19/2016 1436   BUN 10.8 05/19/2016 1436   CREATININE 0.8 05/19/2016 1436      Component Value Date/Time   CALCIUM 9.2 05/19/2016 1436   ALKPHOS 95 05/19/2016 1436   AST 18 05/19/2016 1436   ALT 32 05/19/2016 1436   BILITOT 0.38 05/19/2016 1436       Lab Results  Component Value Date   WBC 6.0 05/19/2016   HGB 13.9 05/19/2016   HCT 42.0 05/19/2016   MCV 89.8 05/19/2016   PLT 247 05/19/2016   NEUTROABS 4.0 05/19/2016    ASSESSMENT & PLAN:  Cancer of central portion of left female breast T1 C. N0 M0 stage IA ER 100%, PR 95%, HER-2 negative ratio 1.9, Ki-67 15% with additional satellite nodule 0.6 cm: Oncotype DX recurrence score is 13 (9% risk of recurrence)  Antiestrogen  therapy: Patient took tamoxifen from 05/26/2014 to 06/07/2014 and stopped it because of itching. Patient had undergone oophorectomy with TAH for reduction of risk of ovarian cancer. She never resumed anastrozole. She decided on her own to not take antiestrogen therapy at that time.  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 Adjuvant radiation started 11/24/2015- completed 01/14/2016 2. Herceptin every 3 weeks started 11/08/2015 until June 2017, resumed 04/07/2016 3.  Anastrozole restarted 03/13/2016  Herceptin toxicities: 1. First infusion reaction with shaking chills and rigors 2. Decline in the heart ejection fraction: Herceptin  was been on hold for 6 weeks, resumed 04/07/2016  Anastrozole toxicities:  Previously she had myalgias or hot flashes.  Return to clinic every 6 weeks for follow-up on Herceptin every 3 weeks for infusions.   No orders of the defined types were placed in this encounter.  The patient has a good understanding of the overall plan. she agrees with it. she will  call with any problems that may develop before the next visit here.   Rulon Eisenmenger, MD 07/03/16

## 2016-07-03 NOTE — Assessment & Plan Note (Deleted)
T1 C. N0 M0 stage IA ER 100%, PR 95%, HER-2 negative ratio 1.9, Ki-67 15% with additional satellite nodule 0.6 cm: Oncotype DX recurrence score is 13 (9% risk of recurrence)  Antiestrogen therapy: Patient took tamoxifen from 05/26/2014 to 06/07/2014 and stopped it because of itching. Patient had undergone oophorectomy with TAH for reduction of risk of ovarian cancer. She never resumed anastrozole. She decided on her own to not take antiestrogen therapy at that time.  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 Adjuvant radiation started 11/24/2015- completed 01/14/2016 2. Herceptin every 3 weeks started 11/08/2015 until June 2017, resumed 04/07/2016 3.  Anastrozole restarted 03/13/2016  Herceptin toxicities: 1. First infusion reaction with shaking chills and rigors 2. Decline in the heart ejection fraction: Herceptin was been on hold for 6 weeks, resumed 04/07/2016  Anastrozole toxicities:  Previously she had myalgias or hot flashes.  Return to clinic every 6 weeks for follow-up on Herceptin every 3 weeks for infusions.

## 2016-07-18 NOTE — Assessment & Plan Note (Signed)
T1 C. N0 M0 stage IA ER 100%, PR 95%, HER-2 negative ratio 1.9, Ki-67 15% with additional satellite nodule 0.6 cm: Oncotype DX recurrence score is 13 (9% risk of recurrence)  Antiestrogen therapy: Patient took tamoxifen from 05/26/2014 to 06/07/2014 and stopped it because of itching. Patient had undergone oophorectomy with TAH for reduction of risk of ovarian cancer. She never resumed anastrozole. She decided on her own to not take antiestrogen therapy at that time.  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 Adjuvant radiation started 11/24/2015- completed 01/14/2016 2. Herceptin every 3 weeks started 11/08/2015 with anastrozole. Because of decline in the heart ejection fraction, Herceptin will be on hold for the next 6 weeks. She will have another echocardiogram and if that is normal she can resume Herceptin.  Herceptin toxicities: 1. First infusion reaction with shaking chills and rigors 2. Decline in the heart ejection fraction: Herceptin has been on hold for 6 weeks.  ECHO 06/13/16: EF 55-60%  Current treatment: 1. Anastrozole restarted 03/13/2016 2. Herceptin resumed 07/19/16   Anastrozole toxicities:  Previously she had myalgias or hot flashes.  Return to clinic after Herceptin restarts. Every 3 weeks for herceptin and every 6 weeks for folow up.

## 2016-07-19 ENCOUNTER — Ambulatory Visit (HOSPITAL_BASED_OUTPATIENT_CLINIC_OR_DEPARTMENT_OTHER): Payer: Commercial Managed Care - PPO | Admitting: Hematology and Oncology

## 2016-07-19 ENCOUNTER — Other Ambulatory Visit (HOSPITAL_BASED_OUTPATIENT_CLINIC_OR_DEPARTMENT_OTHER): Payer: Commercial Managed Care - PPO

## 2016-07-19 ENCOUNTER — Encounter: Payer: Self-pay | Admitting: Hematology and Oncology

## 2016-07-19 ENCOUNTER — Ambulatory Visit (HOSPITAL_BASED_OUTPATIENT_CLINIC_OR_DEPARTMENT_OTHER): Payer: Commercial Managed Care - PPO

## 2016-07-19 VITALS — BP 138/76 | HR 91 | Temp 97.9°F | Resp 18 | Wt 260.0 lb

## 2016-07-19 DIAGNOSIS — R5382 Chronic fatigue, unspecified: Secondary | ICD-10-CM

## 2016-07-19 DIAGNOSIS — Z5112 Encounter for antineoplastic immunotherapy: Secondary | ICD-10-CM

## 2016-07-19 DIAGNOSIS — R5383 Other fatigue: Secondary | ICD-10-CM | POA: Diagnosis not present

## 2016-07-19 DIAGNOSIS — C50112 Malignant neoplasm of central portion of left female breast: Secondary | ICD-10-CM

## 2016-07-19 DIAGNOSIS — Z170 Estrogen receptor positive status [ER+]: Secondary | ICD-10-CM

## 2016-07-19 LAB — CBC WITH DIFFERENTIAL/PLATELET
BASO%: 0.8 % (ref 0.0–2.0)
Basophils Absolute: 0 10*3/uL (ref 0.0–0.1)
EOS%: 4.7 % (ref 0.0–7.0)
Eosinophils Absolute: 0.3 10*3/uL (ref 0.0–0.5)
HCT: 42.5 % (ref 34.8–46.6)
HGB: 13.9 g/dL (ref 11.6–15.9)
LYMPH%: 25.3 % (ref 14.0–49.7)
MCH: 29.9 pg (ref 25.1–34.0)
MCHC: 32.8 g/dL (ref 31.5–36.0)
MCV: 91.1 fL (ref 79.5–101.0)
MONO#: 0.3 10*3/uL (ref 0.1–0.9)
MONO%: 5.8 % (ref 0.0–14.0)
NEUT#: 3.6 10*3/uL (ref 1.5–6.5)
NEUT%: 63.4 % (ref 38.4–76.8)
Platelets: 249 10*3/uL (ref 145–400)
RBC: 4.66 10*6/uL (ref 3.70–5.45)
RDW: 13.6 % (ref 11.2–14.5)
WBC: 5.6 10*3/uL (ref 3.9–10.3)
lymph#: 1.4 10*3/uL (ref 0.9–3.3)

## 2016-07-19 LAB — COMPREHENSIVE METABOLIC PANEL
ALT: 34 U/L (ref 0–55)
AST: 21 U/L (ref 5–34)
Albumin: 3.6 g/dL (ref 3.5–5.0)
Alkaline Phosphatase: 94 U/L (ref 40–150)
Anion Gap: 10 mEq/L (ref 3–11)
BUN: 8.2 mg/dL (ref 7.0–26.0)
CO2: 27 mEq/L (ref 22–29)
Calcium: 9.1 mg/dL (ref 8.4–10.4)
Chloride: 106 mEq/L (ref 98–109)
Creatinine: 0.8 mg/dL (ref 0.6–1.1)
EGFR: >90 mL/min/{1.73_m2} (ref >90)
Glucose: 156 mg/dl — ABNORMAL HIGH (ref 70–140)
Potassium: 4 mEq/L (ref 3.5–5.1)
Sodium: 142 mEq/L (ref 136–145)
Total Bilirubin: 0.29 mg/dL (ref 0.20–1.20)
Total Protein: 7.5 g/dL (ref 6.4–8.3)

## 2016-07-19 MED ORDER — ACETAMINOPHEN 325 MG PO TABS
650.0000 mg | ORAL_TABLET | Freq: Once | ORAL | Status: AC
  Administered 2016-07-19: 650 mg via ORAL

## 2016-07-19 MED ORDER — DIPHENHYDRAMINE HCL 25 MG PO CAPS
50.0000 mg | ORAL_CAPSULE | Freq: Once | ORAL | Status: AC
  Administered 2016-07-19: 50 mg via ORAL

## 2016-07-19 MED ORDER — HEPARIN SOD (PORK) LOCK FLUSH 100 UNIT/ML IV SOLN
500.0000 [IU] | Freq: Once | INTRAVENOUS | Status: AC | PRN
Start: 2016-07-19 — End: 2016-07-19
  Administered 2016-07-19: 500 [IU]
  Filled 2016-07-19: qty 5

## 2016-07-19 MED ORDER — DIPHENHYDRAMINE HCL 25 MG PO CAPS
ORAL_CAPSULE | ORAL | Status: AC
  Filled 2016-07-19: qty 2

## 2016-07-19 MED ORDER — ACETAMINOPHEN 325 MG PO TABS
ORAL_TABLET | ORAL | Status: AC
  Filled 2016-07-19: qty 2

## 2016-07-19 MED ORDER — TRASTUZUMAB CHEMO 150 MG IV SOLR
8.0000 mg/kg | Freq: Once | INTRAVENOUS | Status: AC
  Administered 2016-07-19: 924 mg via INTRAVENOUS
  Filled 2016-07-19: qty 44

## 2016-07-19 MED ORDER — SODIUM CHLORIDE 0.9 % IV SOLN
Freq: Once | INTRAVENOUS | Status: AC
  Administered 2016-07-19: 15:00:00 via INTRAVENOUS

## 2016-07-19 MED ORDER — SODIUM CHLORIDE 0.9% FLUSH
10.0000 mL | INTRAVENOUS | Status: DC | PRN
  Administered 2016-07-19: 10 mL
  Filled 2016-07-19: qty 10

## 2016-07-19 NOTE — Patient Instructions (Signed)
[facility] Discharge Instructions for Patients Receiving Chemotherapy  Today you received the following chemotherapy agent: Herceptin   To help prevent nausea and vomiting after your treatment, we encourage you to take your nausea medication as prescribed.    If you develop nausea and vomiting that is not controlled by your nausea medication, call the clinic.   BELOW ARE SYMPTOMS THAT SHOULD BE REPORTED IMMEDIATELY:  *FEVER GREATER THAN 100.5 F  *CHILLS WITH OR WITHOUT FEVER  NAUSEA AND VOMITING THAT IS NOT CONTROLLED WITH YOUR NAUSEA MEDICATION  *UNUSUAL SHORTNESS OF BREATH  *UNUSUAL BRUISING OR BLEEDING  TENDERNESS IN MOUTH AND THROAT WITH OR WITHOUT PRESENCE OF ULCERS  *URINARY PROBLEMS  *BOWEL PROBLEMS  UNUSUAL RASH Items with * indicate a potential emergency and should be followed up as soon as possible.  Feel free to call the clinic you have any questions or concerns. The clinic phone number is [phone].  Please show the CHEMO ALERT CARD at check-in to the Emergency Department and triage nurse.   

## 2016-07-19 NOTE — Progress Notes (Signed)
Patient Care Team: [name] as PCP - General (General Practice)  DIAGNOSIS:  Encounter Diagnosis  Name Primary?  . Malignant neoplasm of central portion of left breast in female, estrogen receptor positive ([place])     SUMMARY OF ONCOLOGIC HISTORY:   Cancer of central portion of left female breast ([place])   04/16/2014 Surgery    Bilateral mastectomies: Left breast : Multifocal invasive ductal carcinoma positive for lymphovascular invasion, 1.2 cm and 0.6 cm, grade 3, high-grade DCIS with comedonecrosis, 4 SLN negative T1 C. N0 M0 stage IA: Oncotype DX 13 (ROR 9%)      04/16/2014 Surgery    Left upper back melanoma resected no residual melanoma identified on re-resection margins negative      05/26/2014 - 06/08/2015 Anti-estrogen oral therapy    Tamoxifen 20 mg daily with a plan to switch her to aromatase inhibitors once she gets oophorectomy ( patient did not continue antiestrogen therapy by choice)      08/13/2014 Surgery    oophorectomy with total abdominal hysterectomy      09/08/2015 -  Anti-estrogen oral therapy    Resumed anastrozole after she found recurrence of breast cancer      09/16/2015 Surgery    Skin, left: IDC grade 3; 0.5 cm, margins negative, ER 95%, PR 10%, HER-2 positive ratio 2.08      11/08/2015 -  Chemotherapy    Herceptin every 3 weeks plus anastrozole      11/24/2015 - 01/14/2016 Radiation Therapy    Adjuvant radiation therapy by Dr. Sondra Come       CHIEF COMPLIANT: Resuming Herceptin treatments  INTERVAL HISTORY: Krystal [name] is a 50 year old with above-mentioned history of left breast cancer treated with bilateral mastectomies she also had oophorectomy and total abdominal hysterectomy. She was on Herceptin maintenance since April 2017. We had to stop treatment in October because of decline in ejection fraction. Most recent echocardiogram showed an ejection fraction 65%. She was sent back was to resume Herceptin treatments. Her major complaint today is  related to fatigue. She tells me that this fatigue is constant. After the end of the day at work she is very tired and does not have any time for exercise. Is also complaining of heartburn. She has gained weight as well.  REVIEW OF SYSTEMS:   Constitutional: Denies fevers, chills or abnormal weight loss Eyes: Denies blurriness of vision Ears, nose, mouth, throat, and face: Denies mucositis or sore throat Respiratory: Denies cough, dyspnea or wheezes Cardiovascular: Denies palpitation, chest discomfort Gastrointestinal:  Denies nausea, heartburn or change in bowel habits Skin: Denies abnormal skin rashes Lymphatics: Denies new lymphadenopathy or easy bruising Neurological:Denies numbness, tingling or new weaknesses Behavioral/Psych: Mood is stable, no new changes  Extremities: No lower extremity edema Breast: Bilateral mastectomies breasts All other systems were reviewed with the patient and are negative.  I have reviewed the past medical history, past surgical history, social history and family history with the patient and they are unchanged from previous note.  ALLERGIES:  is allergic to dilaudid [hydromorphone] and codeine.  MEDICATIONS:  Current Outpatient Prescriptions  Medication Sig Dispense Refill  . acetaminophen (TYLENOL) 500 MG tablet Take 500 mg by mouth every 6 (six) hours as needed. Reported on 02/03/2016    . Alum Hydroxide-Mag Carbonate (GAVISCON PO) Take 2 tablets by mouth daily as needed (for heartburn). Reported on 02/03/2016    . anastrozole (ARIMIDEX) 1 MG tablet Take 1 tablet (1 mg total) by mouth daily. 90 tablet 3  .  carvedilol (COREG) 3.125 MG tablet Take 1 tablet (3.125 mg total) by mouth 2 (two) times daily. 60 tablet 3  . Ibuprofen-Diphenhydramine Cit (ADVIL PM PO) Take by mouth. Reported on 01/05/2016    . lidocaine-prilocaine (EMLA) cream Apply 1 application topically as needed. 30 g 1  . losartan (COZAAR) 25 MG tablet Take 1 tablet (25 mg total) by mouth  daily. 30 tablet 6  . non-metallic deodorant (ALRA) MISC Apply 1 application topically daily as needed.    . ondansetron (ZOFRAN-ODT) 4 MG disintegrating tablet Take 1 tablet (4 mg total) by mouth every 6 (six) hours as needed. Reported on 12/13/2015 20 tablet 5  . permethrin (ELIMITE) 5 % cream Reported on 01/05/2016     No current facility-administered medications for this visit.     PHYSICAL EXAMINATION: ECOG PERFORMANCE STATUS: 1 - Symptomatic but completely ambulatory  Vitals:   07/19/16 1338  BP: 138/76  Pulse: 91  Resp: 18  Temp: 97.9 F (36.6 C)   Filed Weights   07/19/16 1338  Weight: 260 lb (117.9 kg)    GENERAL:alert, no distress and comfortable SKIN: skin color, texture, turgor are normal, no rashes or significant lesions EYES: normal, Conjunctiva are pink and non-injected, sclera clear OROPHARYNX:no exudate, no erythema and lips, buccal mucosa, and tongue normal  NECK: supple, thyroid normal size, non-tender, without nodularity LYMPH:  no palpable lymphadenopathy in the cervical, axillary or inguinal LUNGS: clear to auscultation and percussion with normal breathing effort HEART: regular rate & rhythm and no murmurs and no lower extremity edema ABDOMEN:abdomen soft, non-tender and normal bowel sounds MUSCULOSKELETAL:no cyanosis of digits and no clubbing  NEURO: alert & oriented x 3 with fluent speech, no focal motor/sensory deficits EXTREMITIES: No lower extremity edema LABORATORY DATA:  I have reviewed the data as listed   Chemistry      Component Value Date/Time   NA 142 05/19/2016 1436   K 4.1 05/19/2016 1436   CL 102 01/03/2016 0855   CO2 28 05/19/2016 1436   BUN 10.8 05/19/2016 1436   CREATININE 0.8 05/19/2016 1436      Component Value Date/Time   CALCIUM 9.2 05/19/2016 1436   ALKPHOS 95 05/19/2016 1436   AST 18 05/19/2016 1436   ALT 32 05/19/2016 1436   BILITOT 0.38 05/19/2016 1436       Lab Results  Component Value Date   WBC 5.6 07/19/2016     HGB 13.9 07/19/2016   HCT 42.5 07/19/2016   MCV 91.1 07/19/2016   PLT 249 07/19/2016   NEUTROABS 3.6 07/19/2016    ASSESSMENT & PLAN:  Cancer of central portion of left female breast T1 C. N0 M0 stage IA ER 100%, PR 95%, HER-2 negative ratio 1.9, Ki-67 15% with additional satellite nodule 0.6 cm: Oncotype DX recurrence score is 13 (9% risk of recurrence)  Antiestrogen therapy: Patient took tamoxifen from 05/26/2014 to 06/07/2014 and stopped it because of itching. Patient had undergone oophorectomy with TAH for reduction of risk of ovarian cancer. She never resumed anastrozole. She decided on her own to not take antiestrogen therapy at that time.  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 Adjuvant radiation started 11/24/2015- completed 01/14/2016 2. Herceptin every 3 weeks started 11/08/2015 with anastrozole. Because of decline in the heart ejection fraction, Herceptin will be on hold for the next 6 weeks. She will have another echocardiogram and  if that is normal she can resume Herceptin.  Herceptin toxicities: 1. First infusion reaction with shaking chills and rigors 2. Decline in the heart ejection fraction: Herceptin has been on hold for 6 weeks.  ECHO 06/13/16: EF 55-60%  Current treatment: 1. Anastrozole restarted 03/13/2016 2. Herceptin resumed 07/19/16   Anastrozole toxicities: Previously she had myalgias and hot flashes. These symptoms have improved with time.  Severe fatigue: I would like to add TSH to her blood drawn today. Return to clinic every 3 weeks for herceptin and every 6 weeks for folow up. Labs can be done every 12 weeks.  No orders of the defined types were placed in this encounter.  The patient has a good understanding of the overall plan. she agrees with it. she will call with any problems that may develop before  the next visit here.   Rulon Eisenmenger, MD 07/19/16

## 2016-07-20 LAB — T3 UPTAKE
Free Thyroxine Index: 1.4 (ref 1.2–4.9)
T3 Uptake Ratio: 21 % — ABNORMAL LOW (ref 24–39)

## 2016-07-20 LAB — T4, FREE: T4,Free(Direct): 1.01 ng/dL (ref 0.82–1.77)

## 2016-07-20 LAB — T4: Thyroxine (T4): 6.8 ug/dL (ref 4.5–12.0)

## 2016-07-20 LAB — TSH: TSH: 1.056 m(IU)/L (ref 0.308–3.960)

## 2016-08-09 ENCOUNTER — Ambulatory Visit (HOSPITAL_BASED_OUTPATIENT_CLINIC_OR_DEPARTMENT_OTHER): Payer: Commercial Managed Care - PPO

## 2016-08-09 VITALS — BP 135/80 | HR 74 | Temp 97.7°F | Resp 18

## 2016-08-09 DIAGNOSIS — C50112 Malignant neoplasm of central portion of left female breast: Secondary | ICD-10-CM

## 2016-08-09 DIAGNOSIS — Z5112 Encounter for antineoplastic immunotherapy: Secondary | ICD-10-CM

## 2016-08-09 MED ORDER — DIPHENHYDRAMINE HCL 25 MG PO CAPS
50.0000 mg | ORAL_CAPSULE | Freq: Once | ORAL | Status: AC
  Administered 2016-08-09: 50 mg via ORAL

## 2016-08-09 MED ORDER — DIPHENHYDRAMINE HCL 25 MG PO CAPS
ORAL_CAPSULE | ORAL | Status: AC
  Filled 2016-08-09: qty 2

## 2016-08-09 MED ORDER — HEPARIN SOD (PORK) LOCK FLUSH 100 UNIT/ML IV SOLN
500.0000 [IU] | Freq: Once | INTRAVENOUS | Status: AC | PRN
  Administered 2016-08-09: 500 [IU]
  Filled 2016-08-09: qty 5

## 2016-08-09 MED ORDER — ACETAMINOPHEN 325 MG PO TABS
650.0000 mg | ORAL_TABLET | Freq: Once | ORAL | Status: AC
  Administered 2016-08-09: 650 mg via ORAL

## 2016-08-09 MED ORDER — SODIUM CHLORIDE 0.9% FLUSH
10.0000 mL | INTRAVENOUS | Status: DC | PRN
  Administered 2016-08-09: 10 mL
  Filled 2016-08-09: qty 10

## 2016-08-09 MED ORDER — ACETAMINOPHEN 325 MG PO TABS
ORAL_TABLET | ORAL | Status: AC
Start: 2016-08-09 — End: 2016-08-09
  Filled 2016-08-09: qty 2

## 2016-08-09 MED ORDER — TRASTUZUMAB CHEMO 150 MG IV SOLR
6.0000 mg/kg | Freq: Once | INTRAVENOUS | Status: AC
  Administered 2016-08-09: 693 mg via INTRAVENOUS
  Filled 2016-08-09: qty 33

## 2016-08-09 MED ORDER — SODIUM CHLORIDE 0.9 % IV SOLN
Freq: Once | INTRAVENOUS | Status: AC
  Administered 2016-08-09: 15:00:00 via INTRAVENOUS

## 2016-08-09 NOTE — Patient Instructions (Signed)
[facility] Discharge Instructions for Patients Receiving Chemotherapy  Today you received the following chemotherapy agent: Herceptin   To help prevent nausea and vomiting after your treatment, we encourage you to take your nausea medication as prescribed.    If you develop nausea and vomiting that is not controlled by your nausea medication, call the clinic.   BELOW ARE SYMPTOMS THAT SHOULD BE REPORTED IMMEDIATELY:  *FEVER GREATER THAN 100.5 F  *CHILLS WITH OR WITHOUT FEVER  NAUSEA AND VOMITING THAT IS NOT CONTROLLED WITH YOUR NAUSEA MEDICATION  *UNUSUAL SHORTNESS OF BREATH  *UNUSUAL BRUISING OR BLEEDING  TENDERNESS IN MOUTH AND THROAT WITH OR WITHOUT PRESENCE OF ULCERS  *URINARY PROBLEMS  *BOWEL PROBLEMS  UNUSUAL RASH Items with * indicate a potential emergency and should be followed up as soon as possible.  Feel free to call the clinic you have any questions or concerns. The clinic phone number is [phone].  Please show the CHEMO ALERT CARD at check-in to the Emergency Department and triage nurse.   

## 2016-08-25 ENCOUNTER — Encounter (HOSPITAL_COMMUNITY): Payer: Commercial Managed Care - PPO | Admitting: Internal Medicine

## 2016-08-25 ENCOUNTER — Other Ambulatory Visit (HOSPITAL_COMMUNITY): Payer: Commercial Managed Care - PPO

## 2016-08-29 NOTE — Assessment & Plan Note (Signed)
T1 C. N0 M0 stage IA ER 100%, PR 95%, HER-2 negative ratio 1.9, Ki-67 15% with additional satellite nodule 0.6 cm: Oncotype DX recurrence score is 13 (9% risk of recurrence)  Antiestrogen therapy: Patient took tamoxifen from 05/26/2014 to 06/07/2014 and stopped it because of itching. Patient had undergone oophorectomy with TAH for reduction of risk of ovarian cancer. She never resumed anastrozole. She decided on her own to not take antiestrogen therapy at that time.  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 Adjuvant radiation started 11/24/2015- completed 01/14/2016 2. Herceptin every 3 weeks started 11/08/2015 with anastrozole. Because of decline in the heart ejection fraction, Herceptin will be on hold for the next 6 weeks. She will have another echocardiogram and if that is normal she can resume Herceptin.  Herceptin toxicities: 1. First infusion reaction with shaking chills and rigors 2. Decline in the heart ejection fraction: Herceptin has been on hold for 6 weeks. She will have another echocardiogram and if that is normal she can resume Herceptin.  Current treatment: 1. Anastrozole restarted 03/13/2016 2. Herceptin on hold until echocardiogram   Anastrozole toxicities:  Previously she had myalgias or hot flashes.  Return to clinic after Herceptin restarts.  RTC in 3 weeks for herceptin and every 6 weeks for follow up with me

## 2016-08-30 ENCOUNTER — Ambulatory Visit (HOSPITAL_BASED_OUTPATIENT_CLINIC_OR_DEPARTMENT_OTHER): Payer: Commercial Managed Care - PPO

## 2016-08-30 ENCOUNTER — Ambulatory Visit (HOSPITAL_BASED_OUTPATIENT_CLINIC_OR_DEPARTMENT_OTHER): Payer: Commercial Managed Care - PPO | Admitting: Hematology and Oncology

## 2016-08-30 ENCOUNTER — Encounter: Payer: Self-pay | Admitting: Hematology and Oncology

## 2016-08-30 DIAGNOSIS — C50112 Malignant neoplasm of central portion of left female breast: Secondary | ICD-10-CM

## 2016-08-30 DIAGNOSIS — Z5112 Encounter for antineoplastic immunotherapy: Secondary | ICD-10-CM

## 2016-08-30 DIAGNOSIS — Z170 Estrogen receptor positive status [ER+]: Secondary | ICD-10-CM | POA: Diagnosis not present

## 2016-08-30 MED ORDER — SODIUM CHLORIDE 0.9% FLUSH
10.0000 mL | INTRAVENOUS | Status: DC | PRN
  Administered 2016-08-30: 10 mL
  Filled 2016-08-30: qty 10

## 2016-08-30 MED ORDER — SODIUM CHLORIDE 0.9 % IV SOLN
6.0000 mg/kg | Freq: Once | INTRAVENOUS | Status: AC
  Administered 2016-08-30: 693 mg via INTRAVENOUS
  Filled 2016-08-30: qty 33

## 2016-08-30 MED ORDER — ACETAMINOPHEN 325 MG PO TABS
ORAL_TABLET | ORAL | Status: AC
  Filled 2016-08-30: qty 2

## 2016-08-30 MED ORDER — DIPHENHYDRAMINE HCL 25 MG PO CAPS
50.0000 mg | ORAL_CAPSULE | Freq: Once | ORAL | Status: AC
  Administered 2016-08-30: 50 mg via ORAL

## 2016-08-30 MED ORDER — ACETAMINOPHEN 325 MG PO TABS
650.0000 mg | ORAL_TABLET | Freq: Once | ORAL | Status: AC
  Administered 2016-08-30: 650 mg via ORAL

## 2016-08-30 MED ORDER — SODIUM CHLORIDE 0.9 % IV SOLN
Freq: Once | INTRAVENOUS | Status: AC
  Administered 2016-08-30: 16:00:00 via INTRAVENOUS

## 2016-08-30 MED ORDER — HEPARIN SOD (PORK) LOCK FLUSH 100 UNIT/ML IV SOLN
500.0000 [IU] | Freq: Once | INTRAVENOUS | Status: AC | PRN
  Administered 2016-08-30: 500 [IU]
  Filled 2016-08-30: qty 5

## 2016-08-30 MED ORDER — DIPHENHYDRAMINE HCL 25 MG PO CAPS
ORAL_CAPSULE | ORAL | Status: AC
  Filled 2016-08-30: qty 2

## 2016-08-30 NOTE — Progress Notes (Signed)
Patient Care Team: [name] as PCP - General (General Practice)  DIAGNOSIS:  Encounter Diagnosis  Name Primary?  . Malignant neoplasm of central portion of left breast in female, estrogen receptor positive ([place])     SUMMARY OF ONCOLOGIC HISTORY:   Cancer of central portion of left female breast ([place])   04/16/2014 Surgery    Bilateral mastectomies: Left breast : Multifocal invasive ductal carcinoma positive for lymphovascular invasion, 1.2 cm and 0.6 cm, grade 3, high-grade DCIS with comedonecrosis, 4 SLN negative T1 C. N0 M0 stage IA: Oncotype DX 13 (ROR 9%)      04/16/2014 Surgery    Left upper back melanoma resected no residual melanoma identified on re-resection margins negative      05/26/2014 - 06/08/2015 Anti-estrogen oral therapy    Tamoxifen 20 mg daily with a plan to switch her to aromatase inhibitors once she gets oophorectomy ( patient did not continue antiestrogen therapy by choice)      08/13/2014 Surgery    oophorectomy with total abdominal hysterectomy      09/08/2015 -  Anti-estrogen oral therapy    Resumed anastrozole after she found recurrence of breast cancer      09/16/2015 Surgery    Skin, left: IDC grade 3; 0.5 cm, margins negative, ER 95%, PR 10%, HER-2 positive ratio 2.08      11/08/2015 -  Chemotherapy    Herceptin every 3 weeks plus anastrozole      11/24/2015 - 01/14/2016 Radiation Therapy    Adjuvant radiation therapy by Dr. Sondra Come       CHIEF COMPLIANT: Follow-up of Herceptin  INTERVAL HISTORY: Krystal [name] is a 51 year old with above-mentioned history of left breast cancer currently on adjuvant Herceptin. She is tolerating Herceptin very well now. She follows with cardiology as well. She is also taking anastrozole.  REVIEW OF SYSTEMS:   Constitutional: Denies fevers, chills or abnormal weight loss Eyes: Denies blurriness of vision Ears, nose, mouth, throat, and face: Denies mucositis or sore throat Respiratory: Denies cough, dyspnea  or wheezes Cardiovascular: Denies palpitation, chest discomfort Gastrointestinal:  Denies nausea, heartburn or change in bowel habits Skin: Denies abnormal skin rashes Lymphatics: Denies new lymphadenopathy or easy bruising Neurological:Denies numbness, tingling or new weaknesses Behavioral/Psych: Mood is stable, no new changes  Extremities: No lower extremity edema Breast:  denies any pain or lumps or nodules in either breasts All other systems were reviewed with the patient and are negative.  I have reviewed the past medical history, past surgical history, social history and family history with the patient and they are unchanged from previous note.  ALLERGIES:  is allergic to dilaudid [hydromorphone] and codeine.  MEDICATIONS:  Current Outpatient Prescriptions  Medication Sig Dispense Refill  . acetaminophen (TYLENOL) 500 MG tablet Take 500 mg by mouth every 6 (six) hours as needed. Reported on 02/03/2016    . Alum Hydroxide-Mag Carbonate (GAVISCON PO) Take 2 tablets by mouth daily as needed (for heartburn). Reported on 02/03/2016    . anastrozole (ARIMIDEX) 1 MG tablet Take 1 tablet (1 mg total) by mouth daily. 90 tablet 3  . carvedilol (COREG) 3.125 MG tablet Take 1 tablet (3.125 mg total) by mouth 2 (two) times daily. 60 tablet 3  . Ibuprofen-Diphenhydramine Cit (ADVIL PM PO) Take by mouth. Reported on 01/05/2016    . lidocaine-prilocaine (EMLA) cream Apply 1 application topically as needed. 30 g 1  . losartan (COZAAR) 25 MG tablet Take 1 tablet (25 mg total) by mouth daily. [place]  tablet 6  . ondansetron (ZOFRAN-ODT) 4 MG disintegrating tablet Take 1 tablet (4 mg total) by mouth every 6 (six) hours as needed. Reported on 12/13/2015 20 tablet 5  . permethrin (ELIMITE) 5 % cream Reported on 01/05/2016     No current facility-administered medications for this visit.    Facility-Administered Medications Ordered in Other Visits  Medication Dose Route Frequency Provider Last Rate Last Dose  .  heparin lock flush 100 unit/mL  500 Units Intracatheter Once PRN [name], MD      . sodium chloride flush (NS) 0.9 % injection 10 mL  10 mL Intracatheter PRN [name], MD      . trastuzumab (HERCEPTIN) 693 mg in sodium chloride 0.9 % 250 mL chemo infusion  6 mg/kg (Treatment Plan Recorded) Intravenous Once [name], MD        PHYSICAL EXAMINATION: ECOG PERFORMANCE STATUS: 0 - Asymptomatic  Vitals:   08/30/16 1524  BP: 129/79  Pulse: 96  Resp: 18  Temp: 98.2 F (36.8 C)   Filed Weights   08/30/16 1524  Weight: 259 lb 14.4 oz (117.9 kg)    GENERAL:alert, no distress and comfortable SKIN: skin color, texture, turgor are normal, no rashes or significant lesions EYES: normal, Conjunctiva are pink and non-injected, sclera clear OROPHARYNX:no exudate, no erythema and lips, buccal mucosa, and tongue normal  NECK: supple, thyroid normal size, non-tender, without nodularity LYMPH:  no palpable lymphadenopathy in the cervical, axillary or inguinal LUNGS: clear to auscultation and percussion with normal breathing effort HEART: regular rate & rhythm and no murmurs and no lower extremity edema ABDOMEN:abdomen soft, non-tender and normal bowel sounds MUSCULOSKELETAL:no cyanosis of digits and no clubbing  NEURO: alert & oriented x 3 with fluent speech, no focal motor/sensory deficits EXTREMITIES: No lower extremity edema   LABORATORY DATA:  I have reviewed the data as listed   Chemistry      Component Value Date/Time   NA 142 07/19/2016 1320   K 4.0 07/19/2016 1320   CL 102 01/03/2016 0855   CO2 27 07/19/2016 1320   BUN 8.2 07/19/2016 1320   CREATININE 0.8 07/19/2016 1320      Component Value Date/Time   CALCIUM 9.1 07/19/2016 1320   ALKPHOS 94 07/19/2016 1320   AST 21 07/19/2016 1320   ALT 34 07/19/2016 1320   BILITOT 0.29 07/19/2016 1320       Lab Results  Component Value Date   WBC 5.6 07/19/2016   HGB 13.9 07/19/2016   HCT 42.5 07/19/2016   MCV 91.1  07/19/2016   PLT 249 07/19/2016   NEUTROABS 3.6 07/19/2016    ASSESSMENT & PLAN:  Cancer of central portion of left female breast T1 C. N0 M0 stage IA ER 100%, PR 95%, HER-2 negative ratio 1.9, Ki-67 15% with additional satellite nodule 0.6 cm: Oncotype DX recurrence score is 13 (9% risk of recurrence)  Antiestrogen therapy: Patient took tamoxifen from 05/26/2014 to 06/07/2014 and stopped it because of itching. Patient had undergone oophorectomy with TAH for reduction of risk of ovarian cancer. She never resumed anastrozole. She decided on her own to not take antiestrogen therapy at that time.  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 Adjuvant radiation started 11/24/2015- completed 01/14/2016 2. Herceptin every 3 weeks started 11/08/2015 with anastrozole. Because of decline in the heart ejection fraction, Herceptin was on hold and then  resumed.  Herceptin toxicities: 1. First infusion reaction with shaking chills and rigors 2. Decline in the heart ejection fraction: resumed Herceptin. Follows with cardiology closely  Current treatment: 1. Anastrozole restarted 03/13/2016 2. Herceptin to be completed July 2018  Anastrozole toxicities:  Previously she had myalgias or hot flashes.  RTC in 3 weeks for herceptin and every 6 weeks for follow up with me  I spent 15 minutes talking to the patient of which more than half was spent in counseling and coordination of care.  No orders of the defined types were placed in this encounter.  The patient has a good understanding of the overall plan. she agrees with it. she will call with any problems that may develop before the next visit here.   Rulon Eisenmenger, MD 08/30/16

## 2016-08-30 NOTE — Patient Instructions (Signed)
[facility] Discharge Instructions for Patients Receiving Chemotherapy  Today you received the following chemotherapy agents:  Herceptin  To help prevent nausea and vomiting after your treatment, we encourage you to take your nausea medication as prescribed.   If you develop nausea and vomiting that is not controlled by your nausea medication, call the clinic.   BELOW ARE SYMPTOMS THAT SHOULD BE REPORTED IMMEDIATELY:  *FEVER GREATER THAN 100.5 F  *CHILLS WITH OR WITHOUT FEVER  NAUSEA AND VOMITING THAT IS NOT CONTROLLED WITH YOUR NAUSEA MEDICATION  *UNUSUAL SHORTNESS OF BREATH  *UNUSUAL BRUISING OR BLEEDING  TENDERNESS IN MOUTH AND THROAT WITH OR WITHOUT PRESENCE OF ULCERS  *URINARY PROBLEMS  *BOWEL PROBLEMS  UNUSUAL RASH Items with * indicate a potential emergency and should be followed up as soon as possible.  Feel free to call the clinic you have any questions or concerns. The clinic phone number is [phone].  Please show the CHEMO ALERT CARD at check-in to the Emergency Department and triage nurse.   

## 2016-09-11 ENCOUNTER — Other Ambulatory Visit (HOSPITAL_COMMUNITY): Payer: Self-pay | Admitting: *Deleted

## 2016-09-11 DIAGNOSIS — T451X5A Adverse effect of antineoplastic and immunosuppressive drugs, initial encounter: Secondary | ICD-10-CM

## 2016-09-11 DIAGNOSIS — I427 Cardiomyopathy due to drug and external agent: Secondary | ICD-10-CM

## 2016-09-12 ENCOUNTER — Ambulatory Visit (HOSPITAL_COMMUNITY)
Admission: RE | Admit: 2016-09-12 | Discharge: 2016-09-12 | Disposition: A | Discharge status: Home / Self Care | Payer: Commercial Managed Care - PPO | Source: Ambulatory Visit | Attending: Internal Medicine | Admitting: Internal Medicine

## 2016-09-12 ENCOUNTER — Ambulatory Visit (HOSPITAL_BASED_OUTPATIENT_CLINIC_OR_DEPARTMENT_OTHER)
Admission: RE | Admit: 2016-09-12 | Discharge: 2016-09-12 | Disposition: A | Discharge status: Home / Self Care | Payer: Commercial Managed Care - PPO | Source: Ambulatory Visit | Attending: Internal Medicine | Admitting: Internal Medicine

## 2016-09-12 ENCOUNTER — Encounter (HOSPITAL_COMMUNITY): Payer: Self-pay | Admitting: Internal Medicine

## 2016-09-12 VITALS — BP 138/86 | HR 86 | Wt 262.8 lb

## 2016-09-12 DIAGNOSIS — T451X5A Adverse effect of antineoplastic and immunosuppressive drugs, initial encounter: Secondary | ICD-10-CM

## 2016-09-12 DIAGNOSIS — Z79899 Other long term (current) drug therapy: Secondary | ICD-10-CM | POA: Diagnosis not present

## 2016-09-12 DIAGNOSIS — C50919 Malignant neoplasm of unspecified site of unspecified female breast: Secondary | ICD-10-CM | POA: Insufficient documentation

## 2016-09-12 DIAGNOSIS — Z885 Allergy status to narcotic agent status: Secondary | ICD-10-CM | POA: Insufficient documentation

## 2016-09-12 DIAGNOSIS — Z90722 Acquired absence of ovaries, bilateral: Secondary | ICD-10-CM | POA: Diagnosis not present

## 2016-09-12 DIAGNOSIS — Z85820 Personal history of malignant melanoma of skin: Secondary | ICD-10-CM | POA: Insufficient documentation

## 2016-09-12 DIAGNOSIS — Z87891 Personal history of nicotine dependence: Secondary | ICD-10-CM | POA: Insufficient documentation

## 2016-09-12 DIAGNOSIS — C50112 Malignant neoplasm of central portion of left female breast: Secondary | ICD-10-CM | POA: Diagnosis not present

## 2016-09-12 DIAGNOSIS — Z79811 Long term (current) use of aromatase inhibitors: Secondary | ICD-10-CM | POA: Insufficient documentation

## 2016-09-12 DIAGNOSIS — K219 Gastro-esophageal reflux disease without esophagitis: Secondary | ICD-10-CM | POA: Diagnosis not present

## 2016-09-12 DIAGNOSIS — M19019 Primary osteoarthritis, unspecified shoulder: Secondary | ICD-10-CM | POA: Diagnosis not present

## 2016-09-12 DIAGNOSIS — Z1501 Genetic susceptibility to malignant neoplasm of breast: Secondary | ICD-10-CM | POA: Diagnosis not present

## 2016-09-12 DIAGNOSIS — R5383 Other fatigue: Secondary | ICD-10-CM | POA: Diagnosis not present

## 2016-09-12 DIAGNOSIS — Z90710 Acquired absence of both cervix and uterus: Secondary | ICD-10-CM | POA: Diagnosis not present

## 2016-09-12 DIAGNOSIS — I427 Cardiomyopathy due to drug and external agent: Secondary | ICD-10-CM | POA: Diagnosis not present

## 2016-09-12 DIAGNOSIS — Z9013 Acquired absence of bilateral breasts and nipples: Secondary | ICD-10-CM | POA: Insufficient documentation

## 2016-09-12 NOTE — Progress Notes (Signed)
  Echocardiogram 2D Echocardiogram has been performed.  Jennette Dubin 09/12/2016, 3:11 PM

## 2016-09-12 NOTE — Addendum Note (Signed)
Encounter addended by: Kerry Dory, CMA on: 09/12/2016  4:01 PM<BR>    Actions taken: Order list changed, Diagnosis association updated, Sign clinical note

## 2016-09-12 NOTE — Patient Instructions (Signed)
Your physician has requested that you have an echocardiogram. Echocardiography is a painless test that uses sound waves to create images of your heart. It provides your doctor with information about the size and shape of your heart and how well your heart's chambers and valves are working. This procedure takes approximately one hour. There are no restrictions for this procedure.  Your physician recommends that you schedule a follow-up appointment in: 2 months with Dr Bensimhon   

## 2016-09-12 NOTE — Progress Notes (Signed)
Patient ID: Krystal [name], female   DOB: [birth year], 51 y.o.   MRN: [id number]     Cardio-oncology Clinic Progress Note  Referring Physician: Lindi Adie Primary Care: Primary Cardiologist:  HPI: INTERVAL HISTORY: Krystal [name] is a 51 year old with above-mentioned history of bilateral mastectomies for left-sided multifocal invasive ductal carcinoma that was noticed on Oncotype and we recommended hormonal therapy but she was noncompliant and did not take any antiestrogen therapy. She went a full year without any treatment and felt a lump in the breast while she was undergoing breast reconstruction and fat grafting. Lap C was then performed which came back as recurrent invasive ductal carcinoma. She underwent resection on 09/16/2015 by Dr. Donne Hazel and final pathology revealed that this was an ER/PR and HER-2 positive breast cancer. Because of this she was referred back was performed discussion. She tells me that she resume taking anastrozole about a month ago after he was diagnosed with a recurrence. She does complain of arthralgias but otherwise tolerating it fairly well.  Herceptin held in 6/17 for mild toxicity. Restarted at last visit  She presents today for close follow up.  Herceptin restarted in 8/17. On carvedilol and carvedilol. Feels well. Tolerating Herceptin well. Some fatigue. Denies SOB/orthopnea. Gets edema if she eats salt. 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     Cancer of central portion of left female breast ([place])   04/16/2014 Surgery Bilateral mastectomies: Left breast : Multifocal invasive ductal carcinoma positive for lymphovascular invasion, 1.2 cm and 0.6 cm, grade 3, high-grade DCIS with comedonecrosis, 4 SLN negative T1 C. N0 M0 stage IA: Oncotype DX 13 (ROR 9%)   04/16/2014 Surgery Left upper back melanoma resected no residual melanoma identified on re-resection margins negative   05/26/2014 - 08/03/2015 Anti-estrogen oral therapy Tamoxifen 20 mg daily with a plan  to switch her to aromatase inhibitors once she gets oophorectomy ( patient did not continue antiestrogen therapy by choice)   08/13/2014 Surgery oophorectomy with total abdominal hysterectomy   09/08/2015 -  Anti-estrogen oral therapy Resumed anastrozole after she found recurrence of breast cancer   09/16/2015 Surgery Skin, left: IDC grade 3; 0.5 cm, margins negative    11/08/2015 -  Chemotherapy Herceptin every 3 weeks plus anastrozole               3/17   Echo 60-65% Lat S' 10.8 GLS -20 02/03/16 Echo 45-50% Lat S' 9.8 GLS -17.1 03/24/16 Echo 55-60%, Grade 1, Lat S' 11.7, GLS -20.3 06/14/16 Echo 55/60% Grade 1 DD lat s' 11.0 GLS -17.8%  09/12/16  Echo 55-60%  Lat s' 11.4 GLS -15.6% (underestimated due to poor endocardial tracking)   Past Medical History:  Diagnosis Date  . Arthritis    shoulders  . BRCA1 positive    BRCA1 [id number]  . Chest wall mass 09/2015  . Complication of anesthesia    respiratory depression/arrest with Dilaudid  . Cough with sputum 09/10/2015   clear sputum, per pt.  . Dental crowns present   . GERD (gastroesophageal reflux disease)   . History of breast cancer 2015  . PONV (postoperative nausea and vomiting)   . Stuffy and runny nose 09/10/2015   green drainage from nose, per pt.    Current Outpatient Prescriptions  Medication Sig Dispense Refill  . acetaminophen (TYLENOL) 500 MG tablet Take 500 mg by mouth every 6 (six) hours as needed. Reported on 02/03/2016    . Alum Hydroxide-Mag Carbonate (GAVISCON PO) Take 2 tablets by mouth daily as needed (for heartburn). Reported  on 02/03/2016    . anastrozole (ARIMIDEX) 1 MG tablet Take 1 tablet (1 mg total) by mouth daily. 90 tablet 3  . carvedilol (COREG) 3.125 MG tablet Take 1 tablet (3.125 mg total) by mouth 2 (two) times daily. 60 tablet 3  . Ibuprofen-Diphenhydramine Cit (ADVIL PM PO) Take by mouth. Reported on 01/05/2016    . lidocaine-prilocaine (EMLA) cream Apply 1 application topically as  needed. 30 g 1  . losartan (COZAAR) 25 MG tablet Take 1 tablet (25 mg total) by mouth daily. 30 tablet 6  . ondansetron (ZOFRAN-ODT) 4 MG disintegrating tablet Take 1 tablet (4 mg total) by mouth every 6 (six) hours as needed. Reported on 12/13/2015 20 tablet 5  . permethrin (ELIMITE) 5 % cream Reported on 01/05/2016     No current facility-administered medications for this encounter.     Allergies  Allergen Reactions  . Dilaudid [Hydromorphone] Other (See Comments)    RESPIRATORY DEPRESSION  . Codeine Nausea And Vomiting      Social History   Social History  . Marital status: Widowed    Spouse name: N/A  . Number of children: 3  . Years of education: N/A   Occupational History  . Oncologist   Social History Main Topics  . Smoking status: Former Smoker    Packs/day: 0.00    Years: 0.00    Quit date: 04/17/2014  . Smokeless tobacco: Never Used  . Alcohol use Yes     Comment: occasionally  . Drug use: No  . Sexual activity: Not on file   Other Topics Concern  . Not on file   Social History Narrative  . No narrative on file      Family History  Problem Relation Age of Onset  . Breast cancer Paternal Aunt 11    deceased 70s  . Breast cancer Paternal Grandmother     dx 20s; ? if had 2nd BC in 73s; deceased 42s  . Pancreatic cancer Paternal Uncle 82    smoker; deceased    Vitals:   10-Oct-2016 1508  BP: 138/86  Pulse: 86  SpO2: 98%  Weight: 262 lb 12 oz (119.2 kg)    PHYSICAL EXAM: General:  Well appearing. No respiratory difficulty HEENT: normal Neck: supple. no JVD. Carotids 2+ bilat; no bruits. No thyromegaly or nodule noted.  Cor: PMI nondisplaced. RRR. No M/G/R Lungs: CTAB, normal effort Abdomen: obese soft, NT Extremities: no cyanosis, clubbing, rash, edema Neuro: alert & oriented x 3, cranial nerves grossly intact. moves all 4 extremities w/o difficulty. Affect pleasant.   ASSESSMENT & PLAN: 1. Breast  CA, recurrent HER-2-neu+   - Repeat Echo parameters discussed and reviewed. EF remains normal.   - Will continue Herceptin. Return for repeat echo in 2 months.   - Continue carvedilol 3.125 mg twice a day. Continue losartan 25 mg daily.    2. Former Smoker- quit April 2017  3. Herceptin-induced cardiomyopathy    - As above. Improved. Back on Herceptin .  4. Fatigue    --Suspect OSA. Refuses sleep study.   Pleshette Tomasini,MD 3:52 PM

## 2016-09-13 ENCOUNTER — Ambulatory Visit: Payer: Commercial Managed Care - PPO

## 2016-09-20 ENCOUNTER — Ambulatory Visit: Payer: Commercial Managed Care - PPO

## 2016-09-21 ENCOUNTER — Ambulatory Visit (HOSPITAL_COMMUNITY)
Admission: RE | Admit: 2016-09-21 | Discharge: 2016-09-21 | Disposition: A | Discharge status: Home / Self Care | Payer: Commercial Managed Care - PPO | Source: Ambulatory Visit | Attending: Oncology | Admitting: Oncology

## 2016-09-21 ENCOUNTER — Ambulatory Visit (HOSPITAL_BASED_OUTPATIENT_CLINIC_OR_DEPARTMENT_OTHER): Payer: Commercial Managed Care - PPO | Admitting: Oncology

## 2016-09-21 ENCOUNTER — Ambulatory Visit (HOSPITAL_BASED_OUTPATIENT_CLINIC_OR_DEPARTMENT_OTHER): Payer: Commercial Managed Care - PPO

## 2016-09-21 ENCOUNTER — Encounter: Payer: Self-pay | Admitting: Oncology

## 2016-09-21 VITALS — BP 125/79 | HR 85 | Temp 99.3°F | Resp 18

## 2016-09-21 DIAGNOSIS — C50112 Malignant neoplasm of central portion of left female breast: Secondary | ICD-10-CM

## 2016-09-21 DIAGNOSIS — R609 Edema, unspecified: Secondary | ICD-10-CM

## 2016-09-21 DIAGNOSIS — Z5112 Encounter for antineoplastic immunotherapy: Secondary | ICD-10-CM | POA: Diagnosis not present

## 2016-09-21 DIAGNOSIS — M25572 Pain in left ankle and joints of left foot: Secondary | ICD-10-CM | POA: Diagnosis not present

## 2016-09-21 DIAGNOSIS — M79605 Pain in left leg: Secondary | ICD-10-CM

## 2016-09-21 DIAGNOSIS — M7732 Calcaneal spur, left foot: Secondary | ICD-10-CM | POA: Insufficient documentation

## 2016-09-21 DIAGNOSIS — S8990XA Unspecified injury of unspecified lower leg, initial encounter: Secondary | ICD-10-CM | POA: Insufficient documentation

## 2016-09-21 DIAGNOSIS — S8992XA Unspecified injury of left lower leg, initial encounter: Secondary | ICD-10-CM

## 2016-09-21 DIAGNOSIS — W19XXXA Unspecified fall, initial encounter: Secondary | ICD-10-CM | POA: Diagnosis not present

## 2016-09-21 DIAGNOSIS — M19072 Primary osteoarthritis, left ankle and foot: Secondary | ICD-10-CM | POA: Insufficient documentation

## 2016-09-21 DIAGNOSIS — Z9181 History of falling: Secondary | ICD-10-CM

## 2016-09-21 MED ORDER — ACETAMINOPHEN 325 MG PO TABS
650.0000 mg | ORAL_TABLET | Freq: Once | ORAL | Status: AC
Start: 2016-09-21 — End: 2016-09-21
  Administered 2016-09-21: 650 mg via ORAL

## 2016-09-21 MED ORDER — TRASTUZUMAB CHEMO 150 MG IV SOLR
6.0000 mg/kg | Freq: Once | INTRAVENOUS | Status: AC
  Administered 2016-09-21: 693 mg via INTRAVENOUS
  Filled 2016-09-21: qty 33

## 2016-09-21 MED ORDER — DIPHENHYDRAMINE HCL 25 MG PO CAPS
50.0000 mg | ORAL_CAPSULE | Freq: Once | ORAL | Status: AC
  Administered 2016-09-21: 50 mg via ORAL

## 2016-09-21 MED ORDER — DIPHENHYDRAMINE HCL 25 MG PO CAPS
ORAL_CAPSULE | ORAL | Status: AC
  Filled 2016-09-21: qty 2

## 2016-09-21 MED ORDER — ACETAMINOPHEN 325 MG PO TABS
ORAL_TABLET | ORAL | Status: AC
  Filled 2016-09-21: qty 2

## 2016-09-21 MED ORDER — SODIUM CHLORIDE 0.9 % IV SOLN
Freq: Once | INTRAVENOUS | Status: AC
  Administered 2016-09-21: 16:00:00 via INTRAVENOUS

## 2016-09-21 MED ORDER — SODIUM CHLORIDE 0.9% FLUSH
10.0000 mL | INTRAVENOUS | Status: DC | PRN
  Administered 2016-09-21: 10 mL
  Filled 2016-09-21: qty 10

## 2016-09-21 MED ORDER — HEPARIN SOD (PORK) LOCK FLUSH 100 UNIT/ML IV SOLN
500.0000 [IU] | Freq: Once | INTRAVENOUS | Status: AC | PRN
  Administered 2016-09-21: 500 [IU]
  Filled 2016-09-21: qty 5

## 2016-09-21 NOTE — Progress Notes (Signed)
SYMPTOM MANAGEMENT CLINIC    Chief Complaint: Left leg and ankle injury  HPI:  Krystal [name] 51 y.o. female diagnosed with left breast cancer currently undergoing therapy with anastrozole and Herceptin. The patient was seen in the infusion room today at the request of nursing. Had a fall approximately 6 days ago in her shower. She is having ongoing pain, swelling, and numbness to her left leg. Patient says that she fell in her shower landed on her left shin. Immediately following the fall she placed ice on the leg for approximately 30 minutes, but has not been using ice since then. With that she had left or pain medication which she took for the pain. The patient's continue to have swelling over her left shin and into her left ankle. She now notices a bruise to her left foot. She reports numbness to the leg. She is able to move her toes as well as her ankle. Patient is requesting an x-ray of her leg.     Cancer of central portion of left female breast ([place])   04/16/2014 Surgery    Bilateral mastectomies: Left breast : Multifocal invasive ductal carcinoma positive for lymphovascular invasion, 1.2 cm and 0.6 cm, grade 3, high-grade DCIS with comedonecrosis, 4 SLN negative T1 C. N0 M0 stage IA: Oncotype DX 13 (ROR 9%)      04/16/2014 Surgery    Left upper back melanoma resected no residual melanoma identified on re-resection margins negative      05/26/2014 - 06/08/2015 Anti-estrogen oral therapy    Tamoxifen 20 mg daily with a plan to switch her to aromatase inhibitors once she gets oophorectomy ( patient did not continue antiestrogen therapy by choice)      08/13/2014 Surgery    oophorectomy with total abdominal hysterectomy      09/08/2015 -  Anti-estrogen oral therapy    Resumed anastrozole after she found recurrence of breast cancer      09/16/2015 Surgery    Skin, left: IDC grade 3; 0.5 cm, margins negative, ER 95%, PR 10%, HER-2 positive ratio 2.08      11/08/2015 -  Chemotherapy   Herceptin every 3 weeks plus anastrozole      11/24/2015 - 01/14/2016 Radiation Therapy    Adjuvant radiation therapy by Dr. Sondra Come       Review of Systems  Constitutional: Negative.   Respiratory: Negative.   Cardiovascular: Negative.   Gastrointestinal: Negative.   Musculoskeletal: Positive for falls.  Skin:       Bruise to left foot  Neurological: Negative.   Endo/Heme/Allergies: Negative.   Psychiatric/Behavioral: Negative.     Past Medical History:  Diagnosis Date  . Arthritis    shoulders  . BRCA1 positive    BRCA1 [id number]  . Chest wall mass 09/2015  . Complication of anesthesia    respiratory depression/arrest with Dilaudid  . Cough with sputum 09/10/2015   clear sputum, per pt.  . Dental crowns present   . GERD (gastroesophageal reflux disease)   . History of breast cancer 2015  . PONV (postoperative nausea and vomiting)   . Stuffy and runny nose 09/10/2015   green drainage from nose, per pt.    Past Surgical History:  Procedure Laterality Date  . ABDOMINAL HYSTERECTOMY    . BREAST LUMPECTOMY Left 09/16/2015   Procedure: LEFT BREAST MASS EXCISION;  Surgeon: Rolm Bookbinder, MD;  Location: [place];  Service: General;  Laterality: Left;  . BREAST RECONSTRUCTION WITH PLACEMENT OF TISSUE EXPANDER AND FLEX  HD (ACELLULAR HYDRATED DERMIS) Bilateral 04/16/2014   Procedure: BILATERAL BREAST RECONSTRUCTION WITH PLACEMENT OF TISSUE EXPANDER AND FLEX HD (ACELLULAR HYDRATED DERMIS);  Surgeon: Irene Limbo, MD;  Location: [place];  Service: Plastics;  Laterality: Bilateral;  . CESAREAN SECTION  1999; 2001; 2005  . CHOLECYSTECTOMY  1995  . COLONOSCOPY    . DEBRIDEMENT AND CLOSURE WOUND Bilateral 05/15/2014   Procedure: DEBRIDEMENT AND CLOSURE OF BILATERAL MASECTOMY INCISIONS WITH BREAST EXPANSION;  Surgeon: Irene Limbo, MD;  Location: [place];  Service: Plastics;  Laterality: Bilateral;  . LAPAROSCOPIC ASSISTED  VAGINAL HYSTERECTOMY N/A 08/13/2014   Procedure: OPEN LAPAROSCOPY, EXPLORATORY LAPAROTOMY, TOTAL ABDOMINAL HYSTERECTOMY WITH BILATERAL SALPINGECTOMY AND BILATERAL OOPHORECTOMY;  Surgeon: Darlyn Chamber, MD;  Location: [place];  Service: Gynecology;  Laterality: N/A;  . LIPOSUCTION WITH LIPOFILLING Bilateral 11/13/2014   Procedure: LIPOFILLING TO BILATERAL CHEST ;  Surgeon: Irene Limbo, MD;  Location: [place];  Service: Plastics;  Laterality: Bilateral;  . MELANOMA EXCISION N/A 04/16/2014   Procedure: WIDE LOCAL EXCISION OF BACK MELANOMA;  Surgeon: Rolm Bookbinder, MD;  Location: [place];  Service: General;  Laterality: N/A;  . PORTACATH PLACEMENT Right 10/21/2015   Procedure: INSERTION PORT-A-CATH WITH ULTRASOUND ;  Surgeon: Rolm Bookbinder, MD;  Location: [place];  Service: General;  Laterality: Right;  . REMOVAL OF BILATERAL TISSUE EXPANDERS WITH PLACEMENT OF BILATERAL BREAST IMPLANTS Bilateral 11/13/2014   Procedure: REMOVAL OF BILATERAL TISSUE EXPANDERS WITH PLACEMENT OF BILATERAL SILICONE IMPLANTS ;  Surgeon: Irene Limbo, MD;  Location: [place];  Service: Plastics;  Laterality: Bilateral;  . SIMPLE MASTECTOMY WITH AXILLARY SENTINEL NODE BIOPSY Bilateral 04/16/2014   Procedure: BILATERAL SKIN SPARING  MASTECTOMIES WITH LEFT AXILLARY SENTINEL NODE BIOPSY;  Surgeon: Rolm Bookbinder, MD;  Location: [place];  Service: General;  Laterality: Bilateral;    has Cancer of central portion of left female breast ([place]); Melanoma ([place]); Family history of malignant neoplasm of breast; BRCA1 positive; S/P TAH-BSO (total abdominal hysterectomy and bilateral salpingo-oophorectomy); Acquired absence of breast and nipple; and Chemotherapy induced cardiomyopathy ([place]) on her problem list.    is allergic to dilaudid [hydromorphone] and codeine.  Allergies as of 09/21/2016      Reactions   Dilaudid  [hydromorphone] Other (See Comments)   RESPIRATORY DEPRESSION   Codeine Nausea And Vomiting      Medication List       Accurate as of 09/21/16  4:23 PM. Always use your most recent med list.          acetaminophen 500 MG tablet Commonly known as:  TYLENOL Take 500 mg by mouth every 6 (six) hours as needed. Reported on 02/03/2016   ADVIL PM PO Take by mouth. Reported on 01/05/2016   anastrozole 1 MG tablet Commonly known as:  ARIMIDEX Take 1 tablet (1 mg total) by mouth daily.   carvedilol 3.125 MG tablet Commonly known as:  COREG Take 1 tablet (3.125 mg total) by mouth 2 (two) times daily.   GAVISCON PO Take 2 tablets by mouth daily as needed (for heartburn). Reported on 02/03/2016   lidocaine-prilocaine cream Commonly known as:  EMLA Apply 1 application topically as needed.   losartan 25 MG tablet Commonly known as:  COZAAR Take 1 tablet (25 mg total) by mouth daily.   ondansetron 4 MG disintegrating tablet Commonly known as:  ZOFRAN-ODT Take 1 tablet (4 mg total) by mouth every 6 (six) hours as needed.  Reported on 12/13/2015   permethrin 5 % cream Commonly known as:  ELIMITE Reported on 01/05/2016        PHYSICAL EXAMINATION  Oncology Vitals 09/21/2016 09/12/2016  Height - -  Weight - 119.183 kg  Weight (lbs) - 262 lbs 12 oz  BMI (kg/m2) - 45.1 kg/m2  Temp 99.3 -  Pulse 85 86  Resp 18 -  SpO2 100 98  BSA (m2) - 2.32 m2   BP Readings from Last 2 Encounters:  09/21/16 125/79  09/12/16 138/86    Physical Exam  Constitutional: She is oriented to person, place, and time and well-developed, well-nourished, and in no distress. No distress.  Musculoskeletal: She exhibits edema and tenderness.  Numbness and 1+ edema to the left ankle. The patient also reports tenderness with light palpation to the left shin. Skin temperature is warm. Normal range of motion to the left ankle, knee, and toes.  Neurological: She is alert and oriented to person, place, and time.    Skin: She is not diaphoretic.  Psychiatric: Mood, memory, affect and judgment normal.    LABORATORY DATA:. No visits with results within 3 Day(s) from this visit.  Latest known visit with results is:  Appointment on 07/19/2016  Component Date Value Ref Range Status  . WBC 07/19/2016 5.6  3.9 - 10.3 10e3/uL Final  . NEUT# 07/19/2016 3.6  1.5 - 6.5 10e3/uL Final  . HGB 07/19/2016 13.9  11.6 - 15.9 g/dL Final  . HCT 07/19/2016 42.5  34.8 - 46.6 % Final  . Platelets 07/19/2016 249  145 - 400 10e3/uL Final  . MCV 07/19/2016 91.1  79.5 - 101.0 fL Final  . MCH 07/19/2016 29.9  25.1 - 34.0 pg Final  . MCHC 07/19/2016 32.8  31.5 - 36.0 g/dL Final  . RBC 07/19/2016 4.66  3.70 - 5.45 10e6/uL Final  . RDW 07/19/2016 13.6  11.2 - 14.5 % Final  . lymph# 07/19/2016 1.4  0.9 - 3.3 10e3/uL Final  . MONO# 07/19/2016 0.3  0.1 - 0.9 10e3/uL Final  . Eosinophils Absolute 07/19/2016 0.3  0.0 - 0.5 10e3/uL Final  . Basophils Absolute 07/19/2016 0.0  0.0 - 0.1 10e3/uL Final  . NEUT% 07/19/2016 63.4  38.4 - 76.8 % Final  . LYMPH% 07/19/2016 25.3  14.0 - 49.7 % Final  . MONO% 07/19/2016 5.8  0.0 - 14.0 % Final  . EOS% 07/19/2016 4.7  0.0 - 7.0 % Final  . BASO% 07/19/2016 0.8  0.0 - 2.0 % Final  . Sodium 07/19/2016 142  136 - 145 mEq/L Final  . Potassium 07/19/2016 4.0  3.5 - 5.1 mEq/L Final  . Chloride 07/19/2016 106  98 - 109 mEq/L Final  . CO2 07/19/2016 27  22 - 29 mEq/L Final  . Glucose 07/19/2016 156* 70 - 140 mg/dl Final  . BUN 07/19/2016 8.2  7.0 - 26.0 mg/dL Final  . Creatinine 07/19/2016 0.8  0.6 - 1.1 mg/dL Final  . Total Bilirubin 07/19/2016 0.29  0.20 - 1.20 mg/dL Final  . Alkaline Phosphatase 07/19/2016 94  40 - 150 U/L Final  . AST 07/19/2016 21  5 - 34 U/L Final  . ALT 07/19/2016 34  0 - 55 U/L Final  . Total Protein 07/19/2016 7.5  6.4 - 8.3 g/dL Final  . Albumin 07/19/2016 3.6  3.5 - 5.0 g/dL Final  . Calcium 07/19/2016 9.1  8.4 - 10.4 mg/dL Final  . Anion Gap 07/19/2016 10  3 - 11  mEq/L Final  .  EGFR 07/19/2016 >90  >90 ml/min/1.73 m2 Final  . T3 Uptake Ratio 07/19/2016 21* 24 - 39 % Final  . Free Thyroxine Index 07/19/2016 1.4  1.2 - 4.9 Final  . Thyroxine (T4) 07/19/2016 6.8  4.5 - 12.0 ug/dL Final  . T4,Free(Direct) 07/19/2016 1.01  0.82 - 1.77 ng/dL Final  . TSH 07/19/2016 1.056  0.308 - 3.960 m(IU)/L Final    RADIOGRAPHIC STUDIES: No results found.  ASSESSMENT/PLAN:    No problem-specific Assessment & Plan notes found for this encounter.  This is a 51 year old female with a recent fall. The patient has ongoing pain to her left ankle and shin as well as edema. She has bruising to her left foot. No limitations in her range of motion. Discussed with patient that she should breast her leg and also put ice on it several times a day. She should also elevate the leg is much as possible. She may obtain a compression stocking for this left like to help with the swelling. Her her request, I have ordered an x-ray of the left ankle as well as the left tibia and fibula. I explained to her that if there is evidence of a fracture she will need to be seen by orthopedics. If she has ongoing pain and edema and numbness to the absence of a fracture, she may need be seen by sports medicine.  We will follow-up on her x-rays when they are available with further instructions.  Patient stated understanding of all instructions; and was in agreement with this plan of care. The patient knows to call the clinic with any problems, questions or concerns.   Total time spent with patient was 20 minutes;  with greater than 50 percent of that time spent in face to face counseling regarding patient's symptoms,  and coordination of care and follow up.   Mikey Bussing, NP 09/21/2016

## 2016-09-21 NOTE — Patient Instructions (Signed)
[facility] Discharge Instructions for Patients Receiving Chemotherapy  Today you received the following chemotherapy agents:  Herceptin  To help prevent nausea and vomiting after your treatment, we encourage you to take your nausea medication as prescribed.   If you develop nausea and vomiting that is not controlled by your nausea medication, call the clinic.   BELOW ARE SYMPTOMS THAT SHOULD BE REPORTED IMMEDIATELY:  *FEVER GREATER THAN 100.5 F  *CHILLS WITH OR WITHOUT FEVER  NAUSEA AND VOMITING THAT IS NOT CONTROLLED WITH YOUR NAUSEA MEDICATION  *UNUSUAL SHORTNESS OF BREATH  *UNUSUAL BRUISING OR BLEEDING  TENDERNESS IN MOUTH AND THROAT WITH OR WITHOUT PRESENCE OF ULCERS  *URINARY PROBLEMS  *BOWEL PROBLEMS  UNUSUAL RASH Items with * indicate a potential emergency and should be followed up as soon as possible.  Feel free to call the clinic you have any questions or concerns. The clinic phone number is [phone].  Please show the CHEMO ALERT CARD at check-in to the Emergency Department and triage nurse.   

## 2016-09-21 NOTE — Progress Notes (Signed)
Patient arrived complaining of Left shin pain. Reported that she fell Friday on the tub. Assessed with swelling noted to foot, ankle, and left calf. Bruising noted to left shin and ankle. Curcio notified and came over to assess patient. Xray ordered post Herceptin. Patient ambulatory to radiology with no further complaints.   Wylene Simmer, BSN, RN 09/21/2016 5:19 PM

## 2016-09-22 ENCOUNTER — Telehealth: Payer: Self-pay | Admitting: *Deleted

## 2016-09-22 NOTE — Telephone Encounter (Signed)
TCT patient with results of fi/tib and ankle xray done yesterday evening. No fractures. Only some soft tissue swelling. Pt voiced understanding. Advised pt to keep elevated and iced for pain management. Again pt voiced understanding.  No further questions or concerns.

## 2016-09-26 ENCOUNTER — Telehealth: Payer: Self-pay | Admitting: Hematology and Oncology

## 2016-09-26 NOTE — Telephone Encounter (Signed)
Pt called requesting that her appts be scheduled for later in the day. Informed pt that MD clinic days on her scheduled visits in the afternoon that is why she is scheduled in the morning. Pt wanted appts moved to the late afternoon to  accommodate her work schedule. Moved appts and gave pt new appt dates/times per request

## 2016-10-11 ENCOUNTER — Other Ambulatory Visit: Payer: Commercial Managed Care - PPO

## 2016-10-11 ENCOUNTER — Ambulatory Visit: Payer: Commercial Managed Care - PPO

## 2016-10-11 ENCOUNTER — Ambulatory Visit: Payer: Commercial Managed Care - PPO | Admitting: Hematology and Oncology

## 2016-10-12 ENCOUNTER — Encounter: Payer: Self-pay | Admitting: Hematology and Oncology

## 2016-10-12 ENCOUNTER — Ambulatory Visit (HOSPITAL_BASED_OUTPATIENT_CLINIC_OR_DEPARTMENT_OTHER): Payer: Commercial Managed Care - PPO

## 2016-10-12 ENCOUNTER — Ambulatory Visit (HOSPITAL_BASED_OUTPATIENT_CLINIC_OR_DEPARTMENT_OTHER): Payer: Commercial Managed Care - PPO | Admitting: Hematology and Oncology

## 2016-10-12 ENCOUNTER — Other Ambulatory Visit (HOSPITAL_BASED_OUTPATIENT_CLINIC_OR_DEPARTMENT_OTHER): Payer: Commercial Managed Care - PPO

## 2016-10-12 DIAGNOSIS — C50112 Malignant neoplasm of central portion of left female breast: Secondary | ICD-10-CM

## 2016-10-12 DIAGNOSIS — Z170 Estrogen receptor positive status [ER+]: Secondary | ICD-10-CM

## 2016-10-12 DIAGNOSIS — Z5112 Encounter for antineoplastic immunotherapy: Secondary | ICD-10-CM

## 2016-10-12 LAB — COMPREHENSIVE METABOLIC PANEL
ALT: 43 U/L (ref 0–55)
AST: 27 U/L (ref 5–34)
Albumin: 4 g/dL (ref 3.5–5.0)
Alkaline Phosphatase: 104 U/L (ref 40–150)
Anion Gap: 9 mEq/L (ref 3–11)
BUN: 10.3 mg/dL (ref 7.0–26.0)
CO2: 28 mEq/L (ref 22–29)
Calcium: 9.4 mg/dL (ref 8.4–10.4)
Chloride: 103 mEq/L (ref 98–109)
Creatinine: 0.8 mg/dL (ref 0.6–1.1)
EGFR: 88 mL/min/{1.73_m2} — ABNORMAL LOW (ref >90)
Glucose: 89 mg/dl (ref 70–140)
Potassium: 4.3 mEq/L (ref 3.5–5.1)
Sodium: 141 mEq/L (ref 136–145)
Total Bilirubin: 0.43 mg/dL (ref 0.20–1.20)
Total Protein: 8 g/dL (ref 6.4–8.3)

## 2016-10-12 LAB — CBC WITH DIFFERENTIAL/PLATELET
BASO%: 4.4 % — ABNORMAL HIGH (ref 0.0–2.0)
Basophils Absolute: 0.2 10*3/uL — ABNORMAL HIGH (ref 0.0–0.1)
EOS%: 6.1 % (ref 0.0–7.0)
Eosinophils Absolute: 0.3 10*3/uL (ref 0.0–0.5)
HCT: 42.2 % (ref 34.8–46.6)
HGB: 14 g/dL (ref 11.6–15.9)
LYMPH%: 15.4 % (ref 14.0–49.7)
MCH: 30 pg (ref 25.1–34.0)
MCHC: 33.2 g/dL (ref 31.5–36.0)
MCV: 90.4 fL (ref 79.5–101.0)
MONO#: 0.3 10*3/uL (ref 0.1–0.9)
MONO%: 6 % (ref 0.0–14.0)
NEUT#: 3.7 10*3/uL (ref 1.5–6.5)
NEUT%: 68.1 % (ref 38.4–76.8)
Platelets: 270 10*3/uL (ref 145–400)
RBC: 4.67 10*6/uL (ref 3.70–5.45)
RDW: 13.8 % (ref 11.2–14.5)
WBC: 5.5 10*3/uL (ref 3.9–10.3)
lymph#: 0.8 10*3/uL — ABNORMAL LOW (ref 0.9–3.3)

## 2016-10-12 MED ORDER — SODIUM CHLORIDE 0.9 % IV SOLN
Freq: Once | INTRAVENOUS | Status: AC
  Administered 2016-10-12: 16:00:00 via INTRAVENOUS

## 2016-10-12 MED ORDER — LETROZOLE 2.5 MG PO TABS: 2.5000 mg | ORAL_TABLET | Freq: Every day | ORAL | 0 refills | Status: DC

## 2016-10-12 MED ORDER — DIPHENHYDRAMINE HCL 25 MG PO CAPS
ORAL_CAPSULE | ORAL | Status: AC
  Filled 2016-10-12: qty 2

## 2016-10-12 MED ORDER — DIPHENHYDRAMINE HCL 25 MG PO CAPS
50.0000 mg | ORAL_CAPSULE | Freq: Once | ORAL | Status: AC
  Administered 2016-10-12: 50 mg via ORAL

## 2016-10-12 MED ORDER — ACETAMINOPHEN 325 MG PO TABS
650.0000 mg | ORAL_TABLET | Freq: Once | ORAL | Status: AC
  Administered 2016-10-12: 650 mg via ORAL

## 2016-10-12 MED ORDER — HEPARIN SOD (PORK) LOCK FLUSH 100 UNIT/ML IV SOLN
500.0000 [IU] | Freq: Once | INTRAVENOUS | Status: AC | PRN
  Administered 2016-10-12: 500 [IU]
  Filled 2016-10-12: qty 5

## 2016-10-12 MED ORDER — SODIUM CHLORIDE 0.9% FLUSH
10.0000 mL | INTRAVENOUS | Status: DC | PRN
  Administered 2016-10-12: 10 mL
  Filled 2016-10-12: qty 10

## 2016-10-12 MED ORDER — TRASTUZUMAB CHEMO 150 MG IV SOLR
6.0000 mg/kg | Freq: Once | INTRAVENOUS | Status: AC
  Administered 2016-10-12: 693 mg via INTRAVENOUS
  Filled 2016-10-12: qty 33

## 2016-10-12 MED ORDER — ACETAMINOPHEN 325 MG PO TABS
ORAL_TABLET | ORAL | Status: AC
  Filled 2016-10-12: qty 2

## 2016-10-12 NOTE — Progress Notes (Signed)
Patient Care Team: [name] as PCP - General (General Practice)  DIAGNOSIS:  Encounter Diagnosis  Name Primary?  . 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 N0 M0 stage IA: Oncotype DX 13 (ROR 9%)      04/16/2014 Surgery    Left upper back melanoma resected no residual melanoma identified on re-resection margins negative      05/26/2014 - 06/08/2015 Anti-estrogen oral therapy    Tamoxifen 20 mg daily with a plan to switch her to aromatase inhibitors once she gets oophorectomy ( patient did not continue antiestrogen therapy by choice)      08/13/2014 Surgery    oophorectomy with total abdominal hysterectomy      09/08/2015 -  Anti-estrogen oral therapy    Resumed anastrozole after she found recurrence of breast cancer; stopped in January 2018, started letrozole 2.5 mg daily 10/12/2016      09/16/2015 Surgery    Skin, left: IDC grade 3; 0.5 cm, margins negative, ER 95%, PR 10%, HER-2 positive ratio 2.08      11/08/2015 -  Chemotherapy    Herceptin every 3 weeks plus anastrozole      11/24/2015 - 01/14/2016 Radiation Therapy    Adjuvant radiation therapy by Dr. Sondra Come       CHIEF COMPLIANT: Follow-up on Herceptin  INTERVAL HISTORY: Krystal [name] is a 51 year old with above-mentioned history of recurrent breast cancer who is currently on Herceptin maintenance. She is tolerating Herceptin fairly well. She complains of fatigue as well as mild leg swelling. She is seeing cardiology in April.  REVIEW OF SYSTEMS:   Constitutional: Denies fevers, chills or abnormal weight loss Eyes: Denies blurriness of  vision Ears, nose, mouth, throat, and face: Denies mucositis or sore throat Respiratory: Denies cough, dyspnea or wheezes Cardiovascular: Denies palpitation, chest discomfort Gastrointestinal:  Denies nausea, heartburn or change in bowel habits Skin: Denies abnormal skin rashes Lymphatics: Denies new lymphadenopathy or easy bruising Neurological:Denies numbness, tingling or new weaknesses Behavioral/Psych: Mood is stable, no new changes  Extremities: 2+ left lower extremity edema Breast:  denies any pain or lumps or nodules in either breasts All other systems were reviewed with the patient and are negative.  I have reviewed the past medical history, past surgical history, social history and family history with the patient and they are unchanged from previous note.  ALLERGIES:  is allergic to dilaudid [hydromorphone] and codeine.  MEDICATIONS:  Current Outpatient Prescriptions  Medication Sig Dispense Refill  . acetaminophen (TYLENOL) 500 MG tablet Take 500 mg by mouth every 6 (six) hours as needed. Reported on 02/03/2016    . Alum Hydroxide-Mag Carbonate (GAVISCON PO) Take 2 tablets by mouth daily as needed (for heartburn). Reported on 02/03/2016    . carvedilol (COREG) 3.125 MG tablet Take 1 tablet (3.125 mg total) by mouth 2 (two) times daily. 60 tablet 3  . Ibuprofen-Diphenhydramine Cit (ADVIL PM PO) Take by mouth. Reported on 01/05/2016    . letrozole (FEMARA) 2.5 MG tablet Take 1 tablet (2.5 mg total) by mouth daily. 30 tablet 0  . lidocaine-prilocaine (EMLA) cream Apply 1 application topically as needed. 30 g 1  .  losartan (COZAAR) 25 MG tablet Take 1 tablet (25 mg total) by mouth daily. 30 tablet 6  . ondansetron (ZOFRAN-ODT) 4 MG disintegrating tablet Take 1 tablet (4 mg total) by mouth every 6 (six) hours as needed. Reported on 12/13/2015 20 tablet 5  . permethrin (ELIMITE) 5 % cream Reported on 01/05/2016     No current facility-administered medications for this visit.      PHYSICAL EXAMINATION: ECOG PERFORMANCE STATUS: 1 - Symptomatic but completely ambulatory  Vitals:   10/12/16 1456  BP: 119/72  Pulse: 80  Resp: 20  Temp: 97.7 F (36.5 C)   Filed Weights   10/12/16 1456  Weight: 266 lb 1.6 oz (120.7 kg)    GENERAL:alert, no distress and comfortable SKIN: skin color, texture, turgor are normal, no rashes or significant lesions EYES: normal, Conjunctiva are pink and non-injected, sclera clear OROPHARYNX:no exudate, no erythema and lips, buccal mucosa, and tongue normal  NECK: supple, thyroid normal size, non-tender, without nodularity LYMPH:  no palpable lymphadenopathy in the cervical, axillary or inguinal LUNGS: clear to auscultation and percussion with normal breathing effort HEART: regular rate & rhythm and no murmurs and no lower extremity edema ABDOMEN:abdomen soft, non-tender and normal bowel sounds MUSCULOSKELETAL:no cyanosis of digits and no clubbing  NEURO: alert & oriented x 3 with fluent speech, no focal motor/sensory deficits EXTREMITIES: 2+ left lower extremity edema  LABORATORY DATA:  I have reviewed the data as listed   Chemistry      Component Value Date/Time   NA 142 07/19/2016 1320   K 4.0 07/19/2016 1320   CL 102 01/03/2016 0855   CO2 27 07/19/2016 1320   BUN 8.2 07/19/2016 1320   CREATININE 0.8 07/19/2016 1320      Component Value Date/Time   CALCIUM 9.1 07/19/2016 1320   ALKPHOS 94 07/19/2016 1320   AST 21 07/19/2016 1320   ALT 34 07/19/2016 1320   BILITOT 0.29 07/19/2016 1320       Lab Results  Component Value Date   WBC 5.5 10/12/2016   HGB 14.0 10/12/2016   HCT 42.2 10/12/2016   MCV 90.4 10/12/2016   PLT 270 10/12/2016   NEUTROABS 3.7 10/12/2016    ASSESSMENT & PLAN:  Cancer of central portion of left female breast T1 C. N0 M0 stage IA ER 100%, PR 95%, HER-2 negative ratio 1.9, Ki-67 15% with additional satellite nodule 0.6 cm: Oncotype DX recurrence score is 13 (9% risk of  recurrence)  Antiestrogen therapy: Patient took tamoxifen from 05/26/2014 to 06/07/2014 and stopped it because of itching. Patient had undergone oophorectomy with TAH for reduction of risk of ovarian cancer.  I recommended that she start on antiestrogen treatment again. I prescribed her letrozole 2.5 mg daily for 30 tablets. If she tolerates it well then she can be given a full prescription.  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 Adjuvant radiation started 11/24/2015- completed 01/14/2016 2. Herceptin every 3 weeks started 11/08/2015 with anastrozole. Because of decline in the heart ejection fraction, Herceptin was on hold and then resumed.  Herceptin toxicities: 1. First infusion reaction with shaking chills and rigors 2. Decline in the heart ejection fraction:resumed Herceptin. Follows with cardiology closely  Current treatment: 1.Anastrozole restarted 03/13/2016 stopped in January 2018, started letrozole 10/12/2016 2. Herceptin to be completed July 2018  RTC in 3 weeks for herceptin and every 6 weeks for  follow up with me   I spent 25 minutes talking to the patient of which more than half was spent in counseling and coordination of care.  No orders of the defined types were placed in this encounter.  The patient has a good understanding of the overall plan. she agrees with it. she will call with any problems that may develop before the next visit here.   Rulon Eisenmenger, MD 10/12/16

## 2016-10-12 NOTE — Assessment & Plan Note (Signed)
T1 C. N0 M0 stage IA ER 100%, PR 95%, HER-2 negative ratio 1.9, Ki-67 15% with additional satellite nodule 0.6 cm: Oncotype DX recurrence score is 13 (9% risk of recurrence)  Antiestrogen therapy: Patient took tamoxifen from 05/26/2014 to 06/07/2014 and stopped it because of itching. Patient had undergone oophorectomy with TAH for reduction of risk of ovarian cancer.  I recommended that she start on antiestrogen treatment again. I prescribed her letrozole 2.5 mg daily for 30 tablets. If she tolerates it well then she can be given a full prescription.  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 Adjuvant radiation started 11/24/2015- completed 01/14/2016 2. Herceptin every 3 weeks started 11/08/2015 with anastrozole. Because of decline in the heart ejection fraction, Herceptin was on hold and then resumed.  Herceptin toxicities: 1. First infusion reaction with shaking chills and rigors 2. Decline in the heart ejection fraction:resumed Herceptin. Follows with cardiology closely  Current treatment: 1.Anastrozole restarted 03/13/2016 stopped in January 2018, started letrozole 10/12/2016 2. Herceptin to be completed July 2018  RTC in 3 weeks for herceptin and every 6 weeks for follow up with me

## 2016-10-12 NOTE — Patient Instructions (Signed)
[facility] Discharge Instructions for Patients Receiving Chemotherapy  Today you received the following chemotherapy agents:  Herceptin  To help prevent nausea and vomiting after your treatment, we encourage you to take your nausea medication as prescribed.   If you develop nausea and vomiting that is not controlled by your nausea medication, call the clinic.   BELOW ARE SYMPTOMS THAT SHOULD BE REPORTED IMMEDIATELY:  *FEVER GREATER THAN 100.5 F  *CHILLS WITH OR WITHOUT FEVER  NAUSEA AND VOMITING THAT IS NOT CONTROLLED WITH YOUR NAUSEA MEDICATION  *UNUSUAL SHORTNESS OF BREATH  *UNUSUAL BRUISING OR BLEEDING  TENDERNESS IN MOUTH AND THROAT WITH OR WITHOUT PRESENCE OF ULCERS  *URINARY PROBLEMS  *BOWEL PROBLEMS  UNUSUAL RASH Items with * indicate a potential emergency and should be followed up as soon as possible.  Feel free to call the clinic you have any questions or concerns. The clinic phone number is [phone].  Please show the CHEMO ALERT CARD at check-in to the Emergency Department and triage nurse.   

## 2016-10-13 ENCOUNTER — Telehealth: Payer: Self-pay | Admitting: Hematology and Oncology

## 2016-10-13 NOTE — Telephone Encounter (Signed)
Patient called to r/s appts to later time. Patient is aware of new time and dates for appts.

## 2016-11-01 ENCOUNTER — Ambulatory Visit (HOSPITAL_BASED_OUTPATIENT_CLINIC_OR_DEPARTMENT_OTHER): Payer: Commercial Managed Care - PPO

## 2016-11-01 VITALS — BP 137/73 | HR 91 | Temp 98.7°F | Resp 20

## 2016-11-01 DIAGNOSIS — C50112 Malignant neoplasm of central portion of left female breast: Secondary | ICD-10-CM | POA: Diagnosis not present

## 2016-11-01 DIAGNOSIS — Z5112 Encounter for antineoplastic immunotherapy: Secondary | ICD-10-CM | POA: Diagnosis not present

## 2016-11-01 MED ORDER — TRASTUZUMAB CHEMO 150 MG IV SOLR
6.0000 mg/kg | Freq: Once | INTRAVENOUS | Status: AC
  Administered 2016-11-01: 693 mg via INTRAVENOUS
  Filled 2016-11-01: qty 33

## 2016-11-01 MED ORDER — ACETAMINOPHEN 325 MG PO TABS
650.0000 mg | ORAL_TABLET | Freq: Once | ORAL | Status: AC
  Administered 2016-11-01: 650 mg via ORAL

## 2016-11-01 MED ORDER — SODIUM CHLORIDE 0.9 % IV SOLN
Freq: Once | INTRAVENOUS | Status: AC
  Administered 2016-11-01: 16:00:00 via INTRAVENOUS

## 2016-11-01 MED ORDER — SODIUM CHLORIDE 0.9% FLUSH
10.0000 mL | INTRAVENOUS | Status: DC | PRN
  Administered 2016-11-01: 10 mL
  Filled 2016-11-01: qty 10

## 2016-11-01 MED ORDER — ACETAMINOPHEN 325 MG PO TABS
ORAL_TABLET | ORAL | Status: AC
  Filled 2016-11-01: qty 2

## 2016-11-01 MED ORDER — HEPARIN SOD (PORK) LOCK FLUSH 100 UNIT/ML IV SOLN
500.0000 [IU] | Freq: Once | INTRAVENOUS | Status: AC | PRN
  Administered 2016-11-01: 500 [IU]
  Filled 2016-11-01: qty 5

## 2016-11-01 MED ORDER — DIPHENHYDRAMINE HCL 25 MG PO CAPS
50.0000 mg | ORAL_CAPSULE | Freq: Once | ORAL | Status: AC
  Administered 2016-11-01: 50 mg via ORAL

## 2016-11-01 MED ORDER — DIPHENHYDRAMINE HCL 25 MG PO CAPS
ORAL_CAPSULE | ORAL | Status: AC
  Filled 2016-11-01: qty 2

## 2016-11-01 NOTE — Patient Instructions (Signed)
[facility] Discharge Instructions for Patients Receiving Chemotherapy  Today you received the following chemotherapy agents:  Herceptin  To help prevent nausea and vomiting after your treatment, we encourage you to take your nausea medication as prescribed.   If you develop nausea and vomiting that is not controlled by your nausea medication, call the clinic.   BELOW ARE SYMPTOMS THAT SHOULD BE REPORTED IMMEDIATELY:  *FEVER GREATER THAN 100.5 F  *CHILLS WITH OR WITHOUT FEVER  NAUSEA AND VOMITING THAT IS NOT CONTROLLED WITH YOUR NAUSEA MEDICATION  *UNUSUAL SHORTNESS OF BREATH  *UNUSUAL BRUISING OR BLEEDING  TENDERNESS IN MOUTH AND THROAT WITH OR WITHOUT PRESENCE OF ULCERS  *URINARY PROBLEMS  *BOWEL PROBLEMS  UNUSUAL RASH Items with * indicate a potential emergency and should be followed up as soon as possible.  Feel free to call the clinic you have any questions or concerns. The clinic phone number is [phone].  Please show the CHEMO ALERT CARD at check-in to the Emergency Department and triage nurse.   

## 2016-11-08 ENCOUNTER — Other Ambulatory Visit: Payer: Commercial Managed Care - PPO

## 2016-11-10 ENCOUNTER — Encounter (HOSPITAL_COMMUNITY): Payer: Self-pay | Admitting: Internal Medicine

## 2016-11-10 ENCOUNTER — Ambulatory Visit (HOSPITAL_BASED_OUTPATIENT_CLINIC_OR_DEPARTMENT_OTHER)
Admission: RE | Admit: 2016-11-10 | Discharge: 2016-11-10 | Disposition: A | Discharge status: Home / Self Care | Payer: Commercial Managed Care - PPO | Source: Ambulatory Visit | Attending: Internal Medicine | Admitting: Internal Medicine

## 2016-11-10 ENCOUNTER — Ambulatory Visit (HOSPITAL_COMMUNITY)
Admission: RE | Admit: 2016-11-10 | Discharge: 2016-11-10 | Disposition: A | Discharge status: Home / Self Care | Payer: Commercial Managed Care - PPO | Source: Ambulatory Visit | Attending: Internal Medicine | Admitting: Internal Medicine

## 2016-11-10 VITALS — BP 124/80 | HR 75 | Wt 268.0 lb

## 2016-11-10 DIAGNOSIS — K219 Gastro-esophageal reflux disease without esophagitis: Secondary | ICD-10-CM | POA: Diagnosis not present

## 2016-11-10 DIAGNOSIS — M19019 Primary osteoarthritis, unspecified shoulder: Secondary | ICD-10-CM | POA: Diagnosis not present

## 2016-11-10 DIAGNOSIS — Z79811 Long term (current) use of aromatase inhibitors: Secondary | ICD-10-CM | POA: Insufficient documentation

## 2016-11-10 DIAGNOSIS — Z885 Allergy status to narcotic agent status: Secondary | ICD-10-CM | POA: Diagnosis not present

## 2016-11-10 DIAGNOSIS — I427 Cardiomyopathy due to drug and external agent: Secondary | ICD-10-CM | POA: Diagnosis not present

## 2016-11-10 DIAGNOSIS — Z85820 Personal history of malignant melanoma of skin: Secondary | ICD-10-CM | POA: Diagnosis not present

## 2016-11-10 DIAGNOSIS — Z90710 Acquired absence of both cervix and uterus: Secondary | ICD-10-CM | POA: Insufficient documentation

## 2016-11-10 DIAGNOSIS — C50912 Malignant neoplasm of unspecified site of left female breast: Secondary | ICD-10-CM | POA: Diagnosis not present

## 2016-11-10 DIAGNOSIS — C50112 Malignant neoplasm of central portion of left female breast: Secondary | ICD-10-CM | POA: Diagnosis not present

## 2016-11-10 DIAGNOSIS — R5383 Other fatigue: Secondary | ICD-10-CM | POA: Diagnosis not present

## 2016-11-10 DIAGNOSIS — Z1501 Genetic susceptibility to malignant neoplasm of breast: Secondary | ICD-10-CM | POA: Insufficient documentation

## 2016-11-10 DIAGNOSIS — T451X5A Adverse effect of antineoplastic and immunosuppressive drugs, initial encounter: Secondary | ICD-10-CM | POA: Diagnosis not present

## 2016-11-10 DIAGNOSIS — Z79899 Other long term (current) drug therapy: Secondary | ICD-10-CM | POA: Diagnosis not present

## 2016-11-10 DIAGNOSIS — Z87891 Personal history of nicotine dependence: Secondary | ICD-10-CM | POA: Diagnosis not present

## 2016-11-10 DIAGNOSIS — Z9013 Acquired absence of bilateral breasts and nipples: Secondary | ICD-10-CM | POA: Insufficient documentation

## 2016-11-10 NOTE — Addendum Note (Signed)
Encounter addended by: Scarlette Calico, RN on: 11/10/2016  1:09 PM<BR>    Actions taken: Order list changed, Diagnosis association updated

## 2016-11-10 NOTE — Progress Notes (Signed)
Patient ID: Krystal [name], female   DOB: 1966/02/19, 51 y.o.   MRN: [id number]     Cardio-oncology Clinic Progress Note  Referring Physician: Lindi Adie Primary Care: Primary Cardiologist:  HPI: INTERVAL HISTORY: Krystal [name] is a 51 year old with above-mentioned history of bilateral mastectomies for left-sided multifocal invasive ductal carcinoma that was noticed on Oncotype and we recommended hormonal therapy but she was noncompliant and did not take any antiestrogen therapy. She went a full year without any treatment and felt a lump in the breast while she was undergoing breast reconstruction and fat grafting. Lap C was then performed which came back as recurrent invasive ductal carcinoma. She underwent resection on 09/16/2015 by Dr. Donne Hazel and final pathology revealed that this was an ER/PR and HER-2 positive breast cancer. Because of this she was referred back was performed discussion. She tells me that she resume taking anastrozole about a month ago after he was diagnosed with a recurrence. She does complain of arthralgias but otherwise tolerating it fairly well.  Herceptin held in 6/17 for mild toxicity.   She presents today for close follow up.  Herceptin restarted in 8/17. On carvedilol and losartan. Feels well. Tolerating Herceptin. Still with fatigue. Denies SOB/orthopnea.      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 N0 M0 stage IA: Oncotype DX 13 (ROR 9%)   04/16/2014 Surgery Left upper back melanoma resected no residual melanoma identified on re-resection margins negative   05/26/2014 - 08/03/2015 Anti-estrogen oral therapy Tamoxifen 20 mg daily with a plan to switch her to aromatase inhibitors once she gets oophorectomy ( patient did not  continue antiestrogen therapy by choice)   08/13/2014 Surgery oophorectomy with total abdominal hysterectomy   09/08/2015 -  Anti-estrogen oral therapy Resumed anastrozole after she found recurrence of breast cancer   09/16/2015 Surgery Skin, left: IDC grade 3; 0.5 cm, margins negative    11/08/2015 -  Chemotherapy Herceptin every 3 weeks plus anastrozole               3/17   Echo 60-65% Lat S' 10.8 GLS -20 02/03/16 Echo 45-50% Lat S' 9.8 GLS -17.1 03/24/16 Echo 55-60%, Grade 1, Lat S' 11.7, GLS -20.3 06/14/16 Echo 55/60% Grade 1 DD lat s' 11.0 GLS -17.8%  09/12/16  Echo 55-60%  Lat s' 11.4 GLS -15.6% (underestimated due to poor endocardial tracking)  11/10/16  Echo 55-60% Lat s' 10.8 GLS -18.3%   Past Medical History:  Diagnosis Date  . Arthritis    shoulders  . BRCA1 positive    BRCA1 [id number]  . Chest wall mass 09/2015  . Complication of anesthesia    respiratory depression/arrest with Dilaudid  . Cough with sputum 09/10/2015   clear sputum, per pt.  . Dental crowns present   . GERD (gastroesophageal reflux disease)   . History of breast cancer 2015  . PONV (postoperative nausea and vomiting)   . Stuffy and runny nose 09/10/2015   green drainage from nose, per pt.    Current Outpatient Prescriptions  Medication Sig Dispense Refill  . acetaminophen (TYLENOL) 500 MG tablet Take 500 mg by mouth every 6 (six) hours as needed. Reported on 02/03/2016    . Alum Hydroxide-Mag Carbonate (GAVISCON PO) Take 2 tablets by mouth daily as needed (for heartburn). Reported on 02/03/2016    .  carvedilol (COREG) 3.125 MG tablet Take 1 tablet (3.125 mg total) by mouth 2 (two) times daily. 60 tablet 3  . Ibuprofen-Diphenhydramine Cit (ADVIL PM PO) Take by mouth. Reported on 01/05/2016    . letrozole (FEMARA) 2.5 MG tablet Take 1 tablet (2.5 mg total) by mouth daily. 30 tablet 0  . lidocaine-prilocaine (EMLA) cream Apply 1 application topically as needed. 30 g 1  . losartan (COZAAR) 25 MG  tablet Take 1 tablet (25 mg total) by mouth daily. 30 tablet 6  . ondansetron (ZOFRAN-ODT) 4 MG disintegrating tablet Take 1 tablet (4 mg total) by mouth every 6 (six) hours as needed. Reported on 12/13/2015 20 tablet 5  . permethrin (ELIMITE) 5 % cream Reported on 01/05/2016     No current facility-administered medications for this encounter.     Allergies  Allergen Reactions  . Dilaudid [Hydromorphone] Other (See Comments)    RESPIRATORY DEPRESSION  . Codeine Nausea And Vomiting      Social History   Social History  . Marital status: Widowed    Spouse name: N/A  . Number of children: 3  . Years of education: N/A   Occupational History  . Oncologist   Social History Main Topics  . Smoking status: Former Smoker    Packs/day: 0.00    Years: 0.00    Quit date: 04/17/2014  . Smokeless tobacco: Never Used  . Alcohol use Yes     Comment: occasionally  . Drug use: No  . Sexual activity: Not on file   Other Topics Concern  . Not on file   Social History Narrative  . No narrative on file      Family History  Problem Relation Age of Onset  . Breast cancer Paternal Aunt 67    deceased 58s  . Breast cancer Paternal Grandmother     dx 28s; ? if had 2nd BC in 27s; deceased 93s  . Pancreatic cancer Paternal Uncle 34    smoker; deceased    Vitals:   21-Nov-2016 1222  BP: 124/80  Pulse: 75  SpO2: 99%  Weight: 268 lb (121.6 kg)    PHYSICAL EXAM: General:  Well appearing. No resp difficulty HEENT: normal Neck: supple. no JVD. Carotids 2+ bilat; no bruits. No lymphadenopathy or thryomegaly appreciated. Cor: PMI nondisplaced. Regular rate & rhythm. No rubs, gallops or murmurs. Lungs: clear Abdomen: soft, nontender, nondistended. No hepatosplenomegaly. No bruits or masses. Good bowel sounds. Extremities: no cyanosis, clubbing, rash, edema LLE mild edema and hematoma Neuro: alert & orientedx3, cranial nerves grossly intact. moves  all 4 extremities w/o difficulty. Affect pleasant   ASSESSMENT & PLAN: 1. Breast CA, recurrent HER-2-neu+   - I reviewed echos personally. EF and Doppler parameters stable. No HF on exam. Continue Herceptin.    - Will continue Herceptin will finish 7/18. Get echo at end of therapy.   - Continue carvedilol 3.125 mg twice a day. Continue losartan 25 mg daily.    2. Former Smoker- quit April 2017 has restarted some  3. Herceptin-induced cardiomyopathy    - Improved. Back on Herceptin.   4. Fatigue    --Suspect OSA. Refused sleep study in past.   Amore Ackman,MD 12:46 PM

## 2016-11-10 NOTE — Progress Notes (Signed)
  Echocardiogram 2D Echocardiogram has been performed.  Krystal [name] 11/10/2016, 1:02 PM

## 2016-11-10 NOTE — Addendum Note (Signed)
Encounter addended by: Scarlette Calico, RN on: 11/10/2016 12:56 PM<BR>    Actions taken: Sign clinical note

## 2016-11-10 NOTE — Patient Instructions (Signed)
We will contact you in 3 months to schedule your next appointment and echocardiogram  

## 2016-11-21 ENCOUNTER — Ambulatory Visit: Payer: Commercial Managed Care - PPO

## 2016-11-21 ENCOUNTER — Other Ambulatory Visit: Payer: Commercial Managed Care - PPO

## 2016-11-21 ENCOUNTER — Ambulatory Visit: Payer: Commercial Managed Care - PPO | Admitting: Hematology and Oncology

## 2016-11-22 ENCOUNTER — Ambulatory Visit: Payer: Commercial Managed Care - PPO

## 2016-11-22 ENCOUNTER — Ambulatory Visit: Payer: Commercial Managed Care - PPO | Admitting: Hematology and Oncology

## 2016-11-22 ENCOUNTER — Other Ambulatory Visit: Payer: Commercial Managed Care - PPO

## 2016-11-23 ENCOUNTER — Ambulatory Visit (HOSPITAL_BASED_OUTPATIENT_CLINIC_OR_DEPARTMENT_OTHER): Payer: Commercial Managed Care - PPO | Admitting: Hematology and Oncology

## 2016-11-23 ENCOUNTER — Other Ambulatory Visit: Payer: Self-pay | Admitting: Emergency Medicine

## 2016-11-23 ENCOUNTER — Encounter: Payer: Self-pay | Admitting: Hematology and Oncology

## 2016-11-23 ENCOUNTER — Ambulatory Visit (HOSPITAL_BASED_OUTPATIENT_CLINIC_OR_DEPARTMENT_OTHER): Payer: Commercial Managed Care - PPO

## 2016-11-23 ENCOUNTER — Other Ambulatory Visit (HOSPITAL_BASED_OUTPATIENT_CLINIC_OR_DEPARTMENT_OTHER): Payer: Commercial Managed Care - PPO

## 2016-11-23 DIAGNOSIS — C50112 Malignant neoplasm of central portion of left female breast: Secondary | ICD-10-CM

## 2016-11-23 DIAGNOSIS — Z5112 Encounter for antineoplastic immunotherapy: Secondary | ICD-10-CM | POA: Diagnosis not present

## 2016-11-23 DIAGNOSIS — Z170 Estrogen receptor positive status [ER+]: Secondary | ICD-10-CM | POA: Diagnosis not present

## 2016-11-23 LAB — COMPREHENSIVE METABOLIC PANEL
ALT: 46 U/L (ref 0–55)
AST: 32 U/L (ref 5–34)
Albumin: 3.9 g/dL (ref 3.5–5.0)
Alkaline Phosphatase: 92 U/L (ref 40–150)
Anion Gap: 9 mEq/L (ref 3–11)
BUN: 8.8 mg/dL (ref 7.0–26.0)
CO2: 30 mEq/L — ABNORMAL HIGH (ref 22–29)
Calcium: 9.3 mg/dL (ref 8.4–10.4)
Chloride: 103 mEq/L (ref 98–109)
Creatinine: 0.8 mg/dL (ref 0.6–1.1)
EGFR: 89 mL/min/{1.73_m2} — ABNORMAL LOW (ref >90)
Glucose: 108 mg/dl (ref 70–140)
Potassium: 4 mEq/L (ref 3.5–5.1)
Sodium: 142 mEq/L (ref 136–145)
Total Bilirubin: 0.45 mg/dL (ref 0.20–1.20)
Total Protein: 7.5 g/dL (ref 6.4–8.3)

## 2016-11-23 LAB — CBC WITH DIFFERENTIAL/PLATELET
BASO%: 1.1 % (ref 0.0–2.0)
Basophils Absolute: 0.1 10*3/uL (ref 0.0–0.1)
EOS%: 4.9 % (ref 0.0–7.0)
Eosinophils Absolute: 0.3 10*3/uL (ref 0.0–0.5)
HCT: 41.1 % (ref 34.8–46.6)
HGB: 13.8 g/dL (ref 11.6–15.9)
LYMPH%: 26.2 % (ref 14.0–49.7)
MCH: 30.1 pg (ref 25.1–34.0)
MCHC: 33.7 g/dL (ref 31.5–36.0)
MCV: 89.3 fL (ref 79.5–101.0)
MONO#: 0.3 10*3/uL (ref 0.1–0.9)
MONO%: 6.3 % (ref 0.0–14.0)
NEUT#: 3.2 10*3/uL (ref 1.5–6.5)
NEUT%: 61.5 % (ref 38.4–76.8)
Platelets: 238 10*3/uL (ref 145–400)
RBC: 4.6 10*6/uL (ref 3.70–5.45)
RDW: 13.5 % (ref 11.2–14.5)
WBC: 5.2 10*3/uL (ref 3.9–10.3)
lymph#: 1.4 10*3/uL (ref 0.9–3.3)

## 2016-11-23 MED ORDER — SODIUM CHLORIDE 0.9 % IV SOLN
Freq: Once | INTRAVENOUS | Status: AC
  Administered 2016-11-23: 15:00:00 via INTRAVENOUS

## 2016-11-23 MED ORDER — ACETAMINOPHEN 325 MG PO TABS
ORAL_TABLET | ORAL | Status: AC
  Filled 2016-11-23: qty 2

## 2016-11-23 MED ORDER — ACETAMINOPHEN 325 MG PO TABS
650.0000 mg | ORAL_TABLET | Freq: Once | ORAL | Status: AC
  Administered 2016-11-23: 650 mg via ORAL

## 2016-11-23 MED ORDER — SODIUM CHLORIDE 0.9 % IV SOLN
6.0000 mg/kg | Freq: Once | INTRAVENOUS | Status: AC
  Administered 2016-11-23: 693 mg via INTRAVENOUS
  Filled 2016-11-23: qty 33

## 2016-11-23 MED ORDER — HEPARIN SOD (PORK) LOCK FLUSH 100 UNIT/ML IV SOLN
500.0000 [IU] | Freq: Once | INTRAVENOUS | Status: AC | PRN
  Administered 2016-11-23: 500 [IU]
  Filled 2016-11-23: qty 5

## 2016-11-23 MED ORDER — SODIUM CHLORIDE 0.9% FLUSH
10.0000 mL | INTRAVENOUS | Status: DC | PRN
  Administered 2016-11-23: 10 mL
  Filled 2016-11-23: qty 10

## 2016-11-23 MED ORDER — DIPHENHYDRAMINE HCL 25 MG PO CAPS
50.0000 mg | ORAL_CAPSULE | Freq: Once | ORAL | Status: AC
  Administered 2016-11-23: 50 mg via ORAL

## 2016-11-23 MED ORDER — DIPHENHYDRAMINE HCL 25 MG PO CAPS
ORAL_CAPSULE | ORAL | Status: AC
  Filled 2016-11-23: qty 2

## 2016-11-23 NOTE — Progress Notes (Signed)
Patient Care Team: [name] as PCP - General (General Practice)  DIAGNOSIS:  Encounter Diagnosis  Name Primary?  . Malignant neoplasm of central portion of left breast in female, estrogen receptor positive ([place])     SUMMARY OF ONCOLOGIC HISTORY:   Cancer of central portion of left female breast ([place])   04/16/2014 Surgery    Bilateral mastectomies: Left breast : Multifocal invasive ductal carcinoma positive for lymphovascular invasion, 1.2 cm and 0.6 cm, grade 3, high-grade DCIS with comedonecrosis, 4 SLN negative T1 C. N0 M0 stage IA: Oncotype DX 13 (ROR 9%)      04/16/2014 Surgery    Left upper back melanoma resected no residual melanoma identified on re-resection margins negative      05/26/2014 - 06/08/2015 Anti-estrogen oral therapy    Tamoxifen 20 mg daily with a plan to switch her to aromatase inhibitors once she gets oophorectomy ( patient did not continue antiestrogen therapy by choice)      08/13/2014 Surgery    oophorectomy with total abdominal hysterectomy      09/08/2015 -  Anti-estrogen oral therapy    Resumed anastrozole after she found recurrence of breast cancer; stopped in January 2018, started letrozole 2.5 mg daily 10/12/2016      09/16/2015 Surgery    Skin, left: IDC grade 3; 0.5 cm, margins negative, ER 95%, PR 10%, HER-2 positive ratio 2.08      11/08/2015 -  Chemotherapy    Herceptin every 3 weeks plus anastrozole      11/24/2015 - 01/14/2016 Radiation Therapy    Adjuvant radiation therapy by Dr. Sondra Come       CHIEF COMPLIANT: Follow-up on Herceptin  INTERVAL HISTORY: Samayra Hebel is a 51 year old with above-mentioned history of left breast cancer currently on Herceptin maintenance. She will finish Herceptin 02/15/2017. She could not tolerate anastrozole in the switch her to letrozole however she did not fill the letrozole prescription yet. She had multiple issues, but work and could not fill it.  REVIEW OF SYSTEMS:   Constitutional: Denies  fevers, chills or abnormal weight loss Eyes: Denies blurriness of vision Ears, nose, mouth, throat, and face: Denies mucositis or sore throat Respiratory: Denies cough, dyspnea or wheezes Cardiovascular: Denies palpitation, chest discomfort Gastrointestinal:  Denies nausea, heartburn or change in bowel habits Skin: Denies abnormal skin rashes Lymphatics: Denies new lymphadenopathy or easy bruising Neurological:Denies numbness, tingling or new weaknesses Behavioral/Psych: Mood is stable, no new changes  Extremities: No lower extremity edema Breast:  denies any pain or lumps or nodules in either breasts All other systems were reviewed with the patient and are negative.  I have reviewed the past medical history, past surgical history, social history and family history with the patient and they are unchanged from previous note.  ALLERGIES:  is allergic to dilaudid [hydromorphone] and codeine.  MEDICATIONS:  Current Outpatient Prescriptions  Medication Sig Dispense Refill  . acetaminophen (TYLENOL) 500 MG tablet Take 500 mg by mouth every 6 (six) hours as needed. Reported on 02/03/2016    . Alum Hydroxide-Mag Carbonate (GAVISCON PO) Take 2 tablets by mouth daily as needed (for heartburn). Reported on 02/03/2016    . carvedilol (COREG) 3.125 MG tablet Take 1 tablet (3.125 mg total) by mouth 2 (two) times daily. 60 tablet 3  . Ibuprofen-Diphenhydramine Cit (ADVIL PM PO) Take by mouth. Reported on 01/05/2016    . letrozole (FEMARA) 2.5 MG tablet Take 1 tablet (2.5 mg total) by mouth daily. 30 tablet 0  . lidocaine-prilocaine (  EMLA) cream Apply 1 application topically as needed. 30 g 1  . losartan (COZAAR) 25 MG tablet Take 1 tablet (25 mg total) by mouth daily. 30 tablet 6  . ondansetron (ZOFRAN-ODT) 4 MG disintegrating tablet Take 1 tablet (4 mg total) by mouth every 6 (six) hours as needed. Reported on 12/13/2015 20 tablet 5  . permethrin (ELIMITE) 5 % cream Reported on 01/05/2016     No current  facility-administered medications for this visit.     PHYSICAL EXAMINATION: ECOG PERFORMANCE STATUS: 1 - Symptomatic but completely ambulatory  Vitals:   11/23/16 1501  BP: 129/75  Pulse: 80  Resp: 18  Temp: 98.6 F (37 C)   Filed Weights   11/23/16 1501  Weight: 267 lb 14.4 oz (121.5 kg)    GENERAL:alert, no distress and comfortable SKIN: skin color, texture, turgor are normal, no rashes or significant lesions EYES: normal, Conjunctiva are pink and non-injected, sclera clear OROPHARYNX:no exudate, no erythema and lips, buccal mucosa, and tongue normal  NECK: supple, thyroid normal size, non-tender, without nodularity LYMPH:  no palpable lymphadenopathy in the cervical, axillary or inguinal LUNGS: clear to auscultation and percussion with normal breathing effort HEART: regular rate & rhythm and no murmurs and no lower extremity edema ABDOMEN:abdomen soft, non-tender and normal bowel sounds MUSCULOSKELETAL:no cyanosis of digits and no clubbing  NEURO: alert & oriented x 3 with fluent speech, no focal motor/sensory deficits EXTREMITIES: No lower extremity edema  LABORATORY DATA:  I have reviewed the data as listed   Chemistry      Component Value Date/Time   NA 142 11/23/2016 1421   K 4.0 11/23/2016 1421   CL 102 01/03/2016 0855   CO2 30 (H) 11/23/2016 1421   BUN 8.8 11/23/2016 1421   CREATININE 0.8 11/23/2016 1421      Component Value Date/Time   CALCIUM 9.3 11/23/2016 1421   ALKPHOS 92 11/23/2016 1421   AST 32 11/23/2016 1421   ALT 46 11/23/2016 1421   BILITOT 0.45 11/23/2016 1421       Lab Results  Component Value Date   WBC 5.2 11/23/2016   HGB 13.8 11/23/2016   HCT 41.1 11/23/2016   MCV 89.3 11/23/2016   PLT 238 11/23/2016   NEUTROABS 3.2 11/23/2016    ASSESSMENT & PLAN:  Cancer of central portion of left female breast T1 C. N0 M0 stage IA ER 100%, PR 95%, HER-2 negative ratio 1.9, Ki-67 15% with additional satellite nodule 0.6 cm: Oncotype DX  recurrence score is 13 (9% risk of recurrence)  Antiestrogen therapy: Patient took tamoxifen from 05/26/2014 to 06/07/2014 and stopped it because of itching. Patient had undergone oophorectomy with TAH for reduction of risk of ovarian cancer.   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 Adjuvant radiation started 11/24/2015- completed 01/14/2016 2. Herceptin every 3 weeks started 11/08/2015 with anastrozole. Because of decline in the heart ejection fraction, Herceptin was on hold and then resumed.  Herceptin toxicities: 1. First infusion reaction with shaking chills and rigors 2. Decline in the heart ejection fraction:resumedHerceptin. Follows with cardiology closely  Current treatment: 1.Anastrozole restarted 03/13/2016 stopped in January 2018, started letrozole 11/23/2016 2. Herceptin to be completed July 2018 Patient is planning a huge party after July.  Letrozole toxicities: Patient has not started it yet because of multiple issues at work place. She will go today and got the prescription.  Taking it.  RTC in 3 weeks for herceptin and every 6 weeks for follow up with me  I spent 25 minutes talking to the patient of which more than half was spent in counseling and coordination of care.  No orders of the defined types were placed in this encounter.  The patient has a good understanding of the overall plan. she agrees with it. she will call with any problems that may develop before the next visit here.   Rulon Eisenmenger, MD 11/23/16

## 2016-11-23 NOTE — Assessment & Plan Note (Signed)
T1 C. N0 M0 stage IA ER 100%, PR 95%, HER-2 negative ratio 1.9, Ki-67 15% with additional satellite nodule 0.6 cm: Oncotype DX recurrence score is 13 (9% risk of recurrence)  Antiestrogen therapy: Patient took tamoxifen from 05/26/2014 to 06/07/2014 and stopped it because of itching. Patient had undergone oophorectomy with TAH for reduction of risk of ovarian cancer.   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 Adjuvant radiation started 11/24/2015- completed 01/14/2016 2. Herceptin every 3 weeks started 11/08/2015 with anastrozole. Because of decline in the heart ejection fraction, Herceptin was on hold and then resumed.  Herceptin toxicities: 1. First infusion reaction with shaking chills and rigors 2. Decline in the heart ejection fraction:resumedHerceptin. Follows with cardiology closely  Current treatment: 1.Anastrozole restarted 03/13/2016 stopped in January 2018, started letrozole 10/12/2016 2. Herceptin to be completed July 2018  Letrozole toxicities:  RTC in 3 weeks for herceptin and every 6 weeks for follow up with me

## 2016-11-23 NOTE — Patient Instructions (Signed)
[facility] Discharge Instructions for Patients Receiving Chemotherapy  Today you received the following chemotherapy agents:  Herceptin  To help prevent nausea and vomiting after your treatment, we encourage you to take your nausea medication as prescribed.   If you develop nausea and vomiting that is not controlled by your nausea medication, call the clinic.   BELOW ARE SYMPTOMS THAT SHOULD BE REPORTED IMMEDIATELY:  *FEVER GREATER THAN 100.5 F  *CHILLS WITH OR WITHOUT FEVER  NAUSEA AND VOMITING THAT IS NOT CONTROLLED WITH YOUR NAUSEA MEDICATION  *UNUSUAL SHORTNESS OF BREATH  *UNUSUAL BRUISING OR BLEEDING  TENDERNESS IN MOUTH AND THROAT WITH OR WITHOUT PRESENCE OF ULCERS  *URINARY PROBLEMS  *BOWEL PROBLEMS  UNUSUAL RASH Items with * indicate a potential emergency and should be followed up as soon as possible.  Feel free to call the clinic you have any questions or concerns. The clinic phone number is [phone].  Please show the CHEMO ALERT CARD at check-in to the Emergency Department and triage nurse.   

## 2016-12-13 ENCOUNTER — Other Ambulatory Visit: Payer: Commercial Managed Care - PPO

## 2016-12-13 ENCOUNTER — Ambulatory Visit: Payer: Commercial Managed Care - PPO | Admitting: Hematology and Oncology

## 2016-12-13 ENCOUNTER — Ambulatory Visit: Payer: Commercial Managed Care - PPO

## 2016-12-13 ENCOUNTER — Ambulatory Visit (HOSPITAL_BASED_OUTPATIENT_CLINIC_OR_DEPARTMENT_OTHER): Payer: Commercial Managed Care - PPO

## 2016-12-13 VITALS — BP 133/82 | HR 85 | Temp 98.6°F | Resp 16

## 2016-12-13 DIAGNOSIS — C50112 Malignant neoplasm of central portion of left female breast: Secondary | ICD-10-CM | POA: Diagnosis not present

## 2016-12-13 DIAGNOSIS — Z5112 Encounter for antineoplastic immunotherapy: Secondary | ICD-10-CM | POA: Diagnosis not present

## 2016-12-13 MED ORDER — SODIUM CHLORIDE 0.9% FLUSH
10.0000 mL | INTRAVENOUS | Status: DC | PRN
  Administered 2016-12-13: 10 mL
  Filled 2016-12-13: qty 10

## 2016-12-13 MED ORDER — ACETAMINOPHEN 325 MG PO TABS
650.0000 mg | ORAL_TABLET | Freq: Once | ORAL | Status: AC
  Administered 2016-12-13: 650 mg via ORAL

## 2016-12-13 MED ORDER — HEPARIN SOD (PORK) LOCK FLUSH 100 UNIT/ML IV SOLN
500.0000 [IU] | Freq: Once | INTRAVENOUS | Status: AC | PRN
  Administered 2016-12-13: 500 [IU]
  Filled 2016-12-13: qty 5

## 2016-12-13 MED ORDER — TRASTUZUMAB CHEMO 150 MG IV SOLR
6.0000 mg/kg | Freq: Once | INTRAVENOUS | Status: AC
  Administered 2016-12-13: 693 mg via INTRAVENOUS
  Filled 2016-12-13: qty 33

## 2016-12-13 MED ORDER — ACETAMINOPHEN 325 MG PO TABS
ORAL_TABLET | ORAL | Status: AC
  Filled 2016-12-13: qty 2

## 2016-12-13 MED ORDER — DIPHENHYDRAMINE HCL 25 MG PO CAPS
ORAL_CAPSULE | ORAL | Status: AC
  Filled 2016-12-13: qty 2

## 2016-12-13 MED ORDER — DIPHENHYDRAMINE HCL 25 MG PO CAPS
50.0000 mg | ORAL_CAPSULE | Freq: Once | ORAL | Status: AC
  Administered 2016-12-13: 50 mg via ORAL

## 2016-12-13 MED ORDER — SODIUM CHLORIDE 0.9 % IV SOLN
Freq: Once | INTRAVENOUS | Status: AC
  Administered 2016-12-13: 16:00:00 via INTRAVENOUS

## 2016-12-13 NOTE — Patient Instructions (Signed)
[facility] Discharge Instructions for Patients  Today you received the following: Herceptin   To help prevent nausea and vomiting after your treatment, we encourage you to take your nausea medication as directed.   If you develop nausea and vomiting that is not controlled by your nausea medication, call the clinic.   BELOW ARE SYMPTOMS THAT SHOULD BE REPORTED IMMEDIATELY:  *FEVER GREATER THAN 100.5 F  *CHILLS WITH OR WITHOUT FEVER  NAUSEA AND VOMITING THAT IS NOT CONTROLLED WITH YOUR NAUSEA MEDICATION  *UNUSUAL SHORTNESS OF BREATH  *UNUSUAL BRUISING OR BLEEDING  TENDERNESS IN MOUTH AND THROAT WITH OR WITHOUT PRESENCE OF ULCERS  *URINARY PROBLEMS  *BOWEL PROBLEMS  UNUSUAL RASH Items with * indicate a potential emergency and should be followed up as soon as possible.  Feel free to call the clinic you have any questions or concerns. The clinic phone number is [phone].  Please show the CHEMO ALERT CARD at check-in to the Emergency Department and triage nurse.   

## 2017-01-02 ENCOUNTER — Other Ambulatory Visit: Payer: Self-pay

## 2017-01-02 DIAGNOSIS — C50112 Malignant neoplasm of central portion of left female breast: Secondary | ICD-10-CM

## 2017-01-02 DIAGNOSIS — Z170 Estrogen receptor positive status [ER+]: Secondary | ICD-10-CM

## 2017-01-02 NOTE — Assessment & Plan Note (Signed)
T1 C. N0 M0 stage IA ER 100%, PR 95%, HER-2 negative ratio 1.9, Ki-67 15% with additional satellite nodule 0.6 cm: Oncotype DX recurrence score is 13 (9% risk of recurrence)  Antiestrogen therapy: Patient took tamoxifen from 05/26/2014 to 06/07/2014 and stopped it because of itching. Patient had undergone oophorectomy with TAH for reduction of risk of ovarian cancer.   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 Adjuvant radiation started 11/24/2015- completed 01/14/2016 2. Herceptin every 3 weeks started 11/08/2015 with anastrozole. Because of decline in the heart ejection fraction, Herceptin was on hold and then resumed.  Herceptin toxicities: 1. First infusion reaction with shaking chills and rigors 2. Decline in the heart ejection fraction:resumedHerceptin. Follows with cardiology closely  Current treatment: 1.Anastrozole restarted 03/13/2016 stopped in January 2018, started letrozole 11/23/2016 2. Herceptin to be completed July 2018 Patient is planning a huge party after July.  Letrozole toxicities: Patient has not started it yet because of multiple issues at work place. She will go today and got the prescription. Taking it.  RTC in 3 weeks for herceptin and every 6 weeks for follow up with me

## 2017-01-03 ENCOUNTER — Ambulatory Visit (HOSPITAL_BASED_OUTPATIENT_CLINIC_OR_DEPARTMENT_OTHER): Payer: Commercial Managed Care - PPO

## 2017-01-03 ENCOUNTER — Encounter: Payer: Self-pay | Admitting: Hematology and Oncology

## 2017-01-03 ENCOUNTER — Other Ambulatory Visit (HOSPITAL_BASED_OUTPATIENT_CLINIC_OR_DEPARTMENT_OTHER): Payer: Commercial Managed Care - PPO

## 2017-01-03 ENCOUNTER — Ambulatory Visit (HOSPITAL_BASED_OUTPATIENT_CLINIC_OR_DEPARTMENT_OTHER): Payer: Commercial Managed Care - PPO | Admitting: Hematology and Oncology

## 2017-01-03 DIAGNOSIS — M2550 Pain in unspecified joint: Secondary | ICD-10-CM

## 2017-01-03 DIAGNOSIS — R51 Headache: Secondary | ICD-10-CM

## 2017-01-03 DIAGNOSIS — Z5112 Encounter for antineoplastic immunotherapy: Secondary | ICD-10-CM | POA: Diagnosis not present

## 2017-01-03 DIAGNOSIS — C50112 Malignant neoplasm of central portion of left female breast: Secondary | ICD-10-CM

## 2017-01-03 DIAGNOSIS — Z170 Estrogen receptor positive status [ER+]: Secondary | ICD-10-CM

## 2017-01-03 LAB — COMPREHENSIVE METABOLIC PANEL
ALT: 47 U/L (ref 0–55)
AST: 29 U/L (ref 5–34)
Albumin: 4 g/dL (ref 3.5–5.0)
Alkaline Phosphatase: 97 U/L (ref 40–150)
Anion Gap: 10 mEq/L (ref 3–11)
BUN: 10.2 mg/dL (ref 7.0–26.0)
CO2: 28 mEq/L (ref 22–29)
Calcium: 9.5 mg/dL (ref 8.4–10.4)
Chloride: 104 mEq/L (ref 98–109)
Creatinine: 0.8 mg/dL (ref 0.6–1.1)
EGFR: >90 mL/min/{1.73_m2} (ref >90)
Glucose: 100 mg/dl (ref 70–140)
Potassium: 4 mEq/L (ref 3.5–5.1)
Sodium: 141 mEq/L (ref 136–145)
Total Bilirubin: 0.52 mg/dL (ref 0.20–1.20)
Total Protein: 7.6 g/dL (ref 6.4–8.3)

## 2017-01-03 LAB — CBC WITH DIFFERENTIAL/PLATELET
BASO%: 0.4 % (ref 0.0–2.0)
Basophils Absolute: 0 10*3/uL (ref 0.0–0.1)
EOS%: 4.6 % (ref 0.0–7.0)
Eosinophils Absolute: 0.2 10*3/uL (ref 0.0–0.5)
HCT: 42.2 % (ref 34.8–46.6)
HGB: 14.1 g/dL (ref 11.6–15.9)
LYMPH%: 28.7 % (ref 14.0–49.7)
MCH: 30.5 pg (ref 25.1–34.0)
MCHC: 33.4 g/dL (ref 31.5–36.0)
MCV: 91.3 fL (ref 79.5–101.0)
MONO#: 0.2 10*3/uL (ref 0.1–0.9)
MONO%: 4 % (ref 0.0–14.0)
NEUT#: 3.1 10*3/uL (ref 1.5–6.5)
NEUT%: 62.3 % (ref 38.4–76.8)
Platelets: 241 10*3/uL (ref 145–400)
RBC: 4.62 10*6/uL (ref 3.70–5.45)
RDW: 13.4 % (ref 11.2–14.5)
WBC: 5 10*3/uL (ref 3.9–10.3)
lymph#: 1.4 10*3/uL (ref 0.9–3.3)

## 2017-01-03 MED ORDER — ACETAMINOPHEN 325 MG PO TABS
650.0000 mg | ORAL_TABLET | Freq: Once | ORAL | Status: AC
  Administered 2017-01-03: 650 mg via ORAL

## 2017-01-03 MED ORDER — SODIUM CHLORIDE 0.9% FLUSH
10.0000 mL | INTRAVENOUS | Status: DC | PRN
  Administered 2017-01-03: 10 mL
  Filled 2017-01-03: qty 10

## 2017-01-03 MED ORDER — ACETAMINOPHEN 325 MG PO TABS
ORAL_TABLET | ORAL | Status: AC
  Filled 2017-01-03: qty 2

## 2017-01-03 MED ORDER — DIPHENHYDRAMINE HCL 25 MG PO CAPS
ORAL_CAPSULE | ORAL | Status: AC
  Filled 2017-01-03: qty 2

## 2017-01-03 MED ORDER — HEPARIN SOD (PORK) LOCK FLUSH 100 UNIT/ML IV SOLN
500.0000 [IU] | Freq: Once | INTRAVENOUS | Status: AC | PRN
  Administered 2017-01-03: 500 [IU]
  Filled 2017-01-03: qty 5

## 2017-01-03 MED ORDER — SODIUM CHLORIDE 0.9 % IV SOLN
Freq: Once | INTRAVENOUS | Status: AC
  Administered 2017-01-03: 15:00:00 via INTRAVENOUS

## 2017-01-03 MED ORDER — DIPHENHYDRAMINE HCL 25 MG PO CAPS
50.0000 mg | ORAL_CAPSULE | Freq: Once | ORAL | Status: AC
  Administered 2017-01-03: 50 mg via ORAL

## 2017-01-03 MED ORDER — SODIUM CHLORIDE 0.9 % IV SOLN
6.0000 mg/kg | Freq: Once | INTRAVENOUS | Status: AC
  Administered 2017-01-03: 693 mg via INTRAVENOUS
  Filled 2017-01-03: qty 33

## 2017-01-03 NOTE — Progress Notes (Signed)
Patient Care Team: Patient, [name]  DIAGNOSIS:  Encounter Diagnosis  Name Primary?  . Malignant neoplasm of central portion of left breast in female, estrogen receptor positive ([place])     SUMMARY OF ONCOLOGIC HISTORY:   Cancer of central portion of left female breast ([place])   04/16/2014 Surgery    Bilateral mastectomies: Left breast : Multifocal invasive ductal carcinoma positive for lymphovascular invasion, 1.2 cm and 0.6 cm, grade 3, high-grade DCIS with comedonecrosis, 4 SLN negative T1 C. N0 M0 stage IA: Oncotype DX 13 (ROR 9%)      04/16/2014 Surgery    Left upper back melanoma resected no residual melanoma identified on re-resection margins negative      05/26/2014 - 06/08/2015 Anti-estrogen oral therapy    Tamoxifen 20 mg daily with a plan to switch her to aromatase inhibitors once she gets oophorectomy ( patient did not continue antiestrogen therapy by choice)      08/13/2014 Surgery    oophorectomy with total abdominal hysterectomy      09/08/2015 -  Anti-estrogen oral therapy    Resumed anastrozole after she found recurrence of breast cancer; stopped in January 2018, started letrozole 2.5 mg daily 10/12/2016      09/16/2015 Surgery    Skin, left: IDC grade 3; 0.5 cm, margins negative, ER 95%, PR 10%, HER-2 positive ratio 2.08      11/08/2015 -  Chemotherapy    Herceptin every 3 weeks plus anastrozole      11/24/2015 - 01/14/2016 Radiation Therapy    Adjuvant radiation therapy by Dr. Sondra Come       CHIEF COMPLIANT: Follow-up on letrozole  INTERVAL HISTORY: Krystal [name] is a 51 year old with above-mentioned history of bilateral mastectomies for left breast cancer. She also oophorectomy and hysterectomy. She is started on letrozole therapy month ago and is here today to discuss tolerability to treatment. She is also on Herceptin maintenance which will be completed 02/14/2017.  REVIEW OF SYSTEMS:   Constitutional: Denies fevers,  chills or abnormal weight loss Eyes: Denies blurriness of vision Ears, nose, mouth, throat, and face: Denies mucositis or sore throat Respiratory: Denies cough, dyspnea or wheezes Cardiovascular: Denies palpitation, chest discomfort Gastrointestinal:  Denies nausea, heartburn or change in bowel habits Skin: Denies abnormal skin rashes Lymphatics: Denies new lymphadenopathy or easy bruising Neurological:Denies numbness, tingling or new weaknesses Behavioral/Psych: Mood Is stable, no new changes  Extremities: No lower extremity edema Breast:  denies any pain or lumps or nodules in either breasts All other systems were reviewed with the patient and are negative.  I have reviewed the past medical history, past surgical history, social history and family history with the patient and they are unchanged from previous note.  ALLERGIES:  is allergic to dilaudid [hydromorphone] and codeine.  MEDICATIONS:  Current Outpatient Prescriptions  Medication Sig Dispense Refill  . acetaminophen (TYLENOL) 500 MG tablet Take 500 mg by mouth every 6 (six) hours as needed. Reported on 02/03/2016    . Alum Hydroxide-Mag Carbonate (GAVISCON PO) Take 2 tablets by mouth daily as needed (for heartburn). Reported on 02/03/2016    . carvedilol (COREG) 3.125 MG tablet Take 1 tablet (3.125 mg total) by mouth 2 (two) times daily. 60 tablet 3  . Ibuprofen-Diphenhydramine Cit (ADVIL PM PO) Take by mouth. Reported on 01/05/2016    . lidocaine-prilocaine (EMLA) cream Apply 1 application topically as needed. 30 g 1  . losartan (COZAAR) 25 MG tablet Take 1 tablet (25 mg  total) by mouth daily. 30 tablet 6  . ondansetron (ZOFRAN-ODT) 4 MG disintegrating tablet Take 1 tablet (4 mg total) by mouth every 6 (six) hours as needed. Reported on 12/13/2015 20 tablet 5  . permethrin (ELIMITE) 5 % cream Reported on 01/05/2016     No current facility-administered medications for this visit.     PHYSICAL EXAMINATION: ECOG PERFORMANCE  STATUS: 1 - Symptomatic but completely ambulatory  Vitals:   01/03/17 1439  BP: 128/71  Pulse: 72  Resp: 18  Temp: 97.9 F (36.6 C)   Filed Weights   01/03/17 1439  Weight: 266 lb 8 oz (120.9 kg)    GENERAL:alert, no distress and comfortable SKIN: skin color, texture, turgor are normal, no rashes or significant lesions EYES: normal, Conjunctiva are pink and non-injected, sclera clear OROPHARYNX:no exudate, no erythema and lips, buccal mucosa, and tongue normal  NECK: supple, thyroid normal size, non-tender, without nodularity LYMPH:  no palpable lymphadenopathy in the cervical, axillary or inguinal LUNGS: clear to auscultation and percussion with normal breathing effort HEART: regular rate & rhythm and no murmurs and no lower extremity edema ABDOMEN:abdomen soft, non-tender and normal bowel sounds MUSCULOSKELETAL:no cyanosis of digits and no clubbing  NEURO: alert & oriented x 3 with fluent speech, no focal motor/sensory deficits EXTREMITIES: No lower extremity edema BREAST: Right is a 520 wrap 1 is [place] I left it for 4440 08/11/2013 point No palpable masses or nodules  don't enjoy it but it happenedin either right or left breasts. No palpable axillary supraclavicular or infraclavicular adenopathy no breast tenderness or nipple discharge. (exam performed in the presence of a chaperone)  LABORATORY DATA:  I have reviewed the data as listed   Chemistry      Component Value Date/Time   NA 142 11/23/2016 1421   K 4.0 11/23/2016 1421   CL 102 01/03/2016 0855   CO2 30 (H) 11/23/2016 1421   BUN 8.8 11/23/2016 1421   CREATININE 0.8 11/23/2016 1421      Component Value Date/Time   CALCIUM 9.3 11/23/2016 1421   ALKPHOS 92 11/23/2016 1421   AST 32 11/23/2016 1421   ALT 46 11/23/2016 1421   BILITOT 0.45 11/23/2016 1421       Lab Results  Component Value Date   WBC 5.0 01/03/2017   HGB 14.1 01/03/2017   HCT 42.2 01/03/2017   MCV 91.3 01/03/2017   PLT 241 01/03/2017     NEUTROABS 3.1 01/03/2017    ASSESSMENT & PLAN:  Cancer of central portion of left female breast T1 C. N0 M0 stage IA ER 100%, PR 95%, HER-2 negative ratio 1.9, Ki-67 15% with additional satellite nodule 0.6 cm: Oncotype DX recurrence score is 13 (9% risk of recurrence)  Antiestrogen therapy: Patient took tamoxifen from 05/26/2014 to 06/07/2014 and stopped it because of itching. Patient had undergone oophorectomy with TAH for reduction of risk of ovarian cancer.   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 Adjuvant radiation started 11/24/2015- completed 01/14/2016 2. Herceptin every 3 weeks started 11/08/2015 with anastrozole. Because of decline in the heart ejection fraction, Herceptin was on hold and then resumed.  Herceptin toxicities: 1. First infusion reaction with shaking chills and rigors 2. Decline in the heart ejection fraction:resumedHerceptin. Follows with cardiology closely  Current treatment: 1.Anastrozole restarted 03/13/2016 stopped in January 2018, started letrozole 11/23/2016 2. Herceptin to be completed July 2018  Patient is planning a huge party after July.  Letrozole toxicities:  profound headaches and arthralgias. She stopped taking it one week ago. I discussed with her that we could send a prescription for Aromasin. She will contact her insurance to find out the cost of the drug. If it is inexpensive and she will request a prescription for it. If the cost is too much then she will talk to take anastrozole half a tablet daily. We may have to send a new prescription for anastrozole that's what she will do.  I spent 25 minutes talking to the patient of which more than half was spent in counseling and coordination of care.  No orders of the defined types were placed in this encounter.  The patient has a good understanding  of the overall plan. she agrees with it. she will call with any problems that may develop before the next visit here.   Rulon Eisenmenger, MD 01/03/17

## 2017-01-03 NOTE — Patient Instructions (Signed)
[place] Discharge Instructions for Patients  Today you received the following: trastuzumab (Herceptin)   To help prevent nausea and vomiting after your treatment, we encourage you to take your nausea medication as directed.    If you develop nausea and vomiting that is not controlled by your nausea medication, call the clinic.   BELOW ARE SYMPTOMS THAT SHOULD BE REPORTED IMMEDIATELY:  *FEVER GREATER THAN 100.5 F  *CHILLS WITH OR WITHOUT FEVER  NAUSEA AND VOMITING THAT IS NOT CONTROLLED WITH YOUR NAUSEA MEDICATION  *UNUSUAL SHORTNESS OF BREATH  *UNUSUAL BRUISING OR BLEEDING  TENDERNESS IN MOUTH AND THROAT WITH OR WITHOUT PRESENCE OF ULCERS  *URINARY PROBLEMS  *BOWEL PROBLEMS  UNUSUAL RASH Items with * indicate a potential emergency and should be followed up as soon as possible.  Feel free to call the clinic you have any questions or concerns. The clinic phone number is [phone].  Please show the [place] at check-in to the Emergency Department and triage nurse.

## 2017-01-04 ENCOUNTER — Other Ambulatory Visit: Payer: Self-pay

## 2017-01-04 MED ORDER — EXEMESTANE 25 MG PO TABS: 25.0000 mg | ORAL_TABLET | Freq: Every day | ORAL | 0 refills | Status: DC

## 2017-01-04 NOTE — Progress Notes (Signed)
Pt called reporting that her insurance will be covering aromasin medication. Per Dr.Gudena's note, okay to prescribe if approved by insurance. Will escribe medication to [facility] today. Pt aware and thankful for call.Advised pt to keep [place] updated on how patient is doing with aromasin medication before sending refills to her pharmacy. Pt verbalized understanding.

## 2017-01-22 ENCOUNTER — Telehealth: Payer: Self-pay

## 2017-01-22 NOTE — Telephone Encounter (Signed)
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 Gave her information per Dr Lindi Adie OV note 01/03/17

## 2017-01-24 ENCOUNTER — Ambulatory Visit (HOSPITAL_BASED_OUTPATIENT_CLINIC_OR_DEPARTMENT_OTHER): Payer: Commercial Managed Care - PPO

## 2017-01-24 VITALS — BP 131/76 | HR 91 | Temp 98.6°F | Resp 17

## 2017-01-24 DIAGNOSIS — C50112 Malignant neoplasm of central portion of left female breast: Secondary | ICD-10-CM

## 2017-01-24 DIAGNOSIS — Z5112 Encounter for antineoplastic immunotherapy: Secondary | ICD-10-CM

## 2017-01-24 MED ORDER — ACETAMINOPHEN 325 MG PO TABS
650.0000 mg | ORAL_TABLET | Freq: Once | ORAL | Status: AC
  Administered 2017-01-24: 650 mg via ORAL

## 2017-01-24 MED ORDER — SODIUM CHLORIDE 0.9% FLUSH
10.0000 mL | INTRAVENOUS | Status: DC | PRN
  Filled 2017-01-24: qty 10

## 2017-01-24 MED ORDER — HEPARIN SOD (PORK) LOCK FLUSH 100 UNIT/ML IV SOLN
500.0000 [IU] | Freq: Once | INTRAVENOUS | Status: DC | PRN
  Filled 2017-01-24: qty 5

## 2017-01-24 MED ORDER — ACETAMINOPHEN 325 MG PO TABS
ORAL_TABLET | ORAL | Status: AC
  Filled 2017-01-24: qty 2

## 2017-01-24 MED ORDER — DIPHENHYDRAMINE HCL 25 MG PO CAPS
ORAL_CAPSULE | ORAL | Status: AC
  Filled 2017-01-24: qty 1

## 2017-01-24 MED ORDER — SODIUM CHLORIDE 0.9 % IV SOLN
Freq: Once | INTRAVENOUS | Status: AC
  Administered 2017-01-24: 16:00:00 via INTRAVENOUS

## 2017-01-24 MED ORDER — TRASTUZUMAB CHEMO 150 MG IV SOLR
6.0000 mg/kg | Freq: Once | INTRAVENOUS | Status: AC
  Administered 2017-01-24: 693 mg via INTRAVENOUS
  Filled 2017-01-24: qty 33

## 2017-01-24 MED ORDER — DIPHENHYDRAMINE HCL 25 MG PO CAPS
50.0000 mg | ORAL_CAPSULE | Freq: Once | ORAL | Status: AC
  Administered 2017-01-24: 50 mg via ORAL

## 2017-01-24 NOTE — Patient Instructions (Signed)
[facility] Discharge Instructions for Patients Receiving Chemotherapy  Today you received the following chemotherapy agents:  Herceptin  To help prevent nausea and vomiting after your treatment, we encourage you to take your nausea medication as prescribed.   If you develop nausea and vomiting that is not controlled by your nausea medication, call the clinic.   BELOW ARE SYMPTOMS THAT SHOULD BE REPORTED IMMEDIATELY:  *FEVER GREATER THAN 100.5 F  *CHILLS WITH OR WITHOUT FEVER  NAUSEA AND VOMITING THAT IS NOT CONTROLLED WITH YOUR NAUSEA MEDICATION  *UNUSUAL SHORTNESS OF BREATH  *UNUSUAL BRUISING OR BLEEDING  TENDERNESS IN MOUTH AND THROAT WITH OR WITHOUT PRESENCE OF ULCERS  *URINARY PROBLEMS  *BOWEL PROBLEMS  UNUSUAL RASH Items with * indicate a potential emergency and should be followed up as soon as possible.  Feel free to call the clinic you have any questions or concerns. The clinic phone number is [phone].  Please show the CHEMO ALERT CARD at check-in to the Emergency Department and triage nurse.   

## 2017-02-11 ENCOUNTER — Other Ambulatory Visit: Payer: Self-pay | Admitting: Hematology and Oncology

## 2017-02-14 ENCOUNTER — Ambulatory Visit: Payer: Commercial Managed Care - PPO | Admitting: Hematology and Oncology

## 2017-02-14 ENCOUNTER — Ambulatory Visit: Payer: Commercial Managed Care - PPO

## 2017-02-14 ENCOUNTER — Other Ambulatory Visit: Payer: Commercial Managed Care - PPO

## 2017-02-16 ENCOUNTER — Ambulatory Visit (HOSPITAL_BASED_OUTPATIENT_CLINIC_OR_DEPARTMENT_OTHER): Payer: Commercial Managed Care - PPO | Admitting: Hematology and Oncology

## 2017-02-16 ENCOUNTER — Other Ambulatory Visit (HOSPITAL_BASED_OUTPATIENT_CLINIC_OR_DEPARTMENT_OTHER): Payer: Commercial Managed Care - PPO

## 2017-02-16 ENCOUNTER — Ambulatory Visit (HOSPITAL_BASED_OUTPATIENT_CLINIC_OR_DEPARTMENT_OTHER): Payer: Commercial Managed Care - PPO

## 2017-02-16 DIAGNOSIS — C50112 Malignant neoplasm of central portion of left female breast: Secondary | ICD-10-CM

## 2017-02-16 DIAGNOSIS — Z170 Estrogen receptor positive status [ER+]: Secondary | ICD-10-CM

## 2017-02-16 DIAGNOSIS — Z5112 Encounter for antineoplastic immunotherapy: Secondary | ICD-10-CM | POA: Diagnosis not present

## 2017-02-16 DIAGNOSIS — N951 Menopausal and female climacteric states: Secondary | ICD-10-CM

## 2017-02-16 LAB — COMPREHENSIVE METABOLIC PANEL
ALT: 50 U/L (ref 0–55)
AST: 27 U/L (ref 5–34)
Albumin: 3.7 g/dL (ref 3.5–5.0)
Alkaline Phosphatase: 88 U/L (ref 40–150)
Anion Gap: 10 mEq/L (ref 3–11)
BUN: 7.9 mg/dL (ref 7.0–26.0)
CO2: 27 mEq/L (ref 22–29)
Calcium: 9.4 mg/dL (ref 8.4–10.4)
Chloride: 106 mEq/L (ref 98–109)
Creatinine: 0.8 mg/dL (ref 0.6–1.1)
EGFR: 86 mL/min/{1.73_m2} — ABNORMAL LOW (ref >90)
Glucose: 190 mg/dl — ABNORMAL HIGH (ref 70–140)
Potassium: 4.3 mEq/L (ref 3.5–5.1)
Sodium: 142 mEq/L (ref 136–145)
Total Bilirubin: 0.41 mg/dL (ref 0.20–1.20)
Total Protein: 7.4 g/dL (ref 6.4–8.3)

## 2017-02-16 LAB — CBC WITH DIFFERENTIAL/PLATELET
BASO%: 0.9 % (ref 0.0–2.0)
Basophils Absolute: 0 10*3/uL (ref 0.0–0.1)
EOS%: 5.5 % (ref 0.0–7.0)
Eosinophils Absolute: 0.3 10*3/uL (ref 0.0–0.5)
HCT: 41.9 % (ref 34.8–46.6)
HGB: 14.1 g/dL (ref 11.6–15.9)
LYMPH%: 22 % (ref 14.0–49.7)
MCH: 30.5 pg (ref 25.1–34.0)
MCHC: 33.7 g/dL (ref 31.5–36.0)
MCV: 90.6 fL (ref 79.5–101.0)
MONO#: 0.3 10*3/uL (ref 0.1–0.9)
MONO%: 5.8 % (ref 0.0–14.0)
NEUT#: 3.4 10*3/uL (ref 1.5–6.5)
NEUT%: 65.8 % (ref 38.4–76.8)
Platelets: 232 10*3/uL (ref 145–400)
RBC: 4.62 10*6/uL (ref 3.70–5.45)
RDW: 13.6 % (ref 11.2–14.5)
WBC: 5.2 10*3/uL (ref 3.9–10.3)
lymph#: 1.2 10*3/uL (ref 0.9–3.3)

## 2017-02-16 MED ORDER — DIPHENHYDRAMINE HCL 25 MG PO CAPS
ORAL_CAPSULE | ORAL | Status: AC
  Filled 2017-02-16: qty 2

## 2017-02-16 MED ORDER — SODIUM CHLORIDE 0.9% FLUSH
10.0000 mL | INTRAVENOUS | Status: DC | PRN
  Administered 2017-02-16: 10 mL
  Filled 2017-02-16: qty 10

## 2017-02-16 MED ORDER — ACETAMINOPHEN 325 MG PO TABS
ORAL_TABLET | ORAL | Status: AC
  Filled 2017-02-16: qty 2

## 2017-02-16 MED ORDER — SODIUM CHLORIDE 0.9 % IV SOLN
Freq: Once | INTRAVENOUS | Status: AC
  Administered 2017-02-16: 13:00:00 via INTRAVENOUS

## 2017-02-16 MED ORDER — DIPHENHYDRAMINE HCL 25 MG PO CAPS
50.0000 mg | ORAL_CAPSULE | Freq: Once | ORAL | Status: AC
  Administered 2017-02-16: 50 mg via ORAL

## 2017-02-16 MED ORDER — TRASTUZUMAB CHEMO 150 MG IV SOLR
6.0000 mg/kg | Freq: Once | INTRAVENOUS | Status: AC
  Administered 2017-02-16: 693 mg via INTRAVENOUS
  Filled 2017-02-16: qty 33

## 2017-02-16 MED ORDER — ACETAMINOPHEN 325 MG PO TABS
650.0000 mg | ORAL_TABLET | Freq: Once | ORAL | Status: AC
Start: 2017-02-16 — End: 2017-02-16
  Administered 2017-02-16: 650 mg via ORAL

## 2017-02-16 MED ORDER — HEPARIN SOD (PORK) LOCK FLUSH 100 UNIT/ML IV SOLN
500.0000 [IU] | Freq: Once | INTRAVENOUS | Status: AC | PRN
  Administered 2017-02-16: 500 [IU]
  Filled 2017-02-16: qty 5

## 2017-02-16 NOTE — Assessment & Plan Note (Addendum)
T1 C. N0 M0 stage IA ER 100%, PR 95%, HER-2 negative ratio 1.9, Ki-67 15% with additional satellite nodule 0.6 cm: Oncotype DX recurrence score is 13 (9% risk of recurrence)  Antiestrogen therapy: Patient took tamoxifen from 05/26/2014 to 06/07/2014 and stopped it because of itching. Patient had undergone oophorectomy with TAH for reduction of risk of ovarian cancer.   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 Adjuvant radiation started 11/24/2015- completed 01/14/2016 2. Herceptin every 3 weeks started 11/08/2015 with anastrozole. Because of decline in the heart ejection fraction, Herceptin was on hold and then resumed.  Herceptin toxicities: 1. First infusion reaction with shaking chills and rigors 2. Decline in the heart ejection fraction:resumedHerceptin. Follows with cardiology closely  Current treatment: 1.Anastrozole restarted 03/13/2016 stopped in January 2018, started letrozole 11/23/2016 2. Herceptin to be completed July 2018 Patient is planning a huge party after July.  Letrozole toxicities: profound headaches and arthralgias. She stopped taking it  Currently on exemestane 25 mg daily  Return to clinic in 6 months for follow-up

## 2017-02-16 NOTE — Progress Notes (Signed)
Patient Care Team: Patient, [name]  DIAGNOSIS:  Encounter Diagnosis  Name Primary?  . Malignant neoplasm of central portion of left breast in female, estrogen receptor positive ([place])     SUMMARY OF ONCOLOGIC HISTORY:   Cancer of central portion of left female breast ([place])   04/16/2014 Surgery    Bilateral mastectomies: Left breast : Multifocal invasive ductal carcinoma positive for lymphovascular invasion, 1.2 cm and 0.6 cm, grade 3, high-grade DCIS with comedonecrosis, 4 SLN negative T1 C. N0 M0 stage IA: Oncotype DX 13 (ROR 9%)      04/16/2014 Surgery    Left upper back melanoma resected no residual melanoma identified on re-resection margins negative      05/26/2014 - 06/08/2015 Anti-estrogen oral therapy    Tamoxifen 20 mg daily with a plan to switch her to aromatase inhibitors once she gets oophorectomy ( patient did not continue antiestrogen therapy by choice)      08/13/2014 Surgery    oophorectomy with total abdominal hysterectomy      09/08/2015 -  Anti-estrogen oral therapy    Resumed anastrozole after she found recurrence of breast cancer; stopped in January 2018, started letrozole 2.5 mg daily 10/12/2016      09/16/2015 Surgery    Skin, left: IDC grade 3; 0.5 cm, margins negative, ER 95%, PR 10%, HER-2 positive ratio 2.08      11/08/2015 -  Chemotherapy    Herceptin every 3 weeks plus anastrozole      11/24/2015 - 01/14/2016 Radiation Therapy    Adjuvant radiation therapy by Dr. Sondra Come       CHIEF COMPLIANT: Last cycle of Herceptin  INTERVAL HISTORY: Annitta Fifield is a 51 year old with above-mentioned history of left breast cancer who is currently on Herceptin maintenance therapy and today is the last dosage of Herceptin. She is very excited about this. She is also on exemestane. She appears to be tolerating this better than before.  REVIEW OF SYSTEMS:   Constitutional: Denies fevers, chills or abnormal weight loss Eyes:  Denies blurriness of vision Ears, nose, mouth, throat, and face: Denies mucositis or sore throat Respiratory: Denies cough, dyspnea or wheezes Cardiovascular: Denies palpitation, chest discomfort Gastrointestinal:  Denies nausea, heartburn or change in bowel habits Skin: Denies abnormal skin rashes Lymphatics: Denies new lymphadenopathy or easy bruising Neurological:Denies numbness, tingling or new weaknesses Behavioral/Psych: Mood is stable, no new changes  Extremities: No lower extremity edema Breast:  denies any pain or lumps or nodules in either breasts All other systems were reviewed with the patient and are negative.  I have reviewed the past medical history, past surgical history, social history and family history with the patient and they are unchanged from previous note.  ALLERGIES:  is allergic to dilaudid [hydromorphone] and codeine.  MEDICATIONS:  Current Outpatient Prescriptions  Medication Sig Dispense Refill  . acetaminophen (TYLENOL) 500 MG tablet Take 500 mg by mouth every 6 (six) hours as needed. Reported on 02/03/2016    . Alum Hydroxide-Mag Carbonate (GAVISCON PO) Take 2 tablets by mouth daily as needed (for heartburn). Reported on 02/03/2016    . carvedilol (COREG) 3.125 MG tablet Take 1 tablet (3.125 mg total) by mouth 2 (two) times daily. 60 tablet 3  . exemestane (AROMASIN) 25 MG tablet Take 1 tablet (25 mg total) by mouth daily after breakfast. 30 tablet 0  . Ibuprofen-Diphenhydramine Cit (ADVIL PM PO) Take by mouth. Reported on 01/05/2016    . lidocaine-prilocaine (EMLA) cream  Apply 1 application topically as needed. 30 g 1  . losartan (COZAAR) 25 MG tablet Take 1 tablet (25 mg total) by mouth daily. 30 tablet 6  . ondansetron (ZOFRAN-ODT) 4 MG disintegrating tablet Take 1 tablet (4 mg total) by mouth every 6 (six) hours as needed. Reported on 12/13/2015 20 tablet 5  . permethrin (ELIMITE) 5 % cream Reported on 01/05/2016     No current facility-administered  medications for this visit.     PHYSICAL EXAMINATION: ECOG PERFORMANCE STATUS: 0 - Asymptomatic  Vitals:   02/16/17 1152  BP: 119/70  Pulse: 81  Resp: 18  Temp: 98 F (36.7 C)   Filed Weights   02/16/17 1152  Weight: 267 lb 12.8 oz (121.5 kg)    GENERAL:alert, no distress and comfortable SKIN: skin color, texture, turgor are normal, no rashes or significant lesions EYES: normal, Conjunctiva are pink and non-injected, sclera clear OROPHARYNX:no exudate, no erythema and lips, buccal mucosa, and tongue normal  NECK: supple, thyroid normal size, non-tender, without nodularity LYMPH:  no palpable lymphadenopathy in the cervical, axillary or inguinal LUNGS: clear to auscultation and percussion with normal breathing effort HEART: regular rate & rhythm and no murmurs and no lower extremity edema ABDOMEN:abdomen soft, non-tender and normal bowel sounds MUSCULOSKELETAL:no cyanosis of digits and no clubbing  NEURO: alert & oriented x 3 with fluent speech, no focal motor/sensory deficits EXTREMITIES: No lower extremity edema BREAST: No palpable masses or nodules in either right or left breasts. No palpable axillary supraclavicular or infraclavicular adenopathy no breast tenderness or nipple discharge. (exam performed in the presence of a chaperone)  LABORATORY DATA:  I have reviewed the data as listed   Chemistry      Component Value Date/Time   NA 142 02/16/2017 1110   K 4.3 02/16/2017 1110   CL 102 01/03/2016 0855   CO2 27 02/16/2017 1110   BUN 7.9 02/16/2017 1110   CREATININE 0.8 02/16/2017 1110      Component Value Date/Time   CALCIUM 9.4 02/16/2017 1110   ALKPHOS 88 02/16/2017 1110   AST 27 02/16/2017 1110   ALT 50 02/16/2017 1110   BILITOT 0.41 02/16/2017 1110       Lab Results  Component Value Date   WBC 5.2 02/16/2017   HGB 14.1 02/16/2017   HCT 41.9 02/16/2017   MCV 90.6 02/16/2017   PLT 232 02/16/2017   NEUTROABS 3.4 02/16/2017    ASSESSMENT & PLAN:    Cancer of central portion of left female breast T1 C. N0 M0 stage IA ER 100%, PR 95%, HER-2 negative ratio 1.9, Ki-67 15% with additional satellite nodule 0.6 cm: Oncotype DX recurrence score is 13 (9% risk of recurrence)  Antiestrogen therapy: Patient took tamoxifen from 05/26/2014 to 06/07/2014 and stopped it because of itching. Patient had undergone oophorectomy with TAH for reduction of risk of ovarian cancer.   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 Adjuvant radiation started 11/24/2015- completed 01/14/2016 2. Herceptin every 3 weeks started 11/08/2015 with anastrozole. Because of decline in the heart ejection fraction, Herceptin was on hold and then resumed.  Herceptin toxicities: 1. First infusion reaction with shaking chills and rigors 2. Decline in the heart ejection fraction:resumedHerceptin. Follows with cardiology closely  Current treatment: 1.Anastrozole restarted 03/13/2016 stopped in January 2018, started letrozole 11/23/2016, switched to exemestane June 2018 2. Herceptin completed July 13th 2018  Exemestane toxicities:  hot flashes I discussed with her that if the hot flashes get significantly worse we can send a prescription for Effexor. She does not want to take anything at this time. Currently on exemestane 25 mg daily  Return to clinic in 6 months for follow-up   I spent 25 minutes talking to the patient of which more than half was spent in counseling and coordination of care.  No orders of the defined types were placed in this encounter.  The patient has a good understanding of the overall plan. she agrees with it. she will call with any problems that may develop before the next visit here.   Rulon Eisenmenger, MD 02/16/17

## 2017-02-16 NOTE — Patient Instructions (Signed)
[facility] Discharge Instructions for Patients Receiving Chemotherapy  Today you received the following chemotherapy agents: Herceptin   To help prevent nausea and vomiting after your treatment, we encourage you to take your nausea medication as directed.    If you develop nausea and vomiting that is not controlled by your nausea medication, call the clinic.   BELOW ARE SYMPTOMS THAT SHOULD BE REPORTED IMMEDIATELY:  *FEVER GREATER THAN 100.5 F  *CHILLS WITH OR WITHOUT FEVER  NAUSEA AND VOMITING THAT IS NOT CONTROLLED WITH YOUR NAUSEA MEDICATION  *UNUSUAL SHORTNESS OF BREATH  *UNUSUAL BRUISING OR BLEEDING  TENDERNESS IN MOUTH AND THROAT WITH OR WITHOUT PRESENCE OF ULCERS  *URINARY PROBLEMS  *BOWEL PROBLEMS  UNUSUAL RASH Items with * indicate a potential emergency and should be followed up as soon as possible.  Feel free to call the clinic you have any questions or concerns. The clinic phone number is [phone].  Please show the CHEMO ALERT CARD at check-in to the Emergency Department and triage nurse.   

## 2017-02-22 ENCOUNTER — Other Ambulatory Visit: Payer: Self-pay | Admitting: Emergency Medicine

## 2017-02-22 MED ORDER — EXEMESTANE 25 MG PO TABS: 25.0000 mg | ORAL_TABLET | Freq: Every day | ORAL | 0 refills | Status: DC

## 2017-03-05 ENCOUNTER — Encounter (HOSPITAL_COMMUNITY): Payer: Self-pay | Admitting: Internal Medicine

## 2017-03-05 ENCOUNTER — Ambulatory Visit (HOSPITAL_COMMUNITY)
Admission: RE | Admit: 2017-03-05 | Discharge: 2017-03-05 | Disposition: A | Discharge status: Home / Self Care | Payer: Commercial Managed Care - PPO | Source: Ambulatory Visit | Attending: Internal Medicine | Admitting: Internal Medicine

## 2017-03-05 ENCOUNTER — Ambulatory Visit (HOSPITAL_BASED_OUTPATIENT_CLINIC_OR_DEPARTMENT_OTHER)
Admission: RE | Admit: 2017-03-05 | Discharge: 2017-03-05 | Disposition: A | Discharge status: Home / Self Care | Payer: Commercial Managed Care - PPO | Source: Ambulatory Visit | Attending: Internal Medicine | Admitting: Internal Medicine

## 2017-03-05 VITALS — BP 130/88 | HR 87 | Wt 267.8 lb

## 2017-03-05 DIAGNOSIS — I429 Cardiomyopathy, unspecified: Secondary | ICD-10-CM | POA: Insufficient documentation

## 2017-03-05 DIAGNOSIS — Z853 Personal history of malignant neoplasm of breast: Secondary | ICD-10-CM | POA: Insufficient documentation

## 2017-03-05 DIAGNOSIS — I427 Cardiomyopathy due to drug and external agent: Secondary | ICD-10-CM | POA: Diagnosis present

## 2017-03-05 DIAGNOSIS — Z9013 Acquired absence of bilateral breasts and nipples: Secondary | ICD-10-CM | POA: Insufficient documentation

## 2017-03-05 DIAGNOSIS — X58XXXA Exposure to other specified factors, initial encounter: Secondary | ICD-10-CM | POA: Diagnosis not present

## 2017-03-05 DIAGNOSIS — Z87891 Personal history of nicotine dependence: Secondary | ICD-10-CM | POA: Insufficient documentation

## 2017-03-05 DIAGNOSIS — T451X5A Adverse effect of antineoplastic and immunosuppressive drugs, initial encounter: Secondary | ICD-10-CM

## 2017-03-05 DIAGNOSIS — K219 Gastro-esophageal reflux disease without esophagitis: Secondary | ICD-10-CM | POA: Insufficient documentation

## 2017-03-05 DIAGNOSIS — R5383 Other fatigue: Secondary | ICD-10-CM | POA: Diagnosis not present

## 2017-03-05 DIAGNOSIS — C50112 Malignant neoplasm of central portion of left female breast: Secondary | ICD-10-CM

## 2017-03-05 NOTE — Progress Notes (Signed)
  Echocardiogram 2D Echocardiogram has been performed.  Jennette Dubin 03/05/2017, 2:54 PM

## 2017-03-05 NOTE — Progress Notes (Signed)
Patient ID: Krystal [name], female   DOB: June 15, 1966, 51 y.o.   MRN: [id number]     Cardio-oncology Clinic Progress Note  Referring Physician: Lindi [name] Primary Cardiologist: Dr. Haroldine [name]   HPI:  INTERVAL HISTORY: Krystal [name] is a 51 year old woman with a history of left-sided multifocal invasive ductal carcinoma that was noticed on Oncotype and we recommended hormonal therapy but she was noncompliant and did not take any antiestrogen therapy. She went a full year without any treatment and felt a lump in the breast while she was undergoing breast reconstruction and fat grafting. Lap C was then performed which came back as recurrent invasive ductal carcinoma. She underwent resection on 09/16/2015 by Dr. Donne [name] and final pathology revealed that this was an ER/PR and HER-2 positive breast cancer. She is s/p bilateral mastectomies.   Herceptin held in 6/17 for mild toxicity. Herceptin restarted in 8/17.  Radiation therapy 11/23/16-01/13/17.      Cancer of central portion of left female breast (Krystal [name])   04/16/2014 Surgery Bilateral mastectomies: Left breast : Multifocal invasive ductal carcinoma positive for lymphovascular invasion, 1.2 cm and 0.6 cm, grade 3, high-grade DCIS with comedonecrosis, 4 SLN negative T1 C. N0 M0 stage IA: Oncotype DX 13 (ROR 9%)   04/16/2014 Surgery Left upper back melanoma resected no residual melanoma identified on re-resection margins negative   05/26/2014 - 08/03/2015 Anti-estrogen oral therapy Tamoxifen 20 mg daily with a plan to switch her to aromatase inhibitors once she gets oophorectomy ( patient did not continue antiestrogen therapy by choice)   08/13/2014 Surgery oophorectomy with total abdominal hysterectomy   09/08/2015 -  Anti-estrogen oral therapy Resumed anastrozole after she found recurrence of breast cancer   09/16/2015 Surgery Skin, left: IDC grade 3; 0.5 cm, margins negative    11/08/2015 -  Chemotherapy Herceptin every 3 weeks plus  anastrozole              She returns today for cardio-oncology follow up. Overall feeling well. She completed Herceptin on 02/16/17. Denies dyspnea, orthopnea or PND. No LE edema    3/17   Echo 60-65% Lat S' 10.8 GLS -20 02/03/16 Echo 45-50% Lat S' 9.8 GLS -17.1 03/24/16 Echo 55-60%, Grade 1, Lat S' 11.7, GLS -20.3 06/14/16 Echo 55/60% Grade 1 DD lat s' 11.0 GLS -17.8%  09/12/16  Echo 55-60%  Lat s' 11.4 GLS -15.6% (underestimated due to poor endocardial tracking)  11/10/16  Echo 55-60% Lat s' 10.8 GLS -18.3%  03/05/17 Echo 55-60%,  Lat S' 10.9 GLS -17.1 - Personally reviewed   Past Medical History:  Diagnosis Date  . Arthritis    shoulders  . BRCA1 positive    BRCA1 [id number]  . Chest wall mass 09/2015  . Complication of anesthesia    respiratory depression/arrest with Dilaudid  . Cough with sputum 09/10/2015   clear sputum, per pt.  . Dental crowns present   . GERD (gastroesophageal reflux disease)   . History of breast cancer 2015  . PONV (postoperative nausea and vomiting)   . Stuffy and runny nose 09/10/2015   green drainage from nose, per pt.    Current Outpatient Prescriptions  Medication Sig Dispense Refill  . acetaminophen (TYLENOL) 500 MG tablet Take 500 mg by mouth every 6 (six) hours as needed. Reported on 02/03/2016    . Alum Hydroxide-Mag Carbonate (GAVISCON PO) Take 2 tablets by mouth daily as needed (for heartburn). Reported on 02/03/2016    . carvedilol (COREG) 3.125 MG tablet Take 1 tablet (3.125 mg total) by mouth  2 (two) times daily. 60 tablet 3  . exemestane (AROMASIN) 25 MG tablet Take 1 tablet (25 mg total) by mouth daily after breakfast. 30 tablet 0  . Ibuprofen-Diphenhydramine Cit (ADVIL PM PO) Take by mouth. Reported on 01/05/2016    . lidocaine-prilocaine (EMLA) cream Apply 1 application topically as needed. 30 g 1  . losartan (COZAAR) 25 MG tablet Take 1 tablet (25 mg total) by mouth daily. 30 tablet 6  . ondansetron (ZOFRAN-ODT) 4 MG disintegrating  tablet Take 1 tablet (4 mg total) by mouth every 6 (six) hours as needed. Reported on 12/13/2015 20 tablet 5  . permethrin (ELIMITE) 5 % cream Reported on 01/05/2016     No current facility-administered medications for this encounter.     Allergies  Allergen Reactions  . Dilaudid [Hydromorphone] Other (See Comments)    RESPIRATORY DEPRESSION  . Codeine Nausea And Vomiting      Social History   Social History  . Marital status: Widowed    Spouse name: N/A  . Number of children: 3  . Years of education: N/A   Occupational History  . Oncologist   Social History Main Topics  . Smoking status: Former Smoker    Packs/day: 0.00    Years: 0.00    Quit date: 04/17/2014  . Smokeless tobacco: Never Used  . Alcohol use Yes     Comment: occasionally  . Drug use: No  . Sexual activity: Not on file   Other Topics Concern  . Not on file   Social History Narrative  . No narrative on file      Family History  Problem Relation Age of Onset  . Breast cancer Paternal Aunt 20       deceased 25s  . Breast cancer Paternal Grandmother        dx 8s; ? if had 2nd BC in 10s; deceased 42s  . Pancreatic cancer Paternal Uncle 71       smoker; deceased    Vitals:   03/21/2017 1445  BP: 130/88  Pulse: 87  SpO2: 96%  Weight: 267 lb 12.8 oz (121.5 kg)    PHYSICAL EXAM: General:  Well appearing. No resp difficulty HEENT: normal Neck: supple. no JVD. Carotids 2+ bilat; no bruits. No lymphadenopathy or thryomegaly appreciated. R port a cath  Cor: PMI nondisplaced. Regular rate & rhythm. No rubs, gallops or murmurs. Lungs: clear Abdomen: obese, soft, nontender, nondistended. No hepatosplenomegaly. No bruits or masses. Good bowel sounds. Extremities: no cyanosis, clubbing, rash, edema Neuro: alert & orientedx3, cranial nerves grossly intact. moves all 4 extremities w/o difficulty. Affect pleasant    ASSESSMENT & PLAN: 1. Breast CA, recurrent  HER-2-neu+   -  I reviewed echos personally. EF and Doppler parameters stable. No HF on exam. She has completed Herceptin therapy.   - Can f/u PRN    - Can stop carvedilol. Continue Losartan as BP is a bit elevated    2. Former Smoker- quit April 2017 has restarted some  3. Herceptin-induced cardiomyopathy    - Resolved  4. Fatigue    --Suspect OSA. Refused sleep study in past.   Glori Bickers, MD  8:09 PM

## 2017-03-05 NOTE — Patient Instructions (Signed)
Follow up as needed

## 2017-04-04 ENCOUNTER — Other Ambulatory Visit: Payer: Self-pay | Admitting: Radiology

## 2017-04-04 ENCOUNTER — Other Ambulatory Visit: Payer: Self-pay | Admitting: General Surgery

## 2017-04-04 DIAGNOSIS — N6320 Unspecified lump in the left breast, unspecified quadrant: Secondary | ICD-10-CM

## 2017-04-27 ENCOUNTER — Encounter: Payer: Commercial Managed Care - PPO | Admitting: Adult Health

## 2017-04-30 ENCOUNTER — Telehealth: Payer: Self-pay | Admitting: Adult Health

## 2017-04-30 NOTE — Telephone Encounter (Signed)
Spoke with patient and rescheduled appt per 9/24 sch msg

## 2017-05-04 IMAGING — PT NM PET TUM IMG INITIAL (PI) WHOLE BODY
1 of 8 series · 3 of 25 positions shown · non-contrast
Comparison: 08/19/2011 CT abdomen/pelvis.

CLINICAL DATA: Initial treatment strategy for left upper back
melanoma resected 02/17/2014 and left breast multifocal invasive
ductal carcinoma status post bilateral mastectomy on 04/16/2014,
with left chest wall carcinoma recurrence on 09/16/2015.

EXAM:
NUCLEAR MEDICINE PET WHOLE BODY
TECHNIQUE: 12.5 mCi F-18 FDG was injected intravenously. Full-ring PET imaging
was performed from the vertex to the feet after the radiotracer. CT
data was obtained and used for attenuation correction and anatomic
localization.
FASTING BLOOD GLUCOSE:  Value:  127 mg/dl

[Series 4: ct wb 5.0 hd_fov · axial · 5.0mm · 1.11mm/px · z∈[+424,+1304]mm · 3 of 442 slices shown]
[im 111/442  soft-tissue]
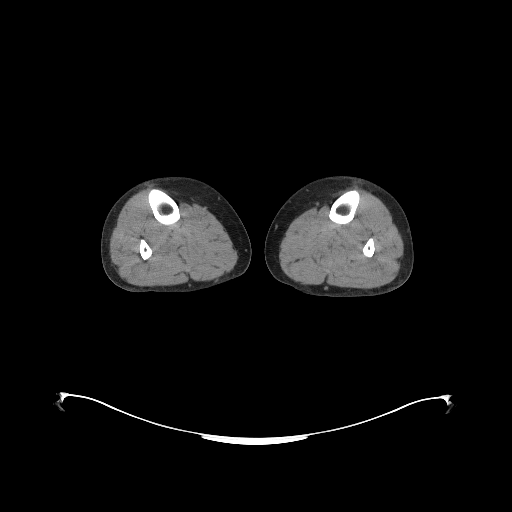
[im 221/442  soft-tissue]
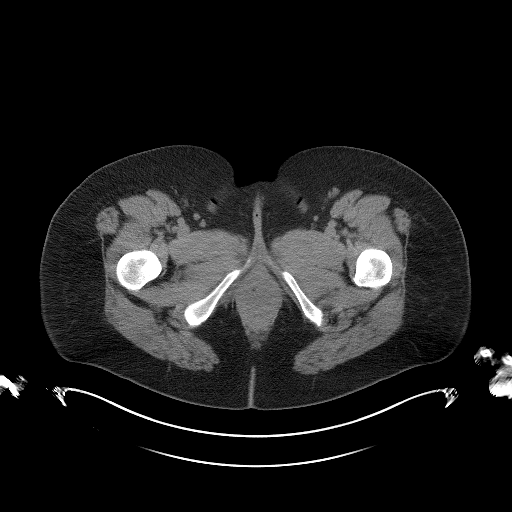
[im 331/442  soft-tissue]
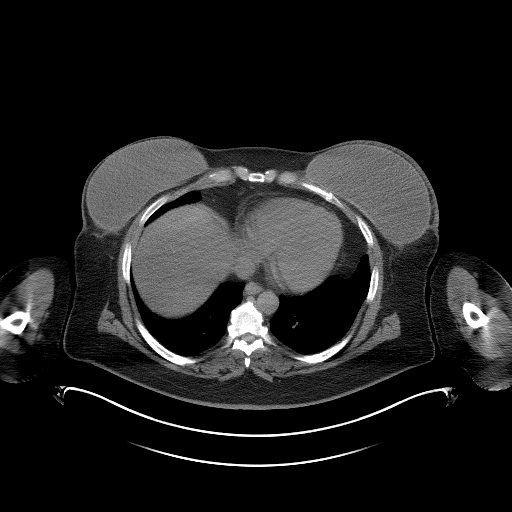

[3 of 25 positions shown; findings below may reference images not displayed]

FINDINGS: Head/Neck: No hypermetabolic lymph nodes in the neck. Symmetric
hypermetabolism in the palatine tonsils and posterior nasopharynx is
likely physiologic.

Chest:

There is relatively symmetric curvilinear low level metabolism
throughout the diffusely mildly thickened skin of the reconstructed
bilateral breasts, max SUV 4.3 on the right and max SUV 3.9 on the
left, most in keeping with low-level inflammatory activity related
to prior reconstruction. Bilateral breast prostheses appear intact.
No focal hypermetabolism in the chest wall.

There is low level linear metabolism within the skin in the medial
left upper back at the site of the melanoma resection scar, in
keeping with low level inflammatory activity. No focal
hypermetabolism to suggest recurrent melanoma.

No hypermetabolic axillary, mediastinal or hilar nodes. No acute
consolidative airspace disease or significant pulmonary nodules.

Abdomen/Pelvis: No abnormal hypermetabolic activity within the
liver, pancreas, adrenal glands, or spleen. No hypermetabolic lymph
nodes in the abdomen or pelvis. Diffuse hepatic steatosis.
Cholecystectomy. Left adrenal 1.2 cm adenoma, non hypermetabolic
with density -5 HU. Mild diffuse colonic diverticulosis.
Hysterectomy.

Skeleton: No focal hypermetabolic activity to suggest skeletal
metastasis.

Extremities: No hypermetabolic activity to suggest metastasis.
IMPRESSION: 1. No evidence of hypermetabolic metastatic disease.
2. Relatively symmetric curvilinear low level metabolism throughout
the diffusely mildly thickened scan of the reconstructed bilateral
breasts, nonspecific and most in keeping with low level inflammatory
activity related to reconstruction.
3. Low level linear metabolism at the resection scar in the medial
left upper back, most in keeping with low level inflammatory
activity related to the scar.
4. Additional findings include diffuse hepatic steatosis and left
adrenal adenoma.

## 2017-05-09 ENCOUNTER — Telehealth: Payer: Self-pay

## 2017-05-09 NOTE — Telephone Encounter (Signed)
Called to remind patient of SCP visit on 05/17/17 at 8:30 am.  She stated that she would here for appt.

## 2017-05-17 ENCOUNTER — Ambulatory Visit (HOSPITAL_BASED_OUTPATIENT_CLINIC_OR_DEPARTMENT_OTHER): Payer: Commercial Managed Care - PPO | Admitting: Adult Health

## 2017-05-17 ENCOUNTER — Telehealth: Payer: Self-pay | Admitting: Adult Health

## 2017-05-17 ENCOUNTER — Encounter: Payer: Self-pay | Admitting: Adult Health

## 2017-05-17 VITALS — BP 116/73 | HR 88 | Temp 98.3°F | Resp 19 | Ht 64.0 in | Wt 263.7 lb

## 2017-05-17 DIAGNOSIS — Z170 Estrogen receptor positive status [ER+]: Secondary | ICD-10-CM

## 2017-05-17 DIAGNOSIS — N951 Menopausal and female climacteric states: Secondary | ICD-10-CM | POA: Diagnosis not present

## 2017-05-17 DIAGNOSIS — C50112 Malignant neoplasm of central portion of left female breast: Secondary | ICD-10-CM | POA: Diagnosis not present

## 2017-05-17 DIAGNOSIS — M2550 Pain in unspecified joint: Secondary | ICD-10-CM | POA: Diagnosis not present

## 2017-05-17 DIAGNOSIS — E2839 Other primary ovarian failure: Secondary | ICD-10-CM

## 2017-05-17 NOTE — Telephone Encounter (Signed)
Called [facility] and got her bone density scheduled. Patient is aware of date

## 2017-05-17 NOTE — Progress Notes (Signed)
CLINIC:  Survivorship   REASON FOR VISIT:  Routine follow-up post-treatment for a recent history of breast cancer.  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 N0 M0 stage IA: Oncotype DX 13 (ROR 9%)      04/16/2014 Surgery    Left upper back melanoma resected no residual melanoma identified on re-resection margins negative      05/26/2014 - 06/08/2015 Anti-estrogen oral therapy    Tamoxifen 20 mg daily with a plan to switch her to aromatase inhibitors once she gets oophorectomy ( patient did not continue antiestrogen therapy by choice)      08/13/2014 Surgery    oophorectomy with total abdominal hysterectomy      09/08/2015 -  Anti-estrogen oral therapy    Resumed anastrozole after she found recurrence of breast cancer; stopped in January 2018, started letrozole 2.5 mg daily 10/12/2016; switched to Exemestane 25 mg daily on 01/03/17      09/16/2015 Surgery    Skin, left: IDC grade 3; 0.5 cm, margins negative, ER 95%, PR 10%, HER-2 positive ratio 2.08      11/08/2015 - 02/2017 Chemotherapy    Herceptin every 3 weeks plus anastrozole      11/24/2015 - 01/14/2016 Radiation Therapy    Adjuvant radiation therapy by Dr. Sondra Come      01/2017 -  Anti-estrogen oral therapy    Exemestane daily (stopped others due to side effects)       INTERVAL HISTORY:  Krystal [name] presents to the [facility] today for our initial meeting to review her survivorship care plan detailing her treatment course for breast cancer, as well as monitoring long-term side effects of that treatment, education regarding health maintenance, screening, and overall wellness and health promotion.     Overall, Krystal [name] reports doing well.  She was previously  taking Tamoxifen, however switched to Anastrozole around April 2017 then Exemestane around June, 2018.  She is doing much better with the Exemestane.  She does have some joint aches, these are about the same as when she was on the Anastrozole.  She does continue to have hot flashes.  She did have a "bout" of headaches that were persistent times one week.  Assoc. Symptoms with these included blurred vision and photophobia.  No nausea with this.  This was about 6 weeks ago.  She does note some new stress at work.      REVIEW OF SYSTEMS:  Review of Systems  Constitutional: Negative for appetite change, chills, fatigue, fever and unexpected weight change.  HENT:   Negative for hearing loss and lump/mass.   Eyes: Negative for eye problems and icterus.  Respiratory: Negative for chest tightness, cough and shortness of breath.   Cardiovascular: Negative for chest pain and leg swelling.  Gastrointestinal: Negative for abdominal distention, abdominal pain, constipation, diarrhea, nausea and vomiting.  Endocrine: Negative for hot flashes.  Genitourinary: Negative for difficulty urinating.   Musculoskeletal: Negative for arthralgias.  Skin: Negative for itching and rash.  Neurological: Negative for dizziness, extremity weakness and headaches.  Hematological: Negative for adenopathy. Does not bruise/bleed easily.  Psychiatric/Behavioral: Negative for depression. The patient is not nervous/anxious.   Breast: Denies any new nodularity, masses, tenderness, nipple changes, or nipple discharge.  ONCOLOGY TREATMENT TEAM:  1. Surgeon:  Dr. Donne Hazel at [facility] 2. Medical Oncologist: Dr. Lindi Adie  3. Radiation Oncologist: Dr. Sondra Come    PAST MEDICAL/SURGICAL HISTORY:  Past Medical History:  Diagnosis Date  . Arthritis    shoulders  . BRCA1 positive    BRCA1 [id number]  . Chest wall mass 09/2015  . Complication of anesthesia    respiratory depression/arrest with Dilaudid  .  Cough with sputum 09/10/2015   clear sputum, per pt.  . Dental crowns present   . GERD (gastroesophageal reflux disease)   . History of breast cancer 2015  . PONV (postoperative nausea and vomiting)   . Stuffy and runny nose 09/10/2015   green drainage from nose, per pt.   Past Surgical History:  Procedure Laterality Date  . ABDOMINAL HYSTERECTOMY    . BREAST LUMPECTOMY Left 09/16/2015   Procedure: LEFT BREAST MASS EXCISION;  Surgeon: Rolm Bookbinder, MD;  Location: [place];  Service: General;  Laterality: Left;  . BREAST RECONSTRUCTION WITH PLACEMENT OF TISSUE EXPANDER AND FLEX HD (ACELLULAR HYDRATED DERMIS) Bilateral 04/16/2014   Procedure: BILATERAL BREAST RECONSTRUCTION WITH PLACEMENT OF TISSUE EXPANDER AND FLEX HD (ACELLULAR HYDRATED DERMIS);  Surgeon: Irene Limbo, MD;  Location: [place];  Service: Plastics;  Laterality: Bilateral;  . CESAREAN SECTION  1999; 2001; 2005  . CHOLECYSTECTOMY  1995  . COLONOSCOPY    . DEBRIDEMENT AND CLOSURE WOUND Bilateral 05/15/2014   Procedure: DEBRIDEMENT AND CLOSURE OF BILATERAL MASECTOMY INCISIONS WITH BREAST EXPANSION;  Surgeon: Irene Limbo, MD;  Location: [place];  Service: Plastics;  Laterality: Bilateral;  . LAPAROSCOPIC ASSISTED VAGINAL HYSTERECTOMY N/A 08/13/2014   Procedure: OPEN LAPAROSCOPY, EXPLORATORY LAPAROTOMY, TOTAL ABDOMINAL HYSTERECTOMY WITH BILATERAL SALPINGECTOMY AND BILATERAL OOPHORECTOMY;  Surgeon: Darlyn Chamber, MD;  [place];  Service: Gynecology;  Laterality: N/A;  . LIPOSUCTION WITH LIPOFILLING Bilateral 11/13/2014   Procedure: LIPOFILLING TO BILATERAL CHEST ;  Surgeon: Irene Limbo, MD;  Location: [place];  Service: Plastics;  Laterality: Bilateral;  . MELANOMA EXCISION N/A 04/16/2014   Procedure: WIDE LOCAL EXCISION OF BACK MELANOMA;  Surgeon: Rolm Bookbinder, MD;  Location: [place];  Service: General;   Laterality: N/A;  . PORTACATH PLACEMENT Right 10/21/2015   Procedure: INSERTION PORT-A-CATH WITH ULTRASOUND ;  Surgeon: Rolm Bookbinder, MD;  Location: [place];  Service: General;  Laterality: Right;  . REMOVAL OF BILATERAL TISSUE EXPANDERS WITH PLACEMENT OF BILATERAL BREAST IMPLANTS Bilateral 11/13/2014   Procedure: REMOVAL OF BILATERAL TISSUE EXPANDERS WITH PLACEMENT OF BILATERAL SILICONE IMPLANTS ;  Surgeon: Irene Limbo, MD;  Location: [place];  Service: Plastics;  Laterality: Bilateral;  . SIMPLE MASTECTOMY WITH AXILLARY SENTINEL NODE BIOPSY Bilateral 04/16/2014   Procedure: BILATERAL SKIN SPARING  MASTECTOMIES WITH LEFT AXILLARY SENTINEL NODE BIOPSY;  Surgeon: Rolm Bookbinder, MD;  Location: [place];  Service: General;  Laterality: Bilateral;     ALLERGIES:  Allergies  Allergen Reactions  . Dilaudid [Hydromorphone] Other (See Comments)    RESPIRATORY DEPRESSION  . Codeine Nausea And Vomiting     CURRENT MEDICATIONS:  Outpatient Encounter Prescriptions as of 05/17/2017  Medication Sig  . acetaminophen (TYLENOL) 500 MG tablet Take 500 mg by mouth every 6 (six) hours as needed. Reported on 02/03/2016  . exemestane (AROMASIN) 25 MG tablet Take 1 tablet (25 mg total) by mouth daily after breakfast.  . [DISCONTINUED] Alum Hydroxide-Mag Carbonate (GAVISCON PO) Take 2 tablets  by mouth daily as needed (for heartburn). Reported on 02/03/2016  . [DISCONTINUED] carvedilol (COREG) 3.125 MG tablet Take 1 tablet (3.125 mg total) by mouth 2 (two) times daily. (Patient not taking: Reported on 05/17/2017)  . [DISCONTINUED] Ibuprofen-Diphenhydramine Cit (ADVIL PM PO) Take by mouth. Reported on 01/05/2016  . [DISCONTINUED] lidocaine-prilocaine (EMLA) cream Apply 1 application topically as needed. (Patient not taking: Reported on 05/17/2017)  . [DISCONTINUED] losartan (COZAAR) 25 MG tablet Take 1 tablet (25 mg total) by mouth daily. (Patient not  taking: Reported on 05/17/2017)  . [DISCONTINUED] ondansetron (ZOFRAN-ODT) 4 MG disintegrating tablet Take 1 tablet (4 mg total) by mouth every 6 (six) hours as needed. Reported on 12/13/2015 (Patient not taking: Reported on 05/17/2017)  . [DISCONTINUED] permethrin (ELIMITE) 5 % cream Reported on 01/05/2016   No facility-administered encounter medications on file as of 05/17/2017.      ONCOLOGIC FAMILY HISTORY:  Family History  Problem Relation Age of Onset  . Breast cancer Paternal Aunt 24       deceased 36s  . Breast cancer Paternal Grandmother        dx 51s; ? if had 2nd BC in 16s; deceased 76s  . Pancreatic cancer Paternal Uncle 8       smoker; deceased     GENETIC COUNSELING/TESTING: Done.  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  SOCIAL HISTORY:  Krystal [name] is single and lives with her two children in [place], [place].  She has 3 children, ages 61, 20 and 10.  Krystal [name] is currently working full time at [employer] in [employer] [place].  She denies any current or history of tobacco, or illicit drug use.  She has a drink once every two weeks.     PHYSICAL EXAMINATION:  Vital Signs:   Vitals:   05/17/17 0824  BP: 116/73  Pulse: 88  Resp: 19  Temp: 98.3 F (36.8 C)  SpO2: 95%   Filed Weights   05/17/17 0824  Weight: 263 lb 11.2 oz (119.6 kg)   General: Well-nourished, well-appearing female in no acute distress.  She is unaccompanied today.   HEENT: Head is normocephalic.  Pupils equal and reactive to light. Conjunctivae clear without exudate.  Sclerae anicteric. Oral mucosa is pink, moist.  Oropharynx is pink without lesions or erythema.  Lymph: No cervical, supraclavicular, or infraclavicular lymphadenopathy noted on palpation.  Cardiovascular: Regular rate and rhythm.Marland Kitchen Respiratory: Clear to auscultation bilaterally. Chest expansion symmetric; breathing non-labored.  GI: Abdomen soft and round;  non-tender, non-distended. Bowel sounds normoactive.  GU: Deferred.  Neuro: No focal deficits. Steady gait.  Psych: Mood and affect normal and appropriate for situation.  Extremities: No edema. MSK: No focal spinal tenderness to palpation.  Full range of motion in bilateral upper extremities Skin: Warm and dry.  LABORATORY DATA:  None for this visit.  DIAGNOSTIC IMAGING:  None for this visit.      ASSESSMENT AND PLAN:  Ms.. [name] is a pleasant 51 y.o. 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  She did have skin inovlement of her left breast that was excised in 09/2015 ER+/PR+/HER-2+ treated with a year of Trastuzumab along with continued anti-estrogen therapy.  She presents to the Survivorship Clinic for our initial meeting and routine follow-up post-completion of treatment for breast cancer.    1. Stage IA left breast cancer  with subsequent skin recurrence:  Krystal [name] is continuing to recover from definitive treatment for breast cancer. She will follow-up with her medical oncologist, Dr. Lindi Adie in 08/2017 with history and physical exam per surveillance protocol.  She will continue her anti-estrogen therapy with Exemestane. Thus far, she is tolerating the Exemestane well, with minimal side effects.  Today, a comprehensive survivorship care plan and treatment summary was reviewed with the patient today detailing her breast cancer diagnosis, treatment course, potential late/long-term effects of treatment, appropriate follow-up care with recommendations for the future, and patient education resources.  A copy of this summary, along with a letter will be sent to the patient's primary care provider via mail/fax/In Basket message after today's visit.    2. Bone health:  Given Krystal [name]'s age/history of breast cancer and her current  treatment regimen including anti-estrogen therapy with Exemestane, she is at risk for bone demineralization.  She has not yet had a bone density test performed, therefore, I ordered one today.   In the meantime, she was encouraged to increase her consumption of foods rich in calcium, as well as increase her weight-bearing activities.  She was given education on specific activities to promote bone health.  3. Cancer screening:  Due to Krystal [name]'s history and her age, she should receive screening for skin cancers, colon cancer, and gynecologic cancers.  The information and recommendations are listed on the patient's comprehensive care plan/treatment summary and were reviewed in detail with the patient.    4. Health maintenance and wellness promotion: Krystal [name] was encouraged to consume 5-7 servings of fruits and vegetables per day. We reviewed the "Nutrition Rainbow" handout, as well as the handout "Take Control of Your Health and Reduce Your Cancer Risk" from the [facility].  She was also encouraged to engage in moderate to vigorous exercise for 30 minutes per day most days of the week. We discussed the [employer] fitness program, which is designed for cancer survivors to help them become more physically fit after cancer treatments.  She was instructed to limit her alcohol consumption and continue to abstain from tobacco use.     5. Support services/counseling: It is not uncommon for this period of the patient's cancer care trajectory to be one of many emotions and stressors.  We discussed an opportunity for her to participate in the next session of [facility] ("Finding Your New Normal") support group series designed for patients after they have completed treatment.   Krystal [name] was encouraged to take advantage of our many other support services programs, support groups, and/or counseling in coping with her new life as a cancer survivor after completing anti-cancer treatment.  She was offered support  today through active listening and expressive supportive counseling.  She was given information regarding our available services and encouraged to contact me with any questions or for help enrolling in any of our support group/programs.   6. Port: She has kept her port in since finishing Herceptin.  She can have it removed and I reviewed this with her today.  She declined letting us flush it today.  She says she will contact Dr. Donne Hazel to have it removed.     Dispo:   -Return to cancer center 08/2017 -Follow up with surgery when it is time for port to be removed -She is welcome to return back to the Survivorship Clinic at any time; no additional follow-up needed at this time.  -Consider referral back to survivorship as a long-term survivor for continued  surveillance  A total of (30) minutes of face-to-face time was spent with this patient with greater than 50% of that time in counseling and care-coordination.   Gardenia Phlegm, NP Survivorship Program [place] [phone]   Note: PRIMARY CARE PROVIDER Patient, [name] None None

## 2017-05-31 ENCOUNTER — Telehealth: Payer: Self-pay

## 2017-05-31 NOTE — Telephone Encounter (Signed)
Called pt and lvm to let her know that her bone density report shows within normal limits. Reminded her of her follow up appt in January 2019.

## 2017-06-11 ENCOUNTER — Encounter (HOSPITAL_BASED_OUTPATIENT_CLINIC_OR_DEPARTMENT_OTHER): Payer: Self-pay | Admitting: *Deleted

## 2017-06-12 ENCOUNTER — Other Ambulatory Visit: Payer: Self-pay | Admitting: General Surgery

## 2017-06-14 ENCOUNTER — Ambulatory Visit (HOSPITAL_BASED_OUTPATIENT_CLINIC_OR_DEPARTMENT_OTHER)
Admission: RE | Admit: 2017-06-14 | Discharge: 2017-06-14 | Disposition: A | Discharge status: Home / Self Care | Payer: Commercial Managed Care - PPO | Source: Ambulatory Visit | Attending: General Surgery | Admitting: General Surgery

## 2017-06-14 ENCOUNTER — Ambulatory Visit (HOSPITAL_BASED_OUTPATIENT_CLINIC_OR_DEPARTMENT_OTHER): Payer: Commercial Managed Care - PPO | Admitting: Anesthesiology

## 2017-06-14 ENCOUNTER — Encounter (HOSPITAL_BASED_OUTPATIENT_CLINIC_OR_DEPARTMENT_OTHER)
Admission: RE | Disposition: A | Discharge status: Home / Self Care | Payer: Self-pay | Source: Ambulatory Visit | Attending: General Surgery

## 2017-06-14 ENCOUNTER — Encounter (HOSPITAL_BASED_OUTPATIENT_CLINIC_OR_DEPARTMENT_OTHER): Payer: Self-pay | Admitting: Anesthesiology

## 2017-06-14 ENCOUNTER — Other Ambulatory Visit: Payer: Self-pay

## 2017-06-14 DIAGNOSIS — Z90710 Acquired absence of both cervix and uterus: Secondary | ICD-10-CM | POA: Diagnosis not present

## 2017-06-14 DIAGNOSIS — Z853 Personal history of malignant neoplasm of breast: Secondary | ICD-10-CM | POA: Diagnosis not present

## 2017-06-14 DIAGNOSIS — Z79899 Other long term (current) drug therapy: Secondary | ICD-10-CM | POA: Insufficient documentation

## 2017-06-14 DIAGNOSIS — C439 Malignant melanoma of skin, unspecified: Secondary | ICD-10-CM | POA: Diagnosis not present

## 2017-06-14 DIAGNOSIS — Z885 Allergy status to narcotic agent status: Secondary | ICD-10-CM | POA: Diagnosis not present

## 2017-06-14 DIAGNOSIS — Z6841 Body Mass Index (BMI) 40.0 and over, adult: Secondary | ICD-10-CM | POA: Diagnosis not present

## 2017-06-14 DIAGNOSIS — Z452 Encounter for adjustment and management of vascular access device: Secondary | ICD-10-CM | POA: Diagnosis present

## 2017-06-14 DIAGNOSIS — M19012 Primary osteoarthritis, left shoulder: Secondary | ICD-10-CM | POA: Insufficient documentation

## 2017-06-14 DIAGNOSIS — M19011 Primary osteoarthritis, right shoulder: Secondary | ICD-10-CM | POA: Insufficient documentation

## 2017-06-14 DIAGNOSIS — C50912 Malignant neoplasm of unspecified site of left female breast: Secondary | ICD-10-CM | POA: Insufficient documentation

## 2017-06-14 DIAGNOSIS — K219 Gastro-esophageal reflux disease without esophagitis: Secondary | ICD-10-CM | POA: Diagnosis not present

## 2017-06-14 DIAGNOSIS — F17210 Nicotine dependence, cigarettes, uncomplicated: Secondary | ICD-10-CM | POA: Diagnosis not present

## 2017-06-14 HISTORY — PX: PORT-A-CATH REMOVAL: SHX5289

## 2017-06-14 SURGERY — REMOVAL PORT-A-CATH
Anesthesia: Monitor Anesthesia Care | Site: Chest | Laterality: Right

## 2017-06-14 MED ORDER — PROPOFOL 10 MG/ML IV BOLUS
INTRAVENOUS | Status: AC
  Filled 2017-06-14: qty 40

## 2017-06-14 MED ORDER — SCOPOLAMINE 1 MG/3DAYS TD PT72
MEDICATED_PATCH | TRANSDERMAL | Status: AC
  Filled 2017-06-14: qty 1

## 2017-06-14 MED ORDER — MIDAZOLAM HCL 2 MG/2ML IJ SOLN
INTRAMUSCULAR | Status: AC
  Filled 2017-06-14: qty 2

## 2017-06-14 MED ORDER — ONDANSETRON HCL 4 MG/2ML IJ SOLN
INTRAMUSCULAR | Status: DC | PRN
  Administered 2017-06-14: 4 mg via INTRAVENOUS

## 2017-06-14 MED ORDER — ONDANSETRON HCL 4 MG/2ML IJ SOLN
INTRAMUSCULAR | Status: AC
  Filled 2017-06-14: qty 2

## 2017-06-14 MED ORDER — FENTANYL CITRATE (PF) 100 MCG/2ML IJ SOLN
INTRAMUSCULAR | Status: AC
  Filled 2017-06-14: qty 2

## 2017-06-14 MED ORDER — ACETAMINOPHEN 500 MG PO TABS
1000.0000 mg | ORAL_TABLET | ORAL | Status: AC
  Administered 2017-06-14: 1000 mg via ORAL

## 2017-06-14 MED ORDER — DEXAMETHASONE SODIUM PHOSPHATE 10 MG/ML IJ SOLN
INTRAMUSCULAR | Status: AC
  Filled 2017-06-14: qty 1

## 2017-06-14 MED ORDER — SCOPOLAMINE 1 MG/3DAYS TD PT72
1.0000 | MEDICATED_PATCH | Freq: Once | TRANSDERMAL | Status: DC | PRN
  Administered 2017-06-14: 1.5 mg via TRANSDERMAL

## 2017-06-14 MED ORDER — GABAPENTIN 300 MG PO CAPS
300.0000 mg | ORAL_CAPSULE | ORAL | Status: AC
  Administered 2017-06-14: 300 mg via ORAL

## 2017-06-14 MED ORDER — LIDOCAINE-EPINEPHRINE (PF) 1 %-1:200000 IJ SOLN
INTRAMUSCULAR | Status: AC
  Filled 2017-06-14: qty 30

## 2017-06-14 MED ORDER — ACETAMINOPHEN 500 MG PO TABS
ORAL_TABLET | ORAL | Status: AC
  Filled 2017-06-14: qty 2

## 2017-06-14 MED ORDER — LACTATED RINGERS IV SOLN
INTRAVENOUS | Status: DC
  Administered 2017-06-14: 10 mL/h via INTRAVENOUS

## 2017-06-14 MED ORDER — CELECOXIB 200 MG PO CAPS
ORAL_CAPSULE | ORAL | Status: AC
  Filled 2017-06-14: qty 1

## 2017-06-14 MED ORDER — DEXAMETHASONE SODIUM PHOSPHATE 10 MG/ML IJ SOLN
INTRAMUSCULAR | Status: DC | PRN
  Administered 2017-06-14: 4 mg via INTRAVENOUS

## 2017-06-14 MED ORDER — FENTANYL CITRATE (PF) 100 MCG/2ML IJ SOLN
50.0000 ug | INTRAMUSCULAR | Status: DC | PRN
  Administered 2017-06-14: 100 ug via INTRAVENOUS

## 2017-06-14 MED ORDER — PROPOFOL 500 MG/50ML IV EMUL
INTRAVENOUS | Status: DC | PRN
  Administered 2017-06-14: 200 ug/kg/min via INTRAVENOUS

## 2017-06-14 MED ORDER — GABAPENTIN 300 MG PO CAPS
ORAL_CAPSULE | ORAL | Status: AC
  Filled 2017-06-14: qty 1

## 2017-06-14 MED ORDER — LIDOCAINE HCL (CARDIAC) 20 MG/ML IV SOLN
INTRAVENOUS | Status: DC | PRN
  Administered 2017-06-14: 60 mg via INTRAVENOUS

## 2017-06-14 MED ORDER — CELECOXIB 200 MG PO CAPS
200.0000 mg | ORAL_CAPSULE | ORAL | Status: AC
  Administered 2017-06-14: 200 mg via ORAL

## 2017-06-14 MED ORDER — LIDOCAINE-EPINEPHRINE (PF) 1 %-1:200000 IJ SOLN
INTRAMUSCULAR | Status: DC | PRN
  Administered 2017-06-14: 9 mL via SUBCUTANEOUS

## 2017-06-14 MED ORDER — MIDAZOLAM HCL 2 MG/2ML IJ SOLN
1.0000 mg | INTRAMUSCULAR | Status: DC | PRN
  Administered 2017-06-14: 2 mg via INTRAVENOUS

## 2017-06-14 SURGICAL SUPPLY — 34 items
ADH SKN CLS APL DERMABOND .7 (GAUZE/BANDAGES/DRESSINGS) ×1
BLADE SURG 15 STRL LF DISP TIS (BLADE) ×1 IMPLANT
BLADE SURG 15 STRL SS (BLADE) ×3
CHLORAPREP W/TINT 26ML (MISCELLANEOUS) ×3 IMPLANT
COVER BACK TABLE 60X90IN (DRAPES) ×3 IMPLANT
COVER MAYO STAND STRL (DRAPES) ×3 IMPLANT
DECANTER SPIKE VIAL GLASS SM (MISCELLANEOUS) ×3 IMPLANT
DERMABOND ADVANCED (GAUZE/BANDAGES/DRESSINGS) ×2
DERMABOND ADVANCED .7 DNX12 (GAUZE/BANDAGES/DRESSINGS) ×1 IMPLANT
DRAPE LAPAROTOMY 100X72 PEDS (DRAPES) ×3 IMPLANT
DRAPE UTILITY XL STRL (DRAPES) ×3 IMPLANT
ELECT COATED BLADE 2.86 ST (ELECTRODE) ×3 IMPLANT
ELECT REM PT RETURN 9FT ADLT (ELECTROSURGICAL) ×3
ELECTRODE REM PT RTRN 9FT ADLT (ELECTROSURGICAL) ×1 IMPLANT
GLOVE BIO SURGEON STRL SZ7 (GLOVE) ×3 IMPLANT
GLOVE BIOGEL M STRL SZ7.5 (GLOVE) ×2 IMPLANT
GLOVE BIOGEL PI IND STRL 7.5 (GLOVE) ×1 IMPLANT
GLOVE BIOGEL PI IND STRL 8 (GLOVE) IMPLANT
GLOVE BIOGEL PI INDICATOR 7.5 (GLOVE) ×2
GLOVE BIOGEL PI INDICATOR 8 (GLOVE) ×2
GOWN STRL REUS W/ TWL LRG LVL3 (GOWN DISPOSABLE) ×2 IMPLANT
GOWN STRL REUS W/ TWL XL LVL3 (GOWN DISPOSABLE) IMPLANT
GOWN STRL REUS W/TWL LRG LVL3 (GOWN DISPOSABLE) ×3
GOWN STRL REUS W/TWL XL LVL3 (GOWN DISPOSABLE) ×3
NDL HYPO 25X1 1.5 SAFETY (NEEDLE) ×1 IMPLANT
NEEDLE HYPO 25X1 1.5 SAFETY (NEEDLE) ×3 IMPLANT
PACK BASIN DAY SURGERY FS (CUSTOM PROCEDURE TRAY) ×3 IMPLANT
PENCIL BUTTON HOLSTER BLD 10FT (ELECTRODE) ×3 IMPLANT
SLEEVE SCD COMPRESS KNEE MED (MISCELLANEOUS) IMPLANT
SUT MNCRL AB 4-0 PS2 18 (SUTURE) ×3 IMPLANT
SUT VIC AB 3-0 SH 27 (SUTURE) ×3
SUT VIC AB 3-0 SH 27X BRD (SUTURE) ×1 IMPLANT
SYR CONTROL 10ML LL (SYRINGE) ×3 IMPLANT
TOWEL OR NON WOVEN STRL DISP B (DISPOSABLE) ×3 IMPLANT

## 2017-06-14 NOTE — Transfer of Care (Signed)
Immediate Anesthesia Transfer of Care Note  Patient: Krystal [name]  Procedure(s) Performed: REMOVAL PORT-A-CATH (Right Chest)  Patient Location: PACU  Anesthesia Type:General  Level of Consciousness: awake and sedated  Airway & Oxygen Therapy: Patient Spontanous Breathing  Post-op Assessment: Report given to RN and Post -op Vital signs reviewed and stable  Post vital signs: Reviewed and stable  Last Vitals:  Vitals:   06/14/17 1509  BP: (!) 149/78  Pulse: 90  Resp: 18  Temp: 36.8 C  SpO2: 97%    Last Pain:  Vitals:   06/14/17 1509  TempSrc: Oral         Complications: No apparent anesthesia complications

## 2017-06-14 NOTE — H&P (Signed)
Krystal [name] is an 51 y.o. female.   Chief Complaint: no longer needs port HPI: 15 yof who I know well from prior breast cancer with recurrence and a melanoma.  She is doing well overall and would like her port out now.   Past Medical History:  Diagnosis Date  . Arthritis    shoulders  . BRCA1 positive    BRCA1 [id number]  . Chest wall mass 09/2015  . Complication of anesthesia    respiratory depression/arrest with Dilaudid  . Cough with sputum 09/10/2015   clear sputum, per pt.  . Dental crowns present   . GERD (gastroesophageal reflux disease)    takes OTC meds  . History of breast cancer 2015  . Stuffy and runny nose 09/10/2015   green drainage from nose, per pt.    Past Surgical History:  Procedure Laterality Date  . ABDOMINAL HYSTERECTOMY    . CESAREAN SECTION  1999; 2001; 2005  . CHOLECYSTECTOMY  1995  . COLONOSCOPY      Family History  Problem Relation Age of Onset  . Breast cancer Paternal Aunt 84       deceased 51s  . Breast cancer Paternal Grandmother        dx 48s; ? if had 2nd BC in 35s; deceased 84s  . Pancreatic cancer Paternal Uncle 35       smoker; deceased   Social History:  reports that she has been smoking.  She has been smoking about 0.00 packs per day for the past 0.00 years. she has never used smokeless tobacco. She reports that she drinks alcohol. She reports that she does not use drugs.  Allergies:  Allergies  Allergen Reactions  . Dilaudid [Hydromorphone] Other (See Comments)    RESPIRATORY DEPRESSION  . Codeine Nausea And Vomiting    Medications Prior to Admission  Medication Sig Dispense Refill  . acetaminophen (TYLENOL) 500 MG tablet Take 500 mg by mouth every 6 (six) hours as needed. Reported on 02/03/2016    . exemestane (AROMASIN) 25 MG tablet Take 1 tablet (25 mg total) by mouth daily after breakfast. 30 tablet 0    No results found for this or any previous visit (from the past 48 hour(s)). No results  found.  ROS Negative  Height 5' 4"  (1.626 m), weight 117 kg (258 lb), last menstrual period 07/06/2014. Physical Exam  Right chest wall with port in place to right IJ cv rrr Lungs clear  Assessment/Plan Port removal  No longer needs venous access. Will remove port under local with sedation.   Rolm Bookbinder, MD 06/14/2017, 3:06 PM

## 2017-06-14 NOTE — Anesthesia Postprocedure Evaluation (Signed)
Anesthesia Post Note  Patient: Krystal [name]  Procedure(s) Performed: REMOVAL PORT-A-CATH (Right Chest)     Patient location during evaluation: PACU Anesthesia Type: MAC Level of consciousness: awake and alert Pain management: pain level controlled Vital Signs Assessment: post-procedure vital signs reviewed and stable Respiratory status: spontaneous breathing, nonlabored ventilation and respiratory function stable Cardiovascular status: stable and blood pressure returned to baseline Postop Assessment: no apparent nausea or vomiting Anesthetic complications: no    Last Vitals:  Vitals:   06/14/17 1615 06/14/17 1630  BP: 111/74 127/84  Pulse: 84   Resp: 14   Temp:    SpO2: 93%     Last Pain:  Vitals:   06/14/17 1615  TempSrc:   PainSc: 2                  Catalina Gravel

## 2017-06-14 NOTE — Anesthesia Preprocedure Evaluation (Signed)
Anesthesia Evaluation  Patient identified by MRN, date of birth, ID band Patient awake    Reviewed: Allergy & Precautions, NPO status , Patient's Chart, lab work & pertinent test results  Airway Mallampati: II  TM Distance: >3 FB Neck ROM: Full    Dental  (+) Teeth Intact, Dental Advisory Given, Caps   Pulmonary Current Smoker,    Pulmonary exam normal breath sounds clear to auscultation       Cardiovascular negative cardio ROS Normal cardiovascular exam Rhythm:Regular Rate:Normal     Neuro/Psych negative neurological ROS  negative psych ROS   GI/Hepatic Neg liver ROS, GERD  Medicated,  Endo/Other  negative endocrine ROSMorbid obesity  Renal/GU negative Renal ROS     Musculoskeletal  (+) Arthritis , Osteoarthritis,    Abdominal   Peds  Hematology negative hematology ROS (+)   Anesthesia Other Findings Day of surgery medications reviewed with the patient.  History of breast cancer  Reproductive/Obstetrics                             Anesthesia Physical Anesthesia Plan  ASA: III  Anesthesia Plan: MAC   Post-op Pain Management:    Induction: Intravenous  PONV Risk Score and Plan: 1 and Propofol infusion and Midazolam  Airway Management Planned: Nasal Cannula  Additional Equipment:   Intra-op Plan:   Post-operative Plan:   Informed Consent: I have reviewed the patients History and Physical, chart, labs and discussed the procedure including the risks, benefits and alternatives for the proposed anesthesia with the patient or authorized representative who has indicated his/her understanding and acceptance.   Dental advisory given  Plan Discussed with: CRNA and Anesthesiologist  Anesthesia Plan Comments: (Discussed risks/benefits/alternatives to MAC sedation including need for ventilatory support, hypotension, need for conversion to general anesthesia.  All patient questions  answered.  Patient/guardian wishes to proceed.)        Anesthesia Quick Evaluation

## 2017-06-14 NOTE — Op Note (Signed)
Preoperative diagnosis: Breast cancer no longer needs venous access Postoperative diagnosis: Same as above Procedure: Port-A-Cath removal Surgeon: Dr. Serita Grammes Anesthesia: Local with sedation Complications: None Drains: None Specimens: None Estimated blood loss: Minimal Sponge needle count correct at completion Disposition to recovery in stable condition  Indications: There is a 51 year old female I know well from her treatment for breast cancer as well as a recurrence and melanoma.  She has completed her treatment and would like to have her port removed.  I discussed port removal under local anesthesia.  She also desires sedation.  Procedure: After informed consent was obtained the patient was taken to the operating room.  She was placed under sedation.  She was prepped and draped in the standard sterile surgical fashion.  A surgical timeout was then performed.  I infiltrated a mixture of Marcaine and lidocaine in the prior incision.  I then made an incision.  I then removed the port, suture material, and the line in their entirety.  Hemostasis was then obtained.  I then closed this with 3-0 Vicryl 4-0 Monocryl.  Dermabond was placed over this.  She tolerated this well was transferred to recovery stable.

## 2017-06-14 NOTE — Discharge Instructions (Signed)
PORT-A-CATH: POST OP INSTRUCTIONS  Always review your discharge instruction sheet given to you by the facility where your surgery was performed.   1. A prescription for pain medication may be given to you upon discharge. Take your pain medication as prescribed, if needed. If narcotic pain medicine is not needed, then you make take acetaminophen (Tylenol) or ibuprofen (Advil) as needed.  2. Take your usually prescribed medications unless otherwise directed. 3. If you need a refill on your pain medication, please contact our office. All narcotic pain medicine now requires a paper prescription.  Phoned in and fax refills are no longer allowed by law.  Prescriptions will not be filled after 5 pm or on weekends.  4. You should follow a light diet for the remainder of the day after your procedure. 5. Most patients will experience some mild swelling and/or bruising in the area of the incision. It may take several days to resolve. 6. It is common to experience some constipation if taking pain medication after surgery. Increasing fluid intake and taking a stool softener (such as Colace) will usually help or prevent this problem from occurring. A mild laxative (Milk of Magnesia or Miralax) should be taken according to package directions if there are no bowel movements after 48 hours.  7. Unless discharge instructions indicate otherwise, you may remove your bandages 48 hours after surgery, and you may shower at that time. You may have steri-strips (small white skin tapes) in place directly over the incision.  These strips should be left on the skin for 7-10 days.  If your surgeon used Dermabond (skin glue) on the incision, you may shower in 24 hours.  The glue will flake off over the next 2-3 weeks.  8. ACTIVITIES:  Limit activity involving your arms for the next 72 hours. Do no strenuous exercise or activity for 1 week. You may drive when you are no longer taking prescription pain medication, you can  comfortably wear a seatbelt, and you can maneuver your car. 10.You may need to see your doctor in the office for a follow-up appointment.  Please       check with your doctor.    WHEN TO CALL YOUR DOCTOR [phone]): 1. Fever over 101.0 2. Chills 3. Continued bleeding from incision 4. Increased redness and tenderness at the site 5. Shortness of breath, difficulty breathing   The clinic staff is available to answer your questions during regular business hours. Please dont hesitate to call and ask to speak to one of the nurses or medical assistants for clinical concerns. If you have a medical emergency, go to the nearest emergency room or call 911.  A surgeon from [facility] is always on call at the hospital.     For further information, please visit [email or web address]     Post Anesthesia Home Care Instructions  Activity: Get plenty of rest for the remainder of the day. A responsible individual must stay with you for 24 hours following the procedure.  For the next 24 hours, DO NOT: -Drive a car -Paediatric nurse -Drink alcoholic beverages -Take any medication unless instructed by your physician -Make any legal decisions or sign important papers.  Meals: Start with liquid foods such as gelatin or soup. Progress to regular foods as tolerated. Avoid greasy, spicy, heavy foods. If nausea and/or vomiting occur, drink only clear liquids until the nausea and/or vomiting subsides. Call your physician if vomiting continues.  Special Instructions/Symptoms: Your throat may feel dry or sore from the  anesthesia or the breathing tube placed in your throat during surgery. If this causes discomfort, gargle with warm salt water. The discomfort should disappear within 24 hours.  If you had a scopolamine patch placed behind your ear for the management of post- operative nausea and/or vomiting:  1. The medication in the patch is effective for 72 hours, after which it  should be removed.  Wrap patch in a tissue and discard in the trash. Wash hands thoroughly with soap and water. 2. You may remove the patch earlier than 72 hours if you experience unpleasant side effects which may include dry mouth, dizziness or visual disturbances. 3. Avoid touching the patch. Wash your hands with soap and water after contact with the patch.

## 2017-06-14 NOTE — Anesthesia Procedure Notes (Signed)
Procedure Name: MAC Performed by: Terrance Mass, CRNA Pre-anesthesia Checklist: Patient identified, Timeout performed, Emergency Drugs available, Suction available and Patient being monitored Oxygen Delivery Method: Simple face mask

## 2017-06-14 NOTE — Interval H&P Note (Signed)
History and Physical Interval Note:  06/14/2017 3:09 PM  Krystal [name]  has presented today for surgery, with the diagnosis of Left Breast Cancer  The various methods of treatment have been discussed with the patient and family. After consideration of risks, benefits and other options for treatment, the patient has consented to  Procedure(s): REMOVAL PORT-A-CATH (Right) as a surgical intervention .  The patient's history has been reviewed, patient examined, no change in status, stable for surgery.  I have reviewed the patient's chart and labs.  Questions were answered to the patient's satisfaction.     Parminder Cupples

## 2017-06-15 ENCOUNTER — Encounter (HOSPITAL_BASED_OUTPATIENT_CLINIC_OR_DEPARTMENT_OTHER): Payer: Self-pay | Admitting: General Surgery

## 2017-08-20 ENCOUNTER — Ambulatory Visit: Payer: Commercial Managed Care - PPO | Admitting: Hematology and Oncology

## 2017-08-20 NOTE — Assessment & Plan Note (Deleted)
T1 C. N0 M0 stage IA ER 100%, PR 95%, HER-2 negative ratio 1.9, Ki-67 15% with additional satellite nodule 0.6 cm: Oncotype DX recurrence score is 13 (9% risk of recurrence)  Antiestrogen therapy: Patient took tamoxifen from 05/26/2014 to 06/07/2014 and stopped it because of itching. Patient had undergone oophorectomy with TAH for reduction of risk of ovarian cancer.   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 Adjuvant radiation started 11/24/2015- completed 01/14/2016 2. Herceptin every 3 weeks started 11/08/2015 with anastrozole. Because of decline in the heart ejection fraction, Herceptin was on hold and then resumed and completed July 2018   Current treatment: Anastrozole restarted 03/13/2016 stopped in January 2018, started letrozole 11/23/2016  Letrozole toxicities: profound headaches and arthralgias. She stopped taking it  Currently on exemestane 25 mg daily  Return to clinic in 1 yr for follow-up

## 2017-08-21 ENCOUNTER — Ambulatory Visit: Payer: Commercial Managed Care - PPO | Admitting: Hematology and Oncology

## 2017-08-21 NOTE — Progress Notes (Deleted)
Patient Care Team: Patient, [name] Krystal Lose, MD as Consulting Physician (Hematology and Oncology) Krystal Pray, MD as Consulting Physician (Radiation Oncology) Krystal Bison Charlestine Massed, NP as Nurse Practitioner (Hematology and Oncology) Krystal Bookbinder, MD as Consulting Physician (General Surgery)  DIAGNOSIS:  Encounter Diagnoses  Name Primary?  . 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 Malignant neoplasm of central portion of left breast in female, estrogen receptor positive ([place])     SUMMARY OF ONCOLOGIC HISTORY:   Cancer of central portion of left female breast ([place])   04/16/2014 Surgery    Bilateral mastectomies: Left breast : Multifocal invasive ductal carcinoma positive for lymphovascular invasion, 1.2 cm and 0.6 cm, grade 3, high-grade DCIS with comedonecrosis, 4 SLN negative T1 C. N0 M0 stage IA: Oncotype DX 13 (ROR 9%)      04/16/2014 Surgery    Left upper back melanoma resected no residual melanoma identified on re-resection margins negative      05/26/2014 - 06/08/2015 Anti-estrogen oral therapy    Tamoxifen 20 mg daily with a plan to switch her to aromatase inhibitors once she gets oophorectomy ( patient did not continue antiestrogen therapy by choice)      08/13/2014 Surgery    oophorectomy with total abdominal hysterectomy      09/08/2015 -  Anti-estrogen oral therapy    Resumed anastrozole after she found recurrence of breast cancer; stopped in January 2018, started letrozole 2.5 mg daily 10/12/2016; switched to Exemestane 25 mg daily on 01/03/17      09/16/2015 Surgery    Skin, left: IDC grade 3; 0.5 cm, margins negative, ER 95%, PR 10%, HER-2 positive ratio 2.08      11/08/2015 - 02/2017 Chemotherapy    Herceptin every 3 weeks plus anastrozole      11/24/2015 - 01/14/2016 Radiation Therapy    Adjuvant radiation therapy by Dr. Sondra Come      01/2017 -  Anti-estrogen oral therapy    Exemestane daily (stopped  others due to side effects)       CHIEF COMPLIANT: Follow-up on exemestane  INTERVAL HISTORY: Krystal [name] is a 52 year old with above-mentioned history of bilateral mastectomies for left breast cancer.  She finished adjuvant Herceptin.  She was on antiestrogen therapy.  She tried anastrozole, letrozole and even exemestane.  She could not tolerate any of them and she stopped them for side effects.  She is here for routine follow-up.  She previously took tamoxifen and had tolerated it reasonably well.  She is here today to discuss treatment options.  She denies any lumps or nodules.  REVIEW OF SYSTEMS:   Constitutional: Denies fevers, chills or abnormal weight loss Eyes: Denies blurriness of vision Ears, nose, mouth, throat, and face: Denies mucositis or sore throat Respiratory: Denies cough, dyspnea or wheezes Cardiovascular: Denies palpitation, chest discomfort Gastrointestinal:  Denies nausea, heartburn or change in bowel habits Skin: Denies abnormal skin rashes Lymphatics: Denies new lymphadenopathy or easy bruising Neurological:Denies numbness, tingling or new weaknesses Behavioral/Psych: Mood is stable, no new changes  Extremities: No lower extremity edema Breast: No palpable lumps or nodules in chest wall All other systems were reviewed with the patient and are negative.  I have reviewed the past medical history, past surgical history, social history and family history with the patient and they are unchanged from previous note.  ALLERGIES:  is allergic to dilaudid [hydromorphone] and codeine.  MEDICATIONS:  Current Outpatient Medications  Medication Sig Dispense Refill  . acetaminophen (TYLENOL)  500 MG tablet Take 500 mg by mouth every 6 (six) hours as needed. Reported on 02/03/2016    . exemestane (AROMASIN) 25 MG tablet Take 1 tablet (25 mg total) by mouth daily after breakfast. 30 tablet 0   No current facility-administered medications for this visit.     PHYSICAL  EXAMINATION: ECOG PERFORMANCE STATUS: 1 - Symptomatic but completely ambulatory  There were no vitals filed for this visit. There were no vitals filed for this visit.  GENERAL:alert, no distress and comfortable SKIN: skin color, texture, turgor are normal, no rashes or significant lesions EYES: normal, Conjunctiva are pink and non-injected, sclera clear OROPHARYNX:no exudate, no erythema and lips, buccal mucosa, and tongue normal  NECK: supple, thyroid normal size, non-tender, without nodularity LYMPH:  no palpable lymphadenopathy in the cervical, axillary or inguinal LUNGS: clear to auscultation and percussion with normal breathing effort HEART: regular rate & rhythm and no murmurs and no lower extremity edema ABDOMEN:abdomen soft, non-tender and normal bowel sounds MUSCULOSKELETAL:no cyanosis of digits and no clubbing  NEURO: alert & oriented x 3 with fluent speech, no focal motor/sensory deficits EXTREMITIES: No lower extremity edema  LABORATORY DATA:  I have reviewed the data as listed CMP Latest Ref Rng & Units 02/16/2017 01/03/2017 11/23/2016  Glucose 70 - 140 mg/dl 190(H) 100 108  BUN 7.0 - 26.0 mg/dL 7.9 10.2 8.8  Creatinine 0.6 - 1.1 mg/dL 0.8 0.8 0.8  Sodium 136 - 145 mEq/L 142 141 142  Potassium 3.5 - 5.1 mEq/L 4.3 4.0 4.0  Chloride 101 - 111 mmol/L - - -  CO2 22 - 29 mEq/L 27 28 30(H)  Calcium 8.4 - 10.4 mg/dL 9.4 9.5 9.3  Total Protein 6.4 - 8.3 g/dL 7.4 7.6 7.5  Total Bilirubin 0.20 - 1.20 mg/dL 0.41 0.52 0.45  Alkaline Phos 40 - 150 U/L 88 97 92  AST 5 - 34 U/L 27 29 32  ALT 0 - 55 U/L 50 47 46    Lab Results  Component Value Date   WBC 5.2 02/16/2017   HGB 14.1 02/16/2017   HCT 41.9 02/16/2017   MCV 90.6 02/16/2017   PLT 232 02/16/2017   NEUTROABS 3.4 02/16/2017    ASSESSMENT & PLAN:  Cancer of central portion of left female breast T1 C. N0 M0 stage IA ER 100%, PR 95%, HER-2 negative ratio 1.9, Ki-67 15% with additional satellite nodule 0.6 cm: Oncotype  DX recurrence score is 13 (9% risk of recurrence)  Antiestrogen therapy: Patient took tamoxifen from 05/26/2014 to 06/07/2014 and stopped it because of itching. Patient had undergone oophorectomy with TAH for reduction of risk of ovarian cancer.  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 Adjuvant radiation started 11/24/2015- completed 01/14/2016 2. Herceptin every 3 weeks started 11/08/2015 with anastrozole. Because of decline in the heart ejection fraction, Herceptin was on hold and then resumed and completed July 2018  Current treatment: Anastrozole restarted 03/13/2016 stopped in January 2018, started letrozole 11/23/2016, switched to exemestane June 2018  Exemestane toxicities:   Return to clinic in 1 yr for follow-up   I spent 25 minutes talking to the patient of which more than half was spent in counseling and coordination of care.  No orders of the defined types were placed in this encounter.  The patient has a good understanding of the overall plan. she agrees with it. she will call  with any problems that may develop before the next visit here.   Krystal Ohara, MD 08/21/17

## 2017-10-20 ENCOUNTER — Other Ambulatory Visit: Payer: Self-pay | Admitting: Hematology and Oncology

## 2017-11-30 ENCOUNTER — Telehealth: Payer: Self-pay

## 2017-11-30 NOTE — Telephone Encounter (Signed)
Returned pt call regarding joint pain related to aromasin. Instructed patient to stop medication for two weeks to see if pain subsides. Made an appt for her to see Dr. Lindi Adie in two weeks to discuss options.  Cyndia Bent RN

## 2017-12-18 ENCOUNTER — Inpatient Hospital Stay: Payer: Commercial Managed Care - PPO | Attending: Hematology and Oncology | Admitting: Hematology and Oncology

## 2017-12-18 DIAGNOSIS — Z9221 Personal history of antineoplastic chemotherapy: Secondary | ICD-10-CM | POA: Diagnosis not present

## 2017-12-18 DIAGNOSIS — Z90710 Acquired absence of both cervix and uterus: Secondary | ICD-10-CM | POA: Insufficient documentation

## 2017-12-18 DIAGNOSIS — Z9013 Acquired absence of bilateral breasts and nipples: Secondary | ICD-10-CM | POA: Insufficient documentation

## 2017-12-18 DIAGNOSIS — R232 Flushing: Secondary | ICD-10-CM | POA: Diagnosis not present

## 2017-12-18 DIAGNOSIS — Z170 Estrogen receptor positive status [ER+]: Secondary | ICD-10-CM

## 2017-12-18 DIAGNOSIS — Z90722 Acquired absence of ovaries, bilateral: Secondary | ICD-10-CM | POA: Insufficient documentation

## 2017-12-18 DIAGNOSIS — Z79810 Long term (current) use of selective estrogen receptor modulators (SERMs): Secondary | ICD-10-CM | POA: Insufficient documentation

## 2017-12-18 DIAGNOSIS — Z923 Personal history of irradiation: Secondary | ICD-10-CM | POA: Diagnosis not present

## 2017-12-18 DIAGNOSIS — C50112 Malignant neoplasm of central portion of left female breast: Secondary | ICD-10-CM | POA: Diagnosis not present

## 2017-12-18 DIAGNOSIS — Z85820 Personal history of malignant melanoma of skin: Secondary | ICD-10-CM | POA: Insufficient documentation

## 2017-12-18 MED ORDER — TAMOXIFEN CITRATE 10 MG PO TABS: 5.0000 mg | ORAL_TABLET | Freq: Every day | ORAL | 3 refills | Status: DC

## 2017-12-18 NOTE — Progress Notes (Signed)
Patient Care Team: Patient, [name] [name], MD as Consulting Physician (Hematology and Oncology) [name], MD as Consulting Physician (Radiation Oncology) [name], NP as Nurse Practitioner (Hematology and Oncology) Rolm Bookbinder, MD as Consulting Physician (General Surgery)  DIAGNOSIS:  Encounter Diagnosis  Name Primary?  . Malignant neoplasm of central portion of left breast in female, estrogen receptor positive ([place])     SUMMARY OF ONCOLOGIC HISTORY:   Cancer of central portion of left female breast ([place])   04/16/2014 Surgery    Bilateral mastectomies: Left breast : Multifocal invasive ductal carcinoma positive for lymphovascular invasion, 1.2 cm and 0.6 cm, grade 3, high-grade DCIS with comedonecrosis, 4 SLN negative T1 C. N0 M0 stage IA: Oncotype DX 13 (ROR 9%)      04/16/2014 Surgery    Left upper back melanoma resected no residual melanoma identified on re-resection margins negative      05/26/2014 - 06/08/2015 Anti-estrogen oral therapy    Tamoxifen 20 mg daily with a plan to switch her to aromatase inhibitors once she gets oophorectomy ( patient did not continue antiestrogen therapy by choice)      08/13/2014 Surgery    oophorectomy with total abdominal hysterectomy      09/08/2015 -  Anti-estrogen oral therapy    Resumed anastrozole after she found recurrence of breast cancer; stopped in January 2018, started letrozole 2.5 mg daily 10/12/2016; switched to Exemestane 25 mg daily on 01/03/17      09/16/2015 Surgery    Skin, left: IDC grade 3; 0.5 cm, margins negative, ER 95%, PR 10%, HER-2 positive ratio 2.08      11/08/2015 - 02/2017 Chemotherapy    Herceptin every 3 weeks plus anastrozole      11/24/2015 - 01/14/2016 Radiation Therapy    Adjuvant radiation therapy by Dr. Sondra Come      01/2017 -  Anti-estrogen oral therapy    Exemestane daily (stopped others due to side effects), due to muscle aches and  pains switch to tamoxifen 5 mg daily starting 12/18/2017       CHIEF COMPLIANT: Follow-up on exemestane therapy  INTERVAL HISTORY: Krystal [name] is a 52 year old with above-mentioned history of recurrent breast cancer who is currently on antiestrogen therapy with exemestane.  She complains of hot flashes.  S she is unable to tolerate dexamethasone either because of diffuse muscle aches and pains.  REVIEW OF SYSTEMS:   Constitutional: Denies fevers, chills or abnormal weight loss Eyes: Denies blurriness of vision Ears, nose, mouth, throat, and face: Denies mucositis or sore throat Respiratory: Denies cough, dyspnea or wheezes Cardiovascular: Denies palpitation, chest discomfort Gastrointestinal:  Denies nausea, heartburn or change in bowel habits Skin: Denies abnormal skin rashes Lymphatics: Denies new lymphadenopathy or easy bruising Neurological:Denies numbness, tingling or new weaknesses Behavioral/Psych: Mood is stable, no new changes  Extremities: No lower extremity edema Breast:  denies any pain or lumps or nodules in either breasts All other systems were reviewed with the patient and are negative.  I have reviewed the past medical history, past surgical history, social history and family history with the patient and they are unchanged from previous note.  ALLERGIES:  is allergic to dilaudid [hydromorphone] and codeine.  MEDICATIONS:  Current Outpatient Medications  Medication Sig Dispense Refill  . acetaminophen (TYLENOL) 500 MG tablet Take 500 mg by mouth every 6 (six) hours as needed. Reported on 02/03/2016    . exemestane (AROMASIN) 25 MG tablet TAKE 1 TABLET (25  MG TOTAL) BY MOUTH DAILY AFTER BREAKFAST. 90 tablet 3  . tamoxifen (NOLVADEX) 10 MG tablet Take 0.5 tablets (5 mg total) by mouth daily. 30 tablet 3   No current facility-administered medications for this visit.     PHYSICAL EXAMINATION: ECOG PERFORMANCE STATUS: 1 - Symptomatic but completely  ambulatory  Vitals:   12/18/17 0937  BP: 127/80  Pulse: 77  Resp: 18  Temp: 98.8 F (37.1 C)  SpO2: 95%   Filed Weights   12/18/17 0937  Weight: 266 lb 14.4 oz (121.1 kg)    GENERAL:alert, no distress and comfortable SKIN: skin color, texture, turgor are normal, no rashes or significant lesions EYES: normal, Conjunctiva are pink and non-injected, sclera clear OROPHARYNX:no exudate, no erythema and lips, buccal mucosa, and tongue normal  NECK: supple, thyroid normal size, non-tender, without nodularity LYMPH:  no palpable lymphadenopathy in the cervical, axillary or inguinal LUNGS: clear to auscultation and percussion with normal breathing effort HEART: regular rate & rhythm and no murmurs and no lower extremity edema ABDOMEN:abdomen soft, non-tender and normal bowel sounds MUSCULOSKELETAL:no cyanosis of digits and no clubbing  NEURO: alert & oriented x 3 with fluent speech, no focal motor/sensory deficits EXTREMITIES: No lower extremity edema BREAST: No palpable masses or nodules in either right or left breasts. No palpable axillary supraclavicular or infraclavicular adenopathy no breast tenderness or nipple discharge. (exam performed in the presence of a chaperone)  LABORATORY DATA:  I have reviewed the data as listed CMP Latest Ref Rng & Units 02/16/2017 01/03/2017 11/23/2016  Glucose 70 - 140 mg/dl 190(H) 100 108  BUN 7.0 - 26.0 mg/dL 7.9 10.2 8.8  Creatinine 0.6 - 1.1 mg/dL 0.8 0.8 0.8  Sodium 136 - 145 mEq/L 142 141 142  Potassium 3.5 - 5.1 mEq/L 4.3 4.0 4.0  Chloride 101 - 111 mmol/L - - -  CO2 22 - 29 mEq/L 27 28 30(H)  Calcium 8.4 - 10.4 mg/dL 9.4 9.5 9.3  Total Protein 6.4 - 8.3 g/dL 7.4 7.6 7.5  Total Bilirubin 0.20 - 1.20 mg/dL 0.41 0.52 0.45  Alkaline Phos 40 - 150 U/L 88 97 92  AST 5 - 34 U/L 27 29 32  ALT 0 - 55 U/L 50 47 46    Lab Results  Component Value Date   WBC 5.2 02/16/2017   HGB 14.1 02/16/2017   HCT 41.9 02/16/2017   MCV 90.6 02/16/2017   PLT  232 02/16/2017   NEUTROABS 3.4 02/16/2017    ASSESSMENT & PLAN:  Cancer of central portion of left female breast T1 C. N0 M0 stage IA ER 100%, PR 95%, HER-2 negative ratio 1.9, Ki-67 15% with additional satellite nodule 0.6 cm: Oncotype DX recurrence score is 13 (9% risk of recurrence)  Antiestrogen therapy: Patient took tamoxifen from 05/26/2014 to 06/07/2014 and stopped it because of itching. Patient had undergone oophorectomy with TAH for reduction of risk of ovarian cancer.   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 Adjuvant radiation started 11/24/2015- completed 01/14/2016 2. Herceptin every 3 weeks started 11/08/2015 with anastrozole. Because of decline in the heart ejection fraction, Herceptin was on hold and then resumed, completed February 16, 2017  Current treatment: Anastrozole restarted 03/13/2016 stopped in January 2018, started letrozole 11/23/2016, switched to exemestane June 2018 She is unable to tolerate exemestane due to muscle aches and pains.  Because of this we will  switch her to tamoxifen and we will use 5 mg daily based upon the recently presented TAM01 clinical trial which showed benefit even at 5 mg of tamoxifen.   Exemestane toxicities: hot flashes, severe myalgias and arthralgias  Return to clinic in 1 year for follow-up  No orders of the defined types were placed in this encounter.  The patient has a good understanding of the overall plan. she agrees with it. she will call with any problems that may develop before the next visit here.   Harriette Ohara, MD 12/18/17

## 2017-12-18 NOTE — Assessment & Plan Note (Signed)
T1 C. N0 M0 stage IA ER 100%, PR 95%, HER-2 negative ratio 1.9, Ki-67 15% with additional satellite nodule 0.6 cm: Oncotype DX recurrence score is 13 (9% risk of recurrence)  Antiestrogen therapy: Patient took tamoxifen from 05/26/2014 to 06/07/2014 and stopped it because of itching. Patient had undergone oophorectomy with TAH for reduction of risk of ovarian cancer.   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 Adjuvant radiation started 11/24/2015- completed 01/14/2016 2. Herceptin every 3 weeks started 11/08/2015 with anastrozole. Because of decline in the heart ejection fraction, Herceptin was on hold and then resumed, completed February 16, 2017  Current treatment: Anastrozole restarted 03/13/2016 stopped in January 2018, started letrozole 11/23/2016, switched to exemestane June 2018  Exemestane toxicities: hot flashes I discussed with her that if the hot flashes get significantly worse we can send a prescription for Effexor. She does not want to take anything at this time.  Return to clinic in 1 year for follow-up

## 2017-12-21 ENCOUNTER — Telehealth: Payer: Self-pay | Admitting: Hematology and Oncology

## 2017-12-21 NOTE — Telephone Encounter (Signed)
Mailed patient calendar of upcoming may 2020 appointments.

## 2018-04-19 DIAGNOSIS — E119 Type 2 diabetes mellitus without complications: Secondary | ICD-10-CM | POA: Insufficient documentation

## 2018-12-18 NOTE — Assessment & Plan Note (Deleted)
T1 C. N0 M0 stage IA ER 100%, PR 95%, HER-2 negative ratio 1.9, Ki-67 15% with additional satellite nodule 0.6 cm: Oncotype DX recurrence score is 13 (9% risk of recurrence)  Antiestrogen therapy: Patient took tamoxifen from 05/26/2014 to 06/07/2014 and stopped it because of itching. Patient had undergone oophorectomy with TAH for reduction of risk of ovarian cancer.   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 Adjuvant radiation started 11/24/2015- completed 01/14/2016 2. Herceptin every 3 weeks started 11/08/2015 with anastrozole. Because of decline in the heart ejection fraction, Herceptin was on hold and then resumed, completed February 16, 2017  Current treatment: Anastrozole restarted 03/13/2016 stopped in January 2018, started letrozole 11/23/2016,switchedto exemestane June 2018, switched to tamoxifen 12/18/2017  She is unable to tolerate exemestane due to muscle aches and pains.  Because of this we will switch her to tamoxifen and we will use 5 mg daily based upon the recently presented TAM01 clinical trial which showed benefit even at 5 mg of tamoxifen.   Tamoxifentoxicities:At 5 mg daily  Breast cancer surveillance: Mammogram at [facility] November 2019:  Return to clinic in 1 year for follow-up

## 2018-12-19 ENCOUNTER — Telehealth: Payer: Self-pay | Admitting: Hematology and Oncology

## 2018-12-19 NOTE — Telephone Encounter (Signed)
Called patient regarding upcoming Webex appointment, left patient a voicemail and sent an e-mail.

## 2018-12-19 NOTE — Telephone Encounter (Signed)
Left vm for pt to call back and try to convert 12/24/18 office visit into Webex mtg or D[employer]im[facility] call.

## 2018-12-20 ENCOUNTER — Telehealth: Payer: Self-pay | Admitting: Hematology and Oncology

## 2018-12-20 NOTE — Telephone Encounter (Signed)
Called regarding upcoming Webex appointment, per patient's request the appointment has been rescheduled for July.

## 2018-12-24 ENCOUNTER — Ambulatory Visit: Payer: Commercial Managed Care - PPO | Admitting: Hematology and Oncology

## 2019-02-19 NOTE — Assessment & Plan Note (Deleted)
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 Patient had undergone oophorectomy with TAH for reduction of risk of ovarian cancer.   Relapsed disease: Cutaneous recurrence status post surgery 09/16/2015 0.5 cm invasive ductal carcinoma grade 3 that is HER-2 positive ratio 2.08, ER 95%, PR 10%  Treatment summary: 1. Adjuvant radiation started 11/24/2015- completed 01/14/2016 2. Herceptin every 3 weeks started 11/08/2015 with anastrozole. Because of decline in the heart ejection fraction, Herceptin was on hold and then resumed, completed February 16, 2017  Current treatment: Anastrozole restarted 03/13/2016 stopped in January 2018, started letrozole 11/23/2016,switchedto exemestane June 2018, switched to tamoxifen 12/18/2017 at low dose of 5 mg.  Tamoxifen toxicities:  Breast cancer surveillance: No role of imaging because she had bilateral mastectomies

## 2019-02-25 ENCOUNTER — Inpatient Hospital Stay: Payer: Commercial Managed Care - PPO | Admitting: Hematology and Oncology

## 2019-06-18 ENCOUNTER — Encounter: Payer: Self-pay | Admitting: Hematology and Oncology

## 2019-06-24 ENCOUNTER — Telehealth: Payer: Self-pay | Admitting: Hematology and Oncology

## 2019-06-24 NOTE — Telephone Encounter (Signed)
Scheduled appt per 11/17 sch message - unable to reach pt or leave message - mailed letter

## 2019-08-12 ENCOUNTER — Other Ambulatory Visit: Payer: Self-pay

## 2019-08-12 ENCOUNTER — Inpatient Hospital Stay: Payer: Commercial Managed Care - PPO

## 2019-08-12 ENCOUNTER — Inpatient Hospital Stay: Payer: Commercial Managed Care - PPO | Attending: Hematology and Oncology | Admitting: Hematology and Oncology

## 2019-08-12 VITALS — BP 141/94 | HR 104 | Temp 98.1°F | Resp 18 | Ht 64.0 in | Wt 249.4 lb

## 2019-08-12 DIAGNOSIS — F064 Anxiety disorder due to known physiological condition: Secondary | ICD-10-CM | POA: Insufficient documentation

## 2019-08-12 DIAGNOSIS — C50919 Malignant neoplasm of unspecified site of unspecified female breast: Secondary | ICD-10-CM | POA: Diagnosis not present

## 2019-08-12 DIAGNOSIS — C50112 Malignant neoplasm of central portion of left female breast: Secondary | ICD-10-CM

## 2019-08-12 DIAGNOSIS — Z90722 Acquired absence of ovaries, bilateral: Secondary | ICD-10-CM | POA: Insufficient documentation

## 2019-08-12 DIAGNOSIS — Z1501 Genetic susceptibility to malignant neoplasm of breast: Secondary | ICD-10-CM | POA: Diagnosis not present

## 2019-08-12 DIAGNOSIS — Z9221 Personal history of antineoplastic chemotherapy: Secondary | ICD-10-CM | POA: Diagnosis not present

## 2019-08-12 DIAGNOSIS — Z90710 Acquired absence of both cervix and uterus: Secondary | ICD-10-CM | POA: Insufficient documentation

## 2019-08-12 DIAGNOSIS — Z923 Personal history of irradiation: Secondary | ICD-10-CM | POA: Diagnosis not present

## 2019-08-12 DIAGNOSIS — Z79811 Long term (current) use of aromatase inhibitors: Secondary | ICD-10-CM | POA: Insufficient documentation

## 2019-08-12 DIAGNOSIS — C7951 Secondary malignant neoplasm of bone: Secondary | ICD-10-CM | POA: Diagnosis not present

## 2019-08-12 DIAGNOSIS — Z170 Estrogen receptor positive status [ER+]: Secondary | ICD-10-CM

## 2019-08-12 DIAGNOSIS — Z85820 Personal history of malignant melanoma of skin: Secondary | ICD-10-CM | POA: Diagnosis not present

## 2019-08-12 DIAGNOSIS — Z9013 Acquired absence of bilateral breasts and nipples: Secondary | ICD-10-CM | POA: Insufficient documentation

## 2019-08-12 DIAGNOSIS — Z79899 Other long term (current) drug therapy: Secondary | ICD-10-CM | POA: Diagnosis not present

## 2019-08-12 LAB — CBC WITH DIFFERENTIAL (CANCER CENTER ONLY)
Abs Immature Granulocytes: 0.01 10*3/uL (ref 0.00–0.07)
Basophils Absolute: 0.1 10*3/uL (ref 0.0–0.1)
Basophils Relative: 1 %
Eosinophils Absolute: 0.2 10*3/uL (ref 0.0–0.5)
Eosinophils Relative: 2 %
HCT: 45.3 % (ref 36.0–46.0)
Hemoglobin: 14.9 g/dL (ref 12.0–15.0)
Immature Granulocytes: 0 %
Lymphocytes Relative: 20 %
Lymphs Abs: 1.5 10*3/uL (ref 0.7–4.0)
MCH: 29.7 pg (ref 26.0–34.0)
MCHC: 32.9 g/dL (ref 30.0–36.0)
MCV: 90.4 fL (ref 80.0–100.0)
Monocytes Absolute: 0.4 10*3/uL (ref 0.1–1.0)
Monocytes Relative: 5 %
Neutro Abs: 5.6 10*3/uL (ref 1.7–7.7)
Neutrophils Relative %: 72 %
Platelet Count: 297 10*3/uL (ref 150–400)
RBC: 5.01 MIL/uL (ref 3.87–5.11)
RDW: 13.2 % (ref 11.5–15.5)
WBC Count: 7.7 10*3/uL (ref 4.0–10.5)
nRBC: 0 % (ref 0.0–0.2)

## 2019-08-12 LAB — CMP (CANCER CENTER ONLY)
ALT: 29 U/L (ref 0–44)
AST: 18 U/L (ref 15–41)
Albumin: 4.1 g/dL (ref 3.5–5.0)
Alkaline Phosphatase: 81 U/L (ref 38–126)
Anion gap: 12 (ref 5–15)
BUN: 14 mg/dL (ref 6–20)
CO2: 27 mmol/L (ref 22–32)
Calcium: 9.3 mg/dL (ref 8.9–10.3)
Chloride: 103 mmol/L (ref 98–111)
Creatinine: 0.82 mg/dL (ref 0.44–1.00)
GFR, Est AFR Am: >60 mL/min (ref >60)
GFR, Estimated: >60 mL/min (ref >60)
Glucose, Bld: 104 mg/dL — ABNORMAL HIGH (ref 70–99)
Potassium: 4.4 mmol/L (ref 3.5–5.1)
Sodium: 142 mmol/L (ref 135–145)
Total Bilirubin: 0.4 mg/dL (ref 0.3–1.2)
Total Protein: 7.7 g/dL (ref 6.5–8.1)

## 2019-08-12 LAB — RESEARCH LABS

## 2019-08-12 MED ORDER — ALPRAZOLAM 0.25 MG PO TABS: 0.2500 mg | ORAL_TABLET | Freq: Two times a day (BID) | ORAL | 2 refills | Status: DC | PRN

## 2019-08-12 MED ORDER — DEXAMETHASONE 1 MG PO TABS: 1.0000 mg | ORAL_TABLET | Freq: Two times a day (BID) | ORAL | 0 refills | Status: DC

## 2019-08-12 NOTE — Assessment & Plan Note (Signed)
04/16/2014: Bilateral mastectomies: Left breast multifocal IDC with lymphovascular invasion 1.2 cm and 0.6 cm grade 3 T1 C. N0 M0 stage IA ER 100%, PR 95%, HER-2 negative ratio 1.9, Ki-67 15%:  Oncotype DX recurrence score is 13 (9% risk of recurrence)  Antiestrogen therapy: Patient took tamoxifen from 05/26/2014 to 06/07/2014 and stopped it because of itching. Patient had undergone oophorectomy with TAH for reduction of risk of ovarian cancer.   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 Adjuvant radiation started 11/24/2015- completed 01/14/2016 2. Herceptin every 3 weeks started 11/08/2015 with anastrozole. Because of decline in the heart ejection fraction, Herceptin was on hold and then resumed, completed February 16, 2017 3. Current treatment: Anastrozole restarted 03/13/2016 stopped in January 2018, started letrozole 11/23/2016, switched to exemestane June 2018  Back pain: Saw orthopedics who performed MRI of her left hip which revealed L5 vertebral body lesion with possible extraosseous extension Bone scan 08/07/2019: Increase activity with sclerotic bone metastases involving left femoral neck right ninth rib and entire L5 vertebra  Treatment plan: 1.  Biopsy of the L5 vertebra to do prognostic panel and molecular testing 2.  Treatment plan based upon pathology report whether it is HER-2 positive and whether it is ER/PR positive. If it is still estrogen receptor positive then potential plan could be with Faslodex with Ibrance along with Xgeva for bone metastases  Return to clinic after the biopsy to discuss pathology report.

## 2019-08-12 NOTE — Progress Notes (Signed)
Patient Care Team: Patient, [name] Krystal Lose, MD as Consulting Physician (Hematology and Oncology) Krystal Pray, MD as Consulting Physician (Radiation Oncology) Krystal Bison Charlestine Massed, NP as Nurse Practitioner (Hematology and Oncology) Krystal Bookbinder, MD as Consulting Physician (General Surgery)  DIAGNOSIS:  Encounter Diagnoses  Name Primary?  . Malignant neoplasm of central portion of left breast in female, estrogen receptor positive (Krystal [name])   . Metastatic breast cancer (Krystal [name]) Yes  . Bone metastasis (Krystal [name])     SUMMARY OF ONCOLOGIC HISTORY: Oncology History  Cancer of central portion of left female breast (Krystal [name])  04/16/2014 Surgery   Bilateral mastectomies: Left breast : Multifocal invasive ductal carcinoma positive for lymphovascular invasion, 1.2 cm and 0.6 cm, grade 3, high-grade DCIS with comedonecrosis, 4 SLN negative T1 C. N0 M0 stage IA: Oncotype DX 13 (ROR 9%)   04/16/2014 Surgery   Left upper back melanoma resected no residual melanoma identified on re-resection margins negative   05/26/2014 - 06/08/2015 Anti-estrogen oral therapy   Tamoxifen 20 mg daily with a plan to switch her to aromatase inhibitors once she gets oophorectomy ( patient did not continue antiestrogen therapy by choice)   08/13/2014 Surgery   oophorectomy with total abdominal hysterectomy   09/08/2015 -  Anti-estrogen oral therapy   Resumed anastrozole after she found recurrence of breast cancer; stopped in January 2018, started letrozole 2.5 mg daily 10/12/2016; switched to Exemestane 25 mg daily on 01/03/17   09/16/2015 Surgery   Skin, left: IDC grade 3; 0.5 cm, margins negative, ER 95%, PR 10%, HER-2 positive ratio 2.08   11/08/2015 - 02/2017 Chemotherapy   Herceptin every 3 weeks plus anastrozole   11/24/2015 - 01/14/2016 Radiation Therapy   Adjuvant radiation therapy by Dr. Sondra Come   01/2017 -  Anti-estrogen oral therapy   Exemestane daily (stopped others due  to side effects), due to muscle aches and pains switch to tamoxifen 5 mg daily starting 12/18/2017   08/07/2019 Relapse/Recurrence   Bone scan: Sclerotic metastatic lesions involving left femoral neck, right ninth rib and entire L5 vertebra; MRI left hip: Diffuse signal abnormality L5 vertebral body with possible extraosseous extension     CHIEF COMPLIANT: Newly diagnosed metastatic disease to the bone  INTERVAL HISTORY: Krystal [name] is a 54 year old with above-mentioned history of breast cancer diagnosed in 2015 and she underwent bilateral mastectomies for BRCA mutation.  She could not tolerate antiestrogen therapy.  She had a skin recurrence in 2017 that was HER-2 positive and was treated with Herceptin plus anastrozole.  She also received adjuvant radiation.  Subsequently she tried antiestrogen therapy once again and could not tolerate it and she discontinued it about 2 years ago.  She was briefly lost to follow-up but complained of some tingling of her right foot about 8 months ago and pain in the left leg and her back which led to orthopedic referral.  Initial x-rays led to MRIs which led to bone scans that showed L5 vertebral body lesion as well as a lesion of the left femur and an left ninth rib. She was referred to [place] for further evaluation and treatment options.  I spoke with Deborha Payment, PA at [facility] which is part of [facility] regarding her and coordinating her care.  She is an emotional wreck today thinking that she has very short life expectancy because of this.  She has not informed her children at all.   ALLERGIES:  is allergic to dilaudid [hydromorphone] and codeine.  MEDICATIONS:  Current Outpatient Medications  Medication Sig Dispense Refill  . acetaminophen (TYLENOL) 500 MG tablet Take 500 mg by mouth every 6 (six) hours as needed. Reported on 02/03/2016    . exemestane (AROMASIN) 25 MG tablet TAKE 1 TABLET (25 MG TOTAL) BY MOUTH DAILY AFTER BREAKFAST. 90 tablet 3  .  tamoxifen (NOLVADEX) 10 MG tablet Take 0.5 tablets (5 mg total) by mouth daily. 30 tablet 3   No current facility-administered medications for this visit.    PHYSICAL EXAMINATION: ECOG PERFORMANCE STATUS: 1 - Symptomatic but completely ambulatory  Vitals:   08/12/19 1148  BP: (!) 141/94  Pulse: (!) 104  Resp: 18  Temp: 98.1 F (36.7 C)  SpO2: 97%   Filed Weights   08/12/19 1148  Weight: 249 lb 6.4 oz (113.1 kg)    BREAST: Bilateral mastectomies  LABORATORY DATA:  I have reviewed the data as listed CMP Latest Ref Rng & Units 08/12/2019 02/16/2017 01/03/2017  Glucose 70 - 99 mg/dL 104(H) 190(H) 100  BUN 6 - 20 mg/dL 14 7.9 10.2  Creatinine 0.44 - 1.00 mg/dL 0.82 0.8 0.8  Sodium 135 - 145 mmol/L 142 142 141  Potassium 3.5 - 5.1 mmol/L 4.4 4.3 4.0  Chloride 98 - 111 mmol/L 103 - -  CO2 22 - 32 mmol/L _0 Calcium 8.9 - 10.3 mg/dL 9.3 9.4 9.5  Total Protein 6.5 - 8.1 g/dL 7.7 7.4 7.6  Total Bilirubin 0.3 - 1.2 mg/dL 0.4 0.41 0.52  Alkaline Phos 38 - 126 U/L 81 88 97  AST 15 - 41 U/L _1 ALT 0 - 44 U/L 29 50 47    Lab Results  Component Value Date   WBC 7.7 08/12/2019   HGB 14.9 08/12/2019   HCT 45.3 08/12/2019   MCV 90.4 08/12/2019   PLT 297 08/12/2019   NEUTROABS 5.6 08/12/2019    ASSESSMENT & PLAN:  Cancer of central portion of left female breast 04/16/2014: Bilateral mastectomies: Left breast multifocal IDC with lymphovascular invasion 1.2 cm and 0.6 cm grade 3 T1 C. N0 M0 stage IA ER 100%, PR 95%, HER-2 negative ratio 1.9, Ki-67 15%:  Oncotype DX recurrence score is 13 (9% risk of recurrence)  Antiestrogen therapy: Patient took tamoxifen from 05/26/2014 to 06/07/2014 and stopped it because of itching. Patient had undergone oophorectomy with TAH for reduction of risk of ovarian cancer.   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 Adjuvant radiation started 11/24/2015- completed 01/14/2016 2. Herceptin every 3 weeks started 11/08/2015 with anastrozole. Because of decline in the heart ejection fraction, Herceptin was on hold and then resumed, completed February 16, 2017 3. Current treatment: Anastrozole restarted 03/13/2016 stopped in January 2018, started letrozole 11/23/2016, switched to exemestane June 2018  Back pain: Saw orthopedics who performed MRI of her left hip which revealed L5 vertebral body lesion with possible extraosseous extension Bone scan 08/07/2019: Increase activity with sclerotic bone metastases involving left femoral neck right ninth rib and entire L5 vertebra  Treatment plan: 1.  Biopsy of the L5 vertebra to do prognostic panel and molecular testing 2.  Treatment plan based upon pathology report whether it is HER-2 positive and whether it is ER/PR positive. If it is still estrogen receptor positive then potential plan could be with Faslodex with Ibrance along with Xgeva for bone metastases 3.  I consulted with radiation oncology  for palliative radiation. 4.  I discussed with Deborha Payment, PA at [facility] who cell phone is [phone] who is planning to get a femur CT scan to see if there is any risk for impending fracture. All radiation oncology will be in touch with Pecola Lawless to plan palliative radiation.  Severe anxiety/stress related to the diagnosis: I will prescribe Xanax for her. L5 vertebral body lesion: Started on Decadron 1 mg daily even though there is no obvious cord compression because of the extent of the bone involvement.  Return to clinic after the biopsy to discuss pathology report.    Orders Placed This Encounter  Procedures  . NM PET Image Initial (PI) Skull Base To Thigh    Standing Status:   Future    Standing Expiration Date:   08/11/2020    Order Specific Question:   ** REASON FOR EXAM (FREE TEXT)    Answer:   New bone lesions with H/O breast cancer     Order Specific Question:   If indicated for the ordered procedure, I authorize the administration of a radiopharmaceutical per Radiology protocol    Answer:   Yes    Order Specific Question:   Is the patient pregnant?    Answer:   No    Order Specific Question:   Preferred imaging location?    Answer:   [facility]    Order Specific Question:   Release to patient    Answer:   Immediate    Order Specific Question:   Radiology Contrast Protocol - do NOT remove file path    Answer:   [email or web address]  . CT Biopsy    Standing Status:   Future    Standing Expiration Date:   09/15/2020    Order Specific Question:   Reason for Exam (SYMPTOM  OR DIAGNOSIS REQUIRED)    Answer:   L5 lesion detected on MRI back    Order Specific Question:   Preferred imaging location?    Answer:   [facility]    Order Specific Question:   Is the patient pregnant?    Answer:   No  . CBC with Differential (Cancer Center Only)    Standing Status:   Future    Number of Occurrences:   1    Standing Expiration Date:   08/11/2020  . CMP ([facility] only)    Standing Status:   Future    Number of Occurrences:   1    Standing Expiration Date:   08/11/2020  . [facility]    Standing Status:   Future    Number of Occurrences:   1    Standing Expiration Date:   08/11/2020  . Ambulatory referral to Radiation Oncology    Referral Priority:   Routine    Referral Type:   Consultation    Referral Reason:   Specialty Services Required    Requested Specialty:   Radiation Oncology    Number of Visits Requested:   1   The patient has a good understanding of the overall plan. she agrees with it. she will call with any problems that may develop before the next visit here. Total time spent: 60 mins including face to face time and time spent for planning, charting and co-ordination of care    Harriette Ohara, MD 08/12/19

## 2019-08-13 ENCOUNTER — Other Ambulatory Visit: Payer: Self-pay | Admitting: Radiation Therapy

## 2019-08-15 ENCOUNTER — Other Ambulatory Visit: Payer: Self-pay | Admitting: Radiation Oncology

## 2019-08-15 ENCOUNTER — Ambulatory Visit
Admission: RE | Admit: 2019-08-15 | Discharge: 2019-08-15 | Disposition: A | Discharge status: Home / Self Care | Payer: Self-pay | Source: Ambulatory Visit | Attending: Radiation Oncology | Admitting: Radiation Oncology

## 2019-08-15 DIAGNOSIS — C7951 Secondary malignant neoplasm of bone: Secondary | ICD-10-CM

## 2019-08-18 ENCOUNTER — Ambulatory Visit
Admission: RE | Admit: 2019-08-18 | Discharge: 2019-08-18 | Disposition: A | Discharge status: Home / Self Care | Payer: Commercial Managed Care - PPO | Source: Ambulatory Visit | Attending: Radiation Oncology | Admitting: Radiation Oncology

## 2019-08-18 ENCOUNTER — Encounter: Payer: Self-pay | Admitting: Radiation Oncology

## 2019-08-18 ENCOUNTER — Other Ambulatory Visit: Payer: Self-pay

## 2019-08-18 DIAGNOSIS — F17210 Nicotine dependence, cigarettes, uncomplicated: Secondary | ICD-10-CM | POA: Diagnosis not present

## 2019-08-18 DIAGNOSIS — Z170 Estrogen receptor positive status [ER+]: Secondary | ICD-10-CM | POA: Diagnosis not present

## 2019-08-18 DIAGNOSIS — Z79899 Other long term (current) drug therapy: Secondary | ICD-10-CM | POA: Diagnosis not present

## 2019-08-18 DIAGNOSIS — K219 Gastro-esophageal reflux disease without esophagitis: Secondary | ICD-10-CM | POA: Insufficient documentation

## 2019-08-18 DIAGNOSIS — C7951 Secondary malignant neoplasm of bone: Secondary | ICD-10-CM | POA: Diagnosis not present

## 2019-08-18 DIAGNOSIS — C50912 Malignant neoplasm of unspecified site of left female breast: Secondary | ICD-10-CM | POA: Diagnosis not present

## 2019-08-18 DIAGNOSIS — Z803 Family history of malignant neoplasm of breast: Secondary | ICD-10-CM | POA: Insufficient documentation

## 2019-08-18 DIAGNOSIS — Z9221 Personal history of antineoplastic chemotherapy: Secondary | ICD-10-CM | POA: Insufficient documentation

## 2019-08-18 DIAGNOSIS — M129 Arthropathy, unspecified: Secondary | ICD-10-CM | POA: Insufficient documentation

## 2019-08-18 DIAGNOSIS — Z79810 Long term (current) use of selective estrogen receptor modulators (SERMs): Secondary | ICD-10-CM | POA: Diagnosis not present

## 2019-08-18 DIAGNOSIS — Z510 Encounter for antineoplastic radiation therapy: Secondary | ICD-10-CM | POA: Diagnosis not present

## 2019-08-18 DIAGNOSIS — C50112 Malignant neoplasm of central portion of left female breast: Secondary | ICD-10-CM

## 2019-08-18 DIAGNOSIS — Z923 Personal history of irradiation: Secondary | ICD-10-CM | POA: Diagnosis not present

## 2019-08-18 NOTE — Progress Notes (Signed)
Radiation Oncology         (336) [phone] ________________________________  Initial Outpatient Consultation  Name: Krystal [name] MRN: [id number]  Date: 08/18/2019  DOB: Jul 23, 1966  SN:[id number], [name]  [name], MD   REFERRING PHYSICIAN: [name], MD  DIAGNOSIS: The encounter diagnosis was Osseous metastasis ([place]).  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 female who is accompanied by Due to COVID-19 restrictions. Patient has a history of invasive ductal carcinoma of the left breast that was first diagnosed in 2015, for which she underwent a bilateral mastectomy, and had a recurrence in 2017.  She was treated with radiation therapy directed at the left chest wall, axillary, and supraclavicular region in 2017. Since then, she has been closely followed by Dr. Lindi Adie.  Patient was on Herceptin every three weeks plus Anastrozole from 11/08/2015 - 02/2017. She was also on Exemestane daily beginning in June of 2018 but was switched to Tamoxifen 5 mg daily on 12/18/2017 secondary to side effects.  Most recently, patient had an MRI of lumbar spine for pain in left hip on 08/02/2019, which showed diffuse signal abnormality of the L5 vertebral body with possible extraosseous extension. Bone scan done on 08/07/2019, which showed sclerotic metastatic lesions involving the left femoral neck, right ninth rib, and entire L5 vertebra.  Patient last followed up with Dr. Lindi Adie on 08/12/2019, during which time they discussed biopsy of the L5 vertebra. Further treatment plan will be based upon pathology report.  PREVIOUS RADIATION THERAPY: Yes   Radiation treatment dates:  11/25/2015- 01/14/2016  Site/dose:    1) Left chest wall treated to 45 Gy in 25 fractions  2) Scar boost treated to 18 Gy in 9 fractions    Beams/energy:    1)Conformal / 10X, 15X, 6X 2) En Face / 6 MeV     PAST MEDICAL HISTORY:  Past Medical History:  Diagnosis Date  . Arthritis    shoulders  . BRCA1 positive    BRCA1 [id number]  . Chest wall mass 09/2015  . Complication of anesthesia    respiratory depression/arrest with Dilaudid  . Cough with sputum 09/10/2015   clear sputum, per pt.  . Dental crowns present   . GERD (gastroesophageal reflux disease)    takes OTC meds  . History of breast cancer 2015  . Stuffy and runny nose 09/10/2015   green drainage from nose, per pt.    PAST SURGICAL HISTORY: Past Surgical History:  Procedure Laterality Date  . ABDOMINAL HYSTERECTOMY    . BREAST LUMPECTOMY Left 09/16/2015   Procedure: LEFT BREAST MASS EXCISION;  Surgeon: Rolm Bookbinder, MD;  Location: [facility];  Service: General;  Laterality: Left;  . BREAST RECONSTRUCTION WITH PLACEMENT OF TISSUE EXPANDER AND FLEX HD (ACELLULAR HYDRATED DERMIS) Bilateral 04/16/2014   Procedure: BILATERAL BREAST RECONSTRUCTION WITH PLACEMENT OF TISSUE EXPANDER AND FLEX HD (ACELLULAR HYDRATED DERMIS);  Surgeon: Irene Limbo, MD;  Location: [facility];  Service: Plastics;  Laterality: Bilateral;  . CESAREAN SECTION  1999; 2001; 2005  . CHOLECYSTECTOMY  1995  . COLONOSCOPY    . DEBRIDEMENT AND CLOSURE WOUND Bilateral 05/15/2014   Procedure: DEBRIDEMENT AND CLOSURE OF BILATERAL MASECTOMY INCISIONS WITH BREAST EXPANSION;  Surgeon: Irene Limbo, MD;  Location: [facility];  Service: Plastics;  Laterality: Bilateral;  . LAPAROSCOPIC ASSISTED VAGINAL HYSTERECTOMY N/A  08/13/2014   Procedure: OPEN LAPAROSCOPY, EXPLORATORY LAPAROTOMY, TOTAL ABDOMINAL HYSTERECTOMY WITH BILATERAL SALPINGECTOMY AND BILATERAL OOPHORECTOMY;  Surgeon: Darlyn Chamber, MD;  Location: [facility];  Service: Gynecology;  Laterality: N/A;  . LIPOSUCTION WITH LIPOFILLING Bilateral 11/13/2014   Procedure:  LIPOFILLING TO BILATERAL CHEST ;  Surgeon: Irene Limbo, MD;  Location: [facility];  Service: Plastics;  Laterality: Bilateral;  . MELANOMA EXCISION N/A 04/16/2014   Procedure: WIDE LOCAL EXCISION OF BACK MELANOMA;  Surgeon: Rolm Bookbinder, MD;  Location: [facility];  Service: General;  Laterality: N/A;  . PORT-A-CATH REMOVAL Right 06/14/2017   Procedure: REMOVAL PORT-A-CATH;  Surgeon: Rolm Bookbinder, MD;  Location: [facility];  Service: General;  Laterality: Right;  . PORTACATH PLACEMENT Right 10/21/2015   Procedure: INSERTION PORT-A-CATH WITH ULTRASOUND ;  Surgeon: Rolm Bookbinder, MD;  Location: [facility];  Service: General;  Laterality: Right;  . REMOVAL OF BILATERAL TISSUE EXPANDERS WITH PLACEMENT OF BILATERAL BREAST IMPLANTS Bilateral 11/13/2014   Procedure: REMOVAL OF BILATERAL TISSUE EXPANDERS WITH PLACEMENT OF BILATERAL SILICONE IMPLANTS ;  Surgeon: Irene Limbo, MD;  Location: [facility];  Service: Plastics;  Laterality: Bilateral;  . SIMPLE MASTECTOMY WITH AXILLARY SENTINEL NODE BIOPSY Bilateral 04/16/2014   Procedure: BILATERAL SKIN SPARING  MASTECTOMIES WITH LEFT AXILLARY SENTINEL NODE BIOPSY;  Surgeon: Rolm Bookbinder, MD;  Location: [facility];  Service: General;  Laterality: Bilateral;    FAMILY HISTORY:  Family History  Problem Relation Age of Onset  . Breast cancer Paternal Aunt 5       deceased 40s  . Breast cancer Paternal Grandmother        dx 7s; ? if had 2nd BC in 24s; deceased 108s  . Pancreatic cancer Paternal Uncle 43       smoker; deceased    SOCIAL HISTORY:  Social History   Tobacco Use  . Smoking status: Current Some Day Smoker    Packs/day: 0.00    Years: 0.00    Pack years: 0.00    Last attempt to quit: 04/17/2014    Years since quitting: 5.3  . Smokeless tobacco: Never Used  . Tobacco comment: smokes 1-2 cigs every week  Substance Use Topics  .  Alcohol use: Yes    Comment: occasionally  . Drug use: No    ALLERGIES:  Allergies  Allergen Reactions  . Dilaudid [Hydromorphone] Other (See Comments)    RESPIRATORY DEPRESSION  . Codeine Nausea And Vomiting    MEDICATIONS:  Current Outpatient Medications  Medication Sig Dispense Refill  . acetaminophen (TYLENOL) 500 MG tablet Take 500 mg by mouth every 6 (six) hours as needed. Reported on 02/03/2016    . ALPRAZolam (XANAX) 0.25 MG tablet Take 1 tablet (0.25 mg total) by mouth 2 (two) times daily as needed for anxiety. 60 tablet 2  . dexamethasone (DECADRON) 1 MG tablet Take 1 tablet (1 mg total) by mouth 2 (two) times daily with a meal. 15 tablet 0  . esomeprazole (NEXIUM) 20 MG packet Take by mouth.    . loratadine (CLARITIN) 10 MG tablet Claritin 10 mg tablet  Take 1 tablet every day by oral route.    Marland Kitchen exemestane (AROMASIN) 25 MG tablet TAKE 1 TABLET (25 MG TOTAL) BY MOUTH DAILY AFTER BREAKFAST. (Patient not taking: Reported on 08/18/2019) 90 tablet 3  . tamoxifen (NOLVADEX) 10 MG tablet Take 0.5 tablets (5 mg total) by mouth daily. (Patient not taking: Reported on 08/18/2019) 30  tablet 3   No current facility-administered medications for this encounter.    REVIEW OF SYSTEMS:  A 10+ POINT REVIEW OF SYSTEMS WAS OBTAINED including neurology, dermatology, psychiatry, cardiac, respiratory, lymph, extremities, GI, GU, musculoskeletal, constitutional, reproductive, HEENT.  She reports some pain in the lower back region.  She denies any numbness or tingling in her lower extremities or weakness.  She denies any significant pain in the left thigh area with standing.  She has minimal discomfort along the right lower rib cage area   PHYSICAL EXAM:  height is 5' 4"  (1.626 m) and weight is 248 lb 8 oz (112.7 kg). Her temporal temperature is 98.5 F (36.9 C). Her blood pressure is 126/92 (abnormal) and her pulse is 81. Her respiration is 18 and oxygen saturation is 94%.   General: Alert and  oriented, in no acute distress HEENT: Head is normocephalic. Extraocular movements are intact. Oropharynx is clear. Neck: Neck is supple, no palpable cervical or supraclavicular lymphadenopathy. Heart: Regular in rate and rhythm with no murmurs, rubs, or gallops. Chest: Clear to auscultation bilaterally, with no rhonchi, wheezes, or rales. Abdomen: Soft, nontender, nondistended, with no rigidity or guarding. Extremities: No cyanosis or edema. Lymphatics: see Neck Exam Skin: No concerning lesions. Musculoskeletal: symmetric strength and muscle tone throughout. Neurologic: Cranial nerves II through XII are grossly intact. No obvious focalities. Speech is fluent. Coordination is intact. Psychiatric: Judgment and insight are intact. Affect is appropriate.   ECOG = 1  0 - Asymptomatic (Fully active, able to carry on all predisease activities without restriction)  1 - Symptomatic but completely ambulatory (Restricted in physically strenuous activity but ambulatory and able to carry out work of a light or sedentary nature. For example, light housework, office work)  2 - Symptomatic, <50% in bed during the day (Ambulatory and capable of all self care but unable to carry out any work activities. Up and about more than 50% of waking hours)  3 - Symptomatic, >50% in bed, but not bedbound (Capable of only limited self-care, confined to bed or chair 50% or more of waking hours)  4 - Bedbound (Completely disabled. Cannot carry on any self-care. Totally confined to bed or chair)  5 - Death   Eustace Pen MM, Creech RH, Tormey DC, et al. [phone]). "Toxicity and response criteria of the [facility]". [place] Oncol. 5 (6): 649-55  LABORATORY DATA:  Lab Results  Component Value Date   WBC 7.7 08/12/2019   HGB 14.9 08/12/2019   HCT 45.3 08/12/2019   MCV 90.4 08/12/2019   PLT 297 08/12/2019   NEUTROABS 5.6 08/12/2019   Lab Results  Component Value Date   NA 142 08/12/2019   K  4.4 08/12/2019   CL 103 08/12/2019   CO2 27 08/12/2019   GLUCOSE 104 (H) 08/12/2019   CREATININE 0.82 08/12/2019   CALCIUM 9.3 08/12/2019      RADIOGRAPHY: No results found.    IMPRESSION: Metastatic breast cancer with osseous metastases  Patient is symptomatic from her disease in the L5 area and would be a good candidate for palliative radiation therapy directed this area.  Patient also has a lesion in the left femoral neck which she has minimal discomfort in this area.  Given the location would also recommend radiation therapy to this area.  She reports having a CT scan that was of left femur region but she is unaware that she will require surgical intervention.   We discussed the general course of treatment side  effects and potential toxicities of palliative radiation therapy with 2 treatment areas.  She appears to understand and wishes to proceed with treatment.  PLAN: Patient is scheduled for CT simulation later today.  She will begin her radiation treatments later this week.  She will also undergo a PET scan later this week for further evaluation and some point will likely undergo biopsy of the L5 area for further guidance on targeting her systemic treatment.    ------------------------------------------------  Blair Promise, PhD, MD  This document serves as a record of services personally performed by [name], MD. It was created on his behalf by Clerance Lav, a trained medical scribe. The creation of this record is based on the scribe's personal observations and the provider's statements to them. This document has been checked and approved by the attending provider.

## 2019-08-18 NOTE — Progress Notes (Signed)
  Radiation Oncology         [phone] ________________________________  Name: Yajayra Coon MRN: [id number]  Date: 08/18/2019  DOB: Jul 07, 1966  SIMULATION AND TREATMENT PLANNING NOTE    ICD-10-CM   1. Osseous metastasis (H[facility])  C79.51     DIAGNOSIS: Metastatic breast cancer with osseous metastases  NARRATIVE:  The patient was brought to the [facility].  Identity was confirmed.  All relevant records and images related to the planned course of therapy were reviewed.  The patient freely provided informed written consent to proceed with treatment after reviewing the details related to the planned course of therapy. The consent form was witnessed and verified by the simulation staff.  Then, the patient was set-up in a stable reproducible  supine position for radiation therapy.  CT images were obtained.  Surface markings were placed.  The CT images were loaded into the planning software.  Then the target and avoidance structures were contoured.  Treatment planning then occurred.  The radiation prescription was entered and confirmed.  Then, I designed and supervised the construction of a total of 8 medically necessary complex treatment devices.  I have requested : 3D Simulation  I have requested a DVH of the following structures: GTV, spinal cord, bowel.  I have ordered:dose calc.  PLAN:  The patient will receive 30 Gy in 10 fractions directed at the L5 vertebral body and the proximal left femur.  The patient will be treated with 2 separate isocenters with  2 separate radiation plans.  -----------------------------------  Blair Promise, PhD, MD  This document serves as a record of services personally performed by [name], MD. It was created on his behalf by Clerance Lav, a trained medical scribe. The creation of this record is based on the scribe's personal observations and the provider's statements to them. This document has been checked and approved by the attending  provider.

## 2019-08-18 NOTE — Patient Instructions (Signed)
Coronavirus (COVID-19) Are you at risk?  Are you at risk for the Coronavirus (COVID-19)?  To be considered HIGH RISK for Coronavirus (COVID-19), you have to meet the following criteria:  . Traveled to [place], [place], [place], [place] or [place]; or in the [place] to [place], [place], [place], or [place]; and have fever, cough, and shortness of breath within the last 2 weeks of travel OR . Been in close contact with a person diagnosed with COVID-19 within the last 2 weeks and have fever, cough, and shortness of breath . IF YOU DO NOT MEET THESE CRITERIA, YOU ARE CONSIDERED LOW RISK FOR COVID-19.  What to do if you are HIGH RISK for COVID-19?  . If you are having a medical emergency, call 911. . Seek medical care right away. Before you go to a doctor's office, urgent care or emergency department, call ahead and tell them about your recent travel, contact with someone diagnosed with COVID-19, and your symptoms. You should receive instructions from your physician's office regarding next steps of care.  . When you arrive at healthcare provider, tell the healthcare staff immediately you have returned from visiting [place], [place], [place], [place] or [place]; or traveled in the [place] to [place], [place], [place], or [place]; in the last two weeks or you have been in close contact with a person diagnosed with COVID-19 in the last 2 weeks.   . Tell the health care staff about your symptoms: fever, cough and shortness of breath. . After you have been seen by a medical provider, you will be either: o Tested for (COVID-19) and discharged home on quarantine except to seek medical care if symptoms worsen, and asked to  - Stay home and avoid contact with others until you get your results (4-5 days)  - Avoid travel on public transportation if possible (such as bus, train, or airplane) or o Sent to the Emergency Department by EMS for evaluation, COVID-19 testing, and possible  admission depending on your condition and test results.  What to do if you are LOW RISK for COVID-19?  Reduce your risk of any infection by using the same precautions used for avoiding the common cold or flu:  . Wash your hands often with soap and warm water for at least 20 seconds.  If soap and water are not readily available, use an alcohol-based hand sanitizer with at least 60% alcohol.  . If coughing or sneezing, cover your mouth and nose by coughing or sneezing into the elbow areas of your shirt or coat, into a tissue or into your sleeve (not your hands). . Avoid shaking hands with others and consider head nods or verbal greetings only. . Avoid touching your eyes, nose, or mouth with unwashed hands.  . Avoid close contact with people who are sick. . Avoid places or events with large numbers of people in one location, like concerts or sporting events. . Carefully consider travel plans you have or are making. . If you are planning any travel outside or inside the [place], visit the CDC's Travelers' Health webpage for the latest health notices. . If you have some symptoms but not all symptoms, continue to monitor at home and seek medical attention if your symptoms worsen. . If you are having a medical emergency, call 911.   ADDITIONAL HEALTHCARE OPTIONS FOR PATIENTS  [facility] / e-Visit: [email or web address]         [facility]: [phone]  [facility]: [phone]                   [facility]: [phone]   

## 2019-08-18 NOTE — Progress Notes (Signed)
Histology and Location of Primary Cancer: Cancer of central portion of left female breast  Location(s) of Symptomatic tumor(s): Back pain: Saw orthopedics who performed MRI of her left hip which revealed L5 vertebral body lesion with possible extraosseous extension Bone scan 08/07/2019: Increase activity with sclerotic bone metastases involving left femoral neck right ninth rib and entire L5 vertebra  Past/Anticipated chemotherapy by medical oncology, if any: Per Dr. Lindi Adie 08/12/19: Treatment plan: 1.  Biopsy of the L5 vertebra to do prognostic panel and molecular testing 2.  Treatment plan based upon pathology report whether it is HER-2 positive and whether it is ER/PR positive. If it is still estrogen receptor positive then potential plan could be with Faslodex with Ibrance along with Xgeva for bone metastases 3.  I consulted with radiation oncology for palliative radiation. 4.  I discussed with Deborha Payment, PA at [facility] who cell phone is [phone] who is planning to get a femur CT scan to see if there is any risk for impending fracture. All radiation oncology will be in touch with Pecola Lawless to plan palliative radiation.  Severe anxiety/stress related to the diagnosis: I will prescribe Xanax for her. L5 vertebral body lesion: Started on Decadron 1 mg daily even though there is no obvious cord compression because of the extent of the bone involvement.   Pain on a scale of 0-10 is: Pt denies c/o pain.   If Spine Met(s), symptoms, if any, include:  Bowel/Bladder retention or incontinence (please describe): Pt denies  Numbness or weakness in extremities (please describe): Pt denies  Current Decadron regimen, if applicable: decadron 66m BID  Ambulatory status? Walker? Wheelchair?: steady gait without assistive device.  SAFETY ISSUES: Prior radiation? Radiation treatment dates:   11/25/15- 01/14/16  Site/dose:    1) Left chest wall treated to 45 Gy in 25 fractions   2)  Scar boost treated to 18 Gy in 9 fractions   Pacemaker/ICD? No  Possible current pregnancy? no  Is the patient on methotrexate? no  Additional Complaints / other details:  Pt presents today for consult with Dr. KSondra Comefor Radiation Oncology.   BP (!) 126/92 (BP Location: Right Arm, Patient Position: Sitting)   Pulse 81   Temp 98.5 F (36.9 C) (Temporal)   Resp 18   Ht 5' 4"  (1.626 m)   Wt 248 lb 8 oz (112.7 kg)   LMP 07/06/2014 (Within Weeks) Comment: bleeding since november  SpO2 94%   BMI 42.65 kg/m   Wt Readings from Last 3 Encounters:  08/18/19 248 lb 8 oz (112.7 kg)  08/12/19 249 lb 6.4 oz (113.1 kg)  12/18/17 266 lb 14.4 oz (121.1 kg)   JLoma Sousa RN BSN

## 2019-08-19 ENCOUNTER — Other Ambulatory Visit: Payer: Self-pay

## 2019-08-19 ENCOUNTER — Inpatient Hospital Stay: Payer: Commercial Managed Care - PPO

## 2019-08-19 ENCOUNTER — Encounter: Payer: Self-pay | Admitting: *Deleted

## 2019-08-19 NOTE — Progress Notes (Signed)
[facility] Work  Clinical Social Work met with patient as "walk in" visit today.  The patient reported feeling overwhelmed, tearful, and hopeless.  CSW explored patient emotions and discussed themes that were shared through session including living with uncertainty, loss of sense of control, and feelings of hopelessness after cancer recurrence.  Krystal [name] has no thoughts of SI/HI, she reported her children are her sense of purpose. CSW discussed resources available to support patient through her experience such as counseling, support groups, and [employer].  She indicated difficulty sleeping due to intrusive, racing thoughts.  CSW discussed strategies for thought regulation, also provided patient with meditation CD, encouraged her to utilize before bed or when she is having difficulty calming her mind at night.  CSW will make referral to counseling intern for ongoing counseling services.  Gwinda Maine, LCSW  Clinical Social Worker [facility]

## 2019-08-20 DIAGNOSIS — Z510 Encounter for antineoplastic radiation therapy: Secondary | ICD-10-CM | POA: Insufficient documentation

## 2019-08-20 DIAGNOSIS — C7951 Secondary malignant neoplasm of bone: Secondary | ICD-10-CM | POA: Diagnosis present

## 2019-08-20 DIAGNOSIS — C50112 Malignant neoplasm of central portion of left female breast: Secondary | ICD-10-CM | POA: Diagnosis not present

## 2019-08-20 DIAGNOSIS — Z170 Estrogen receptor positive status [ER+]: Secondary | ICD-10-CM | POA: Insufficient documentation

## 2019-08-21 ENCOUNTER — Encounter (HOSPITAL_COMMUNITY): Payer: Self-pay | Admitting: Radiology

## 2019-08-21 ENCOUNTER — Ambulatory Visit
Admission: RE | Admit: 2019-08-21 | Discharge: 2019-08-21 | Disposition: A | Discharge status: Home / Self Care | Payer: Commercial Managed Care - PPO | Source: Ambulatory Visit | Attending: Radiation Oncology | Admitting: Radiation Oncology

## 2019-08-21 DIAGNOSIS — C7951 Secondary malignant neoplasm of bone: Secondary | ICD-10-CM | POA: Diagnosis not present

## 2019-08-21 NOTE — Progress Notes (Signed)
  Radiation Oncology         [phone] ________________________________  Name: Krystal [name] MRN: [id number]  Date: 08/21/2019  DOB: 01-09-1966  Simulation Verification Note    ICD-10-CM   1. Osseous metastasis (HCC)  C79.51     Status: outpatient  NARRATIVE: The patient was brought to the treatment unit and placed in the planned treatment position. The clinical setup was verified. Then port films were obtained and uploaded to the radiation oncology medical record software.  The treatment beams were carefully compared against the planned radiation fields. The position location and shape of the radiation fields was reviewed. They targeted volume of tissue appears to be appropriately covered by the radiation beams. Organs at risk appear to be excluded as planned.  Based on my personal review, I approved the simulation verification. The patient's treatment will proceed as planned.  -----------------------------------  Blair Promise, PhD, MD

## 2019-08-21 NOTE — Progress Notes (Signed)
Krystal [name] Female, 54 y.o., 10-24-1965 MRN:  [id number] Phone:  [phone] (M) ... PCP:  Patient, [name] Coverage:  [employer] Ppo Next Appt With Radiology ([facility] PET) 08/22/2019 at 10:00 AM  RE: CT Biopsy Received: 2 days ago Message Contents  Sandi Mariscal, MD  Garth Bigness D  Please re submit back to the IR group once the PET CT is completed.   If L5 is the only lesion AND symptomatic, would discuss with Dr. Lindi Adie the appropriateness of proceeding with image guided L5 osteocool and Bx.   Cathren Harsh       Previous Messages   ----- Message -----  From: Garth Bigness D  Sent: 08/19/2019  4:11 PM EST  To: Ir Procedure Requests  Subject: CT Biopsy                     Procedure:  CT Biopsy   Reason: Malignant neoplasm of central portion of left breast in female, estrogen receptor positive, Metastatic breast cancer, Bone metastasis, L5 lesion detected on MRI back   History: Outside imaging uploaded in system, NM PET scheduled for 08/22/19   Dr. Lindi Adie, Loleta Dicker  [phone]

## 2019-08-22 ENCOUNTER — Encounter (HOSPITAL_COMMUNITY)
Admission: RE | Admit: 2019-08-22 | Discharge: 2019-08-22 | Disposition: A | Discharge status: Home / Self Care | Payer: Commercial Managed Care - PPO | Source: Ambulatory Visit | Attending: Hematology and Oncology | Admitting: Hematology and Oncology

## 2019-08-22 ENCOUNTER — Encounter: Payer: Self-pay | Admitting: General Practice

## 2019-08-22 ENCOUNTER — Ambulatory Visit
Admission: RE | Admit: 2019-08-22 | Discharge: 2019-08-22 | Disposition: A | Discharge status: Home / Self Care | Payer: Commercial Managed Care - PPO | Source: Ambulatory Visit | Attending: Internal Medicine | Admitting: Internal Medicine

## 2019-08-22 ENCOUNTER — Ambulatory Visit
Admission: RE | Admit: 2019-08-22 | Discharge: 2019-08-22 | Disposition: A | Discharge status: Home / Self Care | Payer: Commercial Managed Care - PPO | Source: Ambulatory Visit | Attending: Radiation Oncology | Admitting: Radiation Oncology

## 2019-08-22 ENCOUNTER — Other Ambulatory Visit: Payer: Self-pay

## 2019-08-22 DIAGNOSIS — C50112 Malignant neoplasm of central portion of left female breast: Secondary | ICD-10-CM | POA: Insufficient documentation

## 2019-08-22 DIAGNOSIS — C7951 Secondary malignant neoplasm of bone: Secondary | ICD-10-CM | POA: Insufficient documentation

## 2019-08-22 DIAGNOSIS — C50919 Malignant neoplasm of unspecified site of unspecified female breast: Secondary | ICD-10-CM | POA: Insufficient documentation

## 2019-08-22 DIAGNOSIS — Z170 Estrogen receptor positive status [ER+]: Secondary | ICD-10-CM

## 2019-08-22 LAB — GLUCOSE, CAPILLARY: Glucose-Capillary: 98 mg/dL (ref 70–99)

## 2019-08-22 MED ORDER — FLUDEOXYGLUCOSE F - 18 (FDG) INJECTION
12.4000 | Freq: Once | INTRAVENOUS | Status: AC
  Administered 2019-08-22: 12.4 via INTRAVENOUS

## 2019-08-22 MED ORDER — HYDROCODONE-ACETAMINOPHEN 10-325 MG PO TABS: 1.0000 | ORAL_TABLET | Freq: Three times a day (TID) | ORAL | 0 refills | Status: DC | PRN

## 2019-08-22 MED ORDER — TRAZODONE HCL 50 MG PO TABS: 50.0000 mg | ORAL_TABLET | Freq: Every evening | ORAL | 3 refills | Status: DC | PRN

## 2019-08-22 NOTE — Consult Note (Signed)
Chart reviewedPatient seen in radiation oncology clinic along w/ Lorrin Jackson for spiritual care support.

## 2019-08-22 NOTE — Progress Notes (Signed)
[facility] Spiritual Care Note  Made a co-visit with Dr Kara Dies Care. Krystal [name] shared emotionally about her reaction to her recent diagnosis and history, identifying factors in her emotional history that contribute to her high distress. Provided pastoral presence, reflective listening, emotional support, and normalization of feelings. Fear of dying and what that would mean for her family is understandably a top concern. One important goal for the future is to die at home, not in a hospital. We talked about strategies for coping with insomnia, talking back to negative messages she has heard, and ways to focus on being present in the moment (where she does have some control, unlike the future). We plan for me to follow up by phone on Tuesday, which will allow her overnight to process her 1/18 appointment with Dr Lindi Adie to address PET results and treatment plan.   [place], [place], [facility] Pager [phone] Voicemail [phone]

## 2019-08-24 NOTE — Progress Notes (Signed)
Patient Care Team: Patient, [name] [name], MD as Consulting Physician (Hematology and Oncology) [name], MD as Consulting Physician (Radiation Oncology) [name], NP as Nurse Practitioner (Hematology and Oncology) Rolm Bookbinder, MD as Consulting Physician (General Surgery)  DIAGNOSIS:    ICD-10-CM   1. BRCA1 positive  Z15.01    Z15.09   2. Malignant neoplasm of central portion of left breast in female, estrogen receptor positive ([place])  C50.112    Z17.0   3. Metastatic breast cancer ([place])  C50.919     SUMMARY OF ONCOLOGIC HISTORY: Oncology History  Cancer of central portion of left female breast ([place])  04/16/2014 Surgery   Bilateral mastectomies: Left breast : Multifocal invasive ductal carcinoma positive for lymphovascular invasion, 1.2 cm and 0.6 cm, grade 3, high-grade DCIS with comedonecrosis, 4 SLN negative T1 C. N0 M0 stage IA: Oncotype DX 13 (ROR 9%)   04/16/2014 Surgery   Left upper back melanoma resected no residual melanoma identified on re-resection margins negative   05/26/2014 - 06/08/2015 Anti-estrogen oral therapy   Tamoxifen 20 mg daily with a plan to switch her to aromatase inhibitors once Krystal [name] gets oophorectomy ( patient did not continue antiestrogen therapy by choice)   08/13/2014 Surgery   oophorectomy with total abdominal hysterectomy   09/08/2015 -  Anti-estrogen oral therapy   Resumed anastrozole after Krystal [name] found recurrence of breast cancer; stopped in January 2018, started letrozole 2.5 mg daily 10/12/2016; switched to Exemestane 25 mg daily on 01/03/17   09/16/2015 Surgery   Skin, left: IDC grade 3; 0.5 cm, margins negative, ER 95%, PR 10%, HER-2 positive ratio 2.08   11/08/2015 - 02/2017 Chemotherapy   Herceptin every 3 weeks plus anastrozole   11/24/2015 - 01/14/2016 Radiation Therapy   Adjuvant radiation therapy by Dr. Sondra Come   01/2017 -  Anti-estrogen oral therapy   Exemestane daily  (stopped others due to side effects), due to muscle aches and pains switch to tamoxifen 5 mg daily starting 12/18/2017   08/07/2019 Relapse/Recurrence   Bone scan: Sclerotic metastatic lesions involving left femoral neck, right ninth rib and entire L5 vertebra; MRI left hip: Diffuse signal abnormality L5 vertebral body with possible extraosseous extension     CHIEF COMPLIANT: Follow-up to review scan and discuss treatment plan  INTERVAL HISTORY: Krystal [name] is a 54 y.o. with above-mentioned history of metastatic breast cancer with osseous metastases. PET scan on 08/22/19 showed multiple pulmonary nodules and hypermetabolic lymph nodes in addition to known osseous metastatic disease. Krystal [name] presents to the clinic today to review her scan and discuss further treatment.   ALLERGIES:  is allergic to dilaudid [hydromorphone] and codeine.  MEDICATIONS:  Current Outpatient Medications  Medication Sig Dispense Refill  . acetaminophen (TYLENOL) 500 MG tablet Take 500 mg by mouth every 6 (six) hours as needed. Reported on 02/03/2016    . ALPRAZolam (XANAX) 0.25 MG tablet Take 1 tablet (0.25 mg total) by mouth 2 (two) times daily as needed for anxiety. 60 tablet 2  . dexamethasone (DECADRON) 1 MG tablet Take 1 tablet (1 mg total) by mouth 2 (two) times daily with a meal. 15 tablet 0  . esomeprazole (NEXIUM) 20 MG packet Take by mouth.    Marland Kitchen exemestane (AROMASIN) 25 MG tablet TAKE 1 TABLET (25 MG TOTAL) BY MOUTH DAILY AFTER BREAKFAST. (Patient not taking: Reported on 08/18/2019) 90 tablet 3  . HYDROcodone-acetaminophen (NORCO) 10-325 MG tablet Take 1 tablet by mouth every 8 (eight)  hours as needed. Palliative Care 60 tablet 0  . loratadine (CLARITIN) 10 MG tablet Claritin 10 mg tablet  Take 1 tablet every day by oral route.    . tamoxifen (NOLVADEX) 10 MG tablet Take 0.5 tablets (5 mg total) by mouth daily. (Patient not taking: Reported on 08/18/2019) 30 tablet 3  . traZODone (DESYREL) 50 MG tablet Take 1  tablet (50 mg total) by mouth at bedtime as needed for sleep. 30 tablet 3   No current facility-administered medications for this visit.    PHYSICAL EXAMINATION: ECOG PERFORMANCE STATUS: 1 - Symptomatic but completely ambulatory  Vitals:   08/25/19 1057  BP: 124/79  Pulse: 78  Resp: 18  Temp: 98.5 F (36.9 C)  SpO2: 96%   Filed Weights   08/25/19 1057  Weight: 248 lb 14.4 oz (112.9 kg)    LABORATORY DATA:  I have reviewed the data as listed CMP Latest Ref Rng & Units 08/12/2019 02/16/2017 01/03/2017  Glucose 70 - 99 mg/dL 104(H) 190(H) 100  BUN 6 - 20 mg/dL 14 7.9 10.2  Creatinine 0.44 - 1.00 mg/dL 0.82 0.8 0.8  Sodium 135 - 145 mmol/L 142 142 141  Potassium 3.5 - 5.1 mmol/L 4.4 4.3 4.0  Chloride 98 - 111 mmol/L 103 - -  CO2 22 - 32 mmol/L 27 27 28   Calcium 8.9 - 10.3 mg/dL 9.3 9.4 9.5  Total Protein 6.5 - 8.1 g/dL 7.7 7.4 7.6  Total Bilirubin 0.3 - 1.2 mg/dL 0.4 0.41 0.52  Alkaline Phos 38 - 126 U/L 81 88 97  AST 15 - 41 U/L 18 27 29   ALT 0 - 44 U/L 29 50 47    Lab Results  Component Value Date   WBC 7.7 08/12/2019   HGB 14.9 08/12/2019   HCT 45.3 08/12/2019   MCV 90.4 08/12/2019   PLT 297 08/12/2019   NEUTROABS 5.6 08/12/2019    ASSESSMENT & PLAN:  Metastatic breast cancer (HCC) Back pain: Saw orthopedics who performed MRI of her left hip which revealed L5 vertebral body lesion with possible extraosseous extension Bone scan 08/07/2019: Increase activity with sclerotic bone metastases involving left femoral neck right ninth rib and entire L5 vertebra  08/22/2019: PET CT scan: Metastatic disease in the chest with multiple lung nodules all subcentimeter size and hypermetabolic lymph nodes (right paratracheal node 7 mm, subcarinal node 1 cm, right hilar node 6 mm), retroperitoneal and upper pelvic common iliac lymph nodes, bone metastases especially L5, L4, T6, right 10th rib, left proximal femur  Treatment plan: 1.  Biopsy of the L5 vertebra to do prognostic panel  and molecular testing 2.  Treatment plan based upon pathology report whether it is HER-2 positive and whether it is ER/PR positive. If it is still estrogen receptor positive then potential plan could be with Faslodex with Ibrance along with Xgeva for bone metastases 3.  palliative radiation started by Dr. Sondra Come  I discussed with interventional radiology Dr. Kathlene Cote who did not think there would be a good place to get a tissue biopsy.  The L5 is going to rockhard and also radiation may viable tissue. Therefore we will get guardant 360 to look for actionable receptors and mutations.  Severe emotional distress: On Xanax.  Krystal [name] has seen palliative care as well as chaplain.  Current treatment: Palliative radiation, I recommended starting letrozole 2.5 mg daily. Previously Krystal [name] could not tolerate it but we hope that Krystal [name] will tolerate it now. Once we have the molecular test done we will be  able to know whether Krystal [name] is ER/PR positive and also about HER-2 receptors.  I would like to see her on the last day of radiation for follow-up.  No orders of the defined types were placed in this encounter.  The patient has a good understanding of the overall plan. Krystal [name] agrees with it. Krystal [name] will call with any problems that may develop before the next visit here.  Total time spent: 30 mins including face to face time and time spent for planning, charting and coordination of care  [name], MD 08/25/2019  I, Cloyde Reams Dorshimer, am acting as scribe for Dr. [name].  I have reviewed the above documentation for accuracy and completeness, and I agree with the above.

## 2019-08-25 ENCOUNTER — Other Ambulatory Visit: Payer: Self-pay

## 2019-08-25 ENCOUNTER — Other Ambulatory Visit: Payer: Self-pay | Admitting: *Deleted

## 2019-08-25 ENCOUNTER — Telehealth: Payer: Self-pay | Admitting: Hematology and Oncology

## 2019-08-25 ENCOUNTER — Inpatient Hospital Stay: Payer: Commercial Managed Care - PPO

## 2019-08-25 ENCOUNTER — Inpatient Hospital Stay: Payer: Commercial Managed Care - PPO | Admitting: Hematology and Oncology

## 2019-08-25 ENCOUNTER — Ambulatory Visit
Admission: RE | Admit: 2019-08-25 | Discharge: 2019-08-25 | Disposition: A | Discharge status: Home / Self Care | Payer: Commercial Managed Care - PPO | Source: Ambulatory Visit | Attending: Radiation Oncology | Admitting: Radiation Oncology

## 2019-08-25 VITALS — BP 124/79 | HR 78 | Temp 98.5°F | Resp 18 | Ht 64.0 in | Wt 248.9 lb

## 2019-08-25 DIAGNOSIS — Z170 Estrogen receptor positive status [ER+]: Secondary | ICD-10-CM

## 2019-08-25 DIAGNOSIS — C50112 Malignant neoplasm of central portion of left female breast: Secondary | ICD-10-CM

## 2019-08-25 DIAGNOSIS — Z1501 Genetic susceptibility to malignant neoplasm of breast: Secondary | ICD-10-CM | POA: Diagnosis not present

## 2019-08-25 DIAGNOSIS — C50919 Malignant neoplasm of unspecified site of unspecified female breast: Secondary | ICD-10-CM

## 2019-08-25 DIAGNOSIS — Z1509 Genetic susceptibility to other malignant neoplasm: Secondary | ICD-10-CM

## 2019-08-25 DIAGNOSIS — C7951 Secondary malignant neoplasm of bone: Secondary | ICD-10-CM | POA: Diagnosis not present

## 2019-08-25 LAB — CMP (CANCER CENTER ONLY)
ALT: 21 U/L (ref 0–44)
AST: 12 U/L — ABNORMAL LOW (ref 15–41)
Albumin: 4 g/dL (ref 3.5–5.0)
Alkaline Phosphatase: 64 U/L (ref 38–126)
Anion gap: 11 (ref 5–15)
BUN: 9 mg/dL (ref 6–20)
CO2: 27 mmol/L (ref 22–32)
Calcium: 8.7 mg/dL — ABNORMAL LOW (ref 8.9–10.3)
Chloride: 103 mmol/L (ref 98–111)
Creatinine: 0.8 mg/dL (ref 0.44–1.00)
GFR, Est AFR Am: >60 mL/min (ref >60)
GFR, Estimated: >60 mL/min (ref >60)
Glucose, Bld: 109 mg/dL — ABNORMAL HIGH (ref 70–99)
Potassium: 4.2 mmol/L (ref 3.5–5.1)
Sodium: 141 mmol/L (ref 135–145)
Total Bilirubin: 0.4 mg/dL (ref 0.3–1.2)
Total Protein: 7.1 g/dL (ref 6.5–8.1)

## 2019-08-25 LAB — CBC WITH DIFFERENTIAL (CANCER CENTER ONLY)
Abs Immature Granulocytes: 0.01 10*3/uL (ref 0.00–0.07)
Basophils Absolute: 0 10*3/uL (ref 0.0–0.1)
Basophils Relative: 1 %
Eosinophils Absolute: 0.1 10*3/uL (ref 0.0–0.5)
Eosinophils Relative: 1 %
HCT: 42.6 % (ref 36.0–46.0)
Hemoglobin: 14.1 g/dL (ref 12.0–15.0)
Immature Granulocytes: 0 %
Lymphocytes Relative: 21 %
Lymphs Abs: 1.2 10*3/uL (ref 0.7–4.0)
MCH: 29.8 pg (ref 26.0–34.0)
MCHC: 33.1 g/dL (ref 30.0–36.0)
MCV: 90.1 fL (ref 80.0–100.0)
Monocytes Absolute: 0.3 10*3/uL (ref 0.1–1.0)
Monocytes Relative: 6 %
Neutro Abs: 4.2 10*3/uL (ref 1.7–7.7)
Neutrophils Relative %: 71 %
Platelet Count: 297 10*3/uL (ref 150–400)
RBC: 4.73 MIL/uL (ref 3.87–5.11)
RDW: 13.5 % (ref 11.5–15.5)
WBC Count: 5.8 10*3/uL (ref 4.0–10.5)
nRBC: 0 % (ref 0.0–0.2)

## 2019-08-25 MED ORDER — CALCIUM CARBONATE-VITAMIN D 500-200 MG-UNIT PO TABS: 1.0000 | ORAL_TABLET | Freq: Two times a day (BID) | ORAL | 3 refills | Status: DC

## 2019-08-25 MED ORDER — LETROZOLE 2.5 MG PO TABS: 2.5000 mg | ORAL_TABLET | Freq: Every day | ORAL | 3 refills | Status: DC

## 2019-08-25 NOTE — Telephone Encounter (Signed)
Patient called back to get 1/28 appt

## 2019-08-25 NOTE — Assessment & Plan Note (Signed)
Back pain: Saw orthopedics who performed MRI of her left hip which revealed L5 vertebral body lesion with possible extraosseous extension Bone scan 08/07/2019: Increase activity with sclerotic bone metastases involving left femoral neck right ninth rib and entire L5 vertebra  08/22/2019: PET CT scan: Metastatic disease in the chest with multiple lung nodules all subcentimeter size and hypermetabolic lymph nodes (right paratracheal node 7 mm, subcarinal node 1 cm, right hilar node 6 mm), retroperitoneal and upper pelvic common iliac lymph nodes, bone metastases especially L5, L4, T6, right 10th rib, left proximal femur  Treatment plan: 1.  Biopsy of the L5 vertebra to do prognostic panel and molecular testing 2.  Treatment plan based upon pathology report whether it is HER-2 positive and whether it is ER/PR positive. If it is still estrogen receptor positive then potential plan could be with Faslodex with Ibrance along with Xgeva for bone metastases 3.  palliative radiation started by Dr. Sondra Come  Severe emotional distress: On Xanax.  She has seen palliative care as well as chaplain.

## 2019-08-25 NOTE — Progress Notes (Signed)
Called patient on 08/25/2019 via phone to offer counseling services for emotional support. Pt did not answer and there was no voicemail box. Counseling intern will call patient again on Wednesday.  Art Buff [place] Counseling Intern Voicemail:  [phone]

## 2019-08-25 NOTE — Telephone Encounter (Signed)
I could not reach patient regarding schedule  °

## 2019-08-26 ENCOUNTER — Other Ambulatory Visit: Payer: Self-pay

## 2019-08-26 ENCOUNTER — Ambulatory Visit
Admission: RE | Admit: 2019-08-26 | Discharge: 2019-08-26 | Disposition: A | Discharge status: Home / Self Care | Payer: Commercial Managed Care - PPO | Source: Ambulatory Visit | Attending: Radiation Oncology | Admitting: Radiation Oncology

## 2019-08-26 ENCOUNTER — Encounter: Payer: Self-pay | Admitting: General Practice

## 2019-08-26 DIAGNOSIS — C7951 Secondary malignant neoplasm of bone: Secondary | ICD-10-CM | POA: Diagnosis not present

## 2019-08-26 NOTE — Progress Notes (Signed)
[place] Spiritual Care Note  Followed up with Krystal [name] by phone as planned to make space for her to share and process her thoughts, feelings, and next steps regarding her PET scan results that Dr Lindi Adie reviewed with her yesterday. Krystal [name] finds it helpful to have a conversation partner who is familiar with oncology and separate from family/social circles. She is working through the implications of her diagnosis and hopes for treatment, and she plans to reach out to me when she is ready to talk again. She also welcomes a call from [employer] and plans to set up her voicemail tonight to be able to receive messages from [facility]. Following for support, but please also page if needs arise or circumstances change. Thank you.   [place], [place], [facility] Pager [phone] Voicemail [phone]

## 2019-08-27 ENCOUNTER — Other Ambulatory Visit: Payer: Self-pay

## 2019-08-27 ENCOUNTER — Other Ambulatory Visit: Payer: Self-pay | Admitting: Radiation Oncology

## 2019-08-27 ENCOUNTER — Ambulatory Visit
Admission: RE | Admit: 2019-08-27 | Discharge: 2019-08-27 | Disposition: A | Discharge status: Home / Self Care | Payer: Commercial Managed Care - PPO | Source: Ambulatory Visit | Attending: Radiation Oncology | Admitting: Radiation Oncology

## 2019-08-27 DIAGNOSIS — C7951 Secondary malignant neoplasm of bone: Secondary | ICD-10-CM | POA: Diagnosis not present

## 2019-08-27 MED ORDER — ONDANSETRON HCL 4 MG PO TABS: 4.0000 mg | ORAL_TABLET | Freq: Three times a day (TID) | ORAL | 1 refills | Status: DC | PRN

## 2019-08-27 NOTE — Progress Notes (Signed)
Counseling intern spoke with patient on 08/27/19 via phone to schedule an intake counseling appointment for Friday, August 29, 2019 at 3:45pm via phone. Counseling intern will call patient to initiate the phone call.   Art Buff [place] Counseling Intern Voicemail:  [phone]

## 2019-08-28 ENCOUNTER — Other Ambulatory Visit: Payer: Self-pay

## 2019-08-28 ENCOUNTER — Ambulatory Visit
Admission: RE | Admit: 2019-08-28 | Discharge: 2019-08-28 | Disposition: A | Discharge status: Home / Self Care | Payer: Commercial Managed Care - PPO | Source: Ambulatory Visit | Attending: Radiation Oncology | Admitting: Radiation Oncology

## 2019-08-28 DIAGNOSIS — C7951 Secondary malignant neoplasm of bone: Secondary | ICD-10-CM | POA: Diagnosis not present

## 2019-08-29 ENCOUNTER — Ambulatory Visit
Admission: RE | Admit: 2019-08-29 | Discharge: 2019-08-29 | Disposition: A | Discharge status: Home / Self Care | Payer: Commercial Managed Care - PPO | Source: Ambulatory Visit | Attending: Radiation Oncology | Admitting: Radiation Oncology

## 2019-08-29 ENCOUNTER — Other Ambulatory Visit: Payer: Self-pay

## 2019-08-29 DIAGNOSIS — C7951 Secondary malignant neoplasm of bone: Secondary | ICD-10-CM | POA: Diagnosis not present

## 2019-08-29 NOTE — Progress Notes (Signed)
Tried to reach patient on 08/29/19 via phone for a scheduled counseling intake appointment. There was no answer and counseling intern was unable to leave a voicemail for pt because no voicemail box was available. Counseling intern will follow-up with pt on Monday to attempt to reschedule.   Art Buff [place] Counseling Intern Voicemail:  [phone]

## 2019-09-01 ENCOUNTER — Other Ambulatory Visit: Payer: Self-pay

## 2019-09-01 ENCOUNTER — Ambulatory Visit
Admission: RE | Admit: 2019-09-01 | Discharge: 2019-09-01 | Disposition: A | Discharge status: Home / Self Care | Payer: Commercial Managed Care - PPO | Source: Ambulatory Visit | Attending: Radiation Oncology | Admitting: Radiation Oncology

## 2019-09-01 DIAGNOSIS — C7951 Secondary malignant neoplasm of bone: Secondary | ICD-10-CM | POA: Diagnosis not present

## 2019-09-02 ENCOUNTER — Ambulatory Visit
Admission: RE | Admit: 2019-09-02 | Discharge: 2019-09-02 | Disposition: A | Discharge status: Home / Self Care | Payer: Commercial Managed Care - PPO | Source: Ambulatory Visit | Attending: Radiation Oncology | Admitting: Radiation Oncology

## 2019-09-02 ENCOUNTER — Other Ambulatory Visit: Payer: Self-pay

## 2019-09-02 DIAGNOSIS — C7951 Secondary malignant neoplasm of bone: Secondary | ICD-10-CM | POA: Diagnosis not present

## 2019-09-03 ENCOUNTER — Encounter: Payer: Self-pay | Admitting: Radiation Oncology

## 2019-09-03 ENCOUNTER — Ambulatory Visit
Admission: RE | Admit: 2019-09-03 | Discharge: 2019-09-03 | Disposition: A | Discharge status: Home / Self Care | Payer: Commercial Managed Care - PPO | Source: Ambulatory Visit | Attending: Radiation Oncology | Admitting: Radiation Oncology

## 2019-09-03 ENCOUNTER — Other Ambulatory Visit: Payer: Self-pay

## 2019-09-03 DIAGNOSIS — C7951 Secondary malignant neoplasm of bone: Secondary | ICD-10-CM | POA: Diagnosis not present

## 2019-09-03 NOTE — Progress Notes (Signed)
Patient Care Team: Patient, [name] [name], MD as Consulting Physician (Hematology and Oncology) [name], MD as Consulting Physician (Radiation Oncology) [name], NP as Nurse Practitioner (Hematology and Oncology) Rolm Bookbinder, MD as Consulting Physician (General Surgery)  DIAGNOSIS:    ICD-10-CM   1. Metastatic breast cancer ([place])  C50.919 CBC with Differential ([place])    CMP ([place] only)  2. Malignant neoplasm of central portion of left breast in female, estrogen receptor positive ([place])  C50.112 CBC with Differential ([place])   Z17.0 CMP ([place] only)    SUMMARY OF ONCOLOGIC HISTORY: Oncology History  Cancer of central portion of left female breast ([place])  04/16/2014 Surgery   Bilateral mastectomies: Left breast : Multifocal invasive ductal carcinoma positive for lymphovascular invasion, 1.2 cm and 0.6 cm, grade 3, high-grade DCIS with comedonecrosis, 4 SLN negative T1 C. N0 M0 stage IA: Oncotype DX 13 (ROR 9%)   04/16/2014 Surgery   Left upper back melanoma resected no residual melanoma identified on re-resection margins negative   05/26/2014 - 06/08/2015 Anti-estrogen oral therapy   Tamoxifen 20 mg daily with a plan to switch her to aromatase inhibitors once she gets oophorectomy ( patient did not continue antiestrogen therapy by choice)   08/13/2014 Surgery   oophorectomy with total abdominal hysterectomy   09/08/2015 -  Anti-estrogen oral therapy   Resumed anastrozole after she found recurrence of breast cancer; stopped in January 2018, started letrozole 2.5 mg daily 10/12/2016; switched to Exemestane 25 mg daily on 01/03/17   09/16/2015 Surgery   Skin, left: IDC grade 3; 0.5 cm, margins negative, ER 95%, PR 10%, HER-2 positive ratio 2.08   11/08/2015 - 02/2017 Chemotherapy   Herceptin every 3 weeks plus anastrozole   11/24/2015 - 01/14/2016 Radiation Therapy   Adjuvant  radiation therapy by Dr. Sondra Come   01/2017 -  Anti-estrogen oral therapy   Exemestane daily (stopped others due to side effects), due to muscle aches and pains switch to tamoxifen 5 mg daily starting 12/18/2017   08/07/2019 Relapse/Recurrence   Bone scan: Sclerotic metastatic lesions involving left femoral neck, right ninth rib and entire L5 vertebra; MRI left hip: Diffuse signal abnormality L5 vertebral body with possible extraosseous extension   08/22/2019 - 09/03/2019 Radiation Therapy   Palliative radiation with Dr. Sondra Come to L5 and left proixmal femur.      CHIEF COMPLIANT: Follow-up to discuss treatment plan  INTERVAL HISTORY: Krystal [name] is a 54 y.o. with above-mentioned history of metastatic breast cancer with osseous metastases. She is currently undergoing palliative radiation and is on letrozole. She presents to the clinic today to discuss further treatment. She informed me that her father passed away yesterday from Covid and she is completely grief stricken at this time.  Her pain in the back is doing much better.  She is willing to get started on Ibrance at this time.  ALLERGIES:  is allergic to dilaudid [hydromorphone] and codeine.  MEDICATIONS:  Current Outpatient Medications  Medication Sig Dispense Refill  . ALPRAZolam (XANAX) 0.25 MG tablet Take 1 tablet (0.25 mg total) by mouth 2 (two) times daily as needed for anxiety. 60 tablet 2  . calcium-vitamin D (OSCAL WITH D) 500-200 MG-UNIT tablet Take 1 tablet by mouth 2 (two) times daily. 180 tablet 3  . HYDROcodone-acetaminophen (NORCO) 10-325 MG tablet Take 1 tablet by mouth every 8 (eight) hours as needed. Palliative Care 60 tablet 0  .  letrozole (FEMARA) 2.5 MG tablet Take 1 tablet (2.5 mg total) by mouth daily. 90 tablet 3  . ondansetron (ZOFRAN) 4 MG tablet Take 1 tablet (4 mg total) by mouth every 8 (eight) hours as needed for nausea or vomiting. 20 tablet 1  . palbociclib (IBRANCE) 125 MG capsule Take 1 capsule (125 mg  total) by mouth daily with breakfast. Take whole with food. Take for 21 days on, 7 days off, repeat every 28 days. 21 capsule 1  . traZODone (DESYREL) 50 MG tablet Take 1 tablet (50 mg total) by mouth at bedtime as needed for sleep. 30 tablet 3   No current facility-administered medications for this visit.    PHYSICAL EXAMINATION: ECOG PERFORMANCE STATUS: 1 - Symptomatic but completely ambulatory  Vitals:   09/04/19 0901  BP: 115/76  Pulse: 80  Resp: 17  Temp: 98.2 F (36.8 C)  SpO2: 98%   Filed Weights   09/04/19 0901  Weight: 248 lb 6.4 oz (112.7 kg)    LABORATORY DATA:  I have reviewed the data as listed CMP Latest Ref Rng & Units 08/25/2019 08/12/2019 02/16/2017  Glucose 70 - 99 mg/dL 109(H) 104(H) 190(H)  BUN 6 - 20 mg/dL 9 14 7.9  Creatinine 0.44 - 1.00 mg/dL 0.80 0.82 0.8  Sodium 135 - 145 mmol/L 141 142 142  Potassium 3.5 - 5.1 mmol/L 4.2 4.4 4.3  Chloride 98 - 111 mmol/L 103 103 -  CO2 22 - 32 mmol/L _0 Calcium 8.9 - 10.3 mg/dL 8.7(L) 9.3 9.4  Total Protein 6.5 - 8.1 g/dL 7.1 7.7 7.4  Total Bilirubin 0.3 - 1.2 mg/dL 0.4 0.4 0.41  Alkaline Phos 38 - 126 U/L 64 81 88  AST 15 - 41 U/L 12(L) 18 27  ALT 0 - 44 U/L 21 29 50    Lab Results  Component Value Date   WBC 5.8 08/25/2019   HGB 14.1 08/25/2019   HCT 42.6 08/25/2019   MCV 90.1 08/25/2019   PLT 297 08/25/2019   NEUTROABS 4.2 08/25/2019    ASSESSMENT & PLAN:  Cancer of central portion of left female breast Bone scan 08/07/2019: Increase activity with sclerotic bone metastases involving left femoral neck right ninth rib and entire L5 vertebra  08/22/2019: PET CT scan: Metastatic disease in the chest with multiple lung nodules all subcentimeter size and hypermetabolic lymph nodes (right paratracheal node 7 mm, subcarinal node 1 cm, right hilar node 6 mm), retroperitoneal and upper pelvic common iliac lymph nodes, bone metastases especially L5, L4, T6, right 10th rib, left proximal femur  Bone biopsy  was not felt to be reliable in addition to the effect of radiation on the bone. Palliative radiation completed 09/03/2019 Guardant 360:  Treatment plan: Awaiting guardant 360, currently on letrozole Letrozole toxicities: Tolerating letrozole extremely well without any adverse effects  Since radiation is complete I recommended starting her on Ibrance along with letrozole, Xgeva for bone metastases Ibrance: I discussed the risks and benefits of Ibrance including myelosuppression especially neutropenia and with that risk of infection, there is risk of pulmonary embolism and mild peripheral neuropathy as well. Fatigue, nausea, diarrhea, decreased appetite as well as alopecia and thrombocytopenia are also potential side effects of Ibrance  We will plan to scan her after 3 months of Ibrance and then decide if additional biopsies will need to be obtained.  If there is progression of disease then we will need additional biopsies to make sure that it is HER-2 negative in the ER  positive.  Return to clinic in 2 weeks after starting Ibrance for labs and follow-up    Orders Placed This Encounter  Procedures  . CBC with Differential (Cancer Center Only)    Standing Status:   Future    Standing Expiration Date:   09/03/2020  . CMP ([place] only)    Standing Status:   Future    Standing Expiration Date:   09/03/2020   The patient has a good understanding of the overall plan. she agrees with it. she will call with any problems that may develop before the next visit here.  Total time spent: 30 mins including face to face time and time spent for planning, charting and coordination of care  [name], MD 09/04/2019  I, Cloyde Reams Dorshimer, am acting as scribe for Dr. [name].  I have reviewed the above documentation for accuracy and completeness, and I agree with the above.

## 2019-09-04 ENCOUNTER — Telehealth: Payer: Self-pay

## 2019-09-04 ENCOUNTER — Telehealth: Payer: Self-pay | Admitting: Pharmacist

## 2019-09-04 ENCOUNTER — Inpatient Hospital Stay: Payer: Commercial Managed Care - PPO | Admitting: Hematology and Oncology

## 2019-09-04 ENCOUNTER — Other Ambulatory Visit: Payer: Self-pay

## 2019-09-04 VITALS — BP 115/76 | HR 80 | Temp 98.2°F | Resp 17 | Ht 64.0 in | Wt 248.4 lb

## 2019-09-04 DIAGNOSIS — C50112 Malignant neoplasm of central portion of left female breast: Secondary | ICD-10-CM

## 2019-09-04 DIAGNOSIS — C50919 Malignant neoplasm of unspecified site of unspecified female breast: Secondary | ICD-10-CM

## 2019-09-04 DIAGNOSIS — Z170 Estrogen receptor positive status [ER+]: Secondary | ICD-10-CM | POA: Diagnosis not present

## 2019-09-04 MED ORDER — PALBOCICLIB 125 MG PO CAPS: 125.0000 mg | ORAL_CAPSULE | Freq: Every day | ORAL | 1 refills | Status: DC

## 2019-09-04 NOTE — Telephone Encounter (Signed)
Oral Oncology Patient Advocate Encounter  Prior Authorization for Krystal [name] has been approved.    PA# [id number] Effective dates: 08/05/19 through 09/03/22  Patients co-pay is $40  Oral Oncology Clinic will continue to follow.   [place] Patient [place] Phone [phone] Fax [phone] 09/04/2019 2:15 PM

## 2019-09-04 NOTE — Telephone Encounter (Signed)
Oral Oncology Patient Advocate Encounter  Received notification from [employer] that prior authorization for Krystal [name] is required.  PA submitted on CoverMyMeds Key [id number] Status is pending  Oral Oncology Clinic will continue to follow.  [place] Patient [place] Phone [phone] Fax [phone] 09/04/2019 2:14 PM

## 2019-09-04 NOTE — Assessment & Plan Note (Signed)
Bone scan 08/07/2019: Increase activity with sclerotic bone metastases involving left femoral neck right ninth rib and entire L5 vertebra  08/22/2019: PET CT scan: Metastatic disease in the chest with multiple lung nodules all subcentimeter size and hypermetabolic lymph nodes (right paratracheal node 7 mm, subcarinal node 1 cm, right hilar node 6 mm), retroperitoneal and upper pelvic common iliac lymph nodes, bone metastases especially L5, L4, T6, right 10th rib, left proximal femur  Bone biopsy was not felt to be reliable in addition to the effect of radiation on the bone. Palliative radiation completed 09/03/2019 Guardant 360:  Treatment plan: Awaiting guardant 360, currently on letrozole Letrozole toxicities:  Since radiation is complete I recommended starting her on Ibrance along with letrozole, Xgeva for bone metastases Ibrance: I discussed the risks and benefits of Ibrance including myelosuppression especially neutropenia and with that risk of infection, there is risk of pulmonary embolism and mild peripheral neuropathy as well. Fatigue, nausea, diarrhea, decreased appetite as well as alopecia and thrombocytopenia are also potential side effects of Ibrance  Return to clinic in 2 weeks after starting Ibrance for labs and follow-up

## 2019-09-05 ENCOUNTER — Telehealth: Payer: Self-pay | Admitting: Hematology and Oncology

## 2019-09-05 NOTE — Telephone Encounter (Signed)
I talk with patient regarding schedule  

## 2019-09-09 MED ORDER — PALBOCICLIB 125 MG PO TABS: 125.0000 mg | ORAL_TABLET | Freq: Every day | ORAL | 1 refills | Status: DC

## 2019-09-09 MED FILL — IBRANCE 125 MG TABS: 125 | 28 days supply | Qty: 21 | Fill #0

## 2019-09-09 NOTE — Telephone Encounter (Signed)
Oral Oncology Pharmacist Encounter  Received new prescription for Ibrance (palbociclib) for the treatment of metastatic breast cancer in conjunction with letrozole, planned duration until disease progression or unacceptable drug toxicity.  CMP/CBC from 08/25/19 assessed, no relevant lab abnormalities. Prescription dose and frequency assessed.   Current medication list in Epic reviewed, several DDIs with palbociclib identified: - Palbociclib may increase the serum concentration of alprazolam, hydrocodone, and trazodone. Monitor patient for increased adverse events related to the listed medication (ie. increased sedation)  Prescription has been e-scribed to the [facility] for benefits analysis and approval.  Oral Oncology Clinic will continue to follow for insurance authorization, copayment issues, initial counseling and start date.  Darl Pikes, PharmD, BCPS, [facility] Hematology/Oncology Clinical Pharmacist [facility] [place] [phone]  09/09/2019 11:36 AM

## 2019-09-09 NOTE — Telephone Encounter (Signed)
Oral Chemotherapy Pharmacist Encounter  [facility] will deliver her medication on 09/10/19. She plans on getting started on Thursday 09/11/19, will will work well with her follow-up appt on 09/24/19.  Patient Education I spoke with patient for overview of new oral chemotherapy medication: Ibrance (palbociclib) for the treatment of metastatic breast cancer in conjunction with letrozole, planned duration until disease progression or unacceptable drug toxicity.   Counseled patient on administration, dosing, side effects, monitoring, drug-food interactions, safe handling, storage, and disposal. Patient will take 1 tablet (125 mg total) by mouth daily. Take for 21 days on, 7 days off, repeat every 28 days.  Side effects include but not limited to: decreased wbc, fatigue, N/V, hair loss.    Reviewed with patient importance of keeping a medication schedule and plan for any missed doses.  Ms. Ellyson voiced understanding and appreciation. All questions answered. Medication handout  And calendar placed in the mail.  Provided patient with Oral [facility] phone number. Patient knows to call the office with questions or concerns. Oral Chemotherapy Navigation Clinic will continue to follow.  Darl Pikes, PharmD, BCPS, [facility] Hematology/Oncology Clinical Pharmacist [facility] Oral [place] [place] [facility] [phone]  09/09/2019 2:53 PM

## 2019-09-10 LAB — GUARDANT 360

## 2019-09-10 NOTE — Progress Notes (Incomplete)
  Patient Name: Krystal [name] MRN: [id number] DOB: [birth year] Referring Physician: [name] (Profile Not Attached) Date of Service: 09/03/2019 [facility], [place]                                                        End Of Treatment Note  Diagnoses: C50.112-Malignant neoplasm of central portion of left female breast C79.51-Secondary malignant neoplasm of bone  Cancer Staging: Metastatic breast cancer with osseous metastases  Intent: Palliative  Radiation Treatment Dates: 08/21/2019 through 09/03/2019 Site Technique Total Dose (Gy) Dose per Fx (Gy) Completed Fx Beam Energies  Femur Left: Ext_Lt 3D 30/30 3 10/10 10X, 15X  Lumbar Spine: Spine 3D 30/30 3 10/10 10X, 15X   Narrative: The patient tolerated radiation therapy relatively well. However, she did not notice any improvement in pain up until the end of treatment. She also reported some mild fatigue and nausea. She denied diarrhea.  Of note, the patient had a PET scan done on 08/22/2019. The results showed metastatic disease in the chest with multiple pulmonary nodules and hypermetabolic lymph nodes. There were also signs of osseous metastatic disease and osseous involvement in the abdomen and upper pelvis.  Plan: The patient will follow-up with radiation oncology in one month.  ________________________________________________   Blair Promise, PhD, MD  This document serves as a record of services personally performed by [name], MD. It was created on his behalf by Clerance Lav, a trained medical scribe. The creation of this record is based on the scribe's personal observations and the provider's statements to them. This document has been checked and approved by the attending provider.

## 2019-09-11 NOTE — Progress Notes (Signed)
Counseling intern called patient on 09/11/19 for a scheduled counseling intake session via phone, but pt did not answer. Left patient voicemail encouraging her to return my call and give me dates and times that work for her to reschedule. CI will follow-up with pt on Monday if no return message is received.  Verlan Friends [place] Counseling Intern Voicemail: [phone]

## 2019-09-15 ENCOUNTER — Telehealth: Payer: Self-pay | Admitting: *Deleted

## 2019-09-15 NOTE — Telephone Encounter (Signed)
Received after hours message from pt stating she was exposed to her daughter who has tested positive for Covid 19.  Pt states she went to [facility] on 09/13/19 to be tested and is waiting for the results.  MD advised pt to hold Ibrance until results are back.  Pt verbalized understanding and will update the office on her results.

## 2019-09-17 NOTE — Progress Notes (Signed)
Counseling intern spoke to patient on 09/17/19 for a follow-up call. CI was not able to reach pt for most recent scheduled counseling session, but asked pt if we can schedule for a time next week and pt agreed. Pt stated she is doing "a little better" this week and stated she has more energy, is able to do tasks, and does not feel constantly drained. Pt stated she going off her medication tomorrow and is nervous about that. Additionally, pt is worrying about her15-yr-old daughter because she is currently diagnosed with Covid-19 and feels tired. This is particularly upsetting because the pt recently lost her dad to Covid.   Next Counseling Session: Monday, September 22, 2019 at 12:00pm via phone  Art Buff [facility] Counseling Intern Voicemail:  [phone]

## 2019-09-22 NOTE — Progress Notes (Signed)
Spoke to patient on 09/22/19 for a counseling session via phone. Counseling intern provided therapeutic listening, empathy, and normalization of feelings. Pt reported having a "good" weekend and explaining she was able to go out to dinner and go shopping. Pt stated she wants to get the thoughts about her cancer "out of her head" and that she uses distraction and positive self-talk as coping strategies with some success. Pt recognizes she is doing what she can to treat her cancer, but finds she still worries. Additionally, pt reported experiencing daily pain and fatigue which make coping more difficult. Pt and CI discussed her experience of feeling alone in her diagnosis, especially because she doesn't look sick. Pt stated it is helpful to have a space to talk about her experience and hopes to learn new coping strategies. CI suggested the pt work on using mindfulness to recognize when her negative thoughts are first popping into her head and trying her thought replacement and positive self-talk.   Next Counseling Appt: Monday, October 06, 2019 at 12:00pm via phone  Art Buff [facility] Counseling Intern Voicemail:  [phone]

## 2019-09-24 ENCOUNTER — Ambulatory Visit: Payer: Commercial Managed Care - PPO | Admitting: Hematology and Oncology

## 2019-09-24 ENCOUNTER — Other Ambulatory Visit: Payer: Commercial Managed Care - PPO

## 2019-10-02 ENCOUNTER — Inpatient Hospital Stay: Payer: Commercial Managed Care - PPO

## 2019-10-02 ENCOUNTER — Inpatient Hospital Stay: Payer: Commercial Managed Care - PPO | Admitting: Hematology and Oncology

## 2019-10-06 NOTE — Progress Notes (Signed)
Counseling intern called patient on 10/06/19 for a scheduled telehealth counseling session via phone. Pt did not answer and CI left voicemail. CI will follow-up with pt later this week.   Art Buff [facility] Counseling Intern Voicemail:  [phone]

## 2019-10-08 NOTE — Progress Notes (Signed)
Patient Care Team: Patient, [name] [name], MD as Consulting Physician (Hematology and Oncology) [name], MD as Consulting Physician (Radiation Oncology) Gardenia Phlegm, NP as Nurse Practitioner (Hematology and Oncology) Rolm Bookbinder, MD as Consulting Physician (General Surgery)  DIAGNOSIS:    ICD-10-CM   1. Malignant neoplasm of central portion of left breast in female, estrogen receptor positive ([place])  C50.112    Z17.0     SUMMARY OF ONCOLOGIC HISTORY: Oncology History  Cancer of central portion of left female breast ([place])  04/16/2014 Surgery   Bilateral mastectomies: Left breast : Multifocal invasive ductal carcinoma positive for lymphovascular invasion, 1.2 cm and 0.6 cm, grade 3, high-grade DCIS with comedonecrosis, 4 SLN negative T1 C. N0 M0 stage IA: Oncotype DX 13 (ROR 9%)   04/16/2014 Surgery   Left upper back melanoma resected no residual melanoma identified on re-resection margins negative   05/26/2014 - 06/08/2015 Anti-estrogen oral therapy   Tamoxifen 20 mg daily with a plan to switch her to aromatase inhibitors once she gets oophorectomy ( patient did not continue antiestrogen therapy by choice)   08/13/2014 Surgery   oophorectomy with total abdominal hysterectomy   09/08/2015 -  Anti-estrogen oral therapy   Resumed anastrozole after she found recurrence of breast cancer; stopped in January 2018, started letrozole 2.5 mg daily 10/12/2016; switched to Exemestane 25 mg daily on 01/03/17   09/16/2015 Surgery   Skin, left: IDC grade 3; 0.5 cm, margins negative, ER 95%, PR 10%, HER-2 positive ratio 2.08   11/08/2015 - 02/2017 Chemotherapy   Herceptin every 3 weeks plus anastrozole   11/24/2015 - 01/14/2016 Radiation Therapy   Adjuvant radiation therapy by Dr. Sondra Come   01/2017 -  Anti-estrogen oral therapy   Exemestane daily (stopped others due to side effects), due to muscle aches and pains switch to tamoxifen 5  mg daily starting 12/18/2017   08/07/2019 Relapse/Recurrence   Bone scan: Sclerotic metastatic lesions involving left femoral neck, right ninth rib and entire L5 vertebra; MRI left hip: Diffuse signal abnormality L5 vertebral body with possible extraosseous extension   08/22/2019 - 09/03/2019 Radiation Therapy   Palliative radiation with Dr. Sondra Come to L5 and left proixmal femur.      CHIEF COMPLIANT: Follow-up of metastatic breast cancer on Ibrance and letrozole  INTERVAL HISTORY: Krystal [name] is a 54 y.o. with above-mentioned history of metastatic breast cancer with osseous metastases. She is currently on treatment with Ibrance and letrozole. She presents to the clinic todayfor a toxicity check.  She complains of fatigue which appears to be bothering her but she is able to manage.  She also has difficulty with sleeping but decided not to take any sedative medicines because of potential interactions with Ibrance.  She has intermittent nausea for which she takes Zofran.  ALLERGIES:  is allergic to dilaudid [hydromorphone] and codeine.  MEDICATIONS:  Current Outpatient Medications  Medication Sig Dispense Refill  . calcium-vitamin D (OSCAL WITH D) 500-200 MG-UNIT tablet Take 1 tablet by mouth 2 (two) times daily. 180 tablet 3  . letrozole (FEMARA) 2.5 MG tablet Take 1 tablet (2.5 mg total) by mouth daily. 90 tablet 3  . ondansetron (ZOFRAN) 4 MG tablet Take 1 tablet (4 mg total) by mouth every 8 (eight) hours as needed for nausea or vomiting. 20 tablet 3  . palbociclib (IBRANCE) 125 MG tablet Take 1 tablet (125 mg total) by mouth daily. Take for 21 days on, 7 days off, repeat  every 28 days. 21 tablet 1   No current facility-administered medications for this visit.    PHYSICAL EXAMINATION: ECOG PERFORMANCE STATUS: 1 - Symptomatic but completely ambulatory  Vitals:   10/09/19 0930  BP: 109/70  Pulse: 76  Resp: 18  Temp: 98.7 F (37.1 C)  SpO2: 99%   Filed Weights   10/09/19 0930    Weight: 246 lb 4.8 oz (111.7 kg)    LABORATORY DATA:  I have reviewed the data as listed CMP Latest Ref Rng & Units 10/09/2019 08/25/2019 08/12/2019  Glucose 70 - 99 mg/dL 114(H) 109(H) 104(H)  BUN 6 - 20 mg/dL 13 9 14   Creatinine 0.44 - 1.00 mg/dL 0.83 0.80 0.82  Sodium 135 - 145 mmol/L 144 141 142  Potassium 3.5 - 5.1 mmol/L 4.5 4.2 4.4  Chloride 98 - 111 mmol/L 105 103 103  CO2 22 - 32 mmol/L 30 27 27   Calcium 8.9 - 10.3 mg/dL 9.2 8.7(L) 9.3  Total Protein 6.5 - 8.1 g/dL 7.0 7.1 7.7  Total Bilirubin 0.3 - 1.2 mg/dL 0.4 0.4 0.4  Alkaline Phos 38 - 126 U/L 59 64 81  AST 15 - 41 U/L 18 12(L) 18  ALT 0 - 44 U/L 25 21 29     Lab Results  Component Value Date   WBC 1.8 (L) 10/09/2019   HGB 12.8 10/09/2019   HCT 37.4 10/09/2019   MCV 92.1 10/09/2019   PLT 190 10/09/2019   NEUTROABS 0.9 (L) 10/09/2019    ASSESSMENT & PLAN:  Cancer of central portion of left female breast Bone scan 08/07/2019: Increase activity with sclerotic bone metastases involving left femoral neck right ninth rib and entire L5 vertebra  08/22/2019: PET CT scan: Metastatic disease in the chest with multiple lung nodules all subcentimeter size and hypermetabolic lymph nodes (right paratracheal node 7 mm, subcarinal node 1 cm, right hilar node 6 mm), retroperitoneal and upper pelvic common iliac lymph nodes, bone metastases especially L5, L4, T6, right 10th rib, left proximal femur  Bone biopsy was not felt to be reliable in addition to the effect of radiation on the bone. Palliative radiation completed 09/03/2019 [facility]: BRCA1 mutation, TMB 5.36, MSI: Normal  Treatment plan: Ibrance with letrozole and Xgeva for bone metastases Letrozole toxicities: Tolerating letrozole extremely well without any adverse effects  Ibrance toxicities: 1.  Fatigue 2.  Low white blood cell count  Lab review: WBC 1.8, hemoglobin 12.8, platelets 190  Return to clinic in 4 weeks for labs and follow-up. Scans will be done in  3 months.    No orders of the defined types were placed in this encounter.  The patient has a good understanding of the overall plan. she agrees with it. she will call with any problems that may develop before the next visit here.  Total time spent: 30 mins including face to face time and time spent for planning, charting and coordination of care  [name], MD 10/09/2019  I, Cloyde Reams Dorshimer, am acting as scribe for Dr. [name].  I have reviewed the above documentation for accuracy and completeness, and I agree with the above.

## 2019-10-09 ENCOUNTER — Other Ambulatory Visit: Payer: Self-pay

## 2019-10-09 ENCOUNTER — Encounter: Payer: Self-pay | Admitting: Radiation Oncology

## 2019-10-09 ENCOUNTER — Ambulatory Visit
Admission: RE | Admit: 2019-10-09 | Discharge: 2019-10-09 | Disposition: A | Discharge status: Home / Self Care | Payer: Commercial Managed Care - PPO | Source: Ambulatory Visit | Attending: Radiation Oncology | Admitting: Radiation Oncology

## 2019-10-09 ENCOUNTER — Inpatient Hospital Stay: Payer: Commercial Managed Care - PPO | Attending: Hematology and Oncology

## 2019-10-09 ENCOUNTER — Inpatient Hospital Stay (HOSPITAL_BASED_OUTPATIENT_CLINIC_OR_DEPARTMENT_OTHER): Payer: Commercial Managed Care - PPO | Admitting: Hematology and Oncology

## 2019-10-09 VITALS — BP 112/77 | HR 70 | Temp 98.7°F | Resp 20 | Wt 246.2 lb

## 2019-10-09 DIAGNOSIS — Z9221 Personal history of antineoplastic chemotherapy: Secondary | ICD-10-CM | POA: Diagnosis not present

## 2019-10-09 DIAGNOSIS — R5383 Other fatigue: Secondary | ICD-10-CM | POA: Insufficient documentation

## 2019-10-09 DIAGNOSIS — Z85820 Personal history of malignant melanoma of skin: Secondary | ICD-10-CM | POA: Diagnosis not present

## 2019-10-09 DIAGNOSIS — C50112 Malignant neoplasm of central portion of left female breast: Secondary | ICD-10-CM

## 2019-10-09 DIAGNOSIS — Z923 Personal history of irradiation: Secondary | ICD-10-CM | POA: Insufficient documentation

## 2019-10-09 DIAGNOSIS — Z79899 Other long term (current) drug therapy: Secondary | ICD-10-CM | POA: Diagnosis not present

## 2019-10-09 DIAGNOSIS — C7951 Secondary malignant neoplasm of bone: Secondary | ICD-10-CM | POA: Insufficient documentation

## 2019-10-09 DIAGNOSIS — Z170 Estrogen receptor positive status [ER+]: Secondary | ICD-10-CM

## 2019-10-09 DIAGNOSIS — Z79811 Long term (current) use of aromatase inhibitors: Secondary | ICD-10-CM | POA: Diagnosis not present

## 2019-10-09 DIAGNOSIS — C50919 Malignant neoplasm of unspecified site of unspecified female breast: Secondary | ICD-10-CM

## 2019-10-09 DIAGNOSIS — Z9013 Acquired absence of bilateral breasts and nipples: Secondary | ICD-10-CM | POA: Insufficient documentation

## 2019-10-09 DIAGNOSIS — R110 Nausea: Secondary | ICD-10-CM | POA: Insufficient documentation

## 2019-10-09 LAB — CMP (CANCER CENTER ONLY)
ALT: 25 U/L (ref 0–44)
AST: 18 U/L (ref 15–41)
Albumin: 4 g/dL (ref 3.5–5.0)
Alkaline Phosphatase: 59 U/L (ref 38–126)
Anion gap: 9 (ref 5–15)
BUN: 13 mg/dL (ref 6–20)
CO2: 30 mmol/L (ref 22–32)
Calcium: 9.2 mg/dL (ref 8.9–10.3)
Chloride: 105 mmol/L (ref 98–111)
Creatinine: 0.83 mg/dL (ref 0.44–1.00)
GFR, Est AFR Am: >60 mL/min (ref >60)
GFR, Estimated: >60 mL/min (ref >60)
Glucose, Bld: 114 mg/dL — ABNORMAL HIGH (ref 70–99)
Potassium: 4.5 mmol/L (ref 3.5–5.1)
Sodium: 144 mmol/L (ref 135–145)
Total Bilirubin: 0.4 mg/dL (ref 0.3–1.2)
Total Protein: 7 g/dL (ref 6.5–8.1)

## 2019-10-09 LAB — CBC WITH DIFFERENTIAL (CANCER CENTER ONLY)
Abs Immature Granulocytes: 0 10*3/uL (ref 0.00–0.07)
Basophils Absolute: 0 10*3/uL (ref 0.0–0.1)
Basophils Relative: 2 %
Eosinophils Absolute: 0.1 10*3/uL (ref 0.0–0.5)
Eosinophils Relative: 6 %
HCT: 37.4 % (ref 36.0–46.0)
Hemoglobin: 12.8 g/dL (ref 12.0–15.0)
Immature Granulocytes: 0 %
Lymphocytes Relative: 32 %
Lymphs Abs: 0.6 10*3/uL — ABNORMAL LOW (ref 0.7–4.0)
MCH: 31.5 pg (ref 26.0–34.0)
MCHC: 34.2 g/dL (ref 30.0–36.0)
MCV: 92.1 fL (ref 80.0–100.0)
Monocytes Absolute: 0.1 10*3/uL (ref 0.1–1.0)
Monocytes Relative: 7 %
Neutro Abs: 0.9 10*3/uL — ABNORMAL LOW (ref 1.7–7.7)
Neutrophils Relative %: 53 %
Platelet Count: 190 10*3/uL (ref 150–400)
RBC: 4.06 MIL/uL (ref 3.87–5.11)
RDW: 14.6 % (ref 11.5–15.5)
WBC Count: 1.8 10*3/uL — ABNORMAL LOW (ref 4.0–10.5)
nRBC: 0 % (ref 0.0–0.2)

## 2019-10-09 MED ORDER — ONDANSETRON HCL 4 MG PO TABS: 4.0000 mg | ORAL_TABLET | Freq: Three times a day (TID) | ORAL | 3 refills | Status: DC | PRN

## 2019-10-09 NOTE — Progress Notes (Signed)
Radiation Oncology         [phone]) [phone] ________________________________  Name: Krystal [name] MRN: [id number]  Date: 10/09/2019  DOB: [birth year]  Follow-Up Visit Note  CC: Patient, [name]  [name], MD    ICD-10-CM   1. Osseous metastasis ([facility])  C79.51     Diagnosis:   Metastatic breast cancer with osseous metastases  Interval Since Last Radiation:  5 weeks  08/21/2019 through 09/03/2019 Site Technique Total Dose (Gy) Dose per Fx (Gy) Completed Fx Beam Energies  Femur Left: Ext_Lt 3D 30/30 3 10/10 10X, 15X  Lumbar Spine: L5 3D 30/30 3 10/10 10X, 15X   11/25/15 - 01/14/16: 1) Left chest wall treated to 45 Gy in 25 fractions    2) Scar boost treated to 18 Gy in 9 fractions   Narrative:  The patient returns today for routine follow-up. She last saw Dr. Lindi Adie for follow up earlier today. She continues on Ibrance and letrozole.   On review of systems, she reports slight improvement in her gait but that she is still unable to sleep in certain positions. She also reports discomfort when resting too long that is relieved with mobility. She states that the skin in the radiation field became tender and was relieved with moisturizer. Finally, she reports that her radiation-induced fatigue has not yet fully resolved.  ALLERGIES:  is allergic to dilaudid [hydromorphone] and codeine.  Meds: Current Outpatient Medications  Medication Sig Dispense Refill  . calcium-vitamin D (OSCAL WITH D) 500-200 MG-UNIT tablet Take 1 tablet by mouth 2 (two) times daily. 180 tablet 3  . letrozole (FEMARA) 2.5 MG tablet Take 1 tablet (2.5 mg total) by mouth daily. 90 tablet 3  . ondansetron (ZOFRAN) 4 MG tablet Take 1 tablet (4 mg total) by mouth every 8 (eight) hours as needed for nausea or vomiting. 20 tablet 3  . palbociclib (IBRANCE) 125 MG tablet Take 1 tablet (125 mg total) by mouth daily. Take for 21 days on, 7 days off, repeat every 28 days. 21 tablet 1   No current facility-administered  medications for this encounter.    Physical Findings: The patient is in no acute distress. Patient is alert and oriented.  weight is 246 lb 3.2 oz (111.7 kg). Her temperature is 98.7 F (37.1 C). Her blood pressure is 112/77 and her pulse is 70. Her respiration is 20 and oxygen saturation is 98%. .  No significant changes. Lungs are clear to auscultation bilaterally. Heart has regular rate and rhythm. No palpable cervical, supraclavicular, or axillary adenopathy. Abdomen soft, non-tender, normal bowel sounds.  Neurological exam nonfocal in the lower extremities   Lab Findings: Lab Results  Component Value Date   WBC 1.8 (L) 10/09/2019   HGB 12.8 10/09/2019   HCT 37.4 10/09/2019   MCV 92.1 10/09/2019   PLT 190 10/09/2019    Radiographic Findings: No results found.  Impression:  Metastatic Breast Cancer  The patient is recovering from the effects of radiation.  Overall patient had improvement in her pain in the lower back and left pelvis with her palliative radiation therapy  Plan: As needed follow-up in radiation oncology.  Patient will continue on therapy as above with medical oncology.  ____________________________________ -----------------------------------  Blair Promise, PhD, MD   This document serves as a record of services personally performed by [name], MD. It was created on his behalf by Wilburn Mylar, a trained medical scribe. The creation of this record is based on the scribe's personal observations and  the provider's statements to them. This document has been checked and approved by the attending provider.

## 2019-10-09 NOTE — Assessment & Plan Note (Signed)
Bone scan 08/07/2019: Increase activity with sclerotic bone metastases involving left femoral neck right ninth rib and entire L5 vertebra  08/22/2019: PET CT scan: Metastatic disease in the chest with multiple lung nodules all subcentimeter size and hypermetabolic lymph nodes (right paratracheal node 7 mm, subcarinal node 1 cm, right hilar node 6 mm), retroperitoneal and upper pelvic common iliac lymph nodes, bone metastases especially L5, L4, T6, right 10th rib, left proximal femur  Bone biopsy was not felt to be reliable in addition to the effect of radiation on the bone. Palliative radiation completed 09/03/2019 [facility]: BRCA1 mutation, TMB 5.36, MSI: Normal  Treatment plan: Ibrance with letrozole and Xgeva for bone metastases Letrozole toxicities: Tolerating letrozole extremely well without any adverse effects Ibrance toxicities:  Lab review:  Return to clinic in 4 weeks for labs and follow-up. Scans will be done in 3 months.

## 2019-10-09 NOTE — Patient Instructions (Signed)
Coronavirus (COVID-19) Are you at risk?  Are you at risk for the Coronavirus (COVID-19)?  To be considered HIGH RISK for Coronavirus (COVID-19), you have to meet the following criteria:  . Traveled to [place], [place], [place], [place] or [place]; or in the [place] to [place], [place], [place], or [place]; and have fever, cough, and shortness of breath within the last 2 weeks of travel OR . Been in close contact with a person diagnosed with COVID-19 within the last 2 weeks and have fever, cough, and shortness of breath . IF YOU DO NOT MEET THESE CRITERIA, YOU ARE CONSIDERED LOW RISK FOR COVID-19.  What to do if you are HIGH RISK for COVID-19?  . If you are having a medical emergency, call 911. . Seek medical care right away. Before you go to a doctor's office, urgent care or emergency department, call ahead and tell them about your recent travel, contact with someone diagnosed with COVID-19, and your symptoms. You should receive instructions from your physician's office regarding next steps of care.  . When you arrive at healthcare provider, tell the healthcare staff immediately you have returned from visiting [place], [place], [place], [place] or [place]; or traveled in the [place] to [place], [place], [place], or [place]; in the last two weeks or you have been in close contact with a person diagnosed with COVID-19 in the last 2 weeks.   . Tell the health care staff about your symptoms: fever, cough and shortness of breath. . After you have been seen by a medical provider, you will be either: o Tested for (COVID-19) and discharged home on quarantine except to seek medical care if symptoms worsen, and asked to  - Stay home and avoid contact with others until you get your results (4-5 days)  - Avoid travel on public transportation if possible (such as bus, train, or airplane) or o Sent to the Emergency Department by EMS for evaluation, COVID-19 testing, and possible  admission depending on your condition and test results.  What to do if you are LOW RISK for COVID-19?  Reduce your risk of any infection by using the same precautions used for avoiding the common cold or flu:  . Wash your hands often with soap and warm water for at least 20 seconds.  If soap and water are not readily available, use an alcohol-based hand sanitizer with at least 60% alcohol.  . If coughing or sneezing, cover your mouth and nose by coughing or sneezing into the elbow areas of your shirt or coat, into a tissue or into your sleeve (not your hands). . Avoid shaking hands with others and consider head nods or verbal greetings only. . Avoid touching your eyes, nose, or mouth with unwashed hands.  . Avoid close contact with people who are sick. . Avoid places or events with large numbers of people in one location, like concerts or sporting events. . Carefully consider travel plans you have or are making. . If you are planning any travel outside or inside the [place], visit the CDC's Travelers' Health webpage for the latest health notices. . If you have some symptoms but not all symptoms, continue to monitor at home and seek medical attention if your symptoms worsen. . If you are having a medical emergency, call 911.   ADDITIONAL HEALTHCARE OPTIONS FOR PATIENTS  [facility] / e-Visit: [email or web address]         [facility]: [phone]  [facility]: [phone]                   [facility]: [phone]   

## 2019-10-09 NOTE — Progress Notes (Signed)
Ms. Frierson presents today for f/u with Dr. Sondra Come. Pt asked if she was having any pain today, pt replied "not really". Pt reports skin in XRT field did become tender and OTC moisturizer relieved. Pt had left femur and lumbar spine treated. Pt reports gait has slightly improved but is still unable to sleep in certain positions. Pt states she has discomfort when she "sits too long" upon rising but discomfort relieves itself with mobility. Pt reports that fatigue associated with XRT has not resolved fully.  BP 112/77 (BP Location: Right Arm, Patient Position: Sitting, Cuff Size: Large)   Pulse 70   Temp 98.7 F (37.1 C)   Resp 20   Wt 246 lb 3.2 oz (111.7 kg)   LMP 07/06/2014   SpO2 98%   BMI 42.26 kg/m   Wt Readings from Last 3 Encounters:  10/09/19 246 lb 3.2 oz (111.7 kg)  10/09/19 246 lb 4.8 oz (111.7 kg)  09/04/19 248 lb 6.4 oz (112.7 kg)   Loma Sousa, RN BSN

## 2019-10-10 ENCOUNTER — Telehealth: Payer: Self-pay | Admitting: Hematology and Oncology

## 2019-10-10 MED FILL — IBRANCE 125 MG TABS: 125 | 28 days supply | Qty: 21 | Fill #1

## 2019-10-10 NOTE — Telephone Encounter (Signed)
I talk with patient regarding schedule  

## 2019-10-27 NOTE — Progress Notes (Signed)
Counseling intern called patient to check-in. Pt stated she is no longer interested in counseling. CI expressed understanding and encouraged the pt to call the [facility] Patient and Family Support Department if ever needing additional support.   Art Buff [facility] Counseling Intern Voicemail:  [phone]

## 2019-11-05 ENCOUNTER — Other Ambulatory Visit: Payer: Self-pay | Admitting: Hematology and Oncology

## 2019-11-05 DIAGNOSIS — C50112 Malignant neoplasm of central portion of left female breast: Secondary | ICD-10-CM

## 2019-11-05 DIAGNOSIS — Z170 Estrogen receptor positive status [ER+]: Secondary | ICD-10-CM

## 2019-11-09 NOTE — Progress Notes (Signed)
Patient Care Team: Patient, [name] [name], MD as Consulting Physician (Hematology and Oncology) [name], MD as Consulting Physician (Radiation Oncology) [name], NP as Nurse Practitioner (Hematology and Oncology) Rolm Bookbinder, MD as Consulting Physician (General Surgery)  DIAGNOSIS:    ICD-10-CM   1. Metastatic breast cancer ([place])  C50.919   2. Malignant neoplasm of central portion of left breast in female, estrogen receptor positive ([place])  C50.112    Z17.0     SUMMARY OF ONCOLOGIC HISTORY: Oncology History  Cancer of central portion of left female breast ([place])  04/16/2014 Surgery   Bilateral mastectomies: Left breast : Multifocal invasive ductal carcinoma positive for lymphovascular invasion, 1.2 cm and 0.6 cm, grade 3, high-grade DCIS with comedonecrosis, 4 SLN negative T1 C. N0 M0 stage IA: Oncotype DX 13 (ROR 9%)   04/16/2014 Surgery   Left upper back melanoma resected no residual melanoma identified on re-resection margins negative   05/26/2014 - 06/08/2015 Anti-estrogen oral therapy   Tamoxifen 20 mg daily with a plan to switch her to aromatase inhibitors once she gets oophorectomy ( patient did not continue antiestrogen therapy by choice)   08/13/2014 Surgery   oophorectomy with total abdominal hysterectomy   09/08/2015 -  Anti-estrogen oral therapy   Resumed anastrozole after she found recurrence of breast cancer; stopped in January 2018, started letrozole 2.5 mg daily 10/12/2016; switched to Exemestane 25 mg daily on 01/03/17   09/16/2015 Surgery   Skin, left: IDC grade 3; 0.5 cm, margins negative, ER 95%, PR 10%, HER-2 positive ratio 2.08   11/08/2015 - 02/2017 Chemotherapy   Herceptin every 3 weeks plus anastrozole   11/24/2015 - 01/14/2016 Radiation Therapy   Adjuvant radiation therapy by Dr. Sondra Come   01/2017 -  Anti-estrogen oral therapy   Exemestane daily (stopped others due to side effects), due to  muscle aches and pains switch to tamoxifen 5 mg daily starting 12/18/2017   08/07/2019 Relapse/Recurrence   Bone scan: Sclerotic metastatic lesions involving left femoral neck, right ninth rib and entire L5 vertebra; MRI left hip: Diffuse signal abnormality L5 vertebral body with possible extraosseous extension   08/22/2019 - 09/03/2019 Radiation Therapy   Palliative radiation with Dr. Sondra Come to L5 and left proixmal femur.      CHIEF COMPLIANT: Follow-up of metastatic breast cancer on Ibrance and letrozole  INTERVAL HISTORY: Krystal [name] is a 54 y.o. with above-mentioned history of metastatic breast cancer with osseous metastases currently on treatment with Ibrance and letrozole.She presents to the clinic todayfor a toxicity check.   ALLERGIES:  is allergic to dilaudid [hydromorphone] and codeine.  MEDICATIONS:  Current Outpatient Medications  Medication Sig Dispense Refill  . calcium-vitamin D (OSCAL WITH D) 500-200 MG-UNIT tablet Take 1 tablet by mouth 2 (two) times daily. 180 tablet 3  . IBRANCE 125 MG tablet TAKE 1 TABLET (125 MG TOTAL) BY MOUTH DAILY. TAKE FOR 21 DAYS ON, 7 DAYS OFF, REPEAT EVERY 28 DAYS. 21 tablet 1  . letrozole (FEMARA) 2.5 MG tablet Take 1 tablet (2.5 mg total) by mouth daily. 90 tablet 3  . ondansetron (ZOFRAN) 4 MG tablet Take 1 tablet (4 mg total) by mouth every 8 (eight) hours as needed for nausea or vomiting. 20 tablet 3   No current facility-administered medications for this visit.    PHYSICAL EXAMINATION: ECOG PERFORMANCE STATUS: 1 - Symptomatic but completely ambulatory  Vitals:   11/10/19 0853  BP: 112/79  Pulse: 80  Resp: 18  Temp: 98.2 F (36.8 C)  SpO2: 100%   Filed Weights   11/10/19 0853  Weight: 246 lb 12.8 oz (111.9 kg)    LABORATORY DATA:  I have reviewed the data as listed CMP Latest Ref Rng & Units 10/09/2019 08/25/2019 08/12/2019  Glucose 70 - 99 mg/dL 114(H) 109(H) 104(H)  BUN 6 - 20 mg/dL 13 9 14   Creatinine 0.44 - 1.00 mg/dL  0.83 0.80 0.82  Sodium 135 - 145 mmol/L 144 141 142  Potassium 3.5 - 5.1 mmol/L 4.5 4.2 4.4  Chloride 98 - 111 mmol/L 105 103 103  CO2 22 - 32 mmol/L 30 27 27   Calcium 8.9 - 10.3 mg/dL 9.2 8.7(L) 9.3  Total Protein 6.5 - 8.1 g/dL 7.0 7.1 7.7  Total Bilirubin 0.3 - 1.2 mg/dL 0.4 0.4 0.4  Alkaline Phos 38 - 126 U/L 59 64 81  AST 15 - 41 U/L 18 12(L) 18  ALT 0 - 44 U/L 25 21 29     Lab Results  Component Value Date   WBC 2.0 (L) 11/10/2019   HGB 12.6 11/10/2019   HCT 37.3 11/10/2019   MCV 98.2 11/10/2019   PLT 185 11/10/2019   NEUTROABS 1.0 (L) 11/10/2019    ASSESSMENT & PLAN:  Cancer of central portion of left female breast Bone scan 08/07/2019: Increase activity with sclerotic bone metastases involving left femoral neck right ninth rib and entire L5 vertebra  08/22/2019: PET CT scan: Metastatic disease in the chest with multiple lung nodules all subcentimeter size and hypermetabolic lymph nodes (right paratracheal node 7 mm, subcarinal node 1 cm, right hilar node 6 mm), retroperitoneal and upper pelvic common iliac lymph nodes, bone metastases especially L5, L4, T6, right 10th rib, left proximal femur  Bone biopsy was not felt to be reliable in addition to the effect of radiation on the bone. Palliative radiation completed 09/03/2019 [facility] 360: BRCA1 mutation, TMB 5.36, MSI: Normal  Treatment plan: Ibrance with letrozole and Xgeva for bone metastases Letrozole toxicities:Tolerating letrozole extremely well without any adverse effects  Ibrance toxicities: 1.  Fatigue: She is trying to manage her life in spite of the fatigue. 2.  Low white blood cell count: ANC 1 we will continue on the same dosage. 3.  Hair thinning: We talked about some remedies for the hair.  Lab review:  [place] 1 Patient is planning to go to [name] in [place]. I recommended that she get Covid vaccine.  She is not interested at this time.  Return to clinic in 1 month with labs and follow-up after CT  chest abdomen pelvis.   No orders of the defined types were placed in this encounter.  The patient has a good understanding of the overall plan. she agrees with it. she will call with any problems that may develop before the next visit here.  Total time spent: 30 mins including face to face time and time spent for planning, charting and coordination of care  [name], MD 11/10/2019  I, Cloyde Reams Dorshimer, am acting as scribe for Dr. [name].  I have reviewed the above documentation for accuracy and completeness, and I agree with the above.

## 2019-11-10 ENCOUNTER — Inpatient Hospital Stay: Payer: Commercial Managed Care - PPO | Attending: Hematology and Oncology | Admitting: Hematology and Oncology

## 2019-11-10 ENCOUNTER — Inpatient Hospital Stay: Payer: Commercial Managed Care - PPO

## 2019-11-10 ENCOUNTER — Other Ambulatory Visit: Payer: Self-pay

## 2019-11-10 VITALS — BP 112/79 | HR 80 | Temp 98.2°F | Resp 18 | Ht 64.0 in | Wt 246.8 lb

## 2019-11-10 DIAGNOSIS — C50919 Malignant neoplasm of unspecified site of unspecified female breast: Secondary | ICD-10-CM

## 2019-11-10 DIAGNOSIS — Z79899 Other long term (current) drug therapy: Secondary | ICD-10-CM | POA: Insufficient documentation

## 2019-11-10 DIAGNOSIS — Z79811 Long term (current) use of aromatase inhibitors: Secondary | ICD-10-CM | POA: Diagnosis not present

## 2019-11-10 DIAGNOSIS — Z85820 Personal history of malignant melanoma of skin: Secondary | ICD-10-CM | POA: Insufficient documentation

## 2019-11-10 DIAGNOSIS — Z170 Estrogen receptor positive status [ER+]: Secondary | ICD-10-CM

## 2019-11-10 DIAGNOSIS — C50112 Malignant neoplasm of central portion of left female breast: Secondary | ICD-10-CM | POA: Insufficient documentation

## 2019-11-10 DIAGNOSIS — Z9013 Acquired absence of bilateral breasts and nipples: Secondary | ICD-10-CM | POA: Diagnosis not present

## 2019-11-10 LAB — CMP (CANCER CENTER ONLY)
ALT: 21 U/L (ref 0–44)
AST: 12 U/L — ABNORMAL LOW (ref 15–41)
Albumin: 4 g/dL (ref 3.5–5.0)
Alkaline Phosphatase: 49 U/L (ref 38–126)
Anion gap: 13 (ref 5–15)
BUN: 11 mg/dL (ref 6–20)
CO2: 27 mmol/L (ref 22–32)
Calcium: 9.3 mg/dL (ref 8.9–10.3)
Chloride: 103 mmol/L (ref 98–111)
Creatinine: 0.79 mg/dL (ref 0.44–1.00)
GFR, Est AFR Am: >60 mL/min (ref >60)
GFR, Estimated: >60 mL/min (ref >60)
Glucose, Bld: 114 mg/dL — ABNORMAL HIGH (ref 70–99)
Potassium: 4.3 mmol/L (ref 3.5–5.1)
Sodium: 143 mmol/L (ref 135–145)
Total Bilirubin: 0.4 mg/dL (ref 0.3–1.2)
Total Protein: 7.2 g/dL (ref 6.5–8.1)

## 2019-11-10 LAB — CBC WITH DIFFERENTIAL (CANCER CENTER ONLY)
Abs Immature Granulocytes: 0 10*3/uL (ref 0.00–0.07)
Basophils Absolute: 0.1 10*3/uL (ref 0.0–0.1)
Basophils Relative: 3 %
Eosinophils Absolute: 0.2 10*3/uL (ref 0.0–0.5)
Eosinophils Relative: 11 %
HCT: 37.3 % (ref 36.0–46.0)
Hemoglobin: 12.6 g/dL (ref 12.0–15.0)
Immature Granulocytes: 0 %
Lymphocytes Relative: 28 %
Lymphs Abs: 0.6 10*3/uL — ABNORMAL LOW (ref 0.7–4.0)
MCH: 33.2 pg (ref 26.0–34.0)
MCHC: 33.8 g/dL (ref 30.0–36.0)
MCV: 98.2 fL (ref 80.0–100.0)
Monocytes Absolute: 0.2 10*3/uL (ref 0.1–1.0)
Monocytes Relative: 8 %
Neutro Abs: 1 10*3/uL — ABNORMAL LOW (ref 1.7–7.7)
Neutrophils Relative %: 50 %
Platelet Count: 185 10*3/uL (ref 150–400)
RBC: 3.8 MIL/uL — ABNORMAL LOW (ref 3.87–5.11)
RDW: 16.4 % — ABNORMAL HIGH (ref 11.5–15.5)
WBC Count: 2 10*3/uL — ABNORMAL LOW (ref 4.0–10.5)
nRBC: 0 % (ref 0.0–0.2)

## 2019-11-10 NOTE — Assessment & Plan Note (Signed)
Bone scan 08/07/2019: Increase activity with sclerotic bone metastases involving left femoral neck right ninth rib and entire L5 vertebra  08/22/2019: PET CT scan: Metastatic disease in the chest with multiple lung nodules all subcentimeter size and hypermetabolic lymph nodes (right paratracheal node 7 mm, subcarinal node 1 cm, right hilar node 6 mm), retroperitoneal and upper pelvic common iliac lymph nodes, bone metastases especially L5, L4, T6, right 10th rib, left proximal femur  Bone biopsy was not felt to be reliable in addition to the effect of radiation on the bone. Palliative radiation completed 09/03/2019 [facility] 360: BRCA1 mutation, TMB 5.36, MSI: Normal  Treatment plan: Ibrance with letrozole and Xgeva for bone metastases Letrozole toxicities:Tolerating letrozole extremely well without any adverse effects  Ibrance toxicities: 1.  Fatigue 2.  Low white blood cell count  Lab review:   Return to clinic in 1 month with labs and follow-up.

## 2019-11-12 ENCOUNTER — Other Ambulatory Visit: Payer: Self-pay | Admitting: Pharmacist

## 2019-11-12 DIAGNOSIS — Z170 Estrogen receptor positive status [ER+]: Secondary | ICD-10-CM

## 2019-11-12 DIAGNOSIS — C50112 Malignant neoplasm of central portion of left female breast: Secondary | ICD-10-CM

## 2019-11-12 MED ORDER — PALBOCICLIB 125 MG PO TABS: 125.0000 mg | ORAL_TABLET | Freq: Every day | ORAL | 1 refills | Status: DC

## 2019-11-12 NOTE — Progress Notes (Signed)
Oral Chemotherapy Pharmacist Encounter  Due to insurance restriction the [facility] can no longer be filled at [facility]. Prescription has been redirected to [facility].  Supportive information was faxed to [facility]. Patient notified of the change in pharmacy.  Darl Pikes, PharmD, BCPS, [facility] Hematology/Oncology Clinical Pharmacist [facility]/HP/AP Oral [facility] [phone]  11/12/2019 9:29 AM

## 2019-11-13 ENCOUNTER — Telehealth: Payer: Self-pay | Admitting: Hematology and Oncology

## 2019-11-13 NOTE — Telephone Encounter (Signed)
Scheduled per 04/05 los, patient has been called and voicemail has been left.

## 2019-12-10 ENCOUNTER — Other Ambulatory Visit: Payer: Self-pay

## 2019-12-10 ENCOUNTER — Ambulatory Visit (HOSPITAL_COMMUNITY)
Admission: RE | Admit: 2019-12-10 | Discharge: 2019-12-10 | Disposition: A | Discharge status: Home / Self Care | Payer: Commercial Managed Care - PPO | Source: Ambulatory Visit | Attending: Hematology and Oncology | Admitting: Hematology and Oncology

## 2019-12-10 ENCOUNTER — Encounter (HOSPITAL_COMMUNITY): Payer: Self-pay

## 2019-12-10 DIAGNOSIS — C50919 Malignant neoplasm of unspecified site of unspecified female breast: Secondary | ICD-10-CM

## 2019-12-10 MED ORDER — SODIUM CHLORIDE (PF) 0.9 % IJ SOLN
INTRAMUSCULAR | Status: AC
  Filled 2019-12-10: qty 50

## 2019-12-10 MED ORDER — IOHEXOL 300 MG/ML  SOLN
100.0000 mL | Freq: Once | INTRAMUSCULAR | Status: AC | PRN
  Administered 2019-12-10: 100 mL via INTRAVENOUS

## 2019-12-10 NOTE — Progress Notes (Signed)
Patient Care Team: Patient, [name] Krystal Lose, MD as Consulting Physician (Hematology and Oncology) Krystal Pray, MD as Consulting Physician (Radiation Oncology) Krystal Phlegm, NP as Nurse Practitioner (Hematology and Oncology) Krystal Bookbinder, MD as Consulting Physician (General Surgery)  DIAGNOSIS:    ICD-10-CM   1. Malignant neoplasm of central portion of left breast in female, estrogen receptor positive ([name])  C50.112    Z17.0     SUMMARY OF ONCOLOGIC HISTORY: Oncology History  Cancer of central portion of left female breast (Krystal [name])  04/16/2014 Surgery   Bilateral mastectomies: Left breast : Multifocal invasive ductal carcinoma positive for lymphovascular invasion, 1.2 cm and 0.6 cm, grade 3, high-grade DCIS with comedonecrosis, 4 SLN negative T1 C. N0 M0 stage IA: Oncotype DX 13 (ROR 9%)   04/16/2014 Surgery   Left upper back melanoma resected no residual melanoma identified on re-resection margins negative   05/26/2014 - 06/08/2015 Anti-estrogen oral therapy   Tamoxifen 20 mg daily with a plan to switch her to aromatase inhibitors once she gets oophorectomy ( patient did not continue antiestrogen therapy by choice)   08/13/2014 Surgery   oophorectomy with total abdominal hysterectomy   09/08/2015 -  Anti-estrogen oral therapy   Resumed anastrozole after she found recurrence of breast cancer; stopped in January 2018, started letrozole 2.5 mg daily 10/12/2016; switched to Exemestane 25 mg daily on 01/03/17   09/16/2015 Surgery   Skin, left: IDC grade 3; 0.5 cm, margins negative, ER 95%, PR 10%, HER-2 positive ratio 2.08   11/08/2015 - 02/2017 Chemotherapy   Herceptin every 3 weeks plus anastrozole   11/24/2015 - 01/14/2016 Radiation Therapy   Adjuvant radiation therapy by Dr. Sondra Come   01/2017 -  Anti-estrogen oral therapy   Exemestane daily (stopped others due to side effects), due to muscle aches and pains switch to tamoxifen 5  mg daily starting 12/18/2017   08/07/2019 Relapse/Recurrence   Bone scan: Sclerotic metastatic lesions involving left femoral neck, right ninth rib and entire L5 vertebra; MRI left hip: Diffuse signal abnormality L5 vertebral body with possible extraosseous extension   08/22/2019 - 09/03/2019 Radiation Therapy   Palliative radiation with Dr. Sondra Come to L5 and left proixmal femur.      CHIEF COMPLIANT: Follow-up of metastatic breast cancer on Ibrance and letrozole  INTERVAL HISTORY: Krystal [name] is a 54 y.o. with above-mentioned history of metastatic breast cancer with osseous metastases currentlyon treatment with Ibranceand letrozole.CT CAP on 12/10/19 showed decrease in the pulmonary nodule, right iliac node, and increasing sclerosis of osseous metastases. She presents to the clinic todayfor a toxicity check and to review her scans. Her major complaint is fatigue. Otherwise she is tolerating the treatment fairly well. Clinically she has no new complaints or symptoms. She needs to start on bisphosphonate therapy for bone metastases.  ALLERGIES:  is allergic to dilaudid [hydromorphone] and codeine.  MEDICATIONS:  Current Outpatient Medications  Medication Sig Dispense Refill  . calcium-vitamin D (OSCAL WITH D) 500-200 MG-UNIT tablet Take 1 tablet by mouth 2 (two) times daily. 180 tablet 3  . letrozole (FEMARA) 2.5 MG tablet Take 1 tablet (2.5 mg total) by mouth daily. 90 tablet 3  . ondansetron (ZOFRAN) 4 MG tablet Take 1 tablet (4 mg total) by mouth every 8 (eight) hours as needed for nausea or vomiting. 20 tablet 3  . palbociclib (IBRANCE) 125 MG tablet Take 1 tablet (125 mg total) by mouth daily. Take for 21 days on, 7 days  off, repeat every 28 days. 21 tablet 1   No current facility-administered medications for this visit.    PHYSICAL EXAMINATION: ECOG PERFORMANCE STATUS: 1 - Symptomatic but completely ambulatory  There were no vitals filed for this visit. There were no vitals filed  for this visit.  LABORATORY DATA:  I have reviewed the data as listed CMP Latest Ref Rng & Units 11/10/2019 10/09/2019 08/25/2019  Glucose 70 - 99 mg/dL 114(H) 114(H) 109(H)  BUN 6 - 20 mg/dL _0 Creatinine 0.44 - 1.00 mg/dL 0.79 0.83 0.80  Sodium 135 - 145 mmol/L 143 144 141  Potassium 3.5 - 5.1 mmol/L 4.3 4.5 4.2  Chloride 98 - 111 mmol/L 103 105 103  CO2 22 - 32 mmol/L _1 Calcium 8.9 - 10.3 mg/dL 9.3 9.2 8.7(L)  Total Protein 6.5 - 8.1 g/dL 7.2 7.0 7.1  Total Bilirubin 0.3 - 1.2 mg/dL 0.4 0.4 0.4  Alkaline Phos 38 - 126 U/L 49 59 64  AST 15 - 41 U/L 12(L) 18 12(L)  ALT 0 - 44 U/L _2 Lab Results  Component Value Date   WBC 2.6 (L) 12/11/2019   HGB 12.5 12/11/2019   HCT 36.8 12/11/2019   MCV 102.8 (H) 12/11/2019   PLT 229 12/11/2019   NEUTROABS 1.1 (L) 12/11/2019    ASSESSMENT & PLAN:  Cancer of central portion of left female breast Bone scan 08/07/2019: Increase activity with sclerotic bone metastases involving left femoral neck right ninth rib and entire L5 vertebra  08/22/2019: PET CT scan: Metastatic disease in the chest with multiple lung nodules all subcentimeter size and hypermetabolic lymph nodes (right paratracheal node 7 mm, subcarinal node 1 cm, right hilar node 6 mm), retroperitoneal and upper pelvic common iliac lymph nodes, bone metastases especially L5, L4, T6, right 10th rib, left proximal femur  Bone biopsy was not felt to be reliable in addition to the effect of radiation on the bone. Palliative radiation completed 09/03/2019 [facility] 360:BRCA1 mutation, TMB 5.36, MSI: Normal  Treatment plan:Ibrance withletrozoleand Xgeva for bone metastases Letrozole toxicities:Tolerating letrozole extremely well without any adverse effects  Ibrance toxicities: 1.Fatigue: She is trying to manage her life in spite of the fatigue. 2.Low white blood cell count: ANC 1 we will continue on the same dosage. 3.  Hair thinning: We talked about some  remedies for the hair.  Lab review: [place] 1 Patient is planning to go to [facility] I recommended that she get Covid vaccine.  She is not interested at this time.  CT CAP: 12/10/19: Interval decrease in size of previous index pulmonary nodules. Decrease in size of previous borderline enlarged right common iliac node  Return to clinic in 3 months with labs and follow-up after CT chest abdomen pelvis.    No orders of the defined types were placed in this encounter.  The patient has a good understanding of the overall plan. she agrees with it. she will call with any problems that may develop before the next visit here.  Total time spent: 30 mins including face to face time and time spent for planning, charting and coordination of care  Krystal Lose, MD 12/11/2019  I, Cloyde Reams Dorshimer, am acting as scribe for Dr. Nicholas [name].  I have reviewed the above documentation for accuracy and completeness, and I agree with the above.

## 2019-12-11 ENCOUNTER — Other Ambulatory Visit: Payer: Self-pay

## 2019-12-11 ENCOUNTER — Inpatient Hospital Stay: Payer: Commercial Managed Care - PPO | Attending: Hematology and Oncology

## 2019-12-11 ENCOUNTER — Inpatient Hospital Stay: Payer: Commercial Managed Care - PPO | Admitting: Hematology and Oncology

## 2019-12-11 VITALS — BP 135/89 | HR 91 | Temp 99.1°F | Resp 18 | Ht 64.0 in | Wt 248.8 lb

## 2019-12-11 DIAGNOSIS — Z9221 Personal history of antineoplastic chemotherapy: Secondary | ICD-10-CM | POA: Diagnosis not present

## 2019-12-11 DIAGNOSIS — R5383 Other fatigue: Secondary | ICD-10-CM | POA: Insufficient documentation

## 2019-12-11 DIAGNOSIS — C50919 Malignant neoplasm of unspecified site of unspecified female breast: Secondary | ICD-10-CM | POA: Diagnosis not present

## 2019-12-11 DIAGNOSIS — Z79899 Other long term (current) drug therapy: Secondary | ICD-10-CM | POA: Diagnosis not present

## 2019-12-11 DIAGNOSIS — Z85820 Personal history of malignant melanoma of skin: Secondary | ICD-10-CM | POA: Diagnosis not present

## 2019-12-11 DIAGNOSIS — Z923 Personal history of irradiation: Secondary | ICD-10-CM | POA: Insufficient documentation

## 2019-12-11 DIAGNOSIS — Z170 Estrogen receptor positive status [ER+]: Secondary | ICD-10-CM

## 2019-12-11 DIAGNOSIS — C7951 Secondary malignant neoplasm of bone: Secondary | ICD-10-CM | POA: Insufficient documentation

## 2019-12-11 DIAGNOSIS — Z79811 Long term (current) use of aromatase inhibitors: Secondary | ICD-10-CM | POA: Insufficient documentation

## 2019-12-11 DIAGNOSIS — C50112 Malignant neoplasm of central portion of left female breast: Secondary | ICD-10-CM

## 2019-12-11 DIAGNOSIS — Z9013 Acquired absence of bilateral breasts and nipples: Secondary | ICD-10-CM | POA: Insufficient documentation

## 2019-12-11 LAB — CMP (CANCER CENTER ONLY)
ALT: 25 U/L (ref 0–44)
AST: 15 U/L (ref 15–41)
Albumin: 4.1 g/dL (ref 3.5–5.0)
Alkaline Phosphatase: 59 U/L (ref 38–126)
Anion gap: 9 (ref 5–15)
BUN: 17 mg/dL (ref 6–20)
CO2: 29 mmol/L (ref 22–32)
Calcium: 9.3 mg/dL (ref 8.9–10.3)
Chloride: 103 mmol/L (ref 98–111)
Creatinine: 0.79 mg/dL (ref 0.44–1.00)
GFR, Est AFR Am: >60 mL/min (ref >60)
GFR, Estimated: >60 mL/min (ref >60)
Glucose, Bld: 108 mg/dL — ABNORMAL HIGH (ref 70–99)
Potassium: 4.6 mmol/L (ref 3.5–5.1)
Sodium: 141 mmol/L (ref 135–145)
Total Bilirubin: 0.4 mg/dL (ref 0.3–1.2)
Total Protein: 7.3 g/dL (ref 6.5–8.1)

## 2019-12-11 LAB — CBC WITH DIFFERENTIAL (CANCER CENTER ONLY)
Abs Immature Granulocytes: 0.02 10*3/uL (ref 0.00–0.07)
Basophils Absolute: 0.1 10*3/uL (ref 0.0–0.1)
Basophils Relative: 2 %
Eosinophils Absolute: 0.2 10*3/uL (ref 0.0–0.5)
Eosinophils Relative: 7 %
HCT: 36.8 % (ref 36.0–46.0)
Hemoglobin: 12.5 g/dL (ref 12.0–15.0)
Immature Granulocytes: 1 %
Lymphocytes Relative: 33 %
Lymphs Abs: 0.9 10*3/uL (ref 0.7–4.0)
MCH: 34.9 pg — ABNORMAL HIGH (ref 26.0–34.0)
MCHC: 34 g/dL (ref 30.0–36.0)
MCV: 102.8 fL — ABNORMAL HIGH (ref 80.0–100.0)
Monocytes Absolute: 0.4 10*3/uL (ref 0.1–1.0)
Monocytes Relative: 14 %
Neutro Abs: 1.1 10*3/uL — ABNORMAL LOW (ref 1.7–7.7)
Neutrophils Relative %: 43 %
Platelet Count: 229 10*3/uL (ref 150–400)
RBC: 3.58 MIL/uL — ABNORMAL LOW (ref 3.87–5.11)
RDW: 16.2 % — ABNORMAL HIGH (ref 11.5–15.5)
WBC Count: 2.6 10*3/uL — ABNORMAL LOW (ref 4.0–10.5)
nRBC: 0 % (ref 0.0–0.2)

## 2019-12-11 NOTE — Assessment & Plan Note (Signed)
Bone scan 08/07/2019: Increase activity with sclerotic bone metastases involving left femoral neck right ninth rib and entire L5 vertebra  08/22/2019: PET CT scan: Metastatic disease in the chest with multiple lung nodules all subcentimeter size and hypermetabolic lymph nodes (right paratracheal node 7 mm, subcarinal node 1 cm, right hilar node 6 mm), retroperitoneal and upper pelvic common iliac lymph nodes, bone metastases especially L5, L4, T6, right 10th rib, left proximal femur  Bone biopsy was not felt to be reliable in addition to the effect of radiation on the bone. Palliative radiation completed 09/03/2019 [facility] 360:BRCA1 mutation, TMB 5.36, MSI: Normal  Treatment plan:Ibrance withletrozoleand Xgeva for bone metastases Letrozole toxicities:Tolerating letrozole extremely well without any adverse effects  Ibrance toxicities: 1.Fatigue: She is trying to manage her life in spite of the fatigue. 2.Low white blood cell count: ANC 1 we will continue on the same dosage. 3.  Hair thinning: We talked about some remedies for the hair.  Lab review: [place] 1 Patient is planning to go to [facility] I recommended that she get Covid vaccine.  She is not interested at this time.  CT CAP: 12/10/19: Interval decrease in size of previous index pulmonary nodules. Decrease in size of previous borderline enlarged right common iliac node  Return to clinic in 1 month with labs and follow-up after CT chest abdomen pelvis.

## 2019-12-12 ENCOUNTER — Telehealth: Payer: Self-pay | Admitting: Hematology and Oncology

## 2019-12-12 NOTE — Progress Notes (Signed)
Pharmacist Chemotherapy Monitoring - Follow Up Assessment    I verify that I have reviewed each item in the below checklist:  . Regimen for the patient is scheduled for the appropriate day and plan matches scheduled date. Mar[place] Kitchen Appropriate non-routine labs are ordered dependent on drug ordered. . If applicable, additional medications reviewed and ordered per protocol based on lifetime cumulative doses and/or treatment regimen.   Plan for follow-up and/or issues identified: Yes . I-vent associated with next due treatment: Yes . MD and/or nursing notified: No  Krystal [name] 12/12/2019 3:15 PM

## 2019-12-12 NOTE — Telephone Encounter (Signed)
Scheduled per 05/06 los, patient has been called and voicemail was left.

## 2019-12-18 ENCOUNTER — Inpatient Hospital Stay: Payer: Commercial Managed Care - PPO

## 2019-12-18 ENCOUNTER — Other Ambulatory Visit: Payer: Commercial Managed Care - PPO

## 2019-12-26 ENCOUNTER — Other Ambulatory Visit: Payer: Self-pay | Admitting: Hematology and Oncology

## 2019-12-26 ENCOUNTER — Inpatient Hospital Stay: Payer: Commercial Managed Care - PPO

## 2019-12-26 DIAGNOSIS — Z170 Estrogen receptor positive status [ER+]: Secondary | ICD-10-CM

## 2019-12-26 DIAGNOSIS — C50112 Malignant neoplasm of central portion of left female breast: Secondary | ICD-10-CM

## 2019-12-29 ENCOUNTER — Other Ambulatory Visit: Payer: Self-pay | Admitting: Hematology and Oncology

## 2019-12-29 DIAGNOSIS — C50112 Malignant neoplasm of central portion of left female breast: Secondary | ICD-10-CM

## 2019-12-29 DIAGNOSIS — Z170 Estrogen receptor positive status [ER+]: Secondary | ICD-10-CM

## 2019-12-30 ENCOUNTER — Other Ambulatory Visit: Payer: Self-pay | Admitting: *Deleted

## 2019-12-30 DIAGNOSIS — Z170 Estrogen receptor positive status [ER+]: Secondary | ICD-10-CM

## 2019-12-30 DIAGNOSIS — C50112 Malignant neoplasm of central portion of left female breast: Secondary | ICD-10-CM

## 2019-12-30 MED ORDER — PALBOCICLIB 125 MG PO TABS: ORAL_TABLET | ORAL | 12 refills | Status: DC

## 2020-01-23 ENCOUNTER — Other Ambulatory Visit: Payer: Self-pay | Admitting: Internal Medicine

## 2020-03-11 ENCOUNTER — Ambulatory Visit (HOSPITAL_COMMUNITY)
Admission: RE | Admit: 2020-03-11 | Discharge: 2020-03-11 | Disposition: A | Discharge status: Home / Self Care | Payer: Commercial Managed Care - PPO | Source: Ambulatory Visit | Attending: Hematology and Oncology | Admitting: Hematology and Oncology

## 2020-03-11 ENCOUNTER — Other Ambulatory Visit: Payer: Self-pay

## 2020-03-11 DIAGNOSIS — C50112 Malignant neoplasm of central portion of left female breast: Secondary | ICD-10-CM | POA: Insufficient documentation

## 2020-03-11 DIAGNOSIS — Z170 Estrogen receptor positive status [ER+]: Secondary | ICD-10-CM

## 2020-03-11 DIAGNOSIS — C50919 Malignant neoplasm of unspecified site of unspecified female breast: Secondary | ICD-10-CM | POA: Insufficient documentation

## 2020-03-11 MED ORDER — IOHEXOL 300 MG/ML  SOLN
100.0000 mL | Freq: Once | INTRAMUSCULAR | Status: AC | PRN
  Administered 2020-03-11: 100 mL via INTRAVENOUS

## 2020-03-11 MED ORDER — SODIUM CHLORIDE (PF) 0.9 % IJ SOLN
INTRAMUSCULAR | Status: AC
  Filled 2020-03-11: qty 50

## 2020-03-12 ENCOUNTER — Other Ambulatory Visit: Payer: Commercial Managed Care - PPO

## 2020-03-12 ENCOUNTER — Ambulatory Visit: Payer: Commercial Managed Care - PPO

## 2020-03-12 ENCOUNTER — Other Ambulatory Visit: Payer: Self-pay | Admitting: *Deleted

## 2020-03-12 DIAGNOSIS — C50112 Malignant neoplasm of central portion of left female breast: Secondary | ICD-10-CM

## 2020-03-12 DIAGNOSIS — Z170 Estrogen receptor positive status [ER+]: Secondary | ICD-10-CM

## 2020-03-14 NOTE — Progress Notes (Signed)
Patient Care Team: Patient, [name] [name], MD as Consulting Physician (Hematology and Oncology) [name], MD as Consulting Physician (Radiation Oncology) Gardenia Phlegm, NP as Nurse Practitioner (Hematology and Oncology) Rolm Bookbinder, MD as Consulting Physician (General Surgery)  DIAGNOSIS:    ICD-10-CM   1. Malignant neoplasm of central portion of left breast in female, estrogen receptor positive ([place])  C50.112    Z17.0     SUMMARY OF ONCOLOGIC HISTORY: Oncology History  Cancer of central portion of left female breast ([place])  04/16/2014 Surgery   Bilateral mastectomies: Left breast : Multifocal invasive ductal carcinoma positive for lymphovascular invasion, 1.2 cm and 0.6 cm, grade 3, high-grade DCIS with comedonecrosis, 4 SLN negative T1 C. N0 M0 stage IA: Oncotype DX 13 (ROR 9%)   04/16/2014 Surgery   Left upper back melanoma resected no residual melanoma identified on re-resection margins negative   05/26/2014 - 06/08/2015 Anti-estrogen oral therapy   Tamoxifen 20 mg daily with a plan to switch her to aromatase inhibitors once she gets oophorectomy ( patient did not continue antiestrogen therapy by choice)   08/13/2014 Surgery   oophorectomy with total abdominal hysterectomy   09/08/2015 -  Anti-estrogen oral therapy   Resumed anastrozole after she found recurrence of breast cancer; stopped in January 2018, started letrozole 2.5 mg daily 10/12/2016; switched to Exemestane 25 mg daily on 01/03/17   09/16/2015 Surgery   Skin, left: IDC grade 3; 0.5 cm, margins negative, ER 95%, PR 10%, HER-2 positive ratio 2.08   11/08/2015 - 02/2017 Chemotherapy   Herceptin every 3 weeks plus anastrozole   11/24/2015 - 01/14/2016 Radiation Therapy   Adjuvant radiation therapy by Dr. Sondra Come   01/2017 -  Anti-estrogen oral therapy   Exemestane daily (stopped others due to side effects), due to muscle aches and pains switch to tamoxifen 5  mg daily starting 12/18/2017   08/07/2019 Relapse/Recurrence   Bone scan: Sclerotic metastatic lesions involving left femoral neck, right ninth rib and entire L5 vertebra; MRI left hip: Diffuse signal abnormality L5 vertebral body with possible extraosseous extension   08/22/2019 - 09/03/2019 Radiation Therapy   Palliative radiation with Dr. Sondra Come to L5 and left proixmal femur.      CHIEF COMPLIANT: Follow-up of metastatic breast cancer on Ibrance and letrozole  INTERVAL HISTORY: Krystal [name] is a 54 y.o. with above-mentioned history of metastatic breast cancer with osseous metastases currentlyon treatment with Ibranceand letrozole.CT CAP on 03/11/20 showed stable to decrease in size of pulmonary nodules, stable osseous metastases, and stable to decrease size of left adrenal nodule. She presents to the clinic todayfor a toxicity check and to review her scans. Her major complaints are fatigue and inability to lose weight.  She also has trouble concentrating and with memory.   ALLERGIES:  is allergic to dilaudid [hydromorphone] and codeine.  MEDICATIONS:  Current Outpatient Medications  Medication Sig Dispense Refill  . calcium-vitamin D (OSCAL WITH D) 500-200 MG-UNIT tablet Take 1 tablet by mouth 2 (two) times daily. 180 tablet 3  . letrozole (FEMARA) 2.5 MG tablet Take 1 tablet (2.5 mg total) by mouth daily. 90 tablet 3  . ondansetron (ZOFRAN) 4 MG tablet Take 1 tablet (4 mg total) by mouth every 8 (eight) hours as needed for nausea or vomiting. 20 tablet 3  . palbociclib (IBRANCE) 125 MG tablet TAKE 1 TABLET DAILY FOR 21 DAYS ON, 7 DAYS OFF, REPEAT EVERY 28 DAYS 21 tablet 12   No  current facility-administered medications for this visit.    PHYSICAL EXAMINATION: ECOG PERFORMANCE STATUS: 1 - Symptomatic but completely ambulatory  Vitals:   03/15/20 0954  BP: 121/71  Pulse: 95  Resp: 17  Temp: 98.1 F (36.7 C)  SpO2: 99%   Filed Weights   03/15/20 0954  Weight: 244 lb 11.2  oz (111 kg)    LABORATORY DATA:  I have reviewed the data as listed CMP Latest Ref Rng & Units 03/15/2020 12/11/2019 11/10/2019  Glucose 70 - 99 mg/dL 106(H) 108(H) 114(H)  BUN 6 - 20 mg/dL _0 Creatinine 0.44 - 1.00 mg/dL 0.90 0.79 0.79  Sodium 135 - 145 mmol/L 141 141 143  Potassium 3.5 - 5.1 mmol/L 4.5 4.6 4.3  Chloride 98 - 111 mmol/L 103 103 103  CO2 22 - 32 mmol/L _1 Calcium 8.9 - 10.3 mg/dL 10.0 9.3 9.3  Total Protein 6.5 - 8.1 g/dL 7.4 7.3 7.2  Total Bilirubin 0.3 - 1.2 mg/dL 0.5 0.4 0.4  Alkaline Phos 38 - 126 U/L 55 59 49  AST 15 - 41 U/L 23 15 12(L)  ALT 0 - 44 U/L 36 25 21    Lab Results  Component Value Date   WBC 2.4 (L) 03/15/2020   HGB 13.3 03/15/2020   HCT 38.8 03/15/2020   MCV 104.9 (H) 03/15/2020   PLT 253 03/15/2020   NEUTROABS PENDING 03/15/2020    ASSESSMENT & PLAN:  Cancer of central portion of left female breast Bone scan 08/07/2019: Increase activity with sclerotic bone metastases involving left femoral neck right ninth rib and entire L5 vertebra  08/22/2019: PET CT scan: Metastatic disease in the chest with multiple lung nodules all subcentimeter size and hypermetabolic lymph nodes (right paratracheal node 7 mm, subcarinal node 1 cm, right hilar node 6 mm), retroperitoneal and upper pelvic common iliac lymph nodes, bone metastases especially L5, L4, T6, right 10th rib, left proximal femur  Bone biopsy was not felt to be reliable in addition to the effect of radiation on the bone. Palliative radiation completed 09/03/2019 [facility] 360:BRCA1 mutation, TMB 5.36, MSI: Normal  Treatment plan:Ibrance withletrozoleand Xgeva for bone metastases Letrozole toxicities:Tolerating letrozole extremely well without any adverse effects  Ibrance toxicities: 1.Fatigue: She is trying to manage her life in spite of the fatigue. 2.Low white blood cell count: ANC 1 we will continue on the same dosage. 3.Hair thinning: We talked about some  remedies for the hair.  Lab review:[place] 1 Patient is planning to go to [facility] I recommended that she get Covid vaccine. She is not interested at this time.  CT CAP: 03/11/20:  No evidence of breast cancer progression.  Stable to decrease in size of bilateral lung nodules, stable sclerotic bone metastases, decreased left adrenal nodule  Return to clinic in  3 months with labs and follow-up    No orders of the defined types were placed in this encounter.  The patient has a good understanding of the overall plan. she agrees with it. she will call with any problems that may develop before the next visit here.  Total time spent: 30 mins including face to face time and time spent for planning, charting and coordination of care  [name], MD 03/15/2020  I, Cloyde Reams Dorshimer, am acting as scribe for Dr. [name].  I have reviewed the above documentation for accuracy and completeness, and I agree with the above.

## 2020-03-15 ENCOUNTER — Other Ambulatory Visit: Payer: Self-pay

## 2020-03-15 ENCOUNTER — Inpatient Hospital Stay: Payer: Commercial Managed Care - PPO | Admitting: Hematology and Oncology

## 2020-03-15 ENCOUNTER — Inpatient Hospital Stay: Payer: Commercial Managed Care - PPO | Attending: Hematology and Oncology

## 2020-03-15 ENCOUNTER — Telehealth: Payer: Self-pay | Admitting: Hematology and Oncology

## 2020-03-15 DIAGNOSIS — R918 Other nonspecific abnormal finding of lung field: Secondary | ICD-10-CM | POA: Insufficient documentation

## 2020-03-15 DIAGNOSIS — R634 Abnormal weight loss: Secondary | ICD-10-CM | POA: Insufficient documentation

## 2020-03-15 DIAGNOSIS — Z9013 Acquired absence of bilateral breasts and nipples: Secondary | ICD-10-CM | POA: Insufficient documentation

## 2020-03-15 DIAGNOSIS — D72819 Decreased white blood cell count, unspecified: Secondary | ICD-10-CM | POA: Diagnosis not present

## 2020-03-15 DIAGNOSIS — C7951 Secondary malignant neoplasm of bone: Secondary | ICD-10-CM | POA: Diagnosis not present

## 2020-03-15 DIAGNOSIS — Z170 Estrogen receptor positive status [ER+]: Secondary | ICD-10-CM

## 2020-03-15 DIAGNOSIS — Z9221 Personal history of antineoplastic chemotherapy: Secondary | ICD-10-CM | POA: Diagnosis not present

## 2020-03-15 DIAGNOSIS — Z79811 Long term (current) use of aromatase inhibitors: Secondary | ICD-10-CM | POA: Diagnosis not present

## 2020-03-15 DIAGNOSIS — C50112 Malignant neoplasm of central portion of left female breast: Secondary | ICD-10-CM

## 2020-03-15 DIAGNOSIS — Z923 Personal history of irradiation: Secondary | ICD-10-CM | POA: Insufficient documentation

## 2020-03-15 DIAGNOSIS — R5383 Other fatigue: Secondary | ICD-10-CM | POA: Insufficient documentation

## 2020-03-15 LAB — CMP (CANCER CENTER ONLY)
ALT: 36 U/L (ref 0–44)
AST: 23 U/L (ref 15–41)
Albumin: 4.1 g/dL (ref 3.5–5.0)
Alkaline Phosphatase: 55 U/L (ref 38–126)
Anion gap: 10 (ref 5–15)
BUN: 11 mg/dL (ref 6–20)
CO2: 28 mmol/L (ref 22–32)
Calcium: 10 mg/dL (ref 8.9–10.3)
Chloride: 103 mmol/L (ref 98–111)
Creatinine: 0.9 mg/dL (ref 0.44–1.00)
GFR, Est AFR Am: >60 mL/min (ref >60)
GFR, Estimated: >60 mL/min (ref >60)
Glucose, Bld: 106 mg/dL — ABNORMAL HIGH (ref 70–99)
Potassium: 4.5 mmol/L (ref 3.5–5.1)
Sodium: 141 mmol/L (ref 135–145)
Total Bilirubin: 0.5 mg/dL (ref 0.3–1.2)
Total Protein: 7.4 g/dL (ref 6.5–8.1)

## 2020-03-15 LAB — CBC WITH DIFFERENTIAL (CANCER CENTER ONLY)
Abs Immature Granulocytes: 0.01 10*3/uL (ref 0.00–0.07)
Basophils Absolute: 0.1 10*3/uL (ref 0.0–0.1)
Basophils Relative: 2 %
Eosinophils Absolute: 0.1 10*3/uL (ref 0.0–0.5)
Eosinophils Relative: 6 %
HCT: 38.8 % (ref 36.0–46.0)
Hemoglobin: 13.3 g/dL (ref 12.0–15.0)
Immature Granulocytes: 0 %
Lymphocytes Relative: 27 %
Lymphs Abs: 0.7 10*3/uL (ref 0.7–4.0)
MCH: 35.9 pg — ABNORMAL HIGH (ref 26.0–34.0)
MCHC: 34.3 g/dL (ref 30.0–36.0)
MCV: 104.9 fL — ABNORMAL HIGH (ref 80.0–100.0)
Monocytes Absolute: 0.2 10*3/uL (ref 0.1–1.0)
Monocytes Relative: 7 %
Neutro Abs: 1.4 10*3/uL — ABNORMAL LOW (ref 1.7–7.7)
Neutrophils Relative %: 58 %
Platelet Count: 253 10*3/uL (ref 150–400)
RBC: 3.7 MIL/uL — ABNORMAL LOW (ref 3.87–5.11)
RDW: 12.9 % (ref 11.5–15.5)
WBC Count: 2.4 10*3/uL — ABNORMAL LOW (ref 4.0–10.5)
nRBC: 0 % (ref 0.0–0.2)

## 2020-03-15 NOTE — Assessment & Plan Note (Signed)
Bone scan 08/07/2019: Increase activity with sclerotic bone metastases involving left femoral neck right ninth rib and entire L5 vertebra  08/22/2019: PET CT scan: Metastatic disease in the chest with multiple lung nodules all subcentimeter size and hypermetabolic lymph nodes (right paratracheal node 7 mm, subcarinal node 1 cm, right hilar node 6 mm), retroperitoneal and upper pelvic common iliac lymph nodes, bone metastases especially L5, L4, T6, right 10th rib, left proximal femur  Bone biopsy was not felt to be reliable in addition to the effect of radiation on the bone. Palliative radiation completed 09/03/2019 Guardant 360:BRCA1 mutation, TMB 5.36, MSI: Normal  Treatment plan:Ibrance withletrozoleand Xgeva for bone metastases Letrozole toxicities:Tolerating letrozole extremely well without any adverse effects  Ibrance toxicities: 1.Fatigue: She is trying to manage her life in spite of the fatigue. 2.Low white blood cell count: ANC 1 we will continue on the same dosage. 3.Hair thinning: We talked about some remedies for the hair.  Lab review:ANC 1 Patient is planning to go to [facility] I recommended that she get Covid vaccine. She is not interested at this time.  CT CAP: 03/11/20:  No evidence of breast cancer progression.  Stable to decrease in size of bilateral lung nodules, stable sclerotic bone metastases, decreased left adrenal nodule  Return to clinic in  6 months with labs and follow-up

## 2020-03-15 NOTE — Telephone Encounter (Signed)
Scheduled appts per 8/9 los. Pt declined printout of AVS.

## 2020-04-22 ENCOUNTER — Encounter: Payer: Self-pay | Admitting: Genetic Counselor

## 2020-04-22 DIAGNOSIS — Z1379 Encounter for other screening for genetic and chromosomal anomalies: Secondary | ICD-10-CM | POA: Insufficient documentation

## 2020-04-22 NOTE — Progress Notes (Signed)
BRCA1 [id number]_[id number] pathogenic mutation identified on gene sequce and del/dup analyses of BRCA1 and BRCA2.  BRCA1 p.H1283R VUS identified as well.  The report date is 04/02/2014.  UPDATE: BRCA1 S.X1155M VUS has been reclassified as a likely benign variant.  The reclassification date is 04/21/2020.

## 2020-06-14 ENCOUNTER — Other Ambulatory Visit: Payer: Self-pay | Admitting: *Deleted

## 2020-06-14 DIAGNOSIS — Z170 Estrogen receptor positive status [ER+]: Secondary | ICD-10-CM

## 2020-06-14 DIAGNOSIS — C50112 Malignant neoplasm of central portion of left female breast: Secondary | ICD-10-CM

## 2020-06-15 ENCOUNTER — Ambulatory Visit: Payer: Commercial Managed Care - PPO | Admitting: Hematology and Oncology

## 2020-06-15 ENCOUNTER — Other Ambulatory Visit: Payer: Commercial Managed Care - PPO

## 2020-06-21 NOTE — Progress Notes (Signed)
Patient Care Team: Patient, [name] [name], MD as Consulting Physician (Hematology and Oncology) [name], MD as Consulting Physician (Radiation Oncology) Delice Bison, Charlestine Massed, NP as Nurse Practitioner (Hematology and Oncology) Rolm Bookbinder, MD as Consulting Physician (General Surgery)  DIAGNOSIS:    ICD-10-CM   1. Malignant neoplasm of central portion of left breast in female, estrogen receptor positive ([place])  C50.112    Z17.0     SUMMARY OF ONCOLOGIC HISTORY: Oncology History  Cancer of central portion of left female breast ([place])  04/02/2014 Genetic Testing   BRCA1 c.68_[id number] pathogenic mutation identified on gene sequce and del/dup analyses of BRCA1 and BRCA2.  BRCA1 p.H1283R VUS identified as well.  The report date is 04/02/2014.  UPDATE: BRCA1 U.U7253G VUS has been reclassified as a likely benign variant.  The reclassification date is 04/21/2020.   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 N0 M0 stage IA: Oncotype DX 13 (ROR 9%)   04/16/2014 Surgery   Left upper back melanoma resected no residual melanoma identified on re-resection margins negative   05/26/2014 - 06/08/2015 Anti-estrogen oral therapy   Tamoxifen 20 mg daily with a plan to switch her to aromatase inhibitors once she gets oophorectomy ( patient did not continue antiestrogen therapy by choice)   08/13/2014 Surgery   oophorectomy with total abdominal hysterectomy   09/08/2015 -  Anti-estrogen oral therapy   Resumed anastrozole after she found recurrence of breast cancer; stopped in January 2018, started letrozole 2.5 mg daily 10/12/2016; switched to Exemestane 25 mg daily on 01/03/17   09/16/2015 Surgery   Skin, left: IDC grade 3; 0.5 cm, margins negative, ER 95%, PR 10%, HER-2 positive  ratio 2.08   11/08/2015 - 02/2017 Chemotherapy   Herceptin every 3 weeks plus anastrozole   11/24/2015 - 01/14/2016 Radiation Therapy   Adjuvant radiation therapy by Dr. Sondra Come   01/2017 -  Anti-estrogen oral therapy   Exemestane daily (stopped others due to side effects), due to muscle aches and pains switch to tamoxifen 5 mg daily starting 12/18/2017   08/07/2019 Relapse/Recurrence   Bone scan: Sclerotic metastatic lesions involving left femoral neck, right ninth rib and entire L5 vertebra; MRI left hip: Diffuse signal abnormality L5 vertebral body with possible extraosseous extension   08/22/2019 - 09/03/2019 Radiation Therapy   Palliative radiation with Dr. Sondra Come to L5 and left proixmal femur.      CHIEF COMPLIANT: Follow-up of metastatic breast cancer on Ibrance and letrozole  INTERVAL HISTORY: Krystal [name] is a 54 y.o. with above-mentioned history of metastatic breast cancer with osseous metastases currentlyon treatment with Ibranceand letrozole. She presents to the clinic todayfor a toxicity check.  ALLERGIES:  is allergic to dilaudid [hydromorphone] and codeine.  MEDICATIONS:  Current Outpatient Medications  Medication Sig Dispense Refill  . calcium-vitamin D (OSCAL WITH D) 500-200 MG-UNIT tablet Take 1 tablet by mouth 2 (two) times daily. 180 tablet 3  . letrozole (FEMARA) 2.5 MG tablet Take 1 tablet (2.5 mg total) by mouth daily. 90 tablet 3  . ondansetron (ZOFRAN) 4 MG tablet Take 1 tablet (4 mg total) by mouth every 8 (eight) hours as needed for nausea or vomiting. 20 tablet 3  . palbociclib (IBRANCE) 125 MG tablet TAKE 1 TABLET DAILY FOR 21 DAYS ON, 7 DAYS OFF, REPEAT EVERY 28 DAYS 21  tablet 12   No current facility-administered medications for this visit.    PHYSICAL EXAMINATION: ECOG PERFORMANCE STATUS: 1 - Symptomatic but completely ambulatory  Vitals:   06/22/20 1026  BP: 122/80  Pulse: 75  Resp: 18  Temp: 98.7 F (37.1 C)  SpO2: 97%   Filed Weights    06/22/20 1026  Weight: 246 lb 1.6 oz (111.6 kg)    LABORATORY DATA:  I have reviewed the data as listed CMP Latest Ref Rng & Units 06/22/2020 03/15/2020 12/11/2019  Glucose 70 - 99 mg/dL 142(H) 106(H) 108(H)  BUN 6 - 20 mg/dL $Remove'11 11 17  'CjVLlrE$ Creatinine 0.44 - 1.00 mg/dL 0.78 0.90 0.79  Sodium 135 - 145 mmol/L 142 141 141  Potassium 3.5 - 5.1 mmol/L 4.4 4.5 4.6  Chloride 98 - 111 mmol/L 103 103 103  CO2 22 - 32 mmol/L $RemoveB'30 28 29  'AcYqQBwB$ Calcium 8.9 - 10.3 mg/dL 9.2 10.0 9.3  Total Protein 6.5 - 8.1 g/dL 7.1 7.4 7.3  Total Bilirubin 0.3 - 1.2 mg/dL 0.4 0.5 0.4  Alkaline Phos 38 - 126 U/L 53 55 59  AST 15 - 41 U/L $Remo'29 23 15  'BpoRr$ ALT 0 - 44 U/L 39 36 25    Lab Results  Component Value Date   WBC 2.2 (L) 06/22/2020   HGB 11.8 (L) 06/22/2020   HCT 34.0 (L) 06/22/2020   MCV 105.6 (H) 06/22/2020   PLT 197 06/22/2020   NEUTROABS 1.2 (L) 06/22/2020    ASSESSMENT & PLAN:  Cancer of central portion of left female breast Bone scan 08/07/2019: Increase activity with sclerotic bone metastases involving left femoral neck right ninth rib and entire L5 vertebra  08/22/2019: PET CT scan: Metastatic disease in the chest with multiple lung nodules all subcentimeter size and hypermetabolic lymph nodes (right paratracheal node 7 mm, subcarinal node 1 cm, right hilar node 6 mm), retroperitoneal and upper pelvic common iliac lymph nodes, bone metastases especially L5, L4, T6, right 10th rib, left proximal femur  Bone biopsy was not felt to be reliable in addition to the effect of radiation on the bone. Palliative radiation completed 09/03/2019 Guardant 360:BRCA1 mutation, TMB 5.36, MSI: Normal  Treatment plan:Ibrance withletrozoleand Xgeva for bone metastases Letrozole toxicities:Tolerating letrozole extremely well without any adverse effects  Ibrance toxicities: 1.Fatigue: She is trying to manage her life in spite of the fatigue. 2.Low white blood cell count: ANC 1 we will continue on the same dosage.  3.Hair thinning: We talked about some remedies for the hair.  I briefly discussed the option of talazoparib for her given that she is BRCA mutation positive. This could be an option if she progresses on Ibrance. Lab review:ANC 1.2   I recommended that she get Covid vaccine. She is not interested at this time.  CT CAP: 03/11/20: No evidence of breast cancer progression.  Stable to decrease in size of bilateral lung nodules, stable sclerotic bone metastases, decreased left adrenal nodule  She cannot take bisphosphonates of her bones because of jaw issues. Return to clinic in 3 months with labs, scans and follow-up    No orders of the defined types were placed in this encounter.  The patient has a good understanding of the overall plan. she agrees with it. she will call with any problems that may develop before the next visit here.  Total time spent: 30 mins including face to face time and time spent for planning, charting and coordination of care  [name], MD 06/22/2020  I, Cloyde Reams Dorshimer,  am acting as scribe for Dr. [name].  I have reviewed the above documentation for accuracy and completeness, and I agree with the above.

## 2020-06-22 ENCOUNTER — Inpatient Hospital Stay: Payer: Commercial Managed Care - PPO | Admitting: Hematology and Oncology

## 2020-06-22 ENCOUNTER — Other Ambulatory Visit: Payer: Self-pay

## 2020-06-22 ENCOUNTER — Inpatient Hospital Stay: Payer: Commercial Managed Care - PPO | Attending: Hematology and Oncology

## 2020-06-22 VITALS — BP 122/80 | HR 75 | Temp 98.7°F | Resp 18 | Ht 64.0 in | Wt 246.1 lb

## 2020-06-22 DIAGNOSIS — R5383 Other fatigue: Secondary | ICD-10-CM | POA: Insufficient documentation

## 2020-06-22 DIAGNOSIS — Z79811 Long term (current) use of aromatase inhibitors: Secondary | ICD-10-CM | POA: Insufficient documentation

## 2020-06-22 DIAGNOSIS — Z85820 Personal history of malignant melanoma of skin: Secondary | ICD-10-CM | POA: Diagnosis not present

## 2020-06-22 DIAGNOSIS — Z1501 Genetic susceptibility to malignant neoplasm of breast: Secondary | ICD-10-CM | POA: Insufficient documentation

## 2020-06-22 DIAGNOSIS — C50112 Malignant neoplasm of central portion of left female breast: Secondary | ICD-10-CM | POA: Insufficient documentation

## 2020-06-22 DIAGNOSIS — C50919 Malignant neoplasm of unspecified site of unspecified female breast: Secondary | ICD-10-CM

## 2020-06-22 DIAGNOSIS — Z170 Estrogen receptor positive status [ER+]: Secondary | ICD-10-CM

## 2020-06-22 DIAGNOSIS — Z923 Personal history of irradiation: Secondary | ICD-10-CM | POA: Insufficient documentation

## 2020-06-22 DIAGNOSIS — Z79899 Other long term (current) drug therapy: Secondary | ICD-10-CM | POA: Insufficient documentation

## 2020-06-22 DIAGNOSIS — C778 Secondary and unspecified malignant neoplasm of lymph nodes of multiple regions: Secondary | ICD-10-CM | POA: Diagnosis not present

## 2020-06-22 DIAGNOSIS — Z9221 Personal history of antineoplastic chemotherapy: Secondary | ICD-10-CM | POA: Diagnosis not present

## 2020-06-22 DIAGNOSIS — C7951 Secondary malignant neoplasm of bone: Secondary | ICD-10-CM | POA: Diagnosis not present

## 2020-06-22 LAB — CMP (CANCER CENTER ONLY)
ALT: 39 U/L (ref 0–44)
AST: 29 U/L (ref 15–41)
Albumin: 3.8 g/dL (ref 3.5–5.0)
Alkaline Phosphatase: 53 U/L (ref 38–126)
Anion gap: 9 (ref 5–15)
BUN: 11 mg/dL (ref 6–20)
CO2: 30 mmol/L (ref 22–32)
Calcium: 9.2 mg/dL (ref 8.9–10.3)
Chloride: 103 mmol/L (ref 98–111)
Creatinine: 0.78 mg/dL (ref 0.44–1.00)
GFR, Estimated: >60 mL/min (ref >60)
Glucose, Bld: 142 mg/dL — ABNORMAL HIGH (ref 70–99)
Potassium: 4.4 mmol/L (ref 3.5–5.1)
Sodium: 142 mmol/L (ref 135–145)
Total Bilirubin: 0.4 mg/dL (ref 0.3–1.2)
Total Protein: 7.1 g/dL (ref 6.5–8.1)

## 2020-06-22 LAB — CBC WITH DIFFERENTIAL (CANCER CENTER ONLY)
Abs Immature Granulocytes: 0 10*3/uL (ref 0.00–0.07)
Basophils Absolute: 0 10*3/uL (ref 0.0–0.1)
Basophils Relative: 2 %
Eosinophils Absolute: 0.1 10*3/uL (ref 0.0–0.5)
Eosinophils Relative: 4 %
HCT: 34 % — ABNORMAL LOW (ref 36.0–46.0)
Hemoglobin: 11.8 g/dL — ABNORMAL LOW (ref 12.0–15.0)
Immature Granulocytes: 0 %
Lymphocytes Relative: 31 %
Lymphs Abs: 0.7 10*3/uL (ref 0.7–4.0)
MCH: 36.6 pg — ABNORMAL HIGH (ref 26.0–34.0)
MCHC: 34.7 g/dL (ref 30.0–36.0)
MCV: 105.6 fL — ABNORMAL HIGH (ref 80.0–100.0)
Monocytes Absolute: 0.3 10*3/uL (ref 0.1–1.0)
Monocytes Relative: 12 %
Neutro Abs: 1.2 10*3/uL — ABNORMAL LOW (ref 1.7–7.7)
Neutrophils Relative %: 51 %
Platelet Count: 197 10*3/uL (ref 150–400)
RBC: 3.22 MIL/uL — ABNORMAL LOW (ref 3.87–5.11)
RDW: 12.9 % (ref 11.5–15.5)
WBC Count: 2.2 10*3/uL — ABNORMAL LOW (ref 4.0–10.5)
nRBC: 0 % (ref 0.0–0.2)

## 2020-06-22 NOTE — Assessment & Plan Note (Signed)
Bone scan 08/07/2019: Increase activity with sclerotic bone metastases involving left femoral neck right ninth rib and entire L5 vertebra  08/22/2019: PET CT scan: Metastatic disease in the chest with multiple lung nodules all subcentimeter size and hypermetabolic lymph nodes (right paratracheal node 7 mm, subcarinal node 1 cm, right hilar node 6 mm), retroperitoneal and upper pelvic common iliac lymph nodes, bone metastases especially L5, L4, T6, right 10th rib, left proximal femur  Bone biopsy was not felt to be reliable in addition to the effect of radiation on the bone. Palliative radiation completed 09/03/2019 Guardant 360:BRCA1 mutation, TMB 5.36, MSI: Normal  Treatment plan:Ibrance withletrozoleand Xgeva for bone metastases Letrozole toxicities:Tolerating letrozole extremely well without any adverse effects  Ibrance toxicities: 1.Fatigue: She is trying to manage her life in spite of the fatigue. 2.Low white blood cell count: ANC 1 we will continue on the same dosage. 3.Hair thinning: We talked about some remedies for the hair.  Lab review:ANC 1 Patient is planning to go to [facility] I recommended that she get Covid vaccine. She is not interested at this time.  CT CAP: 03/11/20: No evidence of breast cancer progression.  Stable to decrease in size of bilateral lung nodules, stable sclerotic bone metastases, decreased left adrenal nodule  Return to clinic in 3 months with labs, scans and follow-up

## 2020-08-04 ENCOUNTER — Telehealth: Payer: Self-pay | Admitting: *Deleted

## 2020-08-04 NOTE — Telephone Encounter (Signed)
Pt with hx of metastatic breast cancer to the bone calling to request prescription for Meloxicam 7.5 mg daily tablet.  Pt states she is experiencing increase in left hip pain due to working and stress on her joints throughout the day.  Pt states she found an old prescription prescribed to her over 2 years ago for Meloxicam 7.5 mg p.o and is taking daily at night prior to bed which has help tremendously with left hip pain. RN will review with MD.

## 2020-08-09 ENCOUNTER — Telehealth: Payer: Self-pay

## 2020-08-09 ENCOUNTER — Other Ambulatory Visit: Payer: Self-pay | Admitting: Hematology and Oncology

## 2020-08-09 MED ORDER — MELOXICAM 7.5 MG PO TABS
7.5000 mg | ORAL_TABLET | Freq: Every day | ORAL | 3 refills | Status: DC | PRN
Start: 2020-08-09 — End: 2021-01-21

## 2020-08-09 NOTE — Telephone Encounter (Signed)
Pt was notified that Rx for Meloxicam was sent by MD.

## 2020-09-02 ENCOUNTER — Other Ambulatory Visit: Payer: Self-pay | Admitting: Hematology and Oncology

## 2020-09-21 ENCOUNTER — Other Ambulatory Visit: Payer: Commercial Managed Care - PPO

## 2020-09-21 ENCOUNTER — Other Ambulatory Visit: Payer: Self-pay | Admitting: *Deleted

## 2020-09-21 DIAGNOSIS — Z170 Estrogen receptor positive status [ER+]: Secondary | ICD-10-CM

## 2020-09-21 DIAGNOSIS — C50112 Malignant neoplasm of central portion of left female breast: Secondary | ICD-10-CM

## 2020-09-22 ENCOUNTER — Ambulatory Visit: Payer: Commercial Managed Care - PPO | Admitting: Hematology and Oncology

## 2020-09-23 ENCOUNTER — Other Ambulatory Visit: Payer: Self-pay

## 2020-09-23 ENCOUNTER — Inpatient Hospital Stay: Payer: Commercial Managed Care - PPO | Attending: Hematology and Oncology

## 2020-09-23 ENCOUNTER — Encounter (HOSPITAL_COMMUNITY): Payer: Self-pay

## 2020-09-23 ENCOUNTER — Ambulatory Visit (HOSPITAL_COMMUNITY)
Admission: RE | Admit: 2020-09-23 | Discharge: 2020-09-23 | Disposition: A | Discharge status: Home / Self Care | Payer: Commercial Managed Care - PPO | Source: Ambulatory Visit | Attending: Hematology and Oncology | Admitting: Hematology and Oncology

## 2020-09-23 DIAGNOSIS — Z170 Estrogen receptor positive status [ER+]: Secondary | ICD-10-CM

## 2020-09-23 DIAGNOSIS — R5383 Other fatigue: Secondary | ICD-10-CM | POA: Diagnosis not present

## 2020-09-23 DIAGNOSIS — C50919 Malignant neoplasm of unspecified site of unspecified female breast: Secondary | ICD-10-CM

## 2020-09-23 DIAGNOSIS — Z923 Personal history of irradiation: Secondary | ICD-10-CM | POA: Insufficient documentation

## 2020-09-23 DIAGNOSIS — C50112 Malignant neoplasm of central portion of left female breast: Secondary | ICD-10-CM | POA: Diagnosis not present

## 2020-09-23 DIAGNOSIS — C7951 Secondary malignant neoplasm of bone: Secondary | ICD-10-CM | POA: Diagnosis not present

## 2020-09-23 DIAGNOSIS — Z9221 Personal history of antineoplastic chemotherapy: Secondary | ICD-10-CM | POA: Insufficient documentation

## 2020-09-23 DIAGNOSIS — Z79811 Long term (current) use of aromatase inhibitors: Secondary | ICD-10-CM | POA: Insufficient documentation

## 2020-09-23 HISTORY — DX: Type 2 diabetes mellitus without complications: E11.9

## 2020-09-23 HISTORY — DX: Malignant (primary) neoplasm, unspecified: C80.1

## 2020-09-23 LAB — CBC WITH DIFFERENTIAL (CANCER CENTER ONLY)
Abs Immature Granulocytes: 0.01 10*3/uL (ref 0.00–0.07)
Basophils Absolute: 0.1 10*3/uL (ref 0.0–0.1)
Basophils Relative: 3 %
Eosinophils Absolute: 0.1 10*3/uL (ref 0.0–0.5)
Eosinophils Relative: 5 %
HCT: 38.9 % (ref 36.0–46.0)
Hemoglobin: 13.2 g/dL (ref 12.0–15.0)
Immature Granulocytes: 1 %
Lymphocytes Relative: 26 %
Lymphs Abs: 0.6 10*3/uL — ABNORMAL LOW (ref 0.7–4.0)
MCH: 35.5 pg — ABNORMAL HIGH (ref 26.0–34.0)
MCHC: 33.9 g/dL (ref 30.0–36.0)
MCV: 104.6 fL — ABNORMAL HIGH (ref 80.0–100.0)
Monocytes Absolute: 0.1 10*3/uL (ref 0.1–1.0)
Monocytes Relative: 6 %
Neutro Abs: 1.3 10*3/uL — ABNORMAL LOW (ref 1.7–7.7)
Neutrophils Relative %: 59 %
Platelet Count: 278 10*3/uL (ref 150–400)
RBC: 3.72 MIL/uL — ABNORMAL LOW (ref 3.87–5.11)
RDW: 12.7 % (ref 11.5–15.5)
WBC Count: 2.1 10*3/uL — ABNORMAL LOW (ref 4.0–10.5)
nRBC: 0 % (ref 0.0–0.2)

## 2020-09-23 LAB — CMP (CANCER CENTER ONLY)
ALT: 46 U/L — ABNORMAL HIGH (ref 0–44)
AST: 21 U/L (ref 15–41)
Albumin: 4.1 g/dL (ref 3.5–5.0)
Alkaline Phosphatase: 55 U/L (ref 38–126)
Anion gap: 9 (ref 5–15)
BUN: 9 mg/dL (ref 6–20)
CO2: 29 mmol/L (ref 22–32)
Calcium: 9.5 mg/dL (ref 8.9–10.3)
Chloride: 103 mmol/L (ref 98–111)
Creatinine: 0.89 mg/dL (ref 0.44–1.00)
GFR, Estimated: >60 mL/min (ref >60)
Glucose, Bld: 121 mg/dL — ABNORMAL HIGH (ref 70–99)
Potassium: 4.4 mmol/L (ref 3.5–5.1)
Sodium: 141 mmol/L (ref 135–145)
Total Bilirubin: 0.3 mg/dL (ref 0.3–1.2)
Total Protein: 7.5 g/dL (ref 6.5–8.1)

## 2020-09-23 MED ORDER — IOHEXOL 300 MG/ML  SOLN
100.0000 mL | Freq: Once | INTRAMUSCULAR | Status: AC | PRN
  Administered 2020-09-23: 100 mL via INTRAVENOUS

## 2020-09-29 NOTE — Assessment & Plan Note (Signed)
Bone scan 08/07/2019: Increase activity with sclerotic bone metastases involving left femoral neck right ninth rib and entire L5 vertebra  08/22/2019: PET CT scan: Metastatic disease in the chest with multiple lung nodules all subcentimeter size and hypermetabolic lymph nodes (right paratracheal node 7 mm, subcarinal node 1 cm, right hilar node 6 mm), retroperitoneal and upper pelvic common iliac lymph nodes, bone metastases especially L5, L4, T6, right 10th rib, left proximal femur  Bone biopsy was not felt to be reliable in addition to the effect of radiation on the bone. Palliative radiation completed 09/03/2019 Guardant 360:BRCA1 mutation, TMB 5.36, MSI: Normal  Treatment plan:Ibrance withletrozoleand Xgeva for bone metastases Letrozole toxicities:Tolerating letrozole extremely well without any adverse effects  Ibrance toxicities: 1.Fatigue: She is trying to manage her life in spite of the fatigue. 2.Low white blood cell count: ANC 1 we will continue on the same dosage. 3.Hair thinning: We talked about some remedies for the hair.  I briefly discussed the option of talazoparib for her given that she is BRCA mutation positive. This could be an option if she progresses on Ibrance. Lab review:ANC 1.2  CT CAP 09/23/20: Slight int enl of lingular and LLL lung nodules, Stable bone mets,  Plan to recheck with CT in 6 months

## 2020-09-29 NOTE — Progress Notes (Signed)
Patient Care Team: Patient, [name] [name], MD as Consulting Physician (Hematology and Oncology) [name], MD as Consulting Physician (Radiation Oncology) [name], NP as Nurse Practitioner (Hematology and Oncology) Rolm Bookbinder, MD as Consulting Physician (General Surgery)  DIAGNOSIS:    ICD-10-CM   1. Osseous metastasis ([facility])  C79.51   2. Metastatic breast cancer ([place])  C50.919   3. Malignant neoplasm of central portion of left breast in female, estrogen receptor positive ([place])  C50.112    Z17.0     SUMMARY OF ONCOLOGIC HISTORY: Oncology History  Cancer of central portion of left female breast ([place])  04/02/2014 Genetic Testing   BRCA1 c.68_69delAG pathogenic mutation identified on gene sequce and del/dup analyses of BRCA1 and BRCA2.  BRCA1 p.H1283R VUS identified as well.  The report date is 04/02/2014.  UPDATE: BRCA1 L.H7342A VUS has been reclassified as a likely benign variant.  The reclassification date is 04/21/2020.   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 N0 M0 stage IA: Oncotype DX 13 (ROR 9%)   04/16/2014 Surgery   Left upper back melanoma resected no residual melanoma identified on re-resection margins negative   05/26/2014 - 06/08/2015 Anti-estrogen oral therapy   Tamoxifen 20 mg daily with a plan to switch her to aromatase inhibitors once she gets oophorectomy ( patient did not continue antiestrogen therapy by choice)   08/13/2014 Surgery   oophorectomy with total abdominal hysterectomy   09/08/2015 -  Anti-estrogen oral therapy   Resumed anastrozole after she found recurrence of breast cancer; stopped in January 2018, started letrozole 2.5 mg daily 10/12/2016; switched to Exemestane 25 mg daily on 01/03/17   09/16/2015 Surgery    Skin, left: IDC grade 3; 0.5 cm, margins negative, ER 95%, PR 10%, HER-2 positive ratio 2.08   11/08/2015 - 02/2017 Chemotherapy   Herceptin every 3 weeks plus anastrozole   11/24/2015 - 01/14/2016 Radiation Therapy   Adjuvant radiation therapy by Dr. Sondra Come   01/2017 -  Anti-estrogen oral therapy   Exemestane daily (stopped others due to side effects), due to muscle aches and pains switch to tamoxifen 5 mg daily starting 12/18/2017   08/07/2019 Relapse/Recurrence   Bone scan: Sclerotic metastatic lesions involving left femoral neck, right ninth rib and entire L5 vertebra; MRI left hip: Diffuse signal abnormality L5 vertebral body with possible extraosseous extension   08/22/2019 - 09/03/2019 Radiation Therapy   Palliative radiation with Dr. Sondra Come to L5 and left proixmal femur.      CHIEF COMPLIANT: Follow-up of metastatic breast cancer on Ibrance and letrozole  INTERVAL HISTORY: Krystal [name] is a 55 y.o. with above-mentioned history of metastatic breast cancer with osseous metastases currentlyon treatment with Ibranceand letrozole. CT CAP on 09/23/20 showed slight enlargement of the lingular and left lower lobe pulmonary nodules, stable bone lesions, and no new metastases. She presents to the clinic todayfor a toxicity check and to review her scan.  She has had a lot of emotional issues with relation to her family members especially her brothers and mother.  ALLERGIES:  is allergic to dilaudid [hydromorphone] and codeine.  MEDICATIONS:  Current Outpatient Medications  Medication Sig Dispense Refill  . calcium-vitamin D (OSCAL WITH D) 500-200 MG-UNIT tablet Take 1 tablet by mouth 2 (two) times daily. 180 tablet 3  . letrozole (FEMARA) 2.5  MG tablet TAKE 1 TABLET BY MOUTH EVERY DAY 30 tablet 11  . meloxicam (MOBIC) 7.5 MG tablet Take 1 tablet (7.5 mg total) by mouth daily as needed for pain. 30 tablet 3  . ondansetron (ZOFRAN) 4 MG tablet Take 1 tablet (4 mg total) by mouth every 8 (eight)  hours as needed for nausea or vomiting. 20 tablet 3  . palbociclib (IBRANCE) 125 MG tablet TAKE 1 TABLET DAILY FOR 21 DAYS ON, 7 DAYS OFF, REPEAT EVERY 28 DAYS 21 tablet 12   No current facility-administered medications for this visit.    PHYSICAL EXAMINATION: ECOG PERFORMANCE STATUS: 1 - Symptomatic but completely ambulatory  Vitals:   09/30/20 0926  BP: 117/71  Pulse: 85  Resp: 18  Temp: 97.9 F (36.6 C)  SpO2: 96%   Filed Weights   09/30/20 0926  Weight: 249 lb 9.6 oz (113.2 kg)     LABORATORY DATA:  I have reviewed the data as listed CMP Latest Ref Rng & Units 09/23/2020 06/22/2020 03/15/2020  Glucose 70 - 99 mg/dL 121(H) 142(H) 106(H)  BUN 6 - 20 mg/dL 9 11 11   Creatinine 0.44 - 1.00 mg/dL 0.89 0.78 0.90  Sodium 135 - 145 mmol/L 141 142 141  Potassium 3.5 - 5.1 mmol/L 4.4 4.4 4.5  Chloride 98 - 111 mmol/L 103 103 103  CO2 22 - 32 mmol/L 29 30 28   Calcium 8.9 - 10.3 mg/dL 9.5 9.2 10.0  Total Protein 6.5 - 8.1 g/dL 7.5 7.1 7.4  Total Bilirubin 0.3 - 1.2 mg/dL 0.3 0.4 0.5  Alkaline Phos 38 - 126 U/L 55 53 55  AST 15 - 41 U/L 21 29 23   ALT 0 - 44 U/L 46(H) 39 36    Lab Results  Component Value Date   WBC 2.1 (L) 09/23/2020   HGB 13.2 09/23/2020   HCT 38.9 09/23/2020   MCV 104.6 (H) 09/23/2020   PLT 278 09/23/2020   NEUTROABS 1.3 (L) 09/23/2020    ASSESSMENT & PLAN:  Cancer of central portion of left female breast Bone scan 08/07/2019: Increase activity with sclerotic bone metastases involving left femoral neck right ninth rib and entire L5 vertebra  08/22/2019: PET CT scan: Metastatic disease in the chest with multiple lung nodules all subcentimeter size and hypermetabolic lymph nodes (right paratracheal node 7 mm, subcarinal node 1 cm, right hilar node 6 mm), retroperitoneal and upper pelvic common iliac lymph nodes, bone metastases especially L5, L4, T6, right 10th rib, left proximal femur  Bone biopsy was not felt to be reliable in addition to the effect of  radiation on the bone. Palliative radiation completed 09/03/2019 Guardant 360:BRCA1 mutation, TMB 5.36, MSI: Normal  Treatment plan:Ibrance withletrozoleand Xgeva for bone metastases Letrozole toxicities:Tolerating letrozole extremely well without any adverse effects  Ibrance toxicities: 1.Fatigue: She is trying to manage her life in spite of the fatigue. 2.Low white blood cell count: ANC 1 we will continue on the same dosage. 3.Hair thinning: We talked about some remedies for the hair.  I briefly discussed the option of talazoparib for her given that she is BRCA mutation positive. This could be an option if she progresses on Ibrance. Lab review:ANC 1.3  Family issues: Her mother was in the nursing home and was completely neglected and had to be hospitalized for diabetic issues.  She is in constant fights with her brothers who have taken or power of attorney and have not been nice to her at all.  CT CAP 09/23/20: Slight int enl of  lingular and LLL lung nodules, Stable bone mets,  Plan to recheck with CT in 6 months Follow-up in 3 months with labs.     No orders of the defined types were placed in this encounter.  The patient has a good understanding of the overall plan. she agrees with it. she will call with any problems that may develop before the next visit here.  Total time spent: 30 mins including face to face time and time spent for planning, charting and coordination of care  Rulon Eisenmenger, MD, MPH 09/30/2020  I, Cloyde Reams Dorshimer, am acting as scribe for Dr. [name].  I have reviewed the above documentation for accuracy and completeness, and I agree with the above.

## 2020-09-30 ENCOUNTER — Other Ambulatory Visit: Payer: Self-pay

## 2020-09-30 ENCOUNTER — Inpatient Hospital Stay: Payer: Commercial Managed Care - PPO | Admitting: Hematology and Oncology

## 2020-09-30 ENCOUNTER — Telehealth: Payer: Self-pay | Admitting: Hematology and Oncology

## 2020-09-30 VITALS — BP 117/71 | HR 85 | Temp 97.9°F | Resp 18 | Ht 64.0 in | Wt 249.6 lb

## 2020-09-30 DIAGNOSIS — C50112 Malignant neoplasm of central portion of left female breast: Secondary | ICD-10-CM

## 2020-09-30 DIAGNOSIS — C50919 Malignant neoplasm of unspecified site of unspecified female breast: Secondary | ICD-10-CM | POA: Diagnosis not present

## 2020-09-30 DIAGNOSIS — Z170 Estrogen receptor positive status [ER+]: Secondary | ICD-10-CM

## 2020-09-30 DIAGNOSIS — C7951 Secondary malignant neoplasm of bone: Secondary | ICD-10-CM | POA: Diagnosis not present

## 2020-09-30 NOTE — Telephone Encounter (Signed)
Scheduled appts per 2/24 los. Pt declined print out of AVS.

## 2020-11-01 ENCOUNTER — Telehealth: Payer: Self-pay | Admitting: *Deleted

## 2020-11-01 NOTE — Telephone Encounter (Signed)
Received after hours message from pt on 10/29/20 with complaint of nausea, vomiting, and fever.  RN placed call to pt for f/u.  Pt states she believes it was food poisoning and states symptoms resolved by 10/30/20.  Pt states she is able to eat and drink with no difficulties and denies s/s of dehydration.

## 2020-11-03 ENCOUNTER — Telehealth: Payer: Self-pay | Admitting: *Deleted

## 2020-11-03 ENCOUNTER — Other Ambulatory Visit: Payer: Self-pay | Admitting: *Deleted

## 2020-11-03 ENCOUNTER — Encounter: Payer: Self-pay | Admitting: *Deleted

## 2020-11-03 MED ORDER — AMOXICILLIN 500 MG PO TABS
500.0000 mg | ORAL_TABLET | Freq: Two times a day (BID) | ORAL | 0 refills | Status: DC
Start: 2020-11-03 — End: 2021-01-21

## 2020-11-03 NOTE — Progress Notes (Signed)
Per MD pt needing to be placed on Amoxicillin 500 mg BID x 2 weeks for dental infection and to stop Ibrance until tooth is able to be pulled or fixed.  Pt notified and verbalized understanding. RN also faxed fax request to pt Dentist- [facility] [phone]) requesting pt be seen sooner for evaluation and tx of tooth infection.

## 2020-11-03 NOTE — Telephone Encounter (Signed)
Received call from pt with complaint of tooth pain x6 months.  Pt states she was examined by her Dentist today and was told one of her crowns is infected and needing the tooth to be either pulled or replaced.  Pt states apt with oral surgeon is April 14th and requesting advice from MD regarding [facility] and if medication should be held.  RN will review with MD for further recommendations.

## 2020-11-04 ENCOUNTER — Encounter: Payer: Self-pay | Admitting: *Deleted

## 2020-11-17 ENCOUNTER — Telehealth: Payer: Self-pay | Admitting: *Deleted

## 2020-11-17 NOTE — Telephone Encounter (Signed)
Received call from pt stating she underwent dental extraction of two lower left teeth on 11/11/20.  Pt states she is currently on an antibiotic and will follow up with DDS on 11/19/20.  Per MD pt to re start Ibrance 2 weeks post procedure (11/25/20) as long as the oral cavities are well healed with no signs of infection.  If irritation is still present by 11/25/20, pt to wait one additional week prior to re starting Ibrance.   Pt educated and verbalized understanding.

## 2020-11-18 ENCOUNTER — Other Ambulatory Visit: Payer: Self-pay | Admitting: Hematology and Oncology

## 2020-11-18 DIAGNOSIS — Z170 Estrogen receptor positive status [ER+]: Secondary | ICD-10-CM

## 2020-11-18 DIAGNOSIS — C50112 Malignant neoplasm of central portion of left female breast: Secondary | ICD-10-CM

## 2020-12-27 NOTE — Progress Notes (Incomplete)
Patient Care Team: Patient, [name] (Inactive) as PCP - General (General Practice) [name], MD as Consulting Physician (Hematology and Oncology) [name], MD as Consulting Physician (Radiation Oncology) [name], NP as Nurse Practitioner (Hematology and Oncology) Rolm Bookbinder, MD as Consulting Physician (General Surgery)  DIAGNOSIS: No diagnosis found.  SUMMARY OF ONCOLOGIC HISTORY: Oncology History  Cancer of central portion of left female breast ([place])  04/02/2014 Genetic Testing   BRCA1 [id number] pathogenic mutation identified on gene sequce and del/dup analyses of BRCA1 and BRCA2.  BRCA1 p.H1283R VUS identified as well.  The report date is 04/02/2014.  UPDATE: BRCA1 S.T4196Q VUS has been reclassified as a likely benign variant.  The reclassification date is 04/21/2020.   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 N0 M0 stage IA: Oncotype DX 13 (ROR 9%)   04/16/2014 Surgery   Left upper back melanoma resected no residual melanoma identified on re-resection margins negative   05/26/2014 - 06/08/2015 Anti-estrogen oral therapy   Tamoxifen 20 mg daily with a plan to switch her to aromatase inhibitors once she gets oophorectomy ( patient did not continue antiestrogen therapy by choice)   08/13/2014 Surgery   oophorectomy with total abdominal hysterectomy   09/08/2015 -  Anti-estrogen oral therapy   Resumed anastrozole after she found recurrence of breast cancer; stopped in January 2018, started letrozole 2.5 mg daily 10/12/2016; switched to Exemestane 25 mg daily on 01/03/17   09/16/2015 Surgery   Skin, left: IDC grade 3; 0.5 cm, margins negative, ER 95%, PR 10%, HER-2 positive ratio 2.08   11/08/2015 - 02/2017 Chemotherapy   Herceptin every 3 weeks plus anastrozole   11/24/2015 -  01/14/2016 Radiation Therapy   Adjuvant radiation therapy by Dr. Sondra Come   01/2017 -  Anti-estrogen oral therapy   Exemestane daily (stopped others due to side effects), due to muscle aches and pains switch to tamoxifen 5 mg daily starting 12/18/2017   08/07/2019 Relapse/Recurrence   Bone scan: Sclerotic metastatic lesions involving left femoral neck, right ninth rib and entire L5 vertebra; MRI left hip: Diffuse signal abnormality L5 vertebral body with possible extraosseous extension   08/22/2019 - 09/03/2019 Radiation Therapy   Palliative radiation with Dr. Sondra Come to L5 and left proixmal femur.      CHIEF COMPLIANT: Follow-up of metastatic breast cancer on Ibrance and letrozole  INTERVAL HISTORY: Krystal [name] is a 55 y.o. with above-mentioned history of metastatic breast cancer with osseous metastases currentlyon treatment with Ibrance with Xgeva and letrozole. She presents to the clinic todayfor a toxicity check.  ALLERGIES:  is allergic to dilaudid [hydromorphone] and codeine.  MEDICATIONS:  Current Outpatient Medications  Medication Sig Dispense Refill  . amoxicillin (AMOXIL) 500 MG tablet Take 1 tablet (500 mg total) by mouth 2 (two) times daily. 28 tablet 0  . calcium-vitamin D (OSCAL WITH D) 500-200 MG-UNIT tablet Take 1 tablet by mouth 2 (two) times daily. 180 tablet 3  . IBRANCE 125 MG tablet TAKE 1 TABLET DAILY FOR 21 DAYS ON, 7 DAYS OFF REPEAT EVERY 28 DAYS 21 tablet 12  . letrozole (FEMARA) 2.5 MG tablet TAKE 1 TABLET BY MOUTH EVERY DAY 30 tablet 11  . meloxicam (MOBIC) 7.5 MG tablet Take 1 tablet (7.5 mg total) by mouth daily as needed for pain. 30 tablet 3  . ondansetron (ZOFRAN) 4 MG tablet  Take 1 tablet (4 mg total) by mouth every 8 (eight) hours as needed for nausea or vomiting. 20 tablet 3   No current facility-administered medications for this visit.    PHYSICAL EXAMINATION: ECOG PERFORMANCE STATUS: {CHL ONC ECOG PS:[id number]}  There were no vitals filed for this  visit. There were no vitals filed for this visit.  LABORATORY DATA:  I have reviewed the data as listed CMP Latest Ref Rng & Units 09/23/2020 06/22/2020 03/15/2020  Glucose 70 - 99 mg/dL 121(H) 142(H) 106(H)  BUN 6 - 20 mg/dL 9 11 11   Creatinine 0.44 - 1.00 mg/dL 0.89 0.78 0.90  Sodium 135 - 145 mmol/L 141 142 141  Potassium 3.5 - 5.1 mmol/L 4.4 4.4 4.5  Chloride 98 - 111 mmol/L 103 103 103  CO2 22 - 32 mmol/L 29 30 28   Calcium 8.9 - 10.3 mg/dL 9.5 9.2 10.0  Total Protein 6.5 - 8.1 g/dL 7.5 7.1 7.4  Total Bilirubin 0.3 - 1.2 mg/dL 0.3 0.4 0.5  Alkaline Phos 38 - 126 U/L 55 53 55  AST 15 - 41 U/L 21 29 23   ALT 0 - 44 U/L 46(H) 39 36    Lab Results  Component Value Date   WBC 2.1 (L) 09/23/2020   HGB 13.2 09/23/2020   HCT 38.9 09/23/2020   MCV 104.6 (H) 09/23/2020   PLT 278 09/23/2020   NEUTROABS 1.3 (L) 09/23/2020    ASSESSMENT & PLAN:  No problem-specific Assessment & Plan notes found for this encounter.    No orders of the defined types were placed in this encounter.  The patient has a good understanding of the overall plan. she agrees with it. she will call with any problems that may develop before the next visit here.  Total time spent: *** mins including face to face time and time spent for planning, charting and coordination of care  Rulon Eisenmenger, MD, MPH 12/27/2020  I, Molly Dorshimer, am acting as scribe for Dr. [name].  {insert scribe attestation}

## 2020-12-28 ENCOUNTER — Inpatient Hospital Stay: Payer: Commercial Managed Care - PPO | Admitting: Hematology and Oncology

## 2020-12-28 ENCOUNTER — Inpatient Hospital Stay: Payer: Commercial Managed Care - PPO | Attending: Hematology and Oncology

## 2020-12-28 NOTE — Assessment & Plan Note (Deleted)
Bone scan 08/07/2019: Increase activity with sclerotic bone metastases involving left femoral neck right ninth rib and entire L5 vertebra  08/22/2019: PET CT scan: Metastatic disease in the chest with multiple lung nodules all subcentimeter size and hypermetabolic lymph nodes (right paratracheal node 7 mm, subcarinal node 1 cm, right hilar node 6 mm), retroperitoneal and upper pelvic common iliac lymph nodes, bone metastases especially L5, L4, T6, right 10th rib, left proximal femur  Bone biopsy was not felt to be reliable in addition to the effect of radiation on the bone. Palliative radiation completed 09/03/2019 [facility]:BRCA1 mutation (UPDATE: BRCA1 p.H1283R VUS has been reclassified as a likely benign variant.  The reclassification date was 04/21/2020.), TMB 5.36, MSI: Normal  Treatment plan:Ibrance withletrozoleand Xgeva for bone metastases Letrozole toxicities:Tolerating letrozole extremely well without any adverse effects  Ibrance toxicities: 1.Fatigue: She is trying to manage her life in spite of the fatigue. 2.Low white blood cell count: ANC 1 we will continue on the same dosage. 3.Hair thinning: We talked about some remedies for the hair.  CT CAP 09/23/20: Slight int enl of lingular and LLL lung nodules, Stable bone mets,   We briefly held Ibrance for dental issues (teeth extraction) Return to clinic in 3 months with labs scans and follow-up

## 2020-12-30 ENCOUNTER — Telehealth: Payer: Self-pay | Admitting: Hematology and Oncology

## 2020-12-30 NOTE — Telephone Encounter (Signed)
Pt called in to r/s appts missed on 5/24. Pt aware of updated appts date and times.

## 2021-01-20 NOTE — Progress Notes (Signed)
Patient Care Team: Patient, [name] (Inactive) as PCP - General (General Practice) [name], MD as Consulting Physician (Hematology and Oncology) [name], MD as Consulting Physician (Radiation Oncology) [name], NP as Nurse Practitioner (Hematology and Oncology) Rolm Bookbinder, MD as Consulting Physician (General Surgery)  DIAGNOSIS:    ICD-10-CM   1. Osseous metastasis (H[facility])  C79.51 CT CHEST ABDOMEN PELVIS WO CONTRAST    2. Malignant neoplasm of central portion of left breast in female, estrogen receptor positive ([place])  C50.112 CT CHEST ABDOMEN PELVIS WO CONTRAST   Z17.0     3. Metastatic breast cancer ([place])  C50.919 CT CHEST ABDOMEN PELVIS WO CONTRAST      SUMMARY OF ONCOLOGIC HISTORY: Oncology History  Cancer of central portion of left female breast ([place])  04/02/2014 Genetic Testing   BRCA1 [id number] pathogenic mutation identified on gene sequce and del/dup analyses of BRCA1 and BRCA2.  BRCA1 p.H1283R VUS identified as well.  The report date is 04/02/2014.  UPDATE: BRCA1 P.F7902I VUS has been reclassified as a likely benign variant.  The reclassification date is 04/21/2020.   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 N0 M0 stage IA: Oncotype DX 13 (ROR 9%)    04/16/2014 Surgery   Left upper back melanoma resected no residual melanoma identified on re-resection margins negative    05/26/2014 - 06/08/2015 Anti-estrogen oral therapy   Tamoxifen 20 mg daily with a plan to switch her to aromatase inhibitors once she gets oophorectomy ( patient did not continue antiestrogen therapy by choice)    08/13/2014 Surgery   oophorectomy with total abdominal hysterectomy    09/08/2015 -  Anti-estrogen oral therapy   Resumed anastrozole after she found recurrence of breast cancer;  stopped in January 2018, started letrozole 2.5 mg daily 10/12/2016; switched to Exemestane 25 mg daily on 01/03/17   09/16/2015 Surgery   Skin, left: IDC grade 3; 0.5 cm, margins negative, ER 95%, PR 10%, HER-2 positive ratio 2.08    11/08/2015 - 02/2017 Chemotherapy   Herceptin every 3 weeks plus anastrozole   11/24/2015 - 01/14/2016 Radiation Therapy   Adjuvant radiation therapy by Dr. Sondra Come    01/2017 -  Anti-estrogen oral therapy   Exemestane daily (stopped others due to side effects), due to muscle aches and pains switch to tamoxifen 5 mg daily starting 12/18/2017    08/07/2019 Relapse/Recurrence   Bone scan: Sclerotic metastatic lesions involving left femoral neck, right ninth rib and entire L5 vertebra; MRI left hip: Diffuse signal abnormality L5 vertebral body with possible extraosseous extension   08/22/2019 - 09/03/2019 Radiation Therapy   Palliative radiation with Dr. Sondra Come to L5 and left proixmal femur.      CHIEF COMPLIANT: Follow-up of metastatic breast cancer on Ibrance and letrozole  INTERVAL HISTORY: Krystal [name] is a 55 y.o. with above-mentioned history of metastatic breast cancer with osseous metastases currently on treatment with Ibrance and letrozole. She reports to the clinic today for follow-up. Denies any side effects from ibrance.  ALLERGIES:  is allergic to dilaudid [hydromorphone] and codeine.  MEDICATIONS:  Current Outpatient Medications  Medication Sig Dispense Refill   busPIRone (BUSPAR) 7.5 MG tablet Take 1 tablet (7.5 mg total) by mouth 3 (three) times daily. 30 tablet 3   calcium-vitamin D (OSCAL WITH D) 500-200 MG-UNIT tablet Take 1 tablet by mouth 2 (two)  times daily. 180 tablet 3   IBRANCE 125 MG tablet TAKE 1 TABLET DAILY FOR 21 DAYS ON, 7 DAYS OFF REPEAT EVERY 28 DAYS 21 tablet 12   letrozole (FEMARA) 2.5 MG tablet Take 1 tablet (2.5 mg total) by mouth daily. 90 tablet 3   meloxicam (MOBIC) 7.5 MG tablet Take 1 tablet (7.5 mg total) by mouth daily as  needed for pain. 30 tablet 6   ondansetron (ZOFRAN) 4 MG tablet Take 1 tablet (4 mg total) by mouth every 8 (eight) hours as needed for nausea or vomiting. 20 tablet 3   No current facility-administered medications for this visit.    PHYSICAL EXAMINATION: ECOG PERFORMANCE STATUS: 1 - Symptomatic but completely ambulatory  Vitals:   01/21/21 1144  BP: 121/68  Pulse: 72  Resp: 18  Temp: 97.8 F (36.6 C)  SpO2: 96%   Filed Weights   01/21/21 1144  Weight: 247 lb (112 kg)   LABORATORY DATA:  I have reviewed the data as listed CMP Latest Ref Rng & Units 01/21/2021 09/23/2020 06/22/2020  Glucose 70 - 99 mg/dL 137(H) 121(H) 142(H)  BUN 6 - 20 mg/dL _0 Creatinine 0.44 - 1.00 mg/dL 0.78 0.89 0.78  Sodium 135 - 145 mmol/L 139 141 142  Potassium 3.5 - 5.1 mmol/L 4.1 4.4 4.4  Chloride 98 - 111 mmol/L 103 103 103  CO2 22 - 32 mmol/L _1 Calcium 8.9 - 10.3 mg/dL 9.2 9.5 9.2  Total Protein 6.5 - 8.1 g/dL 7.3 7.5 7.1  Total Bilirubin 0.3 - 1.2 mg/dL 0.3 0.3 0.4  Alkaline Phos 38 - 126 U/L 49 55 53  AST 15 - 41 U/L 35 21 29  ALT 0 - 44 U/L 59(H) 46(H) 39    Lab Results  Component Value Date   WBC 1.8 (L) 01/21/2021   HGB 12.4 01/21/2021   HCT 35.6 (L) 01/21/2021   MCV 102.3 (H) 01/21/2021   PLT 213 01/21/2021   NEUTROABS 0.8 (L) 01/21/2021    ASSESSMENT & PLAN:  Cancer of central portion of left female breast ([place]) Bone scan 08/07/2019: Increase activity with sclerotic bone metastases involving left femoral neck right ninth rib and entire L5 vertebra   08/22/2019: PET CT scan: Metastatic disease in the chest with multiple lung nodules all subcentimeter size and hypermetabolic lymph nodes (right paratracheal node 7 mm, subcarinal node 1 cm, right hilar node 6 mm), retroperitoneal and upper pelvic common iliac lymph nodes, bone metastases especially L5, L4, T6, right 10th rib, left proximal femur   Bone biopsy was not felt to be reliable in addition to the effect of  radiation on the bone. Palliative radiation completed 09/03/2019 [facility]: BRCA1 mutation, TMB 5.36, MSI: Normal   Treatment plan: Ibrance with letrozole and Xgeva for bone metastases Letrozole toxicities: Tolerating letrozole extremely well without any adverse effects   Ibrance toxicities: Low white blood cell count: ANC 0.8 we will continue on the same dosage.    I briefly discussed the option of talazoparib for her given that she is BRCA mutation positive. This could be an option if she progresses on Ibrance.   CT CAP 09/23/20: Slight int enl of lingular and LLL lung nodules, Stable bone mets, Plan to recheck with CT in 2 months     Orders Placed This Encounter  Procedures   CT CHEST ABDOMEN PELVIS WO CONTRAST    Standing Status:   Future    Standing Expiration Date:   01/21/2022  Order Specific Question:   If indicated for the ordered procedure, I authorize the administration of contrast media per Radiology protocol    Answer:   Yes    Order Specific Question:   Is patient pregnant?    Answer:   No    Order Specific Question:   Preferred imaging location?    Answer:   [facility]    Order Specific Question:   Release to patient    Answer:   Immediate    Order Specific Question:   Is Oral Contrast requested for this exam?    Answer:   Yes, Per Radiology protocol    Order Specific Question:   Reason for Exam (SYMPTOM  OR DIAGNOSIS REQUIRED)    Answer:   Metastatic breast cancer restaging    The patient has a good understanding of the overall plan. she agrees with it. she will call with any problems that may develop before the next visit here.  Total time spent: 30 mins including face to face time and time spent for planning, charting and coordination of care  Rulon Eisenmenger, MD, MPH 01/21/2021  I, Thana Ates, am acting as scribe for Dr. [name].  I have reviewed the above documentation for accuracy and completeness, and I agree with the  above.
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
## 2021-01-21 ENCOUNTER — Inpatient Hospital Stay: Payer: Commercial Managed Care - PPO | Admitting: Hematology and Oncology

## 2021-01-21 ENCOUNTER — Telehealth: Payer: Self-pay | Admitting: Hematology and Oncology

## 2021-01-21 ENCOUNTER — Other Ambulatory Visit: Payer: Self-pay

## 2021-01-21 ENCOUNTER — Inpatient Hospital Stay: Payer: Commercial Managed Care - PPO | Attending: Hematology and Oncology

## 2021-01-21 VITALS — BP 121/68 | HR 72 | Temp 97.8°F | Resp 18 | Ht 64.0 in | Wt 247.0 lb

## 2021-01-21 DIAGNOSIS — C50112 Malignant neoplasm of central portion of left female breast: Secondary | ICD-10-CM | POA: Diagnosis not present

## 2021-01-21 DIAGNOSIS — C7951 Secondary malignant neoplasm of bone: Secondary | ICD-10-CM

## 2021-01-21 DIAGNOSIS — C50919 Malignant neoplasm of unspecified site of unspecified female breast: Secondary | ICD-10-CM | POA: Diagnosis not present

## 2021-01-21 DIAGNOSIS — Z79899 Other long term (current) drug therapy: Secondary | ICD-10-CM | POA: Diagnosis not present

## 2021-01-21 DIAGNOSIS — Z85828 Personal history of other malignant neoplasm of skin: Secondary | ICD-10-CM | POA: Insufficient documentation

## 2021-01-21 DIAGNOSIS — Z170 Estrogen receptor positive status [ER+]: Secondary | ICD-10-CM | POA: Insufficient documentation

## 2021-01-21 DIAGNOSIS — Z79811 Long term (current) use of aromatase inhibitors: Secondary | ICD-10-CM | POA: Insufficient documentation

## 2021-01-21 DIAGNOSIS — Z9221 Personal history of antineoplastic chemotherapy: Secondary | ICD-10-CM | POA: Insufficient documentation

## 2021-01-21 LAB — CBC WITH DIFFERENTIAL (CANCER CENTER ONLY)
Abs Immature Granulocytes: 0 10*3/uL (ref 0.00–0.07)
Basophils Absolute: 0.1 10*3/uL (ref 0.0–0.1)
Basophils Relative: 3 %
Eosinophils Absolute: 0.1 10*3/uL (ref 0.0–0.5)
Eosinophils Relative: 3 %
HCT: 35.6 % — ABNORMAL LOW (ref 36.0–46.0)
Hemoglobin: 12.4 g/dL (ref 12.0–15.0)
Immature Granulocytes: 0 %
Lymphocytes Relative: 38 %
Lymphs Abs: 0.7 10*3/uL (ref 0.7–4.0)
MCH: 35.6 pg — ABNORMAL HIGH (ref 26.0–34.0)
MCHC: 34.8 g/dL (ref 30.0–36.0)
MCV: 102.3 fL — ABNORMAL HIGH (ref 80.0–100.0)
Monocytes Absolute: 0.2 10*3/uL (ref 0.1–1.0)
Monocytes Relative: 12 %
Neutro Abs: 0.8 10*3/uL — ABNORMAL LOW (ref 1.7–7.7)
Neutrophils Relative %: 44 %
Platelet Count: 213 10*3/uL (ref 150–400)
RBC: 3.48 MIL/uL — ABNORMAL LOW (ref 3.87–5.11)
RDW: 14.3 % (ref 11.5–15.5)
WBC Count: 1.8 10*3/uL — ABNORMAL LOW (ref 4.0–10.5)
nRBC: 0 % (ref 0.0–0.2)

## 2021-01-21 LAB — CMP (CANCER CENTER ONLY)
ALT: 59 U/L — ABNORMAL HIGH (ref 0–44)
AST: 35 U/L (ref 15–41)
Albumin: 4.2 g/dL (ref 3.5–5.0)
Alkaline Phosphatase: 49 U/L (ref 38–126)
Anion gap: 7 (ref 5–15)
BUN: 14 mg/dL (ref 6–20)
CO2: 29 mmol/L (ref 22–32)
Calcium: 9.2 mg/dL (ref 8.9–10.3)
Chloride: 103 mmol/L (ref 98–111)
Creatinine: 0.78 mg/dL (ref 0.44–1.00)
GFR, Estimated: >60 mL/min (ref >60)
Glucose, Bld: 137 mg/dL — ABNORMAL HIGH (ref 70–99)
Potassium: 4.1 mmol/L (ref 3.5–5.1)
Sodium: 139 mmol/L (ref 135–145)
Total Bilirubin: 0.3 mg/dL (ref 0.3–1.2)
Total Protein: 7.3 g/dL (ref 6.5–8.1)

## 2021-01-21 MED ORDER — MELOXICAM 7.5 MG PO TABS: 7.5000 mg | ORAL_TABLET | Freq: Every day | ORAL | 6 refills | Status: DC | PRN

## 2021-01-21 MED ORDER — ONDANSETRON HCL 4 MG PO TABS: 4.0000 mg | ORAL_TABLET | Freq: Three times a day (TID) | ORAL | 3 refills | Status: DC | PRN

## 2021-01-21 MED ORDER — LETROZOLE 2.5 MG PO TABS: 2.5000 mg | ORAL_TABLET | Freq: Every day | ORAL | 3 refills | Status: DC

## 2021-01-21 MED ORDER — BUSPIRONE HCL 7.5 MG PO TABS: 7.5000 mg | ORAL_TABLET | Freq: Three times a day (TID) | ORAL | 3 refills | Status: DC

## 2021-01-21 NOTE — Telephone Encounter (Signed)
Scheduled per los. Declined printout  

## 2021-01-24 ENCOUNTER — Telehealth: Payer: Self-pay | Admitting: *Deleted

## 2021-01-24 ENCOUNTER — Other Ambulatory Visit: Payer: Self-pay | Admitting: Hematology and Oncology

## 2021-01-24 MED ORDER — TRAZODONE HCL 50 MG PO TABS
50.0000 mg | ORAL_TABLET | Freq: Every day | ORAL | 3 refills | Status: DC
Start: 2021-01-24 — End: 2021-07-21

## 2021-01-24 NOTE — Telephone Encounter (Signed)
Krystal [name] left a message stating she thinks she told Dr Lindi Adie the wrong name of her sleeping pill. She needs a refill of Trazadone 50 mg sent to [facility] in [place].

## 2021-02-16 ENCOUNTER — Other Ambulatory Visit: Payer: Self-pay

## 2021-02-16 ENCOUNTER — Inpatient Hospital Stay: Payer: Commercial Managed Care - PPO | Attending: Genetic Counselor | Admitting: Genetic Counselor

## 2021-02-16 ENCOUNTER — Inpatient Hospital Stay: Payer: Commercial Managed Care - PPO

## 2021-02-16 DIAGNOSIS — C50112 Malignant neoplasm of central portion of left female breast: Secondary | ICD-10-CM

## 2021-02-16 DIAGNOSIS — Z170 Estrogen receptor positive status [ER+]: Secondary | ICD-10-CM

## 2021-02-16 DIAGNOSIS — Z1509 Genetic susceptibility to other malignant neoplasm: Secondary | ICD-10-CM

## 2021-02-16 DIAGNOSIS — Z800 Family history of malignant neoplasm of digestive organs: Secondary | ICD-10-CM | POA: Diagnosis not present

## 2021-02-16 DIAGNOSIS — Z1501 Genetic susceptibility to malignant neoplasm of breast: Secondary | ICD-10-CM | POA: Diagnosis not present

## 2021-02-16 DIAGNOSIS — Z803 Family history of malignant neoplasm of breast: Secondary | ICD-10-CM

## 2021-02-17 ENCOUNTER — Encounter: Payer: Self-pay | Admitting: Genetic Counselor

## 2021-02-17 DIAGNOSIS — Z800 Family history of malignant neoplasm of digestive organs: Secondary | ICD-10-CM | POA: Insufficient documentation

## 2021-02-17 NOTE — Progress Notes (Signed)
REFERRING PROVIDER: [name], MD [place],  [place] [id number]  PRIMARY PROVIDER:  Patient, [name] (Inactive)  PRIMARY REASON FOR VISIT:  1. Malignant neoplasm of central portion of left breast in female, estrogen receptor positive ([place])   2. BRCA1 positive   3. Family history of Lynch syndrome   4. Family history of malignant neoplasm of breast   5. Family history of pancreatic cancer      HISTORY OF PRESENT ILLNESS:   Krystal [name], a 55 y.o. female, was seen for a [place] cancer genetics consultation at the request of Dr. Lindi Adie due to a personal and family history of breast cancer and a paternal cousin who was diagnosed with Lynch syndrome.  Krystal [name] presents to clinic today to discuss the possibility of a hereditary predisposition to cancer, genetic testing, and to further clarify her future cancer risks, as well as potential cancer risks for family members.   In August 2015, at the age of 49, Krystal [name] was diagnosed with Invasive ductal carcinoma of the left breast. The treatment plan double mastectomy, and chemoprevention using tamoxifen. At this time she underwent genetic testing and was found to have a BRCA1 mutation. In September 2015, at the age of 79, Krystal [name] was diagnosed with Melanoma.  This was treated with Mose surgery.  She had a TAH-BSO in 2016 at the age of 88. A paternal cousin recently underwent genetic testing and was found to have both a BRCA1 mutation and PMS2 mutation diagnosing her with Lynch syndrome.   CANCER HISTORY:  Oncology History  Cancer of central portion of left female breast ([place])  04/02/2014 Genetic Testing   BRCA1 c.68_69delAG pathogenic mutation identified on gene sequce and del/dup analyses of BRCA1 and BRCA2.  BRCA1 p.H1283R VUS identified as well.  The report date is 04/02/2014.  UPDATE: BRCA1 V.Q9450T VUS has been reclassified as a likely benign variant.  The reclassification date is 04/21/2020.   04/16/2014 Surgery    Bilateral mastectomies: Left breast : Multifocal invasive ductal carcinoma positive for lymphovascular invasion, 1.2 cm and 0.6 cm, grade 3, high-grade DCIS with comedonecrosis, 4 SLN negative T1 C. N0 M0 stage IA: Oncotype DX 13 (ROR 9%)    04/16/2014 Surgery   Left upper back melanoma resected no residual melanoma identified on re-resection margins negative    05/26/2014 - 06/08/2015 Anti-estrogen oral therapy   Tamoxifen 20 mg daily with a plan to switch her to aromatase inhibitors once she gets oophorectomy ( patient did not continue antiestrogen therapy by choice)    08/13/2014 Surgery   oophorectomy with total abdominal hysterectomy    09/08/2015 -  Anti-estrogen oral therapy   Resumed anastrozole after she found recurrence of breast cancer; stopped in January 2018, started letrozole 2.5 mg daily 10/12/2016; switched to Exemestane 25 mg daily on 01/03/17   09/16/2015 Surgery   Skin, left: IDC grade 3; 0.5 cm, margins negative, ER 95%, PR 10%, HER-2 positive ratio 2.08    11/08/2015 - 02/2017 Chemotherapy   Herceptin every 3 weeks plus anastrozole   11/24/2015 - 01/14/2016 Radiation Therapy   Adjuvant radiation therapy by Dr. Sondra Come    01/2017 -  Anti-estrogen oral therapy   Exemestane daily (stopped others due to side effects), due to muscle aches and pains switch to tamoxifen 5 mg daily starting 12/18/2017    08/07/2019 Relapse/Recurrence   Bone scan: Sclerotic metastatic lesions involving left femoral neck, right ninth rib and entire L5 vertebra; MRI left hip: Diffuse  signal abnormality L5 vertebral body with possible extraosseous extension   08/22/2019 - 09/03/2019 Radiation Therapy   Palliative radiation with Dr. Sondra Come to L5 and left proixmal femur.       RISK FACTORS:  Menarche was at age 22.  First live birth at age 56.  OCP use for approximately 9 years.  Ovaries intact: no.  Hysterectomy: yes.  Menopausal status: postmenopausal.  HRT use: 0 years. Colonoscopy: no;  not examined. Mammogram within the last year: n/a. Number of breast biopsies: 1. Up to date with pelvic exams: yes. Any excessive radiation exposure in the past: yes  Past Medical History:  Diagnosis Date   Arthritis    shoulders   BRCA1 positive    BRCA1 [id number].[id number]   Cancer ([place])    Chest wall mass 89/3810   Complication of anesthesia    respiratory depression/arrest with Dilaudid   Cough with sputum 09/10/2015   clear sputum, per pt.   Dental crowns present    Diabetes mellitus without complication ([place])    Family history of Lynch syndrome    Family history of pancreatic cancer    GERD (gastroesophageal reflux disease)    takes OTC meds   History of breast cancer 2015   Stuffy and runny nose 09/10/2015   green drainage from nose, per pt.    Past Surgical History:  Procedure Laterality Date   ABDOMINAL HYSTERECTOMY     BREAST LUMPECTOMY Left 09/16/2015   Procedure: LEFT BREAST MASS EXCISION;  Surgeon: Rolm Bookbinder, MD;  Location: [facility];  Service: General;  Laterality: Left;   BREAST RECONSTRUCTION WITH PLACEMENT OF TISSUE EXPANDER AND FLEX HD (ACELLULAR HYDRATED DERMIS) Bilateral 04/16/2014   Procedure: BILATERAL BREAST RECONSTRUCTION WITH PLACEMENT OF TISSUE EXPANDER AND FLEX HD (ACELLULAR HYDRATED DERMIS);  Surgeon: Irene Limbo, MD;  Location: [facility] [place] [facility];  Service: Plastics;  Laterality: Bilateral;   CESAREAN SECTION  1999; 2001; 2005   [place] AND CLOSURE WOUND Bilateral 05/15/2014   Procedure: DEBRIDEMENT AND CLOSURE OF BILATERAL MASECTOMY INCISIONS WITH BREAST EXPANSION;  Surgeon: Irene Limbo, MD;  Location: [facility];  Service: Plastics;  Laterality: Bilateral;   LAPAROSCOPIC ASSISTED VAGINAL HYSTERECTOMY N/A 08/13/2014   Procedure: OPEN LAPAROSCOPY, EXPLORATORY LAPAROTOMY, TOTAL ABDOMINAL HYSTERECTOMY WITH BILATERAL SALPINGECTOMY AND BILATERAL OOPHORECTOMY;   Surgeon: Darlyn Chamber, MD;  Location: [facility];  Service: Gynecology;  Laterality: N/A;   LIPOSUCTION WITH LIPOFILLING Bilateral 11/13/2014   Procedure: LIPOFILLING TO BILATERAL CHEST ;  Surgeon: Irene Limbo, MD;  Location: [facility];  Service: Plastics;  Laterality: Bilateral;   MELANOMA EXCISION N/A 04/16/2014   Procedure: WIDE LOCAL EXCISION OF BACK MELANOMA;  Surgeon: Rolm Bookbinder, MD;  Location: [facility];  Service: General;  Laterality: N/A;   PORT-A-CATH REMOVAL Right 06/14/2017   Procedure: REMOVAL PORT-A-CATH;  Surgeon: Rolm Bookbinder, MD;  Location: [facility];  Service: General;  Laterality: Right;   PORTACATH PLACEMENT Right 10/21/2015   Procedure: INSERTION PORT-A-CATH WITH ULTRASOUND ;  Surgeon: Rolm Bookbinder, MD;  Location: [facility];  Service: General;  Laterality: Right;   REMOVAL OF BILATERAL TISSUE EXPANDERS WITH PLACEMENT OF BILATERAL BREAST IMPLANTS Bilateral 11/13/2014   Procedure: REMOVAL OF BILATERAL TISSUE EXPANDERS WITH PLACEMENT OF BILATERAL SILICONE IMPLANTS ;  Surgeon: Irene Limbo, MD;  Location: [facility];  Service: Plastics;  Laterality: Bilateral;   SIMPLE MASTECTOMY WITH AXILLARY SENTINEL NODE  BIOPSY Bilateral 04/16/2014   Procedure: BILATERAL SKIN SPARING  MASTECTOMIES WITH LEFT AXILLARY SENTINEL NODE BIOPSY;  Surgeon: Rolm Bookbinder, MD;  Location: [facility];  Service: General;  Laterality: Bilateral;    Social History   Socioeconomic History   Marital status: Widowed    Spouse name: Not on file   Number of children: 3   Years of education: Not on file   Highest education level: Not on file  Occupational History   Occupation: Facilities manager: [employer]  Tobacco Use   Smoking status: Some Days    Packs/day: 0.00    Years: 0.00    Pack years: 0.00    Types: Cigarettes    Last  attempt to quit: 04/17/2014    Years since quitting: 6.8   Smokeless tobacco: Never   Tobacco comments:    smokes 1-2 cigs every week  Substance and Sexual Activity   Alcohol use: Yes    Comment: occasionally   Drug use: No   Sexual activity: Not on file  Other Topics Concern   Not on file  Social History Narrative   Not on file   Social Determinants of Health   Financial Resource Strain: Not on file  Food Insecurity: Not on file  Transportation Needs: Not on file  Physical Activity: Not on file  Stress: Not on file  Social Connections: Not on file     FAMILY HISTORY:  We obtained a detailed, 4-generation family history.  Significant diagnoses are listed below: Family History  Problem Relation Age of Onset   Other Father        COVID complications   Breast cancer Paternal Aunt        dx > 21   Pancreatic cancer Paternal Uncle 3       smoker; deceased   Heart disease Maternal Grandmother    Heart attack Maternal Grandfather    Breast cancer Paternal Grandmother        dx 63s; ? if had 2nd BC in 69s; deceased 6s   Other Cousin        Lynch syndrome due to PMS2   BRCA 1/2 Cousin     The patient has two sons and a daughter who are cancer free.  She has two brothers who are cancer free.  Her father is deceased and her mother is living.  The patient's father died of Covid complications at age 9.  He had a brother and sister.  His sister developed breast cancer over age 34 and his brother developed bile duct cancer and died.  His daughter underwent genetic testing and was found to have a BRCA1 mutation and PMS2 Lynch syndrome mutation.  The paternal grandparents are deceased.  The grandmother had breast cancer in her 68's.  The patient's mother is living in her 13's.  She had two brothers and a sister who are cancer free.  The maternal grandparents are deceased and did not have cancer.  Krystal [name] is aware of previous family history of genetic testing for hereditary cancer  risks. Patient's maternal ancestors are of [place] descent, and paternal ancestors are of English descent. There is possible reported Ashkenazi Jewish ancestry. There is no known consanguinity.  GENETIC COUNSELING ASSESSMENT: Krystal [name] is a 55 y.o. female with a personal and family history of cancer and known pathogenic mutations in both BRCA1 and PMS2 which is suggestive of a hereditary syndrome such as Lynch syndrome and hereditary breast and ovarian cancer  syndrome. and predisposition to cancer. We, therefore, discussed and recommended the following at today's visit.   DISCUSSION: We discussed that her cousin was diagnosed with Lynch syndrome based on genetic testing through [facility].  She was found to have a PMS2 c.2531C>A pathogenic mutation.  By report, the cousin's mother has melanoma on her side of the family but that she is not aware that it is consistent with Lynch syndrome.  Based on the cousin's diagnosis of Lynch syndrome, Krystal [name] has a 12.5% chance of also testing positive for this mutation.  We discussed that there is a chance that she did not inherit this mutation, but that her brothers need to undergo genetic testing for both the BRCA1 and Lynch syndrome mutations to ensure that they are followed appropriately and that they can protect their children as well.  Additionally, the patient was diagnosed with melanoma.  We can add on the melanoma genes, in addition to Lynch syndrome and other genes.  We reviewed the characteristics, features and inheritance patterns of hereditary cancer syndromes. We also discussed genetic testing, including the appropriate family members to test, the process of testing, insurance coverage and turn-around-time for results. We discussed the implications of a negative, positive, carrier and/or variant of uncertain significant result. We recommended Krystal [name] pursue genetic testing for the CancerNext-Expanded+RNAinsight gene panel. The CancerNext-Expanded  gene panel offered by [facility] and includes sequencing and rearrangement analysis for the following 77 genes: AIP, ALK, APC*, ATM*, AXIN2, BAP1, BARD1, BLM, BMPR1A, BRCA1*, BRCA2*, BRIP1*, CDC73, CDH1*, CDK4, CDKN1B, CDKN2A, CHEK2*, CTNNA1, DICER1, FANCC, FH, FLCN, GALNT12, KIF1B, LZTR1, MAX, MEN1, MET, MLH1*, MSH2*, MSH3, MSH6*, MUTYH*, NBN, NF1*, NF2, NTHL1, PALB2*, PHOX2B, PMS2*, POT1, PRKAR1A, PTCH1, PTEN*, RAD51C*, RAD51D*, RB1, RECQL, RET, SDHA, SDHAF2, SDHB, SDHC, SDHD, SMAD4, SMARCA4, SMARCB1, SMARCE1, STK11, SUFU, TMEM127, TP53*, TSC1, TSC2, VHL and XRCC2 (sequencing and deletion/duplication); EGFR, EGLN1, HOXB13, KIT, MITF, PDGFRA, POLD1, and POLE (sequencing only); EPCAM and GREM1 (deletion/duplication only). DNA and RNA analyses performed for * genes.   Based on Krystal [name] personal and family history of cancer, she meets medical criteria for genetic testing. Despite that she meets criteria, she may still have an out of pocket cost. We discussed that if her out of pocket cost for testing is over $100, the laboratory will call and confirm whether she wants to proceed with testing.  If the out of pocket cost of testing is less than $100 she will be billed by the genetic testing laboratory.   PLAN: After considering the risks, benefits, and limitations, Krystal [name] provided informed consent to pursue genetic testing and the blood sample was sent to [facility] for analysis of the CancerNext-Expanded+RNAinsight. Results should be available within approximately 2-3 weeks' time, at which point they will be disclosed by telephone to Krystal [name], as will any additional recommendations warranted by these results. Krystal [name] will receive a summary of her genetic counseling visit and a copy of her results once available. This information will also be available in Epic.   Lastly, we encouraged Krystal [name] to remain in contact with cancer genetics annually so that we can continuously update the family  history and inform her of any changes in cancer genetics and testing that may be of benefit for this family.   Krystal [name] questions were answered to her satisfaction today. Our contact information was provided should additional questions or concerns arise. Thank you for the referral and allowing [place] to share in the care of your patient.  Maira Christon P. Florene Glen, [place], [facility] Licensed, Insurance risk surveyor [email or web address] phone: [phone]  The patient was seen for a total of 40 minutes in face-to-face genetic counseling.  The patient was seen alone.  This patient was discussed with Drs. Magrinat, Lindi Adie and/or Burr Medico who agrees with the above.    _______________________________________________________________________ For Office Staff:  Number of people involved in session: 1 Was an Intern/ student involved with case: yes

## 2021-03-07 ENCOUNTER — Ambulatory Visit: Payer: Self-pay | Admitting: Genetic Counselor

## 2021-03-07 ENCOUNTER — Telehealth: Payer: Self-pay | Admitting: Genetic Counselor

## 2021-03-07 DIAGNOSIS — Z170 Estrogen receptor positive status [ER+]: Secondary | ICD-10-CM

## 2021-03-07 DIAGNOSIS — Z1379 Encounter for other screening for genetic and chromosomal anomalies: Secondary | ICD-10-CM

## 2021-03-07 DIAGNOSIS — Z1501 Genetic susceptibility to malignant neoplasm of breast: Secondary | ICD-10-CM

## 2021-03-07 NOTE — Progress Notes (Signed)
GENETIC TEST RESULTS   Patient Name: Krystal [name] Patient Age: 55 y.o. Encounter Date: 03/07/2021  Referring Provider: [name], MD    Krystal [name] was seen in the [facility] clinic on February 16, 2021 due to a personal and family history of cancer and concern regarding a hereditary predisposition to cancer in the family. Please refer to the prior Genetics clinic note for more information regarding Krystal [name]'s medical and family histories and our assessment at the time.   FAMILY HISTORY:  We obtained a detailed, 4-generation family history.  Significant diagnoses are listed below: Family History  Problem Relation Age of Onset   Other Father        COVID complications   Breast cancer Paternal Aunt        dx > 61   Pancreatic cancer Paternal Uncle 58       smoker; deceased   Heart disease Maternal Grandmother    Heart attack Maternal Grandfather    Breast cancer Paternal Grandmother        dx 32s; ? if had 2nd BC in 73s; deceased 28s   Other Cousin        Lynch syndrome due to PMS2   BRCA 1/2 Cousin     The patient has two sons and a daughter who are cancer free.  She has two brothers who are cancer free.  Her father is deceased and her mother is living.   The patient's father died of Covid complications at age 61.  He had a brother and sister.  His sister developed breast cancer over age 39 and his brother developed bile duct cancer and died.  His daughter underwent genetic testing and was found to have a BRCA1 mutation and PMS2 Lynch syndrome mutation.  The paternal grandparents are deceased.  The grandmother had breast cancer in her 79's.   The patient's mother is living in her 33's.  She had two brothers and a sister who are cancer free.  The maternal grandparents are deceased and did not have cancer.   Krystal [name] is aware of previous family history of genetic testing for hereditary cancer risks. Patient's maternal ancestors are of [place] descent, and paternal ancestors are of  English descent. There is possible reported Ashkenazi Jewish ancestry. There is no known consanguinity.  GENETIC TESTING:  At the time of Krystal [name]'s visit, we recommended she pursue genetic testing of the CancerNext-Expanded+RNA genetic test. The genetic testing reported out on March 04, 2021 through the CancerNext-Expanded+RNAinsight Cancer Panel offered by [facility] which identified a single, heterozygous pathogenic gene mutation called BRCA1, c.68_69delAG. The PMS2 c.2531C>A pathogenic mutation identified in Krystal [name]'s cousin was not identified.  There were no deleterious mutations in AIP, ALK, APC*, ATM*, AXIN2, BAP1, BARD1, BLM, BMPR1A, BRCA2*, BRIP1*, CDC73, CDH1*, CDK4, CDKN1B, CDKN2A, CHEK2*, CTNNA1, DICER1, FANCC, FH, FLCN, GALNT12, KIF1B, LZTR1, MAX, MEN1, MET, MLH1*, MSH2*, MSH3, MSH6*, MUTYH*, NBN, NF1*, NF2, NTHL1, PALB2*, PHOX2B, PMS2*, POT1, PRKAR1A, PTCH1, PTEN*, RAD51C*, RAD51D*, RB1, RECQL, RET, SDHA, SDHAF2, SDHB, SDHC, SDHD, SMAD4, SMARCA4, SMARCB1, SMARCE1, STK11, SUFU, TMEM127, TP53*, TSC1, TSC2, VHL and XRCC2 (sequencing and deletion/duplication); EGFR, EGLN1, HOXB13, KIT, MITF, PDGFRA, POLD1, and POLE (sequencing only); EPCAM and GREM1 (deletion/duplication only). DNA and RNA analyses performed for * genes. Marland Kitchen     CLINICAL CONDITION: Krystal [name] had been tested in the past for BRCA1 and did not have any further questions.  She stated she will have her children undergo genetic testing for the BRCA1 pathogenic mutation.  FAMILY MEMBERS: Despite not having the PMS2 pathogenic mutation, it is important that all of Krystal [name]'s relatives (both men and women) know of the presence of this gene mutation. Her father, who is deceased, could have had this mutation and Krystal [name] just did not inherit it.  It is also important for them to know about her BRCA1 pathogenic mutation.  Site-specific genetic testing can sort out who in the family is at risk and who is not. It is advantageous to know if a  BRCA1 or PMS2 pathogenic variant is present as medical management recommendations can be implemented. At-risk relatives can be identified, allowing pursuit of a diagnostic evaluation. In addition, the available information regarding hereditary cancer susceptibility genes is constantly evolving and more clinically relevant BRCA1 and PMS2 data is likely to become available in the near future. Awareness of this cancer predisposition allows patients and their providers to be vigilant in maintaining close and regular contact with their local genetics clinic in anticipation of new information, inform at-risk family members, and diligently follow condition-specific screening protocols.  We encouraged Krystal [name] to remain in contact with [place] on an annual basis so we can update her personal and family histories, and let her know of advances in cancer genetics that may benefit the family. Our contact number was provided. Krystal [name] questions were answered to her satisfaction today, and she knows she is welcome to call anytime with additional questions.   Clair Bardwell P. Florene Glen, [place], [facility] Licensed, Insurance risk surveyor [email or web address] phone: [phone]

## 2021-03-07 NOTE — Telephone Encounter (Signed)
Revealed that testing only identified the BRCA1 pathogenic variant that we already knew about.  She does not have the Lynch syndrome mutation found in her cousin.

## 2021-03-14 ENCOUNTER — Other Ambulatory Visit (HOSPITAL_COMMUNITY): Payer: Self-pay

## 2021-03-14 ENCOUNTER — Telehealth: Payer: Self-pay | Admitting: *Deleted

## 2021-03-14 ENCOUNTER — Other Ambulatory Visit: Payer: Self-pay | Admitting: Hematology and Oncology

## 2021-03-14 MED ORDER — MOLNUPIRAVIR EUA 200MG CAPSULE
4.0000 | ORAL_CAPSULE | Freq: Two times a day (BID) | ORAL | 0 refills | Status: AC
  Filled 2021-03-14: qty 40, 5d supply, fill #0

## 2021-03-14 NOTE — Telephone Encounter (Signed)
Pt states she tested positive for Covid on Sunday. 104.3 last night, took excedrin and tylenol. Today 99.4, backache, headache and slight cough. Spoke with after hours nurse last night.   Wants to know if she needs monoclonal antibodies or Paxlovid. Wonders what to do about Ibrance. Has been off, due to restart Thursday.

## 2021-03-14 NOTE — Progress Notes (Signed)
COVID-19 infection with moderate symptoms: Sent a prescription for [employer]

## 2021-03-14 NOTE — Telephone Encounter (Signed)
Notified to hold Ibrance for one more week. He is going to send a prescription. Verbalized understanding.

## 2021-03-19 ENCOUNTER — Other Ambulatory Visit (HOSPITAL_COMMUNITY): Payer: Self-pay

## 2021-03-21 ENCOUNTER — Telehealth: Payer: Self-pay | Admitting: *Deleted

## 2021-03-21 MED ORDER — LEVOFLOXACIN 750 MG PO TABS
750.0000 mg | ORAL_TABLET | Freq: Every day | ORAL | 0 refills | Status: DC
Start: 2021-03-21 — End: 2021-04-06

## 2021-03-21 NOTE — Telephone Encounter (Signed)
Krystal [name] states she had covid and did not take Molnupiravir. Was concerned about taking medication. Has had stuffy nose, no fever. Is requesting antibiotics for current symptoms. Levofloxacin 750 mg daily for 7 days. Dr Lindi Adie instructed to hold Ibrance X 1 week until symptoms improve.

## 2021-04-01 ENCOUNTER — Ambulatory Visit: Payer: Commercial Managed Care - PPO | Admitting: Hematology and Oncology

## 2021-04-04 ENCOUNTER — Ambulatory Visit (HOSPITAL_COMMUNITY)
Admission: RE | Admit: 2021-04-04 | Discharge: 2021-04-04 | Disposition: A | Discharge status: Home / Self Care | Payer: Commercial Managed Care - PPO | Source: Ambulatory Visit | Attending: Hematology and Oncology | Admitting: Hematology and Oncology

## 2021-04-04 ENCOUNTER — Other Ambulatory Visit: Payer: Self-pay

## 2021-04-04 DIAGNOSIS — C50112 Malignant neoplasm of central portion of left female breast: Secondary | ICD-10-CM | POA: Diagnosis present

## 2021-04-04 DIAGNOSIS — C50919 Malignant neoplasm of unspecified site of unspecified female breast: Secondary | ICD-10-CM | POA: Diagnosis present

## 2021-04-04 DIAGNOSIS — C7951 Secondary malignant neoplasm of bone: Secondary | ICD-10-CM

## 2021-04-04 DIAGNOSIS — Z170 Estrogen receptor positive status [ER+]: Secondary | ICD-10-CM | POA: Diagnosis present

## 2021-04-05 NOTE — Progress Notes (Signed)
Patient Care Team: Patient, [name] (Inactive) as PCP - General (General Practice) [name], MD as Consulting Physician (Hematology and Oncology) [name], MD as Consulting Physician (Radiation Oncology) Delice Bison, Charlestine Massed, NP as Nurse Practitioner (Hematology and Oncology) Rolm Bookbinder, MD as Consulting Physician (General Surgery)  DIAGNOSIS:    ICD-10-CM   1. Malignant neoplasm of central portion of left breast in female, estrogen receptor positive ([place])  C50.112    Z17.0       SUMMARY OF ONCOLOGIC HISTORY: Oncology History  Cancer of central portion of left female breast ([place])  04/02/2014 Genetic Testing   BRCA1 [id number] pathogenic mutation identified on gene sequce and del/dup analyses of BRCA1 and BRCA2.  BRCA1 p.H1283R VUS identified as well.  The report date is 04/02/2014.  UPDATE: BRCA1 F.[id number] VUS has been reclassified as a likely benign variant.  The reclassification date is 04/21/2020.   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 N0 M0 stage IA: Oncotype DX 13 (ROR 9%)   04/16/2014 Surgery   Left upper back melanoma resected no residual melanoma identified on re-resection margins negative   05/26/2014 - 06/08/2015 Anti-estrogen oral therapy   Tamoxifen 20 mg daily with a plan to switch her to aromatase inhibitors once she gets oophorectomy ( patient did not continue antiestrogen therapy by choice)   08/13/2014 Surgery   oophorectomy with total abdominal hysterectomy   09/08/2015 -  Anti-estrogen oral therapy   Resumed anastrozole after she found recurrence of breast cancer; stopped in January 2018, started letrozole 2.5 mg daily 10/12/2016; switched to Exemestane 25 mg daily on 01/03/17   09/16/2015 Surgery   Skin, left: IDC grade 3; 0.5 cm, margins negative, ER 95%, PR 10%,  HER-2 positive ratio 2.08   11/08/2015 - 02/2017 Chemotherapy   Herceptin every 3 weeks plus anastrozole   11/24/2015 - 01/14/2016 Radiation Therapy   Adjuvant radiation therapy by Dr. Sondra Come   01/2017 -  Anti-estrogen oral therapy   Exemestane daily (stopped others due to side effects), due to muscle aches and pains switch to tamoxifen 5 mg daily starting 12/18/2017   08/07/2019 Relapse/Recurrence   Bone scan: Sclerotic metastatic lesions involving left femoral neck, right ninth rib and entire L5 vertebra; MRI left hip: Diffuse signal abnormality L5 vertebral body with possible extraosseous extension   08/22/2019 - 09/03/2019 Radiation Therapy   Palliative radiation with Dr. Sondra Come to L5 and left proixmal femur.      CHIEF COMPLIANT: Follow-up of metastatic breast cancer on Ibrance and letrozole  INTERVAL HISTORY: Krystal [name] is a 55 y.o. with above-mentioned history of metastatic breast cancer with osseous metastases currently on treatment with Ibrance and letrozole. CT CAP on 04/04/2021 showed interval increased size of lingular and left lower lobe pulmonary nodules concerning for metastatic disease. She reports to the clinic today for follow-up.  Her major complaint today is fatigue.  She is otherwise been feeling quite well.  She is tolerating Ibrance reasonably well.  She had multiple dental procedures and therefore she was off Ibrance for sometime.  She was also diagnosed with COVID-19 and had to stop Ibrance for another short period of time.  ALLERGIES:  is allergic to dilaudid [hydromorphone] and codeine.  MEDICATIONS:  Current Outpatient Medications  Medication Sig Dispense Refill   calcium-vitamin D (OSCAL WITH D) 500-200 MG-UNIT tablet Take 1 tablet by  mouth 2 (two) times daily. 180 tablet 3   IBRANCE 125 MG tablet TAKE 1 TABLET DAILY FOR 21 DAYS ON, 7 DAYS OFF REPEAT EVERY 28 DAYS 21 tablet 12   letrozole (FEMARA) 2.5 MG tablet Take 1 tablet (2.5 mg total) by mouth daily. 90 tablet  3   levofloxacin (LEVAQUIN) 750 MG tablet Take 1 tablet (750 mg total) by mouth daily. 7 tablet 0   meloxicam (MOBIC) 7.5 MG tablet Take 1 tablet (7.5 mg total) by mouth daily as needed for pain. 30 tablet 6   ondansetron (ZOFRAN) 4 MG tablet Take 1 tablet (4 mg total) by mouth every 8 (eight) hours as needed for nausea or vomiting. 20 tablet 3   traZODone (DESYREL) 50 MG tablet Take 1 tablet (50 mg total) by mouth at bedtime. 30 tablet 3   No current facility-administered medications for this visit.    PHYSICAL EXAMINATION: ECOG PERFORMANCE STATUS: 1 - Symptomatic but completely ambulatory  Vitals:   04/06/21 0809  BP: 127/74  Pulse: 88  Resp: 18  Temp: 98.1 F (36.7 C)  SpO2: 97%   Filed Weights   04/06/21 0809  Weight: 247 lb 6.4 oz (112.2 kg)      LABORATORY DATA:  I have reviewed the data as listed CMP Latest Ref Rng & Units 01/21/2021 09/23/2020 06/22/2020  Glucose 70 - 99 mg/dL 137(H) 121(H) 142(H)  BUN 6 - 20 mg/dL _0 Creatinine 0.44 - 1.00 mg/dL 0.78 0.89 0.78  Sodium 135 - 145 mmol/L 139 141 142  Potassium 3.5 - 5.1 mmol/L 4.1 4.4 4.4  Chloride 98 - 111 mmol/L 103 103 103  CO2 22 - 32 mmol/L _1 Calcium 8.9 - 10.3 mg/dL 9.2 9.5 9.2  Total Protein 6.5 - 8.1 g/dL 7.3 7.5 7.1  Total Bilirubin 0.3 - 1.2 mg/dL 0.3 0.3 0.4  Alkaline Phos 38 - 126 U/L 49 55 53  AST 15 - 41 U/L 35 21 29  ALT 0 - 44 U/L 59(H) 46(H) 39    Lab Results  Component Value Date   WBC 1.8 (L) 01/21/2021   HGB 12.4 01/21/2021   HCT 35.6 (L) 01/21/2021   MCV 102.3 (H) 01/21/2021   PLT 213 01/21/2021   NEUTROABS 0.8 (L) 01/21/2021    ASSESSMENT & PLAN:  Cancer of central portion of left female breast ([place]) Bone scan 08/07/2019: Increase activity with sclerotic bone metastases involving left femoral neck right ninth rib and entire L5 vertebra   08/22/2019: PET CT scan: Metastatic disease in the chest with multiple lung nodules all subcentimeter size and hypermetabolic lymph  nodes (right paratracheal node 7 mm, subcarinal node 1 cm, right hilar node 6 mm), retroperitoneal and upper pelvic common iliac lymph nodes, bone metastases especially L5, L4, T6, right 10th rib, left proximal femur   Bone biopsy was not felt to be reliable in addition to the effect of radiation on the bone. Palliative radiation completed 09/03/2019 [facility]: BRCA1 mutation, TMB 5.36, MSI: Normal   Treatment plan: Ibrance with letrozole and Xgeva for bone metastases Letrozole toxicities: Tolerating letrozole extremely well without any adverse effects   Bone metastasis: Patient does not want to take bisphosphonates.  Encouraged her to exercise and take calcium in diet and supplements.  Ibrance toxicities: Low white blood cell count: Currently on 125 mg of Ibrance.   CT CAP 09/23/20: Slight int enl of lingular and LLL lung nodules, Stable bone mets CT CAP 04/04/2021: Increase in size of  lymph nodes subcarinal lymph node 1.2 cm (previously 6 mm) right lower paratracheal lymph node 6 mm, interval enlargement of lingular 9 mm (previously 5 mm) and left lower lobe lung nodules 6 mm previously 4 mm, stable bone metastases right lateral rib, L5, S1  Plan: Discussed option of talazoparib versus continuing Ibrance. The major side effects of the talazoparib include fatigue, headache, increased blood sugars, decreased calcium, nausea vomiting diarrhea, hematologic toxicities like anemia neutropenia and thrombocytopenia as well as elevated LFTs. We decided to keep her on Ibrance at this current time. Recheck labs today and in 3 months we will follow her up.  Our plan is to obtain another scan in 6 months.   No orders of the defined types were placed in this encounter.  The patient has a good understanding of the overall plan. she agrees with it. she will call with any problems that may develop before the next visit here.  Total time spent: 30 mins including face to face time and time spent for  planning, charting and coordination of care  Rulon Eisenmenger, MD, MPH 04/06/2021  I, Thana Ates, am acting as scribe for Dr. [name].  I have reviewed the above documentation for accuracy and completeness, and I agree with the above.

## 2021-04-06 ENCOUNTER — Inpatient Hospital Stay: Payer: Commercial Managed Care - PPO

## 2021-04-06 ENCOUNTER — Other Ambulatory Visit: Payer: Self-pay

## 2021-04-06 ENCOUNTER — Inpatient Hospital Stay: Payer: Commercial Managed Care - PPO | Attending: Hematology and Oncology | Admitting: Hematology and Oncology

## 2021-04-06 DIAGNOSIS — Z9013 Acquired absence of bilateral breasts and nipples: Secondary | ICD-10-CM | POA: Diagnosis not present

## 2021-04-06 DIAGNOSIS — Z85820 Personal history of malignant melanoma of skin: Secondary | ICD-10-CM | POA: Insufficient documentation

## 2021-04-06 DIAGNOSIS — C7951 Secondary malignant neoplasm of bone: Secondary | ICD-10-CM | POA: Diagnosis not present

## 2021-04-06 DIAGNOSIS — Z170 Estrogen receptor positive status [ER+]: Secondary | ICD-10-CM | POA: Diagnosis not present

## 2021-04-06 DIAGNOSIS — Z8616 Personal history of COVID-19: Secondary | ICD-10-CM | POA: Diagnosis not present

## 2021-04-06 DIAGNOSIS — Z923 Personal history of irradiation: Secondary | ICD-10-CM | POA: Diagnosis not present

## 2021-04-06 DIAGNOSIS — C50112 Malignant neoplasm of central portion of left female breast: Secondary | ICD-10-CM

## 2021-04-06 DIAGNOSIS — Z79811 Long term (current) use of aromatase inhibitors: Secondary | ICD-10-CM | POA: Diagnosis not present

## 2021-04-06 DIAGNOSIS — D72819 Decreased white blood cell count, unspecified: Secondary | ICD-10-CM | POA: Diagnosis not present

## 2021-04-06 DIAGNOSIS — Z9221 Personal history of antineoplastic chemotherapy: Secondary | ICD-10-CM | POA: Diagnosis not present

## 2021-04-06 DIAGNOSIS — R918 Other nonspecific abnormal finding of lung field: Secondary | ICD-10-CM | POA: Diagnosis not present

## 2021-04-06 DIAGNOSIS — Z1509 Genetic susceptibility to other malignant neoplasm: Secondary | ICD-10-CM | POA: Insufficient documentation

## 2021-04-06 LAB — CBC WITH DIFFERENTIAL (CANCER CENTER ONLY)
Abs Immature Granulocytes: 0.01 10*3/uL (ref 0.00–0.07)
Basophils Absolute: 0 10*3/uL (ref 0.0–0.1)
Basophils Relative: 1 %
Eosinophils Absolute: 0.4 10*3/uL (ref 0.0–0.5)
Eosinophils Relative: 11 %
HCT: 36.8 % (ref 36.0–46.0)
Hemoglobin: 12.9 g/dL (ref 12.0–15.0)
Immature Granulocytes: 0 %
Lymphocytes Relative: 16 %
Lymphs Abs: 0.6 10*3/uL — ABNORMAL LOW (ref 0.7–4.0)
MCH: 35.6 pg — ABNORMAL HIGH (ref 26.0–34.0)
MCHC: 35.1 g/dL (ref 30.0–36.0)
MCV: 101.7 fL — ABNORMAL HIGH (ref 80.0–100.0)
Monocytes Absolute: 0.1 10*3/uL (ref 0.1–1.0)
Monocytes Relative: 4 %
Neutro Abs: 2.6 10*3/uL (ref 1.7–7.7)
Neutrophils Relative %: 68 %
Platelet Count: 245 10*3/uL (ref 150–400)
RBC: 3.62 MIL/uL — ABNORMAL LOW (ref 3.87–5.11)
RDW: 12.8 % (ref 11.5–15.5)
WBC Count: 3.7 10*3/uL — ABNORMAL LOW (ref 4.0–10.5)
nRBC: 0 % (ref 0.0–0.2)

## 2021-04-06 LAB — CMP (CANCER CENTER ONLY)
ALT: 77 U/L — ABNORMAL HIGH (ref 0–44)
AST: 46 U/L — ABNORMAL HIGH (ref 15–41)
Albumin: 4.1 g/dL (ref 3.5–5.0)
Alkaline Phosphatase: 60 U/L (ref 38–126)
Anion gap: 11 (ref 5–15)
BUN: 11 mg/dL (ref 6–20)
CO2: 28 mmol/L (ref 22–32)
Calcium: 9.6 mg/dL (ref 8.9–10.3)
Chloride: 104 mmol/L (ref 98–111)
Creatinine: 0.79 mg/dL (ref 0.44–1.00)
GFR, Estimated: >60 mL/min (ref >60)
Glucose, Bld: 130 mg/dL — ABNORMAL HIGH (ref 70–99)
Potassium: 4.4 mmol/L (ref 3.5–5.1)
Sodium: 143 mmol/L (ref 135–145)
Total Bilirubin: 0.8 mg/dL (ref 0.3–1.2)
Total Protein: 7.4 g/dL (ref 6.5–8.1)

## 2021-04-06 NOTE — Assessment & Plan Note (Signed)
Bone scan 08/07/2019: Increase activity with sclerotic bone metastases involving left femoral neck right ninth rib and entire L5 vertebra  08/22/2019: PET CT scan: Metastatic disease in the chest with multiple lung nodules all subcentimeter size and hypermetabolic lymph nodes (right paratracheal node 7 mm, subcarinal node 1 cm, right hilar node 6 mm), retroperitoneal and upper pelvic common iliac lymph nodes, bone metastases especially L5, L4, T6, right 10th rib, left proximal femur  Bone biopsy was not felt to be reliable in addition to the effect of radiation on the bone. Palliative radiation completed 09/03/2019 [facility]:BRCA1 mutation, TMB 5.36, MSI: Normal  Treatment plan:Ibrance withletrozoleand Xgeva for bone metastases Letrozole toxicities:Tolerating letrozole extremely well without any adverse effects  Ibrance toxicities: Low white blood cell count: ANC 0.8 we will continue on the same dosage.  CT CAP 09/23/20: Slight int enl of lingular and LLL lung nodules, Stable bone mets CT CAP 04/04/2021: Increase in size of lymph nodes subcarinal lymph node 1.2 cm (previously 6 mm) right lower paratracheal lymph node 6 mm, interval enlargement of lingular 9 mm (previously 5 mm) and left lower lobe lung nodules 6 mm previously 4 mm, stable bone metastases right lateral rib, L5, S1  Plan: Discussed option of talazoparib versus continuing Ibrance. The major side effects of the talazoparib include fatigue, headache, increased blood sugars, decreased calcium, nausea vomiting diarrhea, hematologic toxicities like anemia neutropenia and thrombocytopenia as well as elevated LFTs.

## 2021-04-08 ENCOUNTER — Ambulatory Visit: Payer: Commercial Managed Care - PPO | Admitting: Hematology and Oncology

## 2021-04-15 ENCOUNTER — Ambulatory Visit: Payer: Commercial Managed Care - PPO | Admitting: Hematology and Oncology

## 2021-04-22 ENCOUNTER — Other Ambulatory Visit (HOSPITAL_COMMUNITY): Payer: Self-pay

## 2021-06-27 ENCOUNTER — Telehealth: Payer: Self-pay | Admitting: *Deleted

## 2021-06-27 NOTE — Telephone Encounter (Signed)
Received call from pt with complaint of general malaise x1 week since restarting Ibrance 125 mg tablet.  Per MD pt to take Ibrance every other day and f/u in office in 1 week.  Pt educated and verbalized understanding.

## 2021-07-05 NOTE — Progress Notes (Signed)
Patient Care Team: Patient, [name] (Inactive) as PCP - General (General Practice) [name], MD as Consulting Physician (Hematology and Oncology) [name], MD as Consulting Physician (Radiation Oncology) Delice Bison, Charlestine Massed, NP as Nurse Practitioner (Hematology and Oncology) Rolm Bookbinder, MD as Consulting Physician (General Surgery)  DIAGNOSIS:    ICD-10-CM   1. Malignant neoplasm of central portion of left breast in female, estrogen receptor positive ([place])  C50.112    Z17.0       SUMMARY OF ONCOLOGIC HISTORY: Oncology History  Cancer of central portion of left female breast ([place])  04/02/2014 Genetic Testing   BRCA1 [id number] pathogenic mutation identified on gene sequce and del/dup analyses of BRCA1 and BRCA2.  BRCA1 p.H1283R VUS identified as well.  The report date is 04/02/2014.  UPDATE: BRCA1 D.[id number] VUS has been reclassified as a likely benign variant.  The reclassification date is 04/21/2020.   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 N0 M0 stage IA: Oncotype DX 13 (ROR 9%)   04/16/2014 Surgery   Left upper back melanoma resected no residual melanoma identified on re-resection margins negative   05/26/2014 - 06/08/2015 Anti-estrogen oral therapy   Tamoxifen 20 mg daily with a plan to switch her to aromatase inhibitors once she gets oophorectomy ( patient did not continue antiestrogen therapy by choice)   08/13/2014 Surgery   oophorectomy with total abdominal hysterectomy   09/08/2015 -  Anti-estrogen oral therapy   Resumed anastrozole after she found recurrence of breast cancer; stopped in January 2018, started letrozole 2.5 mg daily 10/12/2016; switched to Exemestane 25 mg daily on 01/03/17   09/16/2015 Surgery   Skin, left: IDC grade 3; 0.5 cm, margins negative, ER 95%, PR 10%,  HER-2 positive ratio 2.08   11/08/2015 - 02/2017 Chemotherapy   Herceptin every 3 weeks plus anastrozole   11/24/2015 - 01/14/2016 Radiation Therapy   Adjuvant radiation therapy by Dr. Sondra Come   01/2017 -  Anti-estrogen oral therapy   Exemestane daily (stopped others due to side effects), due to muscle aches and pains switch to tamoxifen 5 mg daily starting 12/18/2017   08/07/2019 Relapse/Recurrence   Bone scan: Sclerotic metastatic lesions involving left femoral neck, right ninth rib and entire L5 vertebra; MRI left hip: Diffuse signal abnormality L5 vertebral body with possible extraosseous extension   08/22/2019 - 09/03/2019 Radiation Therapy   Palliative radiation with Dr. Sondra Come to L5 and left proixmal femur.      CHIEF COMPLIANT: Follow-up of metastatic breast cancer on Ibrance and letrozole  INTERVAL HISTORY: Krystal [name] is a 55 y.o. with above-mentioned history of metastatic breast cancer with osseous metastases currently on treatment with Ibrance and letrozole. She reports to the clinic today for follow-up.  Patient was not feeling well over the past couple weeks and she stopped taking Ibrance about a week ago and she is starting to feel significantly better.  Did not have any fevers or chills.  Few days ago she felt slightly unwell with some cough but it has subsided.  She continues to have intermittent low back pain especially when she exerts herself.  ALLERGIES:  is allergic to dilaudid [hydromorphone] and codeine.  MEDICATIONS:  Current Outpatient Medications  Medication Sig Dispense Refill   calcium-vitamin D (OSCAL WITH D) 500-200 MG-UNIT tablet Take 1 tablet by mouth 2 (two) times daily. 180 tablet 3  IBRANCE 125 MG tablet TAKE 1 TABLET DAILY FOR 21 DAYS ON, 7 DAYS OFF REPEAT EVERY 28 DAYS 21 tablet 12   letrozole (FEMARA) 2.5 MG tablet Take 1 tablet (2.5 mg total) by mouth daily. 90 tablet 3   meloxicam (MOBIC) 7.5 MG tablet Take 1 tablet (7.5 mg total) by mouth daily as  needed for pain. 30 tablet 6   ondansetron (ZOFRAN) 4 MG tablet Take 1 tablet (4 mg total) by mouth every 8 (eight) hours as needed for nausea or vomiting. 20 tablet 3   traZODone (DESYREL) 50 MG tablet Take 1 tablet (50 mg total) by mouth at bedtime. 30 tablet 3   No current facility-administered medications for this visit.    PHYSICAL EXAMINATION: ECOG PERFORMANCE STATUS: 1 - Symptomatic but completely ambulatory  Vitals:   07/06/21 0831  BP: 133/76  Pulse: 86  Resp: (!) 97  Temp: 97.9 F (36.6 C)  SpO2: 97%   Filed Weights   07/06/21 0831  Weight: 244 lb 6.4 oz (110.9 kg)      LABORATORY DATA:  I have reviewed the data as listed CMP Latest Ref Rng & Units 04/06/2021 01/21/2021 09/23/2020  Glucose 70 - 99 mg/dL 130(H) 137(H) 121(H)  BUN 6 - 20 mg/dL _0 Creatinine 0.44 - 1.00 mg/dL 0.79 0.78 0.89  Sodium 135 - 145 mmol/L 143 139 141  Potassium 3.5 - 5.1 mmol/L 4.4 4.1 4.4  Chloride 98 - 111 mmol/L 104 103 103  CO2 22 - 32 mmol/L _1 Calcium 8.9 - 10.3 mg/dL 9.6 9.2 9.5  Total Protein 6.5 - 8.1 g/dL 7.4 7.3 7.5  Total Bilirubin 0.3 - 1.2 mg/dL 0.8 0.3 0.3  Alkaline Phos 38 - 126 U/L 60 49 55  AST 15 - 41 U/L 46(H) 35 21  ALT 0 - 44 U/L 77(H) 59(H) 46(H)    Lab Results  Component Value Date   WBC 2.8 (L) 07/06/2021   HGB 12.9 07/06/2021   HCT 37.5 07/06/2021   MCV 100.8 (H) 07/06/2021   PLT 214 07/06/2021   NEUTROABS 1.7 07/06/2021    ASSESSMENT & PLAN:  Cancer of central portion of left female breast ([place]) Bone scan 08/07/2019: Increase activity with sclerotic bone metastases involving left femoral neck right ninth rib and entire L5 vertebra   08/22/2019: PET CT scan: Metastatic disease in the chest with multiple lung nodules all subcentimeter size and hypermetabolic lymph nodes (right paratracheal node 7 mm, subcarinal node 1 cm, right hilar node 6 mm), retroperitoneal and upper pelvic common iliac lymph nodes, bone metastases especially L5, L4, T6,  right 10th rib, left proximal femur   Bone biopsy was not felt to be reliable in addition to the effect of radiation on the bone. Palliative radiation completed 09/03/2019 [facility]: BRCA1 mutation, TMB 5.36, MSI: Normal   Treatment plan: Ibrance with letrozole and Xgeva for bone metastases Letrozole toxicities: Tolerating letrozole extremely well without any adverse effects   Bone metastasis: Patient does not want to take bisphosphonates.  Encouraged her to exercise and take calcium in diet and supplements.   Ibrance toxicities: Low white blood cell count: Currently on 125 mg of Ibrance. I reduce the dosage of Ibrance to 100 mg 21 days on 7 days of   CT CAP 09/23/20: Slight int enl of lingular and LLL lung nodules, Stable bone mets CT CAP 04/04/2021: Increase in size of lymph nodes subcarinal lymph node 1.2 cm (previously 6 mm) right lower paratracheal lymph  node 6 mm, interval enlargement of lingular 9 mm (previously 5 mm) and left lower lobe lung nodules 6 mm previously 4 mm, stable bone metastases right lateral rib, L5, S1   Plan: Discussed option of talazoparib versus continuing Ibrance.  But we decided to continue with Ibrance for the time being.  Labs and scans and follow-up in 3 months.    No orders of the defined types were placed in this encounter.  The patient has a good understanding of the overall plan. she agrees with it. she will call with any problems that may develop before the next visit here.  Total time spent: 20 mins including face to face time and time spent for planning, charting and coordination of care  Rulon Eisenmenger, MD, MPH 07/06/2021  I, Thana Ates, am acting as scribe for Dr. [name].  I have reviewed the above documentation for accuracy and completeness, and I agree with the above.

## 2021-07-06 ENCOUNTER — Inpatient Hospital Stay: Payer: Commercial Managed Care - PPO | Admitting: Hematology and Oncology

## 2021-07-06 ENCOUNTER — Other Ambulatory Visit: Payer: Self-pay

## 2021-07-06 ENCOUNTER — Inpatient Hospital Stay: Payer: Commercial Managed Care - PPO | Attending: Hematology and Oncology

## 2021-07-06 DIAGNOSIS — C50112 Malignant neoplasm of central portion of left female breast: Secondary | ICD-10-CM | POA: Insufficient documentation

## 2021-07-06 DIAGNOSIS — Z1501 Genetic susceptibility to malignant neoplasm of breast: Secondary | ICD-10-CM | POA: Diagnosis not present

## 2021-07-06 DIAGNOSIS — Z9221 Personal history of antineoplastic chemotherapy: Secondary | ICD-10-CM | POA: Diagnosis not present

## 2021-07-06 DIAGNOSIS — C7951 Secondary malignant neoplasm of bone: Secondary | ICD-10-CM | POA: Insufficient documentation

## 2021-07-06 DIAGNOSIS — Z9013 Acquired absence of bilateral breasts and nipples: Secondary | ICD-10-CM | POA: Insufficient documentation

## 2021-07-06 DIAGNOSIS — Z923 Personal history of irradiation: Secondary | ICD-10-CM | POA: Diagnosis not present

## 2021-07-06 DIAGNOSIS — Z170 Estrogen receptor positive status [ER+]: Secondary | ICD-10-CM | POA: Diagnosis not present

## 2021-07-06 DIAGNOSIS — Z79811 Long term (current) use of aromatase inhibitors: Secondary | ICD-10-CM | POA: Insufficient documentation

## 2021-07-06 DIAGNOSIS — Z85820 Personal history of malignant melanoma of skin: Secondary | ICD-10-CM | POA: Insufficient documentation

## 2021-07-06 LAB — CBC WITH DIFFERENTIAL (CANCER CENTER ONLY)
Abs Immature Granulocytes: 0 10*3/uL (ref 0.00–0.07)
Basophils Absolute: 0.1 10*3/uL (ref 0.0–0.1)
Basophils Relative: 2 %
Eosinophils Absolute: 0.2 10*3/uL (ref 0.0–0.5)
Eosinophils Relative: 6 %
HCT: 37.5 % (ref 36.0–46.0)
Hemoglobin: 12.9 g/dL (ref 12.0–15.0)
Immature Granulocytes: 0 %
Lymphocytes Relative: 22 %
Lymphs Abs: 0.6 10*3/uL — ABNORMAL LOW (ref 0.7–4.0)
MCH: 34.7 pg — ABNORMAL HIGH (ref 26.0–34.0)
MCHC: 34.4 g/dL (ref 30.0–36.0)
MCV: 100.8 fL — ABNORMAL HIGH (ref 80.0–100.0)
Monocytes Absolute: 0.3 10*3/uL (ref 0.1–1.0)
Monocytes Relative: 10 %
Neutro Abs: 1.7 10*3/uL (ref 1.7–7.7)
Neutrophils Relative %: 60 %
Platelet Count: 214 10*3/uL (ref 150–400)
RBC: 3.72 MIL/uL — ABNORMAL LOW (ref 3.87–5.11)
RDW: 13.1 % (ref 11.5–15.5)
WBC Count: 2.8 10*3/uL — ABNORMAL LOW (ref 4.0–10.5)
nRBC: 0 % (ref 0.0–0.2)

## 2021-07-06 LAB — CMP (CANCER CENTER ONLY)
ALT: 55 U/L — ABNORMAL HIGH (ref 0–44)
AST: 37 U/L (ref 15–41)
Albumin: 4.2 g/dL (ref 3.5–5.0)
Alkaline Phosphatase: 54 U/L (ref 38–126)
Anion gap: 8 (ref 5–15)
BUN: 13 mg/dL (ref 6–20)
CO2: 29 mmol/L (ref 22–32)
Calcium: 9.4 mg/dL (ref 8.9–10.3)
Chloride: 102 mmol/L (ref 98–111)
Creatinine: 0.63 mg/dL (ref 0.44–1.00)
GFR, Estimated: >60 mL/min (ref >60)
Glucose, Bld: 131 mg/dL — ABNORMAL HIGH (ref 70–99)
Potassium: 4.1 mmol/L (ref 3.5–5.1)
Sodium: 139 mmol/L (ref 135–145)
Total Bilirubin: 0.6 mg/dL (ref 0.3–1.2)
Total Protein: 7.2 g/dL (ref 6.5–8.1)

## 2021-07-06 MED ORDER — PALBOCICLIB 100 MG PO TABS: 100.0000 mg | ORAL_TABLET | Freq: Every day | ORAL | 6 refills | Status: DC

## 2021-07-06 NOTE — Assessment & Plan Note (Signed)
Bone scan 08/07/2019: Increase activity with sclerotic bone metastases involving left femoral neck right ninth rib and entire L5 vertebra  08/22/2019: PET CT scan: Metastatic disease in the chest with multiple lung nodules all subcentimeter size and hypermetabolic lymph nodes (right paratracheal node 7 mm, subcarinal node 1 cm, right hilar node 6 mm), retroperitoneal and upper pelvic common iliac lymph nodes, bone metastases especially L5, L4, T6, right 10th rib, left proximal femur  Bone biopsy was not felt to be reliable in addition to the effect of radiation on the bone. Palliative radiation completed 09/03/2019 [facility]:BRCA1 mutation, TMB 5.36, MSI: Normal  Treatment plan:Ibrance withletrozoleand Xgeva for bone metastases Letrozole toxicities:Tolerating letrozole extremely well without any adverse effects  Bone metastasis: Patient does not want to take bisphosphonates.  Encouraged her to exercise and take calcium in diet and supplements.  Ibrance toxicities: Low white blood cell count: Currently on 125 mg of Ibrance.  CT CAP 09/23/20: Slight int enl of lingular and LLL lung nodules, Stable bone mets CT CAP 04/04/2021: Increase in size of lymph nodes subcarinal lymph node 1.2 cm (previously 6 mm) right lower paratracheal lymph node 6 mm, interval enlargement of lingular 9 mm (previously 5 mm) and left lower lobe lung nodules 6 mm previously 4 mm, stable bone metastases right lateral rib, L5, S1  Plan: Discussed option of talazoparib versus continuing Ibrance.  But we decided to continue with Ibrance for the time being.  Labs and scans and follow-up in 3 months.

## 2021-07-21 ENCOUNTER — Other Ambulatory Visit: Payer: Self-pay | Admitting: Hematology and Oncology

## 2021-07-21 ENCOUNTER — Other Ambulatory Visit: Payer: Self-pay

## 2021-08-23 ENCOUNTER — Telehealth: Payer: Self-pay | Admitting: *Deleted

## 2021-08-23 NOTE — Telephone Encounter (Signed)
Received call from pt requesting advice from MD if okay to use OTC Voltaren gel PRN for knee pain while on Ibrance.  Per MD okay to proceed with treatment.  Pt verbalized understanding.

## 2021-10-03 ENCOUNTER — Other Ambulatory Visit: Payer: Self-pay | Admitting: *Deleted

## 2021-10-03 DIAGNOSIS — Z170 Estrogen receptor positive status [ER+]: Secondary | ICD-10-CM

## 2021-10-03 DIAGNOSIS — C50112 Malignant neoplasm of central portion of left female breast: Secondary | ICD-10-CM

## 2021-10-04 ENCOUNTER — Inpatient Hospital Stay: Payer: Commercial Managed Care - PPO | Attending: Hematology and Oncology

## 2021-10-04 ENCOUNTER — Ambulatory Visit (HOSPITAL_COMMUNITY)
Admission: RE | Admit: 2021-10-04 | Discharge: 2021-10-04 | Disposition: A | Discharge status: Home / Self Care | Payer: Commercial Managed Care - PPO | Source: Ambulatory Visit | Attending: Hematology and Oncology | Admitting: Hematology and Oncology

## 2021-10-04 ENCOUNTER — Other Ambulatory Visit: Payer: Self-pay

## 2021-10-04 DIAGNOSIS — Z170 Estrogen receptor positive status [ER+]: Secondary | ICD-10-CM | POA: Insufficient documentation

## 2021-10-04 DIAGNOSIS — C7951 Secondary malignant neoplasm of bone: Secondary | ICD-10-CM | POA: Insufficient documentation

## 2021-10-04 DIAGNOSIS — C50112 Malignant neoplasm of central portion of left female breast: Secondary | ICD-10-CM | POA: Diagnosis not present

## 2021-10-04 LAB — CBC WITH DIFFERENTIAL (CANCER CENTER ONLY)
Abs Immature Granulocytes: 0 10*3/uL (ref 0.00–0.07)
Basophils Absolute: 0 10*3/uL (ref 0.0–0.1)
Basophils Relative: 2 %
Eosinophils Absolute: 0.1 10*3/uL (ref 0.0–0.5)
Eosinophils Relative: 4 %
HCT: 36.3 % (ref 36.0–46.0)
Hemoglobin: 12.7 g/dL (ref 12.0–15.0)
Immature Granulocytes: 0 %
Lymphocytes Relative: 24 %
Lymphs Abs: 0.5 10*3/uL — ABNORMAL LOW (ref 0.7–4.0)
MCH: 34.4 pg — ABNORMAL HIGH (ref 26.0–34.0)
MCHC: 35 g/dL (ref 30.0–36.0)
MCV: 98.4 fL (ref 80.0–100.0)
Monocytes Absolute: 0.1 10*3/uL (ref 0.1–1.0)
Monocytes Relative: 6 %
Neutro Abs: 1.4 10*3/uL — ABNORMAL LOW (ref 1.7–7.7)
Neutrophils Relative %: 64 %
Platelet Count: 209 10*3/uL (ref 150–400)
RBC: 3.69 MIL/uL — ABNORMAL LOW (ref 3.87–5.11)
RDW: 13.2 % (ref 11.5–15.5)
WBC Count: 2.1 10*3/uL — ABNORMAL LOW (ref 4.0–10.5)
nRBC: 0 % (ref 0.0–0.2)

## 2021-10-04 LAB — CMP (CANCER CENTER ONLY)
ALT: 42 U/L (ref 0–44)
AST: 23 U/L (ref 15–41)
Albumin: 4.3 g/dL (ref 3.5–5.0)
Alkaline Phosphatase: 65 U/L (ref 38–126)
Anion gap: 7 (ref 5–15)
BUN: 16 mg/dL (ref 6–20)
CO2: 31 mmol/L (ref 22–32)
Calcium: 9.5 mg/dL (ref 8.9–10.3)
Chloride: 104 mmol/L (ref 98–111)
Creatinine: 0.85 mg/dL (ref 0.44–1.00)
GFR, Estimated: >60 mL/min (ref >60)
Glucose, Bld: 119 mg/dL — ABNORMAL HIGH (ref 70–99)
Potassium: 4.3 mmol/L (ref 3.5–5.1)
Sodium: 142 mmol/L (ref 135–145)
Total Bilirubin: 0.3 mg/dL (ref 0.3–1.2)
Total Protein: 7.2 g/dL (ref 6.5–8.1)

## 2021-10-04 MED ORDER — IOHEXOL 300 MG/ML  SOLN
100.0000 mL | Freq: Once | INTRAMUSCULAR | Status: AC | PRN
  Administered 2021-10-04: 100 mL via INTRAVENOUS

## 2021-10-04 MED ORDER — SODIUM CHLORIDE (PF) 0.9 % IJ SOLN
INTRAMUSCULAR | Status: AC
  Filled 2021-10-04: qty 50

## 2021-10-05 NOTE — Assessment & Plan Note (Addendum)
Bone scan 08/07/2019: Increase activity with sclerotic bone metastases involving left femoral neck right ninth rib and entire L5 vertebra ?? ?08/22/2019: PET CT scan: Metastatic disease in the chest with multiple lung nodules all subcentimeter size and hypermetabolic lymph nodes (right paratracheal node 7 mm, subcarinal node 1 cm, right hilar node 6 mm), retroperitoneal and upper pelvic common iliac lymph nodes, bone metastases especially L5, L4, T6, right 10th rib, left proximal femur ?? ?Bone biopsy was not felt to be reliable in addition to the effect of radiation on the bone. ?Palliative radiation completed 09/03/2019 ?[facility]:?BRCA1 mutation, TMB 5.36, MSI: Normal ?? ?Treatment plan:?Ibrance with?letrozole?and Xgeva for bone metastases ?Letrozole toxicities:?Tolerating letrozole extremely well without any adverse effects ?? ?Bone metastasis: Patient does not want to take bisphosphonates. ?Encouraged her to exercise and take calcium in diet and supplements. ?? ?Ibrance toxicities: ?Low white blood cell count:?Currently on 125 mg of Ibrance. ?I reduce the dosage of Ibrance to 100 mg 21 days on 7 days off ?? ?CT CAP 10/05/2021: Progressive sclerotic bone metastases T10 and L2 and left sacrum and iliac bones, new bone lesions involving sternum, spine, ribs right scapula and pelvis.  Stable and enlarging mediastinal and right hilar nodes, slight interval increase in size of the lingular left lower lobe lung nodules ? ?Treatment plan: We discussed different treatment options with the patient including telemetry.  And switching her from [place] to [employer] and Faslodex. ?She is agreeable to the latter approach. ?We will titrate the dosage of Faslodex from 50 twice daily all the way to 150 twice daily. ? ?Return to clinic in 3 weeks for toxicity evaluation and follow-up with Jenny Reichmann. ?She will get an injection in 1 week and after that loading dose of Faslodex when she sees [name]. ?

## 2021-10-05 NOTE — Progress Notes (Signed)
Patient Care Team: Patient, [name] (Inactive) as PCP - General (General Practice) [name], MD as Consulting Physician (Hematology and Oncology) [name], MD as Consulting Physician (Radiation Oncology) Delice Bison, Charlestine Massed, NP as Nurse Practitioner (Hematology and Oncology) Rolm Bookbinder, MD as Consulting Physician (General Surgery)  DIAGNOSIS:    ICD-10-CM   1. Malignant neoplasm of central portion of left breast in female, estrogen receptor positive ([place])  C50.112 CA 27.29   Z17.0       SUMMARY OF ONCOLOGIC HISTORY: Oncology History  Cancer of central portion of left female breast ([place])  04/02/2014 Genetic Testing   BRCA1 [id number] pathogenic mutation identified on gene sequce and del/dup analyses of BRCA1 and BRCA2.  BRCA1 p.[id number] VUS identified as well.  The report date is 04/02/2014.  UPDATE: BRCA1 [id number] VUS has been reclassified as a likely benign variant.  The reclassification date is 04/21/2020.   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 N0 M0 stage IA: Oncotype DX 13 (ROR 9%)   04/16/2014 Surgery   Left upper back melanoma resected no residual melanoma identified on re-resection margins negative   05/26/2014 - 06/08/2015 Anti-estrogen oral therapy   Tamoxifen 20 mg daily with a plan to switch her to aromatase inhibitors once she gets oophorectomy ( patient did not continue antiestrogen therapy by choice)   08/13/2014 Surgery   oophorectomy with total abdominal hysterectomy   09/08/2015 -  Anti-estrogen oral therapy   Resumed anastrozole after she found recurrence of breast cancer; stopped in January 2018, started letrozole 2.5 mg daily 10/12/2016; switched to Exemestane 25 mg daily on 01/03/17   09/16/2015 Surgery   Skin, left: IDC grade 3; 0.5 cm, margins negative, ER 95%,  PR 10%, HER-2 positive ratio 2.08   11/08/2015 - 02/2017 Chemotherapy   Herceptin every 3 weeks plus anastrozole   11/24/2015 - 01/14/2016 Radiation Therapy   Adjuvant radiation therapy by Dr. Sondra Come   01/2017 -  Anti-estrogen oral therapy   Exemestane daily (stopped others due to side effects), due to muscle aches and pains switch to tamoxifen 5 mg daily starting 12/18/2017   08/07/2019 Relapse/Recurrence   Bone scan: Sclerotic metastatic lesions involving left femoral neck, right ninth rib and entire L5 vertebra; MRI left hip: Diffuse signal abnormality L5 vertebral body with possible extraosseous extension   08/22/2019 - 09/03/2019 Radiation Therapy   Palliative radiation with Dr. Sondra Come to L5 and left proixmal femur.      CHIEF COMPLIANT: Follow-up of metastatic breast cancer on Ibrance and letrozole  INTERVAL HISTORY: Krystal [name] is a 56 y.o. with above-mentioned history of metastatic breast cancer with osseous metastases currently on treatment with Ibrance and letrozole. She reports to the clinic today for follow-up.  She had CT scans and is here to discuss results.  She is feeling very much sad and and fatigue.  She tells me that she is tired of this journey with metastatic breast cancer.  Emotionally she is exhausted.  Every ache and pain she thinks she has recurrence and metastatic disease.  Headaches has been a problem lately as well.  ALLERGIES:  is allergic to dilaudid [hydromorphone] and codeine.  MEDICATIONS:  Current Outpatient Medications  Medication Sig Dispense Refill   calcium-vitamin D (OSCAL WITH D) 500-200 MG-UNIT tablet Take 1 tablet by mouth 2 (two) times daily. 180 tablet 3  esomeprazole (NEXIUM) 40 MG capsule Take 1 capsule by mouth daily.     letrozole (FEMARA) 2.5 MG tablet Take 1 tablet (2.5 mg total) by mouth daily. 90 tablet 3   loratadine (CLARITIN) 10 MG tablet Take 1 tablet by mouth daily.     meloxicam (MOBIC) 7.5 MG tablet Take 1 tablet (7.5 mg total) by  mouth daily as needed for pain. 30 tablet 6   ondansetron (ZOFRAN) 4 MG tablet Take 1 tablet (4 mg total) by mouth every 8 (eight) hours as needed for nausea or vomiting. 20 tablet 3   palbociclib (IBRANCE) 100 MG tablet Take 1 tablet (100 mg total) by mouth daily. Take for 21 days on, 7 days off, repeat every 28 days. 21 tablet 6   traZODone (DESYREL) 50 MG tablet TAKE 1 TABLET(50 MG) BY MOUTH AT BEDTIME 30 tablet 3   No current facility-administered medications for this visit.    PHYSICAL EXAMINATION: ECOG PERFORMANCE STATUS: 1 - Symptomatic but completely ambulatory  Vitals:   10/06/21 0816  BP: 118/73  Pulse: 84  Resp: 18  Temp: (!) 97.2 F (36.2 C)  SpO2: 97%   Filed Weights   10/06/21 0816  Weight: 245 lb 9.6 oz (111.4 kg)      LABORATORY DATA:  I have reviewed the data as listed CMP Latest Ref Rng & Units 10/04/2021 07/06/2021 04/06/2021  Glucose 70 - 99 mg/dL 119(H) 131(H) 130(H)  BUN 6 - 20 mg/dL 16 13 11   Creatinine 0.44 - 1.00 mg/dL 0.85 0.63 0.79  Sodium 135 - 145 mmol/L 142 139 143  Potassium 3.5 - 5.1 mmol/L 4.3 4.1 4.4  Chloride 98 - 111 mmol/L 104 102 104  CO2 22 - 32 mmol/L 31 29 28   Calcium 8.9 - 10.3 mg/dL 9.5 9.4 9.6  Total Protein 6.5 - 8.1 g/dL 7.2 7.2 7.4  Total Bilirubin 0.3 - 1.2 mg/dL 0.3 0.6 0.8  Alkaline Phos 38 - 126 U/L 65 54 60  AST 15 - 41 U/L 23 37 46(H)  ALT 0 - 44 U/L 42 55(H) 77(H)    Lab Results  Component Value Date   WBC 2.1 (L) 10/04/2021   HGB 12.7 10/04/2021   HCT 36.3 10/04/2021   MCV 98.4 10/04/2021   PLT 209 10/04/2021   NEUTROABS 1.4 (L) 10/04/2021    ASSESSMENT & PLAN:  Cancer of central portion of left female breast ([place]) Bone scan 08/07/2019: Increase activity with sclerotic bone metastases involving left femoral neck right ninth rib and entire L5 vertebra   08/22/2019: PET CT scan: Metastatic disease in the chest with multiple lung nodules all subcentimeter size and hypermetabolic lymph nodes (right paratracheal  node 7 mm, subcarinal node 1 cm, right hilar node 6 mm), retroperitoneal and upper pelvic common iliac lymph nodes, bone metastases especially L5, L4, T6, right 10th rib, left proximal femur   Bone biopsy was not felt to be reliable in addition to the effect of radiation on the bone. Palliative radiation completed 09/03/2019 [facility]: BRCA1 mutation, TMB 5.36, MSI: Normal   Treatment plan: Ibrance with letrozole and Xgeva for bone metastases Letrozole toxicities: Tolerating letrozole extremely well without any adverse effects   Bone metastasis: Patient does not want to take bisphosphonates.  Encouraged her to exercise and take calcium in diet and supplements.   Ibrance toxicities: Low white blood cell count: Currently on 125 mg of Ibrance. I reduce the dosage of Ibrance to 100 mg 21 days on 7 days off   CT  CAP 10/05/2021: Progressive sclerotic bone metastases T10 and L2 and left sacrum and iliac bones, new bone lesions involving sternum, spine, ribs right scapula and pelvis.  Stable and enlarging mediastinal and right hilar nodes, slight interval increase in size of the lingular left lower lobe lung nodules  Treatment plan: We discussed different treatment options with the patient including telemetry.  And switching her from [place] to [employer] and Faslodex. She is agreeable to the latter approach. We will titrate the dosage of Faslodex from 50 twice daily all the way to 150 twice daily.  Return to clinic in 3 weeks for toxicity evaluation and follow-up with Jenny Reichmann. She will get an injection in 1 week and after that loading dose of Faslodex when she sees [name].    Orders Placed This Encounter  Procedures   CA 27.29    Standing Status:   Standing    Number of Occurrences:   100    Standing Expiration Date:   10/07/2022   The patient has a good understanding of the overall plan. she agrees with it. she will call with any problems that may develop before the next visit here.  Total time  spent: 30 mins including face to face time and time spent for planning, charting and coordination of care  Rulon Eisenmenger, MD, MPH 10/06/2021  I, Thana Ates, am acting as scribe for Dr. [name].  I have reviewed the above documentation for accuracy and completeness, and I agree with the above.

## 2021-10-06 ENCOUNTER — Inpatient Hospital Stay: Payer: Commercial Managed Care - PPO | Attending: Hematology and Oncology | Admitting: Hematology and Oncology

## 2021-10-06 ENCOUNTER — Other Ambulatory Visit: Payer: Self-pay

## 2021-10-06 DIAGNOSIS — Z5111 Encounter for antineoplastic chemotherapy: Secondary | ICD-10-CM | POA: Diagnosis present

## 2021-10-06 DIAGNOSIS — Z79899 Other long term (current) drug therapy: Secondary | ICD-10-CM | POA: Insufficient documentation

## 2021-10-06 DIAGNOSIS — Z85820 Personal history of malignant melanoma of skin: Secondary | ICD-10-CM | POA: Diagnosis not present

## 2021-10-06 DIAGNOSIS — Z79811 Long term (current) use of aromatase inhibitors: Secondary | ICD-10-CM | POA: Diagnosis not present

## 2021-10-06 DIAGNOSIS — C50112 Malignant neoplasm of central portion of left female breast: Secondary | ICD-10-CM | POA: Diagnosis present

## 2021-10-06 DIAGNOSIS — Z9221 Personal history of antineoplastic chemotherapy: Secondary | ICD-10-CM | POA: Insufficient documentation

## 2021-10-06 DIAGNOSIS — Z170 Estrogen receptor positive status [ER+]: Secondary | ICD-10-CM | POA: Diagnosis not present

## 2021-10-06 DIAGNOSIS — Z79818 Long term (current) use of other agents affecting estrogen receptors and estrogen levels: Secondary | ICD-10-CM | POA: Diagnosis not present

## 2021-10-06 DIAGNOSIS — D72818 Other decreased white blood cell count: Secondary | ICD-10-CM | POA: Diagnosis not present

## 2021-10-06 DIAGNOSIS — C7951 Secondary malignant neoplasm of bone: Secondary | ICD-10-CM | POA: Insufficient documentation

## 2021-10-06 DIAGNOSIS — Z923 Personal history of irradiation: Secondary | ICD-10-CM | POA: Diagnosis not present

## 2021-10-06 MED ORDER — ABEMACICLIB 50 MG PO TABS: 50.0000 mg | ORAL_TABLET | Freq: Two times a day (BID) | ORAL | 0 refills | Status: DC

## 2021-10-07 ENCOUNTER — Other Ambulatory Visit: Payer: Self-pay | Admitting: *Deleted

## 2021-10-07 ENCOUNTER — Telehealth: Payer: Self-pay

## 2021-10-07 ENCOUNTER — Other Ambulatory Visit (HOSPITAL_COMMUNITY): Payer: Self-pay

## 2021-10-07 DIAGNOSIS — C50112 Malignant neoplasm of central portion of left female breast: Secondary | ICD-10-CM

## 2021-10-07 DIAGNOSIS — Z170 Estrogen receptor positive status [ER+]: Secondary | ICD-10-CM

## 2021-10-07 MED ORDER — ABEMACICLIB 50 MG PO TABS
50.0000 mg | ORAL_TABLET | Freq: Two times a day (BID) | ORAL | 0 refills | Status: DC
  Filled 2021-10-07: qty 70, 35d supply, fill #0

## 2021-10-07 MED ORDER — ABEMACICLIB 50 MG PO TABS: 50.0000 mg | ORAL_TABLET | Freq: Two times a day (BID) | ORAL | 0 refills | Status: DC

## 2021-10-07 NOTE — Telephone Encounter (Signed)
Oral Oncology Pharmacist Encounter ? ?Received new prescription for abemaciclib (Verzenio) for the treatment of metastatic breast cancer in conjunction with faslodex, planned duration until disease progression or unacceptable toxicity. ? ?Labs from 10/04/21 (CBC, CMP) assessed, no interventions needed. Prescription dose and frequency assessed. Prescription sent to [facility] as patient must fill through there according to insurance.  ? ?Current medication list in Epic reviewed, DDIs with verzenio identified: none ? ?Evaluated chart and no patient barriers to medication adherence noted.  ? ?Patient agreement for treatment documented in MD note on 10/06/2021. ? ?Prescription has been e-scribed to the [facility] for benefits analysis and approval. ? ?Oral Oncology Clinic will continue to follow for insurance authorization, copayment issues, initial counseling and start date. ? ?Drema Halon, PharmD ?Hematology/Oncology Clinical Pharmacist ?[facility] ?[phone] ?10/07/2021 10:24 AM ? ?

## 2021-10-07 NOTE — Telephone Encounter (Signed)
Oral Oncology Patient Advocate Encounter ? ?Prior Authorization for Melynda Keller has been approved.   ? ?PA# [id number] ?Effective dates: 09/07/21 through 10/07/22 ? ?Patient must fill at [facility] ? ?Oral Oncology Clinic will continue to follow.  ? ?Wynn Maudlin CPHT ?Specialty Pharmacy Patient Advocate ?[place] ?Phone [phone] ?Fax [phone] ?10/07/2021 10:20 AM ? ?

## 2021-10-07 NOTE — Telephone Encounter (Signed)
Oral Oncology Patient Advocate Encounter ?  ?Received notification from [employer] that prior authorization for Verzenio is required. ?  ?PA submitted on CoverMyMeds ?Key [id number] ?Status is pending ?  ?Oral Oncology Clinic will continue to follow. ? ?Wynn Maudlin CPHT ?Specialty Pharmacy Patient Advocate ?[place] ?Phone [phone] ?Fax [phone] ?10/07/2021 9:44 AM ? ? ?

## 2021-10-11 ENCOUNTER — Other Ambulatory Visit: Payer: Self-pay | Admitting: *Deleted

## 2021-10-11 ENCOUNTER — Telehealth: Payer: Self-pay | Admitting: *Deleted

## 2021-10-11 DIAGNOSIS — Z170 Estrogen receptor positive status [ER+]: Secondary | ICD-10-CM

## 2021-10-11 MED ORDER — VENLAFAXINE HCL ER 37.5 MG PO CP24: 37.5000 mg | ORAL_CAPSULE | Freq: Every day | ORAL | 0 refills | Status: DC

## 2021-10-11 NOTE — Telephone Encounter (Signed)
Received call from pt requesting medication for anxiety and depression.  Pt states since recent diagnoses she has been more depressed.  Per MD pt to be prescribed Effexor 37.5 mg p.o daily and may increase to 75 mg p.o daily after 2 weeks if needed. Pt educated and verbalized understanding.  ?

## 2021-10-14 ENCOUNTER — Other Ambulatory Visit: Payer: Self-pay

## 2021-10-14 ENCOUNTER — Inpatient Hospital Stay: Payer: Commercial Managed Care - PPO

## 2021-10-14 VITALS — BP 131/86 | HR 82 | Temp 98.9°F | Resp 18

## 2021-10-14 DIAGNOSIS — C7951 Secondary malignant neoplasm of bone: Secondary | ICD-10-CM

## 2021-10-14 DIAGNOSIS — C50919 Malignant neoplasm of unspecified site of unspecified female breast: Secondary | ICD-10-CM

## 2021-10-14 DIAGNOSIS — Z5111 Encounter for antineoplastic chemotherapy: Secondary | ICD-10-CM | POA: Diagnosis not present

## 2021-10-14 MED ORDER — PROCHLORPERAZINE MALEATE 10 MG PO TABS: 10.0000 mg | ORAL_TABLET | Freq: Four times a day (QID) | ORAL | 2 refills | Status: DC | PRN

## 2021-10-14 MED ORDER — FULVESTRANT 250 MG/5ML IM SOSY
500.0000 mg | PREFILLED_SYRINGE | Freq: Once | INTRAMUSCULAR | Status: AC
  Administered 2021-10-14: 500 mg via INTRAMUSCULAR
  Filled 2021-10-14: qty 10

## 2021-10-14 MED ORDER — ONDANSETRON HCL 8 MG PO TABS: 8.0000 mg | ORAL_TABLET | Freq: Three times a day (TID) | ORAL | 2 refills | Status: DC | PRN

## 2021-10-14 NOTE — Telephone Encounter (Signed)
Oral Chemotherapy Pharmacist Encounter ? ?I spoke with patient for overview of: Verzenio for the treatment of metastatic breast cancer, hormone-receptor positive breast cancer, in combination with faslodex, planned duration until disease progression or unacceptable toxicity.  ? ?Counseled patient on administration, dosing, side effects, monitoring, drug-food interactions, safe handling, storage, and disposal. ? ?Patient will take Verzenio '50mg'$  tablets, 1 tablet by mouth twice daily without regard to food x2 weeks. ? ?Patient knows to avoid grapefruit and grapefruit juice. ? ?Faslodex first injection on 10/14/21 ? ?Verzenio start date: 10/18/2021 ? ?Adverse effects include but are not limited to: diarrhea, fatigue, nausea, abdominal pain, decreased blood counts, and increased liver function tests, and joint pains. ?Severe, life-threatening, and/or fatal interstitial lung disease (ILD) and/or pneumonitis may occur with CDK 4/6 inhibitors. ? ?Patient has anti-emetic on hand and knows to take it if nausea develops.   ?Patient will obtain anti diarrheal and alert the office of 4 or more loose stools above baseline. Patient has imodium at home and will take if needed. ? ?Reviewed with patient importance of keeping a medication schedule and plan for any missed doses. No barriers to medication adherence identified. ? ?Medication reconciliation performed and medication/allergy list updated. ? ?Ship broker for [employer] has been obtained. ?Test claim at the pharmacy revealed copayment $0 for 1st fill of 28 days. ?Patient receives medication from [facility].  ? ?Patient informed the pharmacy will reach out 5-7 days prior to needing next fill of Verzenio to coordinate continued medication acquisition to prevent break in therapy. ? ?All questions answered. ? ?Ms. Ching voiced understanding and appreciation.  ? ?Medication education handout placed in mail for patient. Patient knows to call the office with  questions or concerns. Oral Chemotherapy Clinic phone number provided to patient.  ? ?Drema Halon, PharmD ?Hematology/Oncology Clinical Pharmacist ?[facility] ?[phone] ?10/14/2021   9:13 AM ? ? ?

## 2021-10-14 NOTE — Patient Instructions (Signed)
Fulvestrant injection °What is this medication? °FULVESTRANT (ful VES trant) blocks the effects of estrogen. It is used to treat breast cancer. °This medicine may be used for other purposes; ask your health care provider or pharmacist if you have questions. °COMMON BRAND NAME(S): FASLODEX °What should I tell my care team before I take this medication? °They need to know if you have any of these conditions: °bleeding disorders °liver disease °low blood counts, like low white cell, platelet, or red cell counts °an unusual or allergic reaction to fulvestrant, other medicines, foods, dyes, or preservatives °pregnant or trying to get pregnant °breast-feeding °How should I use this medication? °This medicine is for injection into a muscle. It is usually given by a health care professional in a hospital or clinic setting. °Talk to your pediatrician regarding the use of this medicine in children. Special care may be needed. °Overdosage: If you think you have taken too much of this medicine contact a poison control center or emergency room at once. °NOTE: This medicine is only for you. Do not share this medicine with others. °What if I miss a dose? °It is important not to miss your dose. Call your doctor or health care professional if you are unable to keep an appointment. °What may interact with this medication? °medicines that treat or prevent blood clots like warfarin, enoxaparin, dalteparin, apixaban, dabigatran, and rivaroxaban °This list may not describe all possible interactions. Give your health care provider a list of all the medicines, herbs, non-prescription drugs, or dietary supplements you use. Also tell them if you smoke, drink alcohol, or use illegal drugs. Some items may interact with your medicine. °What should I watch for while using this medication? °Your condition will be monitored carefully while you are receiving this medicine. You will need important blood work done while you are taking this  medicine. °Do not become pregnant while taking this medicine or for at least 1 year after stopping it. Women of child-bearing potential will need to have a negative pregnancy test before starting this medicine. Women should inform their doctor if they wish to become pregnant or think they might be pregnant. There is a potential for serious side effects to an unborn child. Men should inform their doctors if they wish to father a child. This medicine may lower sperm counts. Talk to your health care professional or pharmacist for more information. Do not breast-feed an infant while taking this medicine or for 1 year after the last dose. °What side effects may I notice from receiving this medication? °Side effects that you should report to your doctor or health care professional as soon as possible: °allergic reactions like skin rash, itching or hives, swelling of the face, lips, or tongue °feeling faint or lightheaded, falls °pain, tingling, numbness, or weakness in the legs °signs and symptoms of infection like fever or chills; cough; flu-like symptoms; sore throat °vaginal bleeding °Side effects that usually do not require medical attention (report to your doctor or health care professional if they continue or are bothersome): °aches, pains °constipation °diarrhea °headache °hot flashes °nausea, vomiting °pain at site where injected °stomach pain °This list may not describe all possible side effects. Call your doctor for medical advice about side effects. You may report side effects to FDA at 1-800-FDA-1088. °Where should I keep my medication? °This drug is given in a hospital or clinic and will not be stored at home. °NOTE: This sheet is a summary. It may not cover all possible information. If you have   questions about this medicine, talk to your doctor, pharmacist, or health care provider. °© 2022 [employer] (2017-11-06 00:00:00) ° °

## 2021-10-19 ENCOUNTER — Other Ambulatory Visit: Payer: Self-pay | Admitting: Hematology and Oncology

## 2021-10-28 ENCOUNTER — Inpatient Hospital Stay: Payer: Commercial Managed Care - PPO

## 2021-10-28 ENCOUNTER — Other Ambulatory Visit: Payer: Self-pay

## 2021-10-28 VITALS — BP 135/74 | HR 89 | Temp 98.9°F | Resp 18

## 2021-10-28 DIAGNOSIS — C7951 Secondary malignant neoplasm of bone: Secondary | ICD-10-CM

## 2021-10-28 DIAGNOSIS — C50919 Malignant neoplasm of unspecified site of unspecified female breast: Secondary | ICD-10-CM

## 2021-10-28 DIAGNOSIS — Z5111 Encounter for antineoplastic chemotherapy: Secondary | ICD-10-CM | POA: Diagnosis not present

## 2021-10-28 MED ORDER — FULVESTRANT 250 MG/5ML IM SOSY
500.0000 mg | PREFILLED_SYRINGE | Freq: Once | INTRAMUSCULAR | Status: AC
  Administered 2021-10-28: 500 mg via INTRAMUSCULAR
  Filled 2021-10-28: qty 10

## 2021-11-02 ENCOUNTER — Other Ambulatory Visit: Payer: Self-pay | Admitting: *Deleted

## 2021-11-02 MED ORDER — TRAZODONE HCL 50 MG PO TABS: ORAL_TABLET | ORAL | 3 refills | Status: DC

## 2021-11-02 MED ORDER — VENLAFAXINE HCL ER 37.5 MG PO CP24: 37.5000 mg | ORAL_CAPSULE | Freq: Every day | ORAL | 0 refills | Status: DC

## 2021-11-03 ENCOUNTER — Ambulatory Visit: Payer: Commercial Managed Care - PPO | Admitting: Pharmacist

## 2021-11-03 ENCOUNTER — Ambulatory Visit: Payer: Commercial Managed Care - PPO

## 2021-11-06 ENCOUNTER — Encounter: Payer: Self-pay | Admitting: Hematology and Oncology

## 2021-11-09 ENCOUNTER — Telehealth: Payer: Self-pay | Admitting: Pharmacist

## 2021-11-09 NOTE — Telephone Encounter (Signed)
.  Called patient to schedule appointment per 3/6 inbasket, patient is aware of date and time.   ?

## 2021-11-10 ENCOUNTER — Inpatient Hospital Stay: Payer: Commercial Managed Care - PPO

## 2021-11-10 ENCOUNTER — Other Ambulatory Visit: Payer: Self-pay

## 2021-11-10 ENCOUNTER — Inpatient Hospital Stay: Payer: Commercial Managed Care - PPO | Admitting: Pharmacist

## 2021-11-10 ENCOUNTER — Inpatient Hospital Stay: Payer: Commercial Managed Care - PPO | Attending: Hematology and Oncology

## 2021-11-10 VITALS — BP 128/76 | HR 94 | Temp 97.7°F | Resp 18 | Ht 64.0 in | Wt 240.3 lb

## 2021-11-10 DIAGNOSIS — C7951 Secondary malignant neoplasm of bone: Secondary | ICD-10-CM

## 2021-11-10 DIAGNOSIS — Z79899 Other long term (current) drug therapy: Secondary | ICD-10-CM | POA: Insufficient documentation

## 2021-11-10 DIAGNOSIS — Z5111 Encounter for antineoplastic chemotherapy: Secondary | ICD-10-CM | POA: Diagnosis not present

## 2021-11-10 DIAGNOSIS — Z79818 Long term (current) use of other agents affecting estrogen receptors and estrogen levels: Secondary | ICD-10-CM | POA: Insufficient documentation

## 2021-11-10 DIAGNOSIS — C50112 Malignant neoplasm of central portion of left female breast: Secondary | ICD-10-CM | POA: Diagnosis present

## 2021-11-10 DIAGNOSIS — Z170 Estrogen receptor positive status [ER+]: Secondary | ICD-10-CM | POA: Diagnosis not present

## 2021-11-10 DIAGNOSIS — C50919 Malignant neoplasm of unspecified site of unspecified female breast: Secondary | ICD-10-CM

## 2021-11-10 LAB — CBC WITH DIFFERENTIAL (CANCER CENTER ONLY)
Abs Immature Granulocytes: 0.01 10*3/uL (ref 0.00–0.07)
Basophils Absolute: 0 10*3/uL (ref 0.0–0.1)
Basophils Relative: 1 %
Eosinophils Absolute: 0.2 10*3/uL (ref 0.0–0.5)
Eosinophils Relative: 5 %
HCT: 37.1 % (ref 36.0–46.0)
Hemoglobin: 12.5 g/dL (ref 12.0–15.0)
Immature Granulocytes: 0 %
Lymphocytes Relative: 21 %
Lymphs Abs: 0.8 10*3/uL (ref 0.7–4.0)
MCH: 33.2 pg (ref 26.0–34.0)
MCHC: 33.7 g/dL (ref 30.0–36.0)
MCV: 98.4 fL (ref 80.0–100.0)
Monocytes Absolute: 0.2 10*3/uL (ref 0.1–1.0)
Monocytes Relative: 6 %
Neutro Abs: 2.5 10*3/uL (ref 1.7–7.7)
Neutrophils Relative %: 67 %
Platelet Count: 194 10*3/uL (ref 150–400)
RBC: 3.77 MIL/uL — ABNORMAL LOW (ref 3.87–5.11)
RDW: 12.7 % (ref 11.5–15.5)
WBC Count: 3.7 10*3/uL — ABNORMAL LOW (ref 4.0–10.5)
nRBC: 0 % (ref 0.0–0.2)

## 2021-11-10 LAB — CMP (CANCER CENTER ONLY)
ALT: 33 U/L (ref 0–44)
AST: 23 U/L (ref 15–41)
Albumin: 4.2 g/dL (ref 3.5–5.0)
Alkaline Phosphatase: 81 U/L (ref 38–126)
Anion gap: 7 (ref 5–15)
BUN: 14 mg/dL (ref 6–20)
CO2: 31 mmol/L (ref 22–32)
Calcium: 9.4 mg/dL (ref 8.9–10.3)
Chloride: 104 mmol/L (ref 98–111)
Creatinine: 0.98 mg/dL (ref 0.44–1.00)
GFR, Estimated: >60 mL/min (ref >60)
Glucose, Bld: 96 mg/dL (ref 70–99)
Potassium: 4.2 mmol/L (ref 3.5–5.1)
Sodium: 142 mmol/L (ref 135–145)
Total Bilirubin: 0.4 mg/dL (ref 0.3–1.2)
Total Protein: 7.2 g/dL (ref 6.5–8.1)

## 2021-11-10 MED ORDER — FULVESTRANT 250 MG/5ML IM SOSY
500.0000 mg | PREFILLED_SYRINGE | Freq: Once | INTRAMUSCULAR | Status: AC
  Administered 2021-11-10: 500 mg via INTRAMUSCULAR
  Filled 2021-11-10: qty 10

## 2021-11-10 NOTE — Progress Notes (Signed)
?[place]  ?     Telephone: [phone]?Fax: [phone]  ? ?Oncology Clinical Pharmacist Practitioner Initial Assessment ? ?Krystal [name] is a 56 y.o. female with a diagnosis of breast cancer. They were contacted today via in-person visit. ? ?Indication/Regimen ?Abemaciclib (Verzenio?) is being used appropriately for treatment of metastatic breast cancer by Dr. [name].  ?  ?  ?Wt Readings from Last 1 Encounters:  ?11/10/21 240 lb 4.8 oz (109 kg)  ? ? Estimated body surface area is 2.22 meters squared as calculated from the following: ?  Height as of this encounter: '5\' 4"'$  (1.626 m). ?  Weight as of this encounter: 240 lb 4.8 oz (109 kg). ? ?The dosing regimen is 150 mg by mouth every 12 hours on days 1 to 28 of a 28-day cycle. This is being given  in combination with fulvestrant which will be administered today . It is planned to continue until disease progression or unacceptable toxicity.  Krystal [name] was seen today by clinical pharmacy after being referred by Dr. Lindi Adie for management of her abemaciclib.  She states that she started abemaciclib 50 mg every 12 hours on March 17 and then 2 weeks later on March 31 she increased the dose to the current dose which is 100 mg every 12 hours.  Per Dr. Geralyn Flash instructions she will increase her dose again, if tolerated on April 14.  This will be the full dose at 150 mg every 12 hours. ? ?[name] says overall she is tolerating the abemaciclib well so far.  She does report some loose stools which has been managed by loperamide.  She also has ondansetron which she feels that does work well with nausea and also has prochlorperazine which she takes infrequently.  Today we discussed possible side effects of abemaciclib which include but are not limited to diarrhea, neutropenia, pneumonitis, liver toxicity, blood clots, fatigue, nausea, and vomiting.  We also went over proper storage and handling of abemaciclib and what to do should she miss a dose.   She knows to take the abemaciclib every 12 hours and at the same time every day.  We also discussed the importance of drinking plenty of none caffeinated beverages and eating a well-balanced diet. She asked very good questions today.  She inquired about her proton pump inhibitor esomeprazole and was wondering if it had any interaction with abemaciclib.  While she was in clinic, we reviewed that abemaciclib and proton pump inhibitors do not appear to have any significant drug to drug interactions.  She states that she had started famotidine but based on our conversations today she will restart her proton pump inhibitor. ? ?Dr. Lindi Adie had prescribed venlafaxine recently for Krystal [name].  Today Krystal [name] asked about potentially going up on the dose as she is currently on 37.5 mg daily.  We recommended that she continue on this dose for the next 2 weeks to see if there is any improvement, and we could consider increasing the dose at her next scheduled visit in 2 weeks.  She was agreeable to this plan.  We will plan on seeing her again with labs in 2 weeks and then again in 4 weeks when she receives her next fulvestrant injection.  She will then see Dr. Lindi Adie tentatively with labs on June 1.  We discussed with Krystal [name] that [employer] for [employer] recommend labs every 2 weeks for the first 2 months, followed by monthly labs for 2 months, and then as  clinically indicated. ?  ?Dose Modifications ?As above, currently on abemaciclib 100 mg every 12 hours which was started on March 31.  Initially started at 50 mg every 12 hours on March 17.  We will plan on increasing the dose to 150 mg every 12 hours on April 14 if tolerated. ? ?Access Assessment ?Charmian Forbis will be receiving abemaciclib through  [facility] ?Insurance Concerns: None ?Start date if known: 10/21/21 ? ?Allergies ?Allergies  ?Allergen Reactions  ? Dilaudid [Hydromorphone] Other (See Comments)  ?  RESPIRATORY DEPRESSION  ? Codeine  Nausea And Vomiting  ? ? ?Vitals ? ?  11/10/2021  ?  1:08 PM 10/28/2021  ?  2:45 PM 10/14/2021  ?  2:00 PM  ?Vitals with BMI  ?Height '5\' 4"'$     ?Weight 240 lbs 5 oz    ?BMI 41.23    ?Systolic 009 233 007  ?Diastolic 76 74 86  ?Pulse 94 89 82  ?  ? ?Laboratory Data ? ?  Latest Ref Rng & Units 11/10/2021  ? 12:47 PM 10/04/2021  ?  7:41 AM 07/06/2021  ?  8:17 AM  ?CBC EXTENDED  ?WBC 4.0 - 10.5 K/uL 3.7   2.1   2.8    ?RBC 3.87 - 5.11 MIL/uL 3.77   3.69   3.72    ?Hemoglobin 12.0 - 15.0 g/dL 12.5   12.7   12.9    ?HCT 36.0 - 46.0 % 37.1   36.3   37.5    ?Platelets 150 - 400 K/uL 194   209   214    ?NEUT# 1.7 - 7.7 K/uL 2.5   1.4   1.7    ?Lymph# 0.7 - 4.0 K/uL 0.8   0.5   0.6    ?  ? ?  Latest Ref Rng & Units 11/10/2021  ? 12:47 PM 10/04/2021  ?  7:41 AM 07/06/2021  ?  8:17 AM  ?CMP  ?Glucose 70 - 99 mg/dL 96   119   131    ?BUN 6 - 20 mg/dL '14   16   13    '$ ?Creatinine 0.44 - 1.00 mg/dL 0.98   0.85   0.63    ?Sodium 135 - 145 mmol/L 142   142   139    ?Potassium 3.5 - 5.1 mmol/L 4.2   4.3   4.1    ?Chloride 98 - 111 mmol/L 104   104   102    ?CO2 22 - 32 mmol/L '31   31   29    '$ ?Calcium 8.9 - 10.3 mg/dL 9.4   9.5   9.4    ?Total Protein 6.5 - 8.1 g/dL 7.2   7.2   7.2    ?Total Bilirubin 0.3 - 1.2 mg/dL 0.4   0.3   0.6    ?Alkaline Phos 38 - 126 U/L 81   65   54    ?AST 15 - 41 U/L 23   23   37    ?ALT 0 - 44 U/L 33   42   55    ? ?No results found for: MG  ? ?Contraindications ?Contraindications were reviewed?  Yes ?Contraindications to therapy were identified?  No ? ?Safety Precautions ?The following safety precautions for the use of abemaciclib were reviewed:  ?Diarrhea: we reviewed that diarrhea is common with abemaciclib and confirmed that she does have loperamide (Imodium) at home.  We reviewed how to take this medication PRN and gave her information on  abemaciclib ?Neutropenia: we discussed the importance of having a thermometer and what the Centers for Disease Control and Prevention (CDC) considers a fever which is 100.4?F  (38?C) or higher.  Gave patient 24/7 triage line to call if any fevers or symptoms ?ILD/Pneumonitis: we reviewed potential symptoms including cough, shortness, and fatigue. ?Hepatotoxicity: reviewed to contact clinic for RUQ pain that will not subside, yellowing of eyes/skin ?Venous thromboembolism (VTE): reviewed signs of deep vein thrombosis (DVT) such as leg swelling, redness, pain, or tenderness and signs of pulmonary embolism (PE) such as shortness of breath, rapid or irregular heartbeat, cough, chest pain, or lightheadedness ?Reviewed to take the medication every 12 hours (with food sometimes can be easier on the stomach) and to take it at the same time every day. Discussed proper storage and handling of abemaciclib ?Storage and Handling ? ?Medication Reconciliation ?Current Outpatient Medications  ?Medication Sig Dispense Refill  ? ondansetron (ZOFRAN) 8 MG tablet Take 1 tablet (8 mg total) by mouth every 8 (eight) hours as needed for nausea or vomiting. 30 tablet 2  ? prochlorperazine (COMPAZINE) 10 MG tablet Take 1 tablet (10 mg total) by mouth every 6 (six) hours as needed for nausea or vomiting. 30 tablet 2  ? abemaciclib (VERZENIO) 50 MG tablet Take 1 tablet (50 mg total) by mouth 2 (two) times daily. Swallow tablets whole. Do not chew, crush, or split tablets before swallowing. 60 tablet 0  ? calcium-vitamin D (OSCAL WITH D) 500-200 MG-UNIT tablet Take 1 tablet by mouth 2 (two) times daily. 180 tablet 3  ? esomeprazole (NEXIUM) 40 MG capsule Take 1 capsule by mouth daily.    ? loperamide (IMODIUM) 2 MG capsule Take 2 mg by mouth as needed for diarrhea or loose stools. Take 2 tablets at first diarrhea and then 1 tablet after additional loose stool. Max number in 24 hours is 8 tablets.    ? loratadine (CLARITIN) 10 MG tablet Take 1 tablet by mouth daily.    ? meloxicam (MOBIC) 7.5 MG tablet TAKE 1 TABLET(7.5 MG) BY MOUTH DAILY AS NEEDED FOR PAIN 30 tablet 6  ? traZODone (DESYREL) 50 MG tablet TAKE 1  TABLET(50 MG) BY MOUTH AT BEDTIME 30 tablet 3  ? venlafaxine XR (EFFEXOR-XR) 37.5 MG 24 hr capsule Take 1 capsule (37.5 mg total) by mouth daily with breakfast. 30 capsule 0  ? ?No current facility-administer

## 2021-11-10 NOTE — Patient Instructions (Signed)
Fulvestrant injection °What is this medication? °FULVESTRANT (ful VES trant) blocks the effects of estrogen. It is used to treat breast cancer. °This medicine may be used for other purposes; ask your health care provider or pharmacist if you have questions. °COMMON BRAND NAME(S): FASLODEX °What should I tell my care team before I take this medication? °They need to know if you have any of these conditions: °bleeding disorders °liver disease °low blood counts, like low white cell, platelet, or red cell counts °an unusual or allergic reaction to fulvestrant, other medicines, foods, dyes, or preservatives °pregnant or trying to get pregnant °breast-feeding °How should I use this medication? °This medicine is for injection into a muscle. It is usually given by a health care professional in a hospital or clinic setting. °Talk to your pediatrician regarding the use of this medicine in children. Special care may be needed. °Overdosage: If you think you have taken too much of this medicine contact a poison control center or emergency room at once. °NOTE: This medicine is only for you. Do not share this medicine with others. °What if I miss a dose? °It is important not to miss your dose. Call your doctor or health care professional if you are unable to keep an appointment. °What may interact with this medication? °medicines that treat or prevent blood clots like warfarin, enoxaparin, dalteparin, apixaban, dabigatran, and rivaroxaban °This list may not describe all possible interactions. Give your health care provider a list of all the medicines, herbs, non-prescription drugs, or dietary supplements you use. Also tell them if you smoke, drink alcohol, or use illegal drugs. Some items may interact with your medicine. °What should I watch for while using this medication? °Your condition will be monitored carefully while you are receiving this medicine. You will need important blood work done while you are taking this  medicine. °Do not become pregnant while taking this medicine or for at least 1 year after stopping it. Women of child-bearing potential will need to have a negative pregnancy test before starting this medicine. Women should inform their doctor if they wish to become pregnant or think they might be pregnant. There is a potential for serious side effects to an unborn child. Men should inform their doctors if they wish to father a child. This medicine may lower sperm counts. Talk to your health care professional or pharmacist for more information. Do not breast-feed an infant while taking this medicine or for 1 year after the last dose. °What side effects may I notice from receiving this medication? °Side effects that you should report to your doctor or health care professional as soon as possible: °allergic reactions like skin rash, itching or hives, swelling of the face, lips, or tongue °feeling faint or lightheaded, falls °pain, tingling, numbness, or weakness in the legs °signs and symptoms of infection like fever or chills; cough; flu-like symptoms; sore throat °vaginal bleeding °Side effects that usually do not require medical attention (report to your doctor or health care professional if they continue or are bothersome): °aches, pains °constipation °diarrhea °headache °hot flashes °nausea, vomiting °pain at site where injected °stomach pain °This list may not describe all possible side effects. Call your doctor for medical advice about side effects. You may report side effects to FDA at 1-800-FDA-1088. °Where should I keep my medication? °This drug is given in a hospital or clinic and will not be stored at home. °NOTE: This sheet is a summary. It may not cover all possible information. If you have   questions about this medicine, talk to your doctor, pharmacist, or health care provider. °© 2022 [employer] (2017-11-06 00:00:00) ° °

## 2021-11-11 ENCOUNTER — Telehealth: Payer: Self-pay | Admitting: Pharmacist

## 2021-11-11 LAB — CANCER ANTIGEN 27.29: CA 27.29: 15.7 U/mL (ref 0.0–38.6)

## 2021-11-11 NOTE — Telephone Encounter (Signed)
Scheduled appointment per 04/06 los. Left message.   ?

## 2021-11-15 ENCOUNTER — Other Ambulatory Visit: Payer: Self-pay | Admitting: Pharmacist

## 2021-11-15 MED ORDER — ABEMACICLIB 100 MG PO TABS: 100.0000 mg | ORAL_TABLET | Freq: Two times a day (BID) | ORAL | 0 refills | Status: DC

## 2021-11-15 NOTE — Progress Notes (Signed)
[place]  ?     Telephone: [phone]?Fax: [phone]  ? ?Oncology Clinical Pharmacist Practitioner Progress Note ? ?Krystal [name] is a 56 y.o. female with a diagnosis of metastatic breast cancer currently on abemaciclib with fulvestrant under the care of Dr. [name].  ? ?Krystal [name] was contacted today via telephone after clinical pharmacy received a message regarding her refill for abemaciclib.  It appears [name] had some questions regarding refill status.  Krystal [name] states that she is tolerating the abemaciclib 100 mg every 12 hours but is continuing to have some GI upset including nausea but no vomiting.  The anti-nausea medications are effective per her report. She has about 3 days left of medication per her estimation.  We discussed going up to the 150 mg every 12 hour dose, but after careful consideration, Krystal [name] would prefer to stay at the 100 mg every 12 hour dose for now.  A new prescription has been sent to [facility] for this dose using the 100 mg tablets.  We discussed that if she does tolerate the 100 mg every 12 hour dose that she can always go up to the 150 mg every 12 hour dose at a later time.  She verbalized understanding of this plan and is in agreement. ? ?She will have labs and see clinical pharmacy on April 20.  She knows to contact the clinic with any questions or concerns in the interim. ? ?Clinical pharmacy will continue to support Krystal [name] and Dr. [name] as needed. ? ?[name], RPH-CPP,  ?11/15/2021  12:50 PM  ? ?**Disclaimer: This note was dictated with voice recognition software. Similar sounding words can inadvertently be transcribed and this note may contain transcription errors which may not have been corrected upon publication of note.** ?

## 2021-11-22 ENCOUNTER — Other Ambulatory Visit: Payer: Self-pay | Admitting: *Deleted

## 2021-11-22 DIAGNOSIS — C50919 Malignant neoplasm of unspecified site of unspecified female breast: Secondary | ICD-10-CM

## 2021-11-24 ENCOUNTER — Inpatient Hospital Stay: Payer: Commercial Managed Care - PPO | Admitting: Pharmacist

## 2021-11-24 ENCOUNTER — Telehealth: Payer: Self-pay | Admitting: Pharmacist

## 2021-11-24 ENCOUNTER — Inpatient Hospital Stay: Payer: Commercial Managed Care - PPO

## 2021-11-24 ENCOUNTER — Other Ambulatory Visit: Payer: Self-pay

## 2021-11-24 VITALS — BP 139/82 | HR 93 | Temp 97.9°F | Wt 236.8 lb

## 2021-11-24 DIAGNOSIS — Z170 Estrogen receptor positive status [ER+]: Secondary | ICD-10-CM

## 2021-11-24 DIAGNOSIS — C7951 Secondary malignant neoplasm of bone: Secondary | ICD-10-CM

## 2021-11-24 DIAGNOSIS — C50919 Malignant neoplasm of unspecified site of unspecified female breast: Secondary | ICD-10-CM

## 2021-11-24 DIAGNOSIS — Z5111 Encounter for antineoplastic chemotherapy: Secondary | ICD-10-CM | POA: Diagnosis not present

## 2021-11-24 LAB — CMP (CANCER CENTER ONLY)
ALT: 33 U/L (ref 0–44)
AST: 24 U/L (ref 15–41)
Albumin: 4.2 g/dL (ref 3.5–5.0)
Alkaline Phosphatase: 76 U/L (ref 38–126)
Anion gap: 6 (ref 5–15)
BUN: 12 mg/dL (ref 6–20)
CO2: 31 mmol/L (ref 22–32)
Calcium: 9.2 mg/dL (ref 8.9–10.3)
Chloride: 106 mmol/L (ref 98–111)
Creatinine: 0.93 mg/dL (ref 0.44–1.00)
GFR, Estimated: >60 mL/min (ref >60)
Glucose, Bld: 146 mg/dL — ABNORMAL HIGH (ref 70–99)
Potassium: 4 mmol/L (ref 3.5–5.1)
Sodium: 143 mmol/L (ref 135–145)
Total Bilirubin: 0.3 mg/dL (ref 0.3–1.2)
Total Protein: 7.2 g/dL (ref 6.5–8.1)

## 2021-11-24 LAB — CBC WITH DIFFERENTIAL (CANCER CENTER ONLY)
Abs Immature Granulocytes: 0.01 10*3/uL (ref 0.00–0.07)
Basophils Absolute: 0.1 10*3/uL (ref 0.0–0.1)
Basophils Relative: 2 %
Eosinophils Absolute: 0.2 10*3/uL (ref 0.0–0.5)
Eosinophils Relative: 6 %
HCT: 35.7 % — ABNORMAL LOW (ref 36.0–46.0)
Hemoglobin: 12.3 g/dL (ref 12.0–15.0)
Immature Granulocytes: 0 %
Lymphocytes Relative: 31 %
Lymphs Abs: 0.9 10*3/uL (ref 0.7–4.0)
MCH: 33.7 pg (ref 26.0–34.0)
MCHC: 34.5 g/dL (ref 30.0–36.0)
MCV: 97.8 fL (ref 80.0–100.0)
Monocytes Absolute: 0.2 10*3/uL (ref 0.1–1.0)
Monocytes Relative: 7 %
Neutro Abs: 1.6 10*3/uL — ABNORMAL LOW (ref 1.7–7.7)
Neutrophils Relative %: 54 %
Platelet Count: 212 10*3/uL (ref 150–400)
RBC: 3.65 MIL/uL — ABNORMAL LOW (ref 3.87–5.11)
RDW: 13 % (ref 11.5–15.5)
WBC Count: 3 10*3/uL — ABNORMAL LOW (ref 4.0–10.5)
nRBC: 0 % (ref 0.0–0.2)

## 2021-11-24 MED ORDER — ABEMACICLIB 100 MG PO TABS: 100.0000 mg | ORAL_TABLET | Freq: Two times a day (BID) | ORAL | 11 refills | Status: DC

## 2021-11-24 NOTE — Telephone Encounter (Signed)
[place]  ?     Telephone: [phone]?Fax: [phone]  ? ?Oncology Clinical Pharmacist Practitioner Progress Note ? ?Krystal [name] is a 56 y.o. female with a diagnosis of metastatic breast cancer currently on abemaciclib/fulvestrant under the care of Dr. [name].  ? ?Clinical pharmacy received a fax from Dr. Geralyn Flash nurse yesterday.  [facility], Krystal [name]'s specialty pharmacy, had sent in a fax inquiring about her abemaciclib prescription.  We had recently sent a new abemaciclib prescription to [facility] on 11/15/21 for 100 mg every 12 hours (#56, 28-day supply).  We contacted [facility] today to clarify the correspondence.  We spoke to patient care advocate Krystal [name] who explained that [facility] may had been inquiring about refills on the prescription that was sent.  Clinical pharmacy discussed that the fax sent by [facility] had a quantity of 12 which would be incorrect.  We explained that we are continuing to monitor Krystal [name]'s [name] effects on the abemaciclib and may go up on the dose in the future.  We are seeing Krystal [name] today to discuss options.  Clinical pharmacy/Dr. Lindi Adie will contact [facility] with a new prescription for abemaciclib if applicable. ? ?Clinical pharmacy will continue to support Krystal [name] and Dr. [name] as needed. ? ?Krystal [name], RPH-CPP,  ?11/24/2021  8:19 AM  ? ?**Disclaimer: This note was dictated with voice recognition software. Similar sounding words can inadvertently be transcribed and this note may contain transcription errors which may not have been corrected upon publication of note.** ? ?

## 2021-11-24 NOTE — Progress Notes (Signed)
[place]  ?     Telephone: [phone]?Fax: [phone]  ? ?Oncology Clinical Pharmacist Practitioner Progress Note ? ?Krystal [name] was contacted via in-person to discuss her chemotherapy regimen for abemaciclib which they receive under the care of Dr. [name]. ? ?Current treatment regimen and start date ?Abemaciclib (10/21/21) ?Fulvestrant (10/14/21) ? ?Interval History ?She continues on abemaciclib 100 mg by mouth every 12 hours on days 1 to 28 of a 28-day cycle. This is being given  in combination with fulvestrant which is due next on 12/08/21 . Therapy is planned to continue until disease progression or unacceptable toxicity.  ? ?Response to Therapy ?Krystal [name] was seen today by clinical pharmacy as a follow-up to her abemaciclib medication.  She continues on 100 mg every 12 hours and states today that she is comfortable continuing at this dose for now.  She is not interested in going up on the dose.  A new prescription with refills has been sent to [facility] with a start date of 12/16/21. She is having some nausea that she is taking ondansetron for with relief.  She has been under a lot of stress lately at work and at home and feels that her nausea and loose stools could be attributed to her increase in stress.  She has been taking more loperamide as of late for loose stools.  We discussed potentially going to a prescription medication such as diphenoxylate/atropine.  She will continue to monitor the GI toxicities and contact the clinic if she is interested in pursuing this agent.  She is next due for fulvestrant on 12/08/21 but she prefers to have this done the following week since [birth year] her [birth year].  This appointment has been moved per her request and she will have labs and see Dr. Lindi Adie on 12/15/21.  She will then have labs, see clinical pharmacy, and have fulvestrant again on 01/12/22.  At that point if she is continuing to tolerate abemaciclib, we may consider changing her frequency of  labs to every other month.   ? ?Today we also discussed today increasing her venlafaxine dose to 75 mg.  At this point, she would rather wait and see how she feels when she sees Dr. Lindi Adie next.  She has restarted the esomeprazole after clinical pharmacy finding no significant interactions with abemaciclib.  She states that this is working very well and she is only needing to take it as needed for her acid reflux.  Her ANC today is estimated at 1600 cells/?L, down from 2500 cells/?L at her last visit.  We will continue to monitor this value. Labs, vitals, treatment parameters, and manufacturer guidelines assessing toxicity were reviewed with Krystal [name] today. Based on these values, patient is in agreement to continue abemaciclib therapy at this time. ? ?Allergies ?Allergies  ?Allergen Reactions  ? Dilaudid [Hydromorphone] Other (See Comments)  ?  RESPIRATORY DEPRESSION  ? Codeine Nausea And Vomiting  ? ? ?Vitals ? ?  11/10/2021  ?  1:08 PM 10/28/2021  ?  2:45 PM 10/14/2021  ?  2:00 PM  ?Vitals with BMI  ?Height '5\' 4"'$     ?Weight 240 lbs 5 oz    ?BMI 41.23    ?Systolic 952 841 324  ?Diastolic 76 74 86  ?Pulse 94 89 82  ?  ? ?Laboratory Data ? ?  Latest Ref Rng & Units 11/10/2021  ? 12:47 PM 10/04/2021  ?  7:41 AM 07/06/2021  ?  8:17 AM  ?CBC EXTENDED  ?  WBC 4.0 - 10.5 K/uL 3.7   2.1   2.8    ?RBC 3.87 - 5.11 MIL/uL 3.77   3.69   3.72    ?Hemoglobin 12.0 - 15.0 g/dL 12.5   12.7   12.9    ?HCT 36.0 - 46.0 % 37.1   36.3   37.5    ?Platelets 150 - 400 K/uL 194   209   214    ?NEUT# 1.7 - 7.7 K/uL 2.5   1.4   1.7    ?Lymph# 0.7 - 4.0 K/uL 0.8   0.5   0.6    ?  ? ?  Latest Ref Rng & Units 11/10/2021  ? 12:47 PM 10/04/2021  ?  7:41 AM 07/06/2021  ?  8:17 AM  ?CMP  ?Glucose 70 - 99 mg/dL 96   119   131    ?BUN 6 - 20 mg/dL '14   16   13    '$ ?Creatinine 0.44 - 1.00 mg/dL 0.98   0.85   0.63    ?Sodium 135 - 145 mmol/L 142   142   139    ?Potassium 3.5 - 5.1 mmol/L 4.2   4.3   4.1    ?Chloride 98 - 111 mmol/L 104   104   102    ?CO2 22 -  32 mmol/L '31   31   29    '$ ?Calcium 8.9 - 10.3 mg/dL 9.4   9.5   9.4    ?Total Protein 6.5 - 8.1 g/dL 7.2   7.2   7.2    ?Total Bilirubin 0.3 - 1.2 mg/dL 0.4   0.3   0.6    ?Alkaline Phos 38 - 126 U/L 81   65   54    ?AST 15 - 41 U/L 23   23   37    ?ALT 0 - 44 U/L 33   42   55    ?  ?No results found for: MG ? ?Adverse Effects Assessment ?Nausea: slightly increased which may be due to stress per her report. Controlled by ondansetron. She also has prochlorperazine if needed ?Diarrhea: increased as of late. Could be from increased stress per her report. Has taken 8 tabs of loperamide over the last 2 days. Discussed diphenoxylate/atropine today and she will consider this agent should symptoms worsen ?Continue to watch Krystal [name] and hold if < 1000 cells/uL per manufacturing guidelines ? ?Adherence Assessment ?Krystal [name] reports missing 0 doses over the past 2 weeks.   ?Reason for missed dose: n/a ?Patient was re-educated on importance of adherence.  ? ?Access Assessment ?Krystal [name] is currently receiving her abemaciclib through  [facility]   ?Insurance concerns:  none ? ?Medication Reconciliation ?The patient's medication list was reviewed today with the patient? Yes ?New medications or herbal supplements have recently been started? No  ?Any medications have been discontinued? No  ?The medication list was updated and reconciled based on the patient's most recent medication list in the electronic medical record (EMR) including herbal products and OTC medications.  ? ?Medications ?Current Outpatient Medications  ?Medication Sig Dispense Refill  ? abemaciclib (VERZENIO) 100 MG tablet Take 1 tablet (100 mg total) by mouth 2 (two) times daily. Swallow tablets whole. Do not chew, crush, or split tablets before swallowing. 56 tablet 0  ? calcium-vitamin D (OSCAL WITH D) 500-200 MG-UNIT tablet Take 1 tablet by mouth 2 (two) times daily. 180 tablet 3  ? esomeprazole (NEXIUM) 40 MG capsule Take 1  capsule by mouth  daily.    ? loperamide (IMODIUM) 2 MG capsule Take 2 mg by mouth as needed for diarrhea or loose stools. Take 2 tablets at first diarrhea and then 1 tablet after additional loose stool. Max number in 24 hours is 8 tablets.    ? loratadine (CLARITIN) 10 MG tablet Take 1 tablet by mouth daily.    ? meloxicam (MOBIC) 7.5 MG tablet TAKE 1 TABLET(7.5 MG) BY MOUTH DAILY AS NEEDED FOR PAIN 30 tablet 6  ? ondansetron (ZOFRAN) 8 MG tablet Take 1 tablet (8 mg total) by mouth every 8 (eight) hours as needed for nausea or vomiting. 30 tablet 2  ? prochlorperazine (COMPAZINE) 10 MG tablet Take 1 tablet (10 mg total) by mouth every 6 (six) hours as needed for nausea or vomiting. 30 tablet 2  ? traZODone (DESYREL) 50 MG tablet TAKE 1 TABLET(50 MG) BY MOUTH AT BEDTIME 30 tablet 3  ? venlafaxine XR (EFFEXOR-XR) 37.5 MG 24 hr capsule Take 1 capsule (37.5 mg total) by mouth daily with breakfast. 30 capsule 0  ? ?No current facility-administered medications for this visit.  ? ? ?Drug-Drug Interactions (DDIs) ?DDIs were evaluated? Yes ?Significant DDIs?  No, possible increase risk of serotonin syndrome with trazodone and venlafaxine. Reviewed signs and symptoms and provided written information on the condition ?The patient was instructed to speak with their health care provider and/or the oral chemotherapy pharmacist before starting any new drug, including prescription or over the counter, natural / herbal products, or vitamins. ? ?Supportive Care ?Diarrhea: we reviewed that diarrhea is common with abemaciclib and confirmed that she does have loperamide (Imodium) at home.  We reviewed how to take this medication PRN ?Neutropenia: we discussed the importance of having a thermometer and what the Centers for Disease Control and Prevention (CDC) considers a fever which is 100.4?F (38?C) or higher.  Gave patient 24/7 triage line to call if any fevers or symptoms ?ILD/Pneumonitis: we reviewed potential symptoms including cough, shortness, and  fatigue.  ?Hepatotoxicity: WNL ?VTE: reviewed signs of DVT such as leg swelling, redness, pain, or tenderness and signs of PE such as shortness of breath, rapid or irregular heartbeat, cough, chest pain

## 2021-11-25 LAB — CANCER ANTIGEN 27.29: CA 27.29: 16.7 U/mL (ref 0.0–38.6)

## 2021-11-28 ENCOUNTER — Other Ambulatory Visit: Payer: Self-pay | Admitting: Hematology and Oncology

## 2021-11-30 ENCOUNTER — Other Ambulatory Visit: Payer: Self-pay | Admitting: Pharmacist

## 2021-11-30 MED ORDER — TRAZODONE HCL 50 MG PO TABS
ORAL_TABLET | ORAL | 11 refills | Status: DC
Start: 2021-11-30 — End: 2022-11-30

## 2021-12-01 NOTE — Progress Notes (Signed)
? ?Patient Care Team: ?Patient, [name] (Inactive) as PCP - General (General Practice) ?[name], MD as Medical Oncologist (Hematology and Oncology) ?[name], MD as Consulting Physician (Radiation Oncology) ?Gardenia Phlegm, NP as Nurse Practitioner (Hematology and Oncology) ?Rolm Bookbinder, MD as Consulting Physician (General Surgery) ?[name], RPH-CPP as Pharmacist (Hematology and Oncology) ? ?DIAGNOSIS:  ?Encounter Diagnosis  ?Name Primary?  ? Malignant neoplasm of central portion of left breast in female, estrogen receptor positive ([place]) Yes  ? ? ?SUMMARY OF ONCOLOGIC HISTORY: ?Oncology History  ?Cancer of central portion of left female breast [facility])  ?04/02/2014 Genetic Testing  ? BRCA1 [id number] pathogenic mutation identified on gene sequce and del/dup analyses of BRCA1 and BRCA2.  BRCA1 p.[id number] VUS identified as well.  The report date is 04/02/2014.  UPDATE: BRCA1 V.H8469G VUS has been reclassified as a likely benign variant.  The reclassification date is 04/21/2020. ?  ?04/16/2014 Surgery  ? Bilateral mastectomies: Left breast : Multifocal invasive ductal carcinoma positive for lymphovascular invasion, 1.2 cm and 0.6 cm, grade 3, high-grade DCIS with comedonecrosis, 4 SLN negative T1 C. N0 M0 stage IA: Oncotype DX 13 (ROR 9%) ? ?  ?04/16/2014 Surgery  ? Left upper back melanoma resected no residual melanoma identified on re-resection margins negative ? ?  ?05/26/2014 - 06/08/2015 Anti-estrogen oral therapy  ? Tamoxifen 20 mg daily with a plan to switch her to aromatase inhibitors once she gets oophorectomy ( patient did not continue antiestrogen therapy by choice) ? ?  ?08/13/2014 Surgery  ? oophorectomy with total abdominal hysterectomy ? ?  ?09/08/2015 -  Anti-estrogen oral therapy  ? Resumed anastrozole after she found recurrence of breast cancer; stopped in January 2018, started letrozole 2.5 mg daily 10/12/2016; switched to Exemestane 25 mg daily on 01/03/17 ?  ?09/16/2015  Surgery  ? Skin, left: IDC grade 3; 0.5 cm, margins negative, ER 95%, PR 10%, HER-2 positive ratio 2.08 ? ?  ?11/08/2015 - 02/2017 Chemotherapy  ? Herceptin every 3 weeks plus anastrozole ?  ?11/24/2015 - 01/14/2016 Radiation Therapy  ? Adjuvant radiation therapy by Dr. Sondra Come ? ?  ?01/2017 -  Anti-estrogen oral therapy  ? Exemestane daily (stopped others due to side effects), due to muscle aches and pains switch to tamoxifen 5 mg daily starting 12/18/2017 ? ?  ?08/07/2019 Relapse/Recurrence  ? Bone scan: Sclerotic metastatic lesions involving left femoral neck, right ninth rib and entire L5 vertebra; ?MRI left hip: Diffuse signal abnormality L5 vertebral body with possible extraosseous extension ?  ?08/22/2019 - 09/03/2019 Radiation Therapy  ? Palliative radiation with Dr. Sondra Come to L5 and left proixmal femur.  ?  ? ? ?CHIEF COMPLIANT: Follow-up of metastatic breast cancer on Verzenio ? ?INTERVAL HISTORY: Krystal [name] is a 56 y.o. with above-mentioned history of metastatic breast cancer with metastases  osseous . She reports to the clinic today for follow-up. She complains of urine odor. She state she had some diarrhea. She complains of stomach cramps for over the last month lasted about a half of day. ? ? ?ALLERGIES:  is allergic to dilaudid [hydromorphone] and codeine. ? ?MEDICATIONS:  ?Current Outpatient Medications  ?Medication Sig Dispense Refill  ? [START ON 12/16/2021] abemaciclib (VERZENIO) 100 MG tablet Take 1 tablet (100 mg total) by mouth 2 (two) times daily. Swallow tablets whole. Do not chew, crush, or split tablets before swallowing. 56 tablet 11  ? aspirin-acetaminophen-caffeine (EXCEDRIN MIGRAINE) 250-250-65 MG tablet Take 2 tablets by mouth every 6 (six) hours as needed for headache.    ?  calcium-vitamin D (OSCAL WITH D) 500-200 MG-UNIT tablet Take 1 tablet by mouth 2 (two) times daily. 180 tablet 3  ? esomeprazole (NEXIUM) 40 MG capsule Take 1 capsule by mouth daily.    ? loperamide (IMODIUM) 2 MG capsule  Take 2 mg by mouth as needed for diarrhea or loose stools. Take 2 tablets at first diarrhea and then 1 tablet after additional loose stool. Max number in 24 hours is 8 tablets.    ? loratadine (CLARITIN) 10 MG tablet Take by mouth.    ? meloxicam (MOBIC) 7.5 MG tablet TAKE 1 TABLET(7.5 MG) BY MOUTH DAILY AS NEEDED FOR PAIN 30 tablet 6  ? ondansetron (ZOFRAN) 8 MG tablet Take 1 tablet (8 mg total) by mouth every 8 (eight) hours as needed for nausea or vomiting. 30 tablet 10  ? prochlorperazine (COMPAZINE) 10 MG tablet Take 1 tablet (10 mg total) by mouth every 6 (six) hours as needed for nausea or vomiting. 30 tablet 2  ? traZODone (DESYREL) 50 MG tablet TAKE 1 TABLET(50 MG) BY MOUTH AT BEDTIME 30 tablet 11  ? venlafaxine XR (EFFEXOR-XR) 75 MG 24 hr capsule Take 1 capsule (75 mg total) by mouth at bedtime. 30 capsule 6  ? ?No current facility-administered medications for this visit.  ? ? ?PHYSICAL EXAMINATION: ?ECOG PERFORMANCE STATUS: 1 - Symptomatic but completely ambulatory ? ?Vitals:  ? 12/15/21 1409  ?BP: 135/71  ?Pulse: 90  ?Resp: 18  ?Temp: (!) 97.2 ?F (36.2 ?C)  ?SpO2: 97%  ? ?Filed Weights  ? 12/15/21 1409  ?Weight: 236 lb (107 kg)  ? ?  ? ?LABORATORY DATA:  ?I have reviewed the data as listed ? ?  Latest Ref Rng & Units 12/15/2021  ?  1:53 PM 11/24/2021  ?  2:40 PM 11/10/2021  ? 12:47 PM  ?CMP  ?Glucose 70 - 99 mg/dL 112   146   96    ?BUN 6 - 20 mg/dL 10   12   14     ?Creatinine 0.44 - 1.00 mg/dL 0.78   0.93   0.98    ?Sodium 135 - 145 mmol/L 140   143   142    ?Potassium 3.5 - 5.1 mmol/L 4.0   4.0   4.2    ?Chloride 98 - 111 mmol/L 103   106   104    ?CO2 22 - 32 mmol/L 31   31   31     ?Calcium 8.9 - 10.3 mg/dL 9.6   9.2   9.4    ?Total Protein 6.5 - 8.1 g/dL 7.5   7.2   7.2    ?Total Bilirubin 0.3 - 1.2 mg/dL 0.4   0.3   0.4    ?Alkaline Phos 38 - 126 U/L 65   76   81    ?AST 15 - 41 U/L 22   24   23     ?ALT 0 - 44 U/L 31   33   33    ? ? ?Lab Results  ?Component Value Date  ? WBC 3.1 (L) 12/15/2021  ? HGB  12.6 12/15/2021  ? HCT 36.1 12/15/2021  ? MCV 97.6 12/15/2021  ? PLT 241 12/15/2021  ? NEUTROABS 1.9 12/15/2021  ? ? ?ASSESSMENT & PLAN:  ?Cancer of central portion of left female breast ([place]) ?Bone scan 08/07/2019: Increase activity with sclerotic bone metastases involving left femoral neck right ninth rib and entire L5 vertebra ?  ?08/22/2019: PET CT scan: Metastatic disease in the chest with  multiple lung nodules all subcentimeter size and hypermetabolic lymph nodes (right paratracheal node 7 mm, subcarinal node 1 cm, right hilar node 6 mm), retroperitoneal and upper pelvic common iliac lymph nodes, bone metastases especially L5, L4, T6, right 10th rib, left proximal femur ?  ?Bone biopsy was not felt to be reliable in addition to the effect of radiation on the bone. ?Palliative radiation completed 09/03/2019 ?Guardant 360: BRCA1 mutation, TMB 5.36, MSI: Normal ?  ?Treatment plan: Ibrance with letrozole and Xgeva for bone metastases switched to abemaciclib ? ?Bone metastasis: Patient does not want to take bisphosphonates.  ?  ?CT CAP 09/23/20: Slight int enl of lingular and LLL lung nodules, Stable bone mets ?CT CAP 04/04/2021: Increase in size of lymph nodes subcarinal lymph node 1.2 cm (previously 6 mm) right lower paratracheal lymph node 6 mm, interval enlargement of lingular 9 mm (previously 5 mm) and left lower lobe lung nodules 6 mm previously 4 mm, stable bone metastases right lateral rib, L5, S1  ?CT CAP 10/05/2021: Progressive sclerotic bone metastases, stable mediastinal and hilar nodes, increase in size of lingular and left lower lobe lung nodules ?  ?Abemaciclib toxicities: ?1.  Intermittent diarrhea ?2. mild fatigue ? ?Return to clinic in 1 month with scans and follow-up ? ? ? ? ?Orders Placed This Encounter  ?Procedures  ? CT CHEST ABDOMEN PELVIS W CONTRAST  ?  Standing Status:   Future  ?  Standing Expiration Date:   12/16/2022  ?  Order Specific Question:   Is patient pregnant?  ?  Answer:   No  ?  Order  Specific Question:   Preferred imaging location?  ?  Answer:   [facility]  ?  Order Specific Question:   Is Oral Contrast requested for this exam?  ?  Answer:   Yes, Per Radiology protocol  ?

## 2021-12-05 ENCOUNTER — Other Ambulatory Visit: Payer: Self-pay | Admitting: Hematology and Oncology

## 2021-12-08 ENCOUNTER — Inpatient Hospital Stay: Payer: Commercial Managed Care - PPO

## 2021-12-08 ENCOUNTER — Inpatient Hospital Stay: Payer: Commercial Managed Care - PPO | Admitting: Pharmacist

## 2021-12-09 ENCOUNTER — Ambulatory Visit: Payer: Commercial Managed Care - PPO

## 2021-12-12 ENCOUNTER — Ambulatory Visit: Payer: Commercial Managed Care - PPO

## 2021-12-15 ENCOUNTER — Inpatient Hospital Stay: Payer: Commercial Managed Care - PPO

## 2021-12-15 ENCOUNTER — Inpatient Hospital Stay: Payer: Commercial Managed Care - PPO | Admitting: Hematology and Oncology

## 2021-12-15 ENCOUNTER — Other Ambulatory Visit: Payer: Self-pay

## 2021-12-15 ENCOUNTER — Inpatient Hospital Stay: Payer: Commercial Managed Care - PPO | Attending: Hematology and Oncology

## 2021-12-15 VITALS — BP 135/71 | HR 90 | Temp 97.2°F | Resp 18 | Ht 64.0 in | Wt 236.0 lb

## 2021-12-15 DIAGNOSIS — C50919 Malignant neoplasm of unspecified site of unspecified female breast: Secondary | ICD-10-CM

## 2021-12-15 DIAGNOSIS — C7951 Secondary malignant neoplasm of bone: Secondary | ICD-10-CM | POA: Insufficient documentation

## 2021-12-15 DIAGNOSIS — R5383 Other fatigue: Secondary | ICD-10-CM | POA: Insufficient documentation

## 2021-12-15 DIAGNOSIS — Z79899 Other long term (current) drug therapy: Secondary | ICD-10-CM | POA: Diagnosis not present

## 2021-12-15 DIAGNOSIS — Z79818 Long term (current) use of other agents affecting estrogen receptors and estrogen levels: Secondary | ICD-10-CM | POA: Insufficient documentation

## 2021-12-15 DIAGNOSIS — R197 Diarrhea, unspecified: Secondary | ICD-10-CM | POA: Diagnosis not present

## 2021-12-15 DIAGNOSIS — C50112 Malignant neoplasm of central portion of left female breast: Secondary | ICD-10-CM

## 2021-12-15 DIAGNOSIS — Z79811 Long term (current) use of aromatase inhibitors: Secondary | ICD-10-CM | POA: Insufficient documentation

## 2021-12-15 DIAGNOSIS — Z9013 Acquired absence of bilateral breasts and nipples: Secondary | ICD-10-CM | POA: Insufficient documentation

## 2021-12-15 DIAGNOSIS — Z9221 Personal history of antineoplastic chemotherapy: Secondary | ICD-10-CM | POA: Diagnosis not present

## 2021-12-15 DIAGNOSIS — Z170 Estrogen receptor positive status [ER+]: Secondary | ICD-10-CM | POA: Insufficient documentation

## 2021-12-15 DIAGNOSIS — Z923 Personal history of irradiation: Secondary | ICD-10-CM | POA: Insufficient documentation

## 2021-12-15 LAB — CBC WITH DIFFERENTIAL (CANCER CENTER ONLY)
Abs Immature Granulocytes: 0.01 10*3/uL (ref 0.00–0.07)
Basophils Absolute: 0 10*3/uL (ref 0.0–0.1)
Basophils Relative: 1 %
Eosinophils Absolute: 0.1 10*3/uL (ref 0.0–0.5)
Eosinophils Relative: 4 %
HCT: 36.1 % (ref 36.0–46.0)
Hemoglobin: 12.6 g/dL (ref 12.0–15.0)
Immature Granulocytes: 0 %
Lymphocytes Relative: 27 %
Lymphs Abs: 0.8 10*3/uL (ref 0.7–4.0)
MCH: 34.1 pg — ABNORMAL HIGH (ref 26.0–34.0)
MCHC: 34.9 g/dL (ref 30.0–36.0)
MCV: 97.6 fL (ref 80.0–100.0)
Monocytes Absolute: 0.2 10*3/uL (ref 0.1–1.0)
Monocytes Relative: 7 %
Neutro Abs: 1.9 10*3/uL (ref 1.7–7.7)
Neutrophils Relative %: 61 %
Platelet Count: 241 10*3/uL (ref 150–400)
RBC: 3.7 MIL/uL — ABNORMAL LOW (ref 3.87–5.11)
RDW: 13.1 % (ref 11.5–15.5)
WBC Count: 3.1 10*3/uL — ABNORMAL LOW (ref 4.0–10.5)
nRBC: 0 % (ref 0.0–0.2)

## 2021-12-15 LAB — CMP (CANCER CENTER ONLY)
ALT: 31 U/L (ref 0–44)
AST: 22 U/L (ref 15–41)
Albumin: 4.3 g/dL (ref 3.5–5.0)
Alkaline Phosphatase: 65 U/L (ref 38–126)
Anion gap: 6 (ref 5–15)
BUN: 10 mg/dL (ref 6–20)
CO2: 31 mmol/L (ref 22–32)
Calcium: 9.6 mg/dL (ref 8.9–10.3)
Chloride: 103 mmol/L (ref 98–111)
Creatinine: 0.78 mg/dL (ref 0.44–1.00)
GFR, Estimated: >60 mL/min (ref >60)
Glucose, Bld: 112 mg/dL — ABNORMAL HIGH (ref 70–99)
Potassium: 4 mmol/L (ref 3.5–5.1)
Sodium: 140 mmol/L (ref 135–145)
Total Bilirubin: 0.4 mg/dL (ref 0.3–1.2)
Total Protein: 7.5 g/dL (ref 6.5–8.1)

## 2021-12-15 MED ORDER — ONDANSETRON HCL 8 MG PO TABS
8.0000 mg | ORAL_TABLET | Freq: Three times a day (TID) | ORAL | 10 refills | Status: DC | PRN
Start: 2021-12-15 — End: 2022-10-05

## 2021-12-15 MED ORDER — VENLAFAXINE HCL ER 75 MG PO CP24: 75.0000 mg | ORAL_CAPSULE | Freq: Every day | ORAL | 6 refills | Status: DC

## 2021-12-15 MED ORDER — FULVESTRANT 250 MG/5ML IM SOSY
500.0000 mg | PREFILLED_SYRINGE | Freq: Once | INTRAMUSCULAR | Status: AC
  Administered 2021-12-15: 500 mg via INTRAMUSCULAR
  Filled 2021-12-15: qty 10

## 2021-12-15 NOTE — Assessment & Plan Note (Addendum)
Bone scan 08/07/2019: Increase activity with sclerotic bone metastases involving left femoral neck right ninth rib and entire L5 vertebra ?? ?08/22/2019: PET CT scan: Metastatic disease in the chest with multiple lung nodules all subcentimeter size and hypermetabolic lymph nodes (right paratracheal node 7 mm, subcarinal node 1 cm, right hilar node 6 mm), retroperitoneal and upper pelvic common iliac lymph nodes, bone metastases especially L5, L4, T6, right 10th rib, left proximal femur ?? ?Bone biopsy was not felt to be reliable in addition to the effect of radiation on the bone. ?Palliative radiation completed 09/03/2019 ?Guardant 360:?BRCA1 mutation, TMB 5.36, MSI: Normal ?? ?Treatment plan:?Ibrance with?letrozole?and Xgeva for bone metastases switched to abemaciclib ? ?Bone metastasis: Patient does not want to take bisphosphonates.? ?? ?CT CAP 09/23/20: Slight int enl of lingular and LLL lung nodules, Stable bone mets ?CT CAP 04/04/2021: Increase in size of lymph nodes subcarinal lymph node 1.2 cm (previously 6 mm) right lower paratracheal lymph node 6 mm, interval enlargement of lingular 9 mm (previously 5 mm) and left lower lobe lung nodules 6 mm previously 4 mm, stable bone metastases right lateral rib, L5, S1  ?CT CAP 10/05/2021: Progressive sclerotic bone metastases, stable mediastinal and hilar nodes, increase in size of lingular and left lower lobe lung nodules ?? ?Abemaciclib toxicities: ?1.  Intermittent diarrhea ?2. mild fatigue ? ?Return to clinic in 1 month with scans and follow-up ? ? ?

## 2021-12-16 LAB — CANCER ANTIGEN 27.29: CA 27.29: 16.9 U/mL (ref 0.0–38.6)

## 2021-12-21 ENCOUNTER — Inpatient Hospital Stay: Payer: Commercial Managed Care - PPO

## 2021-12-21 ENCOUNTER — Inpatient Hospital Stay: Payer: Commercial Managed Care - PPO | Admitting: Pharmacist

## 2021-12-22 ENCOUNTER — Other Ambulatory Visit: Payer: Self-pay | Admitting: *Deleted

## 2021-12-22 DIAGNOSIS — Z170 Estrogen receptor positive status [ER+]: Secondary | ICD-10-CM

## 2022-01-05 ENCOUNTER — Inpatient Hospital Stay: Payer: Commercial Managed Care - PPO | Admitting: Pharmacist

## 2022-01-05 ENCOUNTER — Inpatient Hospital Stay: Payer: Commercial Managed Care - PPO

## 2022-01-10 ENCOUNTER — Ambulatory Visit (HOSPITAL_COMMUNITY)
Admission: RE | Admit: 2022-01-10 | Discharge: 2022-01-10 | Disposition: A | Discharge status: Home / Self Care | Payer: Commercial Managed Care - PPO | Source: Ambulatory Visit | Attending: Hematology and Oncology | Admitting: Hematology and Oncology

## 2022-01-10 ENCOUNTER — Encounter (HOSPITAL_COMMUNITY): Payer: Self-pay

## 2022-01-10 DIAGNOSIS — C50112 Malignant neoplasm of central portion of left female breast: Secondary | ICD-10-CM | POA: Insufficient documentation

## 2022-01-10 DIAGNOSIS — Z170 Estrogen receptor positive status [ER+]: Secondary | ICD-10-CM

## 2022-01-10 MED ORDER — SODIUM CHLORIDE (PF) 0.9 % IJ SOLN
INTRAMUSCULAR | Status: AC
  Filled 2022-01-10: qty 50

## 2022-01-10 MED ORDER — IOHEXOL 300 MG/ML  SOLN
100.0000 mL | Freq: Once | INTRAMUSCULAR | Status: AC | PRN
  Administered 2022-01-10: 100 mL via INTRAVENOUS

## 2022-01-11 ENCOUNTER — Other Ambulatory Visit: Payer: Self-pay | Admitting: Hematology and Oncology

## 2022-01-12 ENCOUNTER — Other Ambulatory Visit: Payer: Self-pay

## 2022-01-12 ENCOUNTER — Encounter: Payer: Self-pay | Admitting: Hematology and Oncology

## 2022-01-12 ENCOUNTER — Ambulatory Visit: Payer: Commercial Managed Care - PPO

## 2022-01-12 ENCOUNTER — Inpatient Hospital Stay: Payer: Commercial Managed Care - PPO | Admitting: Pharmacist

## 2022-01-12 ENCOUNTER — Inpatient Hospital Stay: Payer: Commercial Managed Care - PPO | Attending: Hematology and Oncology

## 2022-01-12 ENCOUNTER — Inpatient Hospital Stay: Payer: Commercial Managed Care - PPO

## 2022-01-12 VITALS — BP 125/72 | HR 72 | Temp 97.8°F | Resp 18 | Ht 64.0 in | Wt 235.2 lb

## 2022-01-12 DIAGNOSIS — C50919 Malignant neoplasm of unspecified site of unspecified female breast: Secondary | ICD-10-CM

## 2022-01-12 DIAGNOSIS — Z79899 Other long term (current) drug therapy: Secondary | ICD-10-CM | POA: Insufficient documentation

## 2022-01-12 DIAGNOSIS — C50112 Malignant neoplasm of central portion of left female breast: Secondary | ICD-10-CM

## 2022-01-12 DIAGNOSIS — Z170 Estrogen receptor positive status [ER+]: Secondary | ICD-10-CM | POA: Insufficient documentation

## 2022-01-12 DIAGNOSIS — C7951 Secondary malignant neoplasm of bone: Secondary | ICD-10-CM

## 2022-01-12 LAB — CBC WITH DIFFERENTIAL (CANCER CENTER ONLY)
Abs Immature Granulocytes: 0.01 10*3/uL (ref 0.00–0.07)
Basophils Absolute: 0.1 10*3/uL (ref 0.0–0.1)
Basophils Relative: 2 %
Eosinophils Absolute: 0.1 10*3/uL (ref 0.0–0.5)
Eosinophils Relative: 4 %
HCT: 36.5 % (ref 36.0–46.0)
Hemoglobin: 12.4 g/dL (ref 12.0–15.0)
Immature Granulocytes: 0 %
Lymphocytes Relative: 29 %
Lymphs Abs: 1 10*3/uL (ref 0.7–4.0)
MCH: 33.3 pg (ref 26.0–34.0)
MCHC: 34 g/dL (ref 30.0–36.0)
MCV: 98.1 fL (ref 80.0–100.0)
Monocytes Absolute: 0.2 10*3/uL (ref 0.1–1.0)
Monocytes Relative: 6 %
Neutro Abs: 1.9 10*3/uL (ref 1.7–7.7)
Neutrophils Relative %: 59 %
Platelet Count: 231 10*3/uL (ref 150–400)
RBC: 3.72 MIL/uL — ABNORMAL LOW (ref 3.87–5.11)
RDW: 13.3 % (ref 11.5–15.5)
WBC Count: 3.3 10*3/uL — ABNORMAL LOW (ref 4.0–10.5)
nRBC: 0 % (ref 0.0–0.2)

## 2022-01-12 LAB — CMP (CANCER CENTER ONLY)
ALT: 25 U/L (ref 0–44)
AST: 22 U/L (ref 15–41)
Albumin: 4.4 g/dL (ref 3.5–5.0)
Alkaline Phosphatase: 64 U/L (ref 38–126)
Anion gap: 8 (ref 5–15)
BUN: 9 mg/dL (ref 6–20)
CO2: 28 mmol/L (ref 22–32)
Calcium: 9.7 mg/dL (ref 8.9–10.3)
Chloride: 103 mmol/L (ref 98–111)
Creatinine: 0.82 mg/dL (ref 0.44–1.00)
GFR, Estimated: >60 mL/min (ref >60)
Glucose, Bld: 130 mg/dL — ABNORMAL HIGH (ref 70–99)
Potassium: 4.4 mmol/L (ref 3.5–5.1)
Sodium: 139 mmol/L (ref 135–145)
Total Bilirubin: 0.3 mg/dL (ref 0.3–1.2)
Total Protein: 7.4 g/dL (ref 6.5–8.1)

## 2022-01-12 MED ORDER — MELOXICAM 15 MG PO TABS: 15.0000 mg | ORAL_TABLET | Freq: Every day | ORAL | 1 refills | Status: DC | PRN

## 2022-01-12 MED ORDER — FULVESTRANT 250 MG/5ML IM SOSY
500.0000 mg | PREFILLED_SYRINGE | Freq: Once | INTRAMUSCULAR | Status: AC
  Administered 2022-01-12: 500 mg via INTRAMUSCULAR
  Filled 2022-01-12: qty 10

## 2022-01-12 NOTE — Progress Notes (Signed)
[place]       Telephone: [phone]?Fax: [phone]   Oncology Clinical Pharmacist Practitioner Progress Note  Krystal [name] was contacted via in-person to discuss her chemotherapy regimen for abemaciclib which they receive under the care of Dr. [name].   Current treatment regimen and start date Abemaciclib (10/21/21) Fulvestrant (10/14/21)   Interval History She continues on abemaciclib 100 mg by mouth every 12 hours on days 1 to 28 of a 28-day cycle. This is being given  in combination with fulvestrant which is due today. Therapy is planned to continue until disease progression or unacceptable toxicity  Response to Therapy Krystal [name] was seen today by clinical pharmacy as a follow-up to her abemaciclib management.  She was last seen by Dr. Lindi Adie on 12/15/21 and clinical pharmacy on 11/24/21.  Dr. Lindi Adie is out of the office today but she did have restaging scans recently on 01/10/22.  Her overall, per the radiologist report, her disease is stable.  She will next see Dr. Lindi Adie on 02/09/22 which is when she is next due for fulvestrant.  At that time, if she is continuing to tolerate abemaciclib well, labs can likely be every 3 months along with the visit between clinical pharmacy or Dr. Lindi Adie.  She continues to be restaged every 6 months.  Krystal [name] was increased on her venlafaxine dose to 75 mg daily at her last visit with Dr. Lindi Adie.  She states she is tolerating this very well and is noticed an improvement in her mood.  She is starting to do things again like photography which she has done as a hobby in the past but had given up this for about the last 7 years per her report.  She does have some nausea which she states is controlled by her current antinausea medication regimen.  She is also having diarrhea occasionally which is controlled by loperamide.  She states that she has been taking 15 mg daily of meloxicam.  She currently has the 7.5 mg tablet and today we sent her  the 15 mg tablet that she can take daily as needed for pain to her local pharmacy of choice.  CA 27-29 value will be reported tomorrow.  She will tentatively see Dr. Lindi Adie with labs in 4 weeks when her next fulvestrant injection is scheduled.  At that point, she will be about 4 months out since starting abemaciclib.  If she is tolerating everything well, manufacturing guidelines state that she can have labs periodically after this date.  Dr. Lindi Adie may want to do labs every 3 months with scans every 6.  She will continue to get fulvestrant every 4 weeks.  labs, vitals, treatment parameters, and manufacturer guidelines assessing toxicity were reviewed with Krystal [name] today. Based on these values, patient is in agreement to continue abemaciclib therapy at this time.  Allergies Allergies  Allergen Reactions   Dilaudid [Hydromorphone] Other (See Comments)    RESPIRATORY DEPRESSION   Codeine Nausea And Vomiting    Vitals    12/15/2021    2:09 PM 11/24/2021    2:53 PM 11/10/2021    1:08 PM  Vitals with BMI  Height '5\' 4"'$   '5\' 4"'$   Weight 236 lbs 236 lbs 13 oz 240 lbs 5 oz  BMI 40.49 03.49 17.91  Systolic 505 697 948  Diastolic 71 82 76  Pulse 90 93 94     Laboratory Data    Latest Ref Rng & Units 01/12/2022    2:36 PM  12/15/2021    1:53 PM 11/24/2021    2:40 PM  CBC EXTENDED  WBC 4.0 - 10.5 K/uL 3.3  3.1  3.0   RBC 3.87 - 5.11 MIL/uL 3.72  3.70  3.65   Hemoglobin 12.0 - 15.0 g/dL 12.4  12.6  12.3   HCT 36.0 - 46.0 % 36.5  36.1  35.7   Platelets 150 - 400 K/uL 231  241  212   NEUT# 1.7 - 7.7 K/uL 1.9  1.9  1.6   Lymph# 0.7 - 4.0 K/uL 1.0  0.8  0.9        Latest Ref Rng & Units 12/15/2021    1:53 PM 11/24/2021    2:40 PM 11/10/2021   12:47 PM  CMP  Glucose 70 - 99 mg/dL 112  146  96   BUN 6 - 20 mg/dL '10  12  14   '$ Creatinine 0.44 - 1.00 mg/dL 0.78  0.93  0.98   Sodium 135 - 145 mmol/L 140  143  142   Potassium 3.5 - 5.1 mmol/L 4.0  4.0  4.2   Chloride 98 - 111 mmol/L 103  106  104    CO2 22 - 32 mmol/L '31  31  31   '$ Calcium 8.9 - 10.3 mg/dL 9.6  9.2  9.4   Total Protein 6.5 - 8.1 g/dL 7.5  7.2  7.2   Total Bilirubin 0.3 - 1.2 mg/dL 0.4  0.3  0.4   Alkaline Phos 38 - 126 U/L 65  76  81   AST 15 - 41 U/L '22  24  23   '$ ALT 0 - 44 U/L 31  33  33       Adverse Effects Assessment Nausea: Controlled by current antiemetic regimen Diarrhea: Controlled by loperamide as needed Pain: Krystal [name] not interested in tramadol or opioids at this time.  She prefers to increase her meloxicam to 15 mg daily as needed for pain.  We felt this was reasonable and sent a prescription to her local pharmacy of choice.  Adherence Assessment Krystal [name] reports missing 0 doses over the past 4 weeks.   Reason for missed dose: N/A Patient was re-educated on importance of adherence.   Access Assessment Krystal [name] is currently receiving her abemaciclib through [employer] concerns: None  Medication Reconciliation The patient's medication list was reviewed today with the patient?  Yes New medications or herbal supplements have recently been started?  No Any medications have been discontinued?  Yes, as above meloxicam 7.5 mg discontinued.  The 15 mg dose has been prescribed. The medication list was updated and reconciled based on the patient's most recent medication list in the electronic medical record (EMR) including herbal products and OTC medications.   Medications Current Outpatient Medications  Medication Sig Dispense Refill   abemaciclib (VERZENIO) 100 MG tablet Take 1 tablet (100 mg total) by mouth 2 (two) times daily. Swallow tablets whole. Do not chew, crush, or split tablets before swallowing. 56 tablet 11   aspirin-acetaminophen-caffeine (EXCEDRIN MIGRAINE) 250-250-65 MG tablet Take 2 tablets by mouth every 6 (six) hours as needed for headache.     calcium-vitamin D (OSCAL WITH D) 500-200 MG-UNIT tablet Take 1 tablet by mouth 2 (two) times daily. 180 tablet 3    esomeprazole (NEXIUM) 40 MG capsule Take 1 capsule by mouth daily.     loperamide (IMODIUM) 2 MG capsule Take 2 mg by mouth as needed for diarrhea or loose stools. Take 2 tablets  at first diarrhea and then 1 tablet after additional loose stool. Max number in 24 hours is 8 tablets.     loratadine (CLARITIN) 10 MG tablet Take by mouth.     meloxicam (MOBIC) 7.5 MG tablet TAKE 1 TABLET(7.5 MG) BY MOUTH DAILY AS NEEDED FOR PAIN 30 tablet 6   ondansetron (ZOFRAN) 8 MG tablet Take 1 tablet (8 mg total) by mouth every 8 (eight) hours as needed for nausea or vomiting. 30 tablet 10   prochlorperazine (COMPAZINE) 10 MG tablet Take 1 tablet (10 mg total) by mouth every 6 (six) hours as needed for nausea or vomiting. 30 tablet 2   traZODone (DESYREL) 50 MG tablet TAKE 1 TABLET(50 MG) BY MOUTH AT BEDTIME 30 tablet 11   venlafaxine XR (EFFEXOR-XR) 75 MG 24 hr capsule Take 1 capsule (75 mg total) by mouth at bedtime. 30 capsule 6   No current facility-administered medications for this visit.    Drug-Drug Interactions (DDIs) DDIs were evaluated?  Yes Significant DDIs?  Have discussed prior with Ms. Cundari potential for serotonin syndrome with trazodone in combination with venlafaxine.  She knows warning signs to watch out for. The patient was instructed to speak with their health care provider and/or the oral chemotherapy pharmacist before starting any new drug, including prescription or over the counter, natural / herbal products, or vitamins.  Supportive Care Diarrhea: we reviewed that diarrhea is common with abemaciclib and confirmed that she does have loperamide (Imodium) at home.  We reviewed how to take this medication PRN Neutropenia: we discussed the importance of having a thermometer and what the Centers for Disease Control and Prevention (CDC) considers a fever which is 100.44F (38C) or higher.  Gave patient 24/7 triage line to call if any fevers or symptoms ILD/Pneumonitis: we reviewed potential  symptoms including cough, shortness, and fatigue.  Hepatotoxicity: Within normal limits VTE: reviewed signs of DVT such as leg swelling, redness, pain, or tenderness and signs of PE such as shortness of breath, rapid or irregular heartbeat, cough, chest pain, or lightheadedness Reviewed to take the medication every 12 hours (with food sometimes can be easier on the stomach) and to take it at the same time every day.   Dosing Assessment Hepatic adjustments needed?  No Renal adjustments needed?  No Toxicity adjustments needed?  No The current dosing regimen is appropriate to continue at this time.  Follow-Up Plan Continue abemaciclib 100 mg every 12 hours Fulvestrant today and continue every 4 weeks Meloxicam 15 mg daily as needed for pain sent to her local pharmacy of choice.  Discontinued 7.5 mg dose. We will see Dr. Lindi Adie with labs in 4 weeks for her next fulvestrant injection.  At that point if Dr. Lindi Adie agrees, she can likely have labs every 3 months and continue scans every 6 months alternating between clinical pharmacy and Dr. Lindi Adie visits. Labs, pharmacy clinic visit, and fulvestrant injection in 8 weeks.  Krystal [name] participated in the discussion, expressed understanding, and voiced agreement with the above plan. All questions were answered to her satisfaction. The patient was advised to contact the clinic at [phone] with any questions or concerns prior to her return visit.   I spent 30 minutes assessing and educating the patient.  [name], RPH-CPP, 01/12/2022  2:45 PM   **Disclaimer: This note was dictated with voice recognition software. Similar sounding words can inadvertently be transcribed and this note may contain transcription errors which may not have been corrected upon publication of note.**

## 2022-01-13 ENCOUNTER — Ambulatory Visit: Payer: Commercial Managed Care - PPO

## 2022-01-13 ENCOUNTER — Telehealth: Payer: Self-pay | Admitting: Pharmacist

## 2022-01-13 LAB — CANCER ANTIGEN 27.29: CA 27.29: 17.7 U/mL (ref 0.0–38.6)

## 2022-01-13 NOTE — Telephone Encounter (Signed)
Scheduled appointment per 06/08 los. Patient aware.

## 2022-01-19 ENCOUNTER — Ambulatory Visit: Payer: Commercial Managed Care - PPO

## 2022-01-20 ENCOUNTER — Ambulatory Visit: Payer: Commercial Managed Care - PPO

## 2022-02-02 ENCOUNTER — Ambulatory Visit: Payer: Commercial Managed Care - PPO | Admitting: Hematology and Oncology

## 2022-02-09 ENCOUNTER — Inpatient Hospital Stay: Payer: Commercial Managed Care - PPO | Admitting: Hematology and Oncology

## 2022-02-09 ENCOUNTER — Inpatient Hospital Stay: Payer: Commercial Managed Care - PPO

## 2022-02-09 ENCOUNTER — Inpatient Hospital Stay: Payer: Commercial Managed Care - PPO | Attending: Hematology and Oncology

## 2022-02-09 DIAGNOSIS — Z9221 Personal history of antineoplastic chemotherapy: Secondary | ICD-10-CM | POA: Insufficient documentation

## 2022-02-09 DIAGNOSIS — Z923 Personal history of irradiation: Secondary | ICD-10-CM | POA: Insufficient documentation

## 2022-02-09 DIAGNOSIS — Z79818 Long term (current) use of other agents affecting estrogen receptors and estrogen levels: Secondary | ICD-10-CM | POA: Diagnosis not present

## 2022-02-09 DIAGNOSIS — R197 Diarrhea, unspecified: Secondary | ICD-10-CM | POA: Insufficient documentation

## 2022-02-09 DIAGNOSIS — C50112 Malignant neoplasm of central portion of left female breast: Secondary | ICD-10-CM | POA: Diagnosis not present

## 2022-02-09 DIAGNOSIS — Z170 Estrogen receptor positive status [ER+]: Secondary | ICD-10-CM | POA: Diagnosis not present

## 2022-02-09 DIAGNOSIS — Z79811 Long term (current) use of aromatase inhibitors: Secondary | ICD-10-CM | POA: Insufficient documentation

## 2022-02-09 DIAGNOSIS — C7951 Secondary malignant neoplasm of bone: Secondary | ICD-10-CM

## 2022-02-09 DIAGNOSIS — C50919 Malignant neoplasm of unspecified site of unspecified female breast: Secondary | ICD-10-CM

## 2022-02-09 LAB — CMP (CANCER CENTER ONLY)
ALT: 26 U/L (ref 0–44)
AST: 21 U/L (ref 15–41)
Albumin: 4.4 g/dL (ref 3.5–5.0)
Alkaline Phosphatase: 67 U/L (ref 38–126)
Anion gap: 7 (ref 5–15)
BUN: 16 mg/dL (ref 6–20)
CO2: 30 mmol/L (ref 22–32)
Calcium: 9.5 mg/dL (ref 8.9–10.3)
Chloride: 104 mmol/L (ref 98–111)
Creatinine: 0.94 mg/dL (ref 0.44–1.00)
GFR, Estimated: >60 mL/min (ref >60)
Glucose, Bld: 141 mg/dL — ABNORMAL HIGH (ref 70–99)
Potassium: 4.1 mmol/L (ref 3.5–5.1)
Sodium: 141 mmol/L (ref 135–145)
Total Bilirubin: 0.4 mg/dL (ref 0.3–1.2)
Total Protein: 7.4 g/dL (ref 6.5–8.1)

## 2022-02-09 LAB — CBC WITH DIFFERENTIAL (CANCER CENTER ONLY)
Abs Immature Granulocytes: 0.01 10*3/uL (ref 0.00–0.07)
Basophils Absolute: 0.1 10*3/uL (ref 0.0–0.1)
Basophils Relative: 2 %
Eosinophils Absolute: 0.1 10*3/uL (ref 0.0–0.5)
Eosinophils Relative: 4 %
HCT: 36 % (ref 36.0–46.0)
Hemoglobin: 12.3 g/dL (ref 12.0–15.0)
Immature Granulocytes: 0 %
Lymphocytes Relative: 29 %
Lymphs Abs: 0.8 10*3/uL (ref 0.7–4.0)
MCH: 33.9 pg (ref 26.0–34.0)
MCHC: 34.2 g/dL (ref 30.0–36.0)
MCV: 99.2 fL (ref 80.0–100.0)
Monocytes Absolute: 0.2 10*3/uL (ref 0.1–1.0)
Monocytes Relative: 8 %
Neutro Abs: 1.6 10*3/uL — ABNORMAL LOW (ref 1.7–7.7)
Neutrophils Relative %: 57 %
Platelet Count: 220 10*3/uL (ref 150–400)
RBC: 3.63 MIL/uL — ABNORMAL LOW (ref 3.87–5.11)
RDW: 13.1 % (ref 11.5–15.5)
WBC Count: 2.8 10*3/uL — ABNORMAL LOW (ref 4.0–10.5)
nRBC: 0 % (ref 0.0–0.2)

## 2022-02-09 MED ORDER — FULVESTRANT 250 MG/5ML IM SOSY
500.0000 mg | PREFILLED_SYRINGE | Freq: Once | INTRAMUSCULAR | Status: AC
  Administered 2022-02-09: 500 mg via INTRAMUSCULAR

## 2022-02-09 NOTE — Progress Notes (Signed)
Patient Care Team: Patient, [name] [name], MD as Medical Oncologist (Hematology and Oncology) [name], MD as Consulting Physician (Radiation Oncology) [name], NP as Nurse Practitioner (Hematology and Oncology) Rolm Bookbinder, MD as Consulting Physician (General Surgery) [name], RPH-CPP as Pharmacist (Hematology and Oncology)  DIAGNOSIS:  Encounter Diagnosis  Name Primary?   Malignant neoplasm of central portion of left breast in female, estrogen receptor positive ([place])     SUMMARY OF ONCOLOGIC HISTORY: Oncology History  Cancer of central portion of left female breast ([place])  04/02/2014 Genetic Testing   BRCA1 [id number] pathogenic mutation identified on gene sequce and del/dup analyses of BRCA1 and BRCA2.  BRCA1 p.[id number] VUS identified as well.  The report date is 04/02/2014.  UPDATE: BRCA1 K.[id number] VUS has been reclassified as a likely benign variant.  The reclassification date is 04/21/2020.   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 N0 M0 stage IA: Oncotype DX 13 (ROR 9%)   04/16/2014 Surgery   Left upper back melanoma resected no residual melanoma identified on re-resection margins negative   05/26/2014 - 06/08/2015 Anti-estrogen oral therapy   Tamoxifen 20 mg daily with a plan to switch her to aromatase inhibitors once she gets oophorectomy ( patient did not continue antiestrogen therapy by choice)   08/13/2014 Surgery   oophorectomy with total abdominal hysterectomy   09/08/2015 -  Anti-estrogen oral therapy   Resumed anastrozole after she found recurrence of breast cancer; stopped in January 2018, started letrozole 2.5 mg daily 10/12/2016; switched to Exemestane 25 mg daily on 01/03/17   09/16/2015 Surgery   Skin, left:  IDC grade 3; 0.5 cm, margins negative, ER 95%, PR 10%, HER-2 positive ratio 2.08   11/08/2015 - 02/2017 Chemotherapy   Herceptin every 3 weeks plus anastrozole   11/24/2015 - 01/14/2016 Radiation Therapy   Adjuvant radiation therapy by Dr. Sondra Come   01/2017 -  Anti-estrogen oral therapy   Exemestane daily (stopped others due to side effects), due to muscle aches and pains switch to tamoxifen 5 mg daily starting 12/18/2017   08/07/2019 Relapse/Recurrence   Bone scan: Sclerotic metastatic lesions involving left femoral neck, right ninth rib and entire L5 vertebra; MRI left hip: Diffuse signal abnormality L5 vertebral body with possible extraosseous extension   08/22/2019 - 09/03/2019 Radiation Therapy   Palliative radiation with Dr. Sondra Come to L5 and left proixmal femur.      CHIEF COMPLIANT: Follow-up of metastatic breast cancer on Verzenio  INTERVAL HISTORY: Kamree Wiens is a 56 y.o. with above-mentioned history of metastatic breast cancer. She presents to the clinic today for a follow-up. She states she has been having frequent diarrhea that has been making her stomach hurt. When she takes imodium she can control it.  She was concern if she could get a tattoo. She had some concerns about jaw pain.   ALLERGIES:  is allergic to dilaudid [hydromorphone] and codeine.  MEDICATIONS:  Current Outpatient Medications  Medication Sig Dispense Refill   abemaciclib (VERZENIO) 100 MG tablet Take 1 tablet (100 mg total) by mouth 2 (two) times daily. Swallow tablets whole. Do not chew, crush, or split tablets before swallowing. 56 tablet 11   aspirin-acetaminophen-caffeine (EXCEDRIN MIGRAINE) 250-250-65 MG tablet Take 2 tablets by mouth every 6 (six) hours as needed for headache.  calcium-vitamin D (OSCAL WITH D) 500-200 MG-UNIT tablet Take 1 tablet by mouth 2 (two) times daily. 180 tablet 3   esomeprazole (NEXIUM) 40 MG capsule Take 1 capsule by mouth daily.     loperamide (IMODIUM) 2 MG capsule Take 2  mg by mouth as needed for diarrhea or loose stools. Take 2 tablets at first diarrhea and then 1 tablet after additional loose stool. Max number in 24 hours is 8 tablets.     loratadine (CLARITIN) 10 MG tablet Take by mouth.     meloxicam (MOBIC) 15 MG tablet Take 1 tablet (15 mg total) by mouth daily as needed for pain. 30 tablet 1   ondansetron (ZOFRAN) 8 MG tablet Take 1 tablet (8 mg total) by mouth every 8 (eight) hours as needed for nausea or vomiting. 30 tablet 10   prochlorperazine (COMPAZINE) 10 MG tablet Take 1 tablet (10 mg total) by mouth every 6 (six) hours as needed for nausea or vomiting. 30 tablet 2   traZODone (DESYREL) 50 MG tablet TAKE 1 TABLET(50 MG) BY MOUTH AT BEDTIME 30 tablet 11   venlafaxine XR (EFFEXOR-XR) 75 MG 24 hr capsule Take 1 capsule (75 mg total) by mouth at bedtime. 30 capsule 6   No current facility-administered medications for this visit.    PHYSICAL EXAMINATION: ECOG PERFORMANCE STATUS: 1 - Symptomatic but completely ambulatory  Vitals:   02/09/22 1521  BP: 124/83  Pulse: 86  Resp: 18  Temp: 97.7 F (36.5 C)  SpO2: 96%   Filed Weights   02/09/22 1521  Weight: 231 lb 6.4 oz (105 kg)      LABORATORY DATA:  I have reviewed the data as listed    Latest Ref Rng & Units 02/09/2022    2:57 PM 01/12/2022    2:36 PM 12/15/2021    1:53 PM  CMP  Glucose 70 - 99 mg/dL 141  130  112   BUN 6 - 20 mg/dL _0 Creatinine 0.44 - 1.00 mg/dL 0.94  0.82  0.78   Sodium 135 - 145 mmol/L 141  139  140   Potassium 3.5 - 5.1 mmol/L 4.1  4.4  4.0   Chloride 98 - 111 mmol/L 104  103  103   CO2 22 - 32 mmol/L _1 Calcium 8.9 - 10.3 mg/dL 9.5  9.7  9.6   Total Protein 6.5 - 8.1 g/dL 7.4  7.4  7.5   Total Bilirubin 0.3 - 1.2 mg/dL 0.4  0.3  0.4   Alkaline Phos 38 - 126 U/L 67  64  65   AST 15 - 41 U/L _2 ALT 0 - 44 U/L _3 Lab Results  Component Value Date   WBC 2.8 (L) 02/09/2022   HGB 12.3 02/09/2022   HCT 36.0 02/09/2022    MCV 99.2 02/09/2022   PLT 220 02/09/2022   NEUTROABS 1.6 (L) 02/09/2022    ASSESSMENT & PLAN:  Cancer of central portion of left female breast ([place]) Bone scan 08/07/2019: Increase activity with sclerotic bone metastases involving left femoral neck right ninth rib and entire L5 vertebra   08/22/2019: PET CT scan: Metastatic disease in the chest with multiple lung nodules all subcentimeter size and hypermetabolic lymph nodes (right paratracheal node 7 mm, subcarinal node 1 cm, right hilar node 6 mm), retroperitoneal and upper pelvic common iliac lymph nodes, bone metastases  especially L5, L4, T6, right 10th rib, left proximal femur   Bone biopsy was not felt to be reliable in addition to the effect of radiation on the bone. Palliative radiation completed 09/03/2019 [facility]: BRCA1 mutation, TMB 5.36, MSI: Normal   Treatment plan: Ibrance with letrozole and Xgeva for bone metastases switched to abemaciclib   Bone metastasis: Patient does not want to take bisphosphonates.    CT CAP 09/23/20: Slight int enl of lingular and LLL lung nodules, Stable bone mets CT CAP 04/04/2021: Increase in size of lymph nodes subcarinal lymph node 1.2 cm (previously 6 mm) right lower paratracheal lymph node 6 mm, interval enlargement of lingular 9 mm (previously 5 mm) and left lower lobe lung nodules 6 mm previously 4 mm, stable bone metastases right lateral rib, L5, S1  CT CAP 10/05/2021: Progressive sclerotic bone metastases, stable mediastinal and hilar nodes, increase in size of lingular and left lower lobe lung nodules CT CAP 01/10/2022: Stable bilateral pulmonary nodules, stable thickening left adrenal gland, no liver or peritoneal metastasis   Abemaciclib toxicities: 1.  Intermittent diarrhea  2. mild fatigue   Return to clinic in 1 month with scans and follow-up    No orders of the defined types were placed in this encounter.  The patient has a good understanding of the overall plan. she agrees with  it. she will call with any problems that may develop before the next visit here. Total time spent: 30 mins including face to face time and time spent for planning, charting and co-ordination of care   Harriette Ohara, MD 02/09/22    I Gardiner Coins am scribing for Dr. Lindi Adie  I have reviewed the above documentation for accuracy and completeness, and I agree with the above.

## 2022-02-09 NOTE — Assessment & Plan Note (Signed)
Bone scan 08/07/2019: Increase activity with sclerotic bone metastases involving left femoral neck right ninth rib and entire L5 vertebra  08/22/2019: PET CT scan: Metastatic disease in the chest with multiple lung nodules all subcentimeter size and hypermetabolic lymph nodes (right paratracheal node 7 mm, subcarinal node 1 cm, right hilar node 6 mm), retroperitoneal and upper pelvic common iliac lymph nodes, bone metastases especially L5, L4, T6, right 10th rib, left proximal femur  Bone biopsy was not felt to be reliable in addition to the effect of radiation on the bone. Palliative radiation completed 09/03/2019 [facility]:BRCA1 mutation, TMB 5.36, MSI: Normal  Treatment plan:Ibrance withletrozoleand Xgeva for bone metastases switched to abemaciclib  Bone metastasis: Patient does not want to take bisphosphonates.  CT CAP 09/23/20: Slight int enl of lingular and LLL lung nodules, Stable bone mets CT CAP 04/04/2021: Increase in size of lymph nodes subcarinal lymph node 1.2 cm (previously 6 mm) right lower paratracheal lymph node 6 mm, interval enlargement of lingular 9 mm (previously 5 mm) and left lower lobe lung nodules 6 mm previously 4 mm, stable bone metastases right lateral rib, L5, S1  CT CAP 10/05/2021: Progressive sclerotic bone metastases, stable mediastinal and hilar nodes, increase in size of lingular and left lower lobe lung nodules CT CAP 01/10/2022: Stable bilateral pulmonary nodules, stable thickening left adrenal gland, no liver or peritoneal metastasis  Abemaciclib toxicities: 1.  Intermittent diarrhea  2. mild fatigue  Return to clinic in 1 month with scans and follow-up

## 2022-02-10 LAB — CANCER ANTIGEN 27.29: CA 27.29: 11.5 U/mL (ref 0.0–38.6)

## 2022-03-09 ENCOUNTER — Inpatient Hospital Stay: Payer: Commercial Managed Care - PPO | Attending: Hematology and Oncology

## 2022-03-09 ENCOUNTER — Inpatient Hospital Stay: Payer: Commercial Managed Care - PPO

## 2022-03-09 ENCOUNTER — Other Ambulatory Visit: Payer: Self-pay

## 2022-03-09 VITALS — BP 123/80 | HR 77 | Temp 98.5°F | Resp 20

## 2022-03-09 DIAGNOSIS — Z79818 Long term (current) use of other agents affecting estrogen receptors and estrogen levels: Secondary | ICD-10-CM | POA: Diagnosis not present

## 2022-03-09 DIAGNOSIS — Z170 Estrogen receptor positive status [ER+]: Secondary | ICD-10-CM | POA: Insufficient documentation

## 2022-03-09 DIAGNOSIS — C50919 Malignant neoplasm of unspecified site of unspecified female breast: Secondary | ICD-10-CM

## 2022-03-09 DIAGNOSIS — C50112 Malignant neoplasm of central portion of left female breast: Secondary | ICD-10-CM | POA: Insufficient documentation

## 2022-03-09 DIAGNOSIS — C7951 Secondary malignant neoplasm of bone: Secondary | ICD-10-CM

## 2022-03-09 LAB — CMP (CANCER CENTER ONLY)
ALT: 24 U/L (ref 0–44)
AST: 23 U/L (ref 15–41)
Albumin: 4.4 g/dL (ref 3.5–5.0)
Alkaline Phosphatase: 63 U/L (ref 38–126)
Anion gap: 6 (ref 5–15)
BUN: 16 mg/dL (ref 6–20)
CO2: 32 mmol/L (ref 22–32)
Calcium: 9.2 mg/dL (ref 8.9–10.3)
Chloride: 102 mmol/L (ref 98–111)
Creatinine: 0.84 mg/dL (ref 0.44–1.00)
GFR, Estimated: >60 mL/min (ref >60)
Glucose, Bld: 117 mg/dL — ABNORMAL HIGH (ref 70–99)
Potassium: 4.2 mmol/L (ref 3.5–5.1)
Sodium: 140 mmol/L (ref 135–145)
Total Bilirubin: 0.4 mg/dL (ref 0.3–1.2)
Total Protein: 7.3 g/dL (ref 6.5–8.1)

## 2022-03-09 LAB — CBC WITH DIFFERENTIAL (CANCER CENTER ONLY)
Abs Immature Granulocytes: 0 10*3/uL (ref 0.00–0.07)
Basophils Absolute: 0.1 10*3/uL (ref 0.0–0.1)
Basophils Relative: 2 %
Eosinophils Absolute: 0.2 10*3/uL (ref 0.0–0.5)
Eosinophils Relative: 6 %
HCT: 35.1 % — ABNORMAL LOW (ref 36.0–46.0)
Hemoglobin: 12.2 g/dL (ref 12.0–15.0)
Immature Granulocytes: 0 %
Lymphocytes Relative: 32 %
Lymphs Abs: 0.9 10*3/uL (ref 0.7–4.0)
MCH: 34.4 pg — ABNORMAL HIGH (ref 26.0–34.0)
MCHC: 34.8 g/dL (ref 30.0–36.0)
MCV: 98.9 fL (ref 80.0–100.0)
Monocytes Absolute: 0.2 10*3/uL (ref 0.1–1.0)
Monocytes Relative: 7 %
Neutro Abs: 1.5 10*3/uL — ABNORMAL LOW (ref 1.7–7.7)
Neutrophils Relative %: 53 %
Platelet Count: 220 10*3/uL (ref 150–400)
RBC: 3.55 MIL/uL — ABNORMAL LOW (ref 3.87–5.11)
RDW: 12.6 % (ref 11.5–15.5)
WBC Count: 2.9 10*3/uL — ABNORMAL LOW (ref 4.0–10.5)
nRBC: 0 % (ref 0.0–0.2)

## 2022-03-09 MED ORDER — FULVESTRANT 250 MG/5ML IM SOSY
500.0000 mg | PREFILLED_SYRINGE | Freq: Once | INTRAMUSCULAR | Status: AC
  Administered 2022-03-09: 500 mg via INTRAMUSCULAR
  Filled 2022-03-09: qty 10

## 2022-03-10 LAB — CANCER ANTIGEN 27.29: CA 27.29: 14.8 U/mL (ref 0.0–38.6)

## 2022-04-05 NOTE — Progress Notes (Deleted)
[facility]       Telephone: [phone]?Fax: [phone]   Oncology Clinical Pharmacist Practitioner Progress Note  Krystal [name] was contacted via {JAIVISITTYPE:26375::"in-person"} to discuss her chemotherapy regimen for abemaciclib which they receive under the care of Dr. Braxton Feathers.  Current treatment regimen and start date Abemaciclib (***)  Interval History She continues on abemaciclib *** mg by mouth every 12 hours on days 1 to 28 of a 28-day cycle. This is being given  {JAIMEDMONOCOMBO:25240::"monotherapy"}. Therapy is planned to continue until {JAItxduration:25383::"disease progression or unacceptable toxicity"}.   Response to Therapy ***. Labs, vitals, treatment parameters, and manufacturer guidelines assessing toxicity were reviewed with Krystal [name] today. Based on these values, patient is in agreement to {JAICONTINUEHOLD:25245} abemaciclib therapy at this time.  Allergies Allergies  Allergen Reactions   Dilaudid [Hydromorphone] Other (See Comments)    RESPIRATORY DEPRESSION   Codeine Nausea And Vomiting    Vitals    03/09/2022    3:12 PM 02/09/2022    3:21 PM 01/12/2022    3:12 PM  Vitals with BMI  Height  '5\' 4"'$  '5\' 4"'$   Weight  231 lbs 6 oz 235 lbs 3 oz  BMI  96.7 89.38  Systolic 101 751 025  Diastolic 80 83 72  Pulse 77 86 72   '@LASTSAO2'$ (3)@ Temp Readings from Last 3 Encounters:  03/09/22 98.5 F (36.9 C) (Oral)  02/09/22 97.7 F (36.5 C) (Temporal)  01/12/22 97.8 F (36.6 C) (Tympanic)    Laboratory Data    Latest Ref Rng & Units 03/09/2022    2:58 PM 02/09/2022    2:57 PM 01/12/2022    2:36 PM  CBC EXTENDED  WBC 4.0 - 10.5 K/uL 2.9  2.8  3.3   RBC 3.87 - 5.11 MIL/uL 3.55  3.63  3.72   Hemoglobin 12.0 - 15.0 g/dL 12.2  12.3  12.4   HCT 36.0 - 46.0 % 35.1  36.0  36.5   Platelets 150 - 400 K/uL 220  220  231   NEUT# 1.7 - 7.7 K/uL 1.5  1.6  1.9   Lymph# 0.7 - 4.0 K/uL 0.9  0.8  1.0        Latest Ref Rng & Units 03/09/2022     2:58 PM 02/09/2022    2:57 PM 01/12/2022    2:36 PM  CMP  Glucose 70 - 99 mg/dL 117  141  130   BUN 6 - 20 mg/dL '16  16  9   '$ Creatinine 0.44 - 1.00 mg/dL 0.84  0.94  0.82   Sodium 135 - 145 mmol/L 140  141  139   Potassium 3.5 - 5.1 mmol/L 4.2  4.1  4.4   Chloride 98 - 111 mmol/L 102  104  103   CO2 22 - 32 mmol/L 32  30  28   Calcium 8.9 - 10.3 mg/dL 9.2  9.5  9.7   Total Protein 6.5 - 8.1 g/dL 7.3  7.4  7.4   Total Bilirubin 0.3 - 1.2 mg/dL 0.4  0.4  0.3   Alkaline Phos 38 - 126 U/L 63  67  64   AST 15 - 41 U/L '23  21  22   '$ ALT 0 - 44 U/L '24  26  25     '$ No results found for: "MG"  Adverse Effects Assessment ***  Adherence Assessment Krystal [name] reports missing *** doses over the past *** weeks.   Reason for missed dose: *** Patient was re-educated on importance  of adherence.   Access Assessment Krystal [name] is currently receiving her abemaciclib through {JAIPHARMACY:25275::"[facility]"}  Insurance concerns:  ***  Medication Reconciliation The patient's medication list was reviewed today with the patient? {yes/no:20286} New medications or herbal supplements have recently been started? {YES/NO:21197} Any medications have been discontinued? {YES/NO:21197} The medication list was updated and reconciled based on the patient's most recent medication list in the electronic medical record (EMR) including herbal products and OTC medications.   Medications Current Outpatient Medications  Medication Sig Dispense Refill   abemaciclib (VERZENIO) 100 MG tablet Take 1 tablet (100 mg total) by mouth 2 (two) times daily. Swallow tablets whole. Do not chew, crush, or split tablets before swallowing. 56 tablet 11   aspirin-acetaminophen-caffeine (EXCEDRIN MIGRAINE) 250-250-65 MG tablet Take 2 tablets by mouth every 6 (six) hours as needed for headache.     calcium-vitamin D (OSCAL WITH D) 500-200 MG-UNIT tablet Take 1 tablet by mouth 2 (two) times daily. 180 tablet 3    esomeprazole (NEXIUM) 40 MG capsule Take 1 capsule by mouth daily.     loperamide (IMODIUM) 2 MG capsule Take 2 mg by mouth as needed for diarrhea or loose stools. Take 2 tablets at first diarrhea and then 1 tablet after additional loose stool. Max number in 24 hours is 8 tablets.     loratadine (CLARITIN) 10 MG tablet Take by mouth.     meloxicam (MOBIC) 15 MG tablet Take 1 tablet (15 mg total) by mouth daily as needed for pain. 30 tablet 1   ondansetron (ZOFRAN) 8 MG tablet Take 1 tablet (8 mg total) by mouth every 8 (eight) hours as needed for nausea or vomiting. 30 tablet 10   prochlorperazine (COMPAZINE) 10 MG tablet Take 1 tablet (10 mg total) by mouth every 6 (six) hours as needed for nausea or vomiting. 30 tablet 2   traZODone (DESYREL) 50 MG tablet TAKE 1 TABLET(50 MG) BY MOUTH AT BEDTIME 30 tablet 11   venlafaxine XR (EFFEXOR-XR) 75 MG 24 hr capsule Take 1 capsule (75 mg total) by mouth at bedtime. 30 capsule 6   No current facility-administered medications for this visit.    Drug-Drug Interactions (DDIs) DDIs were evaluated? {yes/no:20286} Significant DDIs? {YES/NO:21197} The patient was instructed to speak with their health care provider and/or the oral chemotherapy pharmacist before starting any new drug, including prescription or over the counter, natural / herbal products, or vitamins.  Supportive Care Diarrhea: we reviewed that diarrhea is common with abemaciclib and confirmed that she does have loperamide (Imodium) at home.  We reviewed how to take this medication PRN Neutropenia: we discussed the importance of having a thermometer and what the Centers for Disease Control and Prevention (CDC) considers a fever which is 100.46F (38C) or higher.  Gave patient 24/7 triage line to call if any fevers or symptoms ILD/Pneumonitis: we reviewed potential symptoms including cough, shortness, and fatigue.  Hepatotoxicity: *** VTE: reviewed signs of DVT such as leg swelling, redness,  pain, or tenderness and signs of PE such as shortness of breath, rapid or irregular heartbeat, cough, chest pain, or lightheadedness Reviewed to take the medication every 12 hours (with food sometimes can be easier on the stomach) and to take it at the same time every day.  ***  Dosing Assessment Hepatic adjustments needed? {YES/NO:21197} Renal adjustments needed? {YES/NO:21197} Toxicity adjustments needed? {YES/NO:21197} The current dosing regimen {JAIISNOT:25241::"is"} appropriate to continue at this time.  Follow-Up Plan ***  Krystal [name] participated in the discussion, expressed  understanding, and voiced agreement with the above plan. All questions were answered to her satisfaction. The patient was advised to contact the clinic at [phone] with any questions or concerns prior to her return visit.   I spent *** minutes assessing and educating the patient.  [name], RPH-CPP, 04/05/2022  8:53 AM   **Disclaimer: This note was dictated with voice recognition software. Similar sounding words can inadvertently be transcribed and this note may contain transcription errors which may not have been corrected upon publication of note.**

## 2022-04-06 ENCOUNTER — Inpatient Hospital Stay: Payer: Commercial Managed Care - PPO

## 2022-04-06 ENCOUNTER — Other Ambulatory Visit: Payer: Self-pay

## 2022-04-06 ENCOUNTER — Inpatient Hospital Stay: Payer: Commercial Managed Care - PPO | Admitting: Pharmacist

## 2022-04-06 VITALS — BP 134/67 | HR 79 | Temp 97.5°F | Resp 18 | Ht 64.0 in | Wt 234.9 lb

## 2022-04-06 DIAGNOSIS — Z170 Estrogen receptor positive status [ER+]: Secondary | ICD-10-CM

## 2022-04-06 DIAGNOSIS — C7951 Secondary malignant neoplasm of bone: Secondary | ICD-10-CM

## 2022-04-06 DIAGNOSIS — C50919 Malignant neoplasm of unspecified site of unspecified female breast: Secondary | ICD-10-CM

## 2022-04-06 DIAGNOSIS — C50112 Malignant neoplasm of central portion of left female breast: Secondary | ICD-10-CM | POA: Diagnosis not present

## 2022-04-06 LAB — CBC WITH DIFFERENTIAL (CANCER CENTER ONLY)
Abs Immature Granulocytes: 0.01 10*3/uL (ref 0.00–0.07)
Basophils Absolute: 0.1 10*3/uL (ref 0.0–0.1)
Basophils Relative: 2 %
Eosinophils Absolute: 0.2 10*3/uL (ref 0.0–0.5)
Eosinophils Relative: 6 %
HCT: 33.3 % — ABNORMAL LOW (ref 36.0–46.0)
Hemoglobin: 11.5 g/dL — ABNORMAL LOW (ref 12.0–15.0)
Immature Granulocytes: 0 %
Lymphocytes Relative: 34 %
Lymphs Abs: 0.9 10*3/uL (ref 0.7–4.0)
MCH: 34 pg (ref 26.0–34.0)
MCHC: 34.5 g/dL (ref 30.0–36.0)
MCV: 98.5 fL (ref 80.0–100.0)
Monocytes Absolute: 0.2 10*3/uL (ref 0.1–1.0)
Monocytes Relative: 9 %
Neutro Abs: 1.3 10*3/uL — ABNORMAL LOW (ref 1.7–7.7)
Neutrophils Relative %: 49 %
Platelet Count: 184 10*3/uL (ref 150–400)
RBC: 3.38 MIL/uL — ABNORMAL LOW (ref 3.87–5.11)
RDW: 12.9 % (ref 11.5–15.5)
WBC Count: 2.7 10*3/uL — ABNORMAL LOW (ref 4.0–10.5)
nRBC: 0 % (ref 0.0–0.2)

## 2022-04-06 LAB — CMP (CANCER CENTER ONLY)
ALT: 30 U/L (ref 0–44)
AST: 26 U/L (ref 15–41)
Albumin: 4.1 g/dL (ref 3.5–5.0)
Alkaline Phosphatase: 60 U/L (ref 38–126)
Anion gap: 5 (ref 5–15)
BUN: 14 mg/dL (ref 6–20)
CO2: 31 mmol/L (ref 22–32)
Calcium: 9.4 mg/dL (ref 8.9–10.3)
Chloride: 106 mmol/L (ref 98–111)
Creatinine: 0.78 mg/dL (ref 0.44–1.00)
GFR, Estimated: >60 mL/min (ref >60)
Glucose, Bld: 89 mg/dL (ref 70–99)
Potassium: 4.1 mmol/L (ref 3.5–5.1)
Sodium: 142 mmol/L (ref 135–145)
Total Bilirubin: 0.5 mg/dL (ref 0.3–1.2)
Total Protein: 6.8 g/dL (ref 6.5–8.1)

## 2022-04-06 MED ORDER — FULVESTRANT 250 MG/5ML IM SOSY
500.0000 mg | PREFILLED_SYRINGE | Freq: Once | INTRAMUSCULAR | Status: AC
  Administered 2022-04-06: 500 mg via INTRAMUSCULAR
  Filled 2022-04-06: qty 10

## 2022-04-06 NOTE — Progress Notes (Signed)
[place]       Telephone: [phone]?Fax: [phone]   Oncology Clinical Pharmacist Practitioner Progress Note  Krystal [name] was contacted via in-person to discuss her chemotherapy regimen for abemaciclib which they receive under the care of Dr. [name].   Current treatment regimen and start date Abemaciclib (10/21/21) Fulvestrant (10/14/21)   Interval History She continues on abemaciclib 100 mg by mouth every 12 hours on days 1 to 28 of a 28-day cycle. This is being given  in combination with fulvestrant which is due today. Therapy is planned to continue until disease progression or unacceptable toxicity.  Krystal [name] was seen today by clinical pharmacy as a follow-up to her abemaciclib management.  She was last seen by Dr. Lindi Adie on 02/09/22 and clinical pharmacy on 01/12/22.  She last had scans that were reviewed by Dr. Lindi Adie on 01/07/22 that showed stable disease.  She receives scans every 6 months and is next due in December.  Response to Therapy Krystal [name] is doing well.  She continues on abemaciclib 100 mg by mouth every 12 hours.  She also continues on fulvestrant which is due today and she receives this every 4 weeks.  She is not interested in starting a bone strengthener at this time for her bone disease.  She recently got back from the beach [facility]) and had a great time with her family.  She is not reporting any new or worsening side effects with abemaciclib.  She does have occasional diarrhea which responds to loperamide.  She does report some injection site soreness where she receives her fulvestrant.  We did explain to let the nurses in the infusion area know so that they can properly rotate the injection sites for administration.  CA 27.29 is not back yet today but will be reported out to Dr. Lindi Adie likely sometime tomorrow.  Overall her labs look good, her Globe has trended down slowly and today is approximately 1300 cells/L.  She has not fallen below 1000  while on abemaciclib but out of abundance of caution we will repeat labs in 4 weeks when she is next due for fulvestrant.  If she continues to tolerate abemaciclib well, we explained that manufacturing guidelines say that labs can be done periodically or as clinically indicated now that she has been on abemaciclib since March of this year.  Therefore, she will see clinical pharmacy again in 4 weeks and then see Dr. Lindi Adie again at the end of December and at that time he will review restaging scans which are every 6 months. Labs, vitals, treatment parameters, and manufacturer guidelines assessing toxicity were reviewed with Krystal [name] today. Based on these values, patient is in agreement to continue abemaciclib therapy at this time.  Allergies Allergies  Allergen Reactions   Dilaudid [Hydromorphone] Other (See Comments)    RESPIRATORY DEPRESSION   Codeine Nausea And Vomiting    Vitals    04/06/2022    3:13 PM 03/09/2022    3:12 PM 02/09/2022    3:21 PM  Vitals with BMI  Height '5\' 4"'$   '5\' 4"'$   Weight 234 lbs 14 oz  231 lbs 6 oz  BMI 09.6  28.3  Systolic 662 947 654  Diastolic 67 80 83  Pulse 79 77 86   Temp Readings from Last 3 Encounters:  04/06/22 (!) 97.5 F (36.4 C) (Tympanic)  03/09/22 98.5 F (36.9 C) (Oral)  02/09/22 97.7 F (36.5 C) (Temporal)    Laboratory Data  Latest Ref Rng & Units 04/06/2022    2:55 PM 03/09/2022    2:58 PM 02/09/2022    2:57 PM  CBC EXTENDED  WBC 4.0 - 10.5 K/uL 2.7  2.9  2.8   RBC 3.87 - 5.11 MIL/uL 3.38  3.55  3.63   Hemoglobin 12.0 - 15.0 g/dL 11.5  12.2  12.3   HCT 36.0 - 46.0 % 33.3  35.1  36.0   Platelets 150 - 400 K/uL 184  220  220   NEUT# 1.7 - 7.7 K/uL 1.3  1.5  1.6   Lymph# 0.7 - 4.0 K/uL 0.9  0.9  0.8        Latest Ref Rng & Units 04/06/2022    2:55 PM 03/09/2022    2:58 PM 02/09/2022    2:57 PM  CMP  Glucose 70 - 99 mg/dL 89  117  141   BUN 6 - 20 mg/dL '14  16  16   '$ Creatinine 0.44 - 1.00 mg/dL 0.78  0.84  0.94   Sodium 135 - 145  mmol/L 142  140  141   Potassium 3.5 - 5.1 mmol/L 4.1  4.2  4.1   Chloride 98 - 111 mmol/L 106  102  104   CO2 22 - 32 mmol/L 31  32  30   Calcium 8.9 - 10.3 mg/dL 9.4  9.2  9.5   Total Protein 6.5 - 8.1 g/dL 6.8  7.3  7.4   Total Bilirubin 0.3 - 1.2 mg/dL 0.5  0.4  0.4   Alkaline Phos 38 - 126 U/L 60  63  67   AST 15 - 41 U/L '26  23  21   '$ ALT 0 - 44 U/L '30  24  26     '$ No results found for: "MG"  Adverse Effects Assessment Diarrhea: Controlled with loperamide  Adherence Assessment Krystal [name] reports missing 3 doses over the past 4 weeks.   Reason for missed dose: Missed morning doses while [place] the beach Patient was re-educated on importance of adherence.   Access Assessment Krystal [name] is currently receiving her abemaciclib through [employer] concerns: None  Medication Reconciliation The patient's medication list was reviewed today with the patient?  Yes New medications or herbal supplements have recently been started?  No Any medications have been discontinued?  No The medication list was updated and reconciled based on the patient's most recent medication list in the electronic medical record (EMR) including herbal products and OTC medications.   Medications Current Outpatient Medications  Medication Sig Dispense Refill   abemaciclib (VERZENIO) 100 MG tablet Take 1 tablet (100 mg total) by mouth 2 (two) times daily. Swallow tablets whole. Do not chew, crush, or split tablets before swallowing. 56 tablet 11   aspirin-acetaminophen-caffeine (EXCEDRIN MIGRAINE) 250-250-65 MG tablet Take 2 tablets by mouth every 6 (six) hours as needed for headache.     calcium-vitamin D (OSCAL WITH D) 500-200 MG-UNIT tablet Take 1 tablet by mouth 2 (two) times daily. 180 tablet 3   esomeprazole (NEXIUM) 40 MG capsule Take 1 capsule by mouth daily.     loperamide (IMODIUM) 2 MG capsule Take 2 mg by mouth as needed for diarrhea or loose stools. Take 2 tablets at first  diarrhea and then 1 tablet after additional loose stool. Max number in 24 hours is 8 tablets.     loratadine (CLARITIN) 10 MG tablet Take by mouth.     meloxicam (MOBIC) 15 MG tablet Take 1 tablet (  15 mg total) by mouth daily as needed for pain. 30 tablet 1   ondansetron (ZOFRAN) 8 MG tablet Take 1 tablet (8 mg total) by mouth every 8 (eight) hours as needed for nausea or vomiting. 30 tablet 10   prochlorperazine (COMPAZINE) 10 MG tablet Take 1 tablet (10 mg total) by mouth every 6 (six) hours as needed for nausea or vomiting. 30 tablet 2   traZODone (DESYREL) 50 MG tablet TAKE 1 TABLET(50 MG) BY MOUTH AT BEDTIME 30 tablet 11   venlafaxine XR (EFFEXOR-XR) 75 MG 24 hr capsule Take 1 capsule (75 mg total) by mouth at bedtime. 30 capsule 6   No current facility-administered medications for this visit.    Drug-Drug Interactions (DDIs) DDIs were evaluated?  Yes Significant DDIs?  We have gone over the possibility of serotonin syndrome with Krystal [name] before and given her written information on warning signs since she is on several agents that could cause it. The patient was instructed to speak with their health care provider and/or the oral chemotherapy pharmacist before starting any new drug, including prescription or over the counter, natural / herbal products, or vitamins.  Supportive Care Diarrhea: we reviewed that diarrhea is common with abemaciclib and confirmed that she does have loperamide (Imodium) at home.  We reviewed how to take this medication PRN ILD/Pneumonitis: we reviewed potential symptoms including cough, shortness, and fatigue.  VTE: reviewed signs of DVT such as leg swelling, redness, pain, or tenderness and signs of PE such as shortness of breath, rapid or irregular heartbeat, cough, chest pain, or lightheadedness  Dosing Assessment Hepatic adjustments needed?  No Renal adjustments needed?  No Toxicity adjustments needed?  No The current dosing regimen is appropriate to  continue at this time.  Follow-Up Plan Continue abemaciclib 100 mg by mouth every 12 hours Continue fulvestrant 500 mg intramuscularly every 28 days.  Will be administered today. Continue loperamide as needed for diarrhea Continue calcium/vitamin D supplement.  Krystal [name] continues to be not interested in a bone strengthener for her bone metastases.  She will let the clinic know if anything changes. Labs, pharmacy clinic visit, fulvestrant, on 05/04/22.  We will order restaging scans at that time.  Last done 01/07/22 and done every 6 months. Fulvestrant appointments will be added for 06/01/22 and 06/28/22.  Labs are not needed unless patient is symptomatic. Labs, Dr. Lindi Adie visit, fulvestrant, and discussed restaging scans on 08/03/22  Krystal [name] participated in the discussion, expressed understanding, and voiced agreement with the above plan. All questions were answered to her satisfaction. The patient was advised to contact the clinic at [phone] with any questions or concerns prior to her return visit.   I spent 60 minutes assessing and educating the patient.  [name], RPH-CPP, 04/06/2022  3:56 PM   **Disclaimer: This note was dictated with voice recognition software. Similar sounding words can inadvertently be transcribed and this note may contain transcription errors which may not have been corrected upon publication of note.**

## 2022-04-06 NOTE — Patient Instructions (Signed)

## 2022-04-07 LAB — CANCER ANTIGEN 27.29: CA 27.29: 13.1 U/mL (ref 0.0–38.6)

## 2022-04-17 ENCOUNTER — Other Ambulatory Visit: Payer: Self-pay | Admitting: Hematology and Oncology

## 2022-04-18 ENCOUNTER — Other Ambulatory Visit: Payer: Self-pay | Admitting: *Deleted

## 2022-04-18 MED ORDER — MELOXICAM 15 MG PO TABS: 15.0000 mg | ORAL_TABLET | Freq: Every day | ORAL | 1 refills | Status: DC | PRN

## 2022-04-24 ENCOUNTER — Telehealth: Payer: Self-pay

## 2022-04-24 NOTE — Telephone Encounter (Signed)
Pt called and states she will need to have dental exam for a possible extraction 9/20. She states dental clearance is needed. Called [facility] and spoke with a dental hygienist there who states they only need clearance to perform and exam and take xrays at this time. They will call us 9/20 after 2 PM to let us know if we need to send in abx and give instructions to stop verzenio before any intervention. Pt is aware and knows we will be waiting for call.

## 2022-04-26 ENCOUNTER — Telehealth: Payer: Self-pay

## 2022-04-26 MED ORDER — AMOXICILLIN 875 MG PO TABS: 875.0000 mg | ORAL_TABLET | Freq: Two times a day (BID) | ORAL | 0 refills | Status: AC

## 2022-04-26 NOTE — Telephone Encounter (Signed)
Received call from August at [place] who states pt has infection and will need root canal with crown or extraction; whichever pt decides. August asks what precautions to take for pt. Per MD, pt will need to stop Verzenio 7 days prior to procedure then resume 7 days after. We will send in Amoxicillin 875 mg one tab PO BID x 7 days. Pt reports she has not taken Verzenio since Monday 9/18. August knows to call with any further concerns or questions.

## 2022-05-04 ENCOUNTER — Telehealth: Payer: Self-pay | Admitting: Hematology and Oncology

## 2022-05-04 ENCOUNTER — Inpatient Hospital Stay: Payer: Commercial Managed Care - PPO

## 2022-05-04 ENCOUNTER — Inpatient Hospital Stay: Payer: Commercial Managed Care - PPO | Attending: Hematology and Oncology

## 2022-05-04 ENCOUNTER — Inpatient Hospital Stay: Payer: Commercial Managed Care - PPO | Admitting: Pharmacist

## 2022-05-04 NOTE — Telephone Encounter (Signed)
Called patient per 9/28 secure chat to see if she was coming into today's appointments which were labs, see [name] from pharmacy and to get an injection. Request for information was asked by Gilford Rile. Scheduler called patient and patient did not answer, but scheduler left a VM.

## 2022-05-11 ENCOUNTER — Inpatient Hospital Stay: Payer: Commercial Managed Care - PPO | Attending: Hematology and Oncology

## 2022-05-11 ENCOUNTER — Other Ambulatory Visit: Payer: Self-pay

## 2022-05-11 ENCOUNTER — Inpatient Hospital Stay: Payer: Commercial Managed Care - PPO | Admitting: Pharmacist

## 2022-05-11 ENCOUNTER — Inpatient Hospital Stay: Payer: Commercial Managed Care - PPO

## 2022-05-11 VITALS — BP 139/80 | HR 80 | Temp 97.8°F | Resp 18 | Ht 64.0 in | Wt 228.2 lb

## 2022-05-11 DIAGNOSIS — Z79818 Long term (current) use of other agents affecting estrogen receptors and estrogen levels: Secondary | ICD-10-CM | POA: Diagnosis not present

## 2022-05-11 DIAGNOSIS — C7951 Secondary malignant neoplasm of bone: Secondary | ICD-10-CM

## 2022-05-11 DIAGNOSIS — C50919 Malignant neoplasm of unspecified site of unspecified female breast: Secondary | ICD-10-CM

## 2022-05-11 DIAGNOSIS — C50112 Malignant neoplasm of central portion of left female breast: Secondary | ICD-10-CM | POA: Diagnosis present

## 2022-05-11 LAB — CBC WITH DIFFERENTIAL (CANCER CENTER ONLY)
Abs Immature Granulocytes: 0 10*3/uL (ref 0.00–0.07)
Basophils Absolute: 0.1 10*3/uL (ref 0.0–0.1)
Basophils Relative: 2 %
Eosinophils Absolute: 0.3 10*3/uL (ref 0.0–0.5)
Eosinophils Relative: 8 %
HCT: 36.6 % (ref 36.0–46.0)
Hemoglobin: 12.3 g/dL (ref 12.0–15.0)
Immature Granulocytes: 0 %
Lymphocytes Relative: 25 %
Lymphs Abs: 0.9 10*3/uL (ref 0.7–4.0)
MCH: 33.4 pg (ref 26.0–34.0)
MCHC: 33.6 g/dL (ref 30.0–36.0)
MCV: 99.5 fL (ref 80.0–100.0)
Monocytes Absolute: 0.2 10*3/uL (ref 0.1–1.0)
Monocytes Relative: 6 %
Neutro Abs: 2.2 10*3/uL (ref 1.7–7.7)
Neutrophils Relative %: 59 %
Platelet Count: 290 10*3/uL (ref 150–400)
RBC: 3.68 MIL/uL — ABNORMAL LOW (ref 3.87–5.11)
RDW: 12.4 % (ref 11.5–15.5)
WBC Count: 3.7 10*3/uL — ABNORMAL LOW (ref 4.0–10.5)
nRBC: 0 % (ref 0.0–0.2)

## 2022-05-11 LAB — CMP (CANCER CENTER ONLY)
ALT: 41 U/L (ref 0–44)
AST: 37 U/L (ref 15–41)
Albumin: 4.3 g/dL (ref 3.5–5.0)
Alkaline Phosphatase: 63 U/L (ref 38–126)
Anion gap: 6 (ref 5–15)
BUN: 13 mg/dL (ref 6–20)
CO2: 31 mmol/L (ref 22–32)
Calcium: 9.3 mg/dL (ref 8.9–10.3)
Chloride: 102 mmol/L (ref 98–111)
Creatinine: 0.82 mg/dL (ref 0.44–1.00)
GFR, Estimated: >60 mL/min (ref >60)
Glucose, Bld: 120 mg/dL — ABNORMAL HIGH (ref 70–99)
Potassium: 4.2 mmol/L (ref 3.5–5.1)
Sodium: 139 mmol/L (ref 135–145)
Total Bilirubin: 0.3 mg/dL (ref 0.3–1.2)
Total Protein: 7.5 g/dL (ref 6.5–8.1)

## 2022-05-11 MED ORDER — FULVESTRANT 250 MG/5ML IM SOSY
500.0000 mg | PREFILLED_SYRINGE | Freq: Once | INTRAMUSCULAR | Status: AC
  Administered 2022-05-11: 500 mg via INTRAMUSCULAR
  Filled 2022-05-11: qty 10

## 2022-05-11 NOTE — Progress Notes (Signed)
[place]       Telephone: [phone]?Fax: [phone]   Oncology Clinical Pharmacist Practitioner Progress Note  Krystal [name] was contacted via in-person to discuss her chemotherapy regimen for abemaciclib which they receive under the care of Dr. [name].   Current treatment regimen and start date Abemaciclib (10/21/21) Fulvestrant (10/14/21)   Interval History She continues on abemaciclib 100 mg by mouth every 12 hours on days 1 to 28 of a 28-day cycle. This is being given in combination with fulvestrant which is due today. Therapy is planned to continue until disease progression or unacceptable toxicity. Ms. Falconi was seen today by clinical pharmacy as a follow-up to her abemaciclib management. She was last seen by Dr. Lindi Adie on 02/09/22 and clinical pharmacy on 04/06/22.  She last had scans that were reviewed by Dr. Lindi Adie on 01/07/22 that showed stable disease.  She receives scans every 6 months and is next due in December. She had a tooth extraction recently and was off abemaciclib for two weeks. She restarted on Monday 05/08/22 and finished amoxicillin on 05/03/22.    Response to Therapy Ms. Bias is doing well from an [employer]. She unfortunately had to put her dog down that she had for many years which has been very difficult on her. She is not experiencing any side effects since restarting abemaciclib and her ANC and Hemoglobin are now normal. She will receive fulvestrant today and then again in 5 weeks since she will not be available in 4 weeks and prefers Thursdays. We have updated her schedule and gave a copy to her during our visit.  Restaging scans have been ordered for December, she will have a telephone call with Dr. Lindi Adie to review these results and continue on her current treatment if scans are stable or improved. Labs, vitals, treatment parameters, and manufacturer guidelines assessing toxicity were reviewed with Belia Heman today. Based on these  values, patient is in agreement to continue abemaciclib therapy at this time.  Allergies Allergies  Allergen Reactions   Dilaudid [Hydromorphone] Other (See Comments)    RESPIRATORY DEPRESSION   Codeine Nausea And Vomiting    Vitals    05/11/2022    2:58 PM 04/06/2022    3:13 PM 03/09/2022    3:12 PM  Vitals with BMI  Height '5\' 4"'$  '5\' 4"'$    Weight 228 lbs 3 oz 234 lbs 14 oz   BMI 78.67 67.2   Systolic 094 709 628  Diastolic 80 67 80  Pulse 80 79 77   Temp Readings from Last 3 Encounters:  05/11/22 97.8 F (36.6 C) (Tympanic)  04/06/22 (!) 97.5 F (36.4 C) (Tympanic)  03/09/22 98.5 F (36.9 C) (Oral)    Laboratory Data    Latest Ref Rng & Units 05/11/2022    2:29 PM 04/06/2022    2:55 PM 03/09/2022    2:58 PM  CBC EXTENDED  WBC 4.0 - 10.5 K/uL 3.7  2.7  2.9   RBC 3.87 - 5.11 MIL/uL 3.68  3.38  3.55   Hemoglobin 12.0 - 15.0 g/dL 12.3  11.5  12.2   HCT 36.0 - 46.0 % 36.6  33.3  35.1   Platelets 150 - 400 K/uL 290  184  220   NEUT# 1.7 - 7.7 K/uL 2.2  1.3  1.5   Lymph# 0.7 - 4.0 K/uL 0.9  0.9  0.9        Latest Ref Rng & Units 05/11/2022    2:29 PM 04/06/2022  2:55 PM 03/09/2022    2:58 PM  CMP  Glucose 70 - 99 mg/dL 120  89  117   BUN 6 - 20 mg/dL '13  14  16   '$ Creatinine 0.44 - 1.00 mg/dL 0.82  0.78  0.84   Sodium 135 - 145 mmol/L 139  142  140   Potassium 3.5 - 5.1 mmol/L 4.2  4.1  4.2   Chloride 98 - 111 mmol/L 102  106  102   CO2 22 - 32 mmol/L 31  31  32   Calcium 8.9 - 10.3 mg/dL 9.3  9.4  9.2   Total Protein 6.5 - 8.1 g/dL 7.5  6.8  7.3   Total Bilirubin 0.3 - 1.2 mg/dL 0.3  0.5  0.4   Alkaline Phos 38 - 126 U/L 63  60  63   AST 15 - 41 U/L 37  26  23   ALT 0 - 44 U/L 41  30  24     No results found for: "MG"  Adverse Effects Assessment No adverse effects reported at this time  Adherence Assessment [place] reports missing 3 doses over the past 6 weeks. Did miss two weeks due to tooth extraction   Reason for missed dose: was busy at work and  remembered tool late Patient was re-educated on importance of adherence.   Access Assessment Kandas Oliveto is currently receiving her abemaciclib through  [employer] concerns:  none  Medication Reconciliation The patient's medication list was reviewed today with the patient? Yes New medications or herbal supplements have recently been started? No  Any medications have been discontinued? No  The medication list was updated and reconciled based on the patient's most recent medication list in the electronic medical record (EMR) including herbal products and OTC medications.   Medications Current Outpatient Medications  Medication Sig Dispense Refill   abemaciclib (VERZENIO) 100 MG tablet Take 1 tablet (100 mg total) by mouth 2 (two) times daily. Swallow tablets whole. Do not chew, crush, or split tablets before swallowing. 56 tablet 11   aspirin-acetaminophen-caffeine (EXCEDRIN MIGRAINE) 250-250-65 MG tablet Take 2 tablets by mouth every 6 (six) hours as needed for headache.     calcium-vitamin D (OSCAL WITH D) 500-200 MG-UNIT tablet Take 1 tablet by mouth 2 (two) times daily. 180 tablet 3   esomeprazole (NEXIUM) 40 MG capsule Take 1 capsule by mouth daily.     loperamide (IMODIUM) 2 MG capsule Take 2 mg by mouth as needed for diarrhea or loose stools. Take 2 tablets at first diarrhea and then 1 tablet after additional loose stool. Max number in 24 hours is 8 tablets.     loratadine (CLARITIN) 10 MG tablet Take by mouth.     meloxicam (MOBIC) 15 MG tablet TAKE 1 TABLET(15 MG) BY MOUTH DAILY AS NEEDED FOR PAIN 30 tablet 1   meloxicam (MOBIC) 15 MG tablet Take 1 tablet (15 mg total) by mouth daily as needed for pain. 30 tablet 1   ondansetron (ZOFRAN) 8 MG tablet Take 1 tablet (8 mg total) by mouth every 8 (eight) hours as needed for nausea or vomiting. 30 tablet 10   prochlorperazine (COMPAZINE) 10 MG tablet Take 1 tablet (10 mg total) by mouth every 6 (six) hours as  needed for nausea or vomiting. 30 tablet 2   traZODone (DESYREL) 50 MG tablet TAKE 1 TABLET(50 MG) BY MOUTH AT BEDTIME 30 tablet 11   venlafaxine XR (EFFEXOR-XR) 75 MG 24  hr capsule Take 1 capsule (75 mg total) by mouth at bedtime. 30 capsule 6   No current facility-administered medications for this visit.    Drug-Drug Interactions (DDIs) DDIs were evaluated? Yes Significant DDIs? Yes , we again reviewed Serotonin Syndrome and gave her written information on this condition and warning signs to watch for The patient was instructed to speak with their health care provider and/or the oral chemotherapy pharmacist before starting any new drug, including prescription or over the counter, natural / herbal products, or vitamins.  Supportive Care Diarrhea: we reviewed that diarrhea is common with abemaciclib and confirmed that she does have loperamide (Imodium) at home.  We reviewed how to take this medication PRN Neutropenia: we discussed the importance of having a thermometer and what the Centers for Disease Control and Prevention (CDC) considers a fever which is 100.68F (38C) or higher.  Gave patient 24/7 triage line to call if any fevers or symptoms  Dosing Assessment Hepatic adjustments needed? No  Renal adjustments needed? No  Toxicity adjustments needed? No  The current dosing regimen is appropriate to continue at this time.  Follow-Up Plan Continue abemaciclib 100 mg by mouth every 12 hours Continue fulvestrant 500 mg IM every 28 days. Dose today and then in 5 weeks per patient preference CA 27.29 trending down. Not resulted that was done today.  Restaging scans ordered every 6 months for December 2023. Will have telephone visit with Dr. Lindi Adie to review Labs, pharmacy clinic visit, fulvestrant on 09/14/22  Belia Heman participated in the discussion, expressed understanding, and voiced agreement with the above plan. All questions were answered to her satisfaction. The patient was advised  to contact the clinic at [phone] with any questions or concerns prior to her return visit.   I spent 30 minutes assessing and educating the patient.  [name], RPH-CPP, 05/11/2022  2:48 PM   **Disclaimer: This note was dictated with voice recognition software. Similar sounding words can inadvertently be transcribed and this note may contain transcription errors which may not have been corrected upon publication of note.**

## 2022-05-11 NOTE — Patient Instructions (Addendum)
  Upcoming appointments  - Fulvestrant 06/15/22  - Fulvestrant 07/13/22  - Telephone appointment Dr. Lindi Adie on 08/03/22. Will review scans  - Fulvestrant injection on 08/10/22  - Lab, [place], fulvestrant on 09/14/22

## 2022-05-12 LAB — CANCER ANTIGEN 27.29: CA 27.29: 17.7 U/mL (ref 0.0–38.6)

## 2022-06-01 ENCOUNTER — Inpatient Hospital Stay: Payer: Commercial Managed Care - PPO

## 2022-06-15 ENCOUNTER — Inpatient Hospital Stay: Payer: Commercial Managed Care - PPO | Attending: Hematology and Oncology

## 2022-06-15 ENCOUNTER — Other Ambulatory Visit: Payer: Self-pay

## 2022-06-15 VITALS — BP 139/86 | HR 78 | Resp 17

## 2022-06-15 DIAGNOSIS — C50112 Malignant neoplasm of central portion of left female breast: Secondary | ICD-10-CM | POA: Diagnosis not present

## 2022-06-15 DIAGNOSIS — Z170 Estrogen receptor positive status [ER+]: Secondary | ICD-10-CM | POA: Insufficient documentation

## 2022-06-15 DIAGNOSIS — Z5111 Encounter for antineoplastic chemotherapy: Secondary | ICD-10-CM | POA: Insufficient documentation

## 2022-06-15 DIAGNOSIS — C7951 Secondary malignant neoplasm of bone: Secondary | ICD-10-CM | POA: Diagnosis not present

## 2022-06-15 DIAGNOSIS — C50919 Malignant neoplasm of unspecified site of unspecified female breast: Secondary | ICD-10-CM

## 2022-06-15 MED ORDER — FULVESTRANT 250 MG/5ML IM SOSY
500.0000 mg | PREFILLED_SYRINGE | Freq: Once | INTRAMUSCULAR | Status: AC
  Administered 2022-06-15: 500 mg via INTRAMUSCULAR

## 2022-06-22 ENCOUNTER — Other Ambulatory Visit: Payer: Self-pay | Admitting: Hematology and Oncology

## 2022-06-28 ENCOUNTER — Inpatient Hospital Stay: Payer: Commercial Managed Care - PPO

## 2022-07-03 ENCOUNTER — Telehealth: Payer: Self-pay | Admitting: Hematology and Oncology

## 2022-07-13 ENCOUNTER — Inpatient Hospital Stay: Payer: Commercial Managed Care - PPO

## 2022-07-21 ENCOUNTER — Inpatient Hospital Stay: Payer: Commercial Managed Care - PPO | Attending: Hematology and Oncology

## 2022-07-21 VITALS — BP 125/67 | HR 80 | Temp 98.4°F | Resp 18

## 2022-07-21 DIAGNOSIS — C7951 Secondary malignant neoplasm of bone: Secondary | ICD-10-CM | POA: Insufficient documentation

## 2022-07-21 DIAGNOSIS — C50919 Malignant neoplasm of unspecified site of unspecified female breast: Secondary | ICD-10-CM

## 2022-07-21 MED ORDER — FULVESTRANT 250 MG/5ML IM SOSY
500.0000 mg | PREFILLED_SYRINGE | Freq: Once | INTRAMUSCULAR | Status: DC
  Filled 2022-07-21: qty 10

## 2022-07-23 ENCOUNTER — Other Ambulatory Visit: Payer: Self-pay | Admitting: Hematology and Oncology

## 2022-08-03 ENCOUNTER — Inpatient Hospital Stay: Payer: Commercial Managed Care - PPO

## 2022-08-03 ENCOUNTER — Inpatient Hospital Stay: Payer: Commercial Managed Care - PPO | Admitting: Hematology and Oncology

## 2022-08-05 NOTE — Progress Notes (Signed)
Headache rest HEMATOLOGY-ONCOLOGY TELEPHONE VISIT PROGRESS NOTE  I connected with our patient on 08/09/22 at 11:30 AM EST by telephone and verified that I am speaking with the correct person using two identifiers.  I discussed the limitations, risks, security and privacy concerns of performing an evaluation and management service by telephone and the availability of in person appointments.  I also discussed with the patient that there may be a patient responsible charge related to this service. The patient expressed understanding and agreed to proceed.   History of Present Illness: Krystal [name] is a 56 y.o. with above-mentioned history of metastatic breast cancer. .   Oncology History  Cancer of central portion of left female breast (HCC)  04/02/2014 Genetic Testing   BRCA1 c.68_69delAG pathogenic mutation identified on gene sequce and del/dup analyses of BRCA1 and BRCA2.  BRCA1 p.H1283R VUS identified as well.  The report date is 04/02/2014.  UPDATE: BRCA1 H.Q4696E VUS has been reclassified as a likely benign variant.  The reclassification date is 04/21/2020.   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 N0 M0 stage IA: Oncotype DX 13 (ROR 9%)   04/16/2014 Surgery   Left upper back melanoma resected no residual melanoma identified on re-resection margins negative   05/26/2014 - 06/08/2015 Anti-estrogen oral therapy   Tamoxifen 20 mg daily with a plan to switch her to aromatase inhibitors once she gets oophorectomy ( patient did not continue antiestrogen therapy by choice)   08/13/2014 Surgery   oophorectomy with total abdominal hysterectomy   09/08/2015 -  Anti-estrogen oral therapy   Resumed anastrozole after she found recurrence of breast cancer; stopped in January 2018, started letrozole 2.5 mg daily 10/12/2016; switched to  Exemestane 25 mg daily on 01/03/17   09/16/2015 Surgery   Skin, left: IDC grade 3; 0.5 cm, margins negative, ER 95%, PR 10%, HER-2 positive ratio 2.08   11/08/2015 - 02/2017 Chemotherapy   Herceptin every 3 weeks plus anastrozole   11/24/2015 - 01/14/2016 Radiation Therapy   Adjuvant radiation therapy by Dr. Roselind Messier   01/2017 -  Anti-estrogen oral therapy   Exemestane daily (stopped others due to side effects), due to muscle aches and pains switch to tamoxifen 5 mg daily starting 12/18/2017   08/07/2019 Relapse/Recurrence   Bone scan: Sclerotic metastatic lesions involving left femoral neck, right ninth rib and entire L5 vertebra; MRI left hip: Diffuse signal abnormality L5 vertebral body with possible extraosseous extension   08/22/2019 - 09/03/2019 Radiation Therapy   Palliative radiation with Dr. Roselind Messier to L5 and left proixmal femur.      REVIEW OF SYSTEMS:   Constitutional: Denies fevers, chills or abnormal weight loss All other systems were reviewed with the patient and are negative. Observations/Objective:     Assessment Plan:  Cancer of central portion of left female breast (HCC) Bone scan 08/07/2019: Increase activity with sclerotic bone metastases involving left femoral neck right ninth rib and entire L5 vertebra   08/22/2019: PET CT scan: Metastatic disease in the chest with multiple lung nodules all subcentimeter size and hypermetabolic lymph nodes (right paratracheal node 7 mm, subcarinal node 1 cm, right hilar node 6 mm), retroperitoneal and upper pelvic common iliac lymph nodes, bone metastases especially L5, L4, T6, right 10th rib, left proximal femur   Bone biopsy was not felt to be reliable in addition to the effect of radiation  on the bone. Palliative radiation completed 09/03/2019 Guardant 360: BRCA1 mutation, TMB 5.36, MSI: Normal   Treatment plan: Ibrance with letrozole and Xgeva for bone metastases switched to abemaciclib   Bone metastasis: Patient does not want to take  bisphosphonates.    CT CAP 09/23/20: Slight int enl of lingular and LLL lung nodules, Stable bone mets CT CAP 04/04/2021: Increase in size of lymph nodes subcarinal lymph node 1.2 cm (previously 6 mm) right lower paratracheal lymph node 6 mm, interval enlargement of lingular 9 mm (previously 5 mm) and left lower lobe lung nodules 6 mm previously 4 mm, stable bone metastases right lateral rib, L5, S1  CT CAP 10/05/2021: Progressive sclerotic bone metastases, stable mediastinal and hilar nodes, increase in size of lingular and left lower lobe lung nodules CT CAP 01/10/2022: Stable bilateral pulmonary nodules, stable thickening left adrenal gland, no liver or peritoneal metastasis   Abemaciclib toxicities: 1.  Intermittent diarrhea  2. mild fatigue   Because of work-related issues, she could not do the CT scans.  Therefore we will request the CT scans to be done next week. I will call her 2 Days later to discuss results of the scans. She would like to move the injection appointment from tomorrow to next week as well.      I discussed the assessment and treatment plan with the patient. The patient was provided an opportunity to ask questions and all were answered. The patient agreed with the plan and demonstrated an understanding of the instructions. The patient was advised to call back or seek an in-person evaluation if the symptoms worsen or if the condition fails to improve as anticipated.   I provided 12 minutes of non-face-to-face time during this encounter.  This includes time for charting and coordination of care   Tamsen Meek, MD  I Janan Ridge am acting as a scribe for Dr.Tehran Rabenold  I have reviewed the above documentation for accuracy and completeness, and I agree with the above.

## 2022-08-09 ENCOUNTER — Inpatient Hospital Stay: Payer: Commercial Managed Care - PPO | Attending: Hematology and Oncology | Admitting: Hematology and Oncology

## 2022-08-09 DIAGNOSIS — C7951 Secondary malignant neoplasm of bone: Secondary | ICD-10-CM | POA: Insufficient documentation

## 2022-08-09 DIAGNOSIS — Z79818 Long term (current) use of other agents affecting estrogen receptors and estrogen levels: Secondary | ICD-10-CM | POA: Insufficient documentation

## 2022-08-09 DIAGNOSIS — Z170 Estrogen receptor positive status [ER+]: Secondary | ICD-10-CM | POA: Diagnosis not present

## 2022-08-09 DIAGNOSIS — C50012 Malignant neoplasm of nipple and areola, left female breast: Secondary | ICD-10-CM | POA: Insufficient documentation

## 2022-08-09 DIAGNOSIS — Z9013 Acquired absence of bilateral breasts and nipples: Secondary | ICD-10-CM | POA: Insufficient documentation

## 2022-08-09 DIAGNOSIS — C50112 Malignant neoplasm of central portion of left female breast: Secondary | ICD-10-CM | POA: Diagnosis not present

## 2022-08-09 DIAGNOSIS — Z923 Personal history of irradiation: Secondary | ICD-10-CM | POA: Insufficient documentation

## 2022-08-09 NOTE — Assessment & Plan Note (Signed)
Bone scan 08/07/2019: Increase activity with sclerotic bone metastases involving left femoral neck right ninth rib and entire L5 vertebra   08/22/2019: PET CT scan: Metastatic disease in the chest with multiple lung nodules all subcentimeter size and hypermetabolic lymph nodes (right paratracheal node 7 mm, subcarinal node 1 cm, right hilar node 6 mm), retroperitoneal and upper pelvic common iliac lymph nodes, bone metastases especially L5, L4, T6, right 10th rib, left proximal femur   Bone biopsy was not felt to be reliable in addition to the effect of radiation on the bone. Palliative radiation completed 09/03/2019 Guardant 360: BRCA1 mutation, TMB 5.36, MSI: Normal   Treatment plan: Ibrance with letrozole and Xgeva for bone metastases switched to abemaciclib   Bone metastasis: Patient does not want to take bisphosphonates.    CT CAP 09/23/20: Slight int enl of lingular and LLL lung nodules, Stable bone mets CT CAP 04/04/2021: Increase in size of lymph nodes subcarinal lymph node 1.2 cm (previously 6 mm) right lower paratracheal lymph node 6 mm, interval enlargement of lingular 9 mm (previously 5 mm) and left lower lobe lung nodules 6 mm previously 4 mm, stable bone metastases right lateral rib, L5, S1  CT CAP 10/05/2021: Progressive sclerotic bone metastases, stable mediastinal and hilar nodes, increase in size of lingular and left lower lobe lung nodules CT CAP 01/10/2022: Stable bilateral pulmonary nodules, stable thickening left adrenal gland, no liver or peritoneal metastasis   Abemaciclib toxicities: 1.  Intermittent diarrhea  2. mild fatigue   Scans have not been done

## 2022-08-10 ENCOUNTER — Inpatient Hospital Stay: Payer: Commercial Managed Care - PPO

## 2022-08-10 NOTE — Progress Notes (Incomplete)
HEMATOLOGY-ONCOLOGY TELEPHONE VISIT PROGRESS NOTE  I connected with our patient on 08/10/22 at  9:30 AM EST by telephone and verified that I am speaking with the correct person using two identifiers.  I discussed the limitations, risks, security and privacy concerns of performing an evaluation and management service by telephone and the availability of in person appointments.  I also discussed with the patient that there may be a patient responsible charge related to this service. The patient expressed understanding and agreed to proceed.   History of Present Illness: Krystal [name] is a 57 y.o. with above-mentioned history of metastatic breast cancer. She presents to the clinic for a via phone follow-up.   Oncology History  Cancer of central portion of left female breast ([place])  04/02/2014 Genetic Testing   BRCA1 c.68_69delAG pathogenic mutation identified on gene sequce and del/dup analyses of BRCA1 and BRCA2.  BRCA1 p.H1283R VUS identified as well.  The report date is 04/02/2014.  UPDATE: BRCA1 Y.W7371G VUS has been reclassified as a likely benign variant.  The reclassification date is 04/21/2020.   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 N0 M0 stage IA: Oncotype DX 13 (ROR 9%)   04/16/2014 Surgery   Left upper back melanoma resected no residual melanoma identified on re-resection margins negative   05/26/2014 - 06/08/2015 Anti-estrogen oral therapy   Tamoxifen 20 mg daily with a plan to switch her to aromatase inhibitors once she gets oophorectomy ( patient did not continue antiestrogen therapy by choice)   08/13/2014 Surgery   oophorectomy with total abdominal hysterectomy   09/08/2015 -  Anti-estrogen oral therapy   Resumed anastrozole after she found recurrence of breast cancer; stopped in January 2018, started  letrozole 2.5 mg daily 10/12/2016; switched to Exemestane 25 mg daily on 01/03/17   09/16/2015 Surgery   Skin, left: IDC grade 3; 0.5 cm, margins negative, ER 95%, PR 10%, HER-2 positive ratio 2.08   11/08/2015 - 02/2017 Chemotherapy   Herceptin every 3 weeks plus anastrozole   11/24/2015 - 01/14/2016 Radiation Therapy   Adjuvant radiation therapy by Dr. Sondra Come   01/2017 -  Anti-estrogen oral therapy   Exemestane daily (stopped others due to side effects), due to muscle aches and pains switch to tamoxifen 5 mg daily starting 12/18/2017   08/07/2019 Relapse/Recurrence   Bone scan: Sclerotic metastatic lesions involving left femoral neck, right ninth rib and entire L5 vertebra; MRI left hip: Diffuse signal abnormality L5 vertebral body with possible extraosseous extension   08/22/2019 - 09/03/2019 Radiation Therapy   Palliative radiation with Dr. Sondra Come to L5 and left proixmal femur.      REVIEW OF SYSTEMS:   Constitutional: Denies fevers, chills or abnormal weight loss All other systems were reviewed with the patient and are negative. Observations/Objective:     Assessment Plan:  No problem-specific Assessment & Plan notes found for this encounter.    I discussed the assessment and treatment plan with the patient. The patient was provided an opportunity to ask questions and all were answered. The patient agreed with the plan and demonstrated an understanding of the instructions. The patient was advised to call back or seek an in-person evaluation if the symptoms worsen or if the condition fails to improve as anticipated.   I provided *** minutes of non-face-to-face time during this encounter.  This includes time for charting and coordination  of care   Gould, CMA  I Deritra, [place] am acting as a Education administrator for [employer]  ***

## 2022-08-11 ENCOUNTER — Other Ambulatory Visit: Payer: Self-pay

## 2022-08-11 ENCOUNTER — Inpatient Hospital Stay: Payer: Commercial Managed Care - PPO

## 2022-08-11 VITALS — BP 141/87 | HR 94 | Temp 98.9°F | Resp 18

## 2022-08-11 DIAGNOSIS — C50919 Malignant neoplasm of unspecified site of unspecified female breast: Secondary | ICD-10-CM

## 2022-08-11 DIAGNOSIS — C50012 Malignant neoplasm of nipple and areola, left female breast: Secondary | ICD-10-CM | POA: Diagnosis not present

## 2022-08-11 DIAGNOSIS — C7951 Secondary malignant neoplasm of bone: Secondary | ICD-10-CM

## 2022-08-11 DIAGNOSIS — Z170 Estrogen receptor positive status [ER+]: Secondary | ICD-10-CM | POA: Diagnosis not present

## 2022-08-11 DIAGNOSIS — Z923 Personal history of irradiation: Secondary | ICD-10-CM | POA: Diagnosis not present

## 2022-08-11 DIAGNOSIS — Z79818 Long term (current) use of other agents affecting estrogen receptors and estrogen levels: Secondary | ICD-10-CM | POA: Diagnosis not present

## 2022-08-11 DIAGNOSIS — Z9013 Acquired absence of bilateral breasts and nipples: Secondary | ICD-10-CM | POA: Diagnosis not present

## 2022-08-11 MED ORDER — FULVESTRANT 250 MG/5ML IM SOSY
500.0000 mg | PREFILLED_SYRINGE | Freq: Once | INTRAMUSCULAR | Status: AC
  Administered 2022-08-11: 500 mg via INTRAMUSCULAR
  Filled 2022-08-11: qty 10

## 2022-08-16 ENCOUNTER — Ambulatory Visit (HOSPITAL_COMMUNITY)
Admission: RE | Admit: 2022-08-16 | Discharge: 2022-08-16 | Disposition: A | Discharge status: Home / Self Care | Payer: Commercial Managed Care - PPO | Source: Ambulatory Visit | Attending: Hematology and Oncology | Admitting: Hematology and Oncology

## 2022-08-16 DIAGNOSIS — C7951 Secondary malignant neoplasm of bone: Secondary | ICD-10-CM

## 2022-08-16 DIAGNOSIS — Z170 Estrogen receptor positive status [ER+]: Secondary | ICD-10-CM

## 2022-08-16 NOTE — Progress Notes (Signed)
Patient arrived at 5:15 for her CT scan appointment at 5:30.  Patient upset that admitting was closed and refused to walk over to the Emergency department to register for her CT scan.  I did tell patient she would not be registering as an Emergency room patient they just need to register her for CT scan. The front desk offered to call the shuttle to come pick her up and take her over there and she refused.  She also refused to walk over to the ED because valet was closed and would not park her car and that she had to walk from the parking lot.  This technologist heard her yelling at the front desk staff over the phone and I asked to speak with her.  Per patient "this was our fault we should have told her admitting closed at 5, when the appointment was schedule and she had called our department early today we did not tell then either."  She continues to yell at staff and was verbally abusive to the front desk staff.  I ask her to give about 10 minutes to see if I could get someone from the ED to come her to register her.  I proceed to go to MRI to check the patient in myself and print an armband.  When I arrived in the CT department the front desk called to tell us that the patient "said she was leaving and this was her 6 month follow up and this was our fault that she did not get her CT scan, and she planned on tell her doctor"

## 2022-08-17 ENCOUNTER — Inpatient Hospital Stay: Payer: Commercial Managed Care - PPO | Admitting: Pharmacist

## 2022-08-17 NOTE — Progress Notes (Signed)
[facility]       Telephone: [phone]?Fax: [phone]   Oncology Clinical Pharmacist Practitioner Progress Note  Krystal [name] is a 57 y.o. female with a diagnosis of metastatic breast cancer currently on abemaciclib + fulvestrant under the care of Dr. [name].   I connected with Krystal [name] today by telephone and verified that I was speaking with the correct person using two patient identifiers.   Other persons participating in the visit and their role in the encounter: none   Patient's location: home  Provider's location: clinic  Krystal [name] was contacted by clinical pharmacy today after receiving a voicemail message regarding a cough that she has had for about 1 week.  She reports that the cough is dry in nature and seems to come on from being outside in the cold weather or in her office where they have been using heat to keep the area warm.  She is not reporting any difficulty breathing, shortness of breath, or fevers.  We discussed that it could likely be due to therapy and dry and mentioned that she could consider using a humidifier or over-the-counter cough medicine/lozenges for relief.  Clinical pharmacy discussed that she will have a CT CAP with contrast on 08/23/22 which will likely pick up any concerns as well.  Krystal [name] knows to contact Dr. Geralyn Flash clinic prior to that exam if the symptoms should worsen or any new side effects should arise.  She has a follow-up telephone call with Dr. Lindi Adie on 08/24/2022 to review the results and will next follow-up with clinical pharmacy with labs on 09/14/22.  Did review with Krystal [name] that her fulvestrant injection was last given on 08/11/22 and that it is due again on 09/08/22.  However, Krystal [name] states that she prefers to have the injection on 2/8/2 for due to her work and the first few days of the month being very busy.  Krystal [name] participated in the discussion, expressed understanding, and voiced agreement with the above plan.  All questions were answered to her satisfaction. The patient was advised to contact the clinic at [phone] with any questions or concerns prior to her return visit.  Clinical pharmacy will continue to support Krystal [name] and Dr. [name] as needed.  [name], RPH-CPP,  08/17/2022  12:09 PM   **Disclaimer: This note was dictated with voice recognition software. Similar sounding words can inadvertently be transcribed and this note may contain transcription errors which may not have been corrected upon publication of note.**

## 2022-08-18 ENCOUNTER — Inpatient Hospital Stay: Payer: Commercial Managed Care - PPO | Admitting: Hematology and Oncology

## 2022-08-22 ENCOUNTER — Other Ambulatory Visit (HOSPITAL_COMMUNITY): Payer: Commercial Managed Care - PPO

## 2022-08-23 ENCOUNTER — Ambulatory Visit (HOSPITAL_COMMUNITY)
Admission: RE | Admit: 2022-08-23 | Discharge: 2022-08-23 | Disposition: A | Discharge status: Home / Self Care | Payer: Commercial Managed Care - PPO | Source: Ambulatory Visit | Attending: Hematology and Oncology | Admitting: Hematology and Oncology

## 2022-08-23 DIAGNOSIS — C7951 Secondary malignant neoplasm of bone: Secondary | ICD-10-CM | POA: Diagnosis present

## 2022-08-23 DIAGNOSIS — Z170 Estrogen receptor positive status [ER+]: Secondary | ICD-10-CM | POA: Diagnosis present

## 2022-08-23 DIAGNOSIS — C50112 Malignant neoplasm of central portion of left female breast: Secondary | ICD-10-CM | POA: Insufficient documentation

## 2022-08-23 LAB — POCT I-STAT CREATININE: Creatinine, Ser: 0.9 mg/dL (ref 0.44–1.00)

## 2022-08-23 MED ORDER — IOHEXOL 300 MG/ML  SOLN
100.0000 mL | Freq: Once | INTRAMUSCULAR | Status: AC | PRN
  Administered 2022-08-23: 100 mL via INTRAVENOUS

## 2022-08-24 ENCOUNTER — Other Ambulatory Visit: Payer: Self-pay | Admitting: Hematology and Oncology

## 2022-08-24 ENCOUNTER — Inpatient Hospital Stay (HOSPITAL_BASED_OUTPATIENT_CLINIC_OR_DEPARTMENT_OTHER): Payer: Commercial Managed Care - PPO | Admitting: Hematology and Oncology

## 2022-08-24 DIAGNOSIS — C50919 Malignant neoplasm of unspecified site of unspecified female breast: Secondary | ICD-10-CM | POA: Diagnosis not present

## 2022-08-24 DIAGNOSIS — C7951 Secondary malignant neoplasm of bone: Secondary | ICD-10-CM | POA: Diagnosis not present

## 2022-08-24 NOTE — Assessment & Plan Note (Addendum)
Bone scan 08/07/2019: Increase activity with sclerotic bone metastases involving left femoral neck right ninth rib and entire L5 vertebra   08/22/2019: PET CT scan: Metastatic disease in the chest with multiple lung nodules all subcentimeter size and hypermetabolic lymph nodes (right paratracheal node 7 mm, subcarinal node 1 cm, right hilar node 6 mm), retroperitoneal and upper pelvic common iliac lymph nodes, bone metastases especially L5, L4, T6, right 10th rib, left proximal femur   Bone biopsy was not felt to be reliable in addition to the effect of radiation on the bone. Palliative radiation completed 09/03/2019 Guardant 360: BRCA1 mutation, TMB 5.36, MSI: Normal   Treatment plan: Ibrance with letrozole and Xgeva for bone metastases switched to abemaciclib   Bone metastasis: Patient does not want to take bisphosphonates.    CT CAP 09/23/20: Slight int enl of lingular and LLL lung nodules, Stable bone mets CT CAP 04/04/2021: Increase in size of lymph nodes subcarinal lymph node 1.2 cm (previously 6 mm) right lower paratracheal lymph node 6 mm, interval enlargement of lingular 9 mm (previously 5 mm) and left lower lobe lung nodules 6 mm previously 4 mm, stable bone metastases right lateral rib, L5, S1  CT CAP 10/05/2021: Progressive sclerotic bone metastases, stable mediastinal and hilar nodes, increase in size of lingular and left lower lobe lung nodules CT CAP 01/10/2022: Stable bilateral pulmonary nodules, stable thickening left adrenal gland, no liver or peritoneal metastasis CT CAP 08/23/2022: New and enlarged pulmonary nodule consistent with progression.  Enlarged small low cervical and mediastinal nodes.  Multiple bone metastases minimally progressive.  Liver lesions evaluation is challenging because of hepatic steatosis with suspected liver progression, isolated tiny omental nodule   Radiology review: I discussed with the patient that there appears to be progression of disease based upon the  results of the CT scans.  Treatment options: Guardant360: To look for PIK3CA mutation Switch from letrozole to Faslodex Participation in [place] 1 clinical trial using [employer]

## 2022-08-24 NOTE — Progress Notes (Signed)
HEMATOLOGY-ONCOLOGY TELEPHONE VISIT PROGRESS NOTE  I connected with our patient on 08/24/22 at  1:30 PM EST by telephone and verified that I am speaking with the correct person using two identifiers.  I discussed the limitations, risks, security and privacy concerns of performing an evaluation and management service by telephone and the availability of in person appointments.  I also discussed with the patient that there may be a patient responsible charge related to this service. The patient expressed understanding and agreed to proceed.   History of Present Illness: Krystal [name] is a 57 y.o. with a history of metastatic breast cancer with metastases osseous . She reports to the clinic today for telephone visit to review scans.  Oncology History  Cancer of central portion of left female breast ([place])  04/02/2014 Genetic Testing   BRCA1 c.68_69delAG pathogenic mutation identified on gene sequce and del/dup analyses of BRCA1 and BRCA2.  BRCA1 p.H1283R VUS identified as well.  The report date is 04/02/2014.  UPDATE: BRCA1 J.G2836O VUS has been reclassified as a likely benign variant.  The reclassification date is 04/21/2020.   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 N0 M0 stage IA: Oncotype DX 13 (ROR 9%)   04/16/2014 Surgery   Left upper back melanoma resected no residual melanoma identified on re-resection margins negative   05/26/2014 - 06/08/2015 Anti-estrogen oral therapy   Tamoxifen 20 mg daily with a plan to switch her to aromatase inhibitors once she gets oophorectomy ( patient did not continue antiestrogen therapy by choice)   08/13/2014 Surgery   oophorectomy with total abdominal hysterectomy   09/08/2015 -  Anti-estrogen oral therapy   Resumed anastrozole after she found recurrence of breast cancer; stopped in  January 2018, started letrozole 2.5 mg daily 10/12/2016; switched to Exemestane 25 mg daily on 01/03/17   09/16/2015 Surgery   Skin, left: IDC grade 3; 0.5 cm, margins negative, ER 95%, PR 10%, HER-2 positive ratio 2.08   11/08/2015 - 02/2017 Chemotherapy   Herceptin every 3 weeks plus anastrozole   11/24/2015 - 01/14/2016 Radiation Therapy   Adjuvant radiation therapy by Dr. Sondra Come   01/2017 -  Anti-estrogen oral therapy   Exemestane daily (stopped others due to side effects), due to muscle aches and pains switch to tamoxifen 5 mg daily starting 12/18/2017   08/07/2019 Relapse/Recurrence   Bone scan: Sclerotic metastatic lesions involving left femoral neck, right ninth rib and entire L5 vertebra; MRI left hip: Diffuse signal abnormality L5 vertebral body with possible extraosseous extension   08/22/2019 - 09/03/2019 Radiation Therapy   Palliative radiation with Dr. Sondra Come to L5 and left proixmal femur.      REVIEW OF SYSTEMS:   Constitutional: Denies fevers, chills or abnormal weight loss All other systems were reviewed with the patient and are negative. Observations/Objective:     Assessment Plan:  Primary malignant neoplasm of breast with metastasis ([place]) Bone scan 08/07/2019: Increase activity with sclerotic bone metastases involving left femoral neck right ninth rib and entire L5 vertebra   08/22/2019: PET CT scan: Metastatic disease in the chest with multiple lung nodules all subcentimeter size and hypermetabolic lymph nodes (right paratracheal node 7 mm, subcarinal node 1 cm, right hilar node 6 mm), retroperitoneal and upper pelvic common iliac lymph nodes, bone metastases especially L5, L4, T6, right 10th rib, left proximal femur   Bone biopsy was  not felt to be reliable in addition to the effect of radiation on the bone. Palliative radiation completed 09/03/2019 Guardant 360: BRCA1 mutation, TMB 5.36, MSI: Normal   Treatment plan: Ibrance with letrozole and Xgeva for bone metastases  switched to abemaciclib and Fulvestrant   Bone metastasis: Patient does not want to take bisphosphonates.    CT CAP 09/23/20: Slight int enl of lingular and LLL lung nodules, Stable bone mets CT CAP 04/04/2021: Increase in size of lymph nodes subcarinal lymph node 1.2 cm (previously 6 mm) right lower paratracheal lymph node 6 mm, interval enlargement of lingular 9 mm (previously 5 mm) and left lower lobe lung nodules 6 mm previously 4 mm, stable bone metastases right lateral rib, L5, S1  CT CAP 10/05/2021: Progressive sclerotic bone metastases, stable mediastinal and hilar nodes, increase in size of lingular and left lower lobe lung nodules CT CAP 01/10/2022: Stable bilateral pulmonary nodules, stable thickening left adrenal gland, no liver or peritoneal metastasis CT CAP 08/23/2022: New and enlarged pulmonary nodule consistent with progression.  Enlarged small low cervical and mediastinal nodes.  Multiple bone metastases minimally progressive.  Liver lesions evaluation is challenging because of hepatic steatosis with suspected liver progression, isolated tiny omental nodule   Radiology review: I discussed with the patient that there appears to be progression of disease based upon the results of the CT scans.  Treatment options: Discussed with radiology failure liver biopsy can be performed if not we can do Guardant360. Participation in [place] 1 clinical trial using [employer]  Based on the biopsy results we can come up with the treatment plan.  Molecular testing and breast prognostic panel will need to be performed on the biopsy material. I called radiology and discussed with Dr. Pascal Lux.  He recommended that we obtain a PET CT scan and call them so that they can focus on obtaining the correct area for the biopsy.  Patient is agreeable to get the PET scan.   I discussed the assessment and treatment plan with the patient. The patient was provided an opportunity to ask questions and all were answered.  The patient agreed with the plan and demonstrated an understanding of the instructions. The patient was advised to call back or seek an in-person evaluation if the symptoms worsen or if the condition fails to improve as anticipated.   I provided 12 minutes of non-face-to-face time during this encounter.  This includes time for charting and coordination of care   Harriette Ohara, MD  I Gardiner Coins am acting as a scribe for Dr.Srihan Brutus  I have reviewed the above documentation for accuracy and completeness, and I agree with the above.

## 2022-09-07 ENCOUNTER — Ambulatory Visit (HOSPITAL_COMMUNITY)
Admission: RE | Admit: 2022-09-07 | Discharge: 2022-09-07 | Disposition: A | Discharge status: Home / Self Care | Payer: Commercial Managed Care - PPO | Source: Ambulatory Visit | Attending: Hematology and Oncology | Admitting: Hematology and Oncology

## 2022-09-07 DIAGNOSIS — C50919 Malignant neoplasm of unspecified site of unspecified female breast: Secondary | ICD-10-CM

## 2022-09-07 DIAGNOSIS — C7951 Secondary malignant neoplasm of bone: Secondary | ICD-10-CM | POA: Diagnosis present

## 2022-09-07 LAB — GLUCOSE, CAPILLARY: Glucose-Capillary: 106 mg/dL — ABNORMAL HIGH (ref 70–99)

## 2022-09-07 MED ORDER — FLUDEOXYGLUCOSE F - 18 (FDG) INJECTION
11.4000 | Freq: Once | INTRAVENOUS | Status: AC | PRN
  Administered 2022-09-07: 11.3 via INTRAVENOUS

## 2022-09-11 ENCOUNTER — Other Ambulatory Visit (HOSPITAL_COMMUNITY): Payer: Self-pay

## 2022-09-11 ENCOUNTER — Telehealth: Payer: Self-pay | Admitting: Pharmacy Technician

## 2022-09-11 NOTE — Telephone Encounter (Signed)
Oral Oncology Patient Advocate Encounter   Received notification that prior authorization for Verzenio is due for renewal.   PA submitted on 09/11/22 Key [id number] Status is pending     Lady Deutscher, [place] Patient [place] Direct Number: [phone]  Fax: [phone]

## 2022-09-11 NOTE — Telephone Encounter (Signed)
Oral Oncology Patient Advocate Encounter  Prior Authorization for Krystal [name] has been approved.    PA# [id number] Effective dates: 08/12/22 through 09/11/23  Patient may continue to fill as usual.    Lady Deutscher, CPhT-Adv Oncology Pharmacy Patient [place] Direct Number: [phone]  Fax: [phone]

## 2022-09-14 ENCOUNTER — Encounter: Payer: Self-pay | Admitting: Adult Health

## 2022-09-14 ENCOUNTER — Inpatient Hospital Stay: Payer: Commercial Managed Care - PPO | Admitting: Adult Health

## 2022-09-14 ENCOUNTER — Inpatient Hospital Stay: Payer: Commercial Managed Care - PPO

## 2022-09-14 ENCOUNTER — Other Ambulatory Visit: Payer: Self-pay

## 2022-09-14 ENCOUNTER — Inpatient Hospital Stay: Payer: Commercial Managed Care - PPO | Admitting: Pharmacist

## 2022-09-14 ENCOUNTER — Inpatient Hospital Stay: Payer: Commercial Managed Care - PPO | Attending: Hematology and Oncology

## 2022-09-14 VITALS — BP 121/80 | HR 83 | Temp 96.8°F | Resp 18 | Wt 228.1 lb

## 2022-09-14 DIAGNOSIS — Z803 Family history of malignant neoplasm of breast: Secondary | ICD-10-CM | POA: Insufficient documentation

## 2022-09-14 DIAGNOSIS — C7951 Secondary malignant neoplasm of bone: Secondary | ICD-10-CM | POA: Insufficient documentation

## 2022-09-14 DIAGNOSIS — Z1509 Genetic susceptibility to other malignant neoplasm: Secondary | ICD-10-CM | POA: Insufficient documentation

## 2022-09-14 DIAGNOSIS — Z923 Personal history of irradiation: Secondary | ICD-10-CM | POA: Diagnosis not present

## 2022-09-14 DIAGNOSIS — Z79811 Long term (current) use of aromatase inhibitors: Secondary | ICD-10-CM | POA: Insufficient documentation

## 2022-09-14 DIAGNOSIS — R0609 Other forms of dyspnea: Secondary | ICD-10-CM | POA: Insufficient documentation

## 2022-09-14 DIAGNOSIS — C50112 Malignant neoplasm of central portion of left female breast: Secondary | ICD-10-CM

## 2022-09-14 DIAGNOSIS — Z1501 Genetic susceptibility to malignant neoplasm of breast: Secondary | ICD-10-CM | POA: Insufficient documentation

## 2022-09-14 DIAGNOSIS — K760 Fatty (change of) liver, not elsewhere classified: Secondary | ICD-10-CM | POA: Diagnosis not present

## 2022-09-14 DIAGNOSIS — K219 Gastro-esophageal reflux disease without esophagitis: Secondary | ICD-10-CM | POA: Insufficient documentation

## 2022-09-14 DIAGNOSIS — E119 Type 2 diabetes mellitus without complications: Secondary | ICD-10-CM | POA: Diagnosis not present

## 2022-09-14 DIAGNOSIS — F17210 Nicotine dependence, cigarettes, uncomplicated: Secondary | ICD-10-CM | POA: Insufficient documentation

## 2022-09-14 DIAGNOSIS — I517 Cardiomegaly: Secondary | ICD-10-CM | POA: Diagnosis not present

## 2022-09-14 DIAGNOSIS — I700 Atherosclerosis of aorta: Secondary | ICD-10-CM | POA: Diagnosis not present

## 2022-09-14 DIAGNOSIS — C4359 Malignant melanoma of other part of trunk: Secondary | ICD-10-CM | POA: Insufficient documentation

## 2022-09-14 DIAGNOSIS — Z170 Estrogen receptor positive status [ER+]: Secondary | ICD-10-CM

## 2022-09-14 DIAGNOSIS — G8929 Other chronic pain: Secondary | ICD-10-CM | POA: Insufficient documentation

## 2022-09-14 DIAGNOSIS — Z9221 Personal history of antineoplastic chemotherapy: Secondary | ICD-10-CM | POA: Insufficient documentation

## 2022-09-14 DIAGNOSIS — C50919 Malignant neoplasm of unspecified site of unspecified female breast: Secondary | ICD-10-CM

## 2022-09-14 LAB — CBC WITH DIFFERENTIAL (CANCER CENTER ONLY)
Abs Immature Granulocytes: 0 10*3/uL (ref 0.00–0.07)
Basophils Absolute: 0.1 10*3/uL (ref 0.0–0.1)
Basophils Relative: 2 %
Eosinophils Absolute: 0.1 10*3/uL (ref 0.0–0.5)
Eosinophils Relative: 5 %
HCT: 36.3 % (ref 36.0–46.0)
Hemoglobin: 12.4 g/dL (ref 12.0–15.0)
Immature Granulocytes: 0 %
Lymphocytes Relative: 26 %
Lymphs Abs: 0.7 10*3/uL (ref 0.7–4.0)
MCH: 33.7 pg (ref 26.0–34.0)
MCHC: 34.2 g/dL (ref 30.0–36.0)
MCV: 98.6 fL (ref 80.0–100.0)
Monocytes Absolute: 0.2 10*3/uL (ref 0.1–1.0)
Monocytes Relative: 7 %
Neutro Abs: 1.6 10*3/uL — ABNORMAL LOW (ref 1.7–7.7)
Neutrophils Relative %: 60 %
Platelet Count: 198 10*3/uL (ref 150–400)
RBC: 3.68 MIL/uL — ABNORMAL LOW (ref 3.87–5.11)
RDW: 12.4 % (ref 11.5–15.5)
WBC Count: 2.7 10*3/uL — ABNORMAL LOW (ref 4.0–10.5)
nRBC: 0 % (ref 0.0–0.2)

## 2022-09-14 LAB — CMP (CANCER CENTER ONLY)
ALT: 32 U/L (ref 0–44)
AST: 26 U/L (ref 15–41)
Albumin: 4 g/dL (ref 3.5–5.0)
Alkaline Phosphatase: 67 U/L (ref 38–126)
Anion gap: 6 (ref 5–15)
BUN: 15 mg/dL (ref 6–20)
CO2: 32 mmol/L (ref 22–32)
Calcium: 9.4 mg/dL (ref 8.9–10.3)
Chloride: 103 mmol/L (ref 98–111)
Creatinine: 0.92 mg/dL (ref 0.44–1.00)
GFR, Estimated: >60 mL/min (ref >60)
Glucose, Bld: 97 mg/dL (ref 70–99)
Potassium: 4.2 mmol/L (ref 3.5–5.1)
Sodium: 141 mmol/L (ref 135–145)
Total Bilirubin: 0.6 mg/dL (ref 0.3–1.2)
Total Protein: 6.6 g/dL (ref 6.5–8.1)

## 2022-09-14 NOTE — Assessment & Plan Note (Signed)
Krystal [name] is a 57 year old woman with metastatic breast cancer here today for f/u and discussion of her PET scan results.    I reviewed her PET scan results with her which demonstrate progression in the nodes, lung, and liver.  We need to obtain a liver biopsy for repeat pathology with prognostic details, and molecular testing to help guide future treatments.  I gave her a list of potential treatments, but these are all dependent on her testing results.  For now, she will not take anymore Fulvestrant and Verzenio.    We discussed her cardiac concern regarding the cardiomegaly and LAD artery calcification.  I placed a referral for her to see Dr. Haroldine Laws and sent him and his nurse a message about this.    We will await Haeli's ultrasound guided biopsy.  She will return in 2 weeks for f/u with Dr. Lindi Adie to discuss her next steps.

## 2022-09-14 NOTE — Progress Notes (Signed)
[place] [facility] Follow up:    Patient, [name] No address on file   DIAGNOSIS:  Cancer Staging  Cancer of central portion of left female breast [facility]) Staging form: Breast, AJCC 7th Edition - Clinical: No stage assigned - Unsigned Laterality: Right - Pathologic: Stage IA (T1c, N0, cM0) - Signed by Rulon Eisenmenger, MD on 05/12/2014 Laterality: Right   SUMMARY OF ONCOLOGIC HISTORY: Oncology History  Cancer of central portion of left female breast ([place])  04/02/2014 Genetic Testing   BRCA1 [id number] pathogenic mutation identified on gene sequce and del/dup analyses of BRCA1 and BRCA2.  BRCA1 p.[id number] VUS identified as well.  The report date is 04/02/2014.  UPDATE: BRCA1 R.[id number] VUS has been reclassified as a likely benign variant.  The reclassification date is 04/21/2020.   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 N0 M0 stage IA: Oncotype DX 13 (ROR 9%)   04/16/2014 Surgery   Left upper back melanoma resected no residual melanoma identified on re-resection margins negative   05/26/2014 - 06/08/2015 Anti-estrogen oral therapy   Tamoxifen 20 mg daily with a plan to switch her to aromatase inhibitors once she gets oophorectomy ( patient did not continue antiestrogen therapy by choice)   08/13/2014 Surgery   oophorectomy with total abdominal hysterectomy   09/08/2015 -  Anti-estrogen oral therapy   Resumed anastrozole after she found recurrence of breast cancer; stopped in January 2018, started letrozole 2.5 mg daily 10/12/2016; switched to Exemestane 25 mg daily on 01/03/17   09/16/2015 Surgery   Skin, left: IDC grade 3; 0.5 cm, margins negative, ER 95%, PR 10%, HER-2 positive ratio 2.08   11/08/2015 - 02/2017 Chemotherapy   Herceptin every 3 weeks plus anastrozole   11/24/2015 - 01/14/2016 Radiation  Therapy   Adjuvant radiation therapy by Dr. Sondra Come   01/2017 -  Anti-estrogen oral therapy   Exemestane daily (stopped others due to side effects), due to muscle aches and pains switch to tamoxifen 5 mg daily starting 12/18/2017   08/07/2019 Relapse/Recurrence   Bone scan: Sclerotic metastatic lesions involving left femoral neck, right ninth rib and entire L5 vertebra; MRI left hip: Diffuse signal abnormality L5 vertebral body with possible extraosseous extension   08/22/2019 - 09/03/2019 Radiation Therapy   Palliative radiation with Dr. Sondra Come to L5 and left proixmal femur.    10/2021 Treatment Plan Change   Fulvestrant and Verzenio   08/23/2022 Imaging   CT CAP 08/23/2022: New and enlarged pulmonary nodule consistent with progression.  Enlarged small low cervical and mediastinal nodes.  Multiple bone metastases minimally progressive.  Liver lesions evaluation is challenging because of hepatic steatosis with suspected liver progression, isolated tiny omental nodule     09/07/2022 PET scan   IMPRESSION: 1. Worsening of disease now with nodal, pulmonary and hepatic metastases. 2. Areas of variable FDG uptake with some new bone lesions and some areas of bony metastasis which are more focal, for instance in the pelvis where there was likely diffuse involvement on prior imaging. Many of the areas of sclerosis do not show substantial uptake as outlined above and some areas show improvement/resolution of FDG uptake seen previously but there are signs of active bony metastatic disease currently. 3. Likely LEFT adrenal adenoma. 4. Severe hepatic steatosis. 5. Aortic atherosclerosis.     CURRENT THERAPY: Fulvestrant & Verzenio  INTERVAL HISTORY:  Krystal [name] 57 y.o. female returns for f/u and discussion of her PET scan results.  She is doing well today.  Her PET scan completed on 09/07/2022 shows worsening in her cancer with nodal, pulmonary, and hepatic metastases.  She let me know that she had  been experiencing a cough for about 3 months that she and Krystal [name] had attributed to the weather changes/allergies.  She has mild DOE, but otherwise is doing well.  She tells me that she has chronic pain in her ribs from the cancer that she is taking Meloxicam for.     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  MEDICAL HISTORY: Past Medical History:  Diagnosis Date   Arthritis    shoulders   BRCA1 positive    BRCA1 [id number]   Cancer ([place])    Chest wall mass 60/6301   Complication of anesthesia    respiratory depression/arrest with Dilaudid   Cough with sputum 09/10/2015   clear sputum, per pt.   Dental crowns present    Diabetes mellitus without complication ([place])    Family history of Lynch syndrome    Family history of pancreatic cancer    GERD (gastroesophageal reflux disease)    takes OTC meds   History of breast cancer 2015   Stuffy and runny nose 09/10/2015   green drainage from nose, per pt.    SURGICAL HISTORY: Past Surgical History:  Procedure Laterality Date   ABDOMINAL HYSTERECTOMY     BREAST LUMPECTOMY Left 09/16/2015   Procedure: LEFT BREAST MASS EXCISION;  Surgeon: Rolm Bookbinder, MD;  Location: [place];  Service: General;  Laterality: Left;   BREAST RECONSTRUCTION WITH PLACEMENT OF TISSUE EXPANDER AND FLEX HD (ACELLULAR HYDRATED DERMIS) Bilateral 04/16/2014   Procedure: BILATERAL BREAST RECONSTRUCTION WITH PLACEMENT OF TISSUE EXPANDER AND FLEX HD (ACELLULAR HYDRATED DERMIS);  Surgeon: Irene Limbo, MD;  Location: [place];  Service: Plastics;  Laterality: Bilateral;   CESAREAN SECTION  1999; 2001; 2005   [place] AND CLOSURE WOUND Bilateral 05/15/2014   Procedure: DEBRIDEMENT AND CLOSURE OF BILATERAL MASECTOMY INCISIONS WITH BREAST EXPANSION;  Surgeon: Irene Limbo, MD;  Location: [place];  Service: Plastics;  Laterality: Bilateral;   LAPAROSCOPIC ASSISTED VAGINAL HYSTERECTOMY N/A 08/13/2014   Procedure: OPEN LAPAROSCOPY, EXPLORATORY LAPAROTOMY, TOTAL ABDOMINAL HYSTERECTOMY WITH BILATERAL SALPINGECTOMY AND BILATERAL OOPHORECTOMY;  Surgeon: Darlyn Chamber, MD;  Location: [place];  Service: Gynecology;  Laterality: N/A;   LIPOSUCTION WITH LIPOFILLING Bilateral 11/13/2014   Procedure: LIPOFILLING TO BILATERAL CHEST ;  Surgeon: Irene Limbo, MD;  Location: [place];  Service: Plastics;  Laterality: Bilateral;   MELANOMA EXCISION N/A 04/16/2014   Procedure:  WIDE LOCAL EXCISION OF BACK MELANOMA;  Surgeon: Rolm Bookbinder, MD;  Location: [place];  Service: General;  Laterality: N/A;   PORT-A-CATH REMOVAL Right 06/14/2017   Procedure: REMOVAL PORT-A-CATH;  Surgeon: Rolm Bookbinder, MD;  Location: [place];  Service: General;  Laterality: Right;   PORTACATH PLACEMENT Right 10/21/2015   Procedure: INSERTION PORT-A-CATH WITH ULTRASOUND ;  Surgeon: Rolm Bookbinder, MD;  Location: [place];  Service: General;  Laterality: Right;   REMOVAL OF BILATERAL TISSUE EXPANDERS WITH PLACEMENT OF BILATERAL BREAST IMPLANTS  Bilateral 11/13/2014   Procedure: REMOVAL OF BILATERAL TISSUE EXPANDERS WITH PLACEMENT OF BILATERAL SILICONE IMPLANTS ;  Surgeon: Irene Limbo, MD;  Location: [place];  Service: Plastics;  Laterality: Bilateral;   SIMPLE MASTECTOMY WITH AXILLARY SENTINEL NODE BIOPSY Bilateral 04/16/2014   Procedure: BILATERAL SKIN SPARING  MASTECTOMIES WITH LEFT AXILLARY SENTINEL NODE BIOPSY;  Surgeon: Rolm Bookbinder, MD;  Location: [place];  Service: General;  Laterality: Bilateral;    SOCIAL HISTORY: Social History   Socioeconomic History   Marital status: Widowed    Spouse name: Not on file   Number of children: 3   Years of education: Not on file   Highest education level: Not on file  Occupational History   Occupation: Facilities manager: [employer]  Tobacco Use   Smoking status: Some Days    Packs/day: 0.00    Years: 0.00    Total pack years: 0.00    Types: Cigarettes    Last attempt to quit: 04/17/2014    Years since quitting: 8.4   Smokeless tobacco: Never   Tobacco comments:    smokes 1-2 cigs every week  Substance and Sexual Activity   Alcohol use: Yes    Comment: occasionally   Drug use: No   Sexual activity: Not on file  Other Topics Concern   Not on file  Social History Narrative   Not on file   Social Determinants of Health   Financial Resource Strain: Not on file  Food Insecurity: Not on file  Transportation Needs: Not on file  Physical Activity: Not on file  Stress: Not on file  Social Connections: Not on file  Intimate Partner Violence: Not on file    FAMILY HISTORY: Family History  Problem Relation Age of Onset   Other Father        COVID complications   Breast cancer Paternal Aunt        dx > 50   Pancreatic cancer Paternal Uncle 89       smoker; deceased   Heart disease Maternal Grandmother    Heart attack Maternal Grandfather    Breast cancer Paternal Grandmother        dx  10s; ? if had 2nd BC in 82s; deceased 61s   Other Cousin        Lynch syndrome due to PMS2   BRCA 1/2 Cousin     Review of Systems  Constitutional:  Negative for appetite change, chills, fatigue, fever and unexpected weight change.  HENT:   Negative for hearing loss, lump/mass and trouble swallowing.   Eyes:  Negative for eye problems and icterus.  Respiratory:  Positive for cough and shortness of breath. Negative for chest tightness.   Cardiovascular:  Negative for chest pain, leg swelling and palpitations.  Gastrointestinal:  Negative for abdominal distention, abdominal pain, constipation, diarrhea, nausea and vomiting.  Endocrine: Negative for hot flashes.  Genitourinary:  Negative for difficulty urinating.   Musculoskeletal:  Negative for arthralgias.  Skin:  Negative for itching and rash.  Neurological:  Negative for dizziness, extremity weakness, headaches and numbness.  Hematological:  Negative for adenopathy. Does not bruise/bleed easily.  Psychiatric/Behavioral:  Negative for depression. The patient is not nervous/anxious.       PHYSICAL EXAMINATION  ECOG PERFORMANCE STATUS: 1 - Symptomatic but completely ambulatory  Vitals:   09/14/22 0857  BP: 121/80  Pulse: 83  Resp: 18  Temp: (!) 96.8 F (36 C)  SpO2: 97%    Physical Exam Constitutional:      General: She is not in acute distress.    Appearance: Normal appearance. She is not toxic-appearing.  HENT:     Head: Normocephalic and atraumatic.  Eyes:     General: No scleral icterus. Cardiovascular:     Rate and Rhythm: Normal rate and regular rhythm.     Pulses: Normal pulses.     Heart sounds: Normal heart sounds.  Pulmonary:     Effort: Pulmonary effort is normal.     Breath sounds: Normal breath sounds.  Abdominal:     General: Abdomen is flat. Bowel sounds are normal. There is no distension.     Palpations: Abdomen is soft.     Tenderness: There is no abdominal tenderness.  Musculoskeletal:         General: No swelling.     Cervical back: Neck supple.  Lymphadenopathy:     Cervical: No cervical adenopathy.  Skin:    General: Skin is warm and dry.     Findings: No rash.  Neurological:     General: No focal deficit present.     Mental Status: She is alert.  Psychiatric:        Mood and Affect: Mood normal.        Behavior: Behavior normal.     LABORATORY DATA:  CBC    Component Value Date/Time   WBC 2.7 (L) 09/14/2022 0840   WBC 5.2 02/16/2017 1110   WBC 4.6 01/03/2016 0849   RBC 3.68 (L) 09/14/2022 0840   HGB 12.4 09/14/2022 0840   HGB 14.1 02/16/2017 1110   HCT 36.3 09/14/2022 0840   HCT 41.9 02/16/2017 1110   PLT 198 09/14/2022 0840   PLT 232 02/16/2017 1110   MCV 98.6 09/14/2022 0840   MCV 90.6 02/16/2017 1110   MCH 33.7 09/14/2022 0840   MCHC 34.2 09/14/2022 0840   RDW 12.4 09/14/2022 0840   RDW 13.6 02/16/2017 1110   LYMPHSABS 0.7 09/14/2022 0840   LYMPHSABS 1.2 02/16/2017 1110   MONOABS 0.2 09/14/2022 0840   MONOABS 0.3 02/16/2017 1110   EOSABS 0.1 09/14/2022 0840   EOSABS 0.3 02/16/2017 1110   BASOSABS 0.1 09/14/2022 0840   BASOSABS 0.0 02/16/2017 1110    CMP     Component Value Date/Time   NA 141 09/14/2022 0840   NA 142 02/16/2017 1110   K 4.2 09/14/2022 0840   K 4.3 02/16/2017 1110   CL 103 09/14/2022 0840   CO2 32 09/14/2022 0840   CO2 27 02/16/2017 1110   GLUCOSE 97 09/14/2022 0840   GLUCOSE 190 (H) 02/16/2017 1110   BUN 15 09/14/2022 0840   BUN 7.9 02/16/2017 1110   CREATININE 0.92 09/14/2022 0840   CREATININE 0.8 02/16/2017 1110   CALCIUM 9.4 09/14/2022 0840   CALCIUM 9.4 02/16/2017 1110   PROT 6.6 09/14/2022 0840   PROT 7.4 02/16/2017 1110   ALBUMIN 4.0 09/14/2022 0840  ALBUMIN 3.7 02/16/2017 1110   AST 26 09/14/2022 0840   AST 27 02/16/2017 1110   ALT 32 09/14/2022 0840   ALT 50 02/16/2017 1110   ALKPHOS 67 09/14/2022 0840   ALKPHOS 88 02/16/2017 1110   BILITOT 0.6 09/14/2022 0840   BILITOT 0.41 02/16/2017 1110    GFRNONAA >60 09/14/2022 0840   GFRAA >60 03/15/2020 0926       ASSESSMENT and THERAPY PLAN:   Cancer of central portion of left female breast ([place]) Krystal [name] is a 57 year old woman with metastatic breast cancer here today for f/u and discussion of her PET scan results.    I reviewed her PET scan results with her which demonstrate progression in the nodes, lung, and liver.  We need to obtain a liver biopsy for repeat pathology with prognostic details, and molecular testing to help guide future treatments.  I gave her a list of potential treatments, but these are all dependent on her testing results.  For now, she will not take anymore Fulvestrant and Verzenio.    We discussed her cardiac concern regarding the cardiomegaly and LAD artery calcification.  I placed a referral for her to see Dr. Haroldine Laws and sent him and his nurse a message about this.    We will await Krystal [name]'s ultrasound guided biopsy.  She will return in 2 weeks for f/u with Dr. Lindi Adie to discuss her next steps.    All questions were answered. The patient knows to call the clinic with any problems, questions or concerns. We can certainly see the patient much sooner if necessary.  Total encounter time:30 minutes*in face-to-face visit time, chart review, lab review, care coordination, order entry, and documentation of the encounter time.  Wilber Bihari, NP 09/14/22 10:11 AM Medical Oncology and Hematology [facility] [place], [place] [address] Tel. [phone]    Fax. [phone]  *Total Encounter Time as defined by the Centers for Medicare and Medicaid Services includes, in addition to the face-to-face time of a patient visit (documented in the note above) non-face-to-face time: obtaining and reviewing outside history, ordering and reviewing medications, tests or procedures, care coordination (communications with other health care professionals or caregivers) and documentation in the medical  record.

## 2022-09-15 LAB — CANCER ANTIGEN 27.29: CA 27.29: 23.7 U/mL (ref 0.0–38.6)

## 2022-09-15 NOTE — Progress Notes (Signed)
Krystal Daft, MD  Donita Brooks D US guided core biopsy of a hypermetabolic cervical lymph node.  [place]

## 2022-09-19 ENCOUNTER — Telehealth: Payer: Self-pay

## 2022-09-19 NOTE — Telephone Encounter (Signed)
Called Pt regarding her question surrounding upcoming [place] core bx. Pt asking why bx is now of lymph node instead of liver. Per Dr. Markus Daft via secure chat, "I reviewed the recent imaging and discussed with Dr. Lindi Adie. We decided lymph node biopsy was the better and safer option." Message relayed to Pt who verbalized understanding.

## 2022-09-21 ENCOUNTER — Ambulatory Visit (HOSPITAL_COMMUNITY)
Admission: RE | Admit: 2022-09-21 | Discharge: 2022-09-21 | Disposition: A | Discharge status: Home / Self Care | Payer: Commercial Managed Care - PPO | Source: Ambulatory Visit | Attending: Internal Medicine | Admitting: Internal Medicine

## 2022-09-21 ENCOUNTER — Other Ambulatory Visit (HOSPITAL_COMMUNITY): Payer: Self-pay | Admitting: Student

## 2022-09-21 ENCOUNTER — Encounter (HOSPITAL_COMMUNITY): Payer: Self-pay | Admitting: Internal Medicine

## 2022-09-21 VITALS — BP 110/70 | HR 92 | Wt 229.0 lb

## 2022-09-21 DIAGNOSIS — E119 Type 2 diabetes mellitus without complications: Secondary | ICD-10-CM | POA: Diagnosis not present

## 2022-09-21 DIAGNOSIS — I517 Cardiomegaly: Secondary | ICD-10-CM | POA: Diagnosis not present

## 2022-09-21 DIAGNOSIS — Z85820 Personal history of malignant melanoma of skin: Secondary | ICD-10-CM | POA: Insufficient documentation

## 2022-09-21 DIAGNOSIS — I2510 Atherosclerotic heart disease of native coronary artery without angina pectoris: Secondary | ICD-10-CM | POA: Insufficient documentation

## 2022-09-21 DIAGNOSIS — I700 Atherosclerosis of aorta: Secondary | ICD-10-CM | POA: Insufficient documentation

## 2022-09-21 DIAGNOSIS — C50912 Malignant neoplasm of unspecified site of left female breast: Secondary | ICD-10-CM | POA: Insufficient documentation

## 2022-09-21 DIAGNOSIS — Z1501 Genetic susceptibility to malignant neoplasm of breast: Secondary | ICD-10-CM | POA: Diagnosis not present

## 2022-09-21 DIAGNOSIS — Z170 Estrogen receptor positive status [ER+]: Secondary | ICD-10-CM | POA: Diagnosis not present

## 2022-09-21 DIAGNOSIS — C7951 Secondary malignant neoplasm of bone: Secondary | ICD-10-CM | POA: Insufficient documentation

## 2022-09-21 DIAGNOSIS — F17210 Nicotine dependence, cigarettes, uncomplicated: Secondary | ICD-10-CM | POA: Diagnosis not present

## 2022-09-21 DIAGNOSIS — C770 Secondary and unspecified malignant neoplasm of lymph nodes of head, face and neck: Secondary | ICD-10-CM | POA: Diagnosis not present

## 2022-09-21 DIAGNOSIS — C50112 Malignant neoplasm of central portion of left female breast: Secondary | ICD-10-CM | POA: Diagnosis not present

## 2022-09-21 DIAGNOSIS — Z9013 Acquired absence of bilateral breasts and nipples: Secondary | ICD-10-CM | POA: Insufficient documentation

## 2022-09-21 MED ORDER — ASPIRIN 81 MG PO TBEC: 81.0000 mg | DELAYED_RELEASE_TABLET | Freq: Every day | ORAL | 3 refills | Status: AC

## 2022-09-21 MED ORDER — ROSUVASTATIN CALCIUM 10 MG PO TABS: 10.0000 mg | ORAL_TABLET | Freq: Every day | ORAL | 3 refills | Status: AC

## 2022-09-21 NOTE — Patient Instructions (Signed)
START Crestor 10 mg daily.  START Asprin 81 mg daily after your biopsy  Your physician has requested that you have an echocardiogram. Echocardiography is a painless test that uses sound waves to create images of your heart. It provides your doctor with information about the size and shape of your heart and how well your heart's chambers and valves are working. This procedure takes approximately one hour. There are no restrictions for this procedure. Please do NOT wear cologne, perfume, aftershave, or lotions (deodorant is allowed). Please arrive 15 minutes prior to your appointment time.  Your physician recommends that you schedule a follow-up appointment in: 4 months ( June 2024)  ** please call the office in April to arrange your follow up appointment. **  If you have any questions or concerns before your next appointment please send [place] a message through [facility] or call our office at [phone].    TO LEAVE A MESSAGE FOR THE NURSE SELECT OPTION 2, PLEASE LEAVE A MESSAGE INCLUDING: YOUR NAME DATE OF BIRTH CALL BACK NUMBER REASON FOR CALL**this is important as we prioritize the call backs  YOU WILL RECEIVE A CALL BACK THE SAME DAY AS LONG AS YOU CALL BEFORE 4:00 PM  At the [facility], you and your health needs are our priority. As part of our continuing mission to provide you with exceptional heart care, we have created designated Provider Care Teams. These Care Teams include your primary Cardiologist (physician) and Advanced Practice Providers (APPs- Physician Assistants and Nurse Practitioners) who all work together to provide you with the care you need, when you need it.   You may see any of the following providers on your designated Care Team at your next follow up: Dr Glori Bickers Dr Loralie Champagne Dr. Roxana Hires, NP Lyda Jester, [place] [facility] [place], [place] Forestine Na, NP Audry Riles, PharmD   Please be sure to bring in  all your medications bottles to every appointment.    Thank you for choosing [facility]

## 2022-09-21 NOTE — Progress Notes (Signed)
CARDIO-ONCOLOGY CLINIC CONSULT NOTE  Referring Physician: Dr. Lindi Adie Primary Care: Patient, [name]   HPI:  Krystal [name] is a 57 y.o. female with smoker, DM2, recurrent/metastic left breast cancer referred by Dr. Lindi Adie for enrollment into the Cardio-Oncology program.  Diagnosed with left breast CA in 2015. BRCA positive. Underwent bilateral mastectomies. Also had resection of left upper back melanoma.   10/15 -11/16 received tamoxifen therapy   2/17 Recurrence of breast CA. Resumed anastrazole -> Letrozole  -> Exemestane. Underwent resection of skin lesion   4/17 Herceptin q3weeks + anastrazole. + XRT  12/20 Recurrent mets (neck, ribs, L5) -> XRT  3/23 Started fulvestrant and Verzenio   1/24 Progressive disease (lungs, liver, nodes)  2/24 PET with progressive disease + aortic atherosclerosis  Echo 7/18 EF 55-60%   Referred to discuss LAD calcification and cardiomegaly on CT.   Denies h/o known heart disease. Works as Optometrist. No previous stress test/cath. "Sporadic" chest pain with movement around implants. Smokes ~ 3 cigs/day. Minimal DOE with stairs. No edema, orthopnea or PND. Currently off all chemo. (Stopped Verzenio about 1 week ago)   Fhx:  M alive 13s no CAD F died COVID no CAD 2 brothers alive  - oldest brother with CAD s/p CABG - younger brother no CAD   Review of Systems: [y] = yes, [ ]$  = no   General: Weight gain [ ]$ ; Weight loss [ ]$ ; Anorexia [ ]$ ; Fatigue [ y]; Fever [ ]$ ; Chills [ ]$ ; Weakness [ y]  Cardiac: Chest pain/pressure [ ]$ ; Resting SOB [ ]$ ; Exertional SOB Blue.Reese ]; Orthopnea [ ]$ ; Pedal Edema [ ]$ ; Palpitations [ ]$ ; Syncope [ ]$ ; Presyncope [ ]$ ; Paroxysmal nocturnal dyspnea[ ]$   Pulmonary: Cough [ ]$ ; Wheezing[ ]$ ; Hemoptysis[ ]$ ; Sputum [ ]$ ; Snoring [ ]$   GI: Vomiting[ ]$ ; Dysphagia[ ]$ ; Melena[ ]$ ; Hematochezia [ ]$ ; Heartburn[ ]$ ; Abdominal pain [ ]$ ; Constipation [ ]$ ; Diarrhea [ ]$ ; BRBPR [ ]$   GU: Hematuria[ ]$ ; Dysuria [ ]$ ; Nocturia[ ]$   Vascular: Pain in  legs with walking [ ]$ ; Pain in feet with lying flat [ ]$ ; Non-healing sores [ ]$ ; Stroke [ ]$ ; TIA [ ]$ ; Slurred speech [ ]$ ;  Neuro: Headaches[y ]; Vertigo[ ]$ ; Seizures[ ]$ ; Paresthesias[ ]$ ;Blurred vision [ ]$ ; Diplopia [ ]$ ; Vision changes [ ]$   Ortho/Skin: Arthritis Blue.Reese ]; Joint pain Blue.Reese ]; Muscle pain Blue.Reese ]; Joint swelling [ ]$ ; Back Pain [ ]$ ; Rash [ ]$   Psych: Depression[y ]; Anxiety[y ]  Heme: Bleeding problems [ ]$ ; Clotting disorders [ ]$ ; Anemia [ ]$   Endocrine: Diabetes [ y]; Thyroid dysfunction[ ]$    Past Medical History:  Diagnosis Date   Arthritis    shoulders   BRCA1 positive    BRCA1 [id number]   Cancer ([place])    Chest wall mass [id number]   Complication of anesthesia    respiratory depression/arrest with Dilaudid   Cough with sputum 09/10/2015   clear sputum, per pt.   Dental crowns present    Diabetes mellitus without complication ([place])    Family history of Lynch syndrome    Family history of pancreatic cancer    GERD (gastroesophageal reflux disease)    takes OTC meds   History of breast cancer 2015   Stuffy and runny nose 09/10/2015   green drainage from nose, per pt.    Current Outpatient Medications  Medication Sig Dispense Refill   aspirin-acetaminophen-caffeine (EXCEDRIN MIGRAINE) 250-250-65 MG tablet Take 2 tablets by mouth every 6 (six) hours as needed  for headache.     calcium-vitamin D (OSCAL WITH D) 500-200 MG-UNIT tablet Take 1 tablet by mouth 2 (two) times daily. 180 tablet 3   esomeprazole (NEXIUM) 40 MG capsule Take 1 capsule by mouth daily.     loperamide (IMODIUM) 2 MG capsule Take 2 mg by mouth as needed for diarrhea or loose stools. Take 2 tablets at first diarrhea and then 1 tablet after additional loose stool. Max number in 24 hours is 8 tablets.     loratadine (CLARITIN) 10 MG tablet Take by mouth.     meloxicam (MOBIC) 15 MG tablet TAKE 1 TABLET(15 MG) BY MOUTH DAILY AS NEEDED FOR PAIN 30 tablet 1   ondansetron (ZOFRAN) 8 MG tablet Take 1 tablet (8 mg total)  by mouth every 8 (eight) hours as needed for nausea or vomiting. 30 tablet 10   prochlorperazine (COMPAZINE) 10 MG tablet Take 1 tablet (10 mg total) by mouth every 6 (six) hours as needed for nausea or vomiting. 30 tablet 2   traZODone (DESYREL) 50 MG tablet TAKE 1 TABLET(50 MG) BY MOUTH AT BEDTIME 30 tablet 11   venlafaxine XR (EFFEXOR-XR) 75 MG 24 hr capsule TAKE 1 CAPSULE(75 MG) BY MOUTH AT BEDTIME 90 capsule 3   No current facility-administered medications for this encounter.    Allergies  Allergen Reactions   Dilaudid [Hydromorphone] Other (See Comments)    RESPIRATORY DEPRESSION   Codeine Nausea And Vomiting      Social History   Socioeconomic History   Marital status: Widowed    Spouse name: Not on file   Number of children: 3   Years of education: Not on file   Highest education level: Not on file  Occupational History   Occupation: Facilities manager: [employer]  Tobacco Use   Smoking status: Some Days    Packs/day: 0.00    Years: 0.00    Total pack years: 0.00    Types: Cigarettes    Last attempt to quit: 04/17/2014    Years since quitting: 8.4   Smokeless tobacco: Never   Tobacco comments:    smokes 1-2 cigs every week  Substance and Sexual Activity   Alcohol use: Yes    Comment: occasionally   Drug use: No   Sexual activity: Not on file  Other Topics Concern   Not on file  Social History Narrative   Not on file   Social Determinants of Health   Financial Resource Strain: Not on file  Food Insecurity: Not on file  Transportation Needs: Not on file  Physical Activity: Not on file  Stress: Not on file  Social Connections: Not on file  Intimate Partner Violence: Not on file      Family History  Problem Relation Age of Onset   Other Father        COVID complications   Breast cancer Paternal Aunt        dx > 50   Pancreatic cancer Paternal Uncle 17       smoker; deceased   Heart disease Maternal Grandmother     Heart attack Maternal Grandfather    Breast cancer Paternal Grandmother        dx 72s; ? if had 2nd BC in 57s; deceased 81s   Other Cousin        Lynch syndrome due to PMS2   BRCA 1/2 Cousin     Vitals:   09/21/22 1045  BP: 110/70  Pulse: 92  SpO2:  95%  Weight: 103.9 kg (229 lb)    PHYSICAL EXAM: General:  Well appearing. No respiratory difficulty HEENT: normal Neck: supple. no JVD. Carotids 2+ bilat; no bruits. No lymphadenopathy or thryomegaly appreciated. Cor: PMI nondisplaced. Regular rate & rhythm. No rubs, gallops or murmurs. Lungs: clear Abdomen: obese soft, nontender, nondistended. No hepatosplenomegaly. No bruits or masses. Good bowel sounds. Extremities: no cyanosis, clubbing, rash, edema Neuro: alert & oriented x 3, cranial nerves grossly intact. moves all 4 extremities w/o difficulty. Affect pleasant.  ECG: Sinus tach 101 No ST-T wave abnormalities. Personally reviewed   ASSESSMENT & PLAN:  1. Coronary calcifications and cardiomegaly seen on chest CT - I have reviewed images personally with Krystal [name]. She has multiple CRFs and significant/heavy coronary calcifications in mid to distal LAD.  - Fortunately she is not having any signs/symptoms of angina and her left main seems free of significant calcification on CT - We discussed further evaluation with stress testing, cardiac CT or cardiac cath - Given lack of symptoms and concomitant metastatic CA, I think best option at this point is to proceed with medical management to prevent disease progression - Start Crestor 10. Can start ECASA 81 after biopsy - Monitor closely for symptoms - Discussed RF modification with diabetes control, smoking cessation, exercise and diet - Will get echo to evaluate cardiomegaly, particularly in light of cardiotoxicity from chemo agents  2. Metastatic Breast CA - per Oncology  3. Tobacco abuse - discussed cessation  Krystal Bickers, MD  12:30 PM

## 2022-09-22 ENCOUNTER — Other Ambulatory Visit: Payer: Self-pay

## 2022-09-22 ENCOUNTER — Ambulatory Visit (HOSPITAL_COMMUNITY)
Admission: RE | Admit: 2022-09-22 | Discharge: 2022-09-22 | Disposition: A | Discharge status: Home / Self Care | Payer: Commercial Managed Care - PPO | Source: Ambulatory Visit | Attending: Adult Health | Admitting: Adult Health

## 2022-09-22 ENCOUNTER — Encounter (HOSPITAL_COMMUNITY): Payer: Self-pay

## 2022-09-22 ENCOUNTER — Other Ambulatory Visit: Payer: Self-pay | Admitting: Hematology and Oncology

## 2022-09-22 ENCOUNTER — Telehealth: Payer: Self-pay | Admitting: Hematology and Oncology

## 2022-09-22 ENCOUNTER — Telehealth: Payer: Self-pay

## 2022-09-22 DIAGNOSIS — C50112 Malignant neoplasm of central portion of left female breast: Secondary | ICD-10-CM | POA: Diagnosis not present

## 2022-09-22 DIAGNOSIS — C7951 Secondary malignant neoplasm of bone: Secondary | ICD-10-CM

## 2022-09-22 LAB — GLUCOSE, CAPILLARY: Glucose-Capillary: 105 mg/dL — ABNORMAL HIGH (ref 70–99)

## 2022-09-22 MED ORDER — LIDOCAINE HCL 1 % IJ SOLN
INTRAMUSCULAR | Status: AC
  Filled 2022-09-22: qty 20

## 2022-09-22 MED ORDER — FENTANYL CITRATE (PF) 100 MCG/2ML IJ SOLN
INTRAMUSCULAR | Status: AC | PRN
  Administered 2022-09-22 (×2): 50 ug via INTRAVENOUS

## 2022-09-22 MED ORDER — SODIUM CHLORIDE 0.9 % IV SOLN: INTRAVENOUS | Status: DC

## 2022-09-22 MED ORDER — MIDAZOLAM HCL 2 MG/2ML IJ SOLN
INTRAMUSCULAR | Status: AC | PRN
  Administered 2022-09-22: 1 mg via INTRAVENOUS
  Administered 2022-09-22: 2 mg via INTRAVENOUS
  Administered 2022-09-22: 1 mg via INTRAVENOUS

## 2022-09-22 MED ORDER — MIDAZOLAM HCL 2 MG/2ML IJ SOLN
INTRAMUSCULAR | Status: AC
  Filled 2022-09-22: qty 2

## 2022-09-22 MED ORDER — FENTANYL CITRATE (PF) 100 MCG/2ML IJ SOLN
INTRAMUSCULAR | Status: AC
  Filled 2022-09-22: qty 2

## 2022-09-22 NOTE — Telephone Encounter (Signed)
Called and LVM regarding verzenio refill request. Per NP last note, Pt was to stop verzenio until meeting with Dr. Lindi Adie on 2/23. Asked for call back.

## 2022-09-22 NOTE — Telephone Encounter (Signed)
Rescheduled appointment per provider PAL. Left voicemail.

## 2022-09-22 NOTE — Consult Note (Signed)
Chief Complaint: Patient was seen in consultation today for image guided cervical lymph node biopsy  Referring Physician(s): [place]  Supervising Physician: Ruthann Cancer  Patient Status: [facility] - Out-pt  History of Present Illness: Krystal [name] is a 57 y.o. female smoker with past medical history significant for arthritis, diabetes, family history of Lynch syndrome, GERD, melanoma 2015 as well as left breast cancer in 2015, status post bilateral mastectomies/chemoradiation.  Recent PET scan on 09/08/22 revealed:   1. Worsening of disease now with nodal, pulmonary and hepatic metastases. 2. Areas of variable FDG uptake with some new bone lesions and some areas of bony metastasis which are more focal, for instance in the pelvis where there was likely diffuse involvement on prior imaging. Many of the areas of sclerosis do not show substantial uptake as outlined above and some areas show improvement/resolution of FDG uptake seen previously but there are signs of active bony metastatic disease currently. 3. Likely LEFT adrenal adenoma. 4. Severe hepatic steatosis. 5. Aortic atherosclerosis.  She presents today for image guided cervical lymph node biopsy for further evaluation/prognostic panel/molecular testing.   Past Surgical History:  Procedure Laterality Date   ABDOMINAL HYSTERECTOMY     BREAST LUMPECTOMY Left 09/16/2015   Procedure: LEFT BREAST MASS EXCISION;  Surgeon: Rolm Bookbinder, MD;  Location: [place];  Service: General;  Laterality: Left;   BREAST RECONSTRUCTION WITH PLACEMENT OF TISSUE EXPANDER AND FLEX HD (ACELLULAR HYDRATED DERMIS) Bilateral 04/16/2014   Procedure: BILATERAL BREAST RECONSTRUCTION WITH PLACEMENT OF TISSUE EXPANDER AND FLEX HD (ACELLULAR HYDRATED DERMIS);  Surgeon: Irene Limbo, MD;  Location: [place];  Service: Plastics;  Laterality: Bilateral;   CESAREAN SECTION  1999; 2001; 2005   [place] AND CLOSURE WOUND Bilateral 05/15/2014   Procedure: DEBRIDEMENT AND CLOSURE OF BILATERAL MASECTOMY INCISIONS WITH BREAST EXPANSION;  Surgeon: Irene Limbo, MD;  Location: [place];  Service: Plastics;  Laterality: Bilateral;   LAPAROSCOPIC ASSISTED VAGINAL HYSTERECTOMY N/A 08/13/2014   Procedure: OPEN LAPAROSCOPY, EXPLORATORY LAPAROTOMY, TOTAL ABDOMINAL HYSTERECTOMY WITH BILATERAL SALPINGECTOMY AND BILATERAL OOPHORECTOMY;  Surgeon: Darlyn Chamber, MD;  Location: [place];  Service: Gynecology;  Laterality: N/A;   LIPOSUCTION WITH LIPOFILLING Bilateral 11/13/2014   Procedure: LIPOFILLING TO BILATERAL CHEST ;  Surgeon: Irene Limbo, MD;  Location: [place];  Service: Plastics;  Laterality: Bilateral;   MELANOMA EXCISION N/A 04/16/2014   Procedure: WIDE LOCAL EXCISION OF BACK MELANOMA;  Surgeon: Rolm Bookbinder, MD;  Location: [place];  Service: General;  Laterality: N/A;   PORT-A-CATH REMOVAL Right 06/14/2017   Procedure: REMOVAL PORT-A-CATH;  Surgeon: Rolm Bookbinder, MD;  Location: [place];  Service: General;  Laterality: Right;   PORTACATH PLACEMENT Right 10/21/2015   Procedure: INSERTION PORT-A-CATH WITH ULTRASOUND ;  Surgeon: Rolm Bookbinder, MD;  Location: [place];  Service: General;  Laterality: Right;   REMOVAL OF BILATERAL TISSUE EXPANDERS WITH PLACEMENT OF BILATERAL BREAST IMPLANTS Bilateral 11/13/2014   Procedure: REMOVAL OF BILATERAL TISSUE EXPANDERS WITH PLACEMENT OF BILATERAL SILICONE IMPLANTS ;  Surgeon: Irene Limbo, MD;  Location: [place];  Service: Plastics;  Laterality: Bilateral;   SIMPLE MASTECTOMY WITH AXILLARY SENTINEL NODE BIOPSY Bilateral 04/16/2014   Procedure: BILATERAL SKIN SPARING  MASTECTOMIES WITH LEFT AXILLARY SENTINEL NODE BIOPSY;  Surgeon: Rolm Bookbinder, MD;  Location: [place];  Service:  General;  Laterality: Bilateral;  Allergies: Dilaudid [hydromorphone] and Codeine  Medications: Prior to Admission medications   Medication Sig Start Date End Date Taking? Authorizing Provider  calcium-vitamin D (OSCAL WITH D) 500-200 MG-UNIT tablet Take 1 tablet by mouth 2 (two) times daily. 08/25/19  Yes [name], MD  esomeprazole (NEXIUM) 40 MG capsule Take 1 capsule by mouth daily. 09/04/18  Yes [provider]  loperamide (IMODIUM) 2 MG capsule Take 2 mg by mouth as needed for diarrhea or loose stools. Take 2 tablets at first diarrhea and then 1 tablet after additional loose stool. Max number in 24 hours is 8 tablets.   Yes [provider]  loratadine (CLARITIN) 10 MG tablet Take by mouth.   Yes [provider]  meloxicam (MOBIC) 15 MG tablet TAKE 1 TABLET(15 MG) BY MOUTH DAILY AS NEEDED FOR PAIN 08/24/22  Yes [name], MD  traZODone (DESYREL) 50 MG tablet TAKE 1 TABLET(50 MG) BY MOUTH AT BEDTIME 11/30/21  Yes [name], MD  venlafaxine XR (EFFEXOR-XR) 75 MG 24 hr capsule TAKE 1 CAPSULE(75 MG) BY MOUTH AT BEDTIME 07/24/22  Yes [name], MD  aspirin EC 81 MG tablet Take 1 tablet (81 mg total) by mouth daily. Swallow whole. 09/21/22   Bensimhon, Shaune Pascal, MD  aspirin-acetaminophen-caffeine (EXCEDRIN MIGRAINE) [phone] MG tablet Take 2 tablets by mouth every 6 (six) hours as needed for headache.    [provider]  ondansetron (ZOFRAN) 8 MG tablet Take 1 tablet (8 mg total) by mouth every 8 (eight) hours as needed for nausea or vomiting. 12/15/21   [name], MD  prochlorperazine (COMPAZINE) 10 MG tablet Take 1 tablet (10 mg total) by mouth every 6 (six) hours as needed for nausea or vomiting. 10/14/21   [name], MD  rosuvastatin (CRESTOR) 10 MG tablet Take 1 tablet (10 mg total) by mouth daily. 09/21/22   Bensimhon, Shaune Pascal, MD     Family History  Problem Relation Age of Onset   Other Father        COVID complications    Breast cancer Paternal Aunt        dx > 29   Pancreatic cancer Paternal Uncle 27       smoker; deceased   Heart disease Maternal Grandmother    Heart attack Maternal Grandfather    Breast cancer Paternal Grandmother        dx 13s; ? if had 2nd BC in 67s; deceased 22s   Other Cousin        Lynch syndrome due to PMS2   BRCA 1/2 Cousin     Social History   Socioeconomic History   Marital status: Widowed    Spouse name: Not on file   Number of children: 3   Years of education: Not on file   Highest education level: Not on file  Occupational History   Occupation: Facilities manager: [employer]  Tobacco Use   Smoking status: Some Days    Packs/day: 0.00    Years: 0.00    Total pack years: 0.00    Types: Cigarettes    Last attempt to quit: 04/17/2014    Years since quitting: 8.4   Smokeless tobacco: Never   Tobacco comments:    smokes 1-2 cigs every week  Substance and Sexual Activity   Alcohol use: Yes    Comment: occasionally   Drug use: No   Sexual activity: Not on file  Other Topics Concern   Not on file  Social  History Narrative   Not on file   Social Determinants of Health   Financial Resource Strain: Not on file  Food Insecurity: Not on file  Transportation Needs: Not on file  Physical Activity: Not on file  Stress: Not on file  Social Connections: Not on file      Review of Systems denies fever, chest pain, worsening dyspnea, abdominal/back pain, nausea, vomiting or bleeding.  She does have a mild headache and occasional cough.  Vital Signs: BP (!) 144/84   Pulse 81   Temp 99 F (37.2 C) (Oral)   Resp 16   Ht 5' 4"$  (1.626 m)   Wt 229 lb (103.9 kg)   LMP 07/06/2014   SpO2 97%   BMI 39.31 kg/m   Code Status: FULL CODE  Physical Exam awake, alert.  Chest clear to auscultation bilaterally.  Heart with regular rate and rhythm.  Abdomen soft, positive bowel sounds, nontender.  No lower extremity  edema.  Imaging: NM PET Image Restag (PS) Skull Base To Thigh  Result Date: 09/08/2022 CLINICAL DATA:  Subsequent treatment strategy for metastatic breast cancer. EXAM: NUCLEAR MEDICINE PET SKULL BASE TO THIGH TECHNIQUE: 11.33 mCi F-18 FDG was injected intravenously. Full-ring PET imaging was performed from the skull base to thigh after the radiotracer. CT data was obtained and used for attenuation correction and anatomic localization. Fasting blood glucose: 106 mg/dl COMPARISON:  Multiple prior studies most recent comparison chest, abdomen and pelvis CT of August 23, 2022 FINDINGS: Mediastinal blood pool activity: SUV max 3.13 Liver activity: SUV max NA NECK: No hypermetabolic lymph nodes in the neck. See below for thoracic inlet findings. Incidental CT findings: None. CHEST: Small bilateral thoracic inlet lymph nodes indicated on prior CT imaging largest 9 mm at the LEFT thoracic inlet showing a maximum SUV of 7.07 (image 44/4) smaller in similarly hypermetabolic lymph nodes are seen just lateral to this and also in the contralateral thoracic inlet. RIGHT paratracheal adenopathy, hypermetabolic (image [id number]) maximum SUV 12.39. Marked increased metabolic activity with similar SUV at the RIGHT hilum on image 68/4. Small pre-vascular and LEFT hilar as well as subcarinal lymph nodes all with increased metabolic activity. (Image 76/4) 2.6 x 1.8 cm hypermetabolic lingular nodule maximum SUV 14.7. RIGHT upper lobe nodule on image 64/4 also with increased metabolic activity measuring 11 mm greatest axial dimension, amidst bronchovascular structures showing a maximum SUV of 8.15 no change in other small areas of nodularity in the chest compared to recent CT imaging. These are small for PET assessment without metabolic activity beyond mediastinal blood pool but remains suspicious none the less. Incidental CT findings: Post bilateral breast reconstruction and LEFT axillary dissection. No consolidative changes. No sign of  pleural effusion. Airways are patent. No pneumothorax. ABDOMEN/PELVIS: Markedly increased metabolic activity near the dome of the RIGHT hemiliver corresponding to an area seen on the recent CT evaluation. (Image 82/4) up to 2.7 cm perhaps even larger based on metabolic activity with a maximum SUV of 17.14. This is in hepatic subsegment VII. Hepatic subsegment IV B with increased metabolic activity as well, maximum SUV 15.08. Lesion poorly seen on noncontrast CT in this the a ptotic liver measuring approximately 15 mm on the prior study. Lesion in hepatic subsegment VI not seen on CT images. Area of increased metabolic activity measuring approximately 3.7 cm with a maximum SUV of 23.01. Involvement appears confined at this point to the RIGHT lobe of the liver. Portacaval adenopathy with a maximum SUV of 10.3 on image 110/4  measuring 8-9 mm short axis. Peritoneal nodule (image 110/4) maximum SUV 2.7 6 mm., Incidental CT findings: Severe hepatic steatosis. Post cholecystectomy. LEFT adrenal nodule without increased metabolic activity is likely an adrenal adenoma and is not changed. Aortic atherosclerosis without aneurysmal dilation of the abdominal aorta. No adenopathy by size criteria in the abdomen or the pelvis. Post hysterectomy. SKELETON: Signs of bony metastatic disease with new lesion in the RIGHT humeral head with mild-to-moderate FDG uptake. Area in the RIGHT acromion remains hypermetabolic as on the more remote PET examination with a maximum SUV of 7.82 previously 5.23. C7 posterior element involvement with a maximum SUV of 5.58, subtle lucency mixed lytic and sclerotic change in this area with a maximum SUV of 5.58. T1 transverse process with mild increased metabolic activity. Subtle areas of heterogeneity throughout thoracic spine elsewhere are mildly suspicious. Scattered lesions RIGHT-sided ribs, for instance in the RIGHT lateral sixth rib sclerosis with moderate FDG uptake. Scattered areas of elsewhere  in the RIGHT chest with corresponding mild FDG uptake sclerotic change. Similar appearance of the LEFT fifth rib laterally. L5 uptake seen on previous imaging has resolved. Many of the areas of sclerosis in the spine which have developed since the prior PET show limited FDG uptake, for instance at the T10 level an area of near confluent sclerosis shows a maximum SUV of only 3.72. Also the L5 level reported above shows limited FDG uptake. Sclerotic foci in the LEFT sacrum and LEFT iliac bone however show moderate FDG uptake, for instance in the LEFT iliac bone maximum SUV of 4.03 is noted. Sclerotic focus in the RIGHT acetabulum with similar metabolic activity. More diffuse activity was noted in the pelvis on previous PET imaging. Only subtle activity noted about sclerotic foci in the RIGHT proximal femur on the current study. Incidental CT findings: Multifocal bony sclerosis as outlined above not substantially changed compared to recent CT imaging. IMPRESSION: 1. Worsening of disease now with nodal, pulmonary and hepatic metastases. 2. Areas of variable FDG uptake with some new bone lesions and some areas of bony metastasis which are more focal, for instance in the pelvis where there was likely diffuse involvement on prior imaging. Many of the areas of sclerosis do not show substantial uptake as outlined above and some areas show improvement/resolution of FDG uptake seen previously but there are signs of active bony metastatic disease currently. 3. Likely LEFT adrenal adenoma. 4. Severe hepatic steatosis. 5. Aortic atherosclerosis. Aortic Atherosclerosis (ICD10-I70.0). Electronically Signed   By: Zetta Bills M.D.   On: 09/08/2022 09:37    Labs:  CBC: Recent Labs    03/09/22 1458 04/06/22 1455 05/11/22 1429 09/14/22 0840  WBC 2.9* 2.7* 3.7* 2.7*  HGB 12.2 11.5* 12.3 12.4  HCT 35.1* 33.3* 36.6 36.3  PLT 220 184 290 198    COAGS: No results for input(s): "INR", "APTT" in the last 8760  hours.  BMP: Recent Labs    03/09/22 1458 04/06/22 1455 05/11/22 1429 08/23/22 0839 09/14/22 0840  NA 140 142 139  --  141  K 4.2 4.1 4.2  --  4.2  CL 102 106 102  --  103  CO2 32 31 31  --  32  GLUCOSE 117* 89 120*  --  97  BUN 16 14 13  $ --  15  CALCIUM 9.2 9.4 9.3  --  9.4  CREATININE 0.84 0.78 0.82 0.90 0.92  GFRNONAA >60 >60 >60  --  >60    LIVER FUNCTION TESTS: Recent Labs  03/09/22 1458 04/06/22 1455 05/11/22 1429 09/14/22 0840  BILITOT 0.4 0.5 0.3 0.6  AST 23 26 37 26  ALT 24 30 41 32  ALKPHOS 63 60 63 67  PROT 7.3 6.8 7.5 6.6  ALBUMIN 4.4 4.1 4.3 4.0    TUMOR MARKERS: No results for input(s): "AFPTM", "CEA", "CA199", "CHROMGRNA" in the last 8760 hours.  Assessment and Plan: 57 y.o. female smoker with past medical history significant for arthritis, diabetes, family history of Lynch syndrome, GERD, melanoma 2015 as well as left breast cancer in 2015, status post bilateral mastectomies/chemoradiation.  Recent PET scan on 09/08/22 revealed:   1. Worsening of disease now with nodal, pulmonary and hepatic metastases. 2. Areas of variable FDG uptake with some new bone lesions and some areas of bony metastasis which are more focal, for instance in the pelvis where there was likely diffuse involvement on prior imaging. Many of the areas of sclerosis do not show substantial uptake as outlined above and some areas show improvement/resolution of FDG uptake seen previously but there are signs of active bony metastatic disease currently. 3. Likely LEFT adrenal adenoma. 4. Severe hepatic steatosis. 5. Aortic atherosclerosis.  She presents today for image guided cervical lymph node biopsy for further evaluation/prognostic panel/molecular testing.Risks and benefits of procedure was discussed with the patient/significant other including, but not limited to bleeding, infection, damage to adjacent structures or low yield requiring additional tests.  All of the questions  were answered and there is agreement to proceed.  Consent signed and in chart.    Thank you for this interesting consult.  I greatly enjoyed meeting Antonesha Cohran and look forward to participating in their care.  A copy of this report was sent to the requesting provider on this date.  Electronically Signed: D. Rowe Robert, PA-C 09/22/2022, 12:21 PM   I spent a total of  25 minutes   in face to face in clinical consultation, greater than 50% of which was counseling/coordinating care for image guided cervical lymph node biopsy

## 2022-09-22 NOTE — Procedures (Signed)
Interventional Radiology Procedure Note  Procedure: Ultrasound guided left supraclavicular lymph node biopsy  Findings: Please refer to procedural dictation for full description. 18 ga core x3 from left supraclavicular lymph node.  Complications: None immediate  Estimated Blood Loss: < 5 mL  Recommendations: Follow up Pathology results.   Ruthann Cancer, MD

## 2022-09-25 ENCOUNTER — Telehealth: Payer: Self-pay | Admitting: Hematology and Oncology

## 2022-09-25 NOTE — Telephone Encounter (Signed)
Scheduled appointment per patients request. Patient is aware. 

## 2022-09-27 LAB — SURGICAL PATHOLOGY

## 2022-09-27 NOTE — Progress Notes (Signed)
Patient Care Team: Patient, [name] [name], MD as Medical Oncologist (Hematology and Oncology) [name], MD as Consulting Physician (Radiation Oncology) [name], NP as Nurse Practitioner (Hematology and Oncology) Rolm Bookbinder, MD as Consulting Physician (General Surgery) [name], RPH-CPP as Pharmacist (Hematology and Oncology)  DIAGNOSIS: No diagnosis found.  SUMMARY OF ONCOLOGIC HISTORY: Oncology History  Cancer of central portion of left female breast ([place])  04/02/2014 Genetic Testing   BRCA1 [id number] pathogenic mutation identified on gene sequce and del/dup analyses of BRCA1 and BRCA2.  BRCA1 p.H1283R VUS identified as well.  The report date is 04/02/2014.  UPDATE: BRCA1 [id number] VUS has been reclassified as a likely benign variant.  The reclassification date is 04/21/2020.   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 N0 M0 stage IA: Oncotype DX 13 (ROR 9%)   04/16/2014 Surgery   Left upper back melanoma resected no residual melanoma identified on re-resection margins negative   05/26/2014 - 06/08/2015 Anti-estrogen oral therapy   Tamoxifen 20 mg daily with a plan to switch her to aromatase inhibitors once she gets oophorectomy ( patient did not continue antiestrogen therapy by choice)   08/13/2014 Surgery   oophorectomy with total abdominal hysterectomy   09/08/2015 -  Anti-estrogen oral therapy   Resumed anastrozole after she found recurrence of breast cancer; stopped in January 2018, started letrozole 2.5 mg daily 10/12/2016; switched to Exemestane 25 mg daily on 01/03/17   09/16/2015 Surgery   Skin, left: IDC grade 3; 0.5 cm, margins negative, ER 95%, PR 10%, HER-2 positive ratio 2.08   11/08/2015 - 02/2017 Chemotherapy    Herceptin every 3 weeks plus anastrozole   11/24/2015 - 01/14/2016 Radiation Therapy   Adjuvant radiation therapy by Dr. Sondra Come   01/2017 -  Anti-estrogen oral therapy   Exemestane daily (stopped others due to side effects), due to muscle aches and pains switch to tamoxifen 5 mg daily starting 12/18/2017   08/07/2019 Relapse/Recurrence   Bone scan: Sclerotic metastatic lesions involving left femoral neck, right ninth rib and entire L5 vertebra; MRI left hip: Diffuse signal abnormality L5 vertebral body with possible extraosseous extension   08/22/2019 - 09/03/2019 Radiation Therapy   Palliative radiation with Dr. Sondra Come to L5 and left proixmal femur.    10/2021 Treatment Plan Change   Fulvestrant and Verzenio   08/23/2022 Imaging   CT CAP 08/23/2022: New and enlarged pulmonary nodule consistent with progression.  Enlarged small low cervical and mediastinal nodes.  Multiple bone metastases minimally progressive.  Liver lesions evaluation is challenging because of hepatic steatosis with suspected liver progression, isolated tiny omental nodule     09/07/2022 PET scan   IMPRESSION: 1. Worsening of disease now with nodal, pulmonary and hepatic metastases. 2. Areas of variable FDG uptake with some new bone lesions and some areas of bony metastasis which are more focal, for instance in the pelvis where there was likely diffuse involvement on prior imaging. Many of the areas of sclerosis do not show substantial uptake as outlined above and some areas show improvement/resolution of FDG uptake seen previously but there are signs of active bony metastatic disease currently. 3. Likely LEFT adrenal adenoma. 4. Severe hepatic steatosis. 5. Aortic atherosclerosis.     CHIEF COMPLIANT: metastatic breast cancer with metastases osseous  INTERVAL HISTORY: Krystal [name]  is a 57 y.o. with a history of metastatic breast cancer with metastases osseous . She reports to the clinic today for a  follow-up.   ALLERGIES:  is allergic to dilaudid [hydromorphone] and codeine.  MEDICATIONS:  Current Outpatient Medications  Medication Sig Dispense Refill   aspirin EC 81 MG tablet Take 1 tablet (81 mg total) by mouth daily. Swallow whole. 90 tablet 3   aspirin-acetaminophen-caffeine (EXCEDRIN MIGRAINE) 250-250-65 MG tablet Take 2 tablets by mouth every 6 (six) hours as needed for headache.     calcium-vitamin D (OSCAL WITH D) 500-200 MG-UNIT tablet Take 1 tablet by mouth 2 (two) times daily. 180 tablet 3   esomeprazole (NEXIUM) 40 MG capsule Take 1 capsule by mouth daily.     loperamide (IMODIUM) 2 MG capsule Take 2 mg by mouth as needed for diarrhea or loose stools. Take 2 tablets at first diarrhea and then 1 tablet after additional loose stool. Max number in 24 hours is 8 tablets.     loratadine (CLARITIN) 10 MG tablet Take by mouth.     meloxicam (MOBIC) 15 MG tablet TAKE 1 TABLET(15 MG) BY MOUTH DAILY AS NEEDED FOR PAIN 30 tablet 1   ondansetron (ZOFRAN) 8 MG tablet Take 1 tablet (8 mg total) by mouth every 8 (eight) hours as needed for nausea or vomiting. 30 tablet 10   prochlorperazine (COMPAZINE) 10 MG tablet Take 1 tablet (10 mg total) by mouth every 6 (six) hours as needed for nausea or vomiting. 30 tablet 2   rosuvastatin (CRESTOR) 10 MG tablet Take 1 tablet (10 mg total) by mouth daily. 90 tablet 3   traZODone (DESYREL) 50 MG tablet TAKE 1 TABLET(50 MG) BY MOUTH AT BEDTIME 30 tablet 11   venlafaxine XR (EFFEXOR-XR) 75 MG 24 hr capsule TAKE 1 CAPSULE(75 MG) BY MOUTH AT BEDTIME 90 capsule 3   No current facility-administered medications for this visit.    PHYSICAL EXAMINATION: ECOG PERFORMANCE STATUS: {CHL ONC ECOG [id number]}  There were no vitals filed for this visit. There were no vitals filed for this visit.  BREAST:*** No palpable masses or nodules in either right or left breasts. No palpable axillary supraclavicular or infraclavicular adenopathy no breast tenderness  or nipple discharge. (exam performed in the presence of a chaperone)  LABORATORY DATA:  I have reviewed the data as listed    Latest Ref Rng & Units 09/14/2022    8:40 AM 08/23/2022    8:39 AM 05/11/2022    2:29 PM  CMP  Glucose 70 - 99 mg/dL 97   120   BUN 6 - 20 mg/dL 15   13   Creatinine 0.44 - 1.00 mg/dL 0.92  0.90  0.82   Sodium 135 - 145 mmol/L 141   139   Potassium 3.5 - 5.1 mmol/L 4.2   4.2   Chloride 98 - 111 mmol/L 103   102   CO2 22 - 32 mmol/L 32   31   Calcium 8.9 - 10.3 mg/dL 9.4   9.3   Total Protein 6.5 - 8.1 g/dL 6.6   7.5   Total Bilirubin 0.3 - 1.2 mg/dL 0.6   0.3   Alkaline Phos 38 - 126 U/L 67   63   AST 15 - 41 U/L 26   37   ALT 0 - 44 U/L 32   41     Lab Results  Component Value Date   WBC 2.7 (L) 09/14/2022   HGB 12.4 09/14/2022   HCT  36.3 09/14/2022   MCV 98.6 09/14/2022   PLT 198 09/14/2022   NEUTROABS 1.6 (L) 09/14/2022    ASSESSMENT & PLAN:  No problem-specific Assessment & Plan notes found for this encounter.    No orders of the defined types were placed in this encounter.  The patient has a good understanding of the overall plan. she agrees with it. she will call with any problems that may develop before the next visit here. Total time spent: 30 mins including face to face time and time spent for planning, charting and co-ordination of care   Suzzette Righter, [place] 09/27/22    I Gardiner Coins am acting as a Education administrator for [employer]  ***

## 2022-09-29 ENCOUNTER — Inpatient Hospital Stay: Payer: Commercial Managed Care - PPO | Admitting: Hematology and Oncology

## 2022-10-02 ENCOUNTER — Other Ambulatory Visit: Payer: Self-pay

## 2022-10-02 ENCOUNTER — Encounter: Payer: Self-pay | Admitting: Emergency Medicine

## 2022-10-02 ENCOUNTER — Ambulatory Visit: Payer: Commercial Managed Care - PPO | Admitting: Hematology and Oncology

## 2022-10-02 ENCOUNTER — Telehealth: Payer: Self-pay

## 2022-10-02 ENCOUNTER — Inpatient Hospital Stay: Payer: Commercial Managed Care - PPO | Admitting: Hematology and Oncology

## 2022-10-02 VITALS — BP 127/89 | HR 99 | Temp 97.9°F | Resp 17 | Wt 227.8 lb

## 2022-10-02 DIAGNOSIS — C7951 Secondary malignant neoplasm of bone: Secondary | ICD-10-CM | POA: Diagnosis not present

## 2022-10-02 DIAGNOSIS — Z170 Estrogen receptor positive status [ER+]: Secondary | ICD-10-CM

## 2022-10-02 DIAGNOSIS — C50112 Malignant neoplasm of central portion of left female breast: Secondary | ICD-10-CM

## 2022-10-02 MED ORDER — AZITHROMYCIN 250 MG PO TABS: ORAL_TABLET | ORAL | 0 refills | Status: DC

## 2022-10-02 NOTE — Research (Signed)
A Phase 3, Open-Label, Randomized, Two-Part Study Comparing Gedatolisib in Combination with Palbociclib and Fulvestrant to Standard-of-Care Therapies in Patients with HR-Positive, HER2-Negative Advanced Breast Cancer Previously Treated with a CDK4/6 Inhibitor in Combination with Non-Steroidal Aromatase Inhibitor Therapy Kym Groom)   INTRODUCTION TO STUDY/CONSENT  Patient Krystal [name] was identified by Dr. Lindi Adie as a potential candidate for the above listed study.  This Clinical Research Nurse met with Krystal [name], [id number], on 10/02/22 in a manner and location that ensures patient privacy to discuss participation in the above listed research study.  Patient is Accompanied by her spouse .  A copy of the informed consent document with embedded HIPAA language was provided to the patient.  Patient reads, speaks, and understands [place].   Patient was provided with the business card of this Nurse and encouraged to contact the research team with any questions.  Approximately 20 minutes were spent with the patient reviewing the informed consent documents.  Patient was provided the option of taking informed consent documents home to review and was encouraged to review at their convenience with their support network, including other care providers. Patient took the consent documents home to review.  Will f/u with patient in the next two days to determine interest.  Wells Guiles 'Learta CoddingNeysa Bonito, RN, BSN, [facility] Clinical Research Nurse I 10/02/22 10:02 AM

## 2022-10-02 NOTE — Assessment & Plan Note (Signed)
08/22/2019: PET CT scan: Metastatic disease in the chest with multiple lung nodules all subcentimeter size and hypermetabolic lymph nodes (right paratracheal node 7 mm, subcarinal node 1 cm, right hilar node 6 mm), retroperitoneal and upper pelvic common iliac lymph nodes, bone metastases especially L5, L4, T6, right 10th rib, left proximal femur   Bone biopsy was not felt to be reliable in addition to the effect of radiation on the bone. Palliative radiation completed 09/03/2019 [facility]: BRCA1 mutation, TMB 5.36, MSI: Normal   Treatment plan: Ibrance with letrozole and Xgeva for bone metastases switched to abemaciclib and Fulvestrant   Bone metastasis: Patient does not want to take bisphosphonates.    CT CAP 09/23/20: Slight int enl of lingular and LLL lung nodules, Stable bone mets CT CAP 04/04/2021: Increase in size of lymph nodes subcarinal lymph node 1.2 cm (previously 6 mm) right lower paratracheal lymph node 6 mm, interval enlargement of lingular 9 mm (previously 5 mm) and left lower lobe lung nodules 6 mm previously 4 mm, stable bone metastases right lateral rib, L5, S1  CT CAP 10/05/2021: Progressive sclerotic bone metastases, stable mediastinal and hilar nodes, increase in size of lingular and left lower lobe lung nodules CT CAP 01/10/2022: Stable bilateral pulmonary nodules, stable thickening left adrenal gland, no liver or peritoneal metastasis CT CAP 08/23/2022: New and enlarged pulmonary nodule consistent with progression.  Enlarged small low cervical and mediastinal nodes.  Multiple bone metastases minimally progressive.  Liver lesions evaluation is challenging because of hepatic steatosis with suspected liver progression, isolated tiny omental nodule  Ultrasound left supraclavicular biopsy 09/22/2022: Metastatic breast cancer ER 90%, PR 0%, Ki-67 60%, HER2 1+ Recommendation: [place] 1 clinical trial

## 2022-10-02 NOTE — Telephone Encounter (Signed)
Pt scheduled for MRI at [facility] 10/03/22 at 1900. She knows to arrive at 1830 at [facility] ED for check in.  Caris testing and all supporting documents faxed per MD. Fax confirmation received.

## 2022-10-03 ENCOUNTER — Telehealth: Payer: Self-pay | Admitting: Emergency Medicine

## 2022-10-03 ENCOUNTER — Ambulatory Visit (HOSPITAL_COMMUNITY)
Admission: RE | Admit: 2022-10-03 | Discharge: 2022-10-03 | Disposition: A | Discharge status: Home / Self Care | Payer: Commercial Managed Care - PPO | Source: Ambulatory Visit | Attending: Hematology and Oncology | Admitting: Hematology and Oncology

## 2022-10-03 DIAGNOSIS — C50112 Malignant neoplasm of central portion of left female breast: Secondary | ICD-10-CM | POA: Diagnosis present

## 2022-10-03 DIAGNOSIS — Z170 Estrogen receptor positive status [ER+]: Secondary | ICD-10-CM | POA: Diagnosis present

## 2022-10-03 MED ORDER — GADOBUTROL 1 MMOL/ML IV SOLN
10.0000 mL | Freq: Once | INTRAVENOUS | Status: AC | PRN
  Administered 2022-10-03: 10 mL via INTRAVENOUS

## 2022-10-03 NOTE — Telephone Encounter (Signed)
A Phase 3, Open-Label, Randomized, Two-Part Study Comparing Gedatolisib in Combination with Palbociclib and Fulvestrant to Standard-of-Care Therapies in Patients with HR-Positive, HER2-Negative Advanced Breast Cancer Previously Treated with a CDK4/6 Inhibitor in Combination with Non-Steroidal Aromatase Inhibitor Therapy Kym Groom)   Patient called to decline study participation, concern about potential side effects and time commitment particularly because she is still working.  Patient denies any questions or concerns at this time, aware to follow up as needed.  MD aware.  Wells Guiles 'Learta Codding' Neysa Bonito, RN, BSN, [facility] Clinical Research Nurse I 10/03/22 2:36 PM

## 2022-10-04 ENCOUNTER — Other Ambulatory Visit: Payer: Self-pay | Admitting: Radiation Therapy

## 2022-10-04 ENCOUNTER — Telehealth: Payer: Self-pay | Admitting: Hematology and Oncology

## 2022-10-04 ENCOUNTER — Other Ambulatory Visit: Payer: Self-pay | Admitting: General Surgery

## 2022-10-04 ENCOUNTER — Other Ambulatory Visit: Payer: Self-pay | Admitting: Hematology and Oncology

## 2022-10-04 ENCOUNTER — Telehealth: Payer: Self-pay | Admitting: Internal Medicine

## 2022-10-04 ENCOUNTER — Inpatient Hospital Stay (HOSPITAL_BASED_OUTPATIENT_CLINIC_OR_DEPARTMENT_OTHER): Payer: Commercial Managed Care - PPO | Admitting: Hematology and Oncology

## 2022-10-04 DIAGNOSIS — C50919 Malignant neoplasm of unspecified site of unspecified female breast: Secondary | ICD-10-CM

## 2022-10-04 MED ORDER — LIDOCAINE-PRILOCAINE 2.5-2.5 % EX CREA: TOPICAL_CREAM | CUTANEOUS | 3 refills | Status: AC

## 2022-10-04 MED ORDER — DEXAMETHASONE 4 MG PO TABS: ORAL_TABLET | ORAL | 1 refills | Status: DC

## 2022-10-04 MED ORDER — ONDANSETRON HCL 8 MG PO TABS: 8.0000 mg | ORAL_TABLET | Freq: Three times a day (TID) | ORAL | 1 refills | Status: DC | PRN

## 2022-10-04 MED ORDER — PROCHLORPERAZINE MALEATE 10 MG PO TABS: 10.0000 mg | ORAL_TABLET | Freq: Four times a day (QID) | ORAL | 1 refills | Status: AC | PRN

## 2022-10-04 NOTE — Telephone Encounter (Signed)
Contacted patient to scheduled appointments. Patient is aware of appointments that are scheduled.   

## 2022-10-04 NOTE — Progress Notes (Signed)
Pharmacist Chemotherapy Monitoring - Initial Assessment    Anticipated start date: 10/11/22   The following has been reviewed per standard work regarding the patient's treatment regimen: The patient's diagnosis, treatment plan and drug doses, and organ/hematologic function Lab orders and baseline tests specific to treatment regimen  The treatment plan start date, drug sequencing, and pre-medications Prior authorization status  Patient's documented medication list, including drug-drug interaction screen and prescriptions for anti-emetics and supportive care specific to the treatment regimen The drug concentrations, fluid compatibility, administration routes, and timing of the medications to be used The patient's access for treatment and lifetime cumulative dose history, if applicable  The patient's medication allergies and previous infusion related reactions, if applicable   Changes made to treatment plan:  N/A  Follow up needed:  Pending authorization for treatment    Kennith Center, Pharm.D., CPP 10/04/2022'@1'$ :13 PM

## 2022-10-04 NOTE — Progress Notes (Signed)
Patient Care Team: Patient, [name] [name], MD as Medical Oncologist (Hematology and Oncology) [name], MD as Consulting Physician (Radiation Oncology) [name], NP as Nurse Practitioner (Hematology and Oncology) Rolm Bookbinder, MD as Consulting Physician (General Surgery) [name], RPH-CPP as Pharmacist (Hematology and Oncology)  DIAGNOSIS:  Encounter Diagnosis  Name Primary?   Primary malignant neoplasm of breast with metastasis ([place]) Yes    SUMMARY OF ONCOLOGIC HISTORY: Oncology History  Cancer of central portion of left female breast ([place])  04/02/2014 Genetic Testing   BRCA1 [id number] pathogenic mutation identified on gene sequce and del/dup analyses of BRCA1 and BRCA2.  BRCA1 p.H1283R VUS identified as well.  The report date is 04/02/2014.  UPDATE: BRCA1 [id number] VUS has been reclassified as a likely benign variant.  The reclassification date is 04/21/2020.   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 N0 M0 stage IA: Oncotype DX 13 (ROR 9%)   04/16/2014 Surgery   Left upper back melanoma resected no residual melanoma identified on re-resection margins negative   05/26/2014 - 06/08/2015 Anti-estrogen oral therapy   Tamoxifen 20 mg daily with a plan to switch her to aromatase inhibitors once she gets oophorectomy ( patient did not continue antiestrogen therapy by choice)   08/13/2014 Surgery   oophorectomy with total abdominal hysterectomy   09/08/2015 -  Anti-estrogen oral therapy   Resumed anastrozole after she found recurrence of breast cancer; stopped in January 2018, started letrozole 2.5 mg daily 10/12/2016; switched to Exemestane 25 mg daily on 01/03/17   09/16/2015 Surgery   Skin, left: IDC grade 3; 0.5 cm, margins  negative, ER 95%, PR 10%, HER-2 positive ratio 2.08   11/08/2015 - 02/2017 Chemotherapy   Herceptin every 3 weeks plus anastrozole   11/24/2015 - 01/14/2016 Radiation Therapy   Adjuvant radiation therapy by Dr. Sondra Come   01/2017 -  Anti-estrogen oral therapy   Exemestane daily (stopped others due to side effects), due to muscle aches and pains switch to tamoxifen 5 mg daily starting 12/18/2017   08/07/2019 Relapse/Recurrence   Bone scan: Sclerotic metastatic lesions involving left femoral neck, right ninth rib and entire L5 vertebra; MRI left hip: Diffuse signal abnormality L5 vertebral body with possible extraosseous extension   08/22/2019 - 09/03/2019 Radiation Therapy   Palliative radiation with Dr. Sondra Come to L5 and left proixmal femur.    10/2021 Treatment Plan Change   Fulvestrant and Verzenio   08/23/2022 Imaging   CT CAP 08/23/2022: New and enlarged pulmonary nodule consistent with progression.  Enlarged small low cervical and mediastinal nodes.  Multiple bone metastases minimally progressive.  Liver lesions evaluation is challenging because of hepatic steatosis with suspected liver progression, isolated tiny omental nodule     09/07/2022 PET scan   IMPRESSION: 1. Worsening of disease now with nodal, pulmonary and hepatic metastases. 2. Areas of variable FDG uptake with some new bone lesions and some areas of bony metastasis which are more focal, for instance in the pelvis where there was likely diffuse involvement on prior imaging. Many of the areas of sclerosis do not show substantial uptake as outlined above and some areas show improvement/resolution of FDG uptake seen previously but there are signs of active bony metastatic disease currently. 3. Likely LEFT adrenal adenoma. 4. Severe hepatic steatosis. 5. Aortic atherosclerosis.  Primary malignant neoplasm of breast with metastasis ([place])  08/25/2019 Initial Diagnosis   Primary malignant neoplasm of breast with metastasis ([place])    10/11/2022 -  Chemotherapy   Patient is on Treatment Plan : BREAST METASTATIC Fam-Trastuzumab Deruxtecan-nxki (Enhertu) (5.4) q21d       CHIEF COMPLIANT: Follow-up to discuss the results of brain MRI and to finalize her treatment plan  INTERVAL HISTORY: Krystal [name] is a 57 year old above-mentioned cement site breast cancer who was previously on Verzinio and letrozole and Faslodex who showed evidence of progression of disease and we discussed different options including participation in a clinical trial as well as treatment with Enhertu.  She decided to not participate in a clinical trial.  She intends to start Enhertu next week.  She had intense headaches for which she underwent brain MRI that revealed couple of years of leptomeningeal enhancement as well as dural based skull metastases and the left globe was met concerning for metastatic disease.  We connected with her by phone to discuss these results.  She continues to have the chronic cough and she just started antibiotics yesterday.   ALLERGIES:  is allergic to dilaudid [hydromorphone] and codeine.  MEDICATIONS:  Current Outpatient Medications  Medication Sig Dispense Refill   aspirin EC 81 MG tablet Take 1 tablet (81 mg total) by mouth daily. Swallow whole. 90 tablet 3   aspirin-acetaminophen-caffeine (EXCEDRIN MIGRAINE) 250-250-65 MG tablet Take 2 tablets by mouth every 6 (six) hours as needed for headache.     azithromycin (ZITHROMAX Z-PAK) 250 MG tablet Use as directed 6 each 0   calcium-vitamin D (OSCAL WITH D) 500-200 MG-UNIT tablet Take 1 tablet by mouth 2 (two) times daily. 180 tablet 3   dexamethasone (DECADRON) 4 MG tablet Take 1 tablets (4 mg) by mouth daily for 3 days after chemotherapy. Take with food. 30 tablet 1   esomeprazole (NEXIUM) 40 MG capsule Take 1 capsule by mouth daily.     lidocaine-prilocaine (EMLA) cream Apply to affected area once 30 g 3   loperamide (IMODIUM) 2 MG capsule Take 2 mg by mouth as needed for  diarrhea or loose stools. Take 2 tablets at first diarrhea and then 1 tablet after additional loose stool. Max number in 24 hours is 8 tablets.     loratadine (CLARITIN) 10 MG tablet Take by mouth.     meloxicam (MOBIC) 15 MG tablet TAKE 1 TABLET(15 MG) BY MOUTH DAILY AS NEEDED FOR PAIN 30 tablet 1   ondansetron (ZOFRAN) 8 MG tablet Take 1 tablet (8 mg total) by mouth every 8 (eight) hours as needed for nausea or vomiting. 30 tablet 10   ondansetron (ZOFRAN) 8 MG tablet Take 1 tablet (8 mg total) by mouth every 8 (eight) hours as needed for nausea or vomiting. Start on the third day after chemotherapy. 30 tablet 1   prochlorperazine (COMPAZINE) 10 MG tablet Take 1 tablet (10 mg total) by mouth every 6 (six) hours as needed for nausea or vomiting. 30 tablet 2   prochlorperazine (COMPAZINE) 10 MG tablet Take 1 tablet (10 mg total) by mouth every 6 (six) hours as needed for nausea or vomiting. 30 tablet 1   rosuvastatin (CRESTOR) 10 MG tablet Take 1 tablet (10 mg total) by mouth daily. 90 tablet 3   traZODone (DESYREL) 50 MG tablet TAKE 1 TABLET(50 MG) BY MOUTH AT BEDTIME 30 tablet 11   venlafaxine XR (EFFEXOR-XR) 75 MG 24 hr capsule TAKE 1 CAPSULE(75 MG) BY MOUTH AT BEDTIME 90 capsule  3   No current facility-administered medications for this visit.    PHYSICAL EXAMINATION: ECOG PERFORMANCE STATUS: 1 - Symptomatic but completely ambulatory  There were no vitals filed for this visit. There were no vitals filed for this visit.    LABORATORY DATA:  I have reviewed the data as listed    Latest Ref Rng & Units 09/14/2022    8:40 AM 08/23/2022    8:39 AM 05/11/2022    2:29 PM  CMP  Glucose 70 - 99 mg/dL 97   120   BUN 6 - 20 mg/dL 15   13   Creatinine 0.44 - 1.00 mg/dL 0.92  0.90  0.82   Sodium 135 - 145 mmol/L 141   139   Potassium 3.5 - 5.1 mmol/L 4.2   4.2   Chloride 98 - 111 mmol/L 103   102   CO2 22 - 32 mmol/L 32   31   Calcium 8.9 - 10.3 mg/dL 9.4   9.3   Total Protein 6.5 - 8.1 g/dL  6.6   7.5   Total Bilirubin 0.3 - 1.2 mg/dL 0.6   0.3   Alkaline Phos 38 - 126 U/L 67   63   AST 15 - 41 U/L 26   37   ALT 0 - 44 U/L 32   41     Lab Results  Component Value Date   WBC 2.7 (L) 09/14/2022   HGB 12.4 09/14/2022   HCT 36.3 09/14/2022   MCV 98.6 09/14/2022   PLT 198 09/14/2022   NEUTROABS 1.6 (L) 09/14/2022    ASSESSMENT & PLAN:  Primary malignant neoplasm of breast with metastasis ([place]) 08/22/2019: PET CT scan: Metastatic disease in the chest with multiple lung nodules all subcentimeter size and hypermetabolic lymph nodes (right paratracheal node 7 mm, subcarinal node 1 cm, right hilar node 6 mm), retroperitoneal and upper pelvic common iliac lymph nodes, bone metastases especially L5, L4, T6, right 10th rib, left proximal femur   Bone biopsy was not felt to be reliable in addition to the effect of radiation on the bone. Palliative radiation completed 09/03/2019 [facility]: BRCA1 mutation, TMB 5.36, MSI: Normal   Prior treatment: Ibrance with letrozole and Xgeva for bone metastases switched to abemaciclib and Fulvestrant   Bone metastasis: Patient does not want to take bisphosphonates CT CAP 08/23/2022: New and enlarged pulmonary nodule consistent with progression.  Enlarged small low cervical and mediastinal nodes.  Multiple bone metastases minimally progressive.  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  Skull metastases, abnormal enhancement left globe  Plan: I sent a referral to Dr. Mickeal Skinner and Dr. Randa Ngo  at to discuss in the brain tumor board and determine if she would benefit from [facility].  Return to clinic to start her first treatment with Enhertu next week.    No orders of the defined types were placed in this encounter.  The patient has a good understanding of the overall plan. she agrees with it. she will call with any problems that may develop before the next visit here. Total time spent: 30 mins including face to face time and time spent for planning, charting and  co-ordination of care   Harriette Ohara, MD 10/04/22

## 2022-10-04 NOTE — Progress Notes (Signed)
START ON PATHWAY REGIMEN - Breast     A cycle is every 21 days:     Fam-trastuzumab deruxtecan-nxki   **Always confirm dose/schedule in your pharmacy ordering system**  Patient Characteristics: Distant Metastases or Locoregional Recurrent Disease - Unresected or Locally Advanced Unresectable Disease Progressing after Neoadjuvant and Local Therapies, HER2 Low/Negative, ER Negative, Chemotherapy, HER2 Low, Second Line, gBRCA Wildtype or Not a  Candidate for Molecular Targeted Therapy Therapeutic Status: Distant Metastases HER2 Status: Low ER Status: Negative (-) PR Status: Positive (+) Therapy Approach Indicated: Standard Chemotherapy/Endocrine Therapy Line of Therapy: Second Line Intent of Therapy: Non-Curative / Palliative Intent, Discussed with Patient

## 2022-10-04 NOTE — Telephone Encounter (Signed)
Scheduled appt per 2/28 staff msg. Pt is aware of appt date and time. Pt is aware to arrive 15 mins prior to appt time and to bring and updated insurance card. Pt is aware of appt location.

## 2022-10-04 NOTE — Telephone Encounter (Signed)
Treatment plan: Patient did not want to go through a clinical trial.  Therefore we decided to pursue systemic chemotherapy with Enhertu. She has a port placement on 10/10/2022. Plan to start Enhertu on 10/11/2022.  Persistent cough: Just started on antibiotic.

## 2022-10-04 NOTE — Assessment & Plan Note (Signed)
08/22/2019: PET CT scan: Metastatic disease in the chest with multiple lung nodules all subcentimeter size and hypermetabolic lymph nodes (right paratracheal node 7 mm, subcarinal node 1 cm, right hilar node 6 mm), retroperitoneal and upper pelvic common iliac lymph nodes, bone metastases especially L5, L4, T6, right 10th rib, left proximal femur   Bone biopsy was not felt to be reliable in addition to the effect of radiation on the bone. Palliative radiation completed 09/03/2019 [facility]: BRCA1 mutation, TMB 5.36, MSI: Normal   Prior treatment: Ibrance with letrozole and Xgeva for bone metastases switched to abemaciclib and Fulvestrant   Bone metastasis: Patient does not want to take bisphosphonates CT CAP 08/23/2022: New and enlarged pulmonary nodule consistent with progression.  Enlarged small low cervical and mediastinal nodes.  Multiple bone metastases minimally progressive.  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  Skull metastases, abnormal enhancement left globe  Plan: I sent a referral to Dr. Mickeal Skinner and Dr. Randa Ngo at to discuss in the brain tumor board and determine if she would benefit from [facility].  Return to clinic to start her first treatment with Enhertu next week.

## 2022-10-05 ENCOUNTER — Inpatient Hospital Stay: Payer: Commercial Managed Care - PPO | Admitting: Pharmacist

## 2022-10-05 ENCOUNTER — Inpatient Hospital Stay: Payer: Commercial Managed Care - PPO

## 2022-10-05 ENCOUNTER — Other Ambulatory Visit: Payer: Self-pay

## 2022-10-05 DIAGNOSIS — C50919 Malignant neoplasm of unspecified site of unspecified female breast: Secondary | ICD-10-CM

## 2022-10-05 DIAGNOSIS — C7951 Secondary malignant neoplasm of bone: Secondary | ICD-10-CM | POA: Diagnosis not present

## 2022-10-05 NOTE — Progress Notes (Signed)
[facility]       Telephone: [phone]?Fax: [phone]   Oncology Clinical Pharmacist Practitioner Initial Assessment  Krystal [name] is a 57 y.o. female with a diagnosis of breast cancer. They were contacted today via in-person visit.  Indication/Regimen Fam-trastuzumab deruxtecan-nxki  (Enhertu) is being used appropriately for treatment of breast cancer by Dr. [name].       Wt Readings from Last 1 Encounters:  10/02/22 227 lb 12.8 oz (103.3 kg)    Estimated body surface area is 2.16 meters squared as calculated from the following:   Height as of 09/22/22: '5\' 4"'$  (1.626 m).   Weight as of 10/02/22: 227 lb 12.8 oz (103.3 kg).  The dosing regimen is every 21 days until disease progression or unacceptable toxicity  Fam-trastuzumab deruxtecan-nxki (5.4 mg/kg) on Day 1  Dose Modifications None at this time   Allergies Allergies  Allergen Reactions   Dilaudid [Hydromorphone] Other (See Comments)    RESPIRATORY DEPRESSION   Codeine Nausea And Vomiting    Vitals    10/02/2022    8:23 AM 09/22/2022    3:10 PM 09/22/2022    3:00 PM  Oncology Vitals  Weight 103.329 kg    Weight (lbs) 227 lbs 13 oz    BMI 39.1 kg/m2   39.1 kg/m2    Temp 97.9 F (36.6 C) 98.5 F (36.9 C)   Pulse Rate 99 80 84  BP 127/89 132/72 130/74  Resp '17 15 14  '$ SpO2 98 % 96 % 96 %  BSA (m2) 2.16 m2   2.16 m2      Laboratory Data    Latest Ref Rng & Units 09/14/2022    8:40 AM 05/11/2022    2:29 PM 04/06/2022    2:55 PM  CBC EXTENDED  WBC 4.0 - 10.5 K/uL 2.7  3.7  2.7   RBC 3.87 - 5.11 MIL/uL 3.68  3.68  3.38   Hemoglobin 12.0 - 15.0 g/dL 12.4  12.3  11.5   HCT 36.0 - 46.0 % 36.3  36.6  33.3   Platelets 150 - 400 K/uL 198  290  184   NEUT# 1.7 - 7.7 K/uL 1.6  2.2  1.3   Lymph# 0.7 - 4.0 K/uL 0.7  0.9  0.9        Latest Ref Rng & Units 09/14/2022    8:40 AM 08/23/2022    8:39 AM 05/11/2022    2:29 PM  CMP  Glucose 70 - 99 mg/dL 97   120   BUN 6 - 20 mg/dL 15   13    Creatinine 0.44 - 1.00 mg/dL 0.92  0.90  0.82   Sodium 135 - 145 mmol/L 141   139   Potassium 3.5 - 5.1 mmol/L 4.2   4.2   Chloride 98 - 111 mmol/L 103   102   CO2 22 - 32 mmol/L 32   31   Calcium 8.9 - 10.3 mg/dL 9.4   9.3   Total Protein 6.5 - 8.1 g/dL 6.6   7.5   Total Bilirubin 0.3 - 1.2 mg/dL 0.6   0.3   Alkaline Phos 38 - 126 U/L 67   63   AST 15 - 41 U/L 26   37   ALT 0 - 44 U/L 32   41    No results found for: "MG" Lab Results  Component Value Date   CA2729 23.7 09/14/2022   CA2729 17.7 05/11/2022   CA2729 13.1 04/06/2022  Contraindications Contraindications were reviewed? Yes Contraindications to therapy were identified? No   Safety Precautions The following safety precautions for the use of fam-trastuzumab deruxtecan-nxki were reviewed:  Fever/neutropenia: reviewed the importance of having a thermometer and the Centers for Disease Control and Prevention (CDC) definition of fever which is 100.87F (38C) or higher. Patient should call 24/7 triage at [phone] if experiencing a fever or any other symptoms Decreased hemoglobin Decreased platelet count Nausea / Vomiting Liver function test elevations Fatigue Alopecia Electrolyte abnormalities Constipation Diarrhea Pain Decreased appetite Interstitial Lung Disease (ILD): reviewed possible symptoms which include cough, shortness of breast, or fever Left Ventricular Dysfunction -- Dr. Lindi Adie okay getting ECHO on 10/16/22 after start of treatment Handling body fluids and waste Intimacy, sexual activity, contraception, and fertility  Medication Reconciliation Current Outpatient Medications  Medication Sig Dispense Refill   aspirin EC 81 MG tablet Take 1 tablet (81 mg total) by mouth daily. Swallow whole. 90 tablet 3   aspirin-acetaminophen-caffeine (EXCEDRIN MIGRAINE) 250-250-65 MG tablet Take 2 tablets by mouth every 6 (six) hours as needed for headache.     azithromycin (ZITHROMAX Z-PAK) 250 MG tablet Use as  directed (Patient taking differently: Take 250 mg by mouth daily. Use as directed) 6 each 0   CALCIUM PO Take 1,200 mg by mouth 2 (two) times daily.     dexamethasone (DECADRON) 4 MG tablet Take 1 tablets (4 mg) by mouth daily for 3 days after chemotherapy. Take with food. 30 tablet 1   esomeprazole (NEXIUM) 40 MG capsule Take 40 mg by mouth daily.     lidocaine-prilocaine (EMLA) cream Apply to affected area once 30 g 3   loperamide (IMODIUM) 2 MG capsule Take 2 mg by mouth daily as needed for diarrhea or loose stools. Take 2 tablets at first diarrhea and then 1 tablet after additional loose stool. Max number in 24 hours is 8 tablets.     loratadine (CLARITIN) 10 MG tablet Take 10 mg by mouth daily.     meloxicam (MOBIC) 15 MG tablet TAKE 1 TABLET(15 MG) BY MOUTH DAILY AS NEEDED FOR PAIN (Patient taking differently: Take 15 mg by mouth daily.) 30 tablet 1   ondansetron (ZOFRAN) 8 MG tablet Take 1 tablet (8 mg total) by mouth every 8 (eight) hours as needed for nausea or vomiting. Start on the third day after chemotherapy. 30 tablet 1   prochlorperazine (COMPAZINE) 10 MG tablet Take 1 tablet (10 mg total) by mouth every 6 (six) hours as needed for nausea or vomiting. 30 tablet 1   rosuvastatin (CRESTOR) 10 MG tablet Take 1 tablet (10 mg total) by mouth daily. 90 tablet 3   traZODone (DESYREL) 50 MG tablet TAKE 1 TABLET(50 MG) BY MOUTH AT BEDTIME 30 tablet 11   venlafaxine XR (EFFEXOR-XR) 75 MG 24 hr capsule TAKE 1 CAPSULE(75 MG) BY MOUTH AT BEDTIME 90 capsule 3   No current facility-administered medications for this visit.   Medication reconciliation is based on the patient's most recent medication list in the electronic medical record (EMR) including herbal products and OTC medications.   The patient's medication list was reviewed today with the patient? Yes , removed ondansetron and prochlorperazine duplicates  Drug-drug interactions (DDIs) DDIs were evaluated? Yes Significant DDIs identified?  No   Drug-Food Interactions Drug-food interactions were evaluated? Yes Drug-food interactions identified? No   Follow-up Plan  Treatment start date: 10/11/22 Port placement date: 10/10/22 ECHO date: 10/16/22. Dr. Lindi Adie aware and okay to start treatment prior to ECHO. Has  also seen Dr. Haroldine Laws recently on 09/21/22. We reviewed prescriptions, premedications, and the treatment regimen with patient. Possible side effects of the treatment regimen were reviewed as well as management strategies. Clinical pharmacy will assist Dr. [name] and Krystal [name] on an as needed basis going forward  Krystal [name] participated in the discussion, expressed understanding, and voiced agreement with the above plan. All questions were answered to her satisfaction. The patient was advised to contact the clinic at [phone] with any questions or concerns prior to her return visit.   I spent 60 minutes assessing the patient.  [name], RPH-CPP, 10/05/2022 5:06 PM  **Disclaimer: This note was dictated with voice recognition software. Similar sounding words can inadvertently be transcribed and this note may contain transcription errors which may not have been corrected upon publication of note.**

## 2022-10-06 ENCOUNTER — Encounter (HOSPITAL_COMMUNITY): Payer: Self-pay | Admitting: General Surgery

## 2022-10-06 ENCOUNTER — Other Ambulatory Visit: Payer: Self-pay

## 2022-10-06 NOTE — Pre-Procedure Instructions (Signed)
PCP - NO PCP Cardiologist - Dr.Daniel Bensimhon   PPM/ICD - pt denies Device Orders -  Rep Notified -   EKG - 09/21/22 Stress Test - pt denies ECHO - 03/05/17. Cardiac Cath - pt denies  Sleep Study/CPAP - pt denies  Diabetic- type 2. Diet controlled.  Fasting Blood Sugar - pt denies checking blood sugar at home Checks Blood Sugar _____ times a day  Last dose of GLP1 agonist-  pt denies GLP1 instructions: n/a  Blood Thinner Instructions:pt denies Aspirin Instructions:Follow your surgeon's instructions on when to stop Aspirin.  If no instructions were given by your surgeon then you will need to call the office to get those instructions.     ERAS Protcol - yes  COVID TEST- n/a   Anesthesia review: YES. Secure chat sent to Karoline Caldwell, PA to review patient's CT results and last cardiologist office visit. Also,Patient is on Zpak last does will be on 'Sunday 10/08/22 for a "chronic cough", pt states she has had this cough for 3 months. Pt denies any other symptoms, fever, etc.. pt denies having covid for the past 3 months. Patient states the cough is slightly better now after taking the Zpak.    Patient verbally denies any shortness of breath, fever, cough and chest pain during phone call.     -------------  SDW INSTRUCTIONS given:   Your procedure is scheduled on 10/10/22.             Report to [facility]tr[facility] "A" at  1:30 pm, and check in at the Admitting office.             Call this number if you have problems the morning of surgery:             [phone]               Remember:             Do not eat or drink after midnight the night before your surgery                          Take these medicines the morning of surgery with A SIP OF WATER nexium, claritin   As of today, STOP taking any Aspirin (unless otherwise instructed by your surgeon) Aleve, Naproxen, Ibuprofen, Motrin, Advil, Goody's, BC's, all herbal medications, fish oil, and all vitamins, Mobic and excedrin  migraine.  Follow your surgeon's instructions on when to stop Aspirin.  If no instructions were given by your surgeon then you will need to call the office to get those instructions.      Diabetic instructions-pt does not check blood sugar at home.                      Do not wear jewelry, make up, or nail polish            Do not wear lotions, powders, perfumes/colognes, or deodorant.            Do not shave 48 hours prior to surgery.  Men may shave face and neck.            Do not bring valuables to the hospital.            [facility] is not responsible for any belongings or valuables.   Do NOT Smoke (Tobacco/Vaping) 24 hours prior to your procedure If you use a CPAP at night, you may bring  all equipment for your overnight stay.   Contacts, glasses, dentures or partials may not be worn into surgery.      For patients admitted to the hospital, discharge time will be determined by your treatment team.   Patients discharged the day of surgery will not be allowed to drive home, and someone needs to stay with them for 24 hours.     Special instructions:   [facility]- Preparing For Surgery  Oral Hygiene is also important to reduce your risk of infection.  Remember - BRUSH YOUR TEETH THE MORNING OF SURGERY WITH YOUR REGULAR TOOTHPASTE   Before surgery, you can play an important role. Because skin is not sterile, your skin needs to be as free of germs as possible. You can reduce the number of germs on your skin by washing with Dial (or any antibacterial) Soap before surgery.    Please do not use if you have an allergy to antibacterial soaps. If your skin becomes reddened/irritated stop using the Antibacterial Soap  Do not shave (including legs and underarms) for at least 48 hours prior to surgery. It is OK to shave your face.   Please follow these instructions carefully.              Shower the NIGHT BEFORE SURGERY and the MORNING OF SURGERY with (DIAL) Antibacterial Soap. Wash your  body and hair with your normal shampoo/soap then rinse. Using a clean wash cloth wash from your neck down using the antibacterial soap. Wash thoroughly, paying special attention to the area where your surgery will be performed. Thoroughly rinse your body with warm water from the neck down.   Pat yourself dry with a CLEAN TOWEL.   Wear CLEAN PAJAMAS to bed the night before surgery.   Place CLEAN SHEETS on your bed the night of your surgery and DO NOT SLEEP WITH PETS.     Day of Surgery: Please shower morning of surgery using antibacterial soap Wear Clean/Comfortable clothing the morning of surgery Do not apply any deodorants/lotions.   Remember to brush your teeth WITH YOUR REGULAR TOOTHPASTE.   Questions were answered. Patient verbalized understanding of instructions.

## 2022-10-09 ENCOUNTER — Inpatient Hospital Stay: Payer: Commercial Managed Care - PPO | Attending: Hematology and Oncology

## 2022-10-09 ENCOUNTER — Telehealth: Payer: Self-pay | Admitting: General Practice

## 2022-10-09 ENCOUNTER — Telehealth: Payer: Self-pay | Admitting: Hematology and Oncology

## 2022-10-09 ENCOUNTER — Inpatient Hospital Stay: Payer: Commercial Managed Care - PPO | Admitting: Internal Medicine

## 2022-10-09 ENCOUNTER — Other Ambulatory Visit: Payer: Self-pay

## 2022-10-09 VITALS — BP 131/93 | HR 88 | Temp 98.1°F | Resp 20 | Wt 226.7 lb

## 2022-10-09 DIAGNOSIS — F17210 Nicotine dependence, cigarettes, uncomplicated: Secondary | ICD-10-CM | POA: Diagnosis not present

## 2022-10-09 DIAGNOSIS — K760 Fatty (change of) liver, not elsewhere classified: Secondary | ICD-10-CM | POA: Diagnosis not present

## 2022-10-09 DIAGNOSIS — I700 Atherosclerosis of aorta: Secondary | ICD-10-CM | POA: Diagnosis not present

## 2022-10-09 DIAGNOSIS — C7951 Secondary malignant neoplasm of bone: Secondary | ICD-10-CM | POA: Diagnosis present

## 2022-10-09 DIAGNOSIS — C7931 Secondary malignant neoplasm of brain: Secondary | ICD-10-CM | POA: Insufficient documentation

## 2022-10-09 DIAGNOSIS — Z7982 Long term (current) use of aspirin: Secondary | ICD-10-CM | POA: Insufficient documentation

## 2022-10-09 DIAGNOSIS — E119 Type 2 diabetes mellitus without complications: Secondary | ICD-10-CM | POA: Insufficient documentation

## 2022-10-09 DIAGNOSIS — Z170 Estrogen receptor positive status [ER+]: Secondary | ICD-10-CM | POA: Insufficient documentation

## 2022-10-09 DIAGNOSIS — Z1501 Genetic susceptibility to malignant neoplasm of breast: Secondary | ICD-10-CM | POA: Diagnosis not present

## 2022-10-09 DIAGNOSIS — Z5189 Encounter for other specified aftercare: Secondary | ICD-10-CM | POA: Diagnosis not present

## 2022-10-09 DIAGNOSIS — K219 Gastro-esophageal reflux disease without esophagitis: Secondary | ICD-10-CM | POA: Diagnosis not present

## 2022-10-09 DIAGNOSIS — C50919 Malignant neoplasm of unspecified site of unspecified female breast: Secondary | ICD-10-CM | POA: Diagnosis not present

## 2022-10-09 DIAGNOSIS — Z8616 Personal history of COVID-19: Secondary | ICD-10-CM | POA: Insufficient documentation

## 2022-10-09 DIAGNOSIS — Z85820 Personal history of malignant melanoma of skin: Secondary | ICD-10-CM | POA: Insufficient documentation

## 2022-10-09 DIAGNOSIS — Z79899 Other long term (current) drug therapy: Secondary | ICD-10-CM | POA: Insufficient documentation

## 2022-10-09 DIAGNOSIS — J101 Influenza due to other identified influenza virus with other respiratory manifestations: Secondary | ICD-10-CM | POA: Insufficient documentation

## 2022-10-09 DIAGNOSIS — Z9013 Acquired absence of bilateral breasts and nipples: Secondary | ICD-10-CM | POA: Insufficient documentation

## 2022-10-09 DIAGNOSIS — Z79811 Long term (current) use of aromatase inhibitors: Secondary | ICD-10-CM | POA: Insufficient documentation

## 2022-10-09 DIAGNOSIS — C50112 Malignant neoplasm of central portion of left female breast: Secondary | ICD-10-CM | POA: Insufficient documentation

## 2022-10-09 DIAGNOSIS — Z5112 Encounter for antineoplastic immunotherapy: Secondary | ICD-10-CM | POA: Diagnosis not present

## 2022-10-09 NOTE — Progress Notes (Signed)
Anesthesia Chart Review:  Case: [id number] Date/Time: 10/10/22 1545   Procedure: INSERTION PORT-A-CATH   Anesthesia type: General   Pre-op diagnosis: BREAST CANCER   Location: [facility] OR ROOM 02 / [place] [facility]   Surgeons: Rolm Bookbinder, MD       DISCUSSION: Patient is a 57 year old female scheduled for the above procedure. She has had recent progression of metastatic breast cancer and chemotherapy/Enhertu recommended.   History includes smoking, breast cancer (left breast CA, s/p bilateral mastectomies and breast reconstruction 04/16/14; complex repair of chest for mastectomy incision dehiscence 05/15/14; placement of silicone implants [id number]; excision of recurrence left chest 09/16/15; s/p chemoradiation, Power Port 10/21/15-06/14/17; Bone metastatic disease 08/07/19, s/p radiation 2021; [place] & Verzenio 10/2021, disease progression with new pulmonary nodule 08/23/22), DM2, GERD, melanoma excision (04/16/14), hysterectomy (THA/BSO 08/13/14). She reported respiratory arrest associated with Dilaudid in the past, > 5 years ago.   She is followed by Dr. Haroldine Laws at the [facility], re-established 09/21/22 (previously last seen 03/05/17).  She also had coronary calcifications and cardiomegaly on CT imaging. She was not having any signs or symptoms of angina. He wrote, "Given lack of symptoms and concomitant metastatic CA, I think best option at this point is to proceed with medical management to prevent disease progression." Future echo planned to evaluate cardiomegaly and in light of cardiotoxicity from chemotherapy agents. Echo is scheduled for 10/16/22.   Her last visit with oncologist Dr. Lindi Adie was on 10/04/22. Left subclavian LN biopsy 09/22/22 consistent with metastatic breast cancer. She also has pulmonology mets on 08/23/22 CT.   She had had a chronic cough for "3 months". She had worsening of disease now with nodal, pulmonary and hepatic Metastases on PET scan last month. She has had a persistent  cough, which she reported as going on for 3 months. She was prescribed azithromycin to see if that would help, last dose 10/08/22. She denied other associated symptoms such as fever. She though cough may be some better after Zpack. 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  She is in need of Port-a-cath, so she can begin chemotherapy for progression of metastatic breast cancer. She is a same day work-up, so anesthesia team to evaluate on the day of surgery.    VS: LMP 07/06/2014  BP Readings from Last 3 Encounters:  10/09/22 (!) 131/93  10/02/22 127/89  09/22/22 132/72   Pulse Readings from Last 3 Encounters:  10/09/22 88  10/02/22 99  09/22/22 80    PROVIDERS: Patient, [name] [name], MD is HEM-ONC [name], MD is RAD-ONC Glori Bickers, MD is HF cardiologist ([facility])    LABS: Most recent results in [facility] include: Lab Results  Component Value Date   WBC 2.7 (L) 09/14/2022   HGB 12.4 09/14/2022   HCT 36.3 09/14/2022   PLT 198 09/14/2022   GLUCOSE 97 09/14/2022   ALT 32 09/14/2022   AST 26 09/14/2022   NA 141 09/14/2022   K 4.2 09/14/2022   CL 103 09/14/2022   CREATININE 0.92 09/14/2022   BUN 15 09/14/2022   CO2 32 09/14/2022   TSH 1.056 07/19/2016    IMAGES: MRI Brain 10/03/22: IMPRESSION: 1. 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 2. Skull metastases with associated dural tumor in the right frontal vertex region and left middle cranial fossa. 3. Abnormal enhancement in the left globe concerning for metastatic disease. 4. Innumerable foci of chronic cerebral  microhemorrhage, indeterminate though can be seen with cerebral amyloid angiopathy.  PET Scan 09/07/22: IMPRESSION: 1. Worsening of disease now with nodal, pulmonary and hepatic metastases. 2. Areas of variable FDG uptake with some new bone lesions and some areas of bony metastasis which are more focal, for instance in the pelvis where there was likely diffuse involvement on prior imaging. Many of the areas of sclerosis do not show substantial uptake as outlined above and some areas show improvement/resolution of FDG uptake seen previously but there are signs of active bony metastatic disease currently. 3. Likely LEFT adrenal adenoma. 4. Severe hepatic steatosis. 5. Aortic atherosclerosis.   CT Chest/abd/pelvis 08/23/22: IMPRESSION: CT CHEST IMPRESSION 1. New and enlarged pulmonary nodules, consistent with progressive pulmonary metastasis. 2. Enlarging small low cervical and mediastinal nodes which are suspicious for early nodal metastasis. 3. Multifocal sclerotic osseous metastasis, minimally progressive. 4. Coronary artery atherosclerosis. Aortic Atherosclerosis (ICD10-I70.0).   CT ABDOMEN AND PELVIS IMPRESSION 1. Although evaluation for liver lesions is challenging secondary to the extent of hepatic steatosis, suspect developing hepatic metastasis as detailed above. This could be confirmed with PET or dedicated pre and post contrast abdominal MRI. 2. Mild progression of sclerotic osseous metastasis. 3. Isolated enlarging tiny omental nodule, suspicious for peritoneal metastasis.    EKG: 09/21/22: Sinus tachycardia at 101 bpm Otherwise normal ECG When compared with ECG of 03-Jan-2016 08:45, PREVIOUS ECG IS PRESENT No significant change since last tracing Confirmed by Lauree Chandler [phone]) on 09/21/2022 1:41:01 PM   CV: Echo 03/05/17: Study Conclusions  - Left ventricle: The cavity size was normal. Wall thickness was    normal. Systolic function was normal. The  estimated ejection    fraction was in the range of 55% to 60%. Doppler parameters are    consistent with abnormal left ventricular relaxation (grade 1    diastolic dysfunction).  - Aortic valve: There was trivial regurgitation.  - Impressions: LS 10.9 GLS -17.1% (likely underestimated)   Impressions:  - LS 10.9 GLS -17.1% (likely underestimated)    Past Medical History:  Diagnosis Date   Arthritis    shoulders   BRCA1 positive    BRCA1 [id number]   Cancer ([place])    Chest wall mass [id number]   Complication of anesthesia    respiratory depression/arrest with Dilaudid   Cough with sputum 09/10/2015   clear sputum, per pt.   Dental crowns present    Diabetes mellitus without complication ([place])    type 2   Family history of Lynch syndrome    Family history of pancreatic cancer    GERD (gastroesophageal reflux disease)    takes OTC meds   Headache    History of breast cancer 2015   Stuffy and runny nose 09/10/2015   green drainage from nose, per pt.    Past Surgical History:  Procedure Laterality Date   ABDOMINAL HYSTERECTOMY     BREAST LUMPECTOMY Left 09/16/2015   Procedure: LEFT BREAST MASS EXCISION;  Surgeon: Rolm Bookbinder, MD;  Location: [facility];  Service: General;  Laterality: Left;   BREAST RECONSTRUCTION WITH PLACEMENT OF TISSUE EXPANDER AND FLEX HD (ACELLULAR HYDRATED DERMIS) Bilateral 04/16/2014   Procedure: BILATERAL BREAST RECONSTRUCTION WITH PLACEMENT OF TISSUE EXPANDER AND FLEX  HD (ACELLULAR HYDRATED DERMIS);  Surgeon: Irene Limbo, MD;  Location: [place];  Service: Plastics;  Laterality: Bilateral;   CESAREAN SECTION  1999; 2001; 2005   [place] AND CLOSURE WOUND Bilateral 05/15/2014   Procedure: DEBRIDEMENT AND CLOSURE OF BILATERAL MASECTOMY INCISIONS WITH BREAST EXPANSION;  Surgeon: Irene Limbo, MD;  Location: [place];  Service: Plastics;  Laterality:  Bilateral;   LAPAROSCOPIC ASSISTED VAGINAL HYSTERECTOMY N/A 08/13/2014   Procedure: OPEN LAPAROSCOPY, EXPLORATORY LAPAROTOMY, TOTAL ABDOMINAL HYSTERECTOMY WITH BILATERAL SALPINGECTOMY AND BILATERAL OOPHORECTOMY;  Surgeon: Darlyn Chamber, MD;  Location: [place];  Service: Gynecology;  Laterality: N/A;   LIPOSUCTION WITH LIPOFILLING Bilateral 11/13/2014   Procedure: LIPOFILLING TO BILATERAL CHEST ;  Surgeon: Irene Limbo, MD;  Location: [place];  Service: Plastics;  Laterality: Bilateral;   MELANOMA EXCISION N/A 04/16/2014   Procedure: WIDE LOCAL EXCISION OF BACK MELANOMA;  Surgeon: Rolm Bookbinder, MD;  Location: [place];  Service: General;  Laterality: N/A;   PORT-A-CATH REMOVAL Right 06/14/2017   Procedure: REMOVAL PORT-A-CATH;  Surgeon: Rolm Bookbinder, MD;  Location: [place];  Service: General;  Laterality: Right;   PORTACATH PLACEMENT Right 10/21/2015   Procedure: INSERTION PORT-A-CATH WITH ULTRASOUND ;  Surgeon: Rolm Bookbinder, MD;  Location: [place];  Service: General;  Laterality: Right;   REMOVAL OF BILATERAL TISSUE EXPANDERS WITH PLACEMENT OF BILATERAL BREAST IMPLANTS Bilateral 11/13/2014   Procedure: REMOVAL OF BILATERAL TISSUE EXPANDERS WITH PLACEMENT OF BILATERAL SILICONE IMPLANTS ;  Surgeon: Irene Limbo, MD;  Location: [place];  Service: Plastics;  Laterality: Bilateral;   SIMPLE MASTECTOMY WITH AXILLARY SENTINEL NODE BIOPSY Bilateral 04/16/2014   Procedure: BILATERAL SKIN SPARING  MASTECTOMIES WITH LEFT AXILLARY SENTINEL NODE BIOPSY;  Surgeon: Rolm Bookbinder, MD;  Location: [place];  Service: General;  Laterality: Bilateral;    MEDICATIONS: No current facility-administered medications for this encounter.    aspirin EC 81 MG tablet   aspirin-acetaminophen-caffeine (EXCEDRIN MIGRAINE) 250-250-65 MG tablet   azithromycin (ZITHROMAX Z-PAK) 250 MG  tablet   CALCIUM PO   esomeprazole (NEXIUM) 40 MG capsule   loperamide (IMODIUM) 2 MG capsule   loratadine (CLARITIN) 10 MG tablet   meloxicam (MOBIC) 15 MG tablet   rosuvastatin (CRESTOR) 10 MG tablet   traZODone (DESYREL) 50 MG tablet   venlafaxine XR (EFFEXOR-XR) 75 MG 24 hr capsule   dexamethasone (DECADRON) 4 MG tablet   lidocaine-prilocaine (EMLA) cream   ondansetron (ZOFRAN) 8 MG tablet   prochlorperazine (COMPAZINE) 10 MG tablet    Myra Gianotti, PA-C Surgical Short Stay/Anesthesiology [facility] Phone [phone] [facility] Phone [phone] 10/09/2022 12:30 PM

## 2022-10-09 NOTE — Anesthesia Preprocedure Evaluation (Signed)
Anesthesia Evaluation  Patient identified by MRN, date of birth, ID band Patient awake    Reviewed: Allergy & Precautions, NPO status , Patient's Chart, lab work & pertinent test results  Airway Mallampati: II  TM Distance: >3 FB Neck ROM: Full    Dental  (+) Dental Advisory Given, Missing   Pulmonary Current Smoker and Patient abstained from smoking.   Pulmonary exam normal breath sounds clear to auscultation       Cardiovascular negative cardio ROS Normal cardiovascular exam Rhythm:Regular Rate:Normal     Neuro/Psych  Headaches  negative psych ROS   GI/Hepatic Neg liver ROS,GERD  Medicated,,  Endo/Other  diabetes, Well Controlled, Type 2  Obesity   Renal/GU negative Renal ROS     Musculoskeletal  (+) Arthritis ,    Abdominal   Peds  Hematology negative hematology ROS (+)   Anesthesia Other Findings Day of surgery medications reviewed with the patient.  BREAST CANCER  Reproductive/Obstetrics                             Anesthesia Physical Anesthesia Plan  ASA: 2  Anesthesia Plan: General   Post-op Pain Management: Tylenol PO (pre-op)*   Induction: Intravenous  PONV Risk Score and Plan: 2 and Midazolam, Dexamethasone and Ondansetron  Airway Management Planned: LMA  Additional Equipment:   Intra-op Plan:   Post-operative Plan: Extubation in OR  Informed Consent: I have reviewed the patients History and Physical, chart, labs and discussed the procedure including the risks, benefits and alternatives for the proposed anesthesia with the patient or authorized representative who has indicated his/her understanding and acceptance.     Dental advisory given  Plan Discussed with: CRNA  Anesthesia Plan Comments: (PAT note written 10/09/2022 by Myra Gianotti, PA-C.  )       Anesthesia Quick Evaluation

## 2022-10-09 NOTE — Telephone Encounter (Signed)
Rescheduled and scheduled appointments per WQ and patients requests. Patient is aware of the changes made to her upcoming appointments.

## 2022-10-09 NOTE — Progress Notes (Signed)
[facility] [place], [place] [address] [phone]   New Patient Evaluation  Date of Service: 10/09/22 Patient Name: Krystal [name] Patient MRN: [id number] Patient DOB: [birth year] Provider: Ventura Sellers, MD  Identifying Statement:  Krystal [name] is a 57 y.o. female with Breast cancer metastasized to brain, unspecified laterality [facility]) - Plan: MR [place] who presents for initial consultation and evaluation regarding cancer associated neurologic deficits.    Referring Provider: Ventura Sellers, MD [place],  [place] [address]  Primary Cancer:  Oncologic History: Oncology History  Cancer of central portion of left female breast ([place])  04/02/2014 Genetic Testing   BRCA1 [id number] pathogenic mutation identified on gene sequce and del/dup analyses of BRCA1 and BRCA2.  BRCA1 p.[id number] VUS identified as well.  The report date is 04/02/2014.  UPDATE: BRCA1 [id number] VUS has been reclassified as a likely benign variant.  The reclassification date is 04/21/2020.   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 N0 M0 stage IA: Oncotype DX 13 (ROR 9%)   04/16/2014 Surgery   Left upper back melanoma resected no residual melanoma identified on re-resection margins negative   05/26/2014 - 06/08/2015 Anti-estrogen oral therapy   Tamoxifen 20 mg daily with a plan to switch her to aromatase inhibitors once she gets oophorectomy ( patient did not continue antiestrogen therapy by choice)   08/13/2014 Surgery   oophorectomy with total abdominal hysterectomy   09/08/2015 -  Anti-estrogen oral therapy   Resumed anastrozole after she found recurrence of breast cancer; stopped in January 2018, started letrozole 2.5 mg daily 10/12/2016; switched to Exemestane  25 mg daily on 01/03/17   09/16/2015 Surgery   Skin, left: IDC grade 3; 0.5 cm, margins negative, ER 95%, PR 10%, HER-2 positive ratio 2.08   11/08/2015 - 02/2017 Chemotherapy   Herceptin every 3 weeks plus anastrozole   11/24/2015 - 01/14/2016 Radiation Therapy   Adjuvant radiation therapy by Dr. Sondra Come   01/2017 -  Anti-estrogen oral therapy   Exemestane daily (stopped others due to side effects), due to muscle aches and pains switch to tamoxifen 5 mg daily starting 12/18/2017   08/07/2019 Relapse/Recurrence   Bone scan: Sclerotic metastatic lesions involving left femoral neck, right ninth rib and entire L5 vertebra; MRI left hip: Diffuse signal abnormality L5 vertebral body with possible extraosseous extension   08/22/2019 - 09/03/2019 Radiation Therapy   Palliative radiation with Dr. Sondra Come to L5 and left proixmal femur.    10/2021 Treatment Plan Change   Fulvestrant and Verzenio   08/23/2022 Imaging   CT CAP 08/23/2022: New and enlarged pulmonary nodule consistent with progression.  Enlarged small low cervical and mediastinal nodes.  Multiple bone metastases minimally progressive.  Liver lesions evaluation is challenging because of hepatic steatosis with suspected liver progression, isolated tiny omental nodule     09/07/2022 PET scan   IMPRESSION: 1. Worsening of disease now with nodal, pulmonary and hepatic metastases. 2. Areas of variable FDG uptake with some new bone lesions and some areas of bony metastasis which are more focal, for instance in the pelvis where there was likely diffuse involvement on prior imaging. Many of the areas of sclerosis do not show substantial uptake as outlined above and some areas show improvement/resolution of FDG uptake seen previously but there  are signs of active bony metastatic disease currently. 3. Likely LEFT adrenal adenoma. 4. Severe hepatic steatosis. 5. Aortic atherosclerosis.   Primary malignant neoplasm of breast with metastasis ([place])   08/25/2019 Initial Diagnosis   Primary malignant neoplasm of breast with metastasis ([place])   10/11/2022 -  Chemotherapy   Patient is on Treatment Plan : BREAST METASTATIC Fam-Trastuzumab Deruxtecan-nxki (Enhertu) (5.4) q21d       History of Present Illness: The patient's records from the referring physician were obtained and reviewed and the patient interviewed to confirm this HPI.  Krystal [name] presents to review recent abnormal brain MRI.  She presented with short history of left sided facial numbness; this prompted MRI brain through Dr. Lindi Adie.  Breast cancer has been progressive, she is scheduled to initiate therapy with Enhertu later this week.  She also describes migraine headaches with aura, this has been presents for at least 4-5 years.  This might be "a little worse" in recent weeks.  Otherwise is functionally independent with activities of daily living.  Medications: Current Outpatient Medications on File Prior to Visit  Medication Sig Dispense Refill   aspirin EC 81 MG tablet Take 1 tablet (81 mg total) by mouth daily. Swallow whole. 90 tablet 3   aspirin-acetaminophen-caffeine (EXCEDRIN MIGRAINE) 250-250-65 MG tablet Take 2 tablets by mouth every 6 (six) hours as needed for headache.     CALCIUM PO Take 1,200 mg by mouth 2 (two) times daily.     esomeprazole (NEXIUM) 40 MG capsule Take 40 mg by mouth daily.     loperamide (IMODIUM) 2 MG capsule Take 2 mg by mouth daily as needed for diarrhea or loose stools. Take 2 tablets at first diarrhea and then 1 tablet after additional loose stool. Max number in 24 hours is 8 tablets.     loratadine (CLARITIN) 10 MG tablet Take 10 mg by mouth daily.     meloxicam (MOBIC) 15 MG tablet TAKE 1 TABLET(15 MG) BY MOUTH DAILY AS NEEDED FOR PAIN (Patient taking differently: Take 15 mg by mouth daily.) 30 tablet 1   ondansetron (ZOFRAN) 8 MG tablet Take 1 tablet (8 mg total) by mouth every 8 (eight) hours as needed for nausea or vomiting. Start on the  third day after chemotherapy. 30 tablet 1   prochlorperazine (COMPAZINE) 10 MG tablet Take 1 tablet (10 mg total) by mouth every 6 (six) hours as needed for nausea or vomiting. 30 tablet 1   rosuvastatin (CRESTOR) 10 MG tablet Take 1 tablet (10 mg total) by mouth daily. 90 tablet 3   traZODone (DESYREL) 50 MG tablet TAKE 1 TABLET(50 MG) BY MOUTH AT BEDTIME 30 tablet 11   venlafaxine XR (EFFEXOR-XR) 75 MG 24 hr capsule TAKE 1 CAPSULE(75 MG) BY MOUTH AT BEDTIME 90 capsule 3   dexamethasone (DECADRON) 4 MG tablet Take 1 tablets (4 mg) by mouth daily for 3 days after chemotherapy. Take with food. (Patient not taking: Reported on 10/09/2022) 30 tablet 1   lidocaine-prilocaine (EMLA) cream Apply to affected area once (Patient not taking: Reported on 10/09/2022) 30 g 3   No current facility-administered medications on file prior to visit.    Allergies:  Allergies  Allergen Reactions   Dilaudid [Hydromorphone] Other (See Comments)    RESPIRATORY DEPRESSION   Codeine Nausea And Vomiting   Past Medical History:  Past Medical History:  Diagnosis Date   Arthritis    shoulders   BRCA1 positive    BRCA1 [id number]   Cancer ([place])  Chest wall mass [id number]   Complication of anesthesia    respiratory depression/arrest with Dilaudid   Cough with sputum 09/10/2015   clear sputum, per pt.   Dental crowns present    Diabetes mellitus without complication ([place])    type 2   Family history of Lynch syndrome    Family history of pancreatic cancer    GERD (gastroesophageal reflux disease)    takes OTC meds   Headache    History of breast cancer 2015   Stuffy and runny nose 09/10/2015   green drainage from nose, per pt.   Past Surgical History:  Past Surgical History:  Procedure Laterality Date   ABDOMINAL HYSTERECTOMY     BREAST LUMPECTOMY Left 09/16/2015   Procedure: LEFT BREAST MASS EXCISION;  Surgeon: Rolm Bookbinder, MD;  Location: [facility];  Service: General;  Laterality:  Left;   BREAST RECONSTRUCTION WITH PLACEMENT OF TISSUE EXPANDER AND FLEX HD (ACELLULAR HYDRATED DERMIS) Bilateral 04/16/2014   Procedure: BILATERAL BREAST RECONSTRUCTION WITH PLACEMENT OF TISSUE EXPANDER AND FLEX HD (ACELLULAR HYDRATED DERMIS);  Surgeon: Irene Limbo, MD;  Location: [place];  Service: Plastics;  Laterality: Bilateral;   CESAREAN SECTION  1999; 2001; 2005   [place] AND CLOSURE WOUND Bilateral 05/15/2014   Procedure: DEBRIDEMENT AND CLOSURE OF BILATERAL MASECTOMY INCISIONS WITH BREAST EXPANSION;  Surgeon: Irene Limbo, MD;  Location: [place];  Service: Plastics;  Laterality: Bilateral;   LAPAROSCOPIC ASSISTED VAGINAL HYSTERECTOMY N/A 08/13/2014   Procedure: OPEN LAPAROSCOPY, EXPLORATORY LAPAROTOMY, TOTAL ABDOMINAL HYSTERECTOMY WITH BILATERAL SALPINGECTOMY AND BILATERAL OOPHORECTOMY;  Surgeon: Darlyn Chamber, MD;  Location: [place];  Service: Gynecology;  Laterality: N/A;   LIPOSUCTION WITH LIPOFILLING Bilateral 11/13/2014   Procedure: LIPOFILLING TO BILATERAL CHEST ;  Surgeon: Irene Limbo, MD;  Location: [place];  Service: Plastics;  Laterality: Bilateral;   MELANOMA EXCISION N/A 04/16/2014   Procedure: WIDE LOCAL EXCISION OF BACK MELANOMA;  Surgeon: Rolm Bookbinder, MD;  Location: [place];  Service: General;  Laterality: N/A;   PORT-A-CATH REMOVAL Right 06/14/2017   Procedure: REMOVAL PORT-A-CATH;  Surgeon: Rolm Bookbinder, MD;  Location: [place];  Service: General;  Laterality: Right;   PORTACATH PLACEMENT Right 10/21/2015   Procedure: INSERTION PORT-A-CATH WITH ULTRASOUND ;  Surgeon: Rolm Bookbinder, MD;  Location: [place];  Service: General;  Laterality: Right;   REMOVAL OF BILATERAL TISSUE EXPANDERS WITH PLACEMENT OF BILATERAL BREAST IMPLANTS Bilateral 11/13/2014   Procedure: REMOVAL OF BILATERAL  TISSUE EXPANDERS WITH PLACEMENT OF BILATERAL SILICONE IMPLANTS ;  Surgeon: Irene Limbo, MD;  Location: [place];  Service: Plastics;  Laterality: Bilateral;   SIMPLE MASTECTOMY WITH AXILLARY SENTINEL NODE BIOPSY Bilateral 04/16/2014   Procedure: BILATERAL SKIN SPARING  MASTECTOMIES WITH LEFT AXILLARY SENTINEL NODE BIOPSY;  Surgeon: Rolm Bookbinder, MD;  Location: [place];  Service: General;  Laterality: Bilateral;   Social History:  Social History   Socioeconomic History   Marital status: Widowed    Spouse name: Not on file   Number of children: 3   Years of education: Not on file   Highest education level: Not on file  Occupational History   Occupation: Facilities manager: [employer]  Tobacco Use   Smoking status: Some Days    Packs/day: 0.00    Years: 0.00    Total pack years: 0.00  Types: Cigarettes    Last attempt to quit: 04/17/2014    Years since quitting: 8.4   Smokeless tobacco: Never   Tobacco comments:    smokes 1-2 cigs every week  Vaping Use   Vaping Use: Never used  Substance and Sexual Activity   Alcohol use: Yes    Comment: occasionally   Drug use: No   Sexual activity: Not on file  Other Topics Concern   Not on file  Social History Narrative   Not on file   Social Determinants of Health   Financial Resource Strain: Not on file  Food Insecurity: Not on file  Transportation Needs: Not on file  Physical Activity: Not on file  Stress: Not on file  Social Connections: Not on file  Intimate Partner Violence: Not on file   Family History:  Family History  Problem Relation Age of Onset   Other Father        COVID complications   Breast cancer Paternal Aunt        dx > 50   Pancreatic cancer Paternal Uncle 43       smoker; deceased   Heart disease Maternal Grandmother    Heart attack Maternal Grandfather    Breast cancer Paternal Grandmother        dx 68s; ? if had 2nd BC  in 42s; deceased 8s   Other Cousin        Lynch syndrome due to PMS2   BRCA 1/2 Cousin     Review of Systems: Constitutional: Doesn't report fevers, chills or abnormal weight loss Eyes: Doesn't report blurriness of vision Ears, nose, mouth, throat, and face: Doesn't report sore throat Respiratory: Doesn't report cough, dyspnea or wheezes Cardiovascular: Doesn't report palpitation, chest discomfort  Gastrointestinal:  Doesn't report nausea, constipation, diarrhea GU: Doesn't report incontinence Skin: Doesn't report skin rashes Neurological: Per HPI Musculoskeletal: Doesn't report joint pain Behavioral/Psych: Doesn't report anxiety  Physical Exam: Vitals:   10/09/22 1140  BP: (!) 131/93  Pulse: 88  Resp: 20  Temp: 98.1 F (36.7 C)  SpO2: 95%   KPS: 90. General: Alert, cooperative, pleasant, in no acute distress Head: Normal EENT: No conjunctival injection or scleral icterus.  Lungs: Resp effort normal Cardiac: Regular rate Abdomen: Non-distended abdomen Skin: No rashes cyanosis or petechiae. Extremities: No clubbing or edema  Neurologic Exam: Mental Status: Awake, alert, attentive to examiner. Oriented to self and environment. Language is fluent with intact comprehension.  Cranial Nerves: Visual acuity is grossly normal. Visual fields are full. Extra-ocular movements intact. No ptosis. Face is symmetric Motor: Tone and bulk are normal. Power is full in both arms and legs. Reflexes are symmetric, no pathologic reflexes present.  Sensory: Impaired left V2 distribution Gait: Normal.   Labs: I have reviewed the data as listed    Component Value Date/Time   NA 141 09/14/2022 0840   NA 142 02/16/2017 1110   K 4.2 09/14/2022 0840   K 4.3 02/16/2017 1110   CL 103 09/14/2022 0840   CO2 32 09/14/2022 0840   CO2 27 02/16/2017 1110   GLUCOSE 97 09/14/2022 0840   GLUCOSE 190 (H) 02/16/2017 1110   BUN 15 09/14/2022 0840   BUN 7.9 02/16/2017 1110   CREATININE 0.92  09/14/2022 0840   CREATININE 0.8 02/16/2017 1110   CALCIUM 9.4 09/14/2022 0840   CALCIUM 9.4 02/16/2017 1110   PROT 6.6 09/14/2022 0840   PROT 7.4 02/16/2017 1110   ALBUMIN 4.0 09/14/2022 0840   ALBUMIN 3.7 02/16/2017  1110   AST 26 09/14/2022 0840   AST 27 02/16/2017 1110   ALT 32 09/14/2022 0840   ALT 50 02/16/2017 1110   ALKPHOS 67 09/14/2022 0840   ALKPHOS 88 02/16/2017 1110   BILITOT 0.6 09/14/2022 0840   BILITOT 0.41 02/16/2017 1110   GFRNONAA >60 09/14/2022 0840   GFRAA >60 03/15/2020 0926   Lab Results  Component Value Date   WBC 2.7 (L) 09/14/2022   NEUTROABS 1.6 (L) 09/14/2022   HGB 12.4 09/14/2022   HCT 36.3 09/14/2022   MCV 98.6 09/14/2022   PLT 198 09/14/2022    Imaging:  MR Brain W Wo Contrast  Result Date: 10/03/2022 CLINICAL DATA:  Metastatic disease evaluation; headaches and numbness in the face. History of breast cancer. EXAM: MRI HEAD WITHOUT AND WITH CONTRAST TECHNIQUE: Multiplanar, multiecho pulse sequences of the brain and surrounding structures were obtained without and with intravenous contrast. CONTRAST:  30m GADAVIST GADOBUTROL 1 MMOL/ML IV SOLN COMPARISON:  None Available. FINDINGS: Brain: A 1 cm focus of enhancement in the right perirolandic region appears centered on the central sulcus (series 15, image 125), and there is a 4 mm focus of enhancement more anteriorly in the right frontal lobe which also appears to be centered within a sulcus (series 15, image 102). There is a 1.8 cm enhancing, nodular dural lesion over the right frontal convexity adjacent to the superior sagittal sinus near the vertex associated with an overlying skull lesion (series 15, image 135), and there is also abnormal dural thickening and enhancement anterior to the left temporal lobe in the middle cranial fossa which is associated with a bone lesion involving the sphenoid wing (series 15, image 47). No acute infarct, midline shift, or extra-axial fluid collection is identified. The  ventricles are normal in size. Scattered small T2 hyperintensities in the cerebral white matter bilaterally are nonspecific but may reflect mild chronic small vessel ischemic disease. There are innumerable foci of chronic microhemorrhage located predominantly superficially throughout both cerebral hemispheres with sparing of the deep gray nuclei and cerebellum. Vascular: Major intracranial vascular flow voids are preserved. Skull and upper cervical spine: Right frontal and left middle cranial fossa bone lesions as noted above. Additional lesion involving the parietal skull just right of midline. Sinuses/Orbits: Abnormal enhancement in the posterior aspect of the left globe measuring up to 3 mm in AP thickness. Clear paranasal sinuses. Small left mastoid effusion. Other: None. IMPRESSION: 1. 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 2. Skull metastases with associated dural tumor in the right frontal vertex region and left middle cranial fossa. 3. Abnormal enhancement in the left globe concerning for metastatic disease. 4. Innumerable foci of chronic cerebral microhemorrhage, indeterminate though can be seen with cerebral amyloid angiopathy. Electronically Signed   By: ALogan BoresM.D.   On: 10/03/2022 19:52   [place]CORE BIOPSY (LYMPH NODES)  Result Date: 09/22/2022 INDICATION: 57year old female with history of metastatic breast cancer with suggestion of progression in multifocal metastatic sites on recent PET-CT including the liver and bilateral supraclavicular lymph nodes. EXAM: Ultrasound-guided left supraclavicular lymph node biopsy MEDICATIONS: None. ANESTHESIA/SEDATION: Moderate (conscious) sedation was employed during this procedure. A total of Versed 4 mg and Fentanyl 100 mcg was administered intravenously. Moderate Sedation Time: 15 minutes. The patient's level of consciousness and vital signs were  monitored continuously by radiology nursing throughout the procedure under my direct supervision. FLUOROSCOPY TIME:  None COMPLICATIONS: None immediate.  PROCEDURE: Informed written consent was obtained from the patient after a thorough discussion of the procedural risks, benefits and alternatives. All questions were addressed. Maximal Sterile Barrier Technique was utilized including caps, mask, sterile gowns, sterile gloves, sterile drape, hand hygiene and skin antiseptic. A timeout was performed prior to the initiation of the procedure. The left neck was prepped and draped in standard fashion. Preprocedure ultrasound evaluation demonstrated irregular shaped, prominent left supraclavicular lymph node measuring approximately 2.2 x 0.7 cm. Procedure was planned. Subdermal Local anesthesia was provided at the planned needle entry site. Deeper local anesthetic was administered under ultrasound guidance to the periphery of the lymph node. A small skin nick was made. Under direct ultrasound visualization, a 17 gauge coaxial introducer needle was directed to the periphery of the lymph node. Next, a total of 3, 18 gauge core biopsy samples were obtained and placed in formalin. The coaxial needle was removed. Hemostasis was achieved with brief manual compression. Postprocedure ultrasound evaluation demonstrated no evidence of surrounding hematoma or other complicating features. The patient tolerated the procedure well was discharged home in good condition. IMPRESSION: Technically successful ultrasound-guided left supraclavicular lymph node biopsy. Ruthann Cancer, MD [facility] Radiology Electronically Signed   By: Ruthann Cancer M.D.   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  Small overall burden of disease does lead to some uncertainty.  We recommended proceeding with planned systemic therapy, Enhertu.  We will repeat brain MRI with 3T magnet and SRS protocol fine cuts for better characterization of CNS burden and tumor location.    We also discussed CSF analysis, spine MRI study.  We will defer each of these for now.  Will defer steroids as well.  We spent twenty additional minutes teaching regarding the natural history, biology, and historical experience in the treatment of neurologic complications of cancer.   We appreciate the opportunity to participate in the care of Anjoli Marsili.    We ask that Jameria Jou return to clinic in 1 months following next brain MRI, or sooner as needed.  All questions were answered. The patient knows to call the clinic with any problems, questions or concerns. No barriers to learning were detected.  The total time spent in the encounter was 40 minutes and more than 50% was on counseling and review of test results   Ventura Sellers, MD Medical Director of Neuro-Oncology [facility] at [place] 10/09/22 2:44 PM

## 2022-10-09 NOTE — Telephone Encounter (Signed)
Per 3/4 LOS scheduled patient for follow up, left voicemail.

## 2022-10-10 ENCOUNTER — Ambulatory Visit (HOSPITAL_COMMUNITY): Payer: Commercial Managed Care - PPO | Admitting: Vascular Surgery

## 2022-10-10 ENCOUNTER — Ambulatory Visit (HOSPITAL_BASED_OUTPATIENT_CLINIC_OR_DEPARTMENT_OTHER): Payer: Commercial Managed Care - PPO | Admitting: Vascular Surgery

## 2022-10-10 ENCOUNTER — Ambulatory Visit (HOSPITAL_COMMUNITY): Payer: Commercial Managed Care - PPO

## 2022-10-10 ENCOUNTER — Encounter (HOSPITAL_COMMUNITY)
Admission: RE | Disposition: A | Discharge status: Home / Self Care | Payer: Self-pay | Source: Home / Self Care | Attending: General Surgery

## 2022-10-10 ENCOUNTER — Ambulatory Visit (HOSPITAL_COMMUNITY)
Admission: RE | Admit: 2022-10-10 | Discharge: 2022-10-10 | Disposition: A | Discharge status: Home / Self Care | Payer: Commercial Managed Care - PPO | Attending: General Surgery | Admitting: General Surgery

## 2022-10-10 ENCOUNTER — Other Ambulatory Visit: Payer: Self-pay

## 2022-10-10 ENCOUNTER — Encounter (HOSPITAL_COMMUNITY): Payer: Self-pay | Admitting: General Surgery

## 2022-10-10 DIAGNOSIS — Z90722 Acquired absence of ovaries, bilateral: Secondary | ICD-10-CM | POA: Insufficient documentation

## 2022-10-10 DIAGNOSIS — Z90710 Acquired absence of both cervix and uterus: Secondary | ICD-10-CM | POA: Diagnosis not present

## 2022-10-10 DIAGNOSIS — Z9221 Personal history of antineoplastic chemotherapy: Secondary | ICD-10-CM | POA: Insufficient documentation

## 2022-10-10 DIAGNOSIS — C787 Secondary malignant neoplasm of liver and intrahepatic bile duct: Secondary | ICD-10-CM | POA: Insufficient documentation

## 2022-10-10 DIAGNOSIS — Z6838 Body mass index (BMI) 38.0-38.9, adult: Secondary | ICD-10-CM | POA: Insufficient documentation

## 2022-10-10 DIAGNOSIS — C7951 Secondary malignant neoplasm of bone: Secondary | ICD-10-CM | POA: Insufficient documentation

## 2022-10-10 DIAGNOSIS — C50911 Malignant neoplasm of unspecified site of right female breast: Secondary | ICD-10-CM

## 2022-10-10 DIAGNOSIS — Z923 Personal history of irradiation: Secondary | ICD-10-CM | POA: Diagnosis not present

## 2022-10-10 DIAGNOSIS — Z9013 Acquired absence of bilateral breasts and nipples: Secondary | ICD-10-CM | POA: Insufficient documentation

## 2022-10-10 DIAGNOSIS — K219 Gastro-esophageal reflux disease without esophagitis: Secondary | ICD-10-CM | POA: Insufficient documentation

## 2022-10-10 DIAGNOSIS — E119 Type 2 diabetes mellitus without complications: Secondary | ICD-10-CM | POA: Diagnosis not present

## 2022-10-10 DIAGNOSIS — C50912 Malignant neoplasm of unspecified site of left female breast: Secondary | ICD-10-CM | POA: Insufficient documentation

## 2022-10-10 DIAGNOSIS — C7800 Secondary malignant neoplasm of unspecified lung: Secondary | ICD-10-CM | POA: Insufficient documentation

## 2022-10-10 DIAGNOSIS — C779 Secondary and unspecified malignant neoplasm of lymph node, unspecified: Secondary | ICD-10-CM | POA: Diagnosis not present

## 2022-10-10 DIAGNOSIS — Z85820 Personal history of malignant melanoma of skin: Secondary | ICD-10-CM | POA: Insufficient documentation

## 2022-10-10 DIAGNOSIS — Z9079 Acquired absence of other genital organ(s): Secondary | ICD-10-CM | POA: Diagnosis not present

## 2022-10-10 DIAGNOSIS — F17210 Nicotine dependence, cigarettes, uncomplicated: Secondary | ICD-10-CM | POA: Diagnosis not present

## 2022-10-10 DIAGNOSIS — E669 Obesity, unspecified: Secondary | ICD-10-CM | POA: Diagnosis not present

## 2022-10-10 HISTORY — PX: PORTACATH PLACEMENT: SHX2246

## 2022-10-10 HISTORY — DX: Headache, unspecified: R51.9

## 2022-10-10 LAB — GLUCOSE, CAPILLARY
Glucose-Capillary: 126 mg/dL — ABNORMAL HIGH (ref 70–99)
Glucose-Capillary: 120 mg/dL — ABNORMAL HIGH (ref 70–99)

## 2022-10-10 SURGERY — INSERTION, TUNNELED CENTRAL VENOUS DEVICE, WITH PORT
Anesthesia: General

## 2022-10-10 MED ORDER — TRAMADOL HCL 50 MG PO TABS: 50.0000 mg | ORAL_TABLET | Freq: Four times a day (QID) | ORAL | 0 refills | Status: DC | PRN

## 2022-10-10 MED ORDER — OXYCODONE HCL 5 MG PO TABS
ORAL_TABLET | ORAL | Status: AC
  Filled 2022-10-10: qty 1

## 2022-10-10 MED ORDER — CHLORHEXIDINE GLUCONATE 0.12 % MT SOLN
OROMUCOSAL | Status: AC
  Administered 2022-10-10: 15 mL via OROMUCOSAL
  Filled 2022-10-10: qty 15

## 2022-10-10 MED ORDER — CEFAZOLIN SODIUM-DEXTROSE 2-4 GM/100ML-% IV SOLN
2.0000 g | INTRAVENOUS | Status: AC
  Administered 2022-10-10: 2 g via INTRAVENOUS

## 2022-10-10 MED ORDER — FENTANYL CITRATE (PF) 100 MCG/2ML IJ SOLN
25.0000 ug | INTRAMUSCULAR | Status: DC | PRN
  Administered 2022-10-10: 25 ug via INTRAVENOUS

## 2022-10-10 MED ORDER — KETOROLAC TROMETHAMINE 15 MG/ML IJ SOLN
INTRAMUSCULAR | Status: DC | PRN
  Administered 2022-10-10: 15 mg via INTRAVENOUS

## 2022-10-10 MED ORDER — PHENYLEPHRINE 80 MCG/ML (10ML) SYRINGE FOR IV PUSH (FOR BLOOD PRESSURE SUPPORT)
PREFILLED_SYRINGE | INTRAVENOUS | Status: DC | PRN
  Administered 2022-10-10 (×2): 160 ug via INTRAVENOUS
  Administered 2022-10-10: 80 ug via INTRAVENOUS

## 2022-10-10 MED ORDER — MIDAZOLAM HCL 2 MG/2ML IJ SOLN
INTRAMUSCULAR | Status: DC | PRN
  Administered 2022-10-10 (×2): 2 mg via INTRAVENOUS

## 2022-10-10 MED ORDER — MIDAZOLAM HCL 2 MG/2ML IJ SOLN
INTRAMUSCULAR | Status: AC
  Filled 2022-10-10: qty 2

## 2022-10-10 MED ORDER — ENSURE PRE-SURGERY PO LIQD: 296.0000 mL | Freq: Once | ORAL | Status: DC

## 2022-10-10 MED ORDER — ONDANSETRON HCL 4 MG/2ML IJ SOLN
INTRAMUSCULAR | Status: DC | PRN
  Administered 2022-10-10: 4 mg via INTRAVENOUS

## 2022-10-10 MED ORDER — CHLORHEXIDINE GLUCONATE CLOTH 2 % EX PADS: 6.0000 | MEDICATED_PAD | Freq: Once | CUTANEOUS | Status: DC

## 2022-10-10 MED ORDER — 0.9 % SODIUM CHLORIDE (POUR BTL) OPTIME
TOPICAL | Status: DC | PRN
  Administered 2022-10-10: 1000 mL

## 2022-10-10 MED ORDER — PROPOFOL 10 MG/ML IV BOLUS
INTRAVENOUS | Status: DC | PRN
  Administered 2022-10-10: 60 mg via INTRAVENOUS
  Administered 2022-10-10: 40 mg via INTRAVENOUS
  Administered 2022-10-10: 160 mg via INTRAVENOUS

## 2022-10-10 MED ORDER — FENTANYL CITRATE (PF) 250 MCG/5ML IJ SOLN
INTRAMUSCULAR | Status: DC | PRN
  Administered 2022-10-10: 100 ug via INTRAVENOUS
  Administered 2022-10-10: 25 ug via INTRAVENOUS
  Administered 2022-10-10: 100 ug via INTRAVENOUS
  Administered 2022-10-10: 25 ug via INTRAVENOUS

## 2022-10-10 MED ORDER — LIDOCAINE 2% (20 MG/ML) 5 ML SYRINGE
INTRAMUSCULAR | Status: AC
  Filled 2022-10-10: qty 5

## 2022-10-10 MED ORDER — ACETAMINOPHEN 500 MG PO TABS
1000.0000 mg | ORAL_TABLET | ORAL | Status: AC
  Administered 2022-10-10: 1000 mg via ORAL

## 2022-10-10 MED ORDER — FENTANYL CITRATE (PF) 100 MCG/2ML IJ SOLN
INTRAMUSCULAR | Status: AC
  Filled 2022-10-10: qty 2

## 2022-10-10 MED ORDER — BUPIVACAINE-EPINEPHRINE (PF) 0.25% -1:200000 IJ SOLN
INTRAMUSCULAR | Status: AC
  Filled 2022-10-10: qty 30

## 2022-10-10 MED ORDER — LACTATED RINGERS IV SOLN: INTRAVENOUS | Status: DC

## 2022-10-10 MED ORDER — HEPARIN 6000 UNIT IRRIGATION SOLUTION
Status: AC
  Filled 2022-10-10: qty 500

## 2022-10-10 MED ORDER — PROPOFOL 10 MG/ML IV BOLUS
INTRAVENOUS | Status: AC
  Filled 2022-10-10: qty 20

## 2022-10-10 MED ORDER — CHLORHEXIDINE GLUCONATE 0.12 % MT SOLN: 15.0000 mL | Freq: Once | OROMUCOSAL | Status: AC

## 2022-10-10 MED ORDER — DEXAMETHASONE SODIUM PHOSPHATE 10 MG/ML IJ SOLN
INTRAMUSCULAR | Status: DC | PRN
  Administered 2022-10-10: 4 mg via INTRAVENOUS

## 2022-10-10 MED ORDER — HEPARIN SOD (PORK) LOCK FLUSH 100 UNIT/ML IV SOLN
INTRAVENOUS | Status: AC
  Filled 2022-10-10: qty 5

## 2022-10-10 MED ORDER — ORAL CARE MOUTH RINSE: 15.0000 mL | Freq: Once | OROMUCOSAL | Status: AC

## 2022-10-10 MED ORDER — PROMETHAZINE HCL 25 MG/ML IJ SOLN: 6.2500 mg | INTRAMUSCULAR | Status: DC | PRN

## 2022-10-10 MED ORDER — FENTANYL CITRATE (PF) 250 MCG/5ML IJ SOLN
INTRAMUSCULAR | Status: AC
  Filled 2022-10-10: qty 5

## 2022-10-10 MED ORDER — HEPARIN 6000 UNIT IRRIGATION SOLUTION
Status: DC | PRN
  Administered 2022-10-10: 1

## 2022-10-10 MED ORDER — CEFAZOLIN SODIUM-DEXTROSE 2-4 GM/100ML-% IV SOLN
INTRAVENOUS | Status: AC
  Filled 2022-10-10: qty 100

## 2022-10-10 MED ORDER — OXYCODONE HCL 5 MG PO TABS
5.0000 mg | ORAL_TABLET | Freq: Once | ORAL | Status: AC
  Administered 2022-10-10: 5 mg via ORAL

## 2022-10-10 MED ORDER — EPHEDRINE SULFATE-NACL 50-0.9 MG/10ML-% IV SOSY
PREFILLED_SYRINGE | INTRAVENOUS | Status: DC | PRN
  Administered 2022-10-10 (×2): 10 mg via INTRAVENOUS

## 2022-10-10 MED ORDER — ACETAMINOPHEN 500 MG PO TABS
ORAL_TABLET | ORAL | Status: AC
  Filled 2022-10-10: qty 2

## 2022-10-10 MED ORDER — BUPIVACAINE-EPINEPHRINE 0.25% -1:200000 IJ SOLN
INTRAMUSCULAR | Status: DC | PRN
  Administered 2022-10-10: 5 mL

## 2022-10-10 MED ORDER — LIDOCAINE 2% (20 MG/ML) 5 ML SYRINGE
INTRAMUSCULAR | Status: DC | PRN
  Administered 2022-10-10: 80 mg via INTRAVENOUS

## 2022-10-10 MED FILL — Dexamethasone Sodium Phosphate Inj 100 MG/10ML: INTRAMUSCULAR | Qty: 1 | Status: AC

## 2022-10-10 MED FILL — Fosaprepitant Dimeglumine For IV Infusion 150 MG (Base Eq): INTRAVENOUS | Qty: 5 | Status: AC

## 2022-10-10 SURGICAL SUPPLY — 47 items
ADH SKN CLS APL DERMABOND .7 (GAUZE/BANDAGES/DRESSINGS) ×1
ADH SKN CLS LQ APL DERMABOND (GAUZE/BANDAGES/DRESSINGS) ×1
APL PRP STRL LF DISP 70% ISPRP (MISCELLANEOUS) ×1
BAG COUNTER SPONGE SURGICOUNT (BAG) ×2 IMPLANT
BAG DECANTER FOR FLEXI CONT (MISCELLANEOUS) ×2 IMPLANT
BAG SPNG CNTER NS LX DISP (BAG) ×1
CHLORAPREP W/TINT 26 (MISCELLANEOUS) ×2 IMPLANT
COVER PROBE W GEL 5X96 (DRAPES) ×2 IMPLANT
COVER SURGICAL LIGHT HANDLE (MISCELLANEOUS) ×2 IMPLANT
DERMABOND ADVANCED .7 DNX12 (GAUZE/BANDAGES/DRESSINGS) ×2 IMPLANT
DERMABOND ADVANCED .7 DNX6 (GAUZE/BANDAGES/DRESSINGS) IMPLANT
DRAPE C-ARM 42X120 X-RAY (DRAPES) ×2 IMPLANT
DRAPE CHEST BREAST 15X10 FENES (DRAPES) ×2 IMPLANT
DRSG TEGADERM 4X4.5 CHG (GAUZE/BANDAGES/DRESSINGS) IMPLANT
DRSG TEGADERM 4X4.75 (GAUZE/BANDAGES/DRESSINGS) IMPLANT
ELECT CAUTERY BLADE 6.4 (BLADE) ×2 IMPLANT
ELECT REM PT RETURN 9FT ADLT (ELECTROSURGICAL) ×1
ELECTRODE REM PT RTRN 9FT ADLT (ELECTROSURGICAL) ×2 IMPLANT
GAUZE 4X4 16PLY ~~LOC~~+RFID DBL (SPONGE) ×2 IMPLANT
GEL ULTRASOUND 20GR AQUASONIC (MISCELLANEOUS) ×2 IMPLANT
GLOVE BIO SURGEON STRL SZ7 (GLOVE) ×2 IMPLANT
GLOVE BIOGEL PI IND STRL 7.5 (GLOVE) ×2 IMPLANT
GOWN STRL REUS W/ TWL LRG LVL3 (GOWN DISPOSABLE) ×4 IMPLANT
GOWN STRL REUS W/TWL LRG LVL3 (GOWN DISPOSABLE) ×2
INTRODUCER COOK 11FR (CATHETERS) IMPLANT
KIT BASIN OR (CUSTOM PROCEDURE TRAY) ×2 IMPLANT
KIT PORT POWER 8FR ISP CVUE (Port) ×2 IMPLANT
KIT TURNOVER KIT B (KITS) ×2 IMPLANT
NS IRRIG 1000ML POUR BTL (IV SOLUTION) ×2 IMPLANT
PAD ARMBOARD 7.5X6 YLW CONV (MISCELLANEOUS) ×4 IMPLANT
PENCIL BUTTON HOLSTER BLD 10FT (ELECTRODE) ×2 IMPLANT
POSITIONER HEAD DONUT 9IN (MISCELLANEOUS) ×2 IMPLANT
SET INTRODUCER 12FR PACEMAKER (INTRODUCER) IMPLANT
SET SHEATH INTRODUCER 10FR (MISCELLANEOUS) IMPLANT
SHEATH COOK PEEL AWAY SET 9F (SHEATH) IMPLANT
SPIKE FLUID TRANSFER (MISCELLANEOUS) ×2 IMPLANT
STRIP CLOSURE SKIN 1/2X4 (GAUZE/BANDAGES/DRESSINGS) ×2 IMPLANT
SUT MNCRL AB 4-0 PS2 18 (SUTURE) ×2 IMPLANT
SUT PROLENE 2 0 SH DA (SUTURE) ×2 IMPLANT
SUT SILK 2 0 (SUTURE)
SUT SILK 2-0 18XBRD TIE 12 (SUTURE) IMPLANT
SUT VIC AB 3-0 SH 27 (SUTURE) ×1
SUT VIC AB 3-0 SH 27XBRD (SUTURE) ×2 IMPLANT
SYR 5ML LUER SLIP (SYRINGE) ×2 IMPLANT
TOWEL GREEN STERILE (TOWEL DISPOSABLE) ×2 IMPLANT
TOWEL GREEN STERILE FF (TOWEL DISPOSABLE) ×2 IMPLANT
TRAY LAPAROSCOPIC MC (CUSTOM PROCEDURE TRAY) ×2 IMPLANT

## 2022-10-10 NOTE — Anesthesia Procedure Notes (Signed)
Procedure Name: LMA Insertion Date/Time: 10/10/2022 2:56 PM  Performed by: Reeves Dam, CRNAPre-anesthesia Checklist: Patient identified, Patient being monitored, Timeout performed, Emergency Drugs available and Suction available Patient Re-evaluated:Patient Re-evaluated prior to induction Oxygen Delivery Method: Circle system utilized Preoxygenation: Pre-oxygenation with 100% oxygen Induction Type: IV induction Ventilation: Mask ventilation without difficulty LMA: LMA inserted LMA Size: 4.0 Tube type: Oral Number of attempts: 1 Placement Confirmation: positive ETCO2 and breath sounds checked- equal and bilateral Tube secured with: Tape Dental Injury: Teeth and Oropharynx as per pre-operative assessment

## 2022-10-10 NOTE — Anesthesia Postprocedure Evaluation (Signed)
Anesthesia Post Note  Patient: Krystal [name]  Procedure(s) Performed: INSERTION PORT-A-CATH     Patient location during evaluation: PACU Anesthesia Type: General Level of consciousness: awake and alert Pain management: pain level controlled Vital Signs Assessment: post-procedure vital signs reviewed and stable Respiratory status: spontaneous breathing, nonlabored ventilation, respiratory function stable and patient connected to nasal cannula oxygen Cardiovascular status: blood pressure returned to baseline and stable Postop Assessment: no apparent nausea or vomiting Anesthetic complications: no   No notable events documented.  Last Vitals:  Vitals:   10/10/22 1644 10/10/22 1645  BP:  121/74  Pulse: 87 87  Resp: 16 15  Temp:  36.4 C  SpO2: 96% 95%    Last Pain:  Vitals:   10/10/22 1644  TempSrc:   PainSc: [place] Kennan Detter

## 2022-10-10 NOTE — Interval H&P Note (Signed)
History and Physical Interval Note:  10/10/2022 2:23 PM  Krystal [name]  has presented today for surgery, with the diagnosis of BREAST CANCER.  The various methods of treatment have been discussed with the patient and family. After consideration of risks, benefits and other options for treatment, the patient has consented to  Procedure(s): INSERTION PORT-A-CATH (N/A) as a surgical intervention.  The patient's history has been reviewed, patient examined, no change in status, stable for surgery.  I have reviewed the patient's chart and labs.  Questions were answered to the patient's satisfaction.     Rolm Bookbinder

## 2022-10-10 NOTE — Discharge Instructions (Signed)
PORT-A-CATH: POST OP INSTRUCTIONS  Always review your discharge instruction sheet given to you by the facility where your surgery was performed.   A prescription for pain medication may be given to you upon discharge. Take your pain medication as prescribed, if needed. If narcotic pain medicine is not needed, then you make take acetaminophen (Tylenol) or ibuprofen (Advil) as needed.  Take your usually prescribed medications unless otherwise directed. If you need a refill on your pain medication, please contact our office. All narcotic pain medicine now requires a paper prescription.  Phoned in and fax refills are no longer allowed by law.  Prescriptions will not be filled after 5 pm or on weekends.  You should follow a light diet for the remainder of the day after your procedure. Most patients will experience some mild swelling and/or bruising in the area of the incision. It may take several days to resolve. It is common to experience some constipation if taking pain medication after surgery. Increasing fluid intake and taking a stool softener (such as Colace) will usually help or prevent this problem from occurring. A mild laxative (Milk of Magnesia or Miralax) should be taken according to package directions if there are no bowel movements after 48 hours.  Unless discharge instructions indicate otherwise, you may remove your bandages 48 hours after surgery, and you may shower at that time. You may have steri-strips (small white skin tapes) in place directly over the incision.  These strips should be left on the skin for 7-10 days.  If your surgeon used Dermabond (skin glue) on the incision, you may shower in 24 hours.  The glue will flake off over the next 2-3 weeks.  If your port is left accessed at the end of surgery (needle left in port), the dressing cannot get wet and should only by changed by a healthcare professional. When the port is no longer accessed (when the needle has been removed), follow  step 7.   ACTIVITIES:  Limit activity involving your arms for the next 72 hours. Do no strenuous exercise or activity for 1 week. You may drive when you are no longer taking prescription pain medication, you can comfortably wear a seatbelt, and you can maneuver your car. 10.You may need to see your doctor in the office for a follow-up appointment.  Please       check with your doctor.  11.When you receive a new Port-a-Cath, you will get a product guide and        ID card.  Please keep them in case you need them.  WHEN TO CALL YOUR DOCTOR ([phone]): Fever over 101.0 Chills Continued bleeding from incision Increased redness and tenderness at the site Shortness of breath, difficulty breathing   The clinic staff is available to answer your questions during regular business hours. Please don't hesitate to call and ask to speak to one of the nurses or medical assistants for clinical concerns. If you have a medical emergency, go to the nearest emergency room or call 911.  A surgeon from [facility] is always on call at the hospital.     For further information, please visit [email or web address]      

## 2022-10-10 NOTE — H&P (Signed)
Krystal [name] is an 57 y.o. female.   Chief Complaint: stage IV breast cancer HPI: 41 yof with prior melanoma and breast cancer. She has stage IV breast cancer and is due to begin systemic therapy.  She has PET with nodal, pulmonary and hepatic mets with some bony mets and now with neurologic symptoms.  Due to begin enhertu tomorrow. She has prior right sided port placed.    Past Medical History:  Diagnosis Date   Arthritis    shoulders   BRCA1 positive    BRCA1 [id number]   Cancer ([place])    Chest wall mass [id number]   Complication of anesthesia    respiratory depression/arrest with Dilaudid   Cough with sputum 09/10/2015   clear sputum, per pt.   Dental crowns present    Diabetes mellitus without complication ([place])    type 2   Family history of Lynch syndrome    Family history of pancreatic cancer    GERD (gastroesophageal reflux disease)    takes OTC meds   Headache    History of breast cancer 2015   Stuffy and runny nose 09/10/2015   green drainage from nose, per pt.    Past Surgical History:  Procedure Laterality Date   ABDOMINAL HYSTERECTOMY     BREAST LUMPECTOMY Left 09/16/2015   Procedure: LEFT BREAST MASS EXCISION;  Surgeon: Rolm Bookbinder, MD;  Location: [place];  Service: General;  Laterality: Left;   BREAST RECONSTRUCTION WITH PLACEMENT OF TISSUE EXPANDER AND FLEX HD (ACELLULAR HYDRATED DERMIS) Bilateral 04/16/2014   Procedure: BILATERAL BREAST RECONSTRUCTION WITH PLACEMENT OF TISSUE EXPANDER AND FLEX HD (ACELLULAR HYDRATED DERMIS);  Surgeon: Irene Limbo, MD;  Location: [place];  Service: Plastics;  Laterality: Bilateral;   CESAREAN SECTION  1999; 2001; 2005   [place] AND CLOSURE WOUND Bilateral 05/15/2014   Procedure: DEBRIDEMENT AND CLOSURE OF BILATERAL MASECTOMY INCISIONS WITH BREAST EXPANSION;  Surgeon: Irene Limbo, MD;  Location: [place];  Service: Plastics;   Laterality: Bilateral;   LAPAROSCOPIC ASSISTED VAGINAL HYSTERECTOMY N/A 08/13/2014   Procedure: OPEN LAPAROSCOPY, EXPLORATORY LAPAROTOMY, TOTAL ABDOMINAL HYSTERECTOMY WITH BILATERAL SALPINGECTOMY AND BILATERAL OOPHORECTOMY;  Surgeon: Darlyn Chamber, MD;  Location: [place];  Service: Gynecology;  Laterality: N/A;   LIPOSUCTION WITH LIPOFILLING Bilateral 11/13/2014   Procedure: LIPOFILLING TO BILATERAL CHEST ;  Surgeon: Irene Limbo, MD;  Location: [place];  Service: Plastics;  Laterality: Bilateral;   MELANOMA EXCISION N/A 04/16/2014   Procedure: WIDE LOCAL EXCISION OF BACK MELANOMA;  Surgeon: Rolm Bookbinder, MD;  Location: [place];  Service: General;  Laterality: N/A;   PORT-A-CATH REMOVAL Right 06/14/2017   Procedure: REMOVAL PORT-A-CATH;  Surgeon: Rolm Bookbinder, MD;  Location: [place];  Service: General;  Laterality: Right;   PORTACATH PLACEMENT Right 10/21/2015   Procedure: INSERTION PORT-A-CATH WITH ULTRASOUND ;  Surgeon: Rolm Bookbinder, MD;  Location: [place];  Service: General;  Laterality: Right;   REMOVAL OF BILATERAL TISSUE EXPANDERS WITH PLACEMENT OF BILATERAL BREAST IMPLANTS Bilateral 11/13/2014   Procedure: REMOVAL OF BILATERAL TISSUE EXPANDERS WITH PLACEMENT OF BILATERAL SILICONE IMPLANTS ;  Surgeon: Irene Limbo, MD;  Location: [place];  Service: Plastics;  Laterality: Bilateral;   SIMPLE MASTECTOMY WITH AXILLARY SENTINEL NODE BIOPSY Bilateral 04/16/2014   Procedure: BILATERAL SKIN SPARING  MASTECTOMIES WITH LEFT AXILLARY SENTINEL NODE BIOPSY;  Surgeon: Rolm Bookbinder, MD;  Location: [facility];  Service: General;  Laterality: Bilateral;    Family History  Problem Relation Age of Onset   Other Father        COVID complications   Breast cancer Paternal Aunt        dx > 19   Pancreatic cancer Paternal Uncle 72       smoker; deceased   Heart  disease Maternal Grandmother    Heart attack Maternal Grandfather    Breast cancer Paternal Grandmother        dx 58s; ? if had 2nd BC in 35s; deceased 19s   Other Cousin        Lynch syndrome due to PMS2   BRCA 1/2 Cousin    Social History:  reports that she has been smoking cigarettes. She has never used smokeless tobacco. She reports current alcohol use. She reports that she does not use drugs.  Allergies:  Allergies  Allergen Reactions   Dilaudid [Hydromorphone] Other (See Comments)    RESPIRATORY DEPRESSION   Codeine Nausea And Vomiting    Medications Prior to Admission  Medication Sig Dispense Refill   aspirin EC 81 MG tablet Take 1 tablet (81 mg total) by mouth daily. Swallow whole. 90 tablet 3   aspirin-acetaminophen-caffeine (EXCEDRIN MIGRAINE) 250-250-65 MG tablet Take 2 tablets by mouth every 6 (six) hours as needed for headache.     CALCIUM PO Take 1,200 mg by mouth 2 (two) times daily.     esomeprazole (NEXIUM) 40 MG capsule Take 40 mg by mouth daily.     loperamide (IMODIUM) 2 MG capsule Take 2 mg by mouth daily as needed for diarrhea or loose stools. Take 2 tablets at first diarrhea and then 1 tablet after additional loose stool. Max number in 24 hours is 8 tablets.     loratadine (CLARITIN) 10 MG tablet Take 10 mg by mouth daily.     meloxicam (MOBIC) 15 MG tablet TAKE 1 TABLET(15 MG) BY MOUTH DAILY AS NEEDED FOR PAIN (Patient taking differently: Take 15 mg by mouth daily.) 30 tablet 1   rosuvastatin (CRESTOR) 10 MG tablet Take 1 tablet (10 mg total) by mouth daily. 90 tablet 3   traZODone (DESYREL) 50 MG tablet TAKE 1 TABLET(50 MG) BY MOUTH AT BEDTIME 30 tablet 11   venlafaxine XR (EFFEXOR-XR) 75 MG 24 hr capsule TAKE 1 CAPSULE(75 MG) BY MOUTH AT BEDTIME 90 capsule 3   dexamethasone (DECADRON) 4 MG tablet Take 1 tablets (4 mg) by mouth daily for 3 days after chemotherapy. Take with food. (Patient not taking: Reported on 10/09/2022) 30 tablet 1   lidocaine-prilocaine  (EMLA) cream Apply to affected area once (Patient not taking: Reported on 10/09/2022) 30 g 3   ondansetron (ZOFRAN) 8 MG tablet Take 1 tablet (8 mg total) by mouth every 8 (eight) hours as needed for nausea or vomiting. Start on the third day after chemotherapy. 30 tablet 1   prochlorperazine (COMPAZINE) 10 MG tablet Take 1 tablet (10 mg total) by mouth every 6 (six) hours as needed for nausea or vomiting. 30 tablet 1    Results for orders placed or performed during the hospital encounter of 10/10/22 (from the past 48 hour(s))  Glucose, capillary     Status: Abnormal   Collection Time: 10/10/22  1:35 PM  Result Value Ref Range   Glucose-Capillary 126 (H) 70 - 99 mg/dL    Comment: Glucose reference range applies only to samples taken after fasting for at least 8 hours.  No results found.  Review of Systems  Neurological:  Positive for numbness.  All other systems reviewed and are negative.   Blood pressure (!) 157/89, pulse 98, temperature 98.2 F (36.8 C), temperature source Oral, resp. rate 18, height '5\' 4"'$  (1.626 m), weight 102.5 kg, last menstrual period 07/06/2014, SpO2 97 %. Physical Exam Vitals reviewed.  Constitutional:      Appearance: Normal appearance.  Cardiovascular:     Rate and Rhythm: Normal rate.  Pulmonary:     Effort: Pulmonary effort is normal.  Neurological:     Mental Status: She is alert.      Assessment/Plan Stage IV breast cancer -plan for right ij port placement today and leave accessed for therapy tomorrow.   Rolm Bookbinder, MD 10/10/2022, 2:20 PM

## 2022-10-10 NOTE — Op Note (Signed)
Preoperative diagnosis: Stage IV breast cancer Postoperative diagnosis: Same as above Procedure: Right internal jugular port placement Surgeon: Dr. Serita Grammes Anesthesia: General Estimated blood loss: Minimal Complications: None Specimens: None Drains: None Sponge and count was correct at completion Disposition to recovery stable addition  Indications: This is a 57 year old female I know from treatment for melanoma as well as her breast cancer.  She has metastatic breast cancer and requires venous access for systemic therapy.  I discussed putting a right-sided internal jugular port in her.  Procedure: After informed consent was obtained she was taken to the operating room.  She was given antibiotics.  SCDs were placed.  She was placed under general anesthesia without complication.  She had a roll placed between her shoulder blades.  She was then prepped and draped in the standard sterile surgical fashion.  Surgical timeout was then performed.  I used the ultrasound to identify the internal jugular vein on the right side.  I accessed this easily with the needle.  The wire was passed.  I confirmed that the wire was in the correct position both by ultrasound as well as by fluoroscopy.  I then infiltrated Marcaine at the site of her old port site in her right chest.  I then reentered this incision.  I developed a pocket.  I then attempted to tunneled the line between the 2 sites.  This was very difficult due to the scar tissue I ended up using the device with the kit and attached the line and sutured it to that.  I then was able to pull the line between the 2 sites.  I then placed the dilator under fluoroscopy over the wire.  The wire assembly was then removed.  The line was placed through the sheath.  I pulled this back to be at the cavoatrial junction.  This was confirmed by fluoroscopy and the line is ready for use at this point.  I then attached the port.  I sutured this in the pocket with a 2-0  Prolene suture.  I then confirmed it aspirated blood and flushed easily.  I then closed the incisions with 3-0 Vicryl for Monocryl.  I put glue and Steri-Strips on them.  I then accessed the port with the access needle.  I confirmed that this was in the correct position by aspirating blood.  I then packed this with heparin.  A dressing was placed that she will begin chemotherapy tomorrow.  She tolerated this well and was transferred recovery stable.

## 2022-10-10 NOTE — Progress Notes (Signed)
Patient Care Team: Patient, [name] Krystal Lose, MD as Medical Oncologist (Hematology and Oncology) Krystal Pray, MD as Consulting Physician (Radiation Oncology) Krystal Bison Charlestine Massed, NP as Nurse Practitioner (Hematology and Oncology) Krystal Bookbinder, MD as Consulting Physician (General Surgery) Krystal [name], RPH-CPP as Pharmacist (Hematology and Oncology)  DIAGNOSIS: No diagnosis found.  SUMMARY OF ONCOLOGIC HISTORY: Oncology History  Cancer of central portion of left female breast ([place])  04/02/2014 Genetic Testing   BRCA1 [id number] pathogenic mutation identified on gene sequce and del/dup analyses of BRCA1 and BRCA2.  BRCA1 p.[id number] VUS identified as well.  The report date is 04/02/2014.  UPDATE: BRCA1 [id number] VUS has been reclassified as a likely benign variant.  The reclassification date is 04/21/2020.   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 N0 M0 stage IA: Oncotype DX 13 (ROR 9%)   04/16/2014 Surgery   Left upper back melanoma resected no residual melanoma identified on re-resection margins negative   05/26/2014 - 06/08/2015 Anti-estrogen oral therapy   Tamoxifen 20 mg daily with a plan to switch her to aromatase inhibitors once she gets oophorectomy ( patient did not continue antiestrogen therapy by choice)   08/13/2014 Surgery   oophorectomy with total abdominal hysterectomy   09/08/2015 -  Anti-estrogen oral therapy   Resumed anastrozole after she found recurrence of breast cancer; stopped in January 2018, started letrozole 2.5 mg daily 10/12/2016; switched to Exemestane 25 mg daily on 01/03/17   09/16/2015 Surgery   Skin, left: IDC grade 3; 0.5 cm, margins negative, ER 95%, PR 10%, HER-2 positive ratio 2.08   11/08/2015 - 02/2017 Chemotherapy    Herceptin every 3 weeks plus anastrozole   11/24/2015 - 01/14/2016 Radiation Therapy   Adjuvant radiation therapy by Dr. Sondra Come   01/2017 -  Anti-estrogen oral therapy   Exemestane daily (stopped others due to side effects), due to muscle aches and pains switch to tamoxifen 5 mg daily starting 12/18/2017   08/07/2019 Relapse/Recurrence   Bone scan: Sclerotic metastatic lesions involving left femoral neck, right ninth rib and entire L5 vertebra; MRI left hip: Diffuse signal abnormality L5 vertebral body with possible extraosseous extension   08/22/2019 - 09/03/2019 Radiation Therapy   Palliative radiation with Dr. Sondra Come to L5 and left proixmal femur.    10/2021 Treatment Plan Change   Fulvestrant and Verzenio   08/23/2022 Imaging   CT CAP 08/23/2022: New and enlarged pulmonary nodule consistent with progression.  Enlarged small low cervical and mediastinal nodes.  Multiple bone metastases minimally progressive.  Liver lesions evaluation is challenging because of hepatic steatosis with suspected liver progression, isolated tiny omental nodule     09/07/2022 PET scan   IMPRESSION: 1. Worsening of disease now with nodal, pulmonary and hepatic metastases. 2. Areas of variable FDG uptake with some new bone lesions and some areas of bony metastasis which are more focal, for instance in the pelvis where there was likely diffuse involvement on prior imaging. Many of the areas of sclerosis do not show substantial uptake as outlined above and some areas show improvement/resolution of FDG uptake seen previously but there are signs of active bony metastatic disease currently. 3. Likely LEFT adrenal adenoma. 4. Severe hepatic steatosis. 5. Aortic atherosclerosis.   Primary malignant neoplasm of breast with metastasis ([place])  08/25/2019 Initial Diagnosis   Primary  malignant neoplasm of breast with metastasis ([place])   10/11/2022 -  Chemotherapy   Patient is on Treatment Plan : BREAST METASTATIC  Fam-Trastuzumab Deruxtecan-nxki (Enhertu) (5.4) q21d       CHIEF COMPLIANT:   INTERVAL HISTORY: Krystal [name] is a   ALLERGIES:  is allergic to dilaudid [hydromorphone] and codeine.  MEDICATIONS:  No current facility-administered medications for this visit.   Current Outpatient Medications  Medication Sig Dispense Refill   traMADol (ULTRAM) 50 MG tablet Take 1 tablet (50 mg total) by mouth every 6 (six) hours as needed. 10 tablet 0   Facility-Administered Medications Ordered in Other Visits  Medication Dose Route Frequency Provider Last Rate Last Admin   acetaminophen (TYLENOL) 500 MG tablet            Chlorhexidine Gluconate Cloth 2 % PADS 6 each  6 each Topical Once Krystal Bookbinder, MD       And   Chlorhexidine Gluconate Cloth 2 % PADS 6 each  6 each Topical Once Krystal Bookbinder, MD       dexamethasone (DECADRON) injection   Intravenous Anesthesia Intra-op Reeves Dam, CRNA   4 mg at 10/10/22 1513   ePHEDrine sulfate (PF) '5mg'$ /mL syringe   Intravenous Anesthesia Intra-op Reeves Dam, CRNA   10 mg at 10/10/22 1509   [START ON 10/11/2022] feeding supplement (ENSURE PRE-SURGERY) liquid 296 mL  296 mL Oral Once Krystal Bookbinder, MD       fentaNYL (SUBLIMAZE) injection 25-50 mcg  25-50 mcg Intravenous Q5 min PRN Santa Lighter, MD       fentaNYL citrate (PF) (SUBLIMAZE) injection   Intravenous Anesthesia Intra-op Reeves Dam, CRNA   25 mcg at 10/10/22 1523   ketorolac (TORADOL) 15 MG/ML injection   Intravenous Anesthesia Intra-op Reeves Dam, CRNA   15 mg at 10/10/22 1543   lactated ringers infusion   Intravenous Continuous Oleta Mouse, MD 800 mL/hr at 10/10/22 1542 Rate Change at 10/10/22 1542   lidocaine 2% (20 mg/mL) 5 mL syringe   Intravenous Anesthesia Intra-op Reeves Dam, CRNA   80 mg at 10/10/22 1454   midazolam (VERSED) injection   Intravenous Anesthesia Intra-op Reeves Dam, CRNA   2 mg at 10/10/22 1451   ondansetron (ZOFRAN) injection    Intravenous Anesthesia Intra-op Reeves Dam, CRNA   4 mg at 10/10/22 1513   PHENYLephrine 80 mcg/ml in normal saline Adult IV Push Syringe (For Blood Pressure Support)   Intravenous Anesthesia Intra-op Reeves Dam, CRNA   80 mcg at 10/10/22 1528   promethazine (PHENERGAN) injection 6.25-12.5 mg  6.25-12.5 mg Intravenous Q15 min PRN Santa Lighter, MD       propofol (DIPRIVAN) 10 mg/mL bolus/IV push   Intravenous Anesthesia Intra-op Reeves Dam, CRNA   60 mg at 10/10/22 1521    PHYSICAL EXAMINATION: ECOG PERFORMANCE STATUS: {CHL ONC ECOG PS:[phone]}  There were no vitals filed for this visit. There were no vitals filed for this visit.  BREAST:*** No palpable masses or nodules in either right or left breasts. No palpable axillary supraclavicular or infraclavicular adenopathy no breast tenderness or nipple discharge. (exam performed in the presence of a chaperone)  LABORATORY DATA:  I have reviewed the data as listed    Latest Ref Rng & Units 09/14/2022    8:40 AM 08/23/2022    8:39 AM 05/11/2022    2:29 PM  CMP  Glucose 70 - 99 mg/dL 97   120   BUN  6 - 20 mg/dL 15   13   Creatinine 0.44 - 1.00 mg/dL 0.92  0.90  0.82   Sodium 135 - 145 mmol/L 141   139   Potassium 3.5 - 5.1 mmol/L 4.2   4.2   Chloride 98 - 111 mmol/L 103   102   CO2 22 - 32 mmol/L 32   31   Calcium 8.9 - 10.3 mg/dL 9.4   9.3   Total Protein 6.5 - 8.1 g/dL 6.6   7.5   Total Bilirubin 0.3 - 1.2 mg/dL 0.6   0.3   Alkaline Phos 38 - 126 U/L 67   63   AST 15 - 41 U/L 26   37   ALT 0 - 44 U/L 32   41     Lab Results  Component Value Date   WBC 2.7 (L) 09/14/2022   HGB 12.4 09/14/2022   HCT 36.3 09/14/2022   MCV 98.6 09/14/2022   PLT 198 09/14/2022   NEUTROABS 1.6 (L) 09/14/2022    ASSESSMENT & PLAN:  No problem-specific Assessment & Plan notes found for this encounter.    No orders of the defined types were placed in this encounter.  The patient has a good understanding of the overall plan. she  agrees with it. she will call with any problems that may develop before the next visit here. Total time spent: 30 mins including face to face time and time spent for planning, charting and co-ordination of care   Suzzette Righter, [place] 10/10/22    I Gardiner Coins am acting as a Education administrator for [employer]  ***

## 2022-10-10 NOTE — Transfer of Care (Signed)
Immediate Anesthesia Transfer of Care Note  Patient: Krystal [name]  Procedure(s) Performed: INSERTION PORT-A-CATH  Patient Location: PACU  Anesthesia Type:General  Level of Consciousness: awake, alert , and oriented  Airway & Oxygen Therapy: Patient Spontanous Breathing and Patient connected to face mask oxygen  Post-op Assessment: Report given to RN and Post -op Vital signs reviewed and stable  Post vital signs: Reviewed and stable  Last Vitals:  Vitals Value Taken Time  BP 134/77 10/10/22 1600  Temp 36.3 C 10/10/22 1556  Pulse 95 10/10/22 1602  Resp 9 10/10/22 1602  SpO2 100 % 10/10/22 1602  Vitals shown include unvalidated device data.  Last Pain:  Vitals:   10/10/22 1556  TempSrc:   PainSc: 0-No pain         Complications: No notable events documented.

## 2022-10-11 ENCOUNTER — Other Ambulatory Visit: Payer: Self-pay

## 2022-10-11 ENCOUNTER — Inpatient Hospital Stay: Payer: Commercial Managed Care - PPO | Admitting: Hematology and Oncology

## 2022-10-11 ENCOUNTER — Inpatient Hospital Stay: Payer: Commercial Managed Care - PPO

## 2022-10-11 ENCOUNTER — Other Ambulatory Visit: Payer: Commercial Managed Care - PPO

## 2022-10-11 ENCOUNTER — Encounter (HOSPITAL_COMMUNITY): Payer: Self-pay | Admitting: General Surgery

## 2022-10-11 VITALS — BP 109/64 | HR 83 | Temp 98.0°F | Resp 18

## 2022-10-11 VITALS — BP 122/76 | HR 80 | Temp 97.9°F | Resp 18 | Ht 64.0 in | Wt 226.2 lb

## 2022-10-11 DIAGNOSIS — C50112 Malignant neoplasm of central portion of left female breast: Secondary | ICD-10-CM | POA: Diagnosis not present

## 2022-10-11 DIAGNOSIS — C7951 Secondary malignant neoplasm of bone: Secondary | ICD-10-CM

## 2022-10-11 DIAGNOSIS — C50919 Malignant neoplasm of unspecified site of unspecified female breast: Secondary | ICD-10-CM | POA: Diagnosis not present

## 2022-10-11 DIAGNOSIS — Z170 Estrogen receptor positive status [ER+]: Secondary | ICD-10-CM | POA: Diagnosis not present

## 2022-10-11 DIAGNOSIS — Z95828 Presence of other vascular implants and grafts: Secondary | ICD-10-CM | POA: Insufficient documentation

## 2022-10-11 DIAGNOSIS — C7931 Secondary malignant neoplasm of brain: Secondary | ICD-10-CM | POA: Diagnosis not present

## 2022-10-11 LAB — CBC WITH DIFFERENTIAL (CANCER CENTER ONLY)
Abs Immature Granulocytes: 0.03 10*3/uL (ref 0.00–0.07)
Basophils Absolute: 0 10*3/uL (ref 0.0–0.1)
Basophils Relative: 0 %
Eosinophils Absolute: 0 10*3/uL (ref 0.0–0.5)
Eosinophils Relative: 0 %
HCT: 35.3 % — ABNORMAL LOW (ref 36.0–46.0)
Hemoglobin: 12.3 g/dL (ref 12.0–15.0)
Immature Granulocytes: 0 %
Lymphocytes Relative: 13 %
Lymphs Abs: 0.9 10*3/uL (ref 0.7–4.0)
MCH: 33.6 pg (ref 26.0–34.0)
MCHC: 34.8 g/dL (ref 30.0–36.0)
MCV: 96.4 fL (ref 80.0–100.0)
Monocytes Absolute: 0.6 10*3/uL (ref 0.1–1.0)
Monocytes Relative: 8 %
Neutro Abs: 5.4 10*3/uL (ref 1.7–7.7)
Neutrophils Relative %: 79 %
Platelet Count: 244 10*3/uL (ref 150–400)
RBC: 3.66 MIL/uL — ABNORMAL LOW (ref 3.87–5.11)
RDW: 11.9 % (ref 11.5–15.5)
WBC Count: 6.9 10*3/uL (ref 4.0–10.5)
nRBC: 0 % (ref 0.0–0.2)

## 2022-10-11 LAB — CMP (CANCER CENTER ONLY)
ALT: 32 U/L (ref 0–44)
AST: 22 U/L (ref 15–41)
Albumin: 4.2 g/dL (ref 3.5–5.0)
Alkaline Phosphatase: 84 U/L (ref 38–126)
Anion gap: 6 (ref 5–15)
BUN: 12 mg/dL (ref 6–20)
CO2: 32 mmol/L (ref 22–32)
Calcium: 9.1 mg/dL (ref 8.9–10.3)
Chloride: 102 mmol/L (ref 98–111)
Creatinine: 0.72 mg/dL (ref 0.44–1.00)
GFR, Estimated: >60 mL/min (ref >60)
Glucose, Bld: 104 mg/dL — ABNORMAL HIGH (ref 70–99)
Potassium: 4.1 mmol/L (ref 3.5–5.1)
Sodium: 140 mmol/L (ref 135–145)
Total Bilirubin: 0.4 mg/dL (ref 0.3–1.2)
Total Protein: 7.1 g/dL (ref 6.5–8.1)

## 2022-10-11 MED ORDER — DEXTROSE 5 % IV SOLN: Freq: Once | INTRAVENOUS | Status: AC

## 2022-10-11 MED ORDER — SODIUM CHLORIDE 0.9% FLUSH
10.0000 mL | Freq: Once | INTRAVENOUS | Status: AC
  Administered 2022-10-11: 10 mL

## 2022-10-11 MED ORDER — SODIUM CHLORIDE 0.9 % IV SOLN
150.0000 mg | Freq: Once | INTRAVENOUS | Status: AC
  Administered 2022-10-11: 150 mg via INTRAVENOUS
  Filled 2022-10-11: qty 150

## 2022-10-11 MED ORDER — DIPHENHYDRAMINE HCL 25 MG PO CAPS
25.0000 mg | ORAL_CAPSULE | Freq: Once | ORAL | Status: AC
  Administered 2022-10-11: 25 mg via ORAL
  Filled 2022-10-11: qty 1

## 2022-10-11 MED ORDER — SODIUM CHLORIDE 0.9% FLUSH
10.0000 mL | INTRAVENOUS | Status: DC | PRN
  Administered 2022-10-11: 10 mL

## 2022-10-11 MED ORDER — ACETAMINOPHEN 325 MG PO TABS
650.0000 mg | ORAL_TABLET | Freq: Once | ORAL | Status: AC
  Administered 2022-10-11: 650 mg via ORAL
  Filled 2022-10-11: qty 2

## 2022-10-11 MED ORDER — PALONOSETRON HCL INJECTION 0.25 MG/5ML
0.2500 mg | Freq: Once | INTRAVENOUS | Status: AC
  Administered 2022-10-11: 0.25 mg via INTRAVENOUS
  Filled 2022-10-11: qty 5

## 2022-10-11 MED ORDER — HEPARIN SOD (PORK) LOCK FLUSH 100 UNIT/ML IV SOLN
500.0000 [IU] | Freq: Once | INTRAVENOUS | Status: AC | PRN
  Administered 2022-10-11: 500 [IU]

## 2022-10-11 MED ORDER — FAM-TRASTUZUMAB DERUXTECAN-NXKI CHEMO 100 MG IV SOLR
5.4000 mg/kg | Freq: Once | INTRAVENOUS | Status: AC
  Administered 2022-10-11: 600 mg via INTRAVENOUS
  Filled 2022-10-11: qty 30

## 2022-10-11 MED ORDER — SODIUM CHLORIDE 0.9 % IV SOLN
10.0000 mg | Freq: Once | INTRAVENOUS | Status: AC
  Administered 2022-10-11: 10 mg via INTRAVENOUS
  Filled 2022-10-11: qty 10

## 2022-10-11 NOTE — Progress Notes (Signed)
Per Dr. Lindi Adie, Comfort to proceed with treatment without ECHO. Next ECHO scheduled for 10/16/2022.

## 2022-10-11 NOTE — Assessment & Plan Note (Signed)
08/22/2019: PET CT scan: Metastatic disease in the chest with multiple lung nodules all subcentimeter size and hypermetabolic lymph nodes (right paratracheal node 7 mm, subcarinal node 1 cm, right hilar node 6 mm), retroperitoneal and upper pelvic common iliac lymph nodes, bone metastases especially L5, L4, T6, right 10th rib, left proximal femur   Bone biopsy was not felt to be reliable in addition to the effect of radiation on the bone. Palliative radiation completed 09/03/2019 [facility]: BRCA1 mutation, TMB 5.36, MSI: Normal   Prior treatment: Ibrance with letrozole and Xgeva for bone metastases switched to abemaciclib and Fulvestrant   Bone metastasis: Patient does not want to take bisphosphonates CT CAP 08/23/2022: New and enlarged pulmonary nodule consistent with progression.  Enlarged small low cervical and mediastinal nodes.  Multiple bone metastases minimally progressive.  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  Skull metastases, abnormal enhancement left globe  ---------------------------------------------------------------- Current treatment:: Enhertu cycle 1 Labs reviewed, chemo consent obtained, chemo education completed Return to clinic in 3 weeks for cycle 2

## 2022-10-11 NOTE — Patient Instructions (Signed)
[facility]  Discharge Instructions: Thank you for choosing [facility] to provide your oncology and hematology care.   If you have a lab appointment with the Cancer Center, please go directly to the Cancer Center and check in at the registration area.   Wear comfortable clothing and clothing appropriate for easy access to any Portacath or PICC line.   We strive to give you quality time with your provider. You may need to reschedule your appointment if you arrive late (15 or more minutes).  Arriving late affects you and other patients whose appointments are after yours.  Also, if you miss three or more appointments without notifying the office, you may be dismissed from the clinic at the provider's discretion.      For prescription refill requests, have your pharmacy contact our office and allow 72 hours for refills to be completed.    Today you received the following chemotherapy and/or immunotherapy agents: Enhertu      To help prevent nausea and vomiting after your treatment, we encourage you to take your nausea medication as directed.  BELOW ARE SYMPTOMS THAT SHOULD BE REPORTED IMMEDIATELY: *FEVER GREATER THAN 100.4 F (38 C) OR HIGHER *CHILLS OR SWEATING *NAUSEA AND VOMITING THAT IS NOT CONTROLLED WITH YOUR NAUSEA MEDICATION *UNUSUAL SHORTNESS OF BREATH *UNUSUAL BRUISING OR BLEEDING *URINARY PROBLEMS (pain or burning when urinating, or frequent urination) *BOWEL PROBLEMS (unusual diarrhea, constipation, pain near the anus) TENDERNESS IN MOUTH AND THROAT WITH OR WITHOUT PRESENCE OF ULCERS (sore throat, sores in mouth, or a toothache) UNUSUAL RASH, SWELLING OR PAIN  UNUSUAL VAGINAL DISCHARGE OR ITCHING   Items with * indicate a potential emergency and should be followed up as soon as possible or go to the Emergency Department if any problems should occur.  Please show the CHEMOTHERAPY ALERT CARD or IMMUNOTHERAPY ALERT CARD at  check-in to the Emergency Department and triage nurse.  Should you have questions after your visit or need to cancel or reschedule your appointment, please contact [facility]  Dept: [phone]  and follow the prompts.  Office hours are 8:00 a.m. to 4:30 p.m. Monday - Friday. Please note that voicemails left after 4:00 p.m. may not be returned until the following business day.  We are closed weekends and major holidays. You have access to a nurse at all times for urgent questions. Please call the main number to the clinic Dept: [phone] and follow the prompts.   For any non-urgent questions, you may also contact your provider using MyChart. We now offer e-Visits for anyone 18 and older to request care online for non-urgent symptoms. For details visit [email or web address].   Also download the MyChart app! Go to the app store, search "MyChart", open the app, select , and log in with your MyChart username and password.  

## 2022-10-12 ENCOUNTER — Other Ambulatory Visit: Payer: Self-pay

## 2022-10-12 ENCOUNTER — Telehealth: Payer: Self-pay

## 2022-10-12 ENCOUNTER — Inpatient Hospital Stay: Payer: Commercial Managed Care - PPO | Admitting: Internal Medicine

## 2022-10-12 NOTE — Telephone Encounter (Signed)
-----   Message from Manning Charity, RN sent at 10/11/2022  1:57 PM EST ----- Regarding: First time Follow Up Enhertu Dr. Lindi Adie Pt 10/12/2022

## 2022-10-12 NOTE — Telephone Encounter (Signed)
Krystal [name] states that she is doing fine. She is eating, drinking, and urinating well.  She did not have a BM today so she too a stool softener. She knows to call the office at [phone] if she has any questions or concerns.

## 2022-10-16 ENCOUNTER — Ambulatory Visit (HOSPITAL_COMMUNITY): Payer: Commercial Managed Care - PPO

## 2022-10-16 ENCOUNTER — Inpatient Hospital Stay: Payer: Commercial Managed Care - PPO

## 2022-10-16 ENCOUNTER — Other Ambulatory Visit: Payer: Self-pay

## 2022-10-16 ENCOUNTER — Inpatient Hospital Stay (HOSPITAL_BASED_OUTPATIENT_CLINIC_OR_DEPARTMENT_OTHER): Payer: Commercial Managed Care - PPO | Admitting: Physician Assistant

## 2022-10-16 ENCOUNTER — Telehealth: Payer: Self-pay | Admitting: *Deleted

## 2022-10-16 ENCOUNTER — Other Ambulatory Visit (HOSPITAL_COMMUNITY): Payer: Self-pay

## 2022-10-16 VITALS — BP 128/88 | HR 89 | Temp 98.2°F | Resp 14

## 2022-10-16 VITALS — BP 119/83 | HR 93 | Resp 16

## 2022-10-16 DIAGNOSIS — R112 Nausea with vomiting, unspecified: Secondary | ICD-10-CM

## 2022-10-16 DIAGNOSIS — C7951 Secondary malignant neoplasm of bone: Secondary | ICD-10-CM | POA: Diagnosis not present

## 2022-10-16 DIAGNOSIS — C50919 Malignant neoplasm of unspecified site of unspecified female breast: Secondary | ICD-10-CM

## 2022-10-16 DIAGNOSIS — Z95828 Presence of other vascular implants and grafts: Secondary | ICD-10-CM

## 2022-10-16 DIAGNOSIS — C7931 Secondary malignant neoplasm of brain: Secondary | ICD-10-CM | POA: Diagnosis not present

## 2022-10-16 LAB — CBC WITH DIFFERENTIAL (CANCER CENTER ONLY)
Abs Immature Granulocytes: 0.03 10*3/uL (ref 0.00–0.07)
Basophils Absolute: 0 10*3/uL (ref 0.0–0.1)
Basophils Relative: 0 %
Eosinophils Absolute: 0.3 10*3/uL (ref 0.0–0.5)
Eosinophils Relative: 4 %
HCT: 38.5 % (ref 36.0–46.0)
Hemoglobin: 14.2 g/dL (ref 12.0–15.0)
Immature Granulocytes: 1 %
Lymphocytes Relative: 11 %
Lymphs Abs: 0.7 10*3/uL (ref 0.7–4.0)
MCH: 34.1 pg — ABNORMAL HIGH (ref 26.0–34.0)
MCHC: 36.9 g/dL — ABNORMAL HIGH (ref 30.0–36.0)
MCV: 92.5 fL (ref 80.0–100.0)
Monocytes Absolute: 0.1 10*3/uL (ref 0.1–1.0)
Monocytes Relative: 1 %
Neutro Abs: 5 10*3/uL (ref 1.7–7.7)
Neutrophils Relative %: 83 %
Platelet Count: 212 10*3/uL (ref 150–400)
RBC: 4.16 MIL/uL (ref 3.87–5.11)
RDW: 11.4 % — ABNORMAL LOW (ref 11.5–15.5)
Smear Review: NORMAL
WBC Count: 6.1 10*3/uL (ref 4.0–10.5)
nRBC: 0 % (ref 0.0–0.2)

## 2022-10-16 LAB — CMP (CANCER CENTER ONLY)
ALT: 57 U/L — ABNORMAL HIGH (ref 0–44)
AST: 39 U/L (ref 15–41)
Albumin: 4.1 g/dL (ref 3.5–5.0)
Alkaline Phosphatase: 114 U/L (ref 38–126)
Anion gap: 12 (ref 5–15)
BUN: 20 mg/dL (ref 6–20)
CO2: 25 mmol/L (ref 22–32)
Calcium: 9 mg/dL (ref 8.9–10.3)
Chloride: 99 mmol/L (ref 98–111)
Creatinine: 0.83 mg/dL (ref 0.44–1.00)
GFR, Estimated: >60 mL/min (ref >60)
Glucose, Bld: 132 mg/dL — ABNORMAL HIGH (ref 70–99)
Potassium: 3.7 mmol/L (ref 3.5–5.1)
Sodium: 136 mmol/L (ref 135–145)
Total Bilirubin: 0.8 mg/dL (ref 0.3–1.2)
Total Protein: 7.2 g/dL (ref 6.5–8.1)

## 2022-10-16 LAB — ABO/RH: ABO/RH(D): A POS

## 2022-10-16 LAB — SAMPLE TO BLOOD BANK

## 2022-10-16 LAB — MAGNESIUM: Magnesium: 1.9 mg/dL (ref 1.7–2.4)

## 2022-10-16 MED ORDER — PROMETHAZINE HCL 25 MG RE SUPP
25.0000 mg | Freq: Four times a day (QID) | RECTAL | 0 refills | Status: DC | PRN
  Filled 2022-10-16: qty 12, 3d supply, fill #0

## 2022-10-16 MED ORDER — SODIUM CHLORIDE 0.9% FLUSH
10.0000 mL | Freq: Once | INTRAVENOUS | Status: AC
  Administered 2022-10-16: 10 mL

## 2022-10-16 MED ORDER — SODIUM CHLORIDE 0.9 % IV SOLN: Freq: Once | INTRAVENOUS | Status: AC

## 2022-10-16 MED ORDER — SODIUM CHLORIDE 0.9 % IV SOLN
25.0000 mg | Freq: Once | INTRAVENOUS | Status: AC
  Administered 2022-10-16: 25 mg via INTRAVENOUS
  Filled 2022-10-16: qty 1

## 2022-10-16 MED ORDER — MORPHINE SULFATE (PF) 2 MG/ML IV SOLN
1.0000 mg | Freq: Once | INTRAVENOUS | Status: AC
  Administered 2022-10-16: 1 mg via INTRAVENOUS
  Filled 2022-10-16: qty 1

## 2022-10-16 MED ORDER — ONDANSETRON HCL 4 MG/2ML IJ SOLN
8.0000 mg | Freq: Once | INTRAMUSCULAR | Status: AC
  Administered 2022-10-16: 8 mg via INTRAVENOUS
  Filled 2022-10-16: qty 4

## 2022-10-16 MED ORDER — HEPARIN SOD (PORK) LOCK FLUSH 100 UNIT/ML IV SOLN
500.0000 [IU] | Freq: Once | INTRAVENOUS | Status: AC
  Administered 2022-10-16: 500 [IU]

## 2022-10-16 MED ORDER — FAMOTIDINE IN NACL 20-0.9 MG/50ML-% IV SOLN
20.0000 mg | Freq: Once | INTRAVENOUS | Status: AC
  Administered 2022-10-16: 20 mg via INTRAVENOUS
  Filled 2022-10-16: qty 50

## 2022-10-16 MED ORDER — SODIUM CHLORIDE 0.9 % IV SOLN: 8.0000 mg | Freq: Once | INTRAVENOUS | Status: DC

## 2022-10-16 NOTE — Telephone Encounter (Signed)
Received call from pt with complaint of severe fatigue, lightheadedness, decreased appetite and weight loss of 5 lbs over the weekend. Per MD pt needing lab and [facility] visit.  Appt scheduled, pt verbalized understanding of appt date and time.

## 2022-10-16 NOTE — Progress Notes (Signed)
Symptom Management Consult Note [place]    Patient Care Team: Patient, [name] [name], MD as Medical Oncologist (Hematology and Oncology) [name], MD as Consulting Physician (Radiation Oncology) Delice Bison, Charlestine Massed, NP as Nurse Practitioner (Hematology and Oncology) Rolm Bookbinder, MD as Consulting Physician (General Surgery) [name], RPH-CPP as Pharmacist (Hematology and Oncology)    Name / MRN / DOB: Krystal [name]  [id number]  1966-06-22   Date of visit: 10/16/2022   Chief Complaint/Reason for visit: nausea, vomiting, diarrhea, lightheadedness   Current Therapy: Enhertu  Last treatment:  Day 1   Cycle 1 on 10/11/22   ASSESSMENT & PLAN: Patient is a 57 y.o. female  with oncologic history of primary malignant neoplasm of breast with metastasis followed by Dr. Lindi Adie.  I have viewed most recent oncology note and lab work.    #Primary malignant neoplasm of breast with metastasis - Recently had first Enhertu treatment - Next appointment with oncologist is 10/31/22 - Discussed severity of symptoms grade 3 vomiting and grade 2 diarrhea with Dr. Lindi Adie who agrees with Enhertu dose reduction for future treatments.   #Symptom management: Nausea, vomiting, diarrhea, light hheaded -Patient looks to feel uncomfortable although is nontoxic and appearing.  She is actively vomiting on arrival to clinic. -CMP overall unremarkable. CBC with stable anemia -Patient received supportive care in clinic today with a liter of IV fluids, IV Zofran, Pepcid, Phenergan.  On reassessment she was able to tolerate ice chips and a cracker. She is reporting abdominal cramping and discomfort, 1 mg of IV morphine given. Abdominal exam with diffuse tenderness without peritoneal signs.  - Patient offered to RTC in x 2 days for IVF and antiemetics. She will cancel appointment if symptoms resolve. - Prescription sent to  pharmacy for phenergan suppositories if unable to tolerate PO antiemetics.   Strict ED precautions discussed should symptoms worsen.   Heme/Onc History: Oncology History  Cancer of central portion of left female breast ([place])  04/02/2014 Genetic Testing   BRCA1 [id number] pathogenic mutation identified on gene sequce and del/dup analyses of BRCA1 and BRCA2.  BRCA1 p.[id number] VUS identified as well.  The report date is 04/02/2014.  UPDATE: BRCA1 [id number] VUS has been reclassified as a likely benign variant.  The reclassification date is 04/21/2020.   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 N0 M0 stage IA: Oncotype DX 13 (ROR 9%)   04/16/2014 Surgery   Left upper back melanoma resected no residual melanoma identified on re-resection margins negative   05/26/2014 - 06/08/2015 Anti-estrogen oral therapy   Tamoxifen 20 mg daily with a plan to switch her to aromatase inhibitors once she gets oophorectomy ( patient did not continue antiestrogen therapy by choice)   08/13/2014 Surgery   oophorectomy with total abdominal hysterectomy   09/08/2015 -  Anti-estrogen oral therapy   Resumed anastrozole after she found recurrence of breast cancer; stopped in January 2018, started letrozole 2.5 mg daily 10/12/2016; switched to Exemestane 25 mg daily on 01/03/17   09/16/2015 Surgery   Skin, left: IDC grade 3; 0.5 cm, margins negative, ER 95%, PR 10%, HER-2 positive ratio 2.08   11/08/2015 - 02/2017 Chemotherapy   Herceptin every 3 weeks plus anastrozole   11/24/2015 - 01/14/2016 Radiation Therapy   Adjuvant radiation therapy by  Dr. Sondra Come   01/2017 -  Anti-estrogen oral therapy   Exemestane daily (stopped others due to side effects), due to muscle aches and pains switch to tamoxifen 5 mg daily starting 12/18/2017   08/07/2019  Relapse/Recurrence   Bone scan: Sclerotic metastatic lesions involving left femoral neck, right ninth rib and entire L5 vertebra; MRI left hip: Diffuse signal abnormality L5 vertebral body with possible extraosseous extension   08/22/2019 - 09/03/2019 Radiation Therapy   Palliative radiation with Dr. Sondra Come to L5 and left proixmal femur.    10/2021 Treatment Plan Change   Fulvestrant and Verzenio   08/23/2022 Imaging   CT CAP 08/23/2022: New and enlarged pulmonary nodule consistent with progression.  Enlarged small low cervical and mediastinal nodes.  Multiple bone metastases minimally progressive.  Liver lesions evaluation is challenging because of hepatic steatosis with suspected liver progression, isolated tiny omental nodule     09/07/2022 PET scan   IMPRESSION: 1. Worsening of disease now with nodal, pulmonary and hepatic metastases. 2. Areas of variable FDG uptake with some new bone lesions and some areas of bony metastasis which are more focal, for instance in the pelvis where there was likely diffuse involvement on prior imaging. Many of the areas of sclerosis do not show substantial uptake as outlined above and some areas show improvement/resolution of FDG uptake seen previously but there are signs of active bony metastatic disease currently. 3. Likely LEFT adrenal adenoma. 4. Severe hepatic steatosis. 5. Aortic atherosclerosis.   Primary malignant neoplasm of breast with metastasis ([place])  08/25/2019 Initial Diagnosis   Primary malignant neoplasm of breast with metastasis ([place])   10/11/2022 -  Chemotherapy   Patient is on Treatment Plan : BREAST METASTATIC Fam-Trastuzumab Deruxtecan-nxki (Enhertu) (5.4) q21d         Interval history-: Krystal [name] is a 57 y.o. female with oncologic history as above presenting to [facility] today with chief complaint of nausea, vomiting and diarrhea.  She is accompanied by her son who provides additional history.  Patient states this first dose of  Enhertu has been very hard on her.  She states 2 days ago she started to feel nauseous and was able to control it with Zofran and compazine until yesterday when she had 6 episodes of vomiting throughout the day.  She was unable to tolerate any p.o. intake including Zofran.  Patient has been taking sips of water and Gatorade trying to stay hydrated.  She has only had a bite of cracker to eat in the last 48 hours she tells me.  Patient does admit to feeling lightheaded when changing positions.  She has not experienced any falls.  She is also endorsing diarrhea with 6 episodes in the last 24 hours.  She describes stool as liquid and orange in color.  She denies any sick contacts.  She has had intermittent abdominal cramping which she thinks is because her stomach is so empty.  This has happened with nausea and vomiting in the past.  She has not had any over-the-counter medications prior to arrival today. She denies any fever or urinary symptoms.     ROS  All other systems are reviewed and are negative for acute change except as noted in the HPI.    Allergies  Allergen Reactions   Dilaudid [Hydromorphone] Other (See Comments)    RESPIRATORY DEPRESSION   Codeine Nausea And Vomiting     Past Medical History:  Diagnosis Date   Arthritis    shoulders   BRCA1 positive  BRCA1 [id number]   Cancer ([place])    Chest wall mass [id number]   Complication of anesthesia    respiratory depression/arrest with Dilaudid   Cough with sputum 09/10/2015   clear sputum, per pt.   Dental crowns present    Diabetes mellitus without complication ([place])    type 2   Family history of Lynch syndrome    Family history of pancreatic cancer    GERD (gastroesophageal reflux disease)    takes OTC meds   Headache    History of breast cancer 2015   Stuffy and runny nose 09/10/2015   green drainage from nose, per pt.     Past Surgical History:  Procedure Laterality Date   ABDOMINAL HYSTERECTOMY     BREAST  LUMPECTOMY Left 09/16/2015   Procedure: LEFT BREAST MASS EXCISION;  Surgeon: Rolm Bookbinder, MD;  Location: [place];  Service: General;  Laterality: Left;   BREAST RECONSTRUCTION WITH PLACEMENT OF TISSUE EXPANDER AND FLEX HD (ACELLULAR HYDRATED DERMIS) Bilateral 04/16/2014   Procedure: BILATERAL BREAST RECONSTRUCTION WITH PLACEMENT OF TISSUE EXPANDER AND FLEX HD (ACELLULAR HYDRATED DERMIS);  Surgeon: Irene Limbo, MD;  Location: [place];  Service: Plastics;  Laterality: Bilateral;   CESAREAN SECTION  1999; 2001; 2005   [place] AND CLOSURE WOUND Bilateral 05/15/2014   Procedure: DEBRIDEMENT AND CLOSURE OF BILATERAL MASECTOMY INCISIONS WITH BREAST EXPANSION;  Surgeon: Irene Limbo, MD;  Location: [place];  Service: Plastics;  Laterality: Bilateral;   LAPAROSCOPIC ASSISTED VAGINAL HYSTERECTOMY N/A 08/13/2014   Procedure: OPEN LAPAROSCOPY, EXPLORATORY LAPAROTOMY, TOTAL ABDOMINAL HYSTERECTOMY WITH BILATERAL SALPINGECTOMY AND BILATERAL OOPHORECTOMY;  Surgeon: Darlyn Chamber, MD;  Location: [place];  Service: Gynecology;  Laterality: N/A;   LIPOSUCTION WITH LIPOFILLING Bilateral 11/13/2014   Procedure: LIPOFILLING TO BILATERAL CHEST ;  Surgeon: Irene Limbo, MD;  Location: [place];  Service: Plastics;  Laterality: Bilateral;   MELANOMA EXCISION N/A 04/16/2014   Procedure: WIDE LOCAL EXCISION OF BACK MELANOMA;  Surgeon: Rolm Bookbinder, MD;  Location: [place];  Service: General;  Laterality: N/A;   PORT-A-CATH REMOVAL Right 06/14/2017   Procedure: REMOVAL PORT-A-CATH;  Surgeon: Rolm Bookbinder, MD;  Location: [place];  Service: General;  Laterality: Right;   PORTACATH PLACEMENT Right 10/21/2015   Procedure: INSERTION PORT-A-CATH WITH ULTRASOUND ;  Surgeon: Rolm Bookbinder, MD;  Location: [place];  Service:  General;  Laterality: Right;   PORTACATH PLACEMENT N/A 10/10/2022   Procedure: INSERTION PORT-A-CATH;  Surgeon: Rolm Bookbinder, MD;  Location: [place];  Service: General;  Laterality: N/A;   REMOVAL OF BILATERAL TISSUE EXPANDERS WITH PLACEMENT OF BILATERAL BREAST IMPLANTS Bilateral 11/13/2014   Procedure: REMOVAL OF BILATERAL TISSUE EXPANDERS WITH PLACEMENT OF BILATERAL SILICONE IMPLANTS ;  Surgeon: Irene Limbo, MD;  Location: [place];  Service: Plastics;  Laterality: Bilateral;   SIMPLE MASTECTOMY WITH AXILLARY SENTINEL NODE BIOPSY Bilateral 04/16/2014   Procedure: BILATERAL SKIN SPARING  MASTECTOMIES WITH LEFT AXILLARY SENTINEL NODE BIOPSY;  Surgeon: Rolm Bookbinder, MD;  Location: [place];  Service: General;  Laterality: Bilateral;    Social History   Socioeconomic History   Marital status: Widowed    Spouse name: Not on file   Number of children: 3   Years of education: Not on file   Highest education level: Not on file  Occupational History   Occupation: Web designer  Employer: [employer]  Tobacco Use   Smoking status: Some Days    Packs/day: 0.00    Years: 0.00    Total pack years: 0.00    Types: Cigarettes    Last attempt to quit: 04/17/2014    Years since quitting: 8.5   Smokeless tobacco: Never   Tobacco comments:    smokes 1-2 cigs every week  Vaping Use   Vaping Use: Never used  Substance and Sexual Activity   Alcohol use: Yes    Comment: occasionally   Drug use: No   Sexual activity: Not on file  Other Topics Concern   Not on file  Social History Narrative   Not on file   Social Determinants of Health   Financial Resource Strain: Not on file  Food Insecurity: Not on file  Transportation Needs: Not on file  Physical Activity: Not on file  Stress: Not on file  Social Connections: Not on file  Intimate Partner Violence: Not on file    Family History  Problem Relation Age of Onset    Other Father        COVID complications   Breast cancer Paternal Aunt        dx > 50   Pancreatic cancer Paternal Uncle 69       smoker; deceased   Heart disease Maternal Grandmother    Heart attack Maternal Grandfather    Breast cancer Paternal Grandmother        dx 10s; ? if had 2nd BC in 75s; deceased 50s   Other Cousin        Lynch syndrome due to PMS2   BRCA 1/2 Cousin      Current Outpatient Medications:    promethazine (PHENERGAN) 25 MG suppository, Place 1 suppository (25 mg total) rectally every 6 (six) hours as needed for nausea or vomiting., Disp: 12 each, Rfl: 0   aspirin EC 81 MG tablet, Take 1 tablet (81 mg total) by mouth daily. Swallow whole., Disp: 90 tablet, Rfl: 3   aspirin-acetaminophen-caffeine (EXCEDRIN MIGRAINE) 250-250-65 MG tablet, Take 2 tablets by mouth every 6 (six) hours as needed for headache., Disp: , Rfl:    CALCIUM PO, Take 1,200 mg by mouth 2 (two) times daily., Disp: , Rfl:    dexamethasone (DECADRON) 4 MG tablet, Take 1 tablets (4 mg) by mouth daily for 3 days after chemotherapy. Take with food., Disp: 30 tablet, Rfl: 1   esomeprazole (NEXIUM) 40 MG capsule, Take 40 mg by mouth daily., Disp: , Rfl:    lidocaine-prilocaine (EMLA) cream, Apply to affected area once, Disp: 30 g, Rfl: 3   loperamide (IMODIUM) 2 MG capsule, Take 2 mg by mouth daily as needed for diarrhea or loose stools. Take 2 tablets at first diarrhea and then 1 tablet after additional loose stool. Max number in 24 hours is 8 tablets., Disp: , Rfl:    loratadine (CLARITIN) 10 MG tablet, Take 10 mg by mouth daily., Disp: , Rfl:    meloxicam (MOBIC) 15 MG tablet, TAKE 1 TABLET(15 MG) BY MOUTH DAILY AS NEEDED FOR PAIN (Patient taking differently: Take 15 mg by mouth daily.), Disp: 30 tablet, Rfl: 1   ondansetron (ZOFRAN) 8 MG tablet, Take 1 tablet (8 mg total) by mouth every 8 (eight) hours as needed for nausea or vomiting. Start on the third day after chemotherapy., Disp: 30 tablet, Rfl: 1    prochlorperazine (COMPAZINE) 10 MG tablet, Take 1 tablet (10 mg total) by mouth every 6 (six)  hours as needed for nausea or vomiting., Disp: 30 tablet, Rfl: 1   rosuvastatin (CRESTOR) 10 MG tablet, Take 1 tablet (10 mg total) by mouth daily., Disp: 90 tablet, Rfl: 3   traMADol (ULTRAM) 50 MG tablet, Take 1 tablet (50 mg total) by mouth every 6 (six) hours as needed., Disp: 10 tablet, Rfl: 0   traZODone (DESYREL) 50 MG tablet, TAKE 1 TABLET(50 MG) BY MOUTH AT BEDTIME, Disp: 30 tablet, Rfl: 11   venlafaxine XR (EFFEXOR-XR) 75 MG 24 hr capsule, TAKE 1 CAPSULE(75 MG) BY MOUTH AT BEDTIME, Disp: 90 capsule, Rfl: 3  PHYSICAL EXAM: ECOG FS:1 - Symptomatic but completely ambulatory    Vitals:   10/16/22 1358  BP: 128/88  Pulse: 89  Resp: 14  Temp: 98.2 F (36.8 C)  TempSrc: Oral  SpO2: 98%   Physical Exam Vitals and nursing note reviewed.  Constitutional:      Appearance: She is well-developed. She is not ill-appearing or toxic-appearing.  HENT:     Head: Normocephalic.     Nose: Nose normal.     Mouth/Throat:     Mouth: Mucous membranes are dry.  Eyes:     Conjunctiva/sclera: Conjunctivae normal.     Comments: No nystagmus  Neck:     Vascular: No JVD.  Cardiovascular:     Rate and Rhythm: Normal rate and regular rhythm.     Pulses: Normal pulses.     Heart sounds: Normal heart sounds.  Pulmonary:     Effort: Pulmonary effort is normal.     Breath sounds: Normal breath sounds.  Chest:     Comments: Port a cath in right upper chest without signs of infection Abdominal:     General: There is no distension.     Tenderness: There is abdominal tenderness (diffuse).     Comments: Actively vomiting during triage.  No peritoneal signs  Musculoskeletal:        General: Normal range of motion.     Cervical back: Normal range of motion.  Skin:    General: Skin is warm and dry.  Neurological:     Mental Status: She is oriented to person, place, and time.     Comments: No focal  weakness. Clear speech, follows commands, no facial droop.        LABORATORY DATA: I have reviewed the data as listed    Latest Ref Rng & Units 10/16/2022    1:55 PM 10/11/2022   11:30 AM 09/14/2022    8:40 AM  CBC  WBC 4.0 - 10.5 K/uL 6.1  6.9  2.7   Hemoglobin 12.0 - 15.0 g/dL 14.2  12.3  12.4   Hematocrit 36.0 - 46.0 % 38.5  35.3  36.3   Platelets 150 - 400 K/uL 212  244  198         Latest Ref Rng & Units 10/16/2022    1:55 PM 10/11/2022   11:30 AM 09/14/2022    8:40 AM  CMP  Glucose 70 - 99 mg/dL 132  104  97   BUN 6 - 20 mg/dL '20  12  15   '$ Creatinine 0.44 - 1.00 mg/dL 0.83  0.72  0.92   Sodium 135 - 145 mmol/L 136  140  141   Potassium 3.5 - 5.1 mmol/L 3.7  4.1  4.2   Chloride 98 - 111 mmol/L 99  102  103   CO2 22 - 32 mmol/L 25  32  32   Calcium 8.9 - 10.3  mg/dL 9.0  9.1  9.4   Total Protein 6.5 - 8.1 g/dL 7.2  7.1  6.6   Total Bilirubin 0.3 - 1.2 mg/dL 0.8  0.4  0.6   Alkaline Phos 38 - 126 U/L 114  84  67   AST 15 - 41 U/L 39  22  26   ALT 0 - 44 U/L 57  32  32        RADIOGRAPHIC STUDIES (from last 24 hours if applicable) I have personally reviewed the radiological images as listed and agreed with the findings in the report. No results found.      Visit Diagnosis: 1. Nausea and vomiting, unspecified vomiting type   2. Malignant neoplasm metastatic to bone (HCC)      No orders of the defined types were placed in this encounter.   All questions were answered. The patient knows to call the clinic with any problems, questions or concerns. No barriers to learning was detected.  I have spent a total of 30 minutes minutes of face-to-face and non-face-to-face time, preparing to see the patient, obtaining and/or reviewing separately obtained history, performing a medically appropriate examination, counseling and educating the patient, ordering tests, documenting clinical information in the electronic health record, and care coordination (communications with  other health care professionals or caregivers).    Thank you for allowing me to participate in the care of this patient.    Barrie Folk, PA-C Department of Hematology/Oncology [facility] Phone: [phone]  [phone]    10/16/2022 4:37 PM

## 2022-10-17 ENCOUNTER — Encounter: Payer: Commercial Managed Care - PPO | Admitting: Physician Assistant

## 2022-10-17 ENCOUNTER — Other Ambulatory Visit: Payer: Self-pay | Admitting: Hematology and Oncology

## 2022-10-17 ENCOUNTER — Telehealth: Payer: Self-pay

## 2022-10-17 NOTE — Telephone Encounter (Signed)
RN called patient to check in on how she is feeling since yesterday's [facility] visit. No answer, voicemail left reminding patient of upcoming [facility] appointment at 1130 tomorrow and encouraging patient to call back if she has any complaints.

## 2022-10-18 ENCOUNTER — Inpatient Hospital Stay (HOSPITAL_BASED_OUTPATIENT_CLINIC_OR_DEPARTMENT_OTHER): Payer: Commercial Managed Care - PPO | Admitting: Physician Assistant

## 2022-10-18 ENCOUNTER — Inpatient Hospital Stay: Payer: Commercial Managed Care - PPO

## 2022-10-18 VITALS — BP 118/73 | HR 81 | Resp 16 | Wt 217.3 lb

## 2022-10-18 VITALS — BP 103/83 | HR 88 | Temp 98.6°F | Resp 16

## 2022-10-18 DIAGNOSIS — Z170 Estrogen receptor positive status [ER+]: Secondary | ICD-10-CM

## 2022-10-18 DIAGNOSIS — C50112 Malignant neoplasm of central portion of left female breast: Secondary | ICD-10-CM | POA: Diagnosis not present

## 2022-10-18 DIAGNOSIS — J101 Influenza due to other identified influenza virus with other respiratory manifestations: Secondary | ICD-10-CM | POA: Diagnosis not present

## 2022-10-18 DIAGNOSIS — R197 Diarrhea, unspecified: Secondary | ICD-10-CM

## 2022-10-18 DIAGNOSIS — C7951 Secondary malignant neoplasm of bone: Secondary | ICD-10-CM

## 2022-10-18 DIAGNOSIS — C50919 Malignant neoplasm of unspecified site of unspecified female breast: Secondary | ICD-10-CM

## 2022-10-18 DIAGNOSIS — C7931 Secondary malignant neoplasm of brain: Secondary | ICD-10-CM | POA: Diagnosis not present

## 2022-10-18 DIAGNOSIS — Z95828 Presence of other vascular implants and grafts: Secondary | ICD-10-CM

## 2022-10-18 DIAGNOSIS — R112 Nausea with vomiting, unspecified: Secondary | ICD-10-CM | POA: Diagnosis not present

## 2022-10-18 LAB — RESP PANEL BY RT-PCR (RSV, FLU A&B, COVID)  RVPGX2
Influenza A by PCR: NEGATIVE
Influenza B by PCR: POSITIVE — AB
Resp Syncytial Virus by PCR: NEGATIVE
SARS Coronavirus 2 by RT PCR: NEGATIVE

## 2022-10-18 LAB — CMP (CANCER CENTER ONLY)
ALT: 54 U/L — ABNORMAL HIGH (ref 0–44)
AST: 36 U/L (ref 15–41)
Albumin: 4.2 g/dL (ref 3.5–5.0)
Alkaline Phosphatase: 113 U/L (ref 38–126)
Anion gap: 12 (ref 5–15)
BUN: 16 mg/dL (ref 6–20)
CO2: 26 mmol/L (ref 22–32)
Calcium: 9.2 mg/dL (ref 8.9–10.3)
Chloride: 99 mmol/L (ref 98–111)
Creatinine: 0.95 mg/dL (ref 0.44–1.00)
GFR, Estimated: >60 mL/min (ref >60)
Glucose, Bld: 141 mg/dL — ABNORMAL HIGH (ref 70–99)
Potassium: 3.3 mmol/L — ABNORMAL LOW (ref 3.5–5.1)
Sodium: 137 mmol/L (ref 135–145)
Total Bilirubin: 0.7 mg/dL (ref 0.3–1.2)
Total Protein: 7.4 g/dL (ref 6.5–8.1)

## 2022-10-18 LAB — MAGNESIUM: Magnesium: 2 mg/dL (ref 1.7–2.4)

## 2022-10-18 LAB — CBC WITH DIFFERENTIAL (CANCER CENTER ONLY)
Abs Immature Granulocytes: 0.03 10*3/uL (ref 0.00–0.07)
Basophils Absolute: 0 10*3/uL (ref 0.0–0.1)
Basophils Relative: 0 %
Eosinophils Absolute: 0.4 10*3/uL (ref 0.0–0.5)
Eosinophils Relative: 6 %
HCT: 38 % (ref 36.0–46.0)
Hemoglobin: 13.9 g/dL (ref 12.0–15.0)
Immature Granulocytes: 1 %
Lymphocytes Relative: 17 %
Lymphs Abs: 1.1 10*3/uL (ref 0.7–4.0)
MCH: 33.8 pg (ref 26.0–34.0)
MCHC: 36.6 g/dL — ABNORMAL HIGH (ref 30.0–36.0)
MCV: 92.5 fL (ref 80.0–100.0)
Monocytes Absolute: 0.2 10*3/uL (ref 0.1–1.0)
Monocytes Relative: 3 %
Neutro Abs: 4.6 10*3/uL (ref 1.7–7.7)
Neutrophils Relative %: 73 %
Platelet Count: 143 10*3/uL — ABNORMAL LOW (ref 150–400)
RBC: 4.11 MIL/uL (ref 3.87–5.11)
RDW: 11.4 % — ABNORMAL LOW (ref 11.5–15.5)
WBC Count: 6.2 10*3/uL (ref 4.0–10.5)
nRBC: 0 % (ref 0.0–0.2)

## 2022-10-18 LAB — C DIFFICILE QUICK SCREEN W PCR REFLEX
C Diff antigen: NEGATIVE
C Diff interpretation: NOT DETECTED
C Diff toxin: NEGATIVE

## 2022-10-18 MED ORDER — ONDANSETRON HCL 4 MG/2ML IJ SOLN
8.0000 mg | Freq: Once | INTRAMUSCULAR | Status: AC
  Administered 2022-10-18: 8 mg via INTRAVENOUS
  Filled 2022-10-18: qty 4

## 2022-10-18 MED ORDER — SODIUM CHLORIDE 0.9 % IV SOLN: 8.0000 mg | Freq: Once | INTRAVENOUS | Status: DC

## 2022-10-18 MED ORDER — HEPARIN SOD (PORK) LOCK FLUSH 100 UNIT/ML IV SOLN
500.0000 [IU] | Freq: Once | INTRAVENOUS | Status: AC
  Administered 2022-10-18: 500 [IU]

## 2022-10-18 MED ORDER — OSELTAMIVIR PHOSPHATE 75 MG PO CAPS: 75.0000 mg | ORAL_CAPSULE | Freq: Two times a day (BID) | ORAL | 0 refills | Status: AC

## 2022-10-18 MED ORDER — DICYCLOMINE HCL 20 MG PO TABS: 20.0000 mg | ORAL_TABLET | Freq: Four times a day (QID) | ORAL | 0 refills | Status: DC | PRN

## 2022-10-18 MED ORDER — SODIUM CHLORIDE 0.9 % IV SOLN: Freq: Once | INTRAVENOUS | Status: AC

## 2022-10-18 MED ORDER — SODIUM CHLORIDE 0.9% FLUSH
10.0000 mL | Freq: Once | INTRAVENOUS | Status: AC
  Administered 2022-10-18: 10 mL

## 2022-10-18 MED ORDER — FAMOTIDINE IN NACL 20-0.9 MG/50ML-% IV SOLN
20.0000 mg | Freq: Once | INTRAVENOUS | Status: AC
  Administered 2022-10-18: 20 mg via INTRAVENOUS
  Filled 2022-10-18: qty 50

## 2022-10-18 NOTE — Progress Notes (Signed)
Symptom Management Consult Note [place]    Patient Care Team: Patient, [name] Krystal Lose, MD as Medical Oncologist (Hematology and Oncology) Krystal Pray, MD as Consulting Physician (Radiation Oncology) Krystal [name], Krystal Massed, NP as Nurse Practitioner (Hematology and Oncology) Krystal Bookbinder, MD as Consulting Physician (General Surgery) Krystal [name], RPH-CPP as Pharmacist (Hematology and Oncology)    Name / MRN / DOB: Krystal [name]  [id number]  1966-08-05   Date of visit: 10/18/2022   Chief Complaint/Reason for visit: diarrhea, generalized weakness   Current Therapy: Enhertu  Last treatment:  Day 1   Cycle 1 on 10/11/22   ASSESSMENT & PLAN: Patient is a 57 y.o. female  with oncologic history of  primary malignant neoplasm of breast with metastasis followed by Dr. Lindi Adie. I have viewed most recent oncology note and lab work.    #Primary malignant neoplasm of breast with metastasis - Next appointment with oncologist is 10/31/22   #Symptom management: Nausea, vomiting, diarrhea, Abdominal cramping -Improved since last visit however still present. Exam without abdominal tenderness, clinically appears dehydrated.  -Patient given 1L IVF, zofran and pepcid in clinic. On reassessment she is tolerating ginger ale.  -Patient had relief with morphine at last visit. Wanting medication for home use and is agreeable to try bentyl. Prescription sent to her pharmacy. -Patient tested positive for influenza B.  Will send prescription for Tamiflu as she is immunocompromise. -CBC overall unremarkable.  CMP with mild hypokalemia 3.3, she will try to increase dietary potassium and hold off on PO replacement at this time. -C diff testing is negative. Waiting on GI panel. -Oncologist updated on positive flu result.    Strict ED precautions discussed should symptoms worsen.     Heme/Onc History: Oncology History   Cancer of central portion of left female breast ([place])  04/02/2014 Genetic Testing   BRCA1 [id number] pathogenic mutation identified on gene sequce and del/dup analyses of BRCA1 and BRCA2.  BRCA1 p.[id number] VUS identified as well.  The report date is 04/02/2014.  UPDATE: BRCA1 [id number] VUS has been reclassified as a likely benign variant.  The reclassification date is 04/21/2020.   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 N0 M0 stage IA: Oncotype DX 13 (ROR 9%)   04/16/2014 Surgery   Left upper back melanoma resected no residual melanoma identified on re-resection margins negative   05/26/2014 - 06/08/2015 Anti-estrogen oral therapy   Tamoxifen 20 mg daily with a plan to switch her to aromatase inhibitors once she gets oophorectomy ( patient did not continue antiestrogen therapy by choice)   08/13/2014 Surgery   oophorectomy with total abdominal hysterectomy   09/08/2015 -  Anti-estrogen oral therapy   Resumed anastrozole after she found recurrence of breast cancer; stopped in January 2018, started letrozole 2.5 mg daily 10/12/2016; switched to Exemestane 25 mg daily on 01/03/17   09/16/2015 Surgery   Skin, left: IDC grade 3; 0.5 cm, margins negative, ER 95%, PR 10%, HER-2 positive ratio 2.08   11/08/2015 - 02/2017 Chemotherapy   Herceptin every 3 weeks plus anastrozole   11/24/2015 - 01/14/2016 Radiation Therapy   Adjuvant radiation therapy by Dr. Sondra Come   01/2017 -  Anti-estrogen oral therapy   Exemestane daily (stopped others due to side effects), due to muscle aches and pains switch to  tamoxifen 5 mg daily starting 12/18/2017   08/07/2019 Relapse/Recurrence   Bone scan: Sclerotic metastatic lesions involving left femoral neck, right ninth rib and entire L5 vertebra; MRI left hip: Diffuse signal abnormality L5 vertebral body  with possible extraosseous extension   08/22/2019 - 09/03/2019 Radiation Therapy   Palliative radiation with Dr. Sondra Come to L5 and left proixmal femur.    10/2021 Treatment Plan Change   Fulvestrant and Verzenio   08/23/2022 Imaging   CT CAP 08/23/2022: New and enlarged pulmonary nodule consistent with progression.  Enlarged small low cervical and mediastinal nodes.  Multiple bone metastases minimally progressive.  Liver lesions evaluation is challenging because of hepatic steatosis with suspected liver progression, isolated tiny omental nodule     09/07/2022 PET scan   IMPRESSION: 1. Worsening of disease now with nodal, pulmonary and hepatic metastases. 2. Areas of variable FDG uptake with some new bone lesions and some areas of bony metastasis which are more focal, for instance in the pelvis where there was likely diffuse involvement on prior imaging. Many of the areas of sclerosis do not show substantial uptake as outlined above and some areas show improvement/resolution of FDG uptake seen previously but there are signs of active bony metastatic disease currently. 3. Likely LEFT adrenal adenoma. 4. Severe hepatic steatosis. 5. Aortic atherosclerosis.   Primary malignant neoplasm of breast with metastasis ([place])  08/25/2019 Initial Diagnosis   Primary malignant neoplasm of breast with metastasis ([place])   10/11/2022 -  Chemotherapy   Patient is on Treatment Plan : BREAST METASTATIC Fam-Trastuzumab Deruxtecan-nxki (Enhertu) (5.4) q21d         Interval history-: Krystal [name] is a 57 y.o. female with oncologic history as above presenting to [facility] today with chief complaint of diarrhea, nausea, and vomiting. She presents unaccompanied to clinic today.  Seen in symptom management clinic x 2 days ago for nausea vomiting diarrhea.  She was returning today for IV fluids.  She informed the nurse that she was still feeling unwell.  She has had 4 episodes of diarrhea in the last 24 hours.  Diarrhea is  still liquid and yellow in color.  She has had persistent nausea without vomiting.  She is able to tolerate taking her pills by mouth.  She is only drank 20 ounces of fluid last 24 hours.  She tries to eat small snacks and bland food however gets diarrhea soon after eating.  She has not had any fever. No OTC medications taken. She does add that her significant other was diagnosed with Flu B x 2 days ago. She was last around him x3 days ago. Patient had the flu years ago and wonders if she has it again. She denies any arthralgias or myalgias.      ROS  All other systems are reviewed and are negative for acute change except as noted in the HPI.    Allergies  Allergen Reactions   Dilaudid [Hydromorphone] Other (See Comments)    RESPIRATORY DEPRESSION   Codeine Nausea And Vomiting     Past Medical History:  Diagnosis Date   Arthritis    shoulders   BRCA1 positive    BRCA1 [id number]   Cancer ([place])    Chest wall mass [id number]   Complication of anesthesia    respiratory depression/arrest with Dilaudid   Cough with sputum 09/10/2015   clear sputum, per pt.   Dental crowns present    Diabetes mellitus without complication ([place])    type 2   Family history of  Lynch syndrome    Family history of pancreatic cancer    GERD (gastroesophageal reflux disease)    takes OTC meds   Headache    History of breast cancer 2015   Stuffy and runny nose 09/10/2015   green drainage from nose, per pt.     Past Surgical History:  Procedure Laterality Date   ABDOMINAL HYSTERECTOMY     BREAST LUMPECTOMY Left 09/16/2015   Procedure: LEFT BREAST MASS EXCISION;  Surgeon: Krystal Bookbinder, MD;  Location: [place];  Service: General;  Laterality: Left;   BREAST RECONSTRUCTION WITH PLACEMENT OF TISSUE EXPANDER AND FLEX HD (ACELLULAR HYDRATED DERMIS) Bilateral 04/16/2014   Procedure: BILATERAL BREAST RECONSTRUCTION WITH PLACEMENT OF TISSUE EXPANDER AND FLEX HD (ACELLULAR HYDRATED  DERMIS);  Surgeon: Irene Limbo, MD;  Location: [place];  Service: Plastics;  Laterality: Bilateral;   CESAREAN SECTION  1999; 2001; 2005   [place] AND CLOSURE WOUND Bilateral 05/15/2014   Procedure: DEBRIDEMENT AND CLOSURE OF BILATERAL MASECTOMY INCISIONS WITH BREAST EXPANSION;  Surgeon: Irene Limbo, MD;  Location: [place];  Service: Plastics;  Laterality: Bilateral;   LAPAROSCOPIC ASSISTED VAGINAL HYSTERECTOMY N/A 08/13/2014   Procedure: OPEN LAPAROSCOPY, EXPLORATORY LAPAROTOMY, TOTAL ABDOMINAL HYSTERECTOMY WITH BILATERAL SALPINGECTOMY AND BILATERAL OOPHORECTOMY;  Surgeon: Darlyn Chamber, MD;  Location: [place];  Service: Gynecology;  Laterality: N/A;   LIPOSUCTION WITH LIPOFILLING Bilateral 11/13/2014   Procedure: LIPOFILLING TO BILATERAL CHEST ;  Surgeon: Irene Limbo, MD;  Location: [place];  Service: Plastics;  Laterality: Bilateral;   MELANOMA EXCISION N/A 04/16/2014   Procedure: WIDE LOCAL EXCISION OF BACK MELANOMA;  Surgeon: Krystal Bookbinder, MD;  Location: [place];  Service: General;  Laterality: N/A;   PORT-A-CATH REMOVAL Right 06/14/2017   Procedure: REMOVAL PORT-A-CATH;  Surgeon: Krystal Bookbinder, MD;  Location: [place];  Service: General;  Laterality: Right;   PORTACATH PLACEMENT Right 10/21/2015   Procedure: INSERTION PORT-A-CATH WITH ULTRASOUND ;  Surgeon: Krystal Bookbinder, MD;  Location: [name];  Service: General;  Laterality: Right;   PORTACATH PLACEMENT N/A 10/10/2022   Procedure: INSERTION PORT-A-CATH;  Surgeon: Krystal Bookbinder, MD;  Location: [place];  Service: General;  Laterality: N/A;   REMOVAL OF BILATERAL TISSUE EXPANDERS WITH PLACEMENT OF BILATERAL BREAST IMPLANTS Bilateral 11/13/2014   Procedure: REMOVAL OF BILATERAL TISSUE EXPANDERS WITH PLACEMENT OF BILATERAL SILICONE IMPLANTS ;  Surgeon:  Irene Limbo, MD;  Location: [place];  Service: Plastics;  Laterality: Bilateral;   SIMPLE MASTECTOMY WITH AXILLARY SENTINEL NODE BIOPSY Bilateral 04/16/2014   Procedure: BILATERAL SKIN SPARING  MASTECTOMIES WITH LEFT AXILLARY SENTINEL NODE BIOPSY;  Surgeon: Krystal Bookbinder, MD;  Location: [place];  Service: General;  Laterality: Bilateral;    Social History   Socioeconomic History   Marital status: Widowed    Spouse name: Not on file   Number of children: 3   Years of education: Not on file   Highest education level: Not on file  Occupational History   Occupation: Facilities manager: [employer]  Tobacco Use   Smoking status: Some Days    Packs/day: 0.00    Years: 0.00    Total pack years: 0.00    Types: Cigarettes    Last attempt to quit: 04/17/2014    Years since quitting: 8.5   Smokeless tobacco: Never   Tobacco comments:  smokes 1-2 cigs every week  Vaping Use   Vaping Use: Never used  Substance and Sexual Activity   Alcohol use: Yes    Comment: occasionally   Drug use: No   Sexual activity: Not on file  Other Topics Concern   Not on file  Social History Narrative   Not on file   Social Determinants of Health   Financial Resource Strain: Not on file  Food Insecurity: Not on file  Transportation Needs: Not on file  Physical Activity: Not on file  Stress: Not on file  Social Connections: Not on file  Intimate Partner Violence: Not on file    Family History  Problem Relation Age of Onset   Other Father        COVID complications   Breast cancer Paternal Aunt        dx > 50   Pancreatic cancer Paternal Uncle 21       smoker; deceased   Heart disease Maternal Grandmother    Heart attack Maternal Grandfather    Breast cancer Paternal Grandmother        dx 27s; ? if had 2nd BC in 9s; deceased 17s   Other Cousin        Lynch syndrome due to PMS2   BRCA 1/2 Cousin       Current Outpatient Medications:    dicyclomine (BENTYL) 20 MG tablet, Take 1 tablet (20 mg total) by mouth every 6 (six) hours as needed for up to 7 days for spasms., Disp: 28 tablet, Rfl: 0   oseltamivir (TAMIFLU) 75 MG capsule, Take 1 capsule (75 mg total) by mouth 2 (two) times daily for 7 days., Disp: 14 capsule, Rfl: 0   aspirin EC 81 MG tablet, Take 1 tablet (81 mg total) by mouth daily. Swallow whole., Disp: 90 tablet, Rfl: 3   aspirin-acetaminophen-caffeine (EXCEDRIN MIGRAINE) 250-250-65 MG tablet, Take 2 tablets by mouth every 6 (six) hours as needed for headache., Disp: , Rfl:    CALCIUM PO, Take 1,200 mg by mouth 2 (two) times daily., Disp: , Rfl:    dexamethasone (DECADRON) 4 MG tablet, Take 1 tablets (4 mg) by mouth daily for 3 days after chemotherapy. Take with food., Disp: 30 tablet, Rfl: 1   esomeprazole (NEXIUM) 40 MG capsule, Take 40 mg by mouth daily., Disp: , Rfl:    lidocaine-prilocaine (EMLA) cream, Apply to affected area once, Disp: 30 g, Rfl: 3   loperamide (IMODIUM) 2 MG capsule, Take 2 mg by mouth daily as needed for diarrhea or loose stools. Take 2 tablets at first diarrhea and then 1 tablet after additional loose stool. Max number in 24 hours is 8 tablets., Disp: , Rfl:    loratadine (CLARITIN) 10 MG tablet, Take 10 mg by mouth daily., Disp: , Rfl:    meloxicam (MOBIC) 15 MG tablet, TAKE 1 TABLET(15 MG) BY MOUTH DAILY AS NEEDED FOR PAIN (Patient taking differently: Take 15 mg by mouth daily.), Disp: 30 tablet, Rfl: 1   ondansetron (ZOFRAN) 8 MG tablet, Take 1 tablet (8 mg total) by mouth every 8 (eight) hours as needed for nausea or vomiting. Start on the third day after chemotherapy., Disp: 30 tablet, Rfl: 1   prochlorperazine (COMPAZINE) 10 MG tablet, Take 1 tablet (10 mg total) by mouth every 6 (six) hours as needed for nausea or vomiting., Disp: 30 tablet, Rfl: 1   promethazine (PHENERGAN) 25 MG suppository, Place 1 suppository (25 mg total) rectally every 6  (six) hours as  needed for nausea or vomiting., Disp: 12 each, Rfl: 0   rosuvastatin (CRESTOR) 10 MG tablet, Take 1 tablet (10 mg total) by mouth daily., Disp: 90 tablet, Rfl: 3   traMADol (ULTRAM) 50 MG tablet, Take 1 tablet (50 mg total) by mouth every 6 (six) hours as needed., Disp: 10 tablet, Rfl: 0   traZODone (DESYREL) 50 MG tablet, TAKE 1 TABLET(50 MG) BY MOUTH AT BEDTIME, Disp: 30 tablet, Rfl: 11   venlafaxine XR (EFFEXOR-XR) 75 MG 24 hr capsule, TAKE 1 CAPSULE(75 MG) BY MOUTH AT BEDTIME, Disp: 90 capsule, Rfl: 3  PHYSICAL EXAM: ECOG FS:1 - Symptomatic but completely ambulatory    Vitals:   10/18/22 1142  BP: 103/83  Pulse: 88  Resp: 16  Temp: 98.6 F (37 C)  TempSrc: Oral  SpO2: 98%   Physical Exam Vitals and nursing note reviewed.  Constitutional:      Appearance: She is well-developed. She is not ill-appearing or toxic-appearing.  HENT:     Head: Normocephalic.     Nose: Nose normal.     Mouth/Throat:     Mouth: Mucous membranes are dry.  Eyes:     Conjunctiva/sclera: Conjunctivae normal.  Neck:     Vascular: No JVD.  Cardiovascular:     Rate and Rhythm: Normal rate and regular rhythm.     Pulses: Normal pulses.     Heart sounds: Normal heart sounds.  Pulmonary:     Effort: Pulmonary effort is normal.     Breath sounds: Normal breath sounds.  Chest:     Comments: Port a cath in right upper chest without surrounding erythema.. Abdominal:     General: There is no distension.     Palpations: Abdomen is soft. There is no mass.     Tenderness: There is no abdominal tenderness. There is no right CVA tenderness, left CVA tenderness, guarding or rebound.  Musculoskeletal:     Cervical back: Normal range of motion.  Skin:    General: Skin is warm and dry.  Neurological:     Mental Status: She is oriented to person, place, and time.        LABORATORY DATA: I have reviewed the data as listed    Latest Ref Rng & Units 10/18/2022   11:53 AM 10/16/2022    1:55  PM 10/11/2022   11:30 AM  CBC  WBC 4.0 - 10.5 K/uL 6.2  6.1  6.9   Hemoglobin 12.0 - 15.0 g/dL 13.9  14.2  12.3   Hematocrit 36.0 - 46.0 % 38.0  38.5  35.3   Platelets 150 - 400 K/uL 143  212  244         Latest Ref Rng & Units 10/18/2022   11:53 AM 10/16/2022    1:55 PM 10/11/2022   11:30 AM  CMP  Glucose 70 - 99 mg/dL 141  132  104   BUN 6 - 20 mg/dL '16  20  12   '$ Creatinine 0.44 - 1.00 mg/dL 0.95  0.83  0.72   Sodium 135 - 145 mmol/L 137  136  140   Potassium 3.5 - 5.1 mmol/L 3.3  3.7  4.1   Chloride 98 - 111 mmol/L 99  99  102   CO2 22 - 32 mmol/L 26  25  32   Calcium 8.9 - 10.3 mg/dL 9.2  9.0  9.1   Total Protein 6.5 - 8.1 g/dL 7.4  7.2  7.1   Total Bilirubin 0.3 - 1.2 mg/dL 0.7  0.8  0.4   Alkaline Phos 38 - 126 U/L 113  114  84   AST 15 - 41 U/L 36  39  22   ALT 0 - 44 U/L 54  57  32        RADIOGRAPHIC STUDIES (from last 24 hours if applicable) I have personally reviewed the radiological images as listed and agreed with the findings in the report. No results found.      Visit Diagnosis: 1. Nausea and vomiting, unspecified vomiting type   2. Influenza B   3. Malignant neoplasm of central portion of left breast in female, estrogen receptor positive ([place])   4. Diarrhea, unspecified type      Orders Placed This Encounter  Procedures   Resp panel by RT-PCR (RSV, Flu A&B, Covid) Anterior Nasal Swab   GI pathogen panel by PCR, stool    Standing Status:   Future    Number of Occurrences:   1    Standing Expiration Date:   10/18/2023   C difficile quick screen w PCR reflex    Standing Status:   Future    Number of Occurrences:   1    Standing Expiration Date:   10/18/2023   CBC with Differential ([place] Only)    Standing Status:   Future    Number of Occurrences:   1    Standing Expiration Date:   10/18/2023   CMP ([place] only)    Standing Status:   Future    Number of Occurrences:   1    Standing Expiration Date:   10/18/2023   Magnesium     Standing Status:   Future    Number of Occurrences:   1    Standing Expiration Date:   10/18/2023    All questions were answered. The patient knows to call the clinic with any problems, questions or concerns. No barriers to learning was detected.  I have spent a total of 30 minutes minutes of face-to-face and non-face-to-face time, preparing to see the patient, obtaining and/or reviewing separately obtained history, performing a medically appropriate examination, counseling and educating the patient, ordering tests, documenting clinical information in the electronic health record, and care coordination (communications with other health care professionals or caregivers).    Thank you for allowing me to participate in the care of this patient.    Barrie Folk, PA-C Department of Hematology/Oncology [facility] at [facility] Phone: [phone]  [phone]    10/18/2022 2:16 PM

## 2022-10-20 LAB — GI PATHOGEN PANEL BY PCR, STOOL

## 2022-10-24 ENCOUNTER — Other Ambulatory Visit: Payer: Self-pay | Admitting: Radiation Therapy

## 2022-10-30 ENCOUNTER — Telehealth: Payer: Self-pay | Admitting: *Deleted

## 2022-10-30 ENCOUNTER — Other Ambulatory Visit (HOSPITAL_COMMUNITY): Payer: Commercial Managed Care - PPO

## 2022-10-30 MED FILL — Fosaprepitant Dimeglumine For IV Infusion 150 MG (Base Eq): INTRAVENOUS | Qty: 5 | Status: AC

## 2022-10-30 MED FILL — Dexamethasone Sodium Phosphate Inj 100 MG/10ML: INTRAMUSCULAR | Qty: 1 | Status: AC

## 2022-10-30 NOTE — Telephone Encounter (Signed)
Received call from pt requesting to push out chemo tx by one week.  Pt states she is still experiencing fatigue and overall not feeling well from previous treatment. RN encouraged pt that appt is still needed for lab and NP visit, pt may benefit from IVF fluids tomorrow.  Pt verbalized understanding and states she will come in for appt and discuss in further detail with NP.

## 2022-10-31 ENCOUNTER — Other Ambulatory Visit: Payer: Commercial Managed Care - PPO

## 2022-10-31 ENCOUNTER — Inpatient Hospital Stay: Payer: Commercial Managed Care - PPO

## 2022-10-31 ENCOUNTER — Other Ambulatory Visit: Payer: Self-pay

## 2022-10-31 ENCOUNTER — Encounter: Payer: Self-pay | Admitting: Adult Health

## 2022-10-31 ENCOUNTER — Inpatient Hospital Stay: Payer: Commercial Managed Care - PPO | Admitting: Adult Health

## 2022-10-31 DIAGNOSIS — C50919 Malignant neoplasm of unspecified site of unspecified female breast: Secondary | ICD-10-CM

## 2022-10-31 DIAGNOSIS — C7951 Secondary malignant neoplasm of bone: Secondary | ICD-10-CM

## 2022-10-31 DIAGNOSIS — I427 Cardiomyopathy due to drug and external agent: Secondary | ICD-10-CM

## 2022-10-31 DIAGNOSIS — C7931 Secondary malignant neoplasm of brain: Secondary | ICD-10-CM | POA: Diagnosis not present

## 2022-10-31 DIAGNOSIS — I517 Cardiomegaly: Secondary | ICD-10-CM

## 2022-10-31 DIAGNOSIS — Z95828 Presence of other vascular implants and grafts: Secondary | ICD-10-CM

## 2022-10-31 LAB — CBC WITH DIFFERENTIAL (CANCER CENTER ONLY)
Abs Immature Granulocytes: 0.01 10*3/uL (ref 0.00–0.07)
Basophils Absolute: 0 10*3/uL (ref 0.0–0.1)
Basophils Relative: 1 %
Eosinophils Absolute: 0.4 10*3/uL (ref 0.0–0.5)
Eosinophils Relative: 10 %
HCT: 31.5 % — ABNORMAL LOW (ref 36.0–46.0)
Hemoglobin: 11 g/dL — ABNORMAL LOW (ref 12.0–15.0)
Immature Granulocytes: 0 %
Lymphocytes Relative: 26 %
Lymphs Abs: 0.9 10*3/uL (ref 0.7–4.0)
MCH: 33.3 pg (ref 26.0–34.0)
MCHC: 34.9 g/dL (ref 30.0–36.0)
MCV: 95.5 fL (ref 80.0–100.0)
Monocytes Absolute: 0.4 10*3/uL (ref 0.1–1.0)
Monocytes Relative: 11 %
Neutro Abs: 1.8 10*3/uL (ref 1.7–7.7)
Neutrophils Relative %: 52 %
Platelet Count: 329 10*3/uL (ref 150–400)
RBC: 3.3 MIL/uL — ABNORMAL LOW (ref 3.87–5.11)
RDW: 12.8 % (ref 11.5–15.5)
WBC Count: 3.5 10*3/uL — ABNORMAL LOW (ref 4.0–10.5)
nRBC: 0 % (ref 0.0–0.2)

## 2022-10-31 LAB — CMP (CANCER CENTER ONLY)
ALT: 19 U/L (ref 0–44)
AST: 19 U/L (ref 15–41)
Albumin: 3.4 g/dL — ABNORMAL LOW (ref 3.5–5.0)
Alkaline Phosphatase: 104 U/L (ref 38–126)
Anion gap: 6 (ref 5–15)
BUN: 6 mg/dL (ref 6–20)
CO2: 33 mmol/L — ABNORMAL HIGH (ref 22–32)
Calcium: 8.5 mg/dL — ABNORMAL LOW (ref 8.9–10.3)
Chloride: 104 mmol/L (ref 98–111)
Creatinine: 0.62 mg/dL (ref 0.44–1.00)
GFR, Estimated: >60 mL/min (ref >60)
Glucose, Bld: 111 mg/dL — ABNORMAL HIGH (ref 70–99)
Potassium: 3.6 mmol/L (ref 3.5–5.1)
Sodium: 143 mmol/L (ref 135–145)
Total Bilirubin: 0.4 mg/dL (ref 0.3–1.2)
Total Protein: 5.9 g/dL — ABNORMAL LOW (ref 6.5–8.1)

## 2022-10-31 MED ORDER — SODIUM CHLORIDE 0.9% FLUSH
10.0000 mL | Freq: Once | INTRAVENOUS | Status: AC
  Administered 2022-10-31: 10 mL

## 2022-10-31 NOTE — Progress Notes (Signed)
[place] [facility] Follow up:    Patient, [name] No address on file   DIAGNOSIS:  Cancer Staging  Cancer of central portion of left female breast [facility]) Staging form: Breast, AJCC 7th Edition - Clinical: No stage assigned - Unsigned Laterality: Right - Pathologic: Stage IA (T1c, N0, cM0) - Signed by Rulon Eisenmenger, MD on 05/12/2014 Laterality: Right   SUMMARY OF ONCOLOGIC HISTORY: Oncology History  Cancer of central portion of left female breast ([place])  04/02/2014 Genetic Testing   BRCA1 [id number] pathogenic mutation identified on gene sequce and del/dup analyses of BRCA1 and BRCA2.  BRCA1 p.[id number] VUS identified as well.  The report date is 04/02/2014.  UPDATE: BRCA1 IA:[id number] VUS has been reclassified as a likely benign variant.  The reclassification date is 04/21/2020.   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 N0 M0 stage IA: Oncotype DX 13 (ROR 9%)   04/16/2014 Surgery   Left upper back melanoma resected no residual melanoma identified on re-resection margins negative   05/26/2014 - 06/08/2015 Anti-estrogen oral therapy   Tamoxifen 20 mg daily with a plan to switch her to aromatase inhibitors once she gets oophorectomy ( patient did not continue antiestrogen therapy by choice)   08/13/2014 Surgery   oophorectomy with total abdominal hysterectomy   09/08/2015 -  Anti-estrogen oral therapy   Resumed anastrozole after she found recurrence of breast cancer; stopped in January 2018, started letrozole 2.5 mg daily 10/12/2016; switched to Exemestane 25 mg daily on 01/03/17   09/16/2015 Surgery   Skin, left: IDC grade 3; 0.5 cm, margins negative, ER 95%, PR 10%, HER-2 positive ratio 2.08   11/08/2015 - 02/2017 Chemotherapy   Herceptin every 3 weeks plus anastrozole   11/24/2015 - 01/14/2016 Radiation  Therapy   Adjuvant radiation therapy by Dr. Sondra Come   01/2017 -  Anti-estrogen oral therapy   Exemestane daily (stopped others due to side effects), due to muscle aches and pains switch to tamoxifen 5 mg daily starting 12/18/2017   08/07/2019 Relapse/Recurrence   Bone scan: Sclerotic metastatic lesions involving left femoral neck, right ninth rib and entire L5 vertebra; MRI left hip: Diffuse signal abnormality L5 vertebral body with possible extraosseous extension   08/22/2019 - 09/03/2019 Radiation Therapy   Palliative radiation with Dr. Sondra Come to L5 and left proixmal femur.    10/2021 Treatment Plan Change   Fulvestrant and Verzenio   08/23/2022 Imaging   CT CAP 08/23/2022: New and enlarged pulmonary nodule consistent with progression.  Enlarged small low cervical and mediastinal nodes.  Multiple bone metastases minimally progressive.  Liver lesions evaluation is challenging because of hepatic steatosis with suspected liver progression, isolated tiny omental nodule     09/07/2022 PET scan   IMPRESSION: 1. Worsening of disease now with nodal, pulmonary and hepatic metastases. 2. Areas of variable FDG uptake with some new bone lesions and some areas of bony metastasis which are more focal, for instance in the pelvis where there was likely diffuse involvement on prior imaging. Many of the areas of sclerosis do not show substantial uptake as outlined above and some areas show improvement/resolution of FDG uptake seen previously but there are signs of active bony metastatic disease currently. 3. Likely LEFT adrenal adenoma. 4. Severe hepatic steatosis. 5. Aortic atherosclerosis.     CURRENT THERAPY: Enhertu  INTERVAL HISTORY: Krystal [name]  57 y.o. female returns for f/u of Enhertu.  This is set to be given at a reduced dose today.  She has not undergone repeat echo since starting Enhertu.  She was seen by Dr. Haroldine Laws due to cardiomegaly in setting of previous HER2 therapy in 2018.  He saw  her in February, ordered echo, however she canceled her appointments yesterday due to fatigue.    After her last cycle given on 3/9, she struggled with flu and was diagnosed with this on 3/13 and has struggled with this since then.  She continues to experience cough that leads to vomiting, creates a headache, and she has been experiencing persistent fatigue.  Her biggest complaint is the cough and she says cough drops are the only thing that has helped with this so far.  She has tried multiple OTC and prescription cough suppressants without relief.  This cough is chronic, unchanged, and began a couple months prior to starting the Enhertu.    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  MEDICAL HISTORY: Past Medical History:  Diagnosis Date   Arthritis    shoulders   BRCA1 positive    BRCA1 [id number]   Cancer ([place])    Chest wall mass [id number]   Complication of anesthesia    respiratory depression/arrest with Dilaudid   Cough with sputum 09/10/2015   clear sputum, per pt.   Dental crowns present    Diabetes mellitus  without complication ([place])    type 2   Family history of Lynch syndrome    Family history of pancreatic cancer    GERD (gastroesophageal reflux disease)    takes OTC meds   Headache    History of breast cancer 2015   Stuffy and runny nose 09/10/2015   green drainage from nose, per pt.    SURGICAL HISTORY: Past Surgical History:  Procedure Laterality Date   ABDOMINAL HYSTERECTOMY     BREAST LUMPECTOMY Left 09/16/2015   Procedure: LEFT BREAST MASS EXCISION;  Surgeon: Rolm Bookbinder, MD;  Location: [place];  Service: General;  Laterality: Left;   BREAST RECONSTRUCTION WITH PLACEMENT OF TISSUE EXPANDER AND FLEX HD (ACELLULAR HYDRATED DERMIS) Bilateral 04/16/2014   Procedure: BILATERAL BREAST RECONSTRUCTION WITH PLACEMENT OF TISSUE EXPANDER AND FLEX HD (ACELLULAR HYDRATED DERMIS);  Surgeon: Irene Limbo, MD;  Location: [place];  Service: Plastics;  Laterality: Bilateral;   CESAREAN SECTION  1999; 2001; 2005   [place] AND CLOSURE WOUND Bilateral 05/15/2014   Procedure: DEBRIDEMENT AND CLOSURE OF BILATERAL MASECTOMY INCISIONS WITH BREAST EXPANSION;  Surgeon: Irene Limbo, MD;  Location: [facility];  Service: Plastics;  Laterality: Bilateral;   LAPAROSCOPIC ASSISTED VAGINAL HYSTERECTOMY N/A 08/13/2014   Procedure: OPEN LAPAROSCOPY, EXPLORATORY LAPAROTOMY, TOTAL ABDOMINAL HYSTERECTOMY WITH BILATERAL SALPINGECTOMY AND BILATERAL OOPHORECTOMY;  Surgeon: Darlyn Chamber, MD;  Location: [place];  Service: Gynecology;  Laterality: N/A;   LIPOSUCTION WITH LIPOFILLING Bilateral 11/13/2014   Procedure: LIPOFILLING TO BILATERAL CHEST ;  Surgeon: Irene Limbo, MD;  Location: [place];  Service: Plastics;  Laterality: Bilateral;   MELANOMA EXCISION N/A 04/16/2014   Procedure: WIDE LOCAL EXCISION OF BACK MELANOMA;  Surgeon: Rolm Bookbinder, MD;  Location: [place];  Service: General;  Laterality: N/A;   PORT-A-CATH REMOVAL Right 06/14/2017   Procedure: REMOVAL PORT-A-CATH;  Surgeon: Rolm Bookbinder, MD;  Location: [place];  Service: General;  Laterality: Right;   PORTACATH PLACEMENT Right 10/21/2015   Procedure: INSERTION PORT-A-CATH WITH ULTRASOUND ;  Surgeon: Rolm Bookbinder, MD;  Location: [place];  Service: General;  Laterality: Right;   PORTACATH PLACEMENT N/A 10/10/2022   Procedure: INSERTION PORT-A-CATH;  Surgeon: Rolm Bookbinder, MD;  Location: [place];  Service: General;  Laterality: N/A;   REMOVAL OF BILATERAL TISSUE EXPANDERS WITH PLACEMENT OF BILATERAL BREAST IMPLANTS Bilateral 11/13/2014   Procedure: REMOVAL OF BILATERAL TISSUE EXPANDERS WITH PLACEMENT OF BILATERAL SILICONE IMPLANTS ;  Surgeon: Irene Limbo, MD;  Location: [place];  Service: Plastics;  Laterality: Bilateral;   SIMPLE MASTECTOMY WITH AXILLARY SENTINEL NODE BIOPSY Bilateral 04/16/2014   Procedure: BILATERAL SKIN SPARING  MASTECTOMIES WITH LEFT AXILLARY SENTINEL NODE BIOPSY;  Surgeon: Rolm Bookbinder, MD;  Location: [place];  Service: General;  Laterality: Bilateral;    SOCIAL HISTORY: Social History   Socioeconomic History   Marital status: Widowed    Spouse name: Not on file   Number of children: 3   Years of education: Not on file   Highest education level: Not on file  Occupational History   Occupation: Facilities manager: [employer]  Tobacco Use   Smoking status: Some Days    Packs/day: 0.00    Years: 0.00    Additional pack years: 0.00    Total pack years: 0.00    Types: Cigarettes    Last attempt to quit: 04/17/2014    Years since quitting: 8.5   Smokeless tobacco: Never   Tobacco comments:    smokes 1-2 cigs every week  Vaping Use   Vaping Use: Never used  Substance and Sexual Activity   Alcohol use: Yes    Comment: occasionally    Drug use: No   Sexual activity: Not on file  Other Topics Concern   Not on file  Social History Narrative   Not on file   Social Determinants of Health   Financial Resource Strain: Not on file  Food Insecurity: Not on file  Transportation Needs: Not on file  Physical Activity: Not on file  Stress: Not on file  Social Connections: Not on file  Intimate Partner Violence: Not on file    FAMILY HISTORY: Family History  Problem Relation Age of Onset   Other Father        COVID complications   Breast cancer Paternal Aunt        dx > 50   Pancreatic cancer Paternal Uncle 56       smoker; deceased   Heart disease Maternal Grandmother    Heart attack Maternal Grandfather    Breast  cancer Paternal Grandmother        dx 33s; ? if had 2nd BC in 62s; deceased 53s   Other Cousin        Lynch syndrome due to PMS2   BRCA 1/2 Cousin     Review of Systems  Constitutional:  Positive for fatigue. Negative for appetite change, chills, fever and unexpected weight change.  HENT:   Negative for hearing loss, lump/mass and trouble swallowing.   Eyes:  Negative for eye problems and icterus.  Respiratory:  Positive for cough. Negative for chest tightness and shortness of breath.   Cardiovascular:  Negative for chest pain, leg swelling and palpitations.  Gastrointestinal:  Negative for abdominal distention, abdominal pain, constipation, diarrhea, nausea and vomiting.  Endocrine: Negative for hot flashes.  Genitourinary:  Negative for difficulty urinating.   Musculoskeletal:  Negative for arthralgias.  Skin:  Negative for itching and rash.  Neurological:  Negative for dizziness, extremity weakness, headaches and numbness.  Hematological:  Negative for adenopathy. Does not bruise/bleed easily.  Psychiatric/Behavioral:  Negative for depression. The patient is not nervous/anxious.       PHYSICAL EXAMINATION  ECOG PERFORMANCE STATUS: 1 - Symptomatic but completely ambulatory  Vitals:    10/31/22 1138  BP: 126/72  Pulse: 88  Resp: 18  Temp: 98.6 F (37 C)  SpO2: 95%    Physical Exam Constitutional:      General: She is not in acute distress.    Appearance: Normal appearance. She is not toxic-appearing.  HENT:     Head: Normocephalic and atraumatic.  Eyes:     General: No scleral icterus. Cardiovascular:     Rate and Rhythm: Normal rate and regular rhythm.     Pulses: Normal pulses.     Heart sounds: Normal heart sounds.  Pulmonary:     Effort: Pulmonary effort is normal.     Breath sounds: Normal breath sounds.  Abdominal:     General: Abdomen is flat. Bowel sounds are normal. There is no distension.     Palpations: Abdomen is soft.     Tenderness: There is no abdominal tenderness.  Musculoskeletal:        General: No swelling.     Cervical back: Neck supple.  Lymphadenopathy:     Cervical: No cervical adenopathy.  Skin:    General: Skin is warm and dry.     Findings: No rash.  Neurological:     General: No focal deficit present.     Mental Status: She is alert.  Psychiatric:        Mood and Affect: Mood normal.        Behavior: Behavior normal.     LABORATORY DATA:  CBC    Component Value Date/Time   WBC 3.5 (L) 10/31/2022 1109   WBC 5.2 02/16/2017 1110   WBC 4.6 01/03/2016 0849   RBC 3.30 (L) 10/31/2022 1109   HGB 11.0 (L) 10/31/2022 1109   HGB 14.1 02/16/2017 1110   HCT 31.5 (L) 10/31/2022 1109   HCT 41.9 02/16/2017 1110   PLT 329 10/31/2022 1109   PLT 232 02/16/2017 1110   MCV 95.5 10/31/2022 1109   MCV 90.6 02/16/2017 1110   MCH 33.3 10/31/2022 1109   MCHC 34.9 10/31/2022 1109   RDW 12.8 10/31/2022 1109   RDW 13.6 02/16/2017 1110   LYMPHSABS 0.9 10/31/2022 1109   LYMPHSABS 1.2 02/16/2017 1110   MONOABS 0.4 10/31/2022 1109   MONOABS 0.3 02/16/2017 1110   EOSABS 0.4 10/31/2022 1109  EOSABS 0.3 02/16/2017 1110   BASOSABS 0.0 10/31/2022 1109   BASOSABS 0.0 02/16/2017 1110    CMP     Component Value Date/Time   NA 143  10/31/2022 1109   NA 142 02/16/2017 1110   K 3.6 10/31/2022 1109   K 4.3 02/16/2017 1110   CL 104 10/31/2022 1109   CO2 33 (H) 10/31/2022 1109   CO2 27 02/16/2017 1110   GLUCOSE 111 (H) 10/31/2022 1109   GLUCOSE 190 (H) 02/16/2017 1110   BUN 6 10/31/2022 1109   BUN 7.9 02/16/2017 1110   CREATININE 0.62 10/31/2022 1109   CREATININE 0.8 02/16/2017 1110   CALCIUM 8.5 (L) 10/31/2022 1109   CALCIUM 9.4 02/16/2017 1110   PROT 5.9 (L) 10/31/2022 1109   PROT 7.4 02/16/2017 1110   ALBUMIN 3.4 (L) 10/31/2022 1109   ALBUMIN 3.7 02/16/2017 1110   AST 19 10/31/2022 1109   AST 27 02/16/2017 1110   ALT 19 10/31/2022 1109   ALT 50 02/16/2017 1110   ALKPHOS 104 10/31/2022 1109   ALKPHOS 88 02/16/2017 1110   BILITOT 0.4 10/31/2022 1109   BILITOT 0.41 02/16/2017 1110   GFRNONAA >60 10/31/2022 1109   GFRAA >60 03/15/2020 0926        ASSESSMENT and THERAPY PLAN:   Krystal [name] is a 57 year old woman with metastatic breast cancer on treatment with Enhertu.  Here today for her second cycle.  We reviewed that this would be dose reduced.  Since she has not undergone an echocardiogram though I do not feel comfortable with proceeding with therapy today.  Her last echocardiogram occurred in 2018.  Reviewed with her in the setting of her persistent fatigue recent viral infection that was very significant along with her cardiomegaly and was in her best interest to wait until we could get the echocardiogram completed.  My nurse is looking into doing this today.  We will need to postpone her treatment until after her echocardiogram once we are able to arrange that date and time.  Scottie verbalized understanding of this and is in agreement.  I reviewed the above with Dr. Chryl Heck and she is also in agreement.    I will reach out to pulmonology to see if they have any recommendations for her persistent cough.    All questions were answered. The patient knows to call the clinic with any problems, questions or  concerns. We can certainly see the patient much sooner if necessary.  Total encounter time:30 minutes*in face-to-face visit time, chart review, lab review, care coordination, order entry, and documentation of the encounter time.    Wilber Bihari, NP 10/31/22 12:37 PM Medical Oncology and Hematology [facility] [place], [place] [address] Tel. [phone]    Fax. [phone]  *Total Encounter Time as defined by the Centers for Medicare and Medicaid Services includes, in addition to the face-to-face time of a patient visit (documented in the note above) non-face-to-face time: obtaining and reviewing outside history, ordering and reviewing medications, tests or procedures, care coordination (communications with other health care professionals or caregivers) and documentation in the medical record.

## 2022-11-01 ENCOUNTER — Encounter: Payer: Self-pay | Admitting: Adult Health

## 2022-11-06 ENCOUNTER — Telehealth: Payer: Self-pay

## 2022-11-06 ENCOUNTER — Inpatient Hospital Stay: Payer: Commercial Managed Care - PPO | Admitting: Internal Medicine

## 2022-11-06 ENCOUNTER — Ambulatory Visit
Admission: RE | Admit: 2022-11-06 | Discharge: 2022-11-06 | Disposition: A | Discharge status: Home / Self Care | Payer: Commercial Managed Care - PPO | Source: Ambulatory Visit | Attending: Internal Medicine | Admitting: Internal Medicine

## 2022-11-06 DIAGNOSIS — C50919 Malignant neoplasm of unspecified site of unspecified female breast: Secondary | ICD-10-CM

## 2022-11-06 MED ORDER — GADOPICLENOL 0.5 MMOL/ML IV SOLN
10.0000 mL | Freq: Once | INTRAVENOUS | Status: AC | PRN
  Administered 2022-11-06: 10 mL via INTRAVENOUS

## 2022-11-06 NOTE — Telephone Encounter (Signed)
Returned Pts call regarding wig rx. Pt asking for wig rx sent to [facility]. Order faxed to [phone] with receipt confirmation, Pt verbalized understanding.

## 2022-11-07 ENCOUNTER — Other Ambulatory Visit: Payer: Self-pay

## 2022-11-08 ENCOUNTER — Telehealth: Payer: Self-pay | Admitting: Licensed Clinical Social Worker

## 2022-11-08 ENCOUNTER — Telehealth: Payer: Self-pay | Admitting: Hematology and Oncology

## 2022-11-08 ENCOUNTER — Other Ambulatory Visit: Payer: Self-pay

## 2022-11-08 ENCOUNTER — Encounter: Payer: Self-pay | Admitting: Adult Health

## 2022-11-08 DIAGNOSIS — C7951 Secondary malignant neoplasm of bone: Secondary | ICD-10-CM

## 2022-11-08 NOTE — Telephone Encounter (Signed)
Patient had questions about her pathology report and the prognostic panel.  I discussed with her that the tumor was ER positive HER2 1+ (low).  Therefore Enhertu is still the right treatment for her.  She discussed whether Enhertu has CNS penetration and I discussed with her that there was people published in [employer] that described in the CNS penetration benefits of Enhertu.

## 2022-11-08 NOTE — Telephone Encounter (Signed)
[place] Work  CSW called pt regarding questions about social security disability. She has been working throughout treatment but is unsure now of her future work ability due to cancer spread. She does not have long-term disability through work.  CSW reviewed basics of social security disability and application process. Offered referral to [facility] to answer further questions and/or for assistance applying for SSI/SSDI.  Pt will think about it and call this CSW back after meeting with Dr. Mickeal Skinner tomorrow.   Melody Savidge E Jauan Wohl, LCSW

## 2022-11-09 ENCOUNTER — Other Ambulatory Visit: Payer: Self-pay | Admitting: Radiation Therapy

## 2022-11-09 ENCOUNTER — Inpatient Hospital Stay: Payer: Commercial Managed Care - PPO | Attending: Hematology and Oncology | Admitting: Internal Medicine

## 2022-11-09 ENCOUNTER — Telehealth: Payer: Self-pay | Admitting: Radiation Therapy

## 2022-11-09 ENCOUNTER — Other Ambulatory Visit: Payer: Self-pay

## 2022-11-09 VITALS — BP 123/79 | HR 86 | Temp 98.5°F | Resp 17 | Wt 222.4 lb

## 2022-11-09 DIAGNOSIS — Z923 Personal history of irradiation: Secondary | ICD-10-CM | POA: Insufficient documentation

## 2022-11-09 DIAGNOSIS — Z7952 Long term (current) use of systemic steroids: Secondary | ICD-10-CM | POA: Diagnosis not present

## 2022-11-09 DIAGNOSIS — Z170 Estrogen receptor positive status [ER+]: Secondary | ICD-10-CM | POA: Insufficient documentation

## 2022-11-09 DIAGNOSIS — C7949 Secondary malignant neoplasm of other parts of nervous system: Secondary | ICD-10-CM | POA: Insufficient documentation

## 2022-11-09 DIAGNOSIS — Z79899 Other long term (current) drug therapy: Secondary | ICD-10-CM | POA: Diagnosis not present

## 2022-11-09 DIAGNOSIS — Z803 Family history of malignant neoplasm of breast: Secondary | ICD-10-CM | POA: Insufficient documentation

## 2022-11-09 DIAGNOSIS — Z791 Long term (current) use of non-steroidal anti-inflammatories (NSAID): Secondary | ICD-10-CM | POA: Diagnosis not present

## 2022-11-09 DIAGNOSIS — K760 Fatty (change of) liver, not elsewhere classified: Secondary | ICD-10-CM | POA: Insufficient documentation

## 2022-11-09 DIAGNOSIS — Z1501 Genetic susceptibility to malignant neoplasm of breast: Secondary | ICD-10-CM | POA: Insufficient documentation

## 2022-11-09 DIAGNOSIS — K219 Gastro-esophageal reflux disease without esophagitis: Secondary | ICD-10-CM | POA: Diagnosis not present

## 2022-11-09 DIAGNOSIS — C50919 Malignant neoplasm of unspecified site of unspecified female breast: Secondary | ICD-10-CM

## 2022-11-09 DIAGNOSIS — C7931 Secondary malignant neoplasm of brain: Secondary | ICD-10-CM

## 2022-11-09 DIAGNOSIS — C50112 Malignant neoplasm of central portion of left female breast: Secondary | ICD-10-CM | POA: Diagnosis not present

## 2022-11-09 DIAGNOSIS — Z79810 Long term (current) use of selective estrogen receptor modulators (SERMs): Secondary | ICD-10-CM | POA: Insufficient documentation

## 2022-11-09 DIAGNOSIS — C7951 Secondary malignant neoplasm of bone: Secondary | ICD-10-CM | POA: Diagnosis not present

## 2022-11-09 DIAGNOSIS — I700 Atherosclerosis of aorta: Secondary | ICD-10-CM | POA: Diagnosis not present

## 2022-11-09 DIAGNOSIS — Z85820 Personal history of malignant melanoma of skin: Secondary | ICD-10-CM | POA: Insufficient documentation

## 2022-11-09 DIAGNOSIS — C787 Secondary malignant neoplasm of liver and intrahepatic bile duct: Secondary | ICD-10-CM | POA: Insufficient documentation

## 2022-11-09 DIAGNOSIS — Z9221 Personal history of antineoplastic chemotherapy: Secondary | ICD-10-CM | POA: Insufficient documentation

## 2022-11-09 DIAGNOSIS — E119 Type 2 diabetes mellitus without complications: Secondary | ICD-10-CM | POA: Insufficient documentation

## 2022-11-09 DIAGNOSIS — Z7982 Long term (current) use of aspirin: Secondary | ICD-10-CM | POA: Insufficient documentation

## 2022-11-09 DIAGNOSIS — F17210 Nicotine dependence, cigarettes, uncomplicated: Secondary | ICD-10-CM | POA: Insufficient documentation

## 2022-11-09 DIAGNOSIS — Z87891 Personal history of nicotine dependence: Secondary | ICD-10-CM | POA: Diagnosis not present

## 2022-11-09 NOTE — Progress Notes (Signed)
Location/Histology of Brain Tumor:  MRI of brain on 11-06-22 --IMPRESSION: Numerous new foci of faint enhancement throughout the brain, highly suspicious for metastatic disease. Nonocclusive thrombus in the right distal transverse and sigmoid sinuses, extending into the internal jugular vein. Redemonstrated subchoroidal enhancement in the left globe, suspicious for metastasis.  Patient presented with symptoms of:   Per Dr. Melina Copa note on 11-09-22 She presented with short history of left sided facial numbness; this prompted MRI brain through Dr. Pamelia Hoit. Breast cancer has been progressive, she is scheduled to initiate therapy with Enhertu later this week. She also describes migraine headaches with aura, this has been presents for at least 4-5 years. This might be "a little worse" in recent weeks. Otherwise is functionally independent with activities of daily living.   Past or anticipated interventions, if any, per neurosurgery: none in EMR at this time  Past or anticipated interventions, if any, per medical oncology:  Dr. Barbaraann Cao on 11-08-22 --Assessment/Plan Breast cancer metastasized to brain, unspecified laterality Lametria Bigalke presents with clinical and radiographic syndrome consistent with CNS metastases from breast primary.  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   Small overall burden of disease does lead to some uncertainty. We recommended proceeding with planned systemic therapy, Enhertu.   We will repeat brain MRI with 3T magnet and SRS protocol fine cuts for better characterization of CNS burden and tumor location.   We also discussed CSF analysis, spine MRI study.  We will defer each of these for now. Will defer steroids as well. We ask that Ream Alleyne return to clinic in 1 months following next brain MRI, or sooner as needed.  Dr. Pamelia Hoit  10/31/2022 Saw Mardella Layman Causey-NP    --ASSESSMENT and THERAPY PLAN:  Krystal [name] is a 57 year old woman with metastatic breast cancer on treatment with Enhertu.   Here today for her second cycle.   We reviewed that this would be dose reduced.   Since she has not undergone an echocardiogram though I do not feel comfortable with proceeding with therapy today.   Her last echocardiogram occurred in 2018.   Reviewed with her in the setting of her persistent fatigue recent viral infection that was very significant along with her cardiomegaly and was in her best interest to wait until we could get the echocardiogram completed.   My nurse is looking into doing this today. We will need to postpone her treatment until after her echocardiogram once we are able to arrange that date and time.  [place] verbalized understanding of this and is in agreement.   I reviewed the above with Dr. Al Pimple and she is also in agreement.   I will reach out to pulmonology to see if they have any recommendations for her persistent cough.  Dose of Decadron, if applicable: Only for a few days after she has her systemic infusion   Recent neurologic symptoms, if any:  Seizures: Denies Headaches: Reports occasional headaches that shift around in location Nausea: Denies (unless it's a smell she is unable to tolerate) Dizziness/ataxia: Reports feeling slightly off balance/dizzy when she first stands, but denies any falls Difficulty with hand coordination: Denies Focal numbness/weakness: Continues to experience some numbness to the left side of her face, but denies any trouble moving facial muscles Visual deficits/changes: Reports "wavy bright lights like in the 70s" to her left eye. States these occur more during the day while she's at work Confusion/Memory deficits: Reports occasionally having  difficulty finding her words  SAFETY ISSUES: Prior radiation? Yes 11/25/2015- 01/14/2016    Left chest wall treated to 45 Gy in 25 fractions  Scar boost treated to 18 Gy in 9  fractions  08/21/2019 through 09/03/2019 Site Technique Total Dose (Gy) Dose per Fx (Gy) Completed Fx Beam Energies  Femur Left: Ext_Lt 3D 30/30 3 10/10 10X, 15X  Lumbar Spine: L5 3D 30/30 3 10/10 10X, 15X    Pacemaker/ICD? No Possible current pregnancy? No--hysterectomy Is the patient on methotrexate? No  Additional Complaints / other details: Continues to work full time. Reports she's scheduled to see an ophthalmologist later today, but is thinking about canceling since she isn't interested in having surgery on orbital tumor

## 2022-11-09 NOTE — Progress Notes (Signed)
[facility] [place], [place] [address] [phone]   Interval Evaluation  Date of Service: 11/09/22 Patient Name: Krystal [name] Patient MRN: [id number] Patient DOB: 04/05/1966 Provider: Ventura Sellers, MD  Identifying Statement:  Krystal [name] is a 57 y.o. female with Breast cancer metastasized to brain, unspecified laterality   Primary Cancer:  Oncologic History: Oncology History  Cancer of central portion of left female breast  04/02/2014 Genetic Testing   BRCA1 [id number] pathogenic mutation identified on gene sequce and del/dup analyses of BRCA1 and BRCA2.  BRCA1 p.[id number] VUS identified as well.  The report date is 04/02/2014.  UPDATE: BRCA1 [id number] VUS has been reclassified as a likely benign variant.  The reclassification date is 04/21/2020.   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 N0 M0 stage IA: Oncotype DX 13 (ROR 9%)   04/16/2014 Surgery   Left upper back melanoma resected no residual melanoma identified on re-resection margins negative   05/26/2014 - 06/08/2015 Anti-estrogen oral therapy   Tamoxifen 20 mg daily with a plan to switch her to aromatase inhibitors once she gets oophorectomy ( patient did not continue antiestrogen therapy by choice)   08/13/2014 Surgery   oophorectomy with total abdominal hysterectomy   09/08/2015 -  Anti-estrogen oral therapy   Resumed anastrozole after she found recurrence of breast cancer; stopped in January 2018, started letrozole 2.5 mg daily 10/12/2016; switched to Exemestane 25 mg daily on 01/03/17   09/16/2015 Surgery   Skin, left: IDC grade 3; 0.5 cm, margins negative, ER 95%, PR 10%, HER-2 positive ratio 2.08   11/08/2015 - 02/2017 Chemotherapy   Herceptin every 3 weeks plus anastrozole   11/24/2015 -  01/14/2016 Radiation Therapy   Adjuvant radiation therapy by Dr. Sondra Come   01/2017 -  Anti-estrogen oral therapy   Exemestane daily (stopped others due to side effects), due to muscle aches and pains switch to tamoxifen 5 mg daily starting 12/18/2017   08/07/2019 Relapse/Recurrence   Bone scan: Sclerotic metastatic lesions involving left femoral neck, right ninth rib and entire L5 vertebra; MRI left hip: Diffuse signal abnormality L5 vertebral body with possible extraosseous extension   08/22/2019 - 09/03/2019 Radiation Therapy   Palliative radiation with Dr. Sondra Come to L5 and left proixmal femur.    10/2021 Treatment Plan Change   Fulvestrant and Verzenio   08/23/2022 Imaging   CT CAP 08/23/2022: New and enlarged pulmonary nodule consistent with progression.  Enlarged small low cervical and mediastinal nodes.  Multiple bone metastases minimally progressive.  Liver lesions evaluation is challenging because of hepatic steatosis with suspected liver progression, isolated tiny omental nodule     09/07/2022 PET scan   IMPRESSION: 1. Worsening of disease now with nodal, pulmonary and hepatic metastases. 2. Areas of variable FDG uptake with some new bone lesions and some areas of bony metastasis which are more focal, for instance in the pelvis where there was likely diffuse involvement on prior imaging. Many of the areas of sclerosis do not show substantial uptake as outlined above and some areas show improvement/resolution of FDG uptake seen previously but there are signs of active bony metastatic disease currently. 3. Likely LEFT adrenal adenoma. 4. Severe hepatic steatosis. 5. Aortic atherosclerosis.   Primary malignant neoplasm of breast with metastasis  08/25/2019 Initial Diagnosis   Primary malignant neoplasm  of breast with metastasis ([place])   10/11/2022 -  Chemotherapy   Patient is on Treatment Plan : BREAST METASTATIC Fam-Trastuzumab Deruxtecan-nxki (Enhertu) (5.4) q21d       Interval  History: Krystal [name] presents today for follow up after recent MRI brain.  No significant new or progressive changes.  She continues to report "wavy" visual symptoms in her left eye.  Still has the same left lower facial numbness.    H+P (10/09/22) Patient presents to review recent abnormal brain MRI.  She presented with short history of left sided facial numbness; this prompted MRI brain through Dr. Lindi Adie.  Breast cancer has been progressive, she is scheduled to initiate therapy with Enhertu later this week.  She also describes migraine headaches with aura, this has been presents for at least 4-5 years.  This might be "a little worse" in recent weeks.  Otherwise is functionally independent with activities of daily living.  Medications: Current Outpatient Medications on File Prior to Visit  Medication Sig Dispense Refill   aspirin EC 81 MG tablet Take 1 tablet (81 mg total) by mouth daily. Swallow whole. 90 tablet 3   aspirin-acetaminophen-caffeine (EXCEDRIN MIGRAINE) 250-250-65 MG tablet Take 2 tablets by mouth every 6 (six) hours as needed for headache.     CALCIUM PO Take 1,200 mg by mouth 2 (two) times daily.     dexamethasone (DECADRON) 4 MG tablet Take 1 tablets (4 mg) by mouth daily for 3 days after chemotherapy. Take with food. 30 tablet 1   dicyclomine (BENTYL) 20 MG tablet Take 1 tablet (20 mg total) by mouth every 6 (six) hours as needed for up to 7 days for spasms. (Patient not taking: Reported on 10/31/2022) 28 tablet 0   esomeprazole (NEXIUM) 40 MG capsule Take 40 mg by mouth daily.     lidocaine-prilocaine (EMLA) cream Apply to affected area once 30 g 3   loperamide (IMODIUM) 2 MG capsule Take 2 mg by mouth daily as needed for diarrhea or loose stools. Take 2 tablets at first diarrhea and then 1 tablet after additional loose stool. Max number in 24 hours is 8 tablets.     loratadine (CLARITIN) 10 MG tablet Take 10 mg by mouth daily.     meloxicam (MOBIC) 15 MG tablet TAKE 1 TABLET(15  MG) BY MOUTH DAILY AS NEEDED FOR PAIN (Patient taking differently: Take 15 mg by mouth daily.) 30 tablet 1   ondansetron (ZOFRAN) 8 MG tablet Take 1 tablet (8 mg total) by mouth every 8 (eight) hours as needed for nausea or vomiting. Start on the third day after chemotherapy. 30 tablet 1   prochlorperazine (COMPAZINE) 10 MG tablet Take 1 tablet (10 mg total) by mouth every 6 (six) hours as needed for nausea or vomiting. 30 tablet 1   promethazine (PHENERGAN) 25 MG suppository Place 1 suppository (25 mg total) rectally every 6 (six) hours as needed for nausea or vomiting. 12 each 0   rosuvastatin (CRESTOR) 10 MG tablet Take 1 tablet (10 mg total) by mouth daily. 90 tablet 3   traMADol (ULTRAM) 50 MG tablet Take 1 tablet (50 mg total) by mouth every 6 (six) hours as needed. 10 tablet 0   traZODone (DESYREL) 50 MG tablet TAKE 1 TABLET(50 MG) BY MOUTH AT BEDTIME 30 tablet 11   venlafaxine XR (EFFEXOR-XR) 75 MG 24 hr capsule TAKE 1 CAPSULE(75 MG) BY MOUTH AT BEDTIME 90 capsule 3   No current facility-administered medications on file prior to visit.  Allergies:  Allergies  Allergen Reactions   Dilaudid [Hydromorphone] Other (See Comments)    RESPIRATORY DEPRESSION   Codeine Nausea And Vomiting   Past Medical History:  Past Medical History:  Diagnosis Date   Arthritis    shoulders   BRCA1 positive    BRCA1 [id number]   Cancer ([place])    Chest wall mass [id number]   Complication of anesthesia    respiratory depression/arrest with Dilaudid   Cough with sputum 09/10/2015   clear sputum, per pt.   Dental crowns present    Diabetes mellitus without complication ([place])    type 2   Family history of Lynch syndrome    Family history of pancreatic cancer    GERD (gastroesophageal reflux disease)    takes OTC meds   Headache    History of breast cancer 2015   Stuffy and runny nose 09/10/2015   green drainage from nose, per pt.   Past Surgical History:  Past Surgical History:  Procedure  Laterality Date   ABDOMINAL HYSTERECTOMY     BREAST LUMPECTOMY Left 09/16/2015   Procedure: LEFT BREAST MASS EXCISION;  Surgeon: Rolm Bookbinder, MD;  Location: [facility];  Service: General;  Laterality: Left;   BREAST RECONSTRUCTION WITH PLACEMENT OF TISSUE EXPANDER AND FLEX HD (ACELLULAR HYDRATED DERMIS) Bilateral 04/16/2014   Procedure: BILATERAL BREAST RECONSTRUCTION WITH PLACEMENT OF TISSUE EXPANDER AND FLEX HD (ACELLULAR HYDRATED DERMIS);  Surgeon: Irene Limbo, MD;  Location: [facility];  Service: Plastics;  Laterality: Bilateral;   CESAREAN SECTION  1999; 2001; 2005   [place] AND CLOSURE WOUND Bilateral 05/15/2014   Procedure: DEBRIDEMENT AND CLOSURE OF BILATERAL MASECTOMY INCISIONS WITH BREAST EXPANSION;  Surgeon: Irene Limbo, MD;  Location: [facility];  Service: Plastics;  Laterality: Bilateral;   LAPAROSCOPIC ASSISTED VAGINAL HYSTERECTOMY N/A 08/13/2014   Procedure: OPEN LAPAROSCOPY, EXPLORATORY LAPAROTOMY, TOTAL ABDOMINAL HYSTERECTOMY WITH BILATERAL SALPINGECTOMY AND BILATERAL OOPHORECTOMY;  Surgeon: Darlyn Chamber, MD;  Location: [facility];  Service: Gynecology;  Laterality: N/A;   LIPOSUCTION WITH LIPOFILLING Bilateral 11/13/2014   Procedure: LIPOFILLING TO BILATERAL CHEST ;  Surgeon: Irene Limbo, MD;  Location: [facility];  Service: Plastics;  Laterality: Bilateral;   MELANOMA EXCISION N/A 04/16/2014   Procedure: WIDE LOCAL EXCISION OF BACK MELANOMA;  Surgeon: Rolm Bookbinder, MD;  Location: [facility];  Service: General;  Laterality: N/A;   PORT-A-CATH REMOVAL Right 06/14/2017   Procedure: REMOVAL PORT-A-CATH;  Surgeon: Rolm Bookbinder, MD;  Location: [facility];  Service: General;  Laterality: Right;   PORTACATH PLACEMENT Right 10/21/2015   Procedure: INSERTION PORT-A-CATH WITH ULTRASOUND ;  Surgeon: Rolm Bookbinder,  MD;  Location: [facility];  Service: General;  Laterality: Right;   PORTACATH PLACEMENT N/A 10/10/2022   Procedure: INSERTION PORT-A-CATH;  Surgeon: Rolm Bookbinder, MD;  Location: [facility];  Service: General;  Laterality: N/A;   REMOVAL OF BILATERAL TISSUE EXPANDERS WITH PLACEMENT OF BILATERAL BREAST IMPLANTS Bilateral 11/13/2014   Procedure: REMOVAL OF BILATERAL TISSUE EXPANDERS WITH PLACEMENT OF BILATERAL SILICONE IMPLANTS ;  Surgeon: Irene Limbo, MD;  Location: [facility];  Service: Plastics;  Laterality: Bilateral;   SIMPLE MASTECTOMY WITH AXILLARY SENTINEL NODE BIOPSY Bilateral 04/16/2014   Procedure: BILATERAL SKIN SPARING  MASTECTOMIES WITH LEFT AXILLARY SENTINEL NODE BIOPSY;  Surgeon: Rolm Bookbinder, MD;  Location: [facility];  Service: General;  Laterality: Bilateral;   Social  History:  Social History   Socioeconomic History   Marital status: Widowed    Spouse name: Not on file   Number of children: 3   Years of education: Not on file   Highest education level: Not on file  Occupational History   Occupation: Facilities manager: [employer]  Tobacco Use   Smoking status: Some Days    Packs/day: 0.00    Years: 0.00    Additional pack years: 0.00    Total pack years: 0.00    Types: Cigarettes    Last attempt to quit: 04/17/2014    Years since quitting: 8.5   Smokeless tobacco: Never   Tobacco comments:    smokes 1-2 cigs every week  Vaping Use   Vaping Use: Never used  Substance and Sexual Activity   Alcohol use: Yes    Comment: occasionally   Drug use: No   Sexual activity: Not on file  Other Topics Concern   Not on file  Social History Narrative   Not on file   Social Determinants of Health   Financial Resource Strain: Not on file  Food Insecurity: Not on file  Transportation Needs: Not on file  Physical Activity: Not on file  Stress: Not on file  Social Connections: Not on  file  Intimate Partner Violence: Not on file   Family History:  Family History  Problem Relation Age of Onset   Other Father        COVID complications   Breast cancer Paternal Aunt        dx > 50   Pancreatic cancer Paternal Uncle 34       smoker; deceased   Heart disease Maternal Grandmother    Heart attack Maternal Grandfather    Breast cancer Paternal Grandmother        dx 48s; ? if had 2nd BC in 28s; deceased 31s   Other Cousin        Lynch syndrome due to PMS2   BRCA 1/2 Cousin     Review of Systems: Constitutional: Doesn't report fevers, chills or abnormal weight loss Eyes: Doesn't report blurriness of vision Ears, nose, mouth, throat, and face: Doesn't report sore throat Respiratory: Doesn't report cough, dyspnea or wheezes Cardiovascular: Doesn't report palpitation, chest discomfort  Gastrointestinal:  Doesn't report nausea, constipation, diarrhea GU: Doesn't report incontinence Skin: Doesn't report skin rashes Neurological: Per HPI Musculoskeletal: Doesn't report joint pain Behavioral/Psych: Doesn't report anxiety  Physical Exam: There were no vitals filed for this visit.  KPS: 90. General: Alert, cooperative, pleasant, in no acute distress Head: Normal EENT: No conjunctival injection or scleral icterus.  Lungs: Resp effort normal Cardiac: Regular rate Abdomen: Non-distended abdomen Skin: No rashes cyanosis or petechiae. Extremities: No clubbing or edema  Neurologic Exam: Mental Status: Awake, alert, attentive to examiner. Oriented to self and environment. Language is fluent with intact comprehension.  Cranial Nerves: Visual acuity is grossly normal. Visual fields are full. Extra-ocular movements intact. No ptosis. Face is symmetric Motor: Tone and bulk are normal. Power is full in both arms and legs. Reflexes are symmetric, no pathologic reflexes present.  Sensory: Impaired left V2 distribution Gait: Normal.   Labs: I have reviewed the data as  listed    Component Value Date/Time   NA 143 10/31/2022 1109   NA 142 02/16/2017 1110   K 3.6 10/31/2022 1109   K 4.3 02/16/2017 1110   CL 104 10/31/2022 1109   CO2 33 (H) 10/31/2022 1109  CO2 27 02/16/2017 1110   GLUCOSE 111 (H) 10/31/2022 1109   GLUCOSE 190 (H) 02/16/2017 1110   BUN 6 10/31/2022 1109   BUN 7.9 02/16/2017 1110   CREATININE 0.62 10/31/2022 1109   CREATININE 0.8 02/16/2017 1110   CALCIUM 8.5 (L) 10/31/2022 1109   CALCIUM 9.4 02/16/2017 1110   PROT 5.9 (L) 10/31/2022 1109   PROT 7.4 02/16/2017 1110   ALBUMIN 3.4 (L) 10/31/2022 1109   ALBUMIN 3.7 02/16/2017 1110   AST 19 10/31/2022 1109   AST 27 02/16/2017 1110   ALT 19 10/31/2022 1109   ALT 50 02/16/2017 1110   ALKPHOS 104 10/31/2022 1109   ALKPHOS 88 02/16/2017 1110   BILITOT 0.4 10/31/2022 1109   BILITOT 0.41 02/16/2017 1110   GFRNONAA >60 10/31/2022 1109   GFRAA >60 03/15/2020 0926   Lab Results  Component Value Date   WBC 3.5 (L) 10/31/2022   NEUTROABS 1.8 10/31/2022   HGB 11.0 (L) 10/31/2022   HCT 31.5 (L) 10/31/2022   MCV 95.5 10/31/2022   PLT 329 10/31/2022    Imaging:  MR BRAIN W WO CONTRAST  Result Date: 11/08/2022 CLINICAL DATA:  Brain/CNS neoplasm, assess treatment response. Facial numbness since January 2024. History of breast cancer with metastases to bone. EXAM: MRI HEAD WITHOUT AND WITH CONTRAST TECHNIQUE: Multiplanar, multiecho pulse sequences of the brain and surrounding structures were obtained without and with intravenous contrast. CONTRAST:  10 mL Vueway. COMPARISON:  MRI brain 10/03/2022. FINDINGS: BRAIN New Lesions: Numerous new foci of faint enhancement, including in the inferior right cerebellar hemisphere (image 25 series 13), the mid left cerebellar hemisphere (image 41 series 13), along the posterior body of the right lateral ventricle (image 104 series 13), in the inferior right parietal lobe (image 105 series 13), in the left posterior frontal centrum semiovale (image 120  series 13), near the hand motor region of the right precentral gyrus (image 122 series 13), in the subcortical white matter underlying the right middle frontal gyrus (image 123 series 13), and in the subcortical white matter anterior to the dominant lesion along the right central sulcus (image 131 series 13). Larger lesions: None. Stable or Smaller lesions: *Unchanged 8 mm focus of enhancement along the medial aspect of the right central sulcus (image 132 series 14). *Unchanged 6 mm focus of enhancement along a minor sulcus in the anterior right frontal lobe (image 109 series 14). *Unchanged dural-based lesions overlying skull metastases in the left middle cranial fossa (image 53 series 14) and overlying the right superior frontal gyrus (image 33 series 15). Other Brain findings: No acute infarct or hemorrhage. No mass effect or midline shift. No hydrocephalus or extra-axial collection. Vascular: Nonocclusive thrombus in the right distal transverse and sigmoid sinuses, extending into the internal jugular vein (reference, images 45 and 21 of series 14). Skull and upper cervical spine: Unchanged marrow lesions, as above. Sinuses/Orbits: Redemonstrated subchoroidal enhancement in the left globe, suspicious for metastasis (image 56 series 13). Other: None. IMPRESSION: 1. Numerous new foci of faint enhancement throughout the brain, highly suspicious for metastatic disease. 2. Nonocclusive thrombus in the right distal transverse and sigmoid sinuses, extending into the internal jugular vein. 3. Redemonstrated subchoroidal enhancement in the left globe, suspicious for metastasis. These results will be called to the ordering clinician or representative by the Radiologist Assistant, and communication documented in the PACS or [employer]. Electronically Signed   By: Emmit Alexanders M.D.   On: 11/08/2022 13:22   DG C-Arm 1-60 Min  Result  Date: 10/10/2022 CLINICAL DATA:  Fluoroscopic assistance for placement of right  chest port EXAM: DG C-ARM 1-60 MIN FLUOROSCOPY: Fluoroscopy Time:  72 seconds Radiation Exposure Index (if provided by the fluoroscopic device): 21.71 mGy Number of Acquired Spot Images: 1 COMPARISON:  None Available. FINDINGS: Fluoroscopic assistance was provided for right IJ chest port with its tip in superior vena cava. IMPRESSION: Fluoroscopic assistance was provided for placement of right IJ chest port. Electronically Signed   By: Elmer Picker M.D.   On: 10/10/2022 15:52      Assessment/Plan Breast cancer metastasized to brain, unspecified laterality  Krystal [name] presents with clinical and radiographic syndrome consistent with CNS metastases from breast primary.  Recent T3 MRI study demonstrates additional tiny foci of parenchymal enhancement c/w metastases.    We discussed whole brain radiation vs radiosurgery.  She would prefer SRS if amenable.  Case will be discussed in detail in upcoming CNS tumor board, including assessment of left orbital metastasis.    Referral will be placed for consultation with radiation oncology.  Will con't with Enhertu with Dr. Lindi Adie.  We appreciate the opportunity to participate in the care of Krystal [name].    We ask that Krystal [name] return to clinic in 3 months following radiation therapy, or sooner as needed.  All questions were answered. The patient knows to call the clinic with any problems, questions or concerns. No barriers to learning were detected.  The total time spent in the encounter was 30 minutes and more than 50% was on counseling and review of test results   Ventura Sellers, MD Medical Director of Neuro-Oncology [facility] at [place] 11/09/22 9:03 AM

## 2022-11-09 NOTE — Telephone Encounter (Signed)
Called to introduce myself to the patient and inform pt about the consult scheduled with Dr. Isidore Moos on 4/9. She was thankful for the call and plans to attend. She has my contact information for future questions or concerns.  Mont Dutton R.T.(R)(T) Radiation Special Procedures Navigator

## 2022-11-13 ENCOUNTER — Inpatient Hospital Stay: Payer: Commercial Managed Care - PPO

## 2022-11-13 ENCOUNTER — Other Ambulatory Visit: Payer: Self-pay | Admitting: Radiation Therapy

## 2022-11-13 DIAGNOSIS — C7931 Secondary malignant neoplasm of brain: Secondary | ICD-10-CM

## 2022-11-13 NOTE — Progress Notes (Signed)
Radiation Oncology         [phone] ________________________________  Initial Outpatient Consultation  Name: Krystal [name] MRN: [id number]  Date: 11/14/2022  DOB: 1966/05/15  XB:[id number], [name]  Krystal [name] Georgeanna Lea, MD   REFERRING PHYSICIAN: Henreitta Leber, MD  DIAGNOSIS:  No diagnosis found.   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 Multifocal osseous metastatic disease diagnosed in 2021.   HISTORY OF PRESENT ILLNESS::Krystal [name] is a 57 y.o. female who presents today consideration of brain SRS in management of her recently diagnosed brain metastasis from a left breast cancer primary.   Metastatic left breast cancer history:   The patient underwent radiation therapy with Dr. Roselind Messier to the left breast in 2017. She later developed osseous metastatic disease to L5 and the left femur in 2021 and received palliative radiation therapy to both of these sites under Dr. Roselind Messier. Upon record review, the patient last followed up with Dr. Roselind Messier in March of 2021.  After completing radiation therapy, the patient continued with Ibrance, letrozole, and Xgeva for her bony metastatic disease under Dr. Pamelia Hoit.  The patient underwent genetic testing on 02/16/2021. 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 This puts the patient at a 34-80% lifetime risk of breast cancer and a 2.24% lifetime risk of ovarian cancer  She did have a CT CAP performed on 04/04/21 which demonstrated an increase in size of a subcarinal lymph node measuring 1.2 cm (previously 6 mm) , a right lower paratracheal lymph node ,measuring 6 mm, interval enlargement of several lung nodules including a lingular nodule measuring 9 mm (previously 5 mm) and a left  lower lobe lung nodule measuring 6 mm (previously 4 mm). CT otherwise showed stable bone metastases involving the right lateral rib, L5, and S1. The patient ultimately opted to continue with Ibrance and follow-up imaging.   Follow-up CT CAP on 10/04/2021 demonstrated: progressive sclerotic metastatic disease most notably involving T10, L2, the left sacrum, and iliac bones; stable and enlarging mediastinal and right hilar nodes; a slight interval increase in size of the lingular and left lower lobe pulmonary nodules; a new small left lingular nodule; and a stable 13 mm left adrenal gland nodule. CT otherwise showed no findings suspicious for recurrent chest wall tumor, supraclavicular or axillary adenopathy, or hepatic metastatic disease.   Given her evidence of disease progression, the patient was switched from [place] to [employer] and [employer] in April 2023.   CT AP on 01/10/22 showed: an increase in density and size of the sclerotic skeletal metastasis in the lumbar spine and pelvis. CT otherwise showed no interval change of the bilateral pulmonary nodules, stable thickening of the left adrenal gland, no evidence of mediastinal lymphadenopathy, and no evidence of hepatic metastasis or peritoneal metastasis.   The patient continued to receive abemaciclib for her bony metastatic disease for the remainder of 2023 into 2024. However, due to issues with her job (presumably insurance issues) the patient was not able to have any more follow-up CT's in 2023.   She was finally able to have a CT CAP performed on 08/23/22 which unfortunately showed evidence of disease progression, demonstrated by: new and enlarged pulmonary nodules consistent with progressive pulmonary metastases; enlarging small low cervical and mediastinal lymph nodes suspicious for early nodal metastasis;  mild progression of the multifocal sclerotic osseous metastatic disease; a new high right hepatic lobe (segment 7) 2.2 x 2.2 cm hypoattenuating  lesion; a possible additional 4A/B hepatic lesion; hepatic steatosis; and an isolated enlarging tiny omental nodule, suspicious for peritoneal metastasis.  Restaging PET scan on 09/07/22 again demonstrated disease progression, including the new nodal, pulmonary and hepatic metastases. PET also showed areas of variable FDG uptake with some new bone lesions and some areas of bony metastasis which appeared more focal, for instance in the pelvis where there was likely diffuse involvement on prior imaging. Many of the areas of known sclerosis did not show substantial uptake and some areas showed improvement / resolution of FDG uptake seen previously. However, definite active bony metastatic disease was demonstrated. PET also showed the previously demonstrated severe hepatic steatosis, and the presumed left adrenal adenoma.    Biopsy of a left supraclavicular lymph node on 09/22/22 showed metastatic carcinoma consistent with metastatic breast carcinoma. ER 90% positive with strong staining intensity; PR 0% negative; Proliferation marker Ki67 at 60%; Her2 status negative.   Dr. Pamelia HoitGudena recommends Caris molecular testing and consideration of the [place] 1 clinical trial. Since testing may take quite a while to result, Dr. Pamelia HoitGudena would like to start her on Enhertu.   Brain Metastasis:   The patient presented to Dr. Pamelia HoitGudena on 10/02/22 with complaints of left sided facial numbness through and around her nose, around her cheeks, and above her lips. She also endorsed having headaches and a lingering cough. In light of her complaints, Dr Pamelia HoitGudena recommended a stat MRI of the brain. (Since her cough did not respond to OTC medication, she was prescribed azithromycin).   Subsequent MRI of the brain with and without contrast on 10/03/22 showed small foci of enhancement in the right central sulcus and within the anterior right frontal sulcus concerning for leptomeningeal metastatic disease. Skull metastases with associated  dural tumor in the right frontal vertex region and left middle cranial fossa were also appreciated, as well an abnormal enhancement in the left lobe concerning for metastatic disease, and innumerable indeterminate foci of chronic cerebral microhemorrhage (which can be seen with cerebral amyloid angiopathy).   Subsequently, the patient was referred to Dr. Barbaraann CaoVaslow on 10/09/22 for further management. During this visit, the patient detailed having migraine like headaches with aura for at least the past 4-5 years. She elaborated that this may have progressed in the recent weeks. She otherwise denied any other neurological deficits and reported that she is able to carry out her ADL's independently.   Dr. Barbaraann CaoVaslow ultimately recommended proceeding with Dr. Earmon PhoenixGudena's recommendation [facility] Enhertu [facility] which we will discuss in detail today. She began Enhertu on 10/11/22.   The patient was diagnosed with the flu on 03/13 which has complicated her treatment course. During a follow-up visit with medical oncology on 10/31/23, she reported struggling with a persistent cough that leads to vomiting and headaches. She also endorsed persistent fatigue. Her overall biggest complaint is her cough which is chronic, unchanged, and began a couple months prior to starting the Enhertu.   Her last echo was apparently completed in 2018.  In the setting of this, as well as her persistent fatigue, recent viral infection, and cardiomegaly, Enhertu will be held until she can have a repeat echocardiogram completed.   Dr. Barbaraann CaoVaslow also recommended a repeat brain MRI with 3T magnet and SRS protocol fine cuts for better characterization of CNS burden and tumor location (detailed below).   MRI of the  brain with and without contrast on 11/06/22 shows  numerous new foci of faint enhancement, including in the inferior right cerebellar hemisphere, the mid left cerebellar hemisphere, along the posterior body of the right lateral ventricle, in the  inferior right parietal lobe, in the left posterior frontal centrum semiovale, near the hand motor region of the right precentral gyrus, in the subcortical white matter underlying the right middle frontal gyrus, and in the subcortical white matter anterior to the dominant lesion along the right central sulcus. MRI also demonstrates nonocclusive thrombus in the right distal transverse and sigmoid sinuses, extending into the internal jugular vein, and the previously demonstrated subchoroidal enhancement in the left globe, suspicious for metastasis.  PREVIOUS RADIATION THERAPY: Yes   2) Diagnosis:   Metastatic breast cancer with osseous metastases Treatment Intent: Palliative Radiation Treatment Dates: 08/21/2019 through 09/03/2019 Site Technique Total Dose (Gy) Dose per Fx (Gy) Completed Fx Beam Energies  Femur Left: Ext_Lt 3D 30/30 3 10/10 10X, 15X  Lumbar Spine: L5 3D 30/30 3 10/10 10X, 15X    1) Radiation Treatment Dates: 11/25/15 - 01/14/16:  1) Left chest wall treated to 45 Gy in 25 fractions  2) Scar boost treated to 18 Gy in 9 fractions     PAST MEDICAL HISTORY:  has a past medical history of Arthritis, BRCA1 positive, Cancer (HCC), Chest wall mass (09/2015), Complication of anesthesia, Cough with sputum (09/10/2015), Dental crowns present, Diabetes mellitus without complication (HCC), Family history of Lynch syndrome, Family history of pancreatic cancer, GERD (gastroesophageal reflux disease), Headache, History of breast cancer (2015), and Stuffy and runny nose (09/10/2015).    PAST SURGICAL HISTORY: Past Surgical History:  Procedure Laterality Date   ABDOMINAL HYSTERECTOMY     BREAST LUMPECTOMY Left 09/16/2015   Procedure: LEFT BREAST MASS EXCISION;  Surgeon: [name], MD;  Location: [facility];  Service: General;  Laterality: Left;   BREAST RECONSTRUCTION WITH PLACEMENT OF TISSUE EXPANDER AND FLEX HD (ACELLULAR HYDRATED DERMIS) Bilateral 04/16/2014   Procedure:  BILATERAL BREAST RECONSTRUCTION WITH PLACEMENT OF TISSUE EXPANDER AND FLEX HD (ACELLULAR HYDRATED DERMIS);  Surgeon: Glenna Fellows, MD;  Location: [facility];  Service: Plastics;  Laterality: Bilateral;   CESAREAN SECTION  1999; 2001; 2005   CHOLECYSTECTOMY  1995   COLONOSCOPY     DEBRIDEMENT AND CLOSURE WOUND Bilateral 05/15/2014   Procedure: DEBRIDEMENT AND CLOSURE OF BILATERAL MASECTOMY INCISIONS WITH BREAST EXPANSION;  Surgeon: Glenna Fellows, MD;  Location: [facility];  Service: Plastics;  Laterality: Bilateral;   LAPAROSCOPIC ASSISTED VAGINAL HYSTERECTOMY N/A 08/13/2014   Procedure: OPEN LAPAROSCOPY, EXPLORATORY LAPAROTOMY, TOTAL ABDOMINAL HYSTERECTOMY WITH BILATERAL SALPINGECTOMY AND BILATERAL OOPHORECTOMY;  Surgeon: Juluis Mire, MD;  Location[facility];  Service: Gynecology;  Laterality: N/A;   LIPOSUCTION WITH LIPOFILLING Bilateral 11/13/2014   Procedure: LIPOFILLING TO BILATERAL CHEST ;  Surgeon: Glenna Fellows, MD;  Location: [facility];  Service: Plastics;  Laterality: Bilateral;   MELANOMA EXCISION N/A 04/16/2014   Procedure: WIDE LOCAL EXCISION OF BACK MELANOMA;  Surgeon: [name], MD;  Location: [facility];  Service: General;  Laterality: N/A;   PORT-A-CATH REMOVAL Right 06/14/2017   Procedure: REMOVAL PORT-A-CATH;  Surgeon: [name], MD;  [facility];  Service: General;  Laterality: Right;   PORTACATH PLACEMENT Right 10/21/2015   Procedure: INSERTION PORT-A-CATH WITH ULTRASOUND ;  Surgeon: [name], MD;  Location: [facility];  Service: General;  Laterality: Right;   PORTACATH  PLACEMENT N/A 10/10/2022   Procedure: INSERTION PORT-A-CATH;  Surgeon: [name], MD;  Location: [facility];  Service: General;  Laterality: N/A;   REMOVAL OF BILATERAL TISSUE EXPANDERS WITH PLACEMENT OF BILATERAL BREAST IMPLANTS Bilateral 11/13/2014   Procedure:  REMOVAL OF BILATERAL TISSUE EXPANDERS WITH PLACEMENT OF BILATERAL SILICONE IMPLANTS ;  Surgeon: Glenna Fellows, MD;  Location: [facility];  Service: Plastics;  Laterality: Bilateral;   SIMPLE MASTECTOMY WITH AXILLARY SENTINEL NODE BIOPSY Bilateral 04/16/2014   Procedure: BILATERAL SKIN SPARING  MASTECTOMIES WITH LEFT AXILLARY SENTINEL NODE BIOPSY;  Surgeon: [name], MD;  Location: [facility];  Service: General;  Laterality: Bilateral;    FAMILY HISTORY: family history includes BRCA 1/2 in her cousin; Breast cancer in her paternal aunt and paternal grandmother; Heart attack in her maternal grandfather; Heart disease in her maternal grandmother; Other in her cousin and father; Pancreatic cancer (age of onset: 44) in her paternal uncle.  SOCIAL HISTORY:  reports that she has been smoking cigarettes. She has never used smokeless tobacco. She reports current alcohol use. She reports that she does not use drugs.  ALLERGIES: Dilaudid [hydromorphone] and Codeine  MEDICATIONS:  Current Outpatient Medications  Medication Sig Dispense Refill   aspirin EC 81 MG tablet Take 1 tablet (81 mg total) by mouth daily. Swallow whole. 90 tablet 3   aspirin-acetaminophen-caffeine (EXCEDRIN MIGRAINE) 250-250-65 MG tablet Take 2 tablets by mouth every 6 (six) hours as needed for headache.     CALCIUM PO Take 1,200 mg by mouth 2 (two) times daily.     dexamethasone (DECADRON) 4 MG tablet Take 1 tablets (4 mg) by mouth daily for 3 days after chemotherapy. Take with food. 30 tablet 1   dicyclomine (BENTYL) 20 MG tablet Take 1 tablet (20 mg total) by mouth every 6 (six) hours as needed for up to 7 days for spasms. 28 tablet 0   esomeprazole (NEXIUM) 40 MG capsule Take 40 mg by mouth daily.     lidocaine-prilocaine (EMLA) cream Apply to affected area once 30 g 3   loperamide (IMODIUM) 2 MG capsule Take 2 mg by mouth daily as needed for diarrhea or loose stools. Take 2 tablets at  first diarrhea and then 1 tablet after additional loose stool. Max number in 24 hours is 8 tablets.     loratadine (CLARITIN) 10 MG tablet Take 10 mg by mouth daily.     meloxicam (MOBIC) 15 MG tablet TAKE 1 TABLET(15 MG) BY MOUTH DAILY AS NEEDED FOR PAIN (Patient taking differently: Take 15 mg by mouth daily.) 30 tablet 1   ondansetron (ZOFRAN) 8 MG tablet Take 1 tablet (8 mg total) by mouth every 8 (eight) hours as needed for nausea or vomiting. Start on the third day after chemotherapy. 30 tablet 1   prochlorperazine (COMPAZINE) 10 MG tablet Take 1 tablet (10 mg total) by mouth every 6 (six) hours as needed for nausea or vomiting. 30 tablet 1   promethazine (PHENERGAN) 25 MG suppository Place 1 suppository (25 mg total) rectally every 6 (six) hours as needed for nausea or vomiting. 12 each 0   rosuvastatin (CRESTOR) 10 MG tablet Take 1 tablet (10 mg total) by mouth daily. 90 tablet 3   traMADol (ULTRAM) 50 MG tablet Take 1 tablet (50 mg total) by mouth every 6 (six) hours as needed. 10 tablet 0   traZODone (DESYREL) 50 MG tablet TAKE 1 TABLET(50 MG) BY MOUTH AT BEDTIME 30 tablet 11   venlafaxine XR (EFFEXOR-XR)  75 MG 24 hr capsule TAKE 1 CAPSULE(75 MG) BY MOUTH AT BEDTIME 90 capsule 3   No current facility-administered medications for this encounter.    REVIEW OF SYSTEMS:  As above.   PHYSICAL EXAM:  vitals were not taken for this visit.   General: Alert and oriented, in no acute distress *** HEENT: Head is normocephalic. Extraocular movements are intact. Oropharynx is clear. Neck: Neck is supple, no palpable cervical or supraclavicular lymphadenopathy. Heart: Regular in rate and rhythm with no murmurs, rubs, or gallops. Chest: Clear to auscultation bilaterally, with no rhonchi, wheezes, or rales. Abdomen: Soft, nontender, nondistended, with no rigidity or guarding. Extremities: No cyanosis or edema. Lymphatics: see Neck Exam Skin: No concerning lesions. Musculoskeletal: symmetric  strength and muscle tone throughout. Neurologic: Cranial nerves II through XII are grossly intact. No obvious focalities. Speech is fluent. Coordination is intact. Psychiatric: Judgment and insight are intact. Affect is appropriate.   LABORATORY DATA:  Lab Results  Component Value Date   WBC 3.5 (L) 10/31/2022   HGB 11.0 (L) 10/31/2022   HCT 31.5 (L) 10/31/2022   MCV 95.5 10/31/2022   PLT 329 10/31/2022   CMP     Component Value Date/Time   NA 143 10/31/2022 1109   NA 142 02/16/2017 1110   K 3.6 10/31/2022 1109   K 4.3 02/16/2017 1110   CL 104 10/31/2022 1109   CO2 33 (H) 10/31/2022 1109   CO2 27 02/16/2017 1110   GLUCOSE 111 (H) 10/31/2022 1109   GLUCOSE 190 (H) 02/16/2017 1110   BUN 6 10/31/2022 1109   BUN 7.9 02/16/2017 1110   CREATININE 0.62 10/31/2022 1109   CREATININE 0.8 02/16/2017 1110   CALCIUM 8.5 (L) 10/31/2022 1109   CALCIUM 9.4 02/16/2017 1110   PROT 5.9 (L) 10/31/2022 1109   PROT 7.4 02/16/2017 1110   ALBUMIN 3.4 (L) 10/31/2022 1109   ALBUMIN 3.7 02/16/2017 1110   AST 19 10/31/2022 1109   AST 27 02/16/2017 1110   ALT 19 10/31/2022 1109   ALT 50 02/16/2017 1110   ALKPHOS 104 10/31/2022 1109   ALKPHOS 88 02/16/2017 1110   BILITOT 0.4 10/31/2022 1109   BILITOT 0.41 02/16/2017 1110   GFRNONAA >60 10/31/2022 1109   GFRAA >60 03/15/2020 0926         RADIOGRAPHY: MR BRAIN W WO CONTRAST  Result Date: 11/08/2022 CLINICAL DATA:  Brain/CNS neoplasm, assess treatment response. Facial numbness since January 2024. History of breast cancer with metastases to bone. EXAM: MRI HEAD WITHOUT AND WITH CONTRAST TECHNIQUE: Multiplanar, multiecho pulse sequences of the brain and surrounding structures were obtained without and with intravenous contrast. CONTRAST:  10 mL [employer]. COMPARISON:  MRI brain 10/03/2022. FINDINGS: BRAIN New Lesions: Numerous new foci of faint enhancement, including in the inferior right cerebellar hemisphere (image 25 series 13), the mid left  cerebellar hemisphere (image 41 series 13), along the posterior body of the right lateral ventricle (image 104 series 13), in the inferior right parietal lobe (image 105 series 13), in the left posterior frontal centrum semiovale (image 120 series 13), near the hand motor region of the right precentral gyrus (image 122 series 13), in the subcortical white matter underlying the right middle frontal gyrus (image 123 series 13), and in the subcortical white matter anterior to the dominant lesion along the right central sulcus (image 131 series 13). Larger lesions: None. Stable or Smaller lesions: *Unchanged 8 mm focus of enhancement along the medial aspect of the right central sulcus (image 132 series  14). *Unchanged 6 mm focus of enhancement along a minor sulcus in the anterior right frontal lobe (image 109 series 14). *Unchanged dural-based lesions overlying skull metastases in the left middle cranial fossa (image 53 series 14) and overlying the right superior frontal gyrus (image 33 series 15). Other Brain findings: No acute infarct or hemorrhage. No mass effect or midline shift. No hydrocephalus or extra-axial collection. Vascular: Nonocclusive thrombus in the right distal transverse and sigmoid sinuses, extending into the internal jugular vein (reference, images 45 and 21 of series 14). Skull and upper cervical spine: Unchanged marrow lesions, as above. Sinuses/Orbits: Redemonstrated subchoroidal enhancement in the left globe, suspicious for metastasis (image 56 series 13). Other: None. IMPRESSION: 1. Numerous new foci of faint enhancement throughout the brain, highly suspicious for metastatic disease. 2. Nonocclusive thrombus in the right distal transverse and sigmoid sinuses, extending into the internal jugular vein. 3. Redemonstrated subchoroidal enhancement in the left globe, suspicious for metastasis. These results will be called to the ordering clinician or representative by the Radiologist Assistant, and  communication documented in the PACS or [employer]. Electronically Signed   By: Orvan Falconer M.D.   On: 11/08/2022 13:22      IMPRESSION/PLAN: This is a very pleasant *** year old *** with metastatic disease to the brain.  I had a lengthy discussion with the patient after reviewing their MRI results with them.  We spoke about whole brain radiotherapy versus stereotactic radiosurgery to the brain. We spoke about the differing risks benefits and side effects of both of these treatments. During part of our discussion, we spoke about the hair loss, fatigue and cognitive effects that can result from whole brain radiotherapy.  Additionally, we spoke about radionecrosis that can result from stereotactic radiosurgery. I explained that whole brain radiotherapy is more comprehensive and therefore can decrease the chance of recurrences elsewhere in the brain, while stereotactic radiosurgery only treats the areas of gross disease while sparing the rest of the brain parenchyma.  After lengthy discussion, the patient would like to proceed with stereotactic brain radiosurgery to their metastatic disease. They will meet with neurosurgery in the near future to discuss this further; a neurosurgeon will participate in their case.  CT simulation will take place on *** and treatment on ***.  I plan to deliver *** Gy in 1 fraction to ***.  On date of service, in total, I spent *** minutes on this encounter. Patient was seen in person.   __________________________________________   Lonie Peak, MD  This document serves as a record of services personally performed by Lonie Peak, MD. It was created on her behalf by Neena Rhymes, a trained medical scribe. The creation of this record is based on the scribe's personal observations and the provider's statements to them. This document has been checked and approved by the attending provider.

## 2022-11-14 ENCOUNTER — Other Ambulatory Visit: Payer: Self-pay | Admitting: Radiation Therapy

## 2022-11-14 ENCOUNTER — Encounter: Payer: Self-pay | Admitting: Radiation Oncology

## 2022-11-14 ENCOUNTER — Ambulatory Visit
Admission: RE | Admit: 2022-11-14 | Discharge: 2022-11-14 | Disposition: A | Discharge status: Home / Self Care | Payer: Commercial Managed Care - PPO | Source: Ambulatory Visit | Attending: Radiation Oncology | Admitting: Radiation Oncology

## 2022-11-14 ENCOUNTER — Other Ambulatory Visit: Payer: Self-pay

## 2022-11-14 VITALS — BP 113/77 | HR 85 | Temp 97.5°F | Resp 20 | Ht 64.0 in | Wt 220.0 lb

## 2022-11-14 DIAGNOSIS — C7931 Secondary malignant neoplasm of brain: Secondary | ICD-10-CM

## 2022-11-14 NOTE — Progress Notes (Signed)
Order entered to access and de-access port the day of brain MRI at [facility].   Bina Veenstra R.T.(R)(T) Radiation Special Procedures Navigator  

## 2022-11-15 ENCOUNTER — Other Ambulatory Visit: Payer: Self-pay | Admitting: Adult Health

## 2022-11-15 ENCOUNTER — Telehealth: Payer: Self-pay | Admitting: Radiation Therapy

## 2022-11-15 ENCOUNTER — Other Ambulatory Visit: Payer: Self-pay

## 2022-11-15 DIAGNOSIS — R053 Chronic cough: Secondary | ICD-10-CM

## 2022-11-15 DIAGNOSIS — D7219 Other eosinophilia: Secondary | ICD-10-CM

## 2022-11-15 NOTE — Telephone Encounter (Signed)
Krystal [name] sent me an email requesting a call regarding her visit with Krystal [name] yesterday. I called in response to this request. She shared that she did not have a good experience at that office and if the information obtained during her evaluation is not enough for Krystal [name], she would like to see a different provider. I have messaged Krystal [name] and shared the details of that call with her and her nursing staff.   Krystal [name] R.T.(R)(T) Radiation Special Procedures Navigator

## 2022-11-15 NOTE — Progress Notes (Signed)
Patient with cough and eosinophilia.  I discussed this patient with pulmonology, orders for RAST allergy testing and IGE to evaluate etiology for chronic cough on 4/19.  She has echo on 4/16 so if cardiac etiology is determined, I will d/c the lab testing.  Lillard Anes, NP 11/15/22 11:54 AM Medical Oncology and Hematology [facility] [address] [place], [place] [address] Tel. [phone]    Fax. [phone]

## 2022-11-17 ENCOUNTER — Other Ambulatory Visit: Payer: Self-pay

## 2022-11-20 ENCOUNTER — Telehealth: Payer: Self-pay | Admitting: Hematology and Oncology

## 2022-11-20 NOTE — Telephone Encounter (Signed)
Scheduled appointments per WQ. Patient is aware of all made appointments. 

## 2022-11-21 ENCOUNTER — Ambulatory Visit (HOSPITAL_COMMUNITY)
Admission: RE | Admit: 2022-11-21 | Discharge: 2022-11-21 | Disposition: A | Discharge status: Home / Self Care | Payer: Commercial Managed Care - PPO | Source: Ambulatory Visit | Attending: Adult Health | Admitting: Adult Health

## 2022-11-21 ENCOUNTER — Other Ambulatory Visit: Payer: Self-pay

## 2022-11-21 DIAGNOSIS — E119 Type 2 diabetes mellitus without complications: Secondary | ICD-10-CM | POA: Diagnosis not present

## 2022-11-21 DIAGNOSIS — C7951 Secondary malignant neoplasm of bone: Secondary | ICD-10-CM | POA: Diagnosis not present

## 2022-11-21 DIAGNOSIS — I427 Cardiomyopathy due to drug and external agent: Secondary | ICD-10-CM | POA: Insufficient documentation

## 2022-11-21 DIAGNOSIS — Z0189 Encounter for other specified special examinations: Secondary | ICD-10-CM | POA: Diagnosis not present

## 2022-11-21 DIAGNOSIS — I517 Cardiomegaly: Secondary | ICD-10-CM | POA: Diagnosis not present

## 2022-11-21 DIAGNOSIS — I77819 Aortic ectasia, unspecified site: Secondary | ICD-10-CM | POA: Diagnosis not present

## 2022-11-21 LAB — ECHOCARDIOGRAM COMPLETE
AR max vel: 3.13 cm2
AV Area VTI: 3.23 cm2
AV Area mean vel: 3.16 cm2
AV Mean grad: 4 mmHg
AV Peak grad: 7.3 mmHg
Ao pk vel: 1.36 m/s
Area-P 1/2: 2.95 cm2
Calc EF: 51.6 %
MV VTI: 4.52 cm2
S' Lateral: 3.3 cm
Single Plane A2C EF: 49.8 %
Single Plane A4C EF: 51.9 %

## 2022-11-21 NOTE — Progress Notes (Signed)
Patient Care Team: Patient, [name] Serena Croissant, MD as Medical Oncologist (Hematology and Oncology) [name], MD as Consulting Physician (Radiation Oncology) [name], NP as Nurse Practitioner (Hematology and Oncology) [name], MD as Consulting Physician (General Surgery) [name], RPH-CPP as Pharmacist (Hematology and Oncology)  DIAGNOSIS:  Encounter Diagnoses  Name Primary?   Malignant neoplasm of central portion of left breast in female, estrogen receptor positive Yes   Primary malignant neoplasm of breast with metastasis     SUMMARY OF ONCOLOGIC HISTORY: Oncology History  [name]  04/02/2014 Genetic Testing   BRCA1 [id number] pathogenic mutation identified on gene sequce and del/dup analyses of BRCA1 and BRCA2.  BRCA1 [id number] VUS identified as well.  The report date is 04/02/2014.  UPDATE: BRCA1 [id number] VUS has been reclassified as a likely benign variant.  The reclassification date is 04/21/2020.   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 N0 M0 stage IA: Oncotype DX 13 (ROR 9%)   04/16/2014 Surgery   Left upper back melanoma resected no residual melanoma identified on re-resection margins negative   05/26/2014 - 06/08/2015 Anti-estrogen oral therapy   Tamoxifen 20 mg daily with a plan to switch her to aromatase inhibitors once she gets oophorectomy ( patient did not continue antiestrogen therapy by choice)   08/13/2014 Surgery   oophorectomy with total abdominal hysterectomy   09/08/2015 -  Anti-estrogen oral therapy   Resumed anastrozole after she found recurrence of breast [name]; stopped in January 2018, started letrozole 2.5 mg daily 10/12/2016; switched to Exemestane 25 mg daily  on 01/03/17   09/16/2015 Surgery   Skin, left: IDC grade 3; 0.5 cm, margins negative, ER 95%, PR 10%, HER-2 positive ratio 2.08   11/08/2015 - 02/2017 Chemotherapy   Herceptin every 3 weeks plus anastrozole   11/24/2015 - 01/14/2016 Radiation Therapy   Adjuvant radiation therapy by Dr. Roselind Messier   01/2017 -  Anti-estrogen oral therapy   Exemestane daily (stopped others due to side effects), due to muscle aches and pains switch to tamoxifen 5 mg daily starting 12/18/2017   08/07/2019 Relapse/Recurrence   Bone scan: Sclerotic metastatic lesions involving left femoral neck, right ninth rib and entire L5 vertebra; MRI left hip: Diffuse signal abnormality L5 vertebral body with possible extraosseous extension   08/22/2019 - 09/03/2019 Radiation Therapy   Palliative radiation with Dr. Roselind Messier to L5 and left proixmal femur.    10/2021 Treatment Plan Change   Fulvestrant and Verzenio   08/23/2022 Imaging   CT CAP 08/23/2022: New and enlarged pulmonary nodule consistent with progression.  Enlarged small low cervical and mediastinal nodes.  Multiple bone metastases minimally progressive.  Liver lesions evaluation is challenging because of hepatic steatosis with suspected liver progression, isolated tiny omental nodule     09/07/2022 PET scan   IMPRESSION: 1. Worsening of disease now with nodal, pulmonary and hepatic metastases. 2. Areas of variable FDG uptake with some new bone lesions and some areas of bony metastasis which are more focal, for instance in the pelvis where there was likely diffuse involvement on prior imaging. Many of the areas of sclerosis do not show substantial uptake as outlined above and some areas show improvement/resolution of FDG uptake seen previously but there are signs of active bony metastatic disease  currently. 3. Likely LEFT adrenal adenoma. 4. Severe hepatic steatosis. 5. Aortic atherosclerosis.   Primary malignant neoplasm of breast with metastasis  08/25/2019 Initial  Diagnosis   Primary malignant neoplasm of breast with metastasis (HCC)   10/11/2022 -  Chemotherapy   Patient is on Treatment Plan : BREAST METASTATIC Fam-Trastuzumab Deruxtecan-nxki (Enhertu) (5.4) q21d       CHIEF COMPLIANT: Follow-up Enhertu cycle 2   INTERVAL HISTORY: Krystal [name] is a 57 year old above-mentioned cement site breast [name]. Currently on Enhertu. She reports that she had the flu that lasted for about 2 weeks. She states that she feels a lot better.  Patient had a flulike symptoms and had profound nausea and vomiting after cycle 1.  She had lost about 10 pounds.   ALLERGIES:  is allergic to dilaudid [hydromorphone] and codeine.  MEDICATIONS:  Current Outpatient Medications  Medication Sig Dispense Refill   aspirin EC 81 MG tablet Take 1 tablet (81 mg total) by mouth daily. Swallow whole. 90 tablet 3   aspirin-acetaminophen-caffeine (EXCEDRIN MIGRAINE) 250-250-65 MG tablet Take 2 tablets by mouth every 6 (six) hours as needed for headache.     CALCIUM PO Take 1,200 mg by mouth 2 (two) times daily.     dexamethasone (DECADRON) 4 MG tablet Take 1 tablets (4 mg) by mouth daily for 3 days after chemotherapy. Take with food. 30 tablet 1   esomeprazole (NEXIUM) 40 MG capsule Take 40 mg by mouth daily.     lidocaine-prilocaine (EMLA) cream Apply to affected area once 30 g 3   loperamide (IMODIUM) 2 MG capsule Take 2 mg by mouth daily as needed for diarrhea or loose stools. Take 2 tablets at first diarrhea and then 1 tablet after additional loose stool. Max number in 24 hours is 8 tablets.     loratadine (CLARITIN) 10 MG tablet Take 10 mg by mouth daily.     meloxicam (MOBIC) 15 MG tablet TAKE 1 TABLET(15 MG) BY MOUTH DAILY AS NEEDED FOR PAIN (Patient taking differently: Take 15 mg by mouth daily.) 30 tablet 1   ondansetron (ZOFRAN) 8 MG tablet Take 1 tablet (8 mg total) by mouth every 8 (eight) hours as needed for nausea or vomiting. Start on the third day after chemotherapy. 30  tablet 1   prochlorperazine (COMPAZINE) 10 MG tablet Take 1 tablet (10 mg total) by mouth every 6 (six) hours as needed for nausea or vomiting. 30 tablet 1   promethazine (PHENERGAN) 25 MG suppository Place 1 suppository (25 mg total) rectally every 6 (six) hours as needed for nausea or vomiting. 12 each 0   rosuvastatin (CRESTOR) 10 MG tablet Take 1 tablet (10 mg total) by mouth daily. 90 tablet 3   traMADol (ULTRAM) 50 MG tablet Take 1 tablet (50 mg total) by mouth every 6 (six) hours as needed. 10 tablet 0   traZODone (DESYREL) 50 MG tablet TAKE 1 TABLET(50 MG) BY MOUTH AT BEDTIME 30 tablet 11   venlafaxine XR (EFFEXOR-XR) 75 MG 24 hr capsule TAKE 1 CAPSULE(75 MG) BY MOUTH AT BEDTIME 90 capsule 3   dicyclomine (BENTYL) 20 MG tablet Take 1 tablet (20 mg total) by mouth every 6 (six) hours as needed for up to 7 days for spasms. 28 tablet 0   No current facility-administered medications for this visit.   Facility-Administered Medications Ordered in Other Visits  Medication Dose Route Frequency Provider Last Rate Last Admin   fam-trastuzumab deruxtecan-nxki (ENHERTU) 400 mg in dextrose 5 % 100 mL chemo infusion  4.3 mg/kg (Treatment Plan Recorded) Intravenous Once Serena Croissant, MD        PHYSICAL EXAMINATION: ECOG PERFORMANCE STATUS: 1 - Symptomatic but completely ambulatory  Vitals:   11/24/22 1150  BP: 132/68  Pulse: 83  Resp: 18  Temp: 97.7 F (36.5 C)  SpO2: 97%   Filed Weights   11/24/22 1150  Weight: 221 lb 11.2 oz (100.6 kg)     LABORATORY DATA:  I have reviewed the data as listed    Latest Ref Rng & Units 11/24/2022   11:30 AM 10/31/2022   11:09 AM 10/18/2022   11:53 AM  CMP  Glucose 70 - 99 mg/dL 161  096  045   BUN 6 - 20 mg/dL Creatinine 0.44 - 1.00 mg/dL 4.09  8.11  9.14   Sodium 135 - 145 mmol/L 141  143  137   Potassium 3.5 - 5.1 mmol/L 4.0  3.6  3.3   Chloride 98 - 111 mmol/L 106  104  99   CO2 22 - 32 mmol/L 29  33  26   Calcium 8.9 - 10.3 mg/dL  9.2  8.5  9.2   Total Protein 6.5 - 8.1 g/dL 6.8  5.9  7.4   Total Bilirubin 0.3 - 1.2 mg/dL 0.4  0.4  0.7   Alkaline Phos 38 - 126 U/L 116  104  113   AST 15 - 41 U/L 43  19  36   ALT 0 - 44 U/L 58  19  54     Lab Results  Component Value Date   WBC 3.3 (L) 11/24/2022   HGB 12.2 11/24/2022   HCT 36.4 11/24/2022   MCV 97.6 11/24/2022   PLT 202 11/24/2022   NEUTROABS 1.7 11/24/2022    ASSESSMENT & PLAN:  [name] (HCC) 08/22/2019: PET CT scan: Metastatic disease in the chest with multiple lung nodules all subcentimeter size and hypermetabolic lymph nodes (right paratracheal node 7 mm, subcarinal node 1 cm, right hilar node 6 mm), retroperitoneal and upper pelvic common iliac lymph nodes, bone metastases especially L5, L4, T6, right 10th rib, left proximal femur   Bone biopsy was not felt to be reliable in addition to the effect of radiation on the bone. Palliative radiation completed 09/03/2019 [facility]: BRCA1 mutation, TMB 5.36, MSI: Normal   Prior treatment: Ibrance with letrozole and Xgeva for bone metastases switched to abemaciclib and Fulvestrant   Bone metastasis: Patient does not want to take bisphosphonates CT CAP 08/23/2022: New and enlarged pulmonary nodule consistent with progression.  Enlarged small low cervical and mediastinal nodes.  Multiple bone metastases minimally progressive.  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  Skull metastases, abnormal enhancement left globe  ---------------------------------------------------------------- Current treatment:: Enhertu cycle  2 Enhertu toxicities: Severe flu after the first cycle of chemo and she had nausea vomiting and lost 10 pounds. Fatigue  Return to clinic in 3 weeks for cycle 3    Orders Placed This Encounter  Procedures   CT CHEST ABDOMEN PELVIS W CONTRAST    Standing Status:   Future    Standing Expiration Date:   11/24/2023  Order Specific Question:   If indicated for the ordered procedure, I authorize the administration of contrast media per Radiology protocol    Answer:   Yes    Order Specific Question:   Does the patient have a contrast media/X-ray dye allergy?    Answer:   No    Order Specific Question:   Is patient pregnant?    Answer:   No    Order Specific Question:   Preferred imaging location?    Answer:   [facility]    Order Specific Question:   If indicated for the ordered procedure, I authorize the administration of oral contrast media per Radiology protocol    Answer:   Yes   The patient has a good understanding of the overall plan. she agrees with it. she will call with any problems that may develop before the next visit here. Total time spent: 30 mins including face to face time and time spent for planning, charting and co-ordination of care   Tamsen Meek, MD 11/24/22    I Janan Ridge am acting as a Neurosurgeon for [employer]  I have reviewed the above documentation for accuracy and completeness, and I agree with the above.

## 2022-11-22 ENCOUNTER — Ambulatory Visit: Payer: Commercial Managed Care - PPO

## 2022-11-22 ENCOUNTER — Other Ambulatory Visit: Payer: Commercial Managed Care - PPO

## 2022-11-22 ENCOUNTER — Telehealth: Payer: Self-pay

## 2022-11-22 ENCOUNTER — Ambulatory Visit: Payer: Commercial Managed Care - PPO | Admitting: Hematology and Oncology

## 2022-11-22 NOTE — Telephone Encounter (Signed)
Returned Pt's call regarding ECHO results. EF 55-60% and global longitudinal strain normal. Advised Pt that she will continue with treatment as scheduled this Friday. Pt verbalized understanding.

## 2022-11-23 MED FILL — Dexamethasone Sodium Phosphate Inj 100 MG/10ML: INTRAMUSCULAR | Qty: 1 | Status: AC

## 2022-11-23 MED FILL — Fosaprepitant Dimeglumine For IV Infusion 150 MG (Base Eq): INTRAVENOUS | Qty: 5 | Status: AC

## 2022-11-24 ENCOUNTER — Inpatient Hospital Stay: Payer: Commercial Managed Care - PPO

## 2022-11-24 ENCOUNTER — Encounter: Payer: Self-pay | Admitting: Radiation Oncology

## 2022-11-24 ENCOUNTER — Other Ambulatory Visit: Payer: Self-pay | Admitting: *Deleted

## 2022-11-24 ENCOUNTER — Ambulatory Visit
Admission: RE | Admit: 2022-11-24 | Discharge: 2022-11-24 | Disposition: A | Discharge status: Home / Self Care | Payer: Commercial Managed Care - PPO | Source: Ambulatory Visit | Attending: Radiation Oncology | Admitting: Radiation Oncology

## 2022-11-24 ENCOUNTER — Inpatient Hospital Stay: Payer: Commercial Managed Care - PPO | Admitting: Hematology and Oncology

## 2022-11-24 ENCOUNTER — Other Ambulatory Visit: Payer: Self-pay

## 2022-11-24 VITALS — BP 132/68 | HR 83 | Temp 97.7°F | Resp 18 | Ht 64.0 in | Wt 221.7 lb

## 2022-11-24 VITALS — BP 107/69 | HR 75 | Temp 97.9°F | Resp 17

## 2022-11-24 DIAGNOSIS — Z85820 Personal history of malignant melanoma of skin: Secondary | ICD-10-CM | POA: Insufficient documentation

## 2022-11-24 DIAGNOSIS — C7931 Secondary malignant neoplasm of brain: Secondary | ICD-10-CM | POA: Insufficient documentation

## 2022-11-24 DIAGNOSIS — C6932 Malignant neoplasm of left choroid: Secondary | ICD-10-CM

## 2022-11-24 DIAGNOSIS — Z79899 Other long term (current) drug therapy: Secondary | ICD-10-CM | POA: Insufficient documentation

## 2022-11-24 DIAGNOSIS — C787 Secondary malignant neoplasm of liver and intrahepatic bile duct: Secondary | ICD-10-CM | POA: Insufficient documentation

## 2022-11-24 DIAGNOSIS — Z87891 Personal history of nicotine dependence: Secondary | ICD-10-CM | POA: Insufficient documentation

## 2022-11-24 DIAGNOSIS — Z1501 Genetic susceptibility to malignant neoplasm of breast: Secondary | ICD-10-CM | POA: Insufficient documentation

## 2022-11-24 DIAGNOSIS — C7951 Secondary malignant neoplasm of bone: Secondary | ICD-10-CM | POA: Insufficient documentation

## 2022-11-24 DIAGNOSIS — C50112 Malignant neoplasm of central portion of left female breast: Secondary | ICD-10-CM | POA: Insufficient documentation

## 2022-11-24 DIAGNOSIS — E119 Type 2 diabetes mellitus without complications: Secondary | ICD-10-CM | POA: Insufficient documentation

## 2022-11-24 DIAGNOSIS — Z791 Long term (current) use of non-steroidal anti-inflammatories (NSAID): Secondary | ICD-10-CM | POA: Insufficient documentation

## 2022-11-24 DIAGNOSIS — Z170 Estrogen receptor positive status [ER+]: Secondary | ICD-10-CM | POA: Insufficient documentation

## 2022-11-24 DIAGNOSIS — Z7952 Long term (current) use of systemic steroids: Secondary | ICD-10-CM | POA: Insufficient documentation

## 2022-11-24 DIAGNOSIS — C7949 Secondary malignant neoplasm of other parts of nervous system: Secondary | ICD-10-CM | POA: Insufficient documentation

## 2022-11-24 DIAGNOSIS — C50919 Malignant neoplasm of unspecified site of unspecified female breast: Secondary | ICD-10-CM

## 2022-11-24 DIAGNOSIS — Z7982 Long term (current) use of aspirin: Secondary | ICD-10-CM | POA: Insufficient documentation

## 2022-11-24 LAB — CBC WITH DIFFERENTIAL (CANCER CENTER ONLY)
Abs Immature Granulocytes: 0 10*3/uL (ref 0.00–0.07)
Basophils Absolute: 0 10*3/uL (ref 0.0–0.1)
Basophils Relative: 1 %
Eosinophils Absolute: 0.3 10*3/uL (ref 0.0–0.5)
Eosinophils Relative: 9 %
HCT: 36.4 % (ref 36.0–46.0)
Hemoglobin: 12.2 g/dL (ref 12.0–15.0)
Immature Granulocytes: 0 %
Lymphocytes Relative: 29 %
Lymphs Abs: 1 10*3/uL (ref 0.7–4.0)
MCH: 32.7 pg (ref 26.0–34.0)
MCHC: 33.5 g/dL (ref 30.0–36.0)
MCV: 97.6 fL (ref 80.0–100.0)
Monocytes Absolute: 0.3 10*3/uL (ref 0.1–1.0)
Monocytes Relative: 9 %
Neutro Abs: 1.7 10*3/uL (ref 1.7–7.7)
Neutrophils Relative %: 52 %
Platelet Count: 202 10*3/uL (ref 150–400)
RBC: 3.73 MIL/uL — ABNORMAL LOW (ref 3.87–5.11)
RDW: 13.4 % (ref 11.5–15.5)
WBC Count: 3.3 10*3/uL — ABNORMAL LOW (ref 4.0–10.5)
nRBC: 0 % (ref 0.0–0.2)

## 2022-11-24 LAB — CMP (CANCER CENTER ONLY)
ALT: 58 U/L — ABNORMAL HIGH (ref 0–44)
AST: 43 U/L — ABNORMAL HIGH (ref 15–41)
Albumin: 4 g/dL (ref 3.5–5.0)
Alkaline Phosphatase: 116 U/L (ref 38–126)
Anion gap: 6 (ref 5–15)
BUN: 9 mg/dL (ref 6–20)
CO2: 29 mmol/L (ref 22–32)
Calcium: 9.2 mg/dL (ref 8.9–10.3)
Chloride: 106 mmol/L (ref 98–111)
Creatinine: 0.63 mg/dL (ref 0.44–1.00)
GFR, Estimated: >60 mL/min (ref >60)
Glucose, Bld: 106 mg/dL — ABNORMAL HIGH (ref 70–99)
Potassium: 4 mmol/L (ref 3.5–5.1)
Sodium: 141 mmol/L (ref 135–145)
Total Bilirubin: 0.4 mg/dL (ref 0.3–1.2)
Total Protein: 6.8 g/dL (ref 6.5–8.1)

## 2022-11-24 MED ORDER — DEXTROSE 5 % IV SOLN: Freq: Once | INTRAVENOUS | Status: AC

## 2022-11-24 MED ORDER — SODIUM CHLORIDE 0.9% FLUSH
10.0000 mL | INTRAVENOUS | Status: DC | PRN
  Administered 2022-11-24: 10 mL

## 2022-11-24 MED ORDER — HEPARIN SOD (PORK) LOCK FLUSH 100 UNIT/ML IV SOLN
500.0000 [IU] | Freq: Once | INTRAVENOUS | Status: AC | PRN
  Administered 2022-11-24: 500 [IU]

## 2022-11-24 MED ORDER — DIPHENHYDRAMINE HCL 25 MG PO CAPS
25.0000 mg | ORAL_CAPSULE | Freq: Once | ORAL | Status: AC
  Administered 2022-11-24: 25 mg via ORAL
  Filled 2022-11-24: qty 1

## 2022-11-24 MED ORDER — PALONOSETRON HCL INJECTION 0.25 MG/5ML
0.2500 mg | Freq: Once | INTRAVENOUS | Status: AC
  Administered 2022-11-24: 0.25 mg via INTRAVENOUS
  Filled 2022-11-24: qty 5

## 2022-11-24 MED ORDER — ACETAMINOPHEN 325 MG PO TABS
650.0000 mg | ORAL_TABLET | Freq: Once | ORAL | Status: AC
  Administered 2022-11-24: 650 mg via ORAL
  Filled 2022-11-24: qty 2

## 2022-11-24 MED ORDER — SODIUM CHLORIDE 0.9 % IV SOLN
150.0000 mg | Freq: Once | INTRAVENOUS | Status: AC
  Administered 2022-11-24: 150 mg via INTRAVENOUS
  Filled 2022-11-24: qty 150
  Filled 2022-11-24: qty 5

## 2022-11-24 MED ORDER — SODIUM CHLORIDE 0.9 % IV SOLN
10.0000 mg | Freq: Once | INTRAVENOUS | Status: AC
  Administered 2022-11-24: 10 mg via INTRAVENOUS
  Filled 2022-11-24: qty 10
  Filled 2022-11-24: qty 1

## 2022-11-24 MED ORDER — FAM-TRASTUZUMAB DERUXTECAN-NXKI CHEMO 100 MG IV SOLR
4.3000 mg/kg | Freq: Once | INTRAVENOUS | Status: AC
  Administered 2022-11-24: 400 mg via INTRAVENOUS
  Filled 2022-11-24: qty 20

## 2022-11-24 NOTE — Progress Notes (Signed)
Give 400 mg dose of EnHertu per MD Gudena.  Demetrius Charity, PharmD

## 2022-11-24 NOTE — Patient Instructions (Signed)
[facility]  Discharge Instructions: Thank you for choosing [facility] to provide your oncology and hematology care.   If you have a lab appointment with the Cancer Center, please go directly to the Cancer Center and check in at the registration area.   Wear comfortable clothing and clothing appropriate for easy access to any Portacath or PICC line.   We strive to give you quality time with your provider. You may need to reschedule your appointment if you arrive late (15 or more minutes).  Arriving late affects you and other patients whose appointments are after yours.  Also, if you miss three or more appointments without notifying the office, you may be dismissed from the clinic at the provider's discretion.      For prescription refill requests, have your pharmacy contact our office and allow 72 hours for refills to be completed.    Today you received the following chemotherapy and/or immunotherapy agents Enhertu.   To help prevent nausea and vomiting after your treatment, we encourage you to take your nausea medication as directed.  BELOW ARE SYMPTOMS THAT SHOULD BE REPORTED IMMEDIATELY: *FEVER GREATER THAN 100.4 F (38 C) OR HIGHER *CHILLS OR SWEATING *NAUSEA AND VOMITING THAT IS NOT CONTROLLED WITH YOUR NAUSEA MEDICATION *UNUSUAL SHORTNESS OF BREATH *UNUSUAL BRUISING OR BLEEDING *URINARY PROBLEMS (pain or burning when urinating, or frequent urination) *BOWEL PROBLEMS (unusual diarrhea, constipation, pain near the anus) TENDERNESS IN MOUTH AND THROAT WITH OR WITHOUT PRESENCE OF ULCERS (sore throat, sores in mouth, or a toothache) UNUSUAL RASH, SWELLING OR PAIN  UNUSUAL VAGINAL DISCHARGE OR ITCHING   Items with * indicate a potential emergency and should be followed up as soon as possible or go to the Emergency Department if any problems should occur.  Please show the CHEMOTHERAPY ALERT CARD or IMMUNOTHERAPY ALERT CARD at check-in  to the Emergency Department and triage nurse.  Should you have questions after your visit or need to cancel or reschedule your appointment, please contact [facility]  Dept: [phone]  and follow the prompts.  Office hours are 8:00 a.m. to 4:30 p.m. Monday - Friday. Please note that voicemails left after 4:00 p.m. may not be returned until the following business day.  We are closed weekends and major holidays. You have access to a nurse at all times for urgent questions. Please call the main number to the clinic Dept: [phone] and follow the prompts.   For any non-urgent questions, you may also contact your provider using MyChart. We now offer e-Visits for anyone 18 and older to request care online for non-urgent symptoms. For details visit [email or web address].   Also download the MyChart app! Go to the app store, search "MyChart", open the app, select [facility], and log in with your MyChart username and password.   

## 2022-11-24 NOTE — Progress Notes (Signed)
Radiation Oncology         ([phone]) [phone] ________________________________  Follow-up New Visit  Outpatient   Name: Krystal [name] MRN: [id number]  Date: 11/24/2022  DOB: 1966-05-05  XL:[id number], [name]  Barbaraann Cao Georgeanna Lea, MD   REFERRING PHYSICIAN: Henreitta Leber, MD  DIAGNOSIS:     ICD-10-CM   1. 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 Multifocal osseous metastatic disease diagnosed in 2021.   Narrative/Interval History 11/24/22: The patient returns today to further discuss the role of radiation therapy in management of her recently diagnosed metastases from a left breast cancer primary.   To review from her initial consultation, we discussed her candidacy for [facility]. However, given her signs of early leptomeningeal disease, she was adamant to avoid whole brain radiation if possible. She was recently started on Enhertu therapy at that time as well. In light of these factors, we agreed that it would be in her best interest that we pursue close observation with repeat imaging of the brain to make it more clear as to what type of radiation is really in her best interest. (She still has a follow-up MRI of the brain scheduled for this coming May).   On her consultation date, the patient was seen in consultation by Dr. Dione Booze (ophthalmology) later that day. Dr. Dione Booze discussed a referral to a retinal specialist like Dr. Fawn Kirk, however the patient was noted to be uninterested in this at the time. Per encounter notes, pictures of the fundus and optic nerve edema were recommended, however the patient left before these could be obtained. The patient contacted our office the following day and shared that she did not have a good experience at Dr. Laruth Bouchard office.  She felt as though the information obtained during her evaluation was not adequate. She has subsequently requested to be seen by another provider.  I read Dr. Laruth Bouchard notes and he favors that the process in her eye is neoplastic.  This is concordant with our radiologist's assessment from her MR imaging.  She acknowledges that she continues to have "flashes" in the superior field of vision in her left eye.   Of note: the patient recently had an echo performed on 11/21/22 which showed an EF of 55-60%.  HPI Initial Consultation 11/14/22 :Krystal [name] is a 57 y.o. female who presents today consideration of brain SRS in management of her recently diagnosed brain metastasis from a left breast cancer primary.   Metastatic left breast cancer history:   The patient underwent radiation therapy with Dr. Roselind Messier to the left breast in 2017. She later developed osseous metastatic disease to L5 and the left femur in 2021 and received palliative radiation therapy to both of these sites under Dr. Roselind Messier. Upon record review, the patient last followed up with Dr. Roselind Messier in March of 2021.  After completing radiation therapy, the patient continued with Ibrance, letrozole, and Xgeva for her bony metastatic disease under Dr. Pamelia Hoit.  The patient underwent genetic testing on 02/16/2021. 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 This puts the patient at a 34-80% lifetime risk of breast cancer and a 2.24% lifetime risk of ovarian cancer  She did have a CT CAP  performed on 04/04/21 which demonstrated an increase in size of a subcarinal lymph node measuring 1.2 cm (previously 6 mm) , a right lower paratracheal lymph node ,measuring 6 mm, interval enlargement of several lung nodules including a lingular nodule measuring 9 mm (previously 5 mm) and a left lower lobe lung nodule measuring 6 mm (previously 4 mm). CT otherwise showed stable bone  metastases involving the right lateral rib, L5, and S1. The patient ultimately opted to continue with Ibrance and follow-up imaging.   Follow-up CT CAP on 10/04/2021 demonstrated: progressive sclerotic metastatic disease most notably involving T10, L2, the left sacrum, and iliac bones; stable and enlarging mediastinal and right hilar nodes; a slight interval increase in size of the lingular and left lower lobe pulmonary nodules; a new small left lingular nodule; and a stable 13 mm left adrenal gland nodule. CT otherwise showed no findings suspicious for recurrent chest wall tumor, supraclavicular or axillary adenopathy, or hepatic metastatic disease.   Given her evidence of disease progression, the patient was switched from [place] to [employer] and [employer] in April 2023.   CT AP on 01/10/22 showed: an increase in density and size of the sclerotic skeletal metastasis in the lumbar spine and pelvis. CT otherwise showed no interval change of the bilateral pulmonary nodules, stable thickening of the left adrenal gland, no evidence of mediastinal lymphadenopathy, and no evidence of hepatic metastasis or peritoneal metastasis.   The patient continued to receive abemaciclib for her bony metastatic disease for the remainder of 2023 into 2024. However, due to issues with her job (presumably insurance issues) the patient was not able to have any more follow-up CT's in 2023.   She was finally able to have a CT CAP performed on 08/23/22 which unfortunately showed evidence of disease progression, demonstrated by: new and enlarged pulmonary nodules consistent with progressive pulmonary metastases; enlarging small low cervical and mediastinal lymph nodes suspicious for early nodal metastasis; mild progression of the multifocal sclerotic osseous metastatic disease; a new high right hepatic lobe (segment 7) 2.2 x 2.2 cm hypoattenuating lesion; a possible additional 4A/B hepatic lesion; hepatic steatosis; and an isolated  enlarging tiny omental nodule, suspicious for peritoneal metastasis.  Restaging PET scan on 09/07/22 again demonstrated disease progression, including the new nodal, pulmonary and hepatic metastases. PET also showed areas of variable FDG uptake with some new bone lesions and some areas of bony metastasis which appeared more focal, for instance in the pelvis where there was likely diffuse involvement on prior imaging. Many of the areas of known sclerosis did not show substantial uptake and some areas showed improvement / resolution of FDG uptake seen previously. However, definite active bony metastatic disease was demonstrated. PET also showed the previously demonstrated severe hepatic steatosis, and the presumed left adrenal adenoma.    Biopsy of a left supraclavicular lymph node on 09/22/22 showed metastatic carcinoma consistent with metastatic breast carcinoma. ER 90% positive with strong staining intensity; PR 0% negative; Proliferation marker Ki67 at 60%; Her2 status negative.   Dr. Pamelia Hoit recommends Caris molecular testing and consideration of the [place] 1 clinical trial. Since testing may take quite a while to result, Dr. Pamelia Hoit would like to start her on Enhertu.   Brain Metastasis:   The patient presented to Dr. Pamelia Hoit on 10/02/22 with complaints of left sided facial numbness through and around her nose, around her cheeks, and above her lips. She also endorsed having headaches and a lingering cough. In light of her complaints, Dr Pamelia Hoit recommended a stat  MRI of the brain. (Since her cough did not respond to OTC medication, she was prescribed azithromycin).   Subsequent MRI of the brain with and without contrast on 10/03/22 showed small foci of enhancement in the right central sulcus and within the anterior right frontal sulcus concerning for leptomeningeal metastatic disease. Skull metastases with associated dural tumor in the right frontal vertex region and left middle cranial fossa were also  appreciated, as well an abnormal enhancement in the left lobe concerning for metastatic disease, and innumerable indeterminate foci of chronic cerebral microhemorrhage (which can be seen with cerebral amyloid angiopathy).   Subsequently, the patient was referred to Dr. Barbaraann Cao on 10/09/22 for further management. During this visit, the patient detailed having migraine like headaches with aura for at least the past 4-5 years. She elaborated that this may have progressed in the recent weeks. She otherwise denied any other neurological deficits and reported that she is able to carry out her ADL's independently.   Dr. Barbaraann Cao ultimately recommended proceeding with Dr. Earmon Phoenix recommendation of Enhertu and [facility] which we will discuss in detail today. She began Enhertu on 10/11/22.   The patient was diagnosed with the flu on 03/13 which has complicated her treatment course. During a follow-up visit with medical oncology on 10/31/23, she reported struggling with a persistent cough that leads to vomiting and headaches. She also endorsed persistent fatigue. Her overall biggest complaint is her cough which is chronic, unchanged, and began a couple months prior to starting the Enhertu.   Her last echo was apparently completed in 2018.  In the setting of this, as well as her persistent fatigue, recent viral infection, and cardiomegaly, Enhertu will be held until she can have a repeat echocardiogram completed.   Dr. Barbaraann Cao also recommended a repeat brain MRI with 3T magnet and SRS protocol fine cuts for better characterization of CNS burden and tumor location (detailed below).   MRI of the brain with and without contrast on 11/06/22 shows  numerous new foci of faint enhancement, including in the inferior right cerebellar hemisphere, the mid left cerebellar hemisphere, along the posterior body of the right lateral ventricle, in the inferior right parietal lobe, in the left posterior frontal centrum semiovale, near the  hand motor region of the right precentral gyrus, in the subcortical white matter underlying the right middle frontal gyrus, and in the subcortical white matter anterior to the dominant lesion along the right central sulcus. MRI also demonstrates nonocclusive thrombus in the right distal transverse and sigmoid sinuses, extending into the internal jugular vein, and the previously demonstrated subchoroidal enhancement in the left globe, suspicious for metastasis.  Today, the patient states her headaches are left sided and controlled with Advil. Headaches are worsened with barometric pressure changes and certain odors. Patient also has noticed left sided vision changes. She describes these changes as "flashing orbs" and things look wavy like an "old 84s video." She reports left sided facial numbness that intermittently spreads to her teeth. She denies any extremity numbness, tingling, or muscle weakness.   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    PREVIOUS RADIATION THERAPY: Yes   2) Diagnosis:   Metastatic breast cancer with osseous metastases Treatment Intent: Palliative Radiation Treatment Dates: 08/21/2019 through 09/03/2019 Site Technique Total Dose (Gy) Dose per Fx (Gy) Completed Fx Beam Energies  Femur Left: Ext_Lt 3D 30/30 3 10/10 10X, 15X  Lumbar Spine: L5 3D 30/30 3 10/10  10X, 15X    1) Radiation Treatment Dates: 11/25/15 - 01/14/16:  1) Left chest wall treated to 45 Gy in 25 fractions  2) Scar boost treated to 18 Gy in 9 fractions     PAST MEDICAL HISTORY:  has a past medical history of Arthritis, BRCA1 positive, Cancer, Chest wall mass (09/2015), Complication of anesthesia, Cough with sputum (09/10/2015), Dental crowns present, Diabetes mellitus without complication, Family history of Lynch syndrome, Family history of pancreatic cancer, GERD (gastroesophageal reflux disease), Headache, History of breast cancer (2015),  and Stuffy and runny nose (09/10/2015).    PAST SURGICAL HISTORY: Past Surgical History:  Procedure Laterality Date   ABDOMINAL HYSTERECTOMY     BREAST LUMPECTOMY Left 09/16/2015   Procedure: LEFT BREAST MASS EXCISION;  Surgeon: [name], MD;  Location: [facility];  Service: General;  Laterality: Left;   BREAST RECONSTRUCTION WITH PLACEMENT OF TISSUE EXPANDER AND FLEX HD (ACELLULAR HYDRATED DERMIS) Bilateral 04/16/2014   Procedure: BILATERAL BREAST RECONSTRUCTION WITH PLACEMENT OF TISSUE EXPANDER AND FLEX HD (ACELLULAR HYDRATED DERMIS);  Surgeon: Glenna Fellows, MD;  Location: [facility];  Service: Plastics;  Laterality: Bilateral;   CESAREAN SECTION  1999; 2001; 2005   CHOLECYSTECTOMY  1995   COLONOSCOPY     DEBRIDEMENT AND CLOSURE WOUND Bilateral 05/15/2014   Procedure: DEBRIDEMENT AND CLOSURE OF BILATERAL MASECTOMY INCISIONS WITH BREAST EXPANSION;  Surgeon: Glenna Fellows, MD;  Location: [facility];  Service: Plastics;  Laterality: Bilateral;   LAPAROSCOPIC ASSISTED VAGINAL HYSTERECTOMY N/A 08/13/2014   Procedure: OPEN LAPAROSCOPY, EXPLORATORY LAPAROTOMY, TOTAL ABDOMINAL HYSTERECTOMY WITH BILATERAL SALPINGECTOMY AND BILATERAL OOPHORECTOMY;  Surgeon: Juluis Mire, MD;  Location: [facility];  Service: Gynecology;  Laterality: N/A;   LIPOSUCTION WITH LIPOFILLING Bilateral 11/13/2014   Procedure: LIPOFILLING TO BILATERAL CHEST ;  Surgeon: Glenna Fellows, MD;  Location: [facility];  Service: Plastics;  Laterality: Bilateral;   MELANOMA EXCISION N/A 04/16/2014   Procedure: WIDE LOCAL EXCISION OF BACK MELANOMA;  Surgeon: [name], MD;  Location: [facility];  Service: General;  Laterality: N/A;   PORT-A-CATH REMOVAL Right 06/14/2017   Procedure: REMOVAL PORT-A-CATH;  Surgeon: [name], MD;  Location: [facility];  Service: General;  Laterality: Right;   PORTACATH  PLACEMENT Right 10/21/2015   Procedure: INSERTION PORT-A-CATH WITH ULTRASOUND ;  Surgeon: [name], MD;  Location: [facility];  Service: General;  Laterality: Right;   PORTACATH PLACEMENT N/A 10/10/2022   Procedure: INSERTION PORT-A-CATH;  Surgeon: [name], MD;  Location: [facility];  Service: General;  Laterality: N/A;   REMOVAL OF BILATERAL TISSUE EXPANDERS WITH PLACEMENT OF BILATERAL BREAST IMPLANTS Bilateral 11/13/2014   Procedure: REMOVAL OF BILATERAL TISSUE EXPANDERS WITH PLACEMENT OF BILATERAL SILICONE IMPLANTS ;  Surgeon: Glenna Fellows, MD;  Location: [facility];  Service: Plastics;  Laterality: Bilateral;   SIMPLE MASTECTOMY WITH AXILLARY SENTINEL NODE BIOPSY Bilateral 04/16/2014   Procedure: BILATERAL SKIN SPARING  MASTECTOMIES WITH LEFT AXILLARY SENTINEL NODE BIOPSY;  Surgeon: [name], MD;  Location:  SURGERY CENTER;  Service: General;  Laterality: Bilateral;    FAMILY HISTORY: family history includes BRCA 1/2 in her cousin; Breast cancer in her paternal aunt and paternal grandmother; Heart attack in her maternal grandfather; Heart disease in her maternal grandmother; Other in her cousin and father; Pancreatic cancer (age of onset: 56) in her paternal uncle.  SOCIAL HISTORY:  reports that she quit smoking about 8 years ago. Her smoking  use included cigarettes. She has never used smokeless tobacco. She reports current alcohol use. She reports that she does not use drugs.  ALLERGIES: Dilaudid [hydromorphone] and Codeine  MEDICATIONS:  Current Outpatient Medications  Medication Sig Dispense Refill   aspirin EC 81 MG tablet Take 1 tablet (81 mg total) by mouth daily. Swallow whole. 90 tablet 3   aspirin-acetaminophen-caffeine (EXCEDRIN MIGRAINE) 250-250-65 MG tablet Take 2 tablets by mouth every 6 (six) hours as needed for headache.     CALCIUM PO Take 1,200 mg by mouth 2 (two) times daily.     dexamethasone (DECADRON) 4 MG  tablet Take 1 tablets (4 mg) by mouth daily for 3 days after chemotherapy. Take with food. 30 tablet 1   dicyclomine (BENTYL) 20 MG tablet Take 1 tablet (20 mg total) by mouth every 6 (six) hours as needed for up to 7 days for spasms. 28 tablet 0   esomeprazole (NEXIUM) 40 MG capsule Take 40 mg by mouth daily.     lidocaine-prilocaine (EMLA) cream Apply to affected area once 30 g 3   loperamide (IMODIUM) 2 MG capsule Take 2 mg by mouth daily as needed for diarrhea or loose stools. Take 2 tablets at first diarrhea and then 1 tablet after additional loose stool. Max number in 24 hours is 8 tablets.     loratadine (CLARITIN) 10 MG tablet Take 10 mg by mouth daily.     meloxicam (MOBIC) 15 MG tablet TAKE 1 TABLET(15 MG) BY MOUTH DAILY AS NEEDED FOR PAIN (Patient taking differently: Take 15 mg by mouth daily.) 30 tablet 1   ondansetron (ZOFRAN) 8 MG tablet Take 1 tablet (8 mg total) by mouth every 8 (eight) hours as needed for nausea or vomiting. Start on the third day after chemotherapy. 30 tablet 1   prochlorperazine (COMPAZINE) 10 MG tablet Take 1 tablet (10 mg total) by mouth every 6 (six) hours as needed for nausea or vomiting. 30 tablet 1   promethazine (PHENERGAN) 25 MG suppository Place 1 suppository (25 mg total) rectally every 6 (six) hours as needed for nausea or vomiting. 12 each 0   rosuvastatin (CRESTOR) 10 MG tablet Take 1 tablet (10 mg total) by mouth daily. 90 tablet 3   traMADol (ULTRAM) 50 MG tablet Take 1 tablet (50 mg total) by mouth every 6 (six) hours as needed. 10 tablet 0   traZODone (DESYREL) 50 MG tablet TAKE 1 TABLET(50 MG) BY MOUTH AT BEDTIME 30 tablet 11   venlafaxine XR (EFFEXOR-XR) 75 MG 24 hr capsule TAKE 1 CAPSULE(75 MG) BY MOUTH AT BEDTIME 90 capsule 3   No current facility-administered medications for this encounter.   Facility-Administered Medications Ordered in Other Encounters  Medication Dose Route Frequency Provider Last Rate Last Admin   sodium chloride flush  (NS) 0.9 % injection 10 mL  10 mL Intracatheter PRN Serena Croissant, MD   10 mL at 11/24/22 1500    REVIEW OF SYSTEMS:  As above.   PHYSICAL EXAM:  vitals were not taken for this visit.   General: Alert and oriented, in no acute distress    KPS = 90  100 - Normal; no complaints; no evidence of disease. 90   - Able to carry on normal activity; minor signs or symptoms of disease. 80   - Normal activity with effort; some signs or symptoms of disease. 69   - Cares for self; unable to carry on normal activity or to do active work. 60   - Requires occasional  assistance, but is able to care for most of his personal needs. 50   - Requires considerable assistance and frequent medical care. 40   - Disabled; requires special care and assistance. 30   - Severely disabled; hospital admission is indicated although death not imminent. 20   - Very sick; hospital admission necessary; active supportive treatment necessary. 10   - Moribund; fatal processes progressing rapidly. 0     - Dead  Karnofsky DA, Abelmann WH, Craver LS and [place] [phone]) The use of the nitrogen mustards in the palliative treatment of carcinoma: with particular reference to bronchogenic carcinoma Cancer 1 634-56   LABORATORY DATA:  Lab Results  Component Value Date   WBC 3.3 (L) 11/24/2022   HGB 12.2 11/24/2022   HCT 36.4 11/24/2022   MCV 97.6 11/24/2022   PLT 202 11/24/2022   CMP     Component Value Date/Time   NA 141 11/24/2022 1130   NA 142 02/16/2017 1110   K 4.0 11/24/2022 1130   K 4.3 02/16/2017 1110   CL 106 11/24/2022 1130   CO2 29 11/24/2022 1130   CO2 27 02/16/2017 1110   GLUCOSE 106 (H) 11/24/2022 1130   GLUCOSE 190 (H) 02/16/2017 1110   BUN 9 11/24/2022 1130   BUN 7.9 02/16/2017 1110   CREATININE 0.63 11/24/2022 1130   CREATININE 0.8 02/16/2017 1110   CALCIUM 9.2 11/24/2022 1130   CALCIUM 9.4 02/16/2017 1110   PROT 6.8 11/24/2022 1130   PROT 7.4 02/16/2017 1110   ALBUMIN 4.0 11/24/2022 1130    ALBUMIN 3.7 02/16/2017 1110   AST 43 (H) 11/24/2022 1130   AST 27 02/16/2017 1110   ALT 58 (H) 11/24/2022 1130   ALT 50 02/16/2017 1110   ALKPHOS 116 11/24/2022 1130   ALKPHOS 88 02/16/2017 1110   BILITOT 0.4 11/24/2022 1130   BILITOT 0.41 02/16/2017 1110   GFRNONAA >60 11/24/2022 1130   GFRAA >60 03/15/2020 0926         RADIOGRAPHY: ECHOCARDIOGRAM COMPLETE  Result Date: 11/21/2022    ECHOCARDIOGRAM REPORT   Patient Name:   Krystal [name] Date of Exam: 11/21/2022 Medical Rec #:  [id number]    Height:       64.0 in Accession #:    [id number]   Weight:       220.0 lb Date of Birth:  01/04/1966     BSA:          2.037 m Patient Age:    56 years     BP:           125/84 mmHg Patient Gender: F            HR:           83 bpm. Exam Location:  Outpatient Procedure: 2D Echo, Cardiac Doppler, Color Doppler and Strain Analysis Indications:    breast cancer; chemo  History:        Patient has prior history of Echocardiogram examinations, most                 recent 03/05/2017. Risk Factors:Diabetes.  Sonographer:    Wallie Char Referring Phys: Loa Socks  Sonographer Comments: Global longitudinal strain was attempted. IMPRESSIONS  1. Left ventricular ejection fraction, by estimation, is 55 to 60%. The left ventricle has normal function. The left ventricle has no regional wall motion abnormalities. Left ventricular diastolic parameters were normal. The average left ventricular global longitudinal strain is -18.0 %. The global longitudinal strain is normal.  2. Right ventricular systolic function is normal. The right ventricular size is normal.  3. The mitral valve is normal in structure. No evidence of mitral valve regurgitation. No evidence of mitral stenosis.  4. The aortic valve is tricuspid. Aortic valve regurgitation is trivial. No aortic stenosis is present.  5. Aortic dilatation noted. There is borderline dilatation of the aortic root, measuring 38 mm.  6. The inferior vena cava is normal in  size with greater than 50% respiratory variability, suggesting right atrial pressure of 3 mmHg. Comparison(s): No prior Echocardiogram. FINDINGS  Left Ventricle: Left ventricular ejection fraction, by estimation, is 55 to 60%. The left ventricle has normal function. The left ventricle has no regional wall motion abnormalities. The average left ventricular global longitudinal strain is -18.0 %. The global longitudinal strain is normal. The left ventricular internal cavity size was normal in size. There is no left ventricular hypertrophy. Left ventricular diastolic parameters were normal. Right Ventricle: The right ventricular size is normal. Right ventricular systolic function is normal. Left Atrium: Left atrial size was normal in size. Right Atrium: Right atrial size was normal in size. Pericardium: There is no evidence of pericardial effusion. Mitral Valve: The mitral valve is normal in structure. No evidence of mitral valve regurgitation. No evidence of mitral valve stenosis. MV peak gradient, 2.5 mmHg. The mean mitral valve gradient is 1.0 mmHg. Tricuspid Valve: The tricuspid valve is normal in structure. Tricuspid valve regurgitation is trivial. No evidence of tricuspid stenosis. Aortic Valve: The aortic valve is tricuspid. Aortic valve regurgitation is trivial. No aortic stenosis is present. Aortic valve mean gradient measures 4.0 mmHg. Aortic valve peak gradient measures 7.3 mmHg. Aortic valve area, by VTI measures 3.23 cm. Pulmonic Valve: The pulmonic valve was normal in structure. Pulmonic valve regurgitation is not visualized. No evidence of pulmonic stenosis. Aorta: Aortic dilatation noted. There is borderline dilatation of the aortic root, measuring 38 mm. Venous: The inferior vena cava is normal in size with greater than 50% respiratory variability, suggesting right atrial pressure of 3 mmHg. IAS/Shunts: No atrial level shunt detected by color flow Doppler.  LEFT VENTRICLE PLAX 2D LVIDd:         4.70 cm      Diastology LVIDs:         3.30 cm     LV e' medial:    7.50 cm/s LV PW:         0.90 cm     LV E/e' medial:  7.8 LV IVS:        0.70 cm     LV e' lateral:   10.40 cm/s LVOT diam:     2.10 cm     LV E/e' lateral: 5.6 LV SV:         84 LV SV Index:   41          2D Longitudinal Strain LVOT Area:     3.46 cm    2D Strain GLS Avg:     -18.0 %  LV Volumes (MOD) LV vol d, MOD A2C: 59.2 ml LV vol d, MOD A4C: 86.0 ml LV vol s, MOD A2C: 29.7 ml LV vol s, MOD A4C: 41.4 ml LV SV MOD A2C:     29.5 ml LV SV MOD A4C:     86.0 ml LV SV MOD BP:      39.6 ml RIGHT VENTRICLE             IVC RV Basal diam:  2.90 cm  IVC diam: 1.40 cm RV Mid diam:    1.80 cm RV S prime:     12.10 cm/s TAPSE (M-mode): 2.0 cm LEFT ATRIUM             Index        RIGHT ATRIUM           Index LA diam:        3.50 cm 1.72 cm/m   RA Area:     12.80 cm LA Vol (A2C):   22.4 ml 11.00 ml/m  RA Volume:   25.70 ml  12.62 ml/m LA Vol (A4C):   37.9 ml 18.60 ml/m LA Biplane Vol: 29.5 ml 14.48 ml/m  AORTIC VALVE                    PULMONIC VALVE AV Area (Vmax):    3.13 cm     PV Vmax:       0.75 m/s AV Area (Vmean):   3.16 cm     PV Peak grad:  2.3 mmHg AV Area (VTI):     3.23 cm AV Vmax:           135.50 cm/s AV Vmean:          96.950 cm/s AV VTI:            0.262 m AV Peak Grad:      7.3 mmHg AV Mean Grad:      4.0 mmHg LVOT Vmax:         122.50 cm/s LVOT Vmean:        88.450 cm/s LVOT VTI:          0.244 m LVOT/AV VTI ratio: 0.93  AORTA Ao Root diam: 3.80 cm Ao Asc diam:  3.30 cm MITRAL VALVE MV Area (PHT): 2.95 cm    SHUNTS MV Area VTI:   4.52 cm    Systemic VTI:  0.24 m MV Peak grad:  2.5 mmHg    Systemic Diam: 2.10 cm MV Mean grad:  1.0 mmHg MV Vmax:       0.78 m/s MV Vmean:      47.1 cm/s MV Decel Time: 257 msec MV E velocity: 58.60 cm/s MV A velocity: 73.10 cm/s MV E/A ratio:  0.80 Olga Millers MD Electronically signed by Olga Millers MD Signature Date/Time: 11/21/2022/4:13:27 PM    Final    MR BRAIN W WO CONTRAST  Result Date:  11/08/2022 CLINICAL DATA:  Brain/CNS neoplasm, assess treatment response. Facial numbness since January 2024. History of breast cancer with metastases to bone. EXAM: MRI HEAD WITHOUT AND WITH CONTRAST TECHNIQUE: Multiplanar, multiecho pulse sequences of the brain and surrounding structures were obtained without and with intravenous contrast. CONTRAST:  10 mL Vueway. COMPARISON:  MRI brain 10/03/2022. FINDINGS: BRAIN New Lesions: Numerous new foci of faint enhancement, including in the inferior right cerebellar hemisphere (image 25 series 13), the mid left cerebellar hemisphere (image 41 series 13), along the posterior body of the right lateral ventricle (image 104 series 13), in the inferior right parietal lobe (image 105 series 13), in the left posterior frontal centrum semiovale (image 120 series 13), near the hand motor region of the right precentral gyrus (image 122 series 13), in the subcortical white matter underlying the right middle frontal gyrus (image 123 series 13), and in the subcortical white matter anterior to the dominant lesion along the right central sulcus (image 131 series 13). Larger lesions: None. Stable or Smaller lesions: *Unchanged 8 mm focus of  enhancement along the medial aspect of the right central sulcus (image 132 series 14). *Unchanged 6 mm focus of enhancement along a minor sulcus in the anterior right frontal lobe (image 109 series 14). *Unchanged dural-based lesions overlying skull metastases in the left middle cranial fossa (image 53 series 14) and overlying the right superior frontal gyrus (image 33 series 15). Other Brain findings: No acute infarct or hemorrhage. No mass effect or midline shift. No hydrocephalus or extra-axial collection. Vascular: Nonocclusive thrombus in the right distal transverse and sigmoid sinuses, extending into the internal jugular vein (reference, images 45 and 21 of series 14). Skull and upper cervical spine: Unchanged marrow lesions, as above.  Sinuses/Orbits: Redemonstrated subchoroidal enhancement in the left globe, suspicious for metastasis (image 56 series 13). Other: None. IMPRESSION: 1. Numerous new foci of faint enhancement throughout the brain, highly suspicious for metastatic disease. 2. Nonocclusive thrombus in the right distal transverse and sigmoid sinuses, extending into the internal jugular vein. 3. Redemonstrated subchoroidal enhancement in the left globe, suspicious for metastasis. These results will be called to the ordering clinician or representative by the Radiologist Assistant, and communication documented in the PACS or [employer]. Electronically Signed   By: Orvan Falconer M.D.   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 Multifocal osseous metastatic disease diagnosed in 2021.   This is a very pleasant 57 year old female with metastatic disease to the brain.  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  We talked about the pros and cons of proceeding with external beam radiation to the posterior left eye, specifically focusing on the gross disease that is likely affecting her vision.  She understands there are no guarantees to treatment and that if she did undergo treatment we would start a 2-week regimen in which she would get treated 5 days a week.  I would design the radiation fields in such a way that it would still be possible for her to undergo whole brain radiation in the future if needed and/or stereotactic radiosurgery to the brain if needed.  She understands that side effects may include but not necessarily be limited to watering of the eye, fatigue, cataracts , brain  injury, nerve injury, hair loss, loss of vision.  In my opinion, she is more likely to lose her vision if treatment is delayed than have a serious injury from the radiation.  My recommendation is that we proceed with radiation as soon as reasonably possible to preserve the vision that she still has.  It is possible that the systemic therapy that she is receiving could shrink down her disease and if she wants to wait to undergo radiation therapy we will reassess when she gets another MRI in late May.  We also discussed that if she decides not to undergo radiation to her eye at this time but then develops rapidly progressive symptoms, she should call me so that we can try to expedite radiation.  She is going to think about her options.  She will call us next week with her decision.  Consent form was signed and placed in her chart in case she does decide to undergo radiation therapy to her left eye.  On date  of service, in total, I spent 46 minutes on this encounter. Patient was seen in person.  __________________________________________   Lonie Peak, MD  This document serves as a record of services personally performed by Lonie Peak, MD. It was created on her behalf by Neena Rhymes, a trained medical scribe. The creation of this record is based on the scribe's personal observations and the provider's statements to them. This document has been checked and approved by the attending provider.

## 2022-11-24 NOTE — Assessment & Plan Note (Signed)
08/22/2019: PET CT scan: Metastatic disease in the chest with multiple lung nodules all subcentimeter size and hypermetabolic lymph nodes (right paratracheal node 7 mm, subcarinal node 1 cm, right hilar node 6 mm), retroperitoneal and upper pelvic common iliac lymph nodes, bone metastases especially L5, L4, T6, right 10th rib, left proximal femur   Bone biopsy was not felt to be reliable in addition to the effect of radiation on the bone. Palliative radiation completed 09/03/2019 [facility]: BRCA1 mutation, TMB 5.36, MSI: Normal   Prior treatment: Ibrance with letrozole and Xgeva for bone metastases switched to abemaciclib and Fulvestrant   Bone metastasis: Patient does not want to take bisphosphonates CT CAP 08/23/2022: New and enlarged pulmonary nodule consistent with progression.  Enlarged small low cervical and mediastinal nodes.  Multiple bone metastases minimally progressive.  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  Skull metastases, abnormal enhancement left globe  ---------------------------------------------------------------- Current treatment:: Enhertu cycle 2 Enhertu toxicities:   Return to clinic in 3 weeks for cycle 3

## 2022-11-29 ENCOUNTER — Other Ambulatory Visit: Payer: Self-pay | Admitting: Hematology and Oncology

## 2022-12-09 NOTE — Progress Notes (Signed)
Patient Care Team: Patient, [name] [name], MD as Medical Oncologist (Hematology and Oncology) [name], MD as Consulting Physician (Radiation Oncology) [name], NP as Nurse Practitioner (Hematology and Oncology) Rolm Bookbinder, MD as Consulting Physician (General Surgery) [name], RPH-CPP as Pharmacist (Hematology and Oncology)  DIAGNOSIS: No diagnosis found.  SUMMARY OF ONCOLOGIC HISTORY: Oncology History  Cancer of central portion of left female breast ([place])  04/02/2014 Genetic Testing   BRCA1 [id number] pathogenic mutation identified on gene sequce and del/dup analyses of BRCA1 and BRCA2.  BRCA1 [id number] VUS identified as well.  The report date is 04/02/2014.  UPDATE: BRCA1 [id number] VUS has been reclassified as a likely benign variant.  The reclassification date is 04/21/2020.   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 N0 M0 stage IA: Oncotype DX 13 (ROR 9%)   04/16/2014 Surgery   Left upper back melanoma resected no residual melanoma identified on re-resection margins negative   05/26/2014 - 06/08/2015 Anti-estrogen oral therapy   Tamoxifen 20 mg daily with a plan to switch her to aromatase inhibitors once she gets oophorectomy ( patient did not continue antiestrogen therapy by choice)   08/13/2014 Surgery   oophorectomy with total abdominal hysterectomy   09/08/2015 -  Anti-estrogen oral therapy   Resumed anastrozole after she found recurrence of breast cancer; stopped in January 2018, started letrozole 2.5 mg daily 10/12/2016; switched to Exemestane 25 mg daily on 01/03/17   09/16/2015 Surgery   Skin, left: IDC grade 3; 0.5 cm, margins negative, ER 95%, PR 10%, HER-2 positive ratio 2.08   11/08/2015 - 02/2017 Chemotherapy    Herceptin every 3 weeks plus anastrozole   11/24/2015 - 01/14/2016 Radiation Therapy   Adjuvant radiation therapy by Dr. Sondra Come   01/2017 -  Anti-estrogen oral therapy   Exemestane daily (stopped others due to side effects), due to muscle aches and pains switch to tamoxifen 5 mg daily starting 12/18/2017   08/07/2019 Relapse/Recurrence   Bone scan: Sclerotic metastatic lesions involving left femoral neck, right ninth rib and entire L5 vertebra; MRI left hip: Diffuse signal abnormality L5 vertebral body with possible extraosseous extension   08/22/2019 - 09/03/2019 Radiation Therapy   Palliative radiation with Dr. Sondra Come to L5 and left proixmal femur.    10/2021 Treatment Plan Change   Fulvestrant and Verzenio   08/23/2022 Imaging   CT CAP 08/23/2022: New and enlarged pulmonary nodule consistent with progression.  Enlarged small low cervical and mediastinal nodes.  Multiple bone metastases minimally progressive.  Liver lesions evaluation is challenging because of hepatic steatosis with suspected liver progression, isolated tiny omental nodule     09/07/2022 PET scan   IMPRESSION: 1. Worsening of disease now with nodal, pulmonary and hepatic metastases. 2. Areas of variable FDG uptake with some new bone lesions and some areas of bony metastasis which are more focal, for instance in the pelvis where there was likely diffuse involvement on prior imaging. Many of the areas of sclerosis do not show substantial uptake as outlined above and some areas show improvement/resolution of FDG uptake seen previously but there are signs of active bony metastatic disease currently. 3. Likely LEFT adrenal adenoma. 4. Severe hepatic steatosis. 5. Aortic atherosclerosis.   Primary malignant neoplasm of breast with metastasis ([place])  08/25/2019 Initial Diagnosis   Primary  malignant neoplasm of breast with metastasis ([facility])   10/11/2022 -  Chemotherapy   Patient is on Treatment Plan : BREAST METASTATIC  Fam-Trastuzumab Deruxtecan-nxki (Enhertu) (5.4) q21d       CHIEF COMPLIANT:   INTERVAL HISTORY: Krystal [name] is a   ALLERGIES:  is allergic to dilaudid [hydromorphone] and codeine.  MEDICATIONS:  Current Outpatient Medications  Medication Sig Dispense Refill   aspirin EC 81 MG tablet Take 1 tablet (81 mg total) by mouth daily. Swallow whole. 90 tablet 3   aspirin-acetaminophen-caffeine (EXCEDRIN MIGRAINE) 250-250-65 MG tablet Take 2 tablets by mouth every 6 (six) hours as needed for headache.     CALCIUM PO Take 1,200 mg by mouth 2 (two) times daily.     dexamethasone (DECADRON) 4 MG tablet Take 1 tablets (4 mg) by mouth daily for 3 days after chemotherapy. Take with food. 30 tablet 1   dicyclomine (BENTYL) 20 MG tablet Take 1 tablet (20 mg total) by mouth every 6 (six) hours as needed for up to 7 days for spasms. 28 tablet 0   esomeprazole (NEXIUM) 40 MG capsule Take 40 mg by mouth daily.     lidocaine-prilocaine (EMLA) cream Apply to affected area once 30 g 3   loperamide (IMODIUM) 2 MG capsule Take 2 mg by mouth daily as needed for diarrhea or loose stools. Take 2 tablets at first diarrhea and then 1 tablet after additional loose stool. Max number in 24 hours is 8 tablets.     loratadine (CLARITIN) 10 MG tablet Take 10 mg by mouth daily.     meloxicam (MOBIC) 15 MG tablet TAKE 1 TABLET(15 MG) BY MOUTH DAILY AS NEEDED FOR PAIN (Patient taking differently: Take 15 mg by mouth daily.) 30 tablet 1   ondansetron (ZOFRAN) 8 MG tablet Take 1 tablet (8 mg total) by mouth every 8 (eight) hours as needed for nausea or vomiting. Start on the third day after chemotherapy. 30 tablet 1   prochlorperazine (COMPAZINE) 10 MG tablet Take 1 tablet (10 mg total) by mouth every 6 (six) hours as needed for nausea or vomiting. 30 tablet 1   promethazine (PHENERGAN) 25 MG suppository Place 1 suppository (25 mg total) rectally every 6 (six) hours as needed for nausea or vomiting. 12 each 0   rosuvastatin  (CRESTOR) 10 MG tablet Take 1 tablet (10 mg total) by mouth daily. 90 tablet 3   traMADol (ULTRAM) 50 MG tablet Take 1 tablet (50 mg total) by mouth every 6 (six) hours as needed. 10 tablet 0   traZODone (DESYREL) 50 MG tablet TAKE 1 TABLET(50 MG) BY MOUTH AT BEDTIME 90 tablet 0   venlafaxine XR (EFFEXOR-XR) 75 MG 24 hr capsule TAKE 1 CAPSULE(75 MG) BY MOUTH AT BEDTIME 90 capsule 3   No current facility-administered medications for this visit.    PHYSICAL EXAMINATION: ECOG PERFORMANCE STATUS: {CHL ONC ECOG PS:[id number]}  There were no vitals filed for this visit. There were no vitals filed for this visit.  BREAST:*** No palpable masses or nodules in either right or left breasts. No palpable axillary supraclavicular or infraclavicular adenopathy no breast tenderness or nipple discharge. (exam performed in the presence of a chaperone)  LABORATORY DATA:  I have reviewed the data as listed    Latest Ref Rng & Units 11/24/2022   11:30 AM 10/31/2022   11:09 AM 10/18/2022   11:53 AM  CMP  Glucose 70 - 99 mg/dL 478  295  621   BUN 6 - 20 mg/dL 9  6  16   Creatinine 0.44 - 1.00 mg/dL 1.61  0.96  0.45   Sodium 135 - 145 mmol/L 141  143  137   Potassium 3.5 - 5.1 mmol/L 4.0  3.6  3.3   Chloride 98 - 111 mmol/L 106  104  99   CO2 22 - 32 mmol/L 29  33  26   Calcium 8.9 - 10.3 mg/dL 9.2  8.5  9.2   Total Protein 6.5 - 8.1 g/dL 6.8  5.9  7.4   Total Bilirubin 0.3 - 1.2 mg/dL 0.4  0.4  0.7   Alkaline Phos 38 - 126 U/L 116  104  113   AST 15 - 41 U/L 43  19  36   ALT 0 - 44 U/L 58  19  54     Lab Results  Component Value Date   WBC 3.3 (L) 11/24/2022   HGB 12.2 11/24/2022   HCT 36.4 11/24/2022   MCV 97.6 11/24/2022   PLT 202 11/24/2022   NEUTROABS 1.7 11/24/2022    ASSESSMENT & PLAN:  No problem-specific Assessment & Plan notes found for this encounter.    No orders of the defined types were placed in this encounter.  The patient has a good understanding of the overall plan. she  agrees with it. she will call with any problems that may develop before the next visit here. Total time spent: 30 mins including face to face time and time spent for planning, charting and co-ordination of care   Sherlyn Lick, CMA 12/09/22    I Janan Ridge am acting as a Neurosurgeon for [employer]  ***

## 2022-12-11 ENCOUNTER — Telehealth: Payer: Self-pay | Admitting: *Deleted

## 2022-12-11 NOTE — Telephone Encounter (Signed)
Connected with Olga Coaster, [phone] (home) regarding FMLA leave type.  Confirmed intermittent LOA.   Form to provider for review and signature.  Successfully returned via fax number provided by patient.  Sent attention Gina.  Envelope to registration alphabetical file folder for patient pick up.  Copy to H.I.M. bin designated for items to be scanned.  No further actions performed or required by this nurse.

## 2022-12-13 MED FILL — Dexamethasone Sodium Phosphate Inj 100 MG/10ML: INTRAMUSCULAR | Qty: 1 | Status: AC

## 2022-12-13 MED FILL — Fosaprepitant Dimeglumine For IV Infusion 150 MG (Base Eq): INTRAVENOUS | Qty: 5 | Status: AC

## 2022-12-14 ENCOUNTER — Inpatient Hospital Stay: Payer: Commercial Managed Care - PPO

## 2022-12-14 ENCOUNTER — Inpatient Hospital Stay (HOSPITAL_BASED_OUTPATIENT_CLINIC_OR_DEPARTMENT_OTHER): Payer: Commercial Managed Care - PPO | Admitting: Hematology and Oncology

## 2022-12-14 ENCOUNTER — Other Ambulatory Visit: Payer: Self-pay

## 2022-12-14 ENCOUNTER — Inpatient Hospital Stay: Payer: Commercial Managed Care - PPO | Attending: Hematology and Oncology

## 2022-12-14 VITALS — BP 132/68 | HR 69 | Temp 97.5°F | Resp 17 | Ht 64.0 in | Wt 223.2 lb

## 2022-12-14 VITALS — BP 119/69 | HR 76 | Resp 18

## 2022-12-14 DIAGNOSIS — R5383 Other fatigue: Secondary | ICD-10-CM | POA: Insufficient documentation

## 2022-12-14 DIAGNOSIS — Z923 Personal history of irradiation: Secondary | ICD-10-CM | POA: Insufficient documentation

## 2022-12-14 DIAGNOSIS — Z791 Long term (current) use of non-steroidal anti-inflammatories (NSAID): Secondary | ICD-10-CM | POA: Diagnosis not present

## 2022-12-14 DIAGNOSIS — K760 Fatty (change of) liver, not elsewhere classified: Secondary | ICD-10-CM | POA: Insufficient documentation

## 2022-12-14 DIAGNOSIS — D7219 Other eosinophilia: Secondary | ICD-10-CM

## 2022-12-14 DIAGNOSIS — J111 Influenza due to unidentified influenza virus with other respiratory manifestations: Secondary | ICD-10-CM | POA: Insufficient documentation

## 2022-12-14 DIAGNOSIS — Z9221 Personal history of antineoplastic chemotherapy: Secondary | ICD-10-CM | POA: Diagnosis not present

## 2022-12-14 DIAGNOSIS — Z7982 Long term (current) use of aspirin: Secondary | ICD-10-CM | POA: Insufficient documentation

## 2022-12-14 DIAGNOSIS — Z95828 Presence of other vascular implants and grafts: Secondary | ICD-10-CM

## 2022-12-14 DIAGNOSIS — Z170 Estrogen receptor positive status [ER+]: Secondary | ICD-10-CM | POA: Insufficient documentation

## 2022-12-14 DIAGNOSIS — C50919 Malignant neoplasm of unspecified site of unspecified female breast: Secondary | ICD-10-CM

## 2022-12-14 DIAGNOSIS — Z79899 Other long term (current) drug therapy: Secondary | ICD-10-CM | POA: Diagnosis not present

## 2022-12-14 DIAGNOSIS — C7951 Secondary malignant neoplasm of bone: Secondary | ICD-10-CM | POA: Insufficient documentation

## 2022-12-14 DIAGNOSIS — Z7952 Long term (current) use of systemic steroids: Secondary | ICD-10-CM | POA: Diagnosis not present

## 2022-12-14 DIAGNOSIS — R053 Chronic cough: Secondary | ICD-10-CM

## 2022-12-14 DIAGNOSIS — C50112 Malignant neoplasm of central portion of left female breast: Secondary | ICD-10-CM | POA: Insufficient documentation

## 2022-12-14 DIAGNOSIS — I700 Atherosclerosis of aorta: Secondary | ICD-10-CM | POA: Diagnosis not present

## 2022-12-14 DIAGNOSIS — Z79811 Long term (current) use of aromatase inhibitors: Secondary | ICD-10-CM | POA: Insufficient documentation

## 2022-12-14 LAB — CMP (CANCER CENTER ONLY)
ALT: 37 U/L (ref 0–44)
AST: 26 U/L (ref 15–41)
Albumin: 3.8 g/dL (ref 3.5–5.0)
Alkaline Phosphatase: 101 U/L (ref 38–126)
Anion gap: 6 (ref 5–15)
BUN: 6 mg/dL (ref 6–20)
CO2: 29 mmol/L (ref 22–32)
Calcium: 8.5 mg/dL — ABNORMAL LOW (ref 8.9–10.3)
Chloride: 107 mmol/L (ref 98–111)
Creatinine: 0.64 mg/dL (ref 0.44–1.00)
GFR, Estimated: >60 mL/min (ref >60)
Glucose, Bld: 127 mg/dL — ABNORMAL HIGH (ref 70–99)
Potassium: 3.9 mmol/L (ref 3.5–5.1)
Sodium: 142 mmol/L (ref 135–145)
Total Bilirubin: 0.3 mg/dL (ref 0.3–1.2)
Total Protein: 6.4 g/dL — ABNORMAL LOW (ref 6.5–8.1)

## 2022-12-14 LAB — CBC WITH DIFFERENTIAL (CANCER CENTER ONLY)
Abs Immature Granulocytes: 0 10*3/uL (ref 0.00–0.07)
Basophils Absolute: 0 10*3/uL (ref 0.0–0.1)
Basophils Relative: 1 %
Eosinophils Absolute: 0.2 10*3/uL (ref 0.0–0.5)
Eosinophils Relative: 8 %
HCT: 35.5 % — ABNORMAL LOW (ref 36.0–46.0)
Hemoglobin: 11.8 g/dL — ABNORMAL LOW (ref 12.0–15.0)
Immature Granulocytes: 0 %
Lymphocytes Relative: 37 %
Lymphs Abs: 1 10*3/uL (ref 0.7–4.0)
MCH: 32 pg (ref 26.0–34.0)
MCHC: 33.2 g/dL (ref 30.0–36.0)
MCV: 96.2 fL (ref 80.0–100.0)
Monocytes Absolute: 0.3 10*3/uL (ref 0.1–1.0)
Monocytes Relative: 11 %
Neutro Abs: 1.1 10*3/uL — ABNORMAL LOW (ref 1.7–7.7)
Neutrophils Relative %: 43 %
Platelet Count: 273 10*3/uL (ref 150–400)
RBC: 3.69 MIL/uL — ABNORMAL LOW (ref 3.87–5.11)
RDW: 13.4 % (ref 11.5–15.5)
WBC Count: 2.6 10*3/uL — ABNORMAL LOW (ref 4.0–10.5)
nRBC: 0 % (ref 0.0–0.2)

## 2022-12-14 MED ORDER — HEPARIN SOD (PORK) LOCK FLUSH 100 UNIT/ML IV SOLN
500.0000 [IU] | Freq: Once | INTRAVENOUS | Status: AC | PRN
  Administered 2022-12-14: 500 [IU]

## 2022-12-14 MED ORDER — SODIUM CHLORIDE 0.9 % IV SOLN
150.0000 mg | Freq: Once | INTRAVENOUS | Status: AC
  Administered 2022-12-14: 150 mg via INTRAVENOUS
  Filled 2022-12-14: qty 150

## 2022-12-14 MED ORDER — FAM-TRASTUZUMAB DERUXTECAN-NXKI CHEMO 100 MG IV SOLR
3.2000 mg/kg | Freq: Once | INTRAVENOUS | Status: AC
  Administered 2022-12-14: 300 mg via INTRAVENOUS
  Filled 2022-12-14: qty 15

## 2022-12-14 MED ORDER — PALONOSETRON HCL INJECTION 0.25 MG/5ML
0.2500 mg | Freq: Once | INTRAVENOUS | Status: AC
  Administered 2022-12-14: 0.25 mg via INTRAVENOUS
  Filled 2022-12-14: qty 5

## 2022-12-14 MED ORDER — SODIUM CHLORIDE 0.9 % IV SOLN
10.0000 mg | Freq: Once | INTRAVENOUS | Status: AC
  Administered 2022-12-14: 10 mg via INTRAVENOUS
  Filled 2022-12-14: qty 10

## 2022-12-14 MED ORDER — SODIUM CHLORIDE 0.9% FLUSH
10.0000 mL | INTRAVENOUS | Status: DC | PRN
  Administered 2022-12-14: 10 mL

## 2022-12-14 MED ORDER — DIPHENHYDRAMINE HCL 25 MG PO CAPS
25.0000 mg | ORAL_CAPSULE | Freq: Once | ORAL | Status: AC
  Administered 2022-12-14: 25 mg via ORAL
  Filled 2022-12-14: qty 1

## 2022-12-14 MED ORDER — DEXTROSE 5 % IV SOLN: Freq: Once | INTRAVENOUS | Status: AC

## 2022-12-14 MED ORDER — ACETAMINOPHEN 325 MG PO TABS
650.0000 mg | ORAL_TABLET | Freq: Once | ORAL | Status: AC
  Administered 2022-12-14: 650 mg via ORAL
  Filled 2022-12-14: qty 2

## 2022-12-14 MED ORDER — SODIUM CHLORIDE 0.9% FLUSH
10.0000 mL | Freq: Once | INTRAVENOUS | Status: AC
  Administered 2022-12-14: 10 mL

## 2022-12-14 NOTE — Patient Instructions (Signed)
[facility]  Discharge Instructions: Thank you for choosing [facility] to provide your oncology and hematology care.   If you have a lab appointment with the Cancer Center, please go directly to the Cancer Center and check in at the registration area.   Wear comfortable clothing and clothing appropriate for easy access to any Portacath or PICC line.   We strive to give you quality time with your provider. You may need to reschedule your appointment if you arrive late (15 or more minutes).  Arriving late affects you and other patients whose appointments are after yours.  Also, if you miss three or more appointments without notifying the office, you may be dismissed from the clinic at the provider's discretion.      For prescription refill requests, have your pharmacy contact our office and allow 72 hours for refills to be completed.    Today you received the following chemotherapy and/or immunotherapy agents Enhertu.   To help prevent nausea and vomiting after your treatment, we encourage you to take your nausea medication as directed.  BELOW ARE SYMPTOMS THAT SHOULD BE REPORTED IMMEDIATELY: *FEVER GREATER THAN 100.4 F (38 C) OR HIGHER *CHILLS OR SWEATING *NAUSEA AND VOMITING THAT IS NOT CONTROLLED WITH YOUR NAUSEA MEDICATION *UNUSUAL SHORTNESS OF BREATH *UNUSUAL BRUISING OR BLEEDING *URINARY PROBLEMS (pain or burning when urinating, or frequent urination) *BOWEL PROBLEMS (unusual diarrhea, constipation, pain near the anus) TENDERNESS IN MOUTH AND THROAT WITH OR WITHOUT PRESENCE OF ULCERS (sore throat, sores in mouth, or a toothache) UNUSUAL RASH, SWELLING OR PAIN  UNUSUAL VAGINAL DISCHARGE OR ITCHING   Items with * indicate a potential emergency and should be followed up as soon as possible or go to the Emergency Department if any problems should occur.  Please show the CHEMOTHERAPY ALERT CARD or IMMUNOTHERAPY ALERT CARD at check-in  to the Emergency Department and triage nurse.  Should you have questions after your visit or need to cancel or reschedule your appointment, please contact [facility]  Dept: [phone]  and follow the prompts.  Office hours are 8:00 a.m. to 4:30 p.m. Monday - Friday. Please note that voicemails left after 4:00 p.m. may not be returned until the following business day.  We are closed weekends and major holidays. You have access to a nurse at all times for urgent questions. Please call the main number to the clinic Dept: [phone] and follow the prompts.   For any non-urgent questions, you may also contact your provider using MyChart. We now offer e-Visits for anyone 18 and older to request care online for non-urgent symptoms. For details visit [email or web address].   Also download the MyChart app! Go to the app store, search "MyChart", open the app, select [facility], and log in with your MyChart username and password.   

## 2022-12-14 NOTE — Assessment & Plan Note (Addendum)
08/22/2019: PET CT scan: Metastatic disease in the chest with multiple lung nodules all subcentimeter size and hypermetabolic lymph nodes (right paratracheal node 7 mm, subcarinal node 1 cm, right hilar node 6 mm), retroperitoneal and upper pelvic common iliac lymph nodes, bone metastases especially L5, L4, T6, right 10th rib, left proximal femur   Bone biopsy was not felt to be reliable in addition to the effect of radiation on the bone. Palliative radiation completed 09/03/2019 [facility]: BRCA1 mutation, TMB 5.36, MSI: Normal   Prior treatment: Ibrance with letrozole and Xgeva for bone metastases switched to abemaciclib and Fulvestrant   Bone metastasis: Patient does not want to take bisphosphonates CT CAP 08/23/2022: New and enlarged pulmonary nodule consistent with progression.  Enlarged small low cervical and mediastinal nodes.  Multiple bone metastases minimally progressive.  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  Skull metastases, abnormal enhancement left globe  ---------------------------------------------------------------- Current treatment:: Enhertu cycle 3 Enhertu toxicities: Severe flu after the first cycle of chemo and she had nausea vomiting and lost 10 pounds. Fatigue ANC 1.1 today: I reduce the dosage of Enhertu and we are okay to proceed with today's treatment.   This restaging scans have been ordered for 12/29/2022 She also has a brain MRI scheduled before then and she is hoping that the tumor behind the eye has improved.  Clinically she has improvement in her eye symptoms.  Return to clinic in 3 weeks for cycle 4

## 2022-12-15 ENCOUNTER — Ambulatory Visit: Payer: Commercial Managed Care - PPO

## 2022-12-15 ENCOUNTER — Ambulatory Visit: Payer: Commercial Managed Care - PPO | Admitting: Hematology and Oncology

## 2022-12-15 ENCOUNTER — Other Ambulatory Visit: Payer: Commercial Managed Care - PPO

## 2022-12-18 LAB — IGE: IgE (Immunoglobulin E), Serum: 390 IU/mL (ref 6–495)

## 2022-12-19 ENCOUNTER — Encounter: Payer: Self-pay | Admitting: Hematology and Oncology

## 2022-12-19 NOTE — Progress Notes (Addendum)
Location/Histology of Brain Tumor:  12-29-22 MR Brain W Wo Contrast  IMPRESSION: 1. 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 No associated cerebral edema or mass effect.   2. Other scattered punctate foci of apparent brain enhancement on black blood postcontrast imaging is both unchanged since 11/06/2022 and frequently not correlated on the other postcontrast sequences. Furthermore, there are innumerable punctate microhemorrhages scattered in both cerebral hemispheres. At this point I favor vascular and/or chronic hemosiderin artifact over genuine punctate parenchymal brain metastases.   3. Virtually resolved left posterior globe choroid/retina metastasis since April.  NM PET Image Restag (PS) Skull Base To Thigh 09/07/2022  IMPRESSION: 1. Worsening of disease now with nodal, pulmonary and hepatic metastases. 2. Areas of variable FDG uptake with some new bone lesions and some areas of bony metastasis which are more focal, for instance in the pelvis where there was likely diffuse involvement on prior imaging. Many of the areas of sclerosis do not show substantial uptake as outlined above and some areas show improvement/resolution of FDG uptake seen previously but there are signs of active bony metastatic disease currently. 3. Likely LEFT adrenal adenoma. 4. Severe hepatic steatosis. 5. Aortic atherosclerosis.  MR Brain W Wo Contrast 10/03/2022  IMPRESSION: 1. 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 2. Skull metastases with associated dural tumor in the right frontal vertex region and left middle cranial fossa. 3. Abnormal enhancement in the left globe concerning for metastatic disease. 4. Innumerable foci of chronic cerebral microhemorrhage, indeterminate though can be seen with cerebral amyloid  angiopathy.  MR BRAIN W WO CONTRAST 11/06/2022  IMPRESSION: 1. Numerous new foci of faint enhancement throughout the brain, highly suspicious for metastatic disease. 2. Nonocclusive thrombus in the right distal transverse and sigmoid sinuses, extending into the internal jugular vein. 3. Redemonstrated subchoroidal enhancement in the left globe, suspicious for metastasis.  09/22/2022 Pathology FINAL MICROSCOPIC DIAGNOSIS:   A. 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 Liana Gerold note on 11-09-22) She continues to report "wavy" visual symptoms in her left eye. Still has the same left lower facial numbness. She also describes migraine headaches with aura, this has been presents for at least 4-5 years. This might be "a little worse" in recent weeks.   Past or anticipated interventions, if any, per neurosurgery: none in EMR  Past or anticipated interventions, if any, per medical oncology:  Dr. Pamelia Hoit on 12-14-22 Oncology History  Cancer of central portion of left female breast (HCC)  04/02/2014 Genetic Testing    BRCA1 [id number] pathogenic mutation identified on gene sequce and del/dup analyses of BRCA1 and BRCA2.  BRCA1 p.H1283R VUS identified as well.  The report date is 04/02/2014.  UPDATE: BRCA1 O.Z3086V VUS has been reclassified as a likely benign variant.  The reclassification date is 04/21/2020.    04/16/2014 Surgery    Bilateral mastectomies: Left breast : Multifocal invasive ductal carcinoma positive for lymphovascular invasion, 1.2 cm and 0.6 cm, grade 3, high-grade DCIS with comedonecrosis, 4 SLN negative T1 C. N0 M0 stage IA: Oncotype DX 13 (ROR 9%)    04/16/2014 Surgery    Left upper back melanoma resected no residual melanoma identified on re-resection margins negative    05/26/2014 - 06/08/2015 Anti-estrogen oral  therapy  Tamoxifen 20 mg daily with a plan to switch her to aromatase inhibitors once she gets oophorectomy ( patient did not continue antiestrogen therapy by choice)    08/13/2014 Surgery    oophorectomy with total abdominal hysterectomy    09/08/2015 -  Anti-estrogen oral therapy    Resumed anastrozole after she found recurrence of breast cancer; stopped in January 2018, started letrozole 2.5 mg daily 10/12/2016; switched to Exemestane 25 mg daily on 01/03/17    09/16/2015 Surgery    Skin, left: IDC grade 3; 0.5 cm, margins negative, ER 95%, PR 10%, HER-2 positive ratio 2.08    11/08/2015 - 02/2017 Chemotherapy    Herceptin every 3 weeks plus anastrozole    11/24/2015 - 01/14/2016 Radiation Therapy    Adjuvant radiation therapy by Dr. Roselind Messier    01/2017 -  Anti-estrogen oral therapy    Exemestane daily (stopped others due to side effects), due to muscle aches and pains switch to tamoxifen 5 mg daily starting 12/18/2017    08/07/2019 Relapse/Recurrence    Bone scan: Sclerotic metastatic lesions involving left femoral neck, right ninth rib and entire L5 vertebra; MRI left hip: Diffuse signal abnormality L5 vertebral body with possible extraosseous extension    08/22/2019 - 09/03/2019 Radiation Therapy    Palliative radiation with Dr. Roselind Messier to L5 and left proixmal femur.     10/2021 Treatment Plan Change    Fulvestrant and Verzenio    08/23/2022 Imaging    CT CAP 08/23/2022: New and enlarged pulmonary nodule consistent with progression.  Enlarged small low cervical and mediastinal nodes.  Multiple bone metastases minimally progressive.  Liver lesions evaluation is challenging because of hepatic steatosis with suspected liver progression, isolated tiny omental nodule      09/07/2022 PET scan    IMPRESSION: 1. Worsening of disease now with nodal, pulmonary and hepatic metastases. 2. Areas of variable FDG uptake with some new bone lesions and some areas of bony metastasis which are more focal, for  instance in the pelvis where there was likely diffuse involvement on prior imaging. Many of the areas of sclerosis do not show substantial uptake as outlined above and some areas show improvement/resolution of FDG uptake seen previously but there are signs of active bony metastatic disease currently. 3. Likely LEFT adrenal adenoma. 4. Severe hepatic steatosis. 5. Aortic atherosclerosis.    Primary malignant neoplasm of breast with metastasis (HCC)  08/25/2019 Initial Diagnosis    Primary malignant neoplasm of breast with metastasis (HCC)    10/11/2022 -  Chemotherapy    Patient is on Treatment Plan : BREAST METASTATIC Fam-Trastuzumab Deruxtecan-nxki (Enhertu) (5.4) q21d       ASSESSMENT & PLAN:  Cancer of central portion of left female breast (HCC) 08/22/2019: PET CT scan: Metastatic disease in the chest with multiple lung nodules all subcentimeter size and hypermetabolic lymph nodes (right paratracheal node 7 mm, subcarinal node 1 cm, right hilar node 6 mm), retroperitoneal and upper pelvic common iliac lymph nodes, bone metastases especially L5, L4, T6, right 10th rib, left proximal femur   Bone biopsy was not felt to be reliable in addition to the effect of radiation on the bone. Palliative radiation completed 09/03/2019 [facility]: BRCA1 mutation, TMB 5.36, MSI: Normal   Prior treatment: Ibrance with letrozole and Xgeva for bone metastases switched to abemaciclib and Fulvestrant   Bone metastasis: Patient does not want to take bisphosphonates CT CAP 08/23/2022: New and enlarged pulmonary nodule consistent with progression.  Enlarged small low cervical and mediastinal nodes.  Multiple bone metastases minimally progressive.  Liver lesions evaluation is challenging because of hepatic steatosis with suspected liver progression, isolated tiny omental nodule   Ultrasound left supraclavicular biopsy 09/22/2022: Metastatic breast cancer ER 90%, PR 0%, Ki-67 60%, HER2 1+ Brain MRI: 10/03/2022:  Small foci of enhancement in the right central sulcus and within an anterior right frontal sulcus concerning for leptomeningeal disease.  Skull metastases, abnormal enhancement left globe  ---------------------------------------------------------------- Current treatment:: Enhertu cycle 3 Enhertu toxicities: Severe flu after the first cycle of chemo and she had nausea vomiting and lost 10 pounds. Fatigue ANC 1.1 today: I reduce the dosage of Enhertu and we are okay to proceed with today's treatment.   This restaging scans have been ordered for 12/29/2022 She also has a brain MRI scheduled before then and she is hoping that the tumor behind the eye has improved.  Clinically she has improvement in her eye symptoms.   Return to clinic in 3 weeks for cycle 4  Dr. 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  Recent T3 MRI study demonstrates additional tiny foci of parenchymal enhancement c/w metastases.     We discussed whole brain radiation vs radiosurgery.  She would prefer SRS if amenable.  Case will be discussed in detail in upcoming CNS tumor board, including assessment of left orbital metastasis.     Referral will be placed for consultation with radiation oncology.   Will con't with Enhertu with Dr. Pamelia Hoit.   We appreciate the opportunity to participate in the care of Krystal [name].     We ask that Krystal [name] return to clinic in 3 months following radiation therapy, or sooner as needed.  Dose of Decadron, if applicable: yes, 4mg  with Chemo  Recent neurologic symptoms, if any:  Seizures: no Headaches: yes, not as many as before, worse with barometric changes Nausea: yes, nausea medications help when she takes them, not a on consistent nausea, can we worse with smells  Dizziness/ataxia: yes. 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 This led to her getting her diagnosis.   Painful bone metastases at present, if any: none at this time  SAFETY ISSUES: Prior radiation? Yes, in multiple areas with Dr. Roselind Messier,  Pacemaker/ICD? no Possible current pregnancy? no Is the patient on methotrexate? no  Additional Complaints / other details: Wants MRI results and wants to talk to neurosurgeon before making a decision about which type of radiation.

## 2022-12-26 ENCOUNTER — Other Ambulatory Visit: Payer: Self-pay

## 2022-12-27 ENCOUNTER — Other Ambulatory Visit: Payer: Self-pay | Admitting: Hematology and Oncology

## 2022-12-28 ENCOUNTER — Ambulatory Visit
Admission: RE | Admit: 2022-12-28 | Discharge: 2022-12-28 | Disposition: A | Discharge status: Home / Self Care | Payer: Commercial Managed Care - PPO | Source: Ambulatory Visit | Attending: Radiation Oncology | Admitting: Radiation Oncology

## 2022-12-28 DIAGNOSIS — C7931 Secondary malignant neoplasm of brain: Secondary | ICD-10-CM

## 2022-12-28 MED ORDER — HEPARIN SOD (PORK) LOCK FLUSH 100 UNIT/ML IV SOLN: 500.0000 [IU] | Freq: Once | INTRAVENOUS | Status: DC

## 2022-12-28 MED ORDER — SODIUM CHLORIDE 0.9% FLUSH: 10.0000 mL | INTRAVENOUS | Status: DC | PRN

## 2022-12-28 MED ORDER — GADOPICLENOL 0.5 MMOL/ML IV SOLN
10.0000 mL | Freq: Once | INTRAVENOUS | Status: AC | PRN
  Administered 2022-12-28: 10 mL via INTRAVENOUS

## 2022-12-29 ENCOUNTER — Telehealth: Payer: Self-pay

## 2022-12-29 ENCOUNTER — Ambulatory Visit (HOSPITAL_COMMUNITY)
Admission: RE | Admit: 2022-12-29 | Discharge: 2022-12-29 | Disposition: A | Discharge status: Home / Self Care | Payer: Commercial Managed Care - PPO | Source: Ambulatory Visit | Attending: Hematology and Oncology | Admitting: Hematology and Oncology

## 2022-12-29 ENCOUNTER — Encounter: Payer: Self-pay | Admitting: Radiation Oncology

## 2022-12-29 DIAGNOSIS — C50112 Malignant neoplasm of central portion of left female breast: Secondary | ICD-10-CM | POA: Insufficient documentation

## 2022-12-29 DIAGNOSIS — Z170 Estrogen receptor positive status [ER+]: Secondary | ICD-10-CM | POA: Insufficient documentation

## 2022-12-29 DIAGNOSIS — C50919 Malignant neoplasm of unspecified site of unspecified female breast: Secondary | ICD-10-CM | POA: Diagnosis present

## 2022-12-29 MED ORDER — SODIUM CHLORIDE (PF) 0.9 % IJ SOLN
INTRAMUSCULAR | Status: AC
  Filled 2022-12-29: qty 50

## 2022-12-29 MED ORDER — IOHEXOL 300 MG/ML  SOLN
100.0000 mL | Freq: Once | INTRAMUSCULAR | Status: AC | PRN
  Administered 2022-12-29: 100 mL via INTRAVENOUS

## 2022-12-29 NOTE — Progress Notes (Signed)
Patient Care Team: Patient, [name] Krystal Croissant, MD as Medical Oncologist (Hematology and Oncology) Krystal Blackbird, MD as Consulting Physician (Radiation Oncology) Krystal [name] Krystal Daughters, NP as Nurse Practitioner (Hematology and Oncology) Krystal Loron, MD as Consulting Physician (General Surgery) Krystal [name], RPH-CPP as Pharmacist (Hematology and Oncology)  DIAGNOSIS: No diagnosis found.  SUMMARY OF ONCOLOGIC HISTORY: Oncology History  Cancer of central portion of left female breast (HCC)  04/02/2014 Genetic Testing   BRCA1 [id number] pathogenic mutation identified on gene sequce and del/dup analyses of BRCA1 and BRCA2.  BRCA1 p.H1283R VUS identified as well.  The report date is 04/02/2014.  UPDATE: BRCA1 Z.O1096E VUS has been reclassified as a likely benign variant.  The reclassification date is 04/21/2020.   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 N0 M0 stage IA: Oncotype DX 13 (ROR 9%)   04/16/2014 Surgery   Left upper back melanoma resected no residual melanoma identified on re-resection margins negative   05/26/2014 - 06/08/2015 Anti-estrogen oral therapy   Tamoxifen 20 mg daily with a plan to switch her to aromatase inhibitors once she gets oophorectomy ( patient did not continue antiestrogen therapy by choice)   08/13/2014 Surgery   oophorectomy with total abdominal hysterectomy   09/08/2015 -  Anti-estrogen oral therapy   Resumed anastrozole after she found recurrence of breast cancer; stopped in January 2018, started letrozole 2.5 mg daily 10/12/2016; switched to Exemestane 25 mg daily on 01/03/17   09/16/2015 Surgery   Skin, left: IDC grade 3; 0.5 cm, margins negative, ER 95%, PR 10%, HER-2 positive ratio 2.08   11/08/2015 - 02/2017 Chemotherapy    Herceptin every 3 weeks plus anastrozole   11/24/2015 - 01/14/2016 Radiation Therapy   Adjuvant radiation therapy by Dr. Roselind [name]   01/2017 -  Anti-estrogen oral therapy   Exemestane daily (stopped others due to side effects), due to muscle aches and pains switch to tamoxifen 5 mg daily starting 12/18/2017   08/07/2019 Relapse/Recurrence   Bone scan: Sclerotic metastatic lesions involving left femoral neck, right ninth rib and entire L5 vertebra; MRI left hip: Diffuse signal abnormality L5 vertebral body with possible extraosseous extension   08/22/2019 - 09/03/2019 Radiation Therapy   Palliative radiation with Dr. Roselind [name] to L5 and left proixmal femur.    10/2021 Treatment Plan Change   Fulvestrant and Verzenio   08/23/2022 Imaging   CT CAP 08/23/2022: New and enlarged pulmonary nodule consistent with progression.  Enlarged small low cervical and mediastinal nodes.  Multiple bone metastases minimally progressive.  Liver lesions evaluation is challenging because of hepatic steatosis with suspected liver progression, isolated tiny omental nodule     09/07/2022 PET scan   IMPRESSION: 1. Worsening of disease now with nodal, pulmonary and hepatic metastases. 2. Areas of variable FDG uptake with some new bone lesions and some areas of bony metastasis which are more focal, for instance in the pelvis where there was likely diffuse involvement on prior imaging. Many of the areas of sclerosis do not show substantial uptake as outlined above and some areas show improvement/resolution of FDG uptake seen previously but there are signs of active bony metastatic disease currently. 3. Likely LEFT adrenal adenoma. 4. Severe hepatic steatosis. 5. Aortic atherosclerosis.   Primary malignant neoplasm of breast with metastasis (HCC)  08/25/2019 Initial Diagnosis   Primary  malignant neoplasm of breast with metastasis (HCC)   10/11/2022 -  Chemotherapy   Patient is on Treatment Plan : BREAST METASTATIC  Fam-Trastuzumab Deruxtecan-nxki (Enhertu) (5.4) q21d       CHIEF COMPLIANT:   INTERVAL HISTORY: Krystal [name] is a   ALLERGIES:  is allergic to dilaudid [hydromorphone] and codeine.  MEDICATIONS:  Current Outpatient Medications  Medication Sig Dispense Refill   aspirin EC 81 MG tablet Take 1 tablet (81 mg total) by mouth daily. Swallow whole. 90 tablet 3   aspirin-acetaminophen-caffeine (EXCEDRIN MIGRAINE) 250-250-65 MG tablet Take 2 tablets by mouth every 6 (six) hours as needed for headache.     CALCIUM PO Take 1,200 mg by mouth 2 (two) times daily.     dexamethasone (DECADRON) 4 MG tablet Take 1 tablets (4 mg) by mouth daily for 3 days after chemotherapy. Take with food. 30 tablet 1   dicyclomine (BENTYL) 20 MG tablet Take 1 tablet (20 mg total) by mouth every 6 (six) hours as needed for up to 7 days for spasms. 28 tablet 0   esomeprazole (NEXIUM) 40 MG capsule Take 40 mg by mouth daily.     lidocaine-prilocaine (EMLA) cream Apply to affected area once 30 g 3   loperamide (IMODIUM) 2 MG capsule Take 2 mg by mouth daily as needed for diarrhea or loose stools. Take 2 tablets at first diarrhea and then 1 tablet after additional loose stool. Max number in 24 hours is 8 tablets.     loratadine (CLARITIN) 10 MG tablet Take 10 mg by mouth daily.     meloxicam (MOBIC) 15 MG tablet TAKE 1 TABLET(15 MG) BY MOUTH DAILY AS NEEDED FOR PAIN 30 tablet 1   ondansetron (ZOFRAN) 8 MG tablet Take 1 tablet (8 mg total) by mouth every 8 (eight) hours as needed for nausea or vomiting. Start on the third day after chemotherapy. 30 tablet 1   prochlorperazine (COMPAZINE) 10 MG tablet Take 1 tablet (10 mg total) by mouth every 6 (six) hours as needed for nausea or vomiting. 30 tablet 1   promethazine (PHENERGAN) 25 MG suppository Place 1 suppository (25 mg total) rectally every 6 (six) hours as needed for nausea or vomiting. 12 each 0   rosuvastatin (CRESTOR) 10 MG tablet Take 1 tablet (10 mg total) by mouth  daily. 90 tablet 3   traMADol (ULTRAM) 50 MG tablet Take 1 tablet (50 mg total) by mouth every 6 (six) hours as needed. (Patient not taking: Reported on 12/29/2022) 10 tablet 0   traZODone (DESYREL) 50 MG tablet TAKE 1 TABLET(50 MG) BY MOUTH AT BEDTIME 90 tablet 0   venlafaxine XR (EFFEXOR-XR) 75 MG 24 hr capsule TAKE 1 CAPSULE(75 MG) BY MOUTH AT BEDTIME 90 capsule 3   No current facility-administered medications for this visit.    PHYSICAL EXAMINATION: ECOG PERFORMANCE STATUS: {CHL ONC ECOG [phone]}  There were no vitals filed for this visit. There were no vitals filed for this visit.  BREAST:*** No palpable masses or nodules in either right or left breasts. No palpable axillary supraclavicular or infraclavicular adenopathy no breast tenderness or nipple discharge. (exam performed in the presence of a chaperone)  LABORATORY DATA:  I have reviewed the data as listed    Latest Ref Rng & Units 12/14/2022    1:55 PM 11/24/2022   11:30 AM 10/31/2022   11:09 AM  CMP  Glucose 70 - 99 mg/dL 244  010  272   BUN 6 - 20 mg/dL 6  9  6   Creatinine 0.44 - 1.00 mg/dL 1.61  0.96  0.45   Sodium 135 - 145 mmol/L 142  141  143   Potassium 3.5 - 5.1 mmol/L 3.9  4.0  3.6   Chloride 98 - 111 mmol/L 107  106  104   CO2 22 - 32 mmol/L 29  29  33   Calcium 8.9 - 10.3 mg/dL 8.5  9.2  8.5   Total Protein 6.5 - 8.1 g/dL 6.4  6.8  5.9   Total Bilirubin 0.3 - 1.2 mg/dL 0.3  0.4  0.4   Alkaline Phos 38 - 126 U/L 101  116  104   AST 15 - 41 U/L 26  43  19   ALT 0 - 44 U/L 37  58  19     Lab Results  Component Value Date   WBC 2.6 (L) 12/14/2022   HGB 11.8 (L) 12/14/2022   HCT 35.5 (L) 12/14/2022   MCV 96.2 12/14/2022   PLT 273 12/14/2022   NEUTROABS 1.1 (L) 12/14/2022    ASSESSMENT & PLAN:  No problem-specific Assessment & Plan notes found for this encounter.    No orders of the defined types were placed in this encounter.  The patient has a good understanding of the overall plan. she  agrees with it. she will call with any problems that may develop before the next visit here. Total time spent: 30 mins including face to face time and time spent for planning, charting and co-ordination of care   Sherlyn Lick, CMA 12/29/22    I Janan Ridge am acting as a Neurosurgeon for [employer]  ***

## 2022-12-29 NOTE — Telephone Encounter (Signed)
Rn called pt to obtain meaningful use and nurse contact information. Folllow up new note completed and routed to Dr. Basilio Cairo and Quitman Livings PA.

## 2023-01-01 NOTE — Progress Notes (Signed)
Radiation Oncology         [phone] ________________________________  Follow-up New Visit   Outpatient   Name: Krystal [name] MRN: [id number]  Date: 01/02/2023  DOB: [birth year]  WU:[id number], [name]  Barbaraann Cao Georgeanna Lea, MD   REFERRING PHYSICIAN: Henreitta Leber, MD  DIAGNOSIS: No diagnosis found.  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 Multifocal osseous metastatic disease diagnosed in 2021.   Cancer Staging  Cancer of central portion of left female breast [facility]) Staging form: Breast, AJCC 7th Edition - Clinical: No stage assigned - Unsigned Laterality: Right - Pathologic: Stage IA (T1c, N0, cM0) - Signed by Sabas Sous, MD on 05/12/2014 Laterality: Right   CHIEF COMPLAINT: Here to discuss management of brain metastases from a left breast cancer primary  Narrative/Interval History 01/02/23::Krystal [name] is a 57 y.o. female who returns today to review recent MRI results and to further discuss the role of radiation therapy in management of her recently diagnosed CNS brain metastases from a left breast cancer primary. She was last seen here for re-evaluation on 11/24/22. To review from her last visit, I had a lengthy discussion with the patient specifically regarding her assessment by ophthalmology and the likely metastatic disease present in her left eye, and specifically focusing on the gross disease that is likely affecting her vision. We discussed that her treatment would likely consist of a 2-week regimen in which she would get treated 5 days a week and that I would design the radiation fields in such a way that it would still be possible for her to undergo whole brain radiation in the future if needed and/or stereotactic radiosurgery to the brain if needed.   I ultimately  recommended that we proceed with radiation as soon as reasonably possible to preserve the vision that she still has.  We did discuss that the systemic therapy that she is receiving could possibly shrink down her disease and if she wants to wait to undergo radiation therapy we could reassess when she gets another MRI in late May.  We also discussed that if she decides not to undergo radiation to her eye at this time but then develops rapidly progressive symptoms, she should call me so that we can try to expedite radiation. After our discussion, the patient opted to weigh her options and get back to [place] regarding her decision.   Her most recent MRI of the brain on 12/28/22 demonstrates stability of the right vertex pachymeningeal and right central sulcus leptomeningeal enhancing metastases when compared to imaging performed in February. Other scattered punctate foci of apparent brain enhancement on black blood postcontrast imaging also appear to remain unchanged since 11/06/2022. Innumerable punctate microhemorrhages scattered in both cerebral hemispheres were also appreciated. MRI otherwise shows no associated cerebral edema or mass effect, and near complete resolution of the left posterior globe choroid/retina metastasis since April.  Other pertinent imaging performed thus far includes a CT CAP on 12/29/22. Results are pending at this time.   Per her most recent follow-up visit with Dr. Pamelia Hoit on 12/14/22, the patient continues to tolerate enhertu well other than fatigue and some changes in smell. She was also noted to report improvement in her eye symptoms. Labs collected during that visit showed an ANC of 1.1, and her Enhertu dose was subsequently reduced prior to undergoing cycle 3  that day.   Narrative/Interval History 11/24/22: The patient returns today to further discuss the role of radiation therapy in management of her recently diagnosed metastases from a left breast cancer primary.    To review  from her initial consultation, we discussed her candidacy for [facility]. However, given her signs of early leptomeningeal disease, she was adamant to avoid whole brain radiation if possible. She was recently started on Enhertu therapy at that time as well. In light of these factors, we agreed that it would be in her best interest that we pursue close observation with repeat imaging of the brain to make it more clear as to what type of radiation is really in her best interest. (She still has a follow-up MRI of the brain scheduled for this coming May).    On her consultation date, the patient was seen in consultation by Dr. Dione Booze (ophthalmology) later that day. Dr. Dione Booze discussed a referral to a retinal specialist like Dr. Fawn Kirk, however the patient was noted to be uninterested in this at the time. Per encounter notes, pictures of the fundus and optic nerve edema were recommended, however the patient left before these could be obtained. The patient contacted our office the following day and shared that she did not have a good experience at Dr. Laruth Bouchard office. She felt as though the information obtained during her evaluation was not adequate. She has subsequently requested to be seen by another provider.  I read Dr. Laruth Bouchard notes and he favors that the process in her eye is neoplastic.  This is concordant with our radiologist's assessment from her MR imaging.  She acknowledges that she continues to have "flashes" in the superior field of vision in her left eye.    Of note: the patient recently had an echo performed on 11/21/22 which showed an EF of 55-60%.   HPI Initial Consultation 11/14/22 :Krystal [name] is a 57 y.o. female who presents today consideration of brain SRS in management of her recently diagnosed brain metastasis from a left breast cancer primary.    Metastatic left breast cancer history:    The patient underwent radiation therapy with Dr. Roselind Messier to the left breast in 2017. She later developed  osseous metastatic disease to L5 and the left femur in 2021 and received palliative radiation therapy to both of these sites under Dr. Roselind Messier. Upon record review, the patient last followed up with Dr. Roselind Messier in March of 2021.   After completing radiation therapy, the patient continued with Ibrance, letrozole, and Xgeva for her bony metastatic disease under Dr. Pamelia Hoit.   The patient underwent genetic testing on 02/16/2021. 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 This puts the patient at a 34-80% lifetime risk of breast cancer and a 2.24% lifetime risk of ovarian cancer   She did have a CT CAP performed on 04/04/21 which demonstrated an increase in size of a subcarinal lymph node measuring 1.2 cm (previously 6 mm) , a right lower paratracheal lymph node ,measuring 6 mm, interval enlargement of several lung nodules including a lingular nodule measuring 9 mm (previously 5 mm) and a left lower lobe lung nodule measuring 6 mm (previously 4 mm). CT otherwise showed stable bone metastases involving the right lateral rib, L5, and S1. The patient ultimately opted to continue with Ibrance and follow-up imaging.    Follow-up CT CAP on 10/04/2021 demonstrated: progressive sclerotic metastatic disease most notably involving T10, L2, the left  sacrum, and iliac bones; stable and enlarging mediastinal and right hilar nodes; a slight interval increase in size of the lingular and left lower lobe pulmonary nodules; a new small left lingular nodule; and a stable 13 mm left adrenal gland nodule. CT otherwise showed no findings suspicious for recurrent chest wall tumor, supraclavicular or axillary adenopathy, or hepatic metastatic disease.    Given her evidence of disease progression, the patient was switched from [place] to [employer] and [employer] in April 2023.    CT AP on 01/10/22 showed: an increase in density and size of the sclerotic  skeletal metastasis in the lumbar spine and pelvis. CT otherwise showed no interval change of the bilateral pulmonary nodules, stable thickening of the left adrenal gland, no evidence of mediastinal lymphadenopathy, and no evidence of hepatic metastasis or peritoneal metastasis.    The patient continued to receive abemaciclib for her bony metastatic disease for the remainder of 2023 into 2024. However, due to issues with her job (presumably insurance issues) the patient was not able to have any more follow-up CT's in 2023.    She was finally able to have a CT CAP performed on 08/23/22 which unfortunately showed evidence of disease progression, demonstrated by: new and enlarged pulmonary nodules consistent with progressive pulmonary metastases; enlarging small low cervical and mediastinal lymph nodes suspicious for early nodal metastasis; mild progression of the multifocal sclerotic osseous metastatic disease; a new high right hepatic lobe (segment 7) 2.2 x 2.2 cm hypoattenuating lesion; a possible additional 4A/B hepatic lesion; hepatic steatosis; and an isolated enlarging tiny omental nodule, suspicious for peritoneal metastasis.   Restaging PET scan on 09/07/22 again demonstrated disease progression, including the new nodal, pulmonary and hepatic metastases. PET also showed areas of variable FDG uptake with some new bone lesions and some areas of bony metastasis which appeared more focal, for instance in the pelvis where there was likely diffuse involvement on prior imaging. Many of the areas of known sclerosis did not show substantial uptake and some areas showed improvement / resolution of FDG uptake seen previously. However, definite active bony metastatic disease was demonstrated. PET also showed the previously demonstrated severe hepatic steatosis, and the presumed left adrenal adenoma.     Biopsy of a left supraclavicular lymph node on 09/22/22 showed metastatic carcinoma consistent with metastatic  breast carcinoma. ER 90% positive with strong staining intensity; PR 0% negative; Proliferation marker Ki67 at 60%; Her2 status negative.    Dr. Pamelia Hoit recommends Caris molecular testing and consideration of the [place] 1 clinical trial. Since testing may take quite a while to result, Dr. Pamelia Hoit would like to start her on Enhertu.    Brain Metastasis:    The patient presented to Dr. Pamelia Hoit on 10/02/22 with complaints of left sided facial numbness through and around her nose, around her cheeks, and above her lips. She also endorsed having headaches and a lingering cough. In light of her complaints, Dr Pamelia Hoit recommended a stat MRI of the brain. (Since her cough did not respond to OTC medication, she was prescribed azithromycin).    Subsequent MRI of the brain with and without contrast on 10/03/22 showed small foci of enhancement in the right central sulcus and within the anterior right frontal sulcus concerning for leptomeningeal metastatic disease. Skull metastases with associated dural tumor in the right frontal vertex region and left middle cranial fossa were also appreciated, as well an abnormal enhancement in the left lobe concerning for metastatic disease, and innumerable indeterminate foci of chronic cerebral microhemorrhage (  which can be seen with cerebral amyloid angiopathy).    Subsequently, the patient was referred to Dr. Barbaraann Cao on 10/09/22 for further management. During this visit, the patient detailed having migraine like headaches with aura for at least the past 4-5 years. She elaborated that this may have progressed in the recent weeks. She otherwise denied any other neurological deficits and reported that she is able to carry out her ADL's independently.    Dr. Barbaraann Cao ultimately recommended proceeding with Dr. Earmon Phoenix recommendation of Enhertu and [facility] which we will discuss in detail today. She began Enhertu on 10/11/22.    The patient was diagnosed with the flu on 03/13 which has  complicated her treatment course. During a follow-up visit with medical oncology on 10/31/23, she reported struggling with a persistent cough that leads to vomiting and headaches. She also endorsed persistent fatigue. Her overall biggest complaint is her cough which is chronic, unchanged, and began a couple months prior to starting the Enhertu.    Her last echo was apparently completed in 2018.  In the setting of this, as well as her persistent fatigue, recent viral infection, and cardiomegaly, Enhertu will be held until she can have a repeat echocardiogram completed.    Dr. Barbaraann Cao also recommended a repeat brain MRI with 3T magnet and SRS protocol fine cuts for better characterization of CNS burden and tumor location (detailed below).    MRI of the brain with and without contrast on 11/06/22 shows  numerous new foci of faint enhancement, including in the inferior right cerebellar hemisphere, the mid left cerebellar hemisphere, along the posterior body of the right lateral ventricle, in the inferior right parietal lobe, in the left posterior frontal centrum semiovale, near the hand motor region of the right precentral gyrus, in the subcortical white matter underlying the right middle frontal gyrus, and in the subcortical white matter anterior to the dominant lesion along the right central sulcus. MRI also demonstrates nonocclusive thrombus in the right distal transverse and sigmoid sinuses, extending into the internal jugular vein, and the previously demonstrated subchoroidal enhancement in the left globe, suspicious for metastasis.   Today, the patient states her headaches are left sided and controlled with Advil. Headaches are worsened with barometric pressure changes and certain odors. Patient also has noticed left sided vision changes. She describes these changes as "flashing orbs" and things look wavy like an "old 49s video." She reports left sided facial numbness that intermittently spreads to her  teeth. She denies any extremity numbness, tingling, or muscle weakness.    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     PREVIOUS RADIATION THERAPY: Yes    2) Diagnosis:   Metastatic breast cancer with osseous metastases Treatment Intent: Palliative Radiation Treatment Dates: 08/21/2019 through 09/03/2019 Site Technique Total Dose (Gy) Dose per Fx (Gy) Completed Fx Beam Energies  Femur Left: Ext_Lt 3D 30/30 3 10/10 10X, 15X  Lumbar Spine: L5 3D 30/30 3 10/10 10X, 15X    1) Radiation Treatment Dates: 11/25/15 - 01/14/16:  1) Left chest wall treated to 45 Gy in 25 fractions  2) Scar boost treated to 18 Gy in 9 fractions   PAST MEDICAL HISTORY:  has a past medical history of Arthritis, BRCA1 positive, Cancer (HCC), Chest wall mass (09/2015), Complication of anesthesia, Cough with sputum (09/10/2015), Dental crowns present, Diabetes mellitus without complication (HCC), Family history of Lynch syndrome, Family history of pancreatic cancer, GERD (gastroesophageal  reflux disease), Headache, History of breast cancer (2015), and Stuffy and runny nose (09/10/2015).    PAST SURGICAL HISTORY: Past Surgical History:  Procedure Laterality Date   ABDOMINAL HYSTERECTOMY     BREAST LUMPECTOMY Left 09/16/2015   Procedure: LEFT BREAST MASS EXCISION;  Surgeon: [name], MD;  Location: [facility];  Service: General;  Laterality: Left;   BREAST RECONSTRUCTION WITH PLACEMENT OF TISSUE EXPANDER AND FLEX HD (ACELLULAR HYDRATED DERMIS) Bilateral 04/16/2014   Procedure: BILATERAL BREAST RECONSTRUCTION WITH PLACEMENT OF TISSUE EXPANDER AND FLEX HD (ACELLULAR HYDRATED DERMIS);  Surgeon: Glenna Fellows, MD;  Location: [facility];  Service: Plastics;  Laterality: Bilateral;   CESAREAN SECTION  1999; 2001; 2005   CHOLECYSTECTOMY  1995   COLONOSCOPY     DEBRIDEMENT AND CLOSURE WOUND Bilateral 05/15/2014   Procedure:  DEBRIDEMENT AND CLOSURE OF BILATERAL MASECTOMY INCISIONS WITH BREAST EXPANSION;  Surgeon: Glenna Fellows, MD;  Location: [facility];  Service: Plastics;  Laterality: Bilateral;   LAPAROSCOPIC ASSISTED VAGINAL HYSTERECTOMY N/A 08/13/2014   Procedure: OPEN LAPAROSCOPY, EXPLORATORY LAPAROTOMY, TOTAL ABDOMINAL HYSTERECTOMY WITH BILATERAL SALPINGECTOMY AND BILATERAL OOPHORECTOMY;  Surgeon: Juluis Mire, MD;  Location: [facility];  Service: Gynecology;  Laterality: N/A;   LIPOSUCTION WITH LIPOFILLING Bilateral 11/13/2014   Procedure: LIPOFILLING TO BILATERAL CHEST ;  Surgeon: Glenna Fellows, MD;  Location: [facility];  Service: Plastics;  Laterality: Bilateral;   MELANOMA EXCISION N/A 04/16/2014   Procedure: WIDE LOCAL EXCISION OF BACK MELANOMA;  Surgeon: [name], MD;  Location: [facility];  Service: General;  Laterality: N/A;   PORT-A-CATH REMOVAL Right 06/14/2017   Procedure: REMOVAL PORT-A-CATH;  Surgeon: [name], MD;  Location: [facility];  Service: General;  Laterality: Right;   PORTACATH PLACEMENT Right 10/21/2015   Procedure: INSERTION PORT-A-CATH WITH ULTRASOUND ;  Surgeon: [name], MD;  Location: [facility];  Service: General;  Laterality: Right;   PORTACATH PLACEMENT N/A 10/10/2022   Procedure: INSERTION PORT-A-CATH;  Surgeon: [name], MD;  Location: [facility];  Service: General;  Laterality: N/A;   REMOVAL OF BILATERAL TISSUE EXPANDERS WITH PLACEMENT OF BILATERAL BREAST IMPLANTS Bilateral 11/13/2014   Procedure: REMOVAL OF BILATERAL TISSUE EXPANDERS WITH PLACEMENT OF BILATERAL SILICONE IMPLANTS ;  Surgeon: Glenna Fellows, MD;  Location: [facility];  Service: Plastics;  Laterality: Bilateral;   SIMPLE MASTECTOMY WITH AXILLARY SENTINEL NODE BIOPSY Bilateral 04/16/2014   Procedure: BILATERAL SKIN SPARING  MASTECTOMIES WITH LEFT AXILLARY SENTINEL NODE BIOPSY;   Surgeon: [name], MD;  Location:  SURGERY CENTER;  Service: General;  Laterality: Bilateral;    FAMILY HISTORY: family history includes BRCA 1/2 in her cousin; Breast cancer in her paternal aunt and paternal grandmother; Heart attack in her maternal grandfather; Heart disease in her maternal grandmother; Other in her cousin and father; Pancreatic cancer (age of onset: 84) in her paternal uncle.  SOCIAL HISTORY:  reports that she quit smoking about 8 years ago. Her smoking use included cigarettes. She has never used smokeless tobacco. She reports current alcohol use. She reports that she does not use drugs.  ALLERGIES: Dilaudid [hydromorphone] and Codeine  MEDICATIONS:  Current Outpatient Medications  Medication Sig Dispense Refill   aspirin EC 81 MG tablet Take 1 tablet (81 mg total) by mouth daily. Swallow whole. 90 tablet 3   aspirin-acetaminophen-caffeine (EXCEDRIN MIGRAINE) 250-250-65 MG tablet Take 2 tablets by mouth every 6 (six) hours as needed for headache.  CALCIUM PO Take 1,200 mg by mouth 2 (two) times daily.     dexamethasone (DECADRON) 4 MG tablet Take 1 tablets (4 mg) by mouth daily for 3 days after chemotherapy. Take with food. 30 tablet 1   esomeprazole (NEXIUM) 40 MG capsule Take 40 mg by mouth daily.     lidocaine-prilocaine (EMLA) cream Apply to affected area once 30 g 3   loperamide (IMODIUM) 2 MG capsule Take 2 mg by mouth daily as needed for diarrhea or loose stools. Take 2 tablets at first diarrhea and then 1 tablet after additional loose stool. Max number in 24 hours is 8 tablets.     loratadine (CLARITIN) 10 MG tablet Take 10 mg by mouth daily.     meloxicam (MOBIC) 15 MG tablet TAKE 1 TABLET(15 MG) BY MOUTH DAILY AS NEEDED FOR PAIN 30 tablet 1   ondansetron (ZOFRAN) 8 MG tablet Take 1 tablet (8 mg total) by mouth every 8 (eight) hours as needed for nausea or vomiting. Start on the third day after chemotherapy. 30 tablet 1   prochlorperazine  (COMPAZINE) 10 MG tablet Take 1 tablet (10 mg total) by mouth every 6 (six) hours as needed for nausea or vomiting. 30 tablet 1   promethazine (PHENERGAN) 25 MG suppository Place 1 suppository (25 mg total) rectally every 6 (six) hours as needed for nausea or vomiting. 12 each 0   rosuvastatin (CRESTOR) 10 MG tablet Take 1 tablet (10 mg total) by mouth daily. 90 tablet 3   traZODone (DESYREL) 50 MG tablet TAKE 1 TABLET(50 MG) BY MOUTH AT BEDTIME 90 tablet 0   venlafaxine XR (EFFEXOR-XR) 75 MG 24 hr capsule TAKE 1 CAPSULE(75 MG) BY MOUTH AT BEDTIME 90 capsule 3   dicyclomine (BENTYL) 20 MG tablet Take 1 tablet (20 mg total) by mouth every 6 (six) hours as needed for up to 7 days for spasms. 28 tablet 0   traMADol (ULTRAM) 50 MG tablet Take 1 tablet (50 mg total) by mouth every 6 (six) hours as needed. (Patient not taking: Reported on 12/29/2022) 10 tablet 0   No current facility-administered medications for this encounter.    REVIEW OF SYSTEMS:  Notable for that above.   PHYSICAL EXAM:  vitals were not taken for this visit.   General: Alert and oriented, in no acute distress *** HEENT: Head is normocephalic. Extraocular movements are intact. Oropharynx is clear. Neck: Neck is supple, no palpable cervical or supraclavicular lymphadenopathy. Heart: Regular in rate and rhythm with no murmurs, rubs, or gallops. Chest: Clear to auscultation bilaterally, with no rhonchi, wheezes, or rales. Abdomen: Soft, nontender, nondistended, with no rigidity or guarding. Extremities: No cyanosis or edema. Lymphatics: see Neck Exam Skin: No concerning lesions. Musculoskeletal: symmetric strength and muscle tone throughout. Neurologic: Cranial nerves II through XII are grossly intact. No obvious focalities. Speech is fluent. Coordination is intact. Psychiatric: Judgment and insight are intact. Affect is appropriate.   ECOG = ***  0 - Asymptomatic (Fully active, able to carry on all predisease activities  without restriction)  1 - Symptomatic but completely ambulatory (Restricted in physically strenuous activity but ambulatory and able to carry out work of a light or sedentary nature. For example, light housework, office work)  2 - Symptomatic, <50% in bed during the day (Ambulatory and capable of all self care but unable to carry out any work activities. Up and about more than 50% of waking hours)  3 - Symptomatic, >50% in bed, but not bedbound (Capable of only  limited self-care, confined to bed or chair 50% or more of waking hours)  4 - Bedbound (Completely disabled. Cannot carry on any self-care. Totally confined to bed or chair)  5 - Death   Santiago Glad MM, Creech RH, Tormey DC, et al. [id number]). "Toxicity and response criteria of the [facility] Group". Am. Evlyn Clines. Oncol. 5 (6): 649-55   LABORATORY DATA:  Lab Results  Component Value Date   WBC 2.6 (L) 12/14/2022   HGB 11.8 (L) 12/14/2022   HCT 35.5 (L) 12/14/2022   MCV 96.2 12/14/2022   PLT 273 12/14/2022   CMP     Component Value Date/Time   NA 142 12/14/2022 1355   NA 142 02/16/2017 1110   K 3.9 12/14/2022 1355   K 4.3 02/16/2017 1110   CL 107 12/14/2022 1355   CO2 29 12/14/2022 1355   CO2 27 02/16/2017 1110   GLUCOSE 127 (H) 12/14/2022 1355   GLUCOSE 190 (H) 02/16/2017 1110   BUN 6 12/14/2022 1355   BUN 7.9 02/16/2017 1110   CREATININE 0.64 12/14/2022 1355   CREATININE 0.8 02/16/2017 1110   CALCIUM 8.5 (L) 12/14/2022 1355   CALCIUM 9.4 02/16/2017 1110   PROT 6.4 (L) 12/14/2022 1355   PROT 7.4 02/16/2017 1110   ALBUMIN 3.8 12/14/2022 1355   ALBUMIN 3.7 02/16/2017 1110   AST 26 12/14/2022 1355   AST 27 02/16/2017 1110   ALT 37 12/14/2022 1355   ALT 50 02/16/2017 1110   ALKPHOS 101 12/14/2022 1355   ALKPHOS 88 02/16/2017 1110   BILITOT 0.3 12/14/2022 1355   BILITOT 0.41 02/16/2017 1110   GFRNONAA >60 12/14/2022 1355   GFRAA >60 03/15/2020 0926         RADIOGRAPHY: MR Brain W Wo  Contrast  Result Date: 12/29/2022 CLINICAL DATA:  57 year old female with metastatic breast cancer. Suspicion of leptomeningeal metastatic disease in addition to skull all and pachymeningeal tumor in February. And progression on MRI last month with multiple new punctate enhancing brain lesions. Also, imaging and clinical evidence of left posterior globe retinal/choroid enhancing metastasis. SRS treatment planning. EXAM: MRI HEAD WITHOUT AND WITH CONTRAST TECHNIQUE: Multiplanar, multiecho pulse sequences of the brain and surrounding structures were obtained without and with intravenous contrast. CONTRAST:  10 mL Vueway COMPARISON:  Brain MRI 11/06/2022, 10/03/2022. FINDINGS: Brain: Right superior central sulcus leptomeningeal enhancement (series 13, image 125 today) does not appear significantly changed since 10/03/2022 (see also series 14, image 123). FLAIR hyperintensity there but no convincing cerebral edema and no mass effect. Suboptimal vascular suppression suspected on the postcontrast black blood imaging today. And punctate enhancing foci on that sequence suspicious for punctate brain metastases are not always correlated on post-contrast MPRAGE. Suspicion of punctate enhancing metastases in the left cerebellum (series 13, image 36 and series 14, image 35) and the left anterior temporal lobe (series 13, image 57 and series 14, image 55) can be identified on both sets of axial postcontrast images, although lack of progression since 11/06/2022 might indicate these are vascular artifact. Similarly, questionable punctate right parietal metastasis series 13, image 98 is not progressed from previous black blood sequence but also not convincingly correlated on MPRAGE and vascular artifact there is favored. Likewise, these punctate lesions are not convincingly identified on the more delayed postcontrast coronal or sagittal images, further suggesting vascular artifact. Scattered nonspecific white matter T2 and FLAIR  hyperintensity with no areas specific for vasogenic edema. Additionally, SWI is positive for innumerable microhemorrhages scattered throughout the brain, as noted  in February and not significantly changed since that time. Deep gray nuclei, brainstem and cerebellum relatively spared. Plaque-like right vertex pachymeningeal thickening up to 5 mm is stable since February. No other suspicious dural thickening. No superimposed restricted diffusion to suggest acute infarction. No midline shift, mass effect, or acute intracranial hemorrhage. Cervicomedullary junction and pituitary are within normal limits. Vascular: Major intracranial vascular flow voids are stable. Skull and upper cervical spine: Sclerotic calvarium metastases. That along with little if any postcontrast enhancement. Negative visible cervical spine, spinal cord. Sinuses/Orbits: Previously seen nodular, previously-seen plaque like dark T2 signal and enhancing lesion along the posteroinferior left globe is virtually resolved. There is no T1 or FLAIR abnormality visible now, and trace if any residual T2 asymmetry (such as series 5, image 9). No new orbital mass or signal abnormality. Paranasal sinuses are stable and well aerated. Other: Mild left mastoid effusion unchanged. Visible internal auditory structures appear normal. Negative visible scalp. IMPRESSION: 1. 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 No associated cerebral edema or mass effect. 2. Other scattered punctate foci of apparent brain enhancement on black blood postcontrast imaging is both unchanged since 11/06/2022 and frequently not correlated on the other postcontrast sequences. Furthermore, there are innumerable punctate microhemorrhages scattered in both cerebral hemispheres. At this point I favor vascular and/or chronic hemosiderin artifact over genuine punctate parenchymal brain metastases. 3. 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 Study discussed by telephone with Dr. Maralyn Sago Anselmo Reihl on 12/29/2022 at 10:45 a.m. Electronically Signed   By: Odessa Fleming M.D.   On: 12/29/2022 11:10      IMPRESSION/PLAN:***    On date of service, in total, I spent *** minutes on this encounter. Patient was seen in person.   __________________________________________   Lonie Peak, MD  This document serves as a record of services personally performed by Lonie Peak, MD. It was created on her behalf by Neena Rhymes, a trained medical scribe. The creation of this record is based on the scribe's personal observations and the provider's statements to them. This document has been checked and approved by the attending provider.

## 2023-01-02 ENCOUNTER — Ambulatory Visit: Payer: Commercial Managed Care - PPO | Admitting: Radiation Oncology

## 2023-01-02 ENCOUNTER — Ambulatory Visit
Admission: RE | Admit: 2023-01-02 | Discharge: 2023-01-02 | Disposition: A | Discharge status: Home / Self Care | Payer: Commercial Managed Care - PPO | Source: Ambulatory Visit | Attending: Radiation Oncology | Admitting: Radiation Oncology

## 2023-01-02 ENCOUNTER — Encounter: Payer: Self-pay | Admitting: Radiation Oncology

## 2023-01-02 ENCOUNTER — Ambulatory Visit
Admission: RE | Admit: 2023-01-02 | Discharge: 2023-01-02 | Discharge status: Home / Self Care | Payer: Commercial Managed Care - PPO | Source: Ambulatory Visit | Attending: Radiation Oncology

## 2023-01-02 ENCOUNTER — Ambulatory Visit: Payer: Commercial Managed Care - PPO

## 2023-01-02 VITALS — BP 119/79 | HR 74 | Temp 97.9°F | Resp 20 | Ht 64.0 in | Wt 219.4 lb

## 2023-01-02 DIAGNOSIS — C50919 Malignant neoplasm of unspecified site of unspecified female breast: Secondary | ICD-10-CM

## 2023-01-02 DIAGNOSIS — C7931 Secondary malignant neoplasm of brain: Secondary | ICD-10-CM

## 2023-01-02 DIAGNOSIS — C6932 Malignant neoplasm of left choroid: Secondary | ICD-10-CM

## 2023-01-03 MED FILL — Dexamethasone Sodium Phosphate Inj 100 MG/10ML: INTRAMUSCULAR | Qty: 1 | Status: AC

## 2023-01-03 MED FILL — Fosaprepitant Dimeglumine For IV Infusion 150 MG (Base Eq): INTRAVENOUS | Qty: 5 | Status: AC

## 2023-01-04 ENCOUNTER — Inpatient Hospital Stay: Payer: Commercial Managed Care - PPO

## 2023-01-04 ENCOUNTER — Other Ambulatory Visit: Payer: Self-pay

## 2023-01-04 ENCOUNTER — Inpatient Hospital Stay: Payer: Commercial Managed Care - PPO | Admitting: Hematology and Oncology

## 2023-01-04 VITALS — BP 134/81 | HR 93 | Resp 18

## 2023-01-04 VITALS — BP 133/77 | HR 82 | Temp 97.7°F | Resp 18 | Ht 64.0 in | Wt 220.1 lb

## 2023-01-04 DIAGNOSIS — C50919 Malignant neoplasm of unspecified site of unspecified female breast: Secondary | ICD-10-CM | POA: Diagnosis not present

## 2023-01-04 DIAGNOSIS — Z95828 Presence of other vascular implants and grafts: Secondary | ICD-10-CM

## 2023-01-04 DIAGNOSIS — C50112 Malignant neoplasm of central portion of left female breast: Secondary | ICD-10-CM | POA: Diagnosis not present

## 2023-01-04 DIAGNOSIS — Z170 Estrogen receptor positive status [ER+]: Secondary | ICD-10-CM

## 2023-01-04 DIAGNOSIS — C7951 Secondary malignant neoplasm of bone: Secondary | ICD-10-CM

## 2023-01-04 DIAGNOSIS — Z0000 Encounter for general adult medical examination without abnormal findings: Secondary | ICD-10-CM

## 2023-01-04 LAB — CMP (CANCER CENTER ONLY)
ALT: 35 U/L (ref 0–44)
AST: 27 U/L (ref 15–41)
Albumin: 4 g/dL (ref 3.5–5.0)
Alkaline Phosphatase: 85 U/L (ref 38–126)
Anion gap: 8 (ref 5–15)
BUN: 8 mg/dL (ref 6–20)
CO2: 28 mmol/L (ref 22–32)
Calcium: 8.8 mg/dL — ABNORMAL LOW (ref 8.9–10.3)
Chloride: 106 mmol/L (ref 98–111)
Creatinine: 0.66 mg/dL (ref 0.44–1.00)
GFR, Estimated: >60 mL/min (ref >60)
Glucose, Bld: 154 mg/dL — ABNORMAL HIGH (ref 70–99)
Potassium: 4 mmol/L (ref 3.5–5.1)
Sodium: 142 mmol/L (ref 135–145)
Total Bilirubin: 0.4 mg/dL (ref 0.3–1.2)
Total Protein: 6.6 g/dL (ref 6.5–8.1)

## 2023-01-04 LAB — CBC WITH DIFFERENTIAL (CANCER CENTER ONLY)
Abs Immature Granulocytes: 0.01 10*3/uL (ref 0.00–0.07)
Basophils Absolute: 0 10*3/uL (ref 0.0–0.1)
Basophils Relative: 1 %
Eosinophils Absolute: 0.3 10*3/uL (ref 0.0–0.5)
Eosinophils Relative: 9 %
HCT: 36 % (ref 36.0–46.0)
Hemoglobin: 12.2 g/dL (ref 12.0–15.0)
Immature Granulocytes: 0 %
Lymphocytes Relative: 31 %
Lymphs Abs: 1 10*3/uL (ref 0.7–4.0)
MCH: 32.5 pg (ref 26.0–34.0)
MCHC: 33.9 g/dL (ref 30.0–36.0)
MCV: 96 fL (ref 80.0–100.0)
Monocytes Absolute: 0.2 10*3/uL (ref 0.1–1.0)
Monocytes Relative: 8 %
Neutro Abs: 1.6 10*3/uL — ABNORMAL LOW (ref 1.7–7.7)
Neutrophils Relative %: 51 %
Platelet Count: 253 10*3/uL (ref 150–400)
RBC: 3.75 MIL/uL — ABNORMAL LOW (ref 3.87–5.11)
RDW: 14 % (ref 11.5–15.5)
WBC Count: 3.1 10*3/uL — ABNORMAL LOW (ref 4.0–10.5)
nRBC: 0 % (ref 0.0–0.2)

## 2023-01-04 MED ORDER — PALONOSETRON HCL INJECTION 0.25 MG/5ML
0.2500 mg | Freq: Once | INTRAVENOUS | Status: AC
  Administered 2023-01-04: 0.25 mg via INTRAVENOUS
  Filled 2023-01-04: qty 5

## 2023-01-04 MED ORDER — ACETAMINOPHEN 325 MG PO TABS
650.0000 mg | ORAL_TABLET | Freq: Once | ORAL | Status: AC
  Administered 2023-01-04: 650 mg via ORAL
  Filled 2023-01-04: qty 2

## 2023-01-04 MED ORDER — SODIUM CHLORIDE 0.9% FLUSH
10.0000 mL | INTRAVENOUS | Status: DC | PRN
  Administered 2023-01-04: 10 mL

## 2023-01-04 MED ORDER — FAM-TRASTUZUMAB DERUXTECAN-NXKI CHEMO 100 MG IV SOLR
3.2000 mg/kg | Freq: Once | INTRAVENOUS | Status: AC
  Administered 2023-01-04: 300 mg via INTRAVENOUS
  Filled 2023-01-04: qty 15

## 2023-01-04 MED ORDER — SODIUM CHLORIDE 0.9 % IV SOLN
10.0000 mg | Freq: Once | INTRAVENOUS | Status: AC
  Administered 2023-01-04: 10 mg via INTRAVENOUS
  Filled 2023-01-04: qty 10

## 2023-01-04 MED ORDER — HEPARIN SOD (PORK) LOCK FLUSH 100 UNIT/ML IV SOLN
500.0000 [IU] | Freq: Once | INTRAVENOUS | Status: AC | PRN
  Administered 2023-01-04: 500 [IU]

## 2023-01-04 MED ORDER — SODIUM CHLORIDE 0.9 % IV SOLN
150.0000 mg | Freq: Once | INTRAVENOUS | Status: AC
  Administered 2023-01-04: 150 mg via INTRAVENOUS
  Filled 2023-01-04: qty 150

## 2023-01-04 MED ORDER — DEXTROSE 5 % IV SOLN: Freq: Once | INTRAVENOUS | Status: AC

## 2023-01-04 MED ORDER — DIPHENHYDRAMINE HCL 25 MG PO CAPS
25.0000 mg | ORAL_CAPSULE | Freq: Once | ORAL | Status: AC
  Administered 2023-01-04: 25 mg via ORAL
  Filled 2023-01-04: qty 1

## 2023-01-04 MED ORDER — SODIUM CHLORIDE 0.9% FLUSH
10.0000 mL | Freq: Once | INTRAVENOUS | Status: AC
  Administered 2023-01-04: 10 mL

## 2023-01-04 NOTE — Progress Notes (Signed)
Pt. Here for port flush/lab.  States Dr. Pamelia Hoit was suppose to put order in for [facility].  Secure chatted Dr. Pamelia Hoit and Abner Greenspan.  Theora Gianotti states if pt. HGA1C is abnormal, pt. Will have to follow up with and  establish care from a PCP.  Pt. States ok.  Order placed and labs were drawn.

## 2023-01-04 NOTE — Assessment & Plan Note (Signed)
08/22/2019: PET CT scan: Metastatic disease in the chest with multiple lung nodules all subcentimeter size and hypermetabolic lymph nodes (right paratracheal node 7 mm, subcarinal node 1 cm, right hilar node 6 mm), retroperitoneal and upper pelvic common iliac lymph nodes, bone metastases especially L5, L4, T6, right 10th rib, left proximal femur   Bone biopsy was not felt to be reliable in addition to the effect of radiation on the bone. Palliative radiation completed 09/03/2019 Guardant 360: BRCA1 mutation, TMB 5.36, MSI: Normal   Prior treatment: Ibrance with letrozole and Xgeva for bone metastases switched to abemaciclib and Fulvestrant   Bone metastasis: Patient does not want to take bisphosphonates CT CAP 08/23/2022: New and enlarged pulmonary nodule consistent with progression.  Enlarged small low cervical and mediastinal nodes.  Multiple bone metastases minimally progressive.  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  Skull metastases, abnormal enhancement left globe  ---------------------------------------------------------------- Current treatment:: Enhertu cycle 4 Enhertu toxicities: Severe flu after the first cycle of chemo and she had nausea vomiting and lost 10 pounds. Fatigue ANC 1.1 today: I reduce the dosage of Enhertu and we are okay to proceed with today's treatment.   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  Other scattered foci of brain enhancement unchanged resolved left posterior globe metastases   Return to clinic in 3  weeks for cycle 5

## 2023-01-05 LAB — HEMOGLOBIN A1C
Hgb A1c MFr Bld: 6.4 % — ABNORMAL HIGH (ref 4.8–5.6)
Mean Plasma Glucose: 137 mg/dL

## 2023-01-08 ENCOUNTER — Other Ambulatory Visit: Payer: Self-pay | Admitting: Radiation Therapy

## 2023-01-08 ENCOUNTER — Encounter: Payer: Self-pay | Admitting: Radiation Therapy

## 2023-01-08 DIAGNOSIS — C7931 Secondary malignant neoplasm of brain: Secondary | ICD-10-CM

## 2023-01-08 NOTE — Progress Notes (Signed)
I sent an email to pt. (This is her preferred communication method) to discuss the review of her imaging in this morning's brain and spine conference.   Information shared:  Per Dr. Colletta Maryland previous visit with you, we will plan to rescan in three months with a follow-up for you to see Dr. Basilio Cairo again to review that MRI. I have placed an order for that scan to be completed on 9/3, [facility] will call you closer to that date to schedule or you can reach out to them to schedule by calling [phone]. Your follow-up with Dr. Basilio Cairo is scheduled on 9/10 @ 2:40.  Based on the review in conference this morning, no intervention is needed at this time for the microhemorrhages noted on the brain MRI scan. We will continue to follow this on your future MRI scans as well as the improved/resolved orbital metastasis.   Krystal [name] R.T.(R)(T) Radiation Special Procedures Navigator

## 2023-01-09 ENCOUNTER — Other Ambulatory Visit: Payer: Self-pay

## 2023-01-10 ENCOUNTER — Ambulatory Visit: Payer: Commercial Managed Care - PPO | Admitting: Radiation Oncology

## 2023-01-24 ENCOUNTER — Ambulatory Visit: Payer: Commercial Managed Care - PPO | Admitting: Hematology and Oncology

## 2023-01-24 ENCOUNTER — Telehealth: Payer: Self-pay | Admitting: Hematology and Oncology

## 2023-01-24 MED FILL — Fosaprepitant Dimeglumine For IV Infusion 150 MG (Base Eq): INTRAVENOUS | Qty: 5 | Status: AC

## 2023-01-24 MED FILL — Dexamethasone Sodium Phosphate Inj 100 MG/10ML: INTRAMUSCULAR | Qty: 1 | Status: AC

## 2023-01-24 NOTE — Telephone Encounter (Signed)
Scheduled appointments per WQ. Patient is aware of the made appointments.  

## 2023-01-25 ENCOUNTER — Inpatient Hospital Stay: Payer: Commercial Managed Care - PPO | Admitting: Hematology and Oncology

## 2023-01-25 ENCOUNTER — Inpatient Hospital Stay: Payer: Commercial Managed Care - PPO

## 2023-01-28 NOTE — Progress Notes (Signed)
Patient Care Team: Patient, [name] Krystal Croissant, MD as Medical Oncologist (Hematology and Oncology) Krystal Blackbird, MD as Consulting Physician (Radiation Oncology) Krystal Filler Larna Daughters, NP as Nurse Practitioner (Hematology and Oncology) Krystal Loron, MD as Consulting Physician (General Surgery) Krystal [name], RPH-CPP as Pharmacist (Hematology and Oncology)  DIAGNOSIS: No diagnosis found.  SUMMARY OF ONCOLOGIC HISTORY: Oncology History  Cancer of central portion of left female breast (HCC)  04/02/2014 Genetic Testing   BRCA1 [id number] pathogenic mutation identified on gene sequce and del/dup analyses of BRCA1 and BRCA2.  BRCA1 [id number] VUS identified as well.  The report date is 04/02/2014.  UPDATE: BRCA1 Z.[id number] VUS has been reclassified as a likely benign variant.  The reclassification date is 04/21/2020.   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 N0 M0 stage IA: Oncotype DX 13 (ROR 9%)   04/16/2014 Surgery   Left upper back melanoma resected no residual melanoma identified on re-resection margins negative   05/26/2014 - 06/08/2015 Anti-estrogen oral therapy   Tamoxifen 20 mg daily with a plan to switch her to aromatase inhibitors once she gets oophorectomy ( patient did not continue antiestrogen therapy by choice)   08/13/2014 Surgery   oophorectomy with total abdominal hysterectomy   09/08/2015 -  Anti-estrogen oral therapy   Resumed anastrozole after she found recurrence of breast cancer; stopped in January 2018, started letrozole 2.5 mg daily 10/12/2016; switched to Exemestane 25 mg daily on 01/03/17   09/16/2015 Surgery   Skin, left: IDC grade 3; 0.5 cm, margins negative, ER 95%, PR 10%, HER-2 positive ratio 2.08   11/08/2015 - 02/2017 Chemotherapy    Herceptin every 3 weeks plus anastrozole   11/24/2015 - 01/14/2016 Radiation Therapy   Adjuvant radiation therapy by Dr. Roselind Messier   01/2017 -  Anti-estrogen oral therapy   Exemestane daily (stopped others due to side effects), due to muscle aches and pains switch to tamoxifen 5 mg daily starting 12/18/2017   08/07/2019 Relapse/Recurrence   Bone scan: Sclerotic metastatic lesions involving left femoral neck, right ninth rib and entire L5 vertebra; MRI left hip: Diffuse signal abnormality L5 vertebral body with possible extraosseous extension   08/22/2019 - 09/03/2019 Radiation Therapy   Palliative radiation with Dr. Roselind Messier to L5 and left proixmal femur.    10/2021 Treatment Plan Change   Fulvestrant and Verzenio   08/23/2022 Imaging   CT CAP 08/23/2022: New and enlarged pulmonary nodule consistent with progression.  Enlarged small low cervical and mediastinal nodes.  Multiple bone metastases minimally progressive.  Liver lesions evaluation is challenging because of hepatic steatosis with suspected liver progression, isolated tiny omental nodule     09/07/2022 PET scan   IMPRESSION: 1. Worsening of disease now with nodal, pulmonary and hepatic metastases. 2. Areas of variable FDG uptake with some new bone lesions and some areas of bony metastasis which are more focal, for instance in the pelvis where there was likely diffuse involvement on prior imaging. Many of the areas of sclerosis do not show substantial uptake as outlined above and some areas show improvement/resolution of FDG uptake seen previously but there are signs of active bony metastatic disease currently. 3. Likely LEFT adrenal adenoma. 4. Severe hepatic steatosis. 5. Aortic atherosclerosis.   Primary malignant neoplasm of breast with metastasis (HCC)  08/25/2019 Initial Diagnosis   Primary  malignant neoplasm of breast with metastasis (HCC)   10/11/2022 -  Chemotherapy   Patient is on Treatment Plan : BREAST METASTATIC  Fam-Trastuzumab Deruxtecan-nxki (Enhertu) (5.4) q21d       CHIEF COMPLIANT: Follow-up Enhertu cycle 5  INTERVAL HISTORY: Krystal [name] is a 57 year old above-mentioned cement site breast cancer. Currently on Enhertu. She presents to the clinic for a follow-up.    ALLERGIES:  is allergic to dilaudid [hydromorphone] and codeine.  MEDICATIONS:  Current Outpatient Medications  Medication Sig Dispense Refill   aspirin EC 81 MG tablet Take 1 tablet (81 mg total) by mouth daily. Swallow whole. 90 tablet 3   aspirin-acetaminophen-caffeine (EXCEDRIN MIGRAINE) 250-250-65 MG tablet Take 2 tablets by mouth every 6 (six) hours as needed for headache.     CALCIUM PO Take 1,200 mg by mouth 2 (two) times daily.     dexamethasone (DECADRON) 4 MG tablet Take 1 tablets (4 mg) by mouth daily for 3 days after chemotherapy. Take with food. 30 tablet 1   dicyclomine (BENTYL) 20 MG tablet Take 1 tablet (20 mg total) by mouth every 6 (six) hours as needed for up to 7 days for spasms. 28 tablet 0   esomeprazole (NEXIUM) 40 MG capsule Take 40 mg by mouth daily.     lidocaine-prilocaine (EMLA) cream Apply to affected area once 30 g 3   loperamide (IMODIUM) 2 MG capsule Take 2 mg by mouth daily as needed for diarrhea or loose stools. Take 2 tablets at first diarrhea and then 1 tablet after additional loose stool. Max number in 24 hours is 8 tablets.     loratadine (CLARITIN) 10 MG tablet Take 10 mg by mouth daily.     meloxicam (MOBIC) 15 MG tablet TAKE 1 TABLET(15 MG) BY MOUTH DAILY AS NEEDED FOR PAIN 30 tablet 1   ondansetron (ZOFRAN) 8 MG tablet Take 1 tablet (8 mg total) by mouth every 8 (eight) hours as needed for nausea or vomiting. Start on the third day after chemotherapy. 30 tablet 1   prochlorperazine (COMPAZINE) 10 MG tablet Take 1 tablet (10 mg total) by mouth every 6 (six) hours as needed for nausea or vomiting. 30 tablet 1   promethazine (PHENERGAN) 25 MG suppository Place 1 suppository (25 mg total)  rectally every 6 (six) hours as needed for nausea or vomiting. 12 each 0   rosuvastatin (CRESTOR) 10 MG tablet Take 1 tablet (10 mg total) by mouth daily. 90 tablet 3   traMADol (ULTRAM) 50 MG tablet Take 1 tablet (50 mg total) by mouth every 6 (six) hours as needed. 10 tablet 0   traZODone (DESYREL) 50 MG tablet TAKE 1 TABLET(50 MG) BY MOUTH AT BEDTIME 90 tablet 0   venlafaxine XR (EFFEXOR-XR) 75 MG 24 hr capsule TAKE 1 CAPSULE(75 MG) BY MOUTH AT BEDTIME 90 capsule 3   No current facility-administered medications for this visit.    PHYSICAL EXAMINATION: ECOG PERFORMANCE STATUS: {CHL ONC ECOG [phone]}  There were no vitals filed for this visit. There were no vitals filed for this visit.  BREAST:*** No palpable masses or nodules in either right or left breasts. No palpable axillary supraclavicular or infraclavicular adenopathy no breast tenderness or nipple discharge. (exam performed in the presence of a chaperone)  LABORATORY DATA:  I have reviewed the data as listed    Latest Ref Rng & Units 01/04/2023    2:11 PM 12/14/2022    1:55 PM 11/24/2022   11:30 AM  CMP  Glucose 70 - 99  mg/dL 161  096  045   BUN 6 - 20 mg/dL 8  6  9    Creatinine 0.44 - 1.00 mg/dL 4.09  8.11  9.14   Sodium 135 - 145 mmol/L 142  142  141   Potassium 3.5 - 5.1 mmol/L 4.0  3.9  4.0   Chloride 98 - 111 mmol/L 106  107  106   CO2 22 - 32 mmol/L 28  29  29    Calcium 8.9 - 10.3 mg/dL 8.8  8.5  9.2   Total Protein 6.5 - 8.1 g/dL 6.6  6.4  6.8   Total Bilirubin 0.3 - 1.2 mg/dL 0.4  0.3  0.4   Alkaline Phos 38 - 126 U/L 85  101  116   AST 15 - 41 U/L 27  26  43   ALT 0 - 44 U/L 35  37  58     Lab Results  Component Value Date   WBC 3.1 (L) 01/04/2023   HGB 12.2 01/04/2023   HCT 36.0 01/04/2023   MCV 96.0 01/04/2023   PLT 253 01/04/2023   NEUTROABS 1.6 (L) 01/04/2023    ASSESSMENT & PLAN:  No problem-specific Assessment & Plan notes found for this encounter.    No orders of the defined types were  placed in this encounter.  The patient has a good understanding of the overall plan. she agrees with it. she will call with any problems that may develop before the next visit here. Total time spent: 30 mins including face to face time and time spent for planning, charting and co-ordination of care   Sherlyn Lick, CMA 01/28/23    I Janan Ridge am acting as a Neurosurgeon for [employer]  ***

## 2023-01-30 MED FILL — Fosaprepitant Dimeglumine For IV Infusion 150 MG (Base Eq): INTRAVENOUS | Qty: 5 | Status: AC

## 2023-01-30 MED FILL — Dexamethasone Sodium Phosphate Inj 100 MG/10ML: INTRAMUSCULAR | Qty: 1 | Status: AC

## 2023-01-31 ENCOUNTER — Inpatient Hospital Stay: Payer: Commercial Managed Care - PPO | Admitting: Hematology and Oncology

## 2023-01-31 ENCOUNTER — Inpatient Hospital Stay: Payer: Commercial Managed Care - PPO | Attending: Hematology and Oncology

## 2023-01-31 ENCOUNTER — Inpatient Hospital Stay: Payer: Commercial Managed Care - PPO

## 2023-01-31 ENCOUNTER — Other Ambulatory Visit: Payer: Self-pay

## 2023-01-31 VITALS — BP 129/80 | HR 81 | Temp 97.9°F | Resp 18 | Ht 64.0 in | Wt 219.4 lb

## 2023-01-31 DIAGNOSIS — C50919 Malignant neoplasm of unspecified site of unspecified female breast: Secondary | ICD-10-CM

## 2023-01-31 DIAGNOSIS — Z85820 Personal history of malignant melanoma of skin: Secondary | ICD-10-CM | POA: Insufficient documentation

## 2023-01-31 DIAGNOSIS — Z79899 Other long term (current) drug therapy: Secondary | ICD-10-CM | POA: Diagnosis not present

## 2023-01-31 DIAGNOSIS — Z9013 Acquired absence of bilateral breasts and nipples: Secondary | ICD-10-CM | POA: Insufficient documentation

## 2023-01-31 DIAGNOSIS — Z7982 Long term (current) use of aspirin: Secondary | ICD-10-CM | POA: Diagnosis not present

## 2023-01-31 DIAGNOSIS — C50112 Malignant neoplasm of central portion of left female breast: Secondary | ICD-10-CM | POA: Insufficient documentation

## 2023-01-31 DIAGNOSIS — K760 Fatty (change of) liver, not elsewhere classified: Secondary | ICD-10-CM | POA: Insufficient documentation

## 2023-01-31 DIAGNOSIS — Z79811 Long term (current) use of aromatase inhibitors: Secondary | ICD-10-CM | POA: Insufficient documentation

## 2023-01-31 DIAGNOSIS — Z923 Personal history of irradiation: Secondary | ICD-10-CM | POA: Insufficient documentation

## 2023-01-31 DIAGNOSIS — Z7952 Long term (current) use of systemic steroids: Secondary | ICD-10-CM | POA: Insufficient documentation

## 2023-01-31 DIAGNOSIS — I700 Atherosclerosis of aorta: Secondary | ICD-10-CM | POA: Diagnosis not present

## 2023-01-31 DIAGNOSIS — Z5112 Encounter for antineoplastic immunotherapy: Secondary | ICD-10-CM | POA: Insufficient documentation

## 2023-01-31 DIAGNOSIS — Z170 Estrogen receptor positive status [ER+]: Secondary | ICD-10-CM | POA: Diagnosis not present

## 2023-01-31 DIAGNOSIS — C7951 Secondary malignant neoplasm of bone: Secondary | ICD-10-CM | POA: Diagnosis not present

## 2023-01-31 DIAGNOSIS — R519 Headache, unspecified: Secondary | ICD-10-CM | POA: Diagnosis not present

## 2023-01-31 DIAGNOSIS — Z95828 Presence of other vascular implants and grafts: Secondary | ICD-10-CM

## 2023-01-31 LAB — CBC WITH DIFFERENTIAL (CANCER CENTER ONLY)
Abs Immature Granulocytes: 0.01 10*3/uL (ref 0.00–0.07)
Basophils Absolute: 0 10*3/uL (ref 0.0–0.1)
Basophils Relative: 1 %
Eosinophils Absolute: 0.3 10*3/uL (ref 0.0–0.5)
Eosinophils Relative: 9 %
HCT: 37.7 % (ref 36.0–46.0)
Hemoglobin: 12.7 g/dL (ref 12.0–15.0)
Immature Granulocytes: 0 %
Lymphocytes Relative: 27 %
Lymphs Abs: 0.9 10*3/uL (ref 0.7–4.0)
MCH: 32.2 pg (ref 26.0–34.0)
MCHC: 33.7 g/dL (ref 30.0–36.0)
MCV: 95.7 fL (ref 80.0–100.0)
Monocytes Absolute: 0.3 10*3/uL (ref 0.1–1.0)
Monocytes Relative: 9 %
Neutro Abs: 1.9 10*3/uL (ref 1.7–7.7)
Neutrophils Relative %: 54 %
Platelet Count: 236 10*3/uL (ref 150–400)
RBC: 3.94 MIL/uL (ref 3.87–5.11)
RDW: 13.7 % (ref 11.5–15.5)
WBC Count: 3.4 10*3/uL — ABNORMAL LOW (ref 4.0–10.5)
nRBC: 0 % (ref 0.0–0.2)

## 2023-01-31 LAB — CMP (CANCER CENTER ONLY)
ALT: 40 U/L (ref 0–44)
AST: 32 U/L (ref 15–41)
Albumin: 3.9 g/dL (ref 3.5–5.0)
Alkaline Phosphatase: 83 U/L (ref 38–126)
Anion gap: 6 (ref 5–15)
BUN: 8 mg/dL (ref 6–20)
CO2: 29 mmol/L (ref 22–32)
Calcium: 9.5 mg/dL (ref 8.9–10.3)
Chloride: 106 mmol/L (ref 98–111)
Creatinine: 0.56 mg/dL (ref 0.44–1.00)
GFR, Estimated: >60 mL/min (ref >60)
Glucose, Bld: 110 mg/dL — ABNORMAL HIGH (ref 70–99)
Potassium: 4.3 mmol/L (ref 3.5–5.1)
Sodium: 141 mmol/L (ref 135–145)
Total Bilirubin: 0.4 mg/dL (ref 0.3–1.2)
Total Protein: 7.1 g/dL (ref 6.5–8.1)

## 2023-01-31 MED ORDER — SODIUM CHLORIDE 0.9 % IV SOLN
150.0000 mg | Freq: Once | INTRAVENOUS | Status: AC
  Administered 2023-01-31: 150 mg via INTRAVENOUS
  Filled 2023-01-31: qty 5
  Filled 2023-01-31: qty 150

## 2023-01-31 MED ORDER — DEXTROSE 5 % IV SOLN: Freq: Once | INTRAVENOUS | Status: AC

## 2023-01-31 MED ORDER — SODIUM CHLORIDE 0.9 % IV SOLN
10.0000 mg | Freq: Once | INTRAVENOUS | Status: AC
  Administered 2023-01-31: 10 mg via INTRAVENOUS
  Filled 2023-01-31: qty 10
  Filled 2023-01-31: qty 1

## 2023-01-31 MED ORDER — DIPHENHYDRAMINE HCL 25 MG PO CAPS
25.0000 mg | ORAL_CAPSULE | Freq: Once | ORAL | Status: AC
  Administered 2023-01-31: 25 mg via ORAL
  Filled 2023-01-31: qty 1

## 2023-01-31 MED ORDER — SODIUM CHLORIDE 0.9% FLUSH
10.0000 mL | Freq: Once | INTRAVENOUS | Status: AC
  Administered 2023-01-31: 10 mL

## 2023-01-31 MED ORDER — PALONOSETRON HCL INJECTION 0.25 MG/5ML
0.2500 mg | Freq: Once | INTRAVENOUS | Status: AC
  Administered 2023-01-31: 0.25 mg via INTRAVENOUS
  Filled 2023-01-31: qty 5

## 2023-01-31 MED ORDER — SODIUM CHLORIDE 0.9% FLUSH
10.0000 mL | INTRAVENOUS | Status: DC | PRN
  Administered 2023-01-31: 10 mL

## 2023-01-31 MED ORDER — ACETAMINOPHEN 325 MG PO TABS
650.0000 mg | ORAL_TABLET | Freq: Once | ORAL | Status: AC
  Administered 2023-01-31: 650 mg via ORAL
  Filled 2023-01-31: qty 2

## 2023-01-31 MED ORDER — HEPARIN SOD (PORK) LOCK FLUSH 100 UNIT/ML IV SOLN
500.0000 [IU] | Freq: Once | INTRAVENOUS | Status: AC | PRN
  Administered 2023-01-31: 500 [IU]

## 2023-01-31 MED ORDER — FAM-TRASTUZUMAB DERUXTECAN-NXKI CHEMO 100 MG IV SOLR
3.2000 mg/kg | Freq: Once | INTRAVENOUS | Status: AC
  Administered 2023-01-31: 300 mg via INTRAVENOUS
  Filled 2023-01-31: qty 15

## 2023-01-31 NOTE — Assessment & Plan Note (Signed)
08/22/2019: PET CT scan: Metastatic disease in the chest with multiple lung nodules all subcentimeter size and hypermetabolic lymph nodes (right paratracheal node 7 mm, subcarinal node 1 cm, right hilar node 6 mm), retroperitoneal and upper pelvic common iliac lymph nodes, bone metastases especially L5, L4, T6, right 10th rib, left proximal femur   Bone biopsy was not felt to be reliable in addition to the effect of radiation on the bone. Palliative radiation completed 09/03/2019 Guardant 360: BRCA1 mutation, TMB 5.36, MSI: Normal   Prior treatment: Ibrance with letrozole and Xgeva for bone metastases switched to abemaciclib and Fulvestrant   Bone metastasis: Patient does not want to take bisphosphonates CT CAP 08/23/2022: New and enlarged pulmonary nodule consistent with progression.  Enlarged small low cervical and mediastinal nodes.  Multiple bone metastases minimally progressive.  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  Skull metastases, abnormal enhancement left globe  ---------------------------------------------------------------- Current treatment:: Enhertu cycle 5 Enhertu toxicities: Severe flu after the first cycle of chemo and she had nausea vomiting and lost 10 pounds. Fatigue ANC 1.6 today    CT CAP: 12/29/2022: Interval decrease in size of supraclavicular, mediastinal, abdominal, lung nodules, liver metastases and bone metastases. 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  Other scattered foci of brain enhancement unchanged resolved left posterior globe  metastases   Our plan is to finish 6 cycles of chemo and obtain a PET CT scan. Return to clinic in 3 weeks for cycle 6

## 2023-01-31 NOTE — Patient Instructions (Signed)
[facility]  Discharge Instructions: Thank you for choosing [facility] to provide your oncology and hematology care.   If you have a lab appointment with the Cancer Center, please go directly to the Cancer Center and check in at the registration area.   Wear comfortable clothing and clothing appropriate for easy access to any Portacath or PICC line.   We strive to give you quality time with your provider. You may need to reschedule your appointment if you arrive late (15 or more minutes).  Arriving late affects you and other patients whose appointments are after yours.  Also, if you miss three or more appointments without notifying the office, you may be dismissed from the clinic at the provider's discretion.      For prescription refill requests, have your pharmacy contact our office and allow 72 hours for refills to be completed.    Today you received the following chemotherapy and/or immunotherapy agents Enhertu.   To help prevent nausea and vomiting after your treatment, we encourage you to take your nausea medication as directed.  BELOW ARE SYMPTOMS THAT SHOULD BE REPORTED IMMEDIATELY: *FEVER GREATER THAN 100.4 F (38 C) OR HIGHER *CHILLS OR SWEATING *NAUSEA AND VOMITING THAT IS NOT CONTROLLED WITH YOUR NAUSEA MEDICATION *UNUSUAL SHORTNESS OF BREATH *UNUSUAL BRUISING OR BLEEDING *URINARY PROBLEMS (pain or burning when urinating, or frequent urination) *BOWEL PROBLEMS (unusual diarrhea, constipation, pain near the anus) TENDERNESS IN MOUTH AND THROAT WITH OR WITHOUT PRESENCE OF ULCERS (sore throat, sores in mouth, or a toothache) UNUSUAL RASH, SWELLING OR PAIN  UNUSUAL VAGINAL DISCHARGE OR ITCHING   Items with * indicate a potential emergency and should be followed up as soon as possible or go to the Emergency Department if any problems should occur.  Please show the CHEMOTHERAPY ALERT CARD or IMMUNOTHERAPY ALERT CARD at check-in  to the Emergency Department and triage nurse.  Should you have questions after your visit or need to cancel or reschedule your appointment, please contact [facility]  Dept: [phone]  and follow the prompts.  Office hours are 8:00 a.m. to 4:30 p.m. Monday - Friday. Please note that voicemails left after 4:00 p.m. may not be returned until the following business day.  We are closed weekends and major holidays. You have access to a nurse at all times for urgent questions. Please call the main number to the clinic Dept: [phone] and follow the prompts.   For any non-urgent questions, you may also contact your provider using MyChart. We now offer e-Visits for anyone 18 and older to request care online for non-urgent symptoms. For details visit [email or web address].   Also download the MyChart app! Go to the app store, search "MyChart", open the app, select [facility], and log in with your MyChart username and password.   

## 2023-02-15 ENCOUNTER — Other Ambulatory Visit: Payer: Self-pay | Admitting: Hematology and Oncology

## 2023-02-19 NOTE — Progress Notes (Signed)
Patient Care Team: Patient, [name] Serena Croissant, MD as Medical Oncologist (Hematology and Oncology) [name], MD as Consulting Physician (Radiation Oncology) [name], NP as Nurse Practitioner (Hematology and Oncology) [name], MD as Consulting Physician (General Surgery) [name], RPH-CPP as Pharmacist (Hematology and Oncology)  DIAGNOSIS: No diagnosis found.  SUMMARY OF ONCOLOGIC HISTORY: Oncology History  Cancer of central portion of left female breast (HCC)  04/02/2014 Genetic Testing   BRCA1 [id number] pathogenic mutation identified on gene sequce and del/dup analyses of BRCA1 and BRCA2.  BRCA1 p.H1283R VUS identified as well.  The report date is 04/02/2014.  UPDATE: BRCA1 Z.[id number] VUS has been reclassified as a likely benign variant.  The reclassification date is 04/21/2020.   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 N0 M0 stage IA: Oncotype DX 13 (ROR 9%)   04/16/2014 Surgery   Left upper back melanoma resected no residual melanoma identified on re-resection margins negative   05/26/2014 - 06/08/2015 Anti-estrogen oral therapy   Tamoxifen 20 mg daily with a plan to switch her to aromatase inhibitors once she gets oophorectomy ( patient did not continue antiestrogen therapy by choice)   08/13/2014 Surgery   oophorectomy with total abdominal hysterectomy   09/08/2015 -  Anti-estrogen oral therapy   Resumed anastrozole after she found recurrence of breast cancer; stopped in January 2018, started letrozole 2.5 mg daily 10/12/2016; switched to Exemestane 25 mg daily on 01/03/17   09/16/2015 Surgery   Skin, left: IDC grade 3; 0.5 cm, margins negative, ER 95%, PR 10%, HER-2 positive ratio 2.08   11/08/2015 - 02/2017 Chemotherapy    Herceptin every 3 weeks plus anastrozole   11/24/2015 - 01/14/2016 Radiation Therapy   Adjuvant radiation therapy by Dr. Roselind Messier   01/2017 -  Anti-estrogen oral therapy   Exemestane daily (stopped others due to side effects), due to muscle aches and pains switch to tamoxifen 5 mg daily starting 12/18/2017   08/07/2019 Relapse/Recurrence   Bone scan: Sclerotic metastatic lesions involving left femoral neck, right ninth rib and entire L5 vertebra; MRI left hip: Diffuse signal abnormality L5 vertebral body with possible extraosseous extension   08/22/2019 - 09/03/2019 Radiation Therapy   Palliative radiation with Dr. Roselind Messier to L5 and left proixmal femur.    10/2021 Treatment Plan Change   Fulvestrant and Verzenio   08/23/2022 Imaging   CT CAP 08/23/2022: New and enlarged pulmonary nodule consistent with progression.  Enlarged small low cervical and mediastinal nodes.  Multiple bone metastases minimally progressive.  Liver lesions evaluation is challenging because of hepatic steatosis with suspected liver progression, isolated tiny omental nodule     09/07/2022 PET scan   IMPRESSION: 1. Worsening of disease now with nodal, pulmonary and hepatic metastases. 2. Areas of variable FDG uptake with some new bone lesions and some areas of bony metastasis which are more focal, for instance in the pelvis where there was likely diffuse involvement on prior imaging. Many of the areas of sclerosis do not show substantial uptake as outlined above and some areas show improvement/resolution of FDG uptake seen previously but there are signs of active bony metastatic disease currently. 3. Likely LEFT adrenal adenoma. 4. Severe hepatic steatosis. 5. Aortic atherosclerosis.   Primary malignant neoplasm of breast with metastasis (HCC)  08/25/2019 Initial Diagnosis   Primary  malignant neoplasm of breast with metastasis (HCC)   10/11/2022 -  Chemotherapy   Patient is on Treatment Plan : BREAST METASTATIC  Fam-Trastuzumab Deruxtecan-nxki (Enhertu) (5.4) q21d       CHIEF COMPLIANT: Follow-up Enhertu cycle 6  INTERVAL HISTORY: Krystal [name] is a 57 year old above-mentioned cement site breast cancer. Currently on Enhertu. She presents to the clinic for a follow-up.    ALLERGIES:  is allergic to dilaudid [hydromorphone] and codeine.  MEDICATIONS:  Current Outpatient Medications  Medication Sig Dispense Refill   aspirin EC 81 MG tablet Take 1 tablet (81 mg total) by mouth daily. Swallow whole. 90 tablet 3   aspirin-acetaminophen-caffeine (EXCEDRIN MIGRAINE) 250-250-65 MG tablet Take 2 tablets by mouth every 6 (six) hours as needed for headache.     CALCIUM PO Take 1,200 mg by mouth 2 (two) times daily.     dexamethasone (DECADRON) 4 MG tablet Take 1 tablets (4 mg) by mouth daily for 3 days after chemotherapy. Take with food. 30 tablet 1   dicyclomine (BENTYL) 20 MG tablet Take 1 tablet (20 mg total) by mouth every 6 (six) hours as needed for up to 7 days for spasms. 28 tablet 0   esomeprazole (NEXIUM) 40 MG capsule Take 40 mg by mouth daily.     lidocaine-prilocaine (EMLA) cream Apply to affected area once 30 g 3   loperamide (IMODIUM) 2 MG capsule Take 2 mg by mouth daily as needed for diarrhea or loose stools. Take 2 tablets at first diarrhea and then 1 tablet after additional loose stool. Max number in 24 hours is 8 tablets.     loratadine (CLARITIN) 10 MG tablet Take 10 mg by mouth daily.     meloxicam (MOBIC) 15 MG tablet TAKE 1 TABLET(15 MG) BY MOUTH DAILY AS NEEDED FOR PAIN 30 tablet 1   ondansetron (ZOFRAN) 8 MG tablet Take 1 tablet (8 mg total) by mouth every 8 (eight) hours as needed for nausea or vomiting. Start on the third day after chemotherapy. 30 tablet 1   prochlorperazine (COMPAZINE) 10 MG tablet Take 1 tablet (10 mg total) by mouth every 6 (six) hours as needed for nausea or vomiting. 30 tablet 1   promethazine (PHENERGAN) 25 MG suppository Place 1 suppository (25 mg total)  rectally every 6 (six) hours as needed for nausea or vomiting. 12 each 0   rosuvastatin (CRESTOR) 10 MG tablet Take 1 tablet (10 mg total) by mouth daily. 90 tablet 3   traMADol (ULTRAM) 50 MG tablet Take 1 tablet (50 mg total) by mouth every 6 (six) hours as needed. 10 tablet 0   traZODone (DESYREL) 50 MG tablet TAKE 1 TABLET(50 MG) BY MOUTH AT BEDTIME 90 tablet 0   venlafaxine XR (EFFEXOR-XR) 75 MG 24 hr capsule TAKE 1 CAPSULE(75 MG) BY MOUTH AT BEDTIME 90 capsule 3   No current facility-administered medications for this visit.    PHYSICAL EXAMINATION: ECOG PERFORMANCE STATUS: {CHL ONC ECOG [phone]}  There were no vitals filed for this visit. There were no vitals filed for this visit.  BREAST:*** No palpable masses or nodules in either right or left breasts. No palpable axillary supraclavicular or infraclavicular adenopathy no breast tenderness or nipple discharge. (exam performed in the presence of a chaperone)  LABORATORY DATA:  I have reviewed the data as listed    Latest Ref Rng & Units 01/31/2023   11:08 AM 01/04/2023    2:11 PM 12/14/2022    1:55 PM  CMP  Glucose 70 - 99  mg/dL 478  295  621   BUN 6 - 20 mg/dL 8  8  6    Creatinine 0.44 - 1.00 mg/dL 3.08  6.57  8.46   Sodium 135 - 145 mmol/L 141  142  142   Potassium 3.5 - 5.1 mmol/L 4.3  4.0  3.9   Chloride 98 - 111 mmol/L 106  106  107   CO2 22 - 32 mmol/L 29  28  29    Calcium 8.9 - 10.3 mg/dL 9.5  8.8  8.5   Total Protein 6.5 - 8.1 g/dL 7.1  6.6  6.4   Total Bilirubin 0.3 - 1.2 mg/dL 0.4  0.4  0.3   Alkaline Phos 38 - 126 U/L 83  85  101   AST 15 - 41 U/L 32  27  26   ALT 0 - 44 U/L 40  35  37     Lab Results  Component Value Date   WBC 3.4 (L) 01/31/2023   HGB 12.7 01/31/2023   HCT 37.7 01/31/2023   MCV 95.7 01/31/2023   PLT 236 01/31/2023   NEUTROABS 1.9 01/31/2023    ASSESSMENT & PLAN:  No problem-specific Assessment & Plan notes found for this encounter.    No orders of the defined types were  placed in this encounter.  The patient has a good understanding of the overall plan. she agrees with it. she will call with any problems that may develop before the next visit here. Total time spent: 30 mins including face to face time and time spent for planning, charting and co-ordination of care   Sherlyn Lick, CMA 02/19/23    I Janan Ridge am acting as a Neurosurgeon for [employer]  ***

## 2023-02-21 ENCOUNTER — Inpatient Hospital Stay: Payer: Commercial Managed Care - PPO | Attending: Hematology and Oncology

## 2023-02-21 ENCOUNTER — Inpatient Hospital Stay: Payer: Commercial Managed Care - PPO

## 2023-02-21 ENCOUNTER — Inpatient Hospital Stay: Payer: Commercial Managed Care - PPO | Admitting: Hematology and Oncology

## 2023-02-21 VITALS — BP 132/82 | HR 78 | Temp 97.7°F | Resp 18 | Ht 64.0 in | Wt 221.8 lb

## 2023-02-21 VITALS — BP 116/73 | HR 78 | Resp 17

## 2023-02-21 DIAGNOSIS — Z7952 Long term (current) use of systemic steroids: Secondary | ICD-10-CM | POA: Diagnosis not present

## 2023-02-21 DIAGNOSIS — Z7982 Long term (current) use of aspirin: Secondary | ICD-10-CM | POA: Diagnosis not present

## 2023-02-21 DIAGNOSIS — Z9221 Personal history of antineoplastic chemotherapy: Secondary | ICD-10-CM | POA: Insufficient documentation

## 2023-02-21 DIAGNOSIS — Z170 Estrogen receptor positive status [ER+]: Secondary | ICD-10-CM | POA: Insufficient documentation

## 2023-02-21 DIAGNOSIS — I700 Atherosclerosis of aorta: Secondary | ICD-10-CM | POA: Insufficient documentation

## 2023-02-21 DIAGNOSIS — K760 Fatty (change of) liver, not elsewhere classified: Secondary | ICD-10-CM | POA: Insufficient documentation

## 2023-02-21 DIAGNOSIS — C50112 Malignant neoplasm of central portion of left female breast: Secondary | ICD-10-CM

## 2023-02-21 DIAGNOSIS — Z85820 Personal history of malignant melanoma of skin: Secondary | ICD-10-CM | POA: Insufficient documentation

## 2023-02-21 DIAGNOSIS — Z5112 Encounter for antineoplastic immunotherapy: Secondary | ICD-10-CM | POA: Insufficient documentation

## 2023-02-21 DIAGNOSIS — C7951 Secondary malignant neoplasm of bone: Secondary | ICD-10-CM | POA: Diagnosis not present

## 2023-02-21 DIAGNOSIS — R5383 Other fatigue: Secondary | ICD-10-CM | POA: Insufficient documentation

## 2023-02-21 DIAGNOSIS — C50919 Malignant neoplasm of unspecified site of unspecified female breast: Secondary | ICD-10-CM

## 2023-02-21 DIAGNOSIS — Z79811 Long term (current) use of aromatase inhibitors: Secondary | ICD-10-CM | POA: Diagnosis not present

## 2023-02-21 DIAGNOSIS — Z923 Personal history of irradiation: Secondary | ICD-10-CM | POA: Insufficient documentation

## 2023-02-21 DIAGNOSIS — Z95828 Presence of other vascular implants and grafts: Secondary | ICD-10-CM

## 2023-02-21 DIAGNOSIS — Z79899 Other long term (current) drug therapy: Secondary | ICD-10-CM | POA: Insufficient documentation

## 2023-02-21 LAB — CMP (CANCER CENTER ONLY)
ALT: 37 U/L (ref 0–44)
AST: 27 U/L (ref 15–41)
Albumin: 4.2 g/dL (ref 3.5–5.0)
Alkaline Phosphatase: 90 U/L (ref 38–126)
Anion gap: 9 (ref 5–15)
BUN: 9 mg/dL (ref 6–20)
CO2: 28 mmol/L (ref 22–32)
Calcium: 9.4 mg/dL (ref 8.9–10.3)
Chloride: 103 mmol/L (ref 98–111)
Creatinine: 0.62 mg/dL (ref 0.44–1.00)
GFR, Estimated: >60 mL/min (ref >60)
Glucose, Bld: 91 mg/dL (ref 70–99)
Potassium: 4 mmol/L (ref 3.5–5.1)
Sodium: 140 mmol/L (ref 135–145)
Total Bilirubin: 0.5 mg/dL (ref 0.3–1.2)
Total Protein: 7.1 g/dL (ref 6.5–8.1)

## 2023-02-21 LAB — CBC WITH DIFFERENTIAL (CANCER CENTER ONLY)
Abs Immature Granulocytes: 0.01 10*3/uL (ref 0.00–0.07)
Basophils Absolute: 0 10*3/uL (ref 0.0–0.1)
Basophils Relative: 1 %
Eosinophils Absolute: 0.2 10*3/uL (ref 0.0–0.5)
Eosinophils Relative: 7 %
HCT: 36.9 % (ref 36.0–46.0)
Hemoglobin: 12.7 g/dL (ref 12.0–15.0)
Immature Granulocytes: 0 %
Lymphocytes Relative: 35 %
Lymphs Abs: 1.2 10*3/uL (ref 0.7–4.0)
MCH: 32.3 pg (ref 26.0–34.0)
MCHC: 34.4 g/dL (ref 30.0–36.0)
MCV: 93.9 fL (ref 80.0–100.0)
Monocytes Absolute: 0.3 10*3/uL (ref 0.1–1.0)
Monocytes Relative: 9 %
Neutro Abs: 1.7 10*3/uL (ref 1.7–7.7)
Neutrophils Relative %: 48 %
Platelet Count: 276 10*3/uL (ref 150–400)
RBC: 3.93 MIL/uL (ref 3.87–5.11)
RDW: 13.7 % (ref 11.5–15.5)
WBC Count: 3.4 10*3/uL — ABNORMAL LOW (ref 4.0–10.5)
nRBC: 0 % (ref 0.0–0.2)

## 2023-02-21 MED ORDER — DIPHENHYDRAMINE HCL 25 MG PO CAPS
25.0000 mg | ORAL_CAPSULE | Freq: Once | ORAL | Status: AC
  Administered 2023-02-21: 25 mg via ORAL
  Filled 2023-02-21: qty 1

## 2023-02-21 MED ORDER — SODIUM CHLORIDE 0.9% FLUSH
10.0000 mL | INTRAVENOUS | Status: DC | PRN
  Administered 2023-02-21: 10 mL

## 2023-02-21 MED ORDER — DEXTROSE 5 % IV SOLN: Freq: Once | INTRAVENOUS | Status: AC

## 2023-02-21 MED ORDER — FAM-TRASTUZUMAB DERUXTECAN-NXKI CHEMO 100 MG IV SOLR
3.2000 mg/kg | Freq: Once | INTRAVENOUS | Status: AC
  Administered 2023-02-21: 300 mg via INTRAVENOUS
  Filled 2023-02-21: qty 15

## 2023-02-21 MED ORDER — ACETAMINOPHEN 325 MG PO TABS
650.0000 mg | ORAL_TABLET | Freq: Once | ORAL | Status: AC
  Administered 2023-02-21: 650 mg via ORAL
  Filled 2023-02-21: qty 2

## 2023-02-21 MED ORDER — SODIUM CHLORIDE 0.9 % IV SOLN
10.0000 mg | Freq: Once | INTRAVENOUS | Status: AC
  Administered 2023-02-21: 10 mg via INTRAVENOUS
  Filled 2023-02-21: qty 10

## 2023-02-21 MED ORDER — HEPARIN SOD (PORK) LOCK FLUSH 100 UNIT/ML IV SOLN
500.0000 [IU] | Freq: Once | INTRAVENOUS | Status: AC | PRN
  Administered 2023-02-21: 500 [IU]

## 2023-02-21 MED ORDER — PALONOSETRON HCL INJECTION 0.25 MG/5ML
0.2500 mg | Freq: Once | INTRAVENOUS | Status: AC
  Administered 2023-02-21: 0.25 mg via INTRAVENOUS
  Filled 2023-02-21: qty 5

## 2023-02-21 MED ORDER — SODIUM CHLORIDE 0.9% FLUSH
10.0000 mL | Freq: Once | INTRAVENOUS | Status: AC
  Administered 2023-02-21: 10 mL

## 2023-02-21 MED ORDER — SODIUM CHLORIDE 0.9 % IV SOLN
150.0000 mg | Freq: Once | INTRAVENOUS | Status: AC
  Administered 2023-02-21: 150 mg via INTRAVENOUS
  Filled 2023-02-21: qty 150

## 2023-02-21 NOTE — Assessment & Plan Note (Signed)
08/22/2019: PET CT scan: Metastatic disease in the chest with multiple lung nodules all subcentimeter size and hypermetabolic lymph nodes (right paratracheal node 7 mm, subcarinal node 1 cm, right hilar node 6 mm), retroperitoneal and upper pelvic common iliac lymph nodes, bone metastases especially L5, L4, T6, right 10th rib, left proximal femur   Bone biopsy was not felt to be reliable in addition to the effect of radiation on the bone. Palliative radiation completed 09/03/2019 [facility]: BRCA1 mutation, TMB 5.36, MSI: Normal   Prior treatment: Ibrance with letrozole and Xgeva for bone metastases switched to abemaciclib and Fulvestrant   Bone metastasis: Patient does not want to take bisphosphonates CT CAP 08/23/2022: New and enlarged pulmonary nodule consistent with progression.  Enlarged small low cervical and mediastinal nodes.  Multiple bone metastases minimally progressive.  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  Skull metastases, abnormal enhancement left globe  ---------------------------------------------------------------- Current treatment:: Enhertu cycle 6 Enhertu toxicities: Severe flu after the first cycle of chemo and she had nausea vomiting and lost 10 pounds. Fatigue ANC 1.6 today    CT CAP: 12/29/2022: Interval decrease in size of supraclavicular, mediastinal, abdominal, lung nodules, liver metastases and bone metastases. 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  Other scattered foci of brain enhancement unchanged resolved left posterior globe  metastases  Our plan is to finish 6 cycles of chemo and obtain a CT scan in the first week of August. Return to clinic in 3 weeks for cycle 7

## 2023-03-09 ENCOUNTER — Encounter (HOSPITAL_COMMUNITY): Payer: Self-pay

## 2023-03-09 ENCOUNTER — Ambulatory Visit (HOSPITAL_COMMUNITY)
Admission: RE | Admit: 2023-03-09 | Discharge: 2023-03-09 | Disposition: A | Discharge status: Home / Self Care | Payer: Commercial Managed Care - PPO | Source: Ambulatory Visit | Attending: Hematology and Oncology | Admitting: Hematology and Oncology

## 2023-03-09 DIAGNOSIS — Z170 Estrogen receptor positive status [ER+]: Secondary | ICD-10-CM | POA: Insufficient documentation

## 2023-03-09 DIAGNOSIS — C50112 Malignant neoplasm of central portion of left female breast: Secondary | ICD-10-CM | POA: Insufficient documentation

## 2023-03-09 DIAGNOSIS — C50919 Malignant neoplasm of unspecified site of unspecified female breast: Secondary | ICD-10-CM | POA: Insufficient documentation

## 2023-03-09 MED ORDER — SODIUM CHLORIDE (PF) 0.9 % IJ SOLN
INTRAMUSCULAR | Status: AC
  Filled 2023-03-09: qty 50

## 2023-03-09 MED ORDER — IOHEXOL 300 MG/ML  SOLN
100.0000 mL | Freq: Once | INTRAMUSCULAR | Status: AC | PRN
  Administered 2023-03-09: 100 mL via INTRAVENOUS

## 2023-03-13 ENCOUNTER — Other Ambulatory Visit: Payer: Self-pay

## 2023-03-13 DIAGNOSIS — Z170 Estrogen receptor positive status [ER+]: Secondary | ICD-10-CM

## 2023-03-13 NOTE — Progress Notes (Signed)
Patient Care Team: Patient, [name] Serena Croissant, MD as Medical Oncologist (Hematology and Oncology) [name], MD as Consulting Physician (Radiation Oncology) [name], NP as Nurse Practitioner (Hematology and Oncology) [name], MD as Consulting Physician (General Surgery) [name], RPH-CPP as Pharmacist (Hematology and Oncology)  DIAGNOSIS: No diagnosis found.  SUMMARY OF ONCOLOGIC HISTORY: Oncology History  Cancer of central portion of left female breast (HCC)  04/02/2014 Genetic Testing   BRCA1 [id number] pathogenic mutation identified on gene sequce and del/dup analyses of BRCA1 and BRCA2.  BRCA1 p.H1283R VUS identified as well.  The report date is 04/02/2014.  UPDATE: BRCA1 Y.Q0347Q VUS has been reclassified as a likely benign variant.  The reclassification date is 04/21/2020.   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 N0 M0 stage IA: Oncotype DX 13 (ROR 9%)   04/16/2014 Surgery   Left upper back melanoma resected no residual melanoma identified on re-resection margins negative   05/26/2014 - 06/08/2015 Anti-estrogen oral therapy   Tamoxifen 20 mg daily with a plan to switch her to aromatase inhibitors once she gets oophorectomy ( patient did not continue antiestrogen therapy by choice)   08/13/2014 Surgery   oophorectomy with total abdominal hysterectomy   09/08/2015 -  Anti-estrogen oral therapy   Resumed anastrozole after she found recurrence of breast cancer; stopped in January 2018, started letrozole 2.5 mg daily 10/12/2016; switched to Exemestane 25 mg daily on 01/03/17   09/16/2015 Surgery   Skin, left: IDC grade 3; 0.5 cm, margins negative, ER 95%, PR 10%, HER-2 positive ratio 2.08   11/08/2015 - 02/2017 Chemotherapy    Herceptin every 3 weeks plus anastrozole   11/24/2015 - 01/14/2016 Radiation Therapy   Adjuvant radiation therapy by Dr. Roselind Messier   01/2017 -  Anti-estrogen oral therapy   Exemestane daily (stopped others due to side effects), due to muscle aches and pains switch to tamoxifen 5 mg daily starting 12/18/2017   08/07/2019 Relapse/Recurrence   Bone scan: Sclerotic metastatic lesions involving left femoral neck, right ninth rib and entire L5 vertebra; MRI left hip: Diffuse signal abnormality L5 vertebral body with possible extraosseous extension   08/22/2019 - 09/03/2019 Radiation Therapy   Palliative radiation with Dr. Roselind Messier to L5 and left proixmal femur.    10/2021 Treatment Plan Change   Fulvestrant and Verzenio   08/23/2022 Imaging   CT CAP 08/23/2022: New and enlarged pulmonary nodule consistent with progression.  Enlarged small low cervical and mediastinal nodes.  Multiple bone metastases minimally progressive.  Liver lesions evaluation is challenging because of hepatic steatosis with suspected liver progression, isolated tiny omental nodule     09/07/2022 PET scan   IMPRESSION: 1. Worsening of disease now with nodal, pulmonary and hepatic metastases. 2. Areas of variable FDG uptake with some new bone lesions and some areas of bony metastasis which are more focal, for instance in the pelvis where there was likely diffuse involvement on prior imaging. Many of the areas of sclerosis do not show substantial uptake as outlined above and some areas show improvement/resolution of FDG uptake seen previously but there are signs of active bony metastatic disease currently. 3. Likely LEFT adrenal adenoma. 4. Severe hepatic steatosis. 5. Aortic atherosclerosis.   Primary malignant neoplasm of breast with metastasis (HCC)  08/25/2019 Initial Diagnosis   Primary  malignant neoplasm of breast with metastasis (HCC)   10/11/2022 -  Chemotherapy   Patient is on Treatment Plan : BREAST METASTATIC  Fam-Trastuzumab Deruxtecan-nxki (Enhertu) (5.4) q21d       CHIEF COMPLIANT: Follow-up Enhertu cycle 7  INTERVAL HISTORY: Krystal [name] is a 57 year old above-mentioned cement site breast cancer. Currently on Enhertu. She presents to the clinic for a follow-up.    ALLERGIES:  is allergic to dilaudid [hydromorphone] and codeine.  MEDICATIONS:  Current Outpatient Medications  Medication Sig Dispense Refill   aspirin EC 81 MG tablet Take 1 tablet (81 mg total) by mouth daily. Swallow whole. 90 tablet 3   aspirin-acetaminophen-caffeine (EXCEDRIN MIGRAINE) 250-250-65 MG tablet Take 2 tablets by mouth every 6 (six) hours as needed for headache.     CALCIUM PO Take 1,200 mg by mouth 2 (two) times daily.     dexamethasone (DECADRON) 4 MG tablet Take 1 tablets (4 mg) by mouth daily for 3 days after chemotherapy. Take with food. 30 tablet 1   dicyclomine (BENTYL) 20 MG tablet Take 1 tablet (20 mg total) by mouth every 6 (six) hours as needed for up to 7 days for spasms. 28 tablet 0   esomeprazole (NEXIUM) 40 MG capsule Take 40 mg by mouth daily.     lidocaine-prilocaine (EMLA) cream Apply to affected area once 30 g 3   loperamide (IMODIUM) 2 MG capsule Take 2 mg by mouth daily as needed for diarrhea or loose stools. Take 2 tablets at first diarrhea and then 1 tablet after additional loose stool. Max number in 24 hours is 8 tablets.     loratadine (CLARITIN) 10 MG tablet Take 10 mg by mouth daily.     meloxicam (MOBIC) 15 MG tablet TAKE 1 TABLET(15 MG) BY MOUTH DAILY AS NEEDED FOR PAIN 30 tablet 1   ondansetron (ZOFRAN) 8 MG tablet Take 1 tablet (8 mg total) by mouth every 8 (eight) hours as needed for nausea or vomiting. Start on the third day after chemotherapy. 30 tablet 1   prochlorperazine (COMPAZINE) 10 MG tablet Take 1 tablet (10 mg total) by mouth every 6 (six) hours as needed for nausea or vomiting. 30 tablet 1   promethazine (PHENERGAN) 25 MG suppository Place 1 suppository (25 mg total)  rectally every 6 (six) hours as needed for nausea or vomiting. 12 each 0   rosuvastatin (CRESTOR) 10 MG tablet Take 1 tablet (10 mg total) by mouth daily. 90 tablet 3   traMADol (ULTRAM) 50 MG tablet Take 1 tablet (50 mg total) by mouth every 6 (six) hours as needed. 10 tablet 0   traZODone (DESYREL) 50 MG tablet TAKE 1 TABLET(50 MG) BY MOUTH AT BEDTIME 90 tablet 0   venlafaxine XR (EFFEXOR-XR) 75 MG 24 hr capsule TAKE 1 CAPSULE(75 MG) BY MOUTH AT BEDTIME 90 capsule 3   No current facility-administered medications for this visit.    PHYSICAL EXAMINATION: ECOG PERFORMANCE STATUS: {CHL ONC ECOG [phone]}  There were no vitals filed for this visit. There were no vitals filed for this visit.  BREAST:*** No palpable masses or nodules in either right or left breasts. No palpable axillary supraclavicular or infraclavicular adenopathy no breast tenderness or nipple discharge. (exam performed in the presence of a chaperone)  LABORATORY DATA:  I have reviewed the data as listed    Latest Ref Rng & Units 02/21/2023    2:25 PM 01/31/2023   11:08 AM 01/04/2023    2:11 PM  CMP  Glucose 70 - 99  mg/dL 91  621  308   BUN 6 - 20 mg/dL 9  8  8    Creatinine 0.44 - 1.00 mg/dL 6.57  8.46  9.62   Sodium 135 - 145 mmol/L 140  141  142   Potassium 3.5 - 5.1 mmol/L 4.0  4.3  4.0   Chloride 98 - 111 mmol/L 103  106  106   CO2 22 - 32 mmol/L 28  29  28    Calcium 8.9 - 10.3 mg/dL 9.4  9.5  8.8   Total Protein 6.5 - 8.1 g/dL 7.1  7.1  6.6   Total Bilirubin 0.3 - 1.2 mg/dL 0.5  0.4  0.4   Alkaline Phos 38 - 126 U/L 90  83  85   AST 15 - 41 U/L 27  32  27   ALT 0 - 44 U/L 37  40  35     Lab Results  Component Value Date   WBC 3.4 (L) 02/21/2023   HGB 12.7 02/21/2023   HCT 36.9 02/21/2023   MCV 93.9 02/21/2023   PLT 276 02/21/2023   NEUTROABS 1.7 02/21/2023    ASSESSMENT & PLAN:  No problem-specific Assessment & Plan notes found for this encounter.    No orders of the defined types were  placed in this encounter.  The patient has a good understanding of the overall plan. she agrees with it. she will call with any problems that may develop before the next visit here. Total time spent: 30 mins including face to face time and time spent for planning, charting and co-ordination of care   Sherlyn Lick, CMA 03/13/23    I Janan Ridge am acting as a Neurosurgeon for [employer]  ***

## 2023-03-14 ENCOUNTER — Other Ambulatory Visit: Payer: Self-pay | Admitting: Hematology and Oncology

## 2023-03-14 DIAGNOSIS — C50919 Malignant neoplasm of unspecified site of unspecified female breast: Secondary | ICD-10-CM

## 2023-03-15 ENCOUNTER — Inpatient Hospital Stay: Payer: Commercial Managed Care - PPO | Attending: Hematology and Oncology

## 2023-03-15 ENCOUNTER — Other Ambulatory Visit: Payer: Self-pay

## 2023-03-15 ENCOUNTER — Inpatient Hospital Stay: Payer: Commercial Managed Care - PPO

## 2023-03-15 ENCOUNTER — Inpatient Hospital Stay: Payer: Commercial Managed Care - PPO | Admitting: Hematology and Oncology

## 2023-03-15 VITALS — BP 125/74 | HR 83 | Temp 97.7°F | Resp 18 | Ht 64.0 in | Wt 223.8 lb

## 2023-03-15 DIAGNOSIS — Z9013 Acquired absence of bilateral breasts and nipples: Secondary | ICD-10-CM | POA: Insufficient documentation

## 2023-03-15 DIAGNOSIS — Z1501 Genetic susceptibility to malignant neoplasm of breast: Secondary | ICD-10-CM | POA: Insufficient documentation

## 2023-03-15 DIAGNOSIS — C50112 Malignant neoplasm of central portion of left female breast: Secondary | ICD-10-CM

## 2023-03-15 DIAGNOSIS — K760 Fatty (change of) liver, not elsewhere classified: Secondary | ICD-10-CM | POA: Diagnosis not present

## 2023-03-15 DIAGNOSIS — Z7982 Long term (current) use of aspirin: Secondary | ICD-10-CM | POA: Insufficient documentation

## 2023-03-15 DIAGNOSIS — C7951 Secondary malignant neoplasm of bone: Secondary | ICD-10-CM | POA: Diagnosis present

## 2023-03-15 DIAGNOSIS — Z923 Personal history of irradiation: Secondary | ICD-10-CM | POA: Diagnosis not present

## 2023-03-15 DIAGNOSIS — Z5112 Encounter for antineoplastic immunotherapy: Secondary | ICD-10-CM | POA: Diagnosis not present

## 2023-03-15 DIAGNOSIS — Z5181 Encounter for therapeutic drug level monitoring: Secondary | ICD-10-CM

## 2023-03-15 DIAGNOSIS — Z170 Estrogen receptor positive status [ER+]: Secondary | ICD-10-CM | POA: Insufficient documentation

## 2023-03-15 DIAGNOSIS — C50919 Malignant neoplasm of unspecified site of unspecified female breast: Secondary | ICD-10-CM

## 2023-03-15 DIAGNOSIS — I700 Atherosclerosis of aorta: Secondary | ICD-10-CM | POA: Insufficient documentation

## 2023-03-15 DIAGNOSIS — Z85820 Personal history of malignant melanoma of skin: Secondary | ICD-10-CM | POA: Insufficient documentation

## 2023-03-15 DIAGNOSIS — Z79899 Other long term (current) drug therapy: Secondary | ICD-10-CM | POA: Insufficient documentation

## 2023-03-15 LAB — CMP (CANCER CENTER ONLY)
ALT: 44 U/L (ref 0–44)
AST: 34 U/L (ref 15–41)
Albumin: 4.1 g/dL (ref 3.5–5.0)
Alkaline Phosphatase: 84 U/L (ref 38–126)
Anion gap: 5 (ref 5–15)
BUN: 9 mg/dL (ref 6–20)
CO2: 31 mmol/L (ref 22–32)
Calcium: 8.8 mg/dL — ABNORMAL LOW (ref 8.9–10.3)
Chloride: 104 mmol/L (ref 98–111)
Creatinine: 0.65 mg/dL (ref 0.44–1.00)
GFR, Estimated: >60 mL/min (ref >60)
Glucose, Bld: 106 mg/dL — ABNORMAL HIGH (ref 70–99)
Potassium: 4.1 mmol/L (ref 3.5–5.1)
Sodium: 140 mmol/L (ref 135–145)
Total Bilirubin: 0.4 mg/dL (ref 0.3–1.2)
Total Protein: 7 g/dL (ref 6.5–8.1)

## 2023-03-15 LAB — CBC WITH DIFFERENTIAL (CANCER CENTER ONLY)
Abs Immature Granulocytes: 0.01 10*3/uL (ref 0.00–0.07)
Basophils Absolute: 0 10*3/uL (ref 0.0–0.1)
Basophils Relative: 1 %
Eosinophils Absolute: 0.2 10*3/uL (ref 0.0–0.5)
Eosinophils Relative: 6 %
HCT: 37 % (ref 36.0–46.0)
Hemoglobin: 12.4 g/dL (ref 12.0–15.0)
Immature Granulocytes: 0 %
Lymphocytes Relative: 32 %
Lymphs Abs: 1.1 10*3/uL (ref 0.7–4.0)
MCH: 31.9 pg (ref 26.0–34.0)
MCHC: 33.5 g/dL (ref 30.0–36.0)
MCV: 95.1 fL (ref 80.0–100.0)
Monocytes Absolute: 0.4 10*3/uL (ref 0.1–1.0)
Monocytes Relative: 13 %
Neutro Abs: 1.6 10*3/uL — ABNORMAL LOW (ref 1.7–7.7)
Neutrophils Relative %: 48 %
Platelet Count: 302 10*3/uL (ref 150–400)
RBC: 3.89 MIL/uL (ref 3.87–5.11)
RDW: 14.8 % (ref 11.5–15.5)
WBC Count: 3.3 10*3/uL — ABNORMAL LOW (ref 4.0–10.5)
nRBC: 0 % (ref 0.0–0.2)

## 2023-03-15 MED ORDER — ACETAMINOPHEN 325 MG PO TABS
650.0000 mg | ORAL_TABLET | Freq: Once | ORAL | Status: AC
  Administered 2023-03-15: 650 mg via ORAL
  Filled 2023-03-15: qty 2

## 2023-03-15 MED ORDER — DEXTROSE 5 % IV SOLN: Freq: Once | INTRAVENOUS | Status: AC

## 2023-03-15 MED ORDER — PALONOSETRON HCL INJECTION 0.25 MG/5ML
0.2500 mg | Freq: Once | INTRAVENOUS | Status: AC
  Administered 2023-03-15: 0.25 mg via INTRAVENOUS
  Filled 2023-03-15: qty 5

## 2023-03-15 MED ORDER — SODIUM CHLORIDE 0.9 % IV SOLN
10.0000 mg | Freq: Once | INTRAVENOUS | Status: AC
  Administered 2023-03-15: 10 mg via INTRAVENOUS
  Filled 2023-03-15: qty 10

## 2023-03-15 MED ORDER — ACETAMINOPHEN 325 MG PO TABS
ORAL_TABLET | ORAL | Status: AC
  Filled 2023-03-15: qty 1

## 2023-03-15 MED ORDER — DIPHENHYDRAMINE HCL 25 MG PO CAPS
25.0000 mg | ORAL_CAPSULE | Freq: Once | ORAL | Status: AC
  Administered 2023-03-15: 25 mg via ORAL
  Filled 2023-03-15: qty 1

## 2023-03-15 MED ORDER — HEPARIN SOD (PORK) LOCK FLUSH 100 UNIT/ML IV SOLN
500.0000 [IU] | Freq: Once | INTRAVENOUS | Status: AC | PRN
  Administered 2023-03-15: 500 [IU]

## 2023-03-15 MED ORDER — FAM-TRASTUZUMAB DERUXTECAN-NXKI CHEMO 100 MG IV SOLR
3.2000 mg/kg | Freq: Once | INTRAVENOUS | Status: AC
  Administered 2023-03-15: 300 mg via INTRAVENOUS
  Filled 2023-03-15: qty 15

## 2023-03-15 MED ORDER — SODIUM CHLORIDE 0.9% FLUSH
10.0000 mL | INTRAVENOUS | Status: DC | PRN
  Administered 2023-03-15: 10 mL

## 2023-03-15 MED ORDER — SODIUM CHLORIDE 0.9 % IV SOLN
150.0000 mg | Freq: Once | INTRAVENOUS | Status: AC
  Administered 2023-03-15: 150 mg via INTRAVENOUS
  Filled 2023-03-15: qty 150

## 2023-03-15 NOTE — Assessment & Plan Note (Addendum)
08/22/2019: PET CT scan: Metastatic disease in the chest with multiple lung nodules all subcentimeter size and hypermetabolic lymph nodes (right paratracheal node 7 mm, subcarinal node 1 cm, right hilar node 6 mm), retroperitoneal and upper pelvic common iliac lymph nodes, bone metastases especially L5, L4, T6, right 10th rib, left proximal femur   Bone biopsy was not felt to be reliable in addition to the effect of radiation on the bone. Palliative radiation completed 09/03/2019 [facility]: BRCA1 mutation, TMB 5.36, MSI: Normal   Prior treatment: Ibrance with letrozole and Xgeva for bone metastases switched to abemaciclib and Fulvestrant   Bone metastasis: Patient does not want to take bisphosphonates CT CAP 08/23/2022: New and enlarged pulmonary nodule consistent with progression.  Enlarged small low cervical and mediastinal nodes.  Multiple bone metastases minimally progressive.  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  Skull metastases, abnormal enhancement left globe  ---------------------------------------------------------------- Current treatment:: Enhertu cycle 7 Enhertu toxicities: Fatigue ANC 1.6 today: Okay to treat   CT CAP: 12/29/2022: Interval decrease in size of supraclavicular, mediastinal, abdominal, lung nodules, liver metastases and bone metastases.  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  Other scattered foci of brain enhancement unchanged resolved left posterior globe metastases CT CAP 03/12/2023: Improvement in pulmonary metastases.  No  thoracic lymphadenopathy.  Similar hepatic lesions   Radiology review: Based upon the above CT scan result, we will continue with Enhertu.  She is hoping that the brain MRI also shows improvement. Return to clinic in 3 weeks for cycle 8

## 2023-03-15 NOTE — Progress Notes (Signed)
Patient seen by Dr. Florene Glen reviewed: ANC 1.6  Per physician team, patient is ready for treatment and there are NO modifications to the treatment plan.

## 2023-03-15 NOTE — Patient Instructions (Signed)
[name]  Discharge Instructions: Thank you for choosing [facility] to provide your oncology and hematology care.   If you have a lab appointment with the Cancer Center, please go directly to the Cancer Center and check in at the registration area.   Wear comfortable clothing and clothing appropriate for easy access to any Portacath or PICC line.   We strive to give you quality time with your provider. You may need to reschedule your appointment if you arrive late (15 or more minutes).  Arriving late affects you and other patients whose appointments are after yours.  Also, if you miss three or more appointments without notifying the office, you may be dismissed from the clinic at the provider's discretion.      For prescription refill requests, have your pharmacy contact our office and allow 72 hours for refills to be completed.    Today you received the following chemotherapy and/or immunotherapy agents Enhertu.   To help prevent nausea and vomiting after your treatment, we encourage you to take your nausea medication as directed.  BELOW ARE SYMPTOMS THAT SHOULD BE REPORTED IMMEDIATELY: *FEVER GREATER THAN 100.4 F (38 C) OR HIGHER *CHILLS OR SWEATING *NAUSEA AND VOMITING THAT IS NOT CONTROLLED WITH YOUR NAUSEA MEDICATION *UNUSUAL SHORTNESS OF BREATH *UNUSUAL BRUISING OR BLEEDING *URINARY PROBLEMS (pain or burning when urinating, or frequent urination) *BOWEL PROBLEMS (unusual diarrhea, constipation, pain near the anus) TENDERNESS IN MOUTH AND THROAT WITH OR WITHOUT PRESENCE OF ULCERS (sore throat, sores in mouth, or a toothache) UNUSUAL RASH, SWELLING OR PAIN  UNUSUAL VAGINAL DISCHARGE OR ITCHING   Items with * indicate a potential emergency and should be followed up as soon as possible or go to the Emergency Department if any problems should occur.  Please show the CHEMOTHERAPY ALERT CARD or IMMUNOTHERAPY ALERT CARD at check-in  to the Emergency Department and triage nurse.  Should you have questions after your visit or need to cancel or reschedule your appointment, please contact [facility]  Dept: [phone]  and follow the prompts.  Office hours are 8:00 a.m. to 4:30 p.m. Monday - Friday. Please note that voicemails left after 4:00 p.m. may not be returned until the following business day.  We are closed weekends and major holidays. You have access to a nurse at all times for urgent questions. Please call the main number to the clinic Dept: [phone] and follow the prompts.   For any non-urgent questions, you may also contact your provider using MyChart. We now offer e-Visits for anyone 18 and older to request care online for non-urgent symptoms. For details visit [email or web address].   Also download the MyChart app! Go to the app store, search "MyChart", open the app, select [facility], and log in with your MyChart username and password.   

## 2023-03-15 NOTE — Progress Notes (Signed)
Per Serena Croissant, MD, OK to treat using April 2024 ECHO.

## 2023-03-19 ENCOUNTER — Telehealth: Payer: Self-pay | Admitting: Hematology and Oncology

## 2023-03-19 NOTE — Telephone Encounter (Signed)
Left patient a message in regards to follow up appointment times/dates

## 2023-03-20 ENCOUNTER — Other Ambulatory Visit: Payer: Self-pay

## 2023-03-23 ENCOUNTER — Ambulatory Visit (HOSPITAL_COMMUNITY): Payer: Commercial Managed Care - PPO

## 2023-03-25 ENCOUNTER — Other Ambulatory Visit: Payer: Self-pay

## 2023-03-27 ENCOUNTER — Telehealth: Payer: Self-pay | Admitting: Hematology and Oncology

## 2023-03-27 NOTE — Telephone Encounter (Signed)
Rescheduled appointments per patients request. Patient is aware of the changes made to her upcoming appointments.

## 2023-03-28 ENCOUNTER — Other Ambulatory Visit: Payer: Self-pay

## 2023-04-03 ENCOUNTER — Ambulatory Visit (HOSPITAL_COMMUNITY)
Admission: RE | Admit: 2023-04-03 | Discharge: 2023-04-03 | Disposition: A | Discharge status: Home / Self Care | Payer: Commercial Managed Care - PPO | Source: Ambulatory Visit | Attending: Hematology and Oncology | Admitting: Hematology and Oncology

## 2023-04-03 DIAGNOSIS — C50112 Malignant neoplasm of central portion of left female breast: Secondary | ICD-10-CM | POA: Diagnosis not present

## 2023-04-03 DIAGNOSIS — Z01818 Encounter for other preprocedural examination: Secondary | ICD-10-CM | POA: Diagnosis present

## 2023-04-03 DIAGNOSIS — I351 Nonrheumatic aortic (valve) insufficiency: Secondary | ICD-10-CM | POA: Diagnosis not present

## 2023-04-03 DIAGNOSIS — Z170 Estrogen receptor positive status [ER+]: Secondary | ICD-10-CM | POA: Diagnosis not present

## 2023-04-03 DIAGNOSIS — Z5181 Encounter for therapeutic drug level monitoring: Secondary | ICD-10-CM | POA: Insufficient documentation

## 2023-04-03 DIAGNOSIS — Z0189 Encounter for other specified special examinations: Secondary | ICD-10-CM

## 2023-04-03 DIAGNOSIS — E119 Type 2 diabetes mellitus without complications: Secondary | ICD-10-CM | POA: Insufficient documentation

## 2023-04-03 LAB — ECHOCARDIOGRAM COMPLETE
AV Vena cont: 0.2 cm
Area-P 1/2: 2.48 cm2
Calc EF: 55.1 %
P 1/2 time: 421 msec
S' Lateral: 3.2 cm
Single Plane A2C EF: 54.1 %
Single Plane A4C EF: 55.5 %

## 2023-04-03 NOTE — Progress Notes (Addendum)
Krystal [name] presents today to discuss MRI results with Dr. Basilio Cairo. She has not received radiation treatment to her brain. Pt is to have an MRI on 04-13-23.   Recent neurologic symptoms, if any:  Seizures: no Headaches: yes, headaches with weather changes, easy to manage if caught early Nausea: yes, if someone wears strong perfume, etc., will gag or actually throw up, occurs with food as well at times Dizziness/ataxia: yes, intermittent, from lack of sleep Difficulty with hand coordination: no Focal numbness/weakness: no Visual deficits/changes: yes, has improved, left eye symptoms improved  Confusion/Memory deficits: yes, some memory issues with little things at times. Reports trouble getting words out.   Other issues of note: No concerns at this time. Pt has already read the the test results. She saw nothing new on scan.   MR Brain W Wo Contrast 04/13/2023  IMPRESSION: 1. Unchanged leptomeningeal enhancement along the right central sulcus and pachymeningeal enhancement overlying the right superior frontal gyrus. 2. Unchanged sclerotic calvarial metastases. 3. No new intracranial metastases.

## 2023-04-03 NOTE — Progress Notes (Signed)
Echocardiogram 2D Echocardiogram has been performed.  Krystal [name] 04/03/2023, 9:02 AM

## 2023-04-04 MED FILL — Fosaprepitant Dimeglumine For IV Infusion 150 MG (Base Eq): INTRAVENOUS | Qty: 5 | Status: AC

## 2023-04-04 MED FILL — Dexamethasone Sodium Phosphate Inj 100 MG/10ML: INTRAMUSCULAR | Qty: 1 | Status: AC

## 2023-04-05 ENCOUNTER — Inpatient Hospital Stay: Payer: Commercial Managed Care - PPO

## 2023-04-05 ENCOUNTER — Inpatient Hospital Stay: Payer: Commercial Managed Care - PPO | Admitting: Adult Health

## 2023-04-05 ENCOUNTER — Inpatient Hospital Stay: Payer: Commercial Managed Care - PPO | Admitting: Hematology and Oncology

## 2023-04-05 VITALS — BP 111/65 | HR 75 | Resp 17

## 2023-04-05 VITALS — BP 122/75 | HR 95 | Temp 97.7°F | Resp 17 | Ht 64.02 in | Wt 226.6 lb

## 2023-04-05 DIAGNOSIS — Z170 Estrogen receptor positive status [ER+]: Secondary | ICD-10-CM | POA: Diagnosis not present

## 2023-04-05 DIAGNOSIS — C50112 Malignant neoplasm of central portion of left female breast: Secondary | ICD-10-CM

## 2023-04-05 DIAGNOSIS — C50919 Malignant neoplasm of unspecified site of unspecified female breast: Secondary | ICD-10-CM

## 2023-04-05 DIAGNOSIS — Z95828 Presence of other vascular implants and grafts: Secondary | ICD-10-CM

## 2023-04-05 DIAGNOSIS — C7951 Secondary malignant neoplasm of bone: Secondary | ICD-10-CM

## 2023-04-05 LAB — CMP (CANCER CENTER ONLY)
ALT: 41 U/L (ref 0–44)
AST: 27 U/L (ref 15–41)
Albumin: 4.1 g/dL (ref 3.5–5.0)
Alkaline Phosphatase: 82 U/L (ref 38–126)
Anion gap: 6 (ref 5–15)
BUN: 8 mg/dL (ref 6–20)
CO2: 31 mmol/L (ref 22–32)
Calcium: 9 mg/dL (ref 8.9–10.3)
Chloride: 105 mmol/L (ref 98–111)
Creatinine: 0.71 mg/dL (ref 0.44–1.00)
GFR, Estimated: >60 mL/min (ref >60)
Glucose, Bld: 155 mg/dL — ABNORMAL HIGH (ref 70–99)
Potassium: 4.3 mmol/L (ref 3.5–5.1)
Sodium: 142 mmol/L (ref 135–145)
Total Bilirubin: 0.3 mg/dL (ref 0.3–1.2)
Total Protein: 7 g/dL (ref 6.5–8.1)

## 2023-04-05 LAB — CBC WITH DIFFERENTIAL (CANCER CENTER ONLY)
Abs Immature Granulocytes: 0 10*3/uL (ref 0.00–0.07)
Basophils Absolute: 0 10*3/uL (ref 0.0–0.1)
Basophils Relative: 1 %
Eosinophils Absolute: 0.2 10*3/uL (ref 0.0–0.5)
Eosinophils Relative: 8 %
HCT: 37.5 % (ref 36.0–46.0)
Hemoglobin: 12.4 g/dL (ref 12.0–15.0)
Immature Granulocytes: 0 %
Lymphocytes Relative: 36 %
Lymphs Abs: 0.9 10*3/uL (ref 0.7–4.0)
MCH: 31.9 pg (ref 26.0–34.0)
MCHC: 33.1 g/dL (ref 30.0–36.0)
MCV: 96.4 fL (ref 80.0–100.0)
Monocytes Absolute: 0.3 10*3/uL (ref 0.1–1.0)
Monocytes Relative: 10 %
Neutro Abs: 1.2 10*3/uL — ABNORMAL LOW (ref 1.7–7.7)
Neutrophils Relative %: 45 %
Platelet Count: 279 10*3/uL (ref 150–400)
RBC: 3.89 MIL/uL (ref 3.87–5.11)
RDW: 14.3 % (ref 11.5–15.5)
WBC Count: 2.6 10*3/uL — ABNORMAL LOW (ref 4.0–10.5)
nRBC: 0 % (ref 0.0–0.2)

## 2023-04-05 MED ORDER — HEPARIN SOD (PORK) LOCK FLUSH 100 UNIT/ML IV SOLN
500.0000 [IU] | Freq: Once | INTRAVENOUS | Status: AC | PRN
  Administered 2023-04-05: 500 [IU]

## 2023-04-05 MED ORDER — SODIUM CHLORIDE 0.9 % IV SOLN
10.0000 mg | Freq: Once | INTRAVENOUS | Status: AC
  Administered 2023-04-05: 10 mg via INTRAVENOUS
  Filled 2023-04-05: qty 1
  Filled 2023-04-05: qty 10

## 2023-04-05 MED ORDER — ACETAMINOPHEN 325 MG PO TABS
650.0000 mg | ORAL_TABLET | Freq: Once | ORAL | Status: AC
  Administered 2023-04-05: 650 mg via ORAL
  Filled 2023-04-05: qty 2

## 2023-04-05 MED ORDER — DIPHENHYDRAMINE HCL 25 MG PO CAPS
25.0000 mg | ORAL_CAPSULE | Freq: Once | ORAL | Status: AC
  Administered 2023-04-05: 25 mg via ORAL
  Filled 2023-04-05: qty 1

## 2023-04-05 MED ORDER — PALONOSETRON HCL INJECTION 0.25 MG/5ML
0.2500 mg | Freq: Once | INTRAVENOUS | Status: AC
  Administered 2023-04-05: 0.25 mg via INTRAVENOUS
  Filled 2023-04-05: qty 5

## 2023-04-05 MED ORDER — FAM-TRASTUZUMAB DERUXTECAN-NXKI CHEMO 100 MG IV SOLR
3.2000 mg/kg | Freq: Once | INTRAVENOUS | Status: AC
  Administered 2023-04-05: 300 mg via INTRAVENOUS
  Filled 2023-04-05: qty 15

## 2023-04-05 MED ORDER — DEXTROSE 5 % IV SOLN: Freq: Once | INTRAVENOUS | Status: AC

## 2023-04-05 MED ORDER — SODIUM CHLORIDE 0.9 % IV SOLN
150.0000 mg | Freq: Once | INTRAVENOUS | Status: AC
  Administered 2023-04-05: 150 mg via INTRAVENOUS
  Filled 2023-04-05: qty 150
  Filled 2023-04-05: qty 5

## 2023-04-05 MED ORDER — SODIUM CHLORIDE 0.9% FLUSH
10.0000 mL | INTRAVENOUS | Status: DC | PRN
  Administered 2023-04-05: 10 mL

## 2023-04-05 MED ORDER — ALTEPLASE 2 MG IJ SOLR
2.0000 mg | Freq: Once | INTRAMUSCULAR | Status: AC
  Administered 2023-04-05: 2 mg
  Filled 2023-04-05: qty 2

## 2023-04-05 MED ORDER — SODIUM CHLORIDE 0.9% FLUSH
10.0000 mL | Freq: Once | INTRAVENOUS | Status: AC
  Administered 2023-04-05: 10 mL

## 2023-04-05 NOTE — Progress Notes (Signed)
Per Dr Al Pimple, OK to treat today with echo change from 55-60% in April, to 50-55% in August. Lowella Bandy, RN aware.

## 2023-04-05 NOTE — Assessment & Plan Note (Signed)
08/22/2019: PET CT scan: Metastatic disease in the chest with multiple lung nodules all subcentimeter size and hypermetabolic lymph nodes (right paratracheal node 7 mm, subcarinal node 1 cm, right hilar node 6 mm), retroperitoneal and upper pelvic common iliac lymph nodes, bone metastases especially L5, L4, T6, right 10th rib, left proximal femur   Bone biopsy was not felt to be reliable in addition to the effect of radiation on the bone. Palliative radiation completed 09/03/2019 Guardant 360: BRCA1 mutation, TMB 5.36, MSI: Normal   Prior treatment: Ibrance with letrozole and Xgeva for bone metastases switched to abemaciclib and Fulvestrant   Bone metastasis: Patient does not want to take bisphosphonates CT CAP 08/23/2022: New and enlarged pulmonary nodule consistent with progression.  Enlarged small low cervical and mediastinal nodes.  Multiple bone metastases minimally progressive.  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  Skull metastases, abnormal enhancement left globe  ---------------------------------------------------------------- Current treatment:: Enhertu cycle 8 Enhertu toxicities: Fatigue ANC 1.6 today: Okay to treat   CT CAP: 12/29/2022: Interval decrease in size of supraclavicular, mediastinal, abdominal, lung nodules, liver metastases and bone metastases.   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  Other scattered foci of brain enhancement unchanged resolved left posterior globe metastases CT CAP 03/12/2023: Improvement in pulmonary metastases.  No  thoracic lymphadenopathy.  Similar hepatic lesions   She is hoping that the brain MRI also shows improvement. Return to clinic in 3 weeks for cycle 9

## 2023-04-05 NOTE — Patient Instructions (Signed)
[facility]  Discharge Instructions: Thank you for choosing [facility] to provide your oncology and hematology care.   If you have a lab appointment with the Cancer Center, please go directly to the Cancer Center and check in at the registration area.   Wear comfortable clothing and clothing appropriate for easy access to any Portacath or PICC line.   We strive to give you quality time with your provider. You may need to reschedule your appointment if you arrive late (15 or more minutes).  Arriving late affects you and other patients whose appointments are after yours.  Also, if you miss three or more appointments without notifying the office, you may be dismissed from the clinic at the provider's discretion.      For prescription refill requests, have your pharmacy contact our office and allow 72 hours for refills to be completed.    Today you received the following chemotherapy and/or immunotherapy agents Enhurtu      To help prevent nausea and vomiting after your treatment, we encourage you to take your nausea medication as directed.  BELOW ARE SYMPTOMS THAT SHOULD BE REPORTED IMMEDIATELY: *FEVER GREATER THAN 100.4 F (38 C) OR HIGHER *CHILLS OR SWEATING *NAUSEA AND VOMITING THAT IS NOT CONTROLLED WITH YOUR NAUSEA MEDICATION *UNUSUAL SHORTNESS OF BREATH *UNUSUAL BRUISING OR BLEEDING *URINARY PROBLEMS (pain or burning when urinating, or frequent urination) *BOWEL PROBLEMS (unusual diarrhea, constipation, pain near the anus) TENDERNESS IN MOUTH AND THROAT WITH OR WITHOUT PRESENCE OF ULCERS (sore throat, sores in mouth, or a toothache) UNUSUAL RASH, SWELLING OR PAIN  UNUSUAL VAGINAL DISCHARGE OR ITCHING   Items with * indicate a potential emergency and should be followed up as soon as possible or go to the Emergency Department if any problems should occur.  Please show the CHEMOTHERAPY ALERT CARD or IMMUNOTHERAPY ALERT CARD at check-in  to the Emergency Department and triage nurse.  Should you have questions after your visit or need to cancel or reschedule your appointment, please contact [facility]  Dept: [phone]  and follow the prompts.  Office hours are 8:00 a.m. to 4:30 p.m. Monday - Friday. Please note that voicemails left after 4:00 p.m. may not be returned until the following business day.  We are closed weekends and major holidays. You have access to a nurse at all times for urgent questions. Please call the main number to the clinic Dept: [phone] and follow the prompts.   For any non-urgent questions, you may also contact your provider using MyChart. We now offer e-Visits for anyone 27 and older to request care online for non-urgent symptoms. For details visit [email or web address].   Also download the MyChart app! Go to the app store, search "MyChart", open the app, select [facility], and log in with your MyChart username and password.

## 2023-04-05 NOTE — Progress Notes (Signed)
Patient Care Team: Patient, [name] Serena Croissant, MD as Medical Oncologist (Hematology and Oncology) [name], MD as Consulting Physician (Radiation Oncology) [name], NP as Nurse Practitioner (Hematology and Oncology) [name], MD as Consulting Physician (General Surgery) [name], RPH-CPP as Pharmacist (Hematology and Oncology)  DIAGNOSIS:  Encounter Diagnoses  Name Primary?   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  BRCA1 p.[id number] VUS identified as well.  The report date is 04/02/2014.  UPDATE: BRCA1 V.[id number] VUS has been reclassified as a likely benign variant.  The reclassification date is 04/21/2020.   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 N0 M0 stage IA: Oncotype DX 13 (ROR 9%)   04/16/2014 Surgery   Left upper back melanoma resected no residual melanoma identified on re-resection margins negative   05/26/2014 - 06/08/2015 Anti-estrogen oral therapy   Tamoxifen 20 mg daily with a plan to switch her to aromatase inhibitors once she gets oophorectomy ( patient did not continue antiestrogen therapy by choice)   08/13/2014 Surgery   oophorectomy with total abdominal hysterectomy   09/08/2015 -  Anti-estrogen oral therapy   Resumed anastrozole after she found recurrence of breast cancer; stopped in January 2018, started letrozole 2.5 mg daily 10/12/2016; switched to  Exemestane 25 mg daily on 01/03/17   09/16/2015 Surgery   Skin, left: IDC grade 3; 0.5 cm, margins negative, ER 95%, PR 10%, HER-2 positive ratio 2.08   11/08/2015 - 02/2017 Chemotherapy   Herceptin every 3 weeks plus anastrozole   11/24/2015 - 01/14/2016 Radiation Therapy   Adjuvant radiation therapy by Dr. Roselind Messier   01/2017 -  Anti-estrogen oral therapy   Exemestane daily (stopped others due to side effects), due to muscle aches and pains switch to tamoxifen 5 mg daily starting 12/18/2017   08/07/2019 Relapse/Recurrence   Bone scan: Sclerotic metastatic lesions involving left femoral neck, right ninth rib and entire L5 vertebra; MRI left hip: Diffuse signal abnormality L5 vertebral body with possible extraosseous extension   08/22/2019 - 09/03/2019 Radiation Therapy   Palliative radiation with Dr. Roselind Messier to L5 and left proixmal femur.    10/2021 Treatment Plan Change   Fulvestrant and Verzenio   08/23/2022 Imaging   CT CAP 08/23/2022: New and enlarged pulmonary nodule consistent with progression.  Enlarged small low cervical and mediastinal nodes.  Multiple bone metastases minimally progressive.  Liver lesions evaluation is challenging because of hepatic steatosis with suspected liver progression, isolated tiny omental nodule     09/07/2022 PET scan   IMPRESSION: 1. Worsening of disease now with nodal, pulmonary and hepatic metastases. 2. Areas of variable FDG uptake with some new bone lesions and some areas of bony metastasis which are more focal, for instance in the pelvis where there was likely diffuse involvement on prior imaging. Many of the areas of sclerosis do not show substantial uptake as outlined above and some areas show improvement/resolution of FDG uptake seen previously but there are signs of active  bony metastatic disease currently. 3. Likely LEFT adrenal adenoma. 4. Severe hepatic steatosis. 5. Aortic atherosclerosis.   Primary malignant neoplasm of breast with metastasis  (HCC)  08/25/2019 Initial Diagnosis   Primary malignant neoplasm of breast with metastasis (HCC)   10/11/2022 -  Chemotherapy   Patient is on Treatment Plan : BREAST METASTATIC Fam-Trastuzumab Deruxtecan-nxki (Enhertu) (5.4) q21d       CHIEF COMPLIANT: Enhertu cycle 8   INTERVAL HISTORY: Krystal [name] is a  57 year old above-mentioned cement site breast cancer. Currently on Enhertu. She presents to the clinic for a follow-up. Patient reports no new symptoms to the clinic today.     ALLERGIES:  is allergic to dilaudid [hydromorphone] and codeine.  MEDICATIONS:  Current Outpatient Medications  Medication Sig Dispense Refill   aspirin EC 81 MG tablet Take 1 tablet (81 mg total) by mouth daily. Swallow whole. 90 tablet 3   aspirin-acetaminophen-caffeine (EXCEDRIN MIGRAINE) 250-250-65 MG tablet Take 2 tablets by mouth every 6 (six) hours as needed for headache.     CALCIUM PO Take 1,200 mg by mouth 2 (two) times daily.     dexamethasone (DECADRON) 4 MG tablet Take 1 tablets (4 mg) by mouth daily for 3 days after chemotherapy. Take with food. 30 tablet 1   esomeprazole (NEXIUM) 40 MG capsule Take 40 mg by mouth daily.     lidocaine-prilocaine (EMLA) cream Apply to affected area once 30 g 3   loperamide (IMODIUM) 2 MG capsule Take 2 mg by mouth daily as needed for diarrhea or loose stools. Take 2 tablets at first diarrhea and then 1 tablet after additional loose stool. Max number in 24 hours is 8 tablets.     loratadine (CLARITIN) 10 MG tablet Take 10 mg by mouth daily.     meloxicam (MOBIC) 15 MG tablet TAKE 1 TABLET(15 MG) BY MOUTH DAILY AS NEEDED FOR PAIN 30 tablet 1   ondansetron (ZOFRAN) 8 MG tablet Take 1 tablet (8 mg total) by mouth every 8 (eight) hours as needed for nausea or vomiting. Start on the third day after chemotherapy. 30 tablet 1   prochlorperazine (COMPAZINE) 10 MG tablet Take 1 tablet (10 mg total) by mouth every 6 (six) hours as needed for nausea or vomiting. 30 tablet 1    promethazine (PHENERGAN) 25 MG suppository Place 1 suppository (25 mg total) rectally every 6 (six) hours as needed for nausea or vomiting. 12 each 0   rosuvastatin (CRESTOR) 10 MG tablet Take 1 tablet (10 mg total) by mouth daily. 90 tablet 3   traMADol (ULTRAM) 50 MG tablet Take 1 tablet (50 mg total) by mouth every 6 (six) hours as needed. 10 tablet 0   traZODone (DESYREL) 50 MG tablet TAKE 1 TABLET(50 MG) BY MOUTH AT BEDTIME 90 tablet 0   venlafaxine XR (EFFEXOR-XR) 75 MG 24 hr capsule TAKE 1 CAPSULE(75 MG) BY MOUTH AT BEDTIME 90 capsule 3   dicyclomine (BENTYL) 20 MG tablet Take 1 tablet (20 mg total) by mouth every 6 (six) hours as needed for up to 7 days for spasms. 28 tablet 0   No current facility-administered medications for this visit.    PHYSICAL EXAMINATION: ECOG PERFORMANCE STATUS: 1 - Symptomatic but completely ambulatory  There were no vitals filed for this visit. There were no vitals filed for this visit.    LABORATORY DATA:  I have reviewed the data as listed    Latest Ref Rng & Units 03/15/2023    2:02 PM 02/21/2023  2:25 PM 01/31/2023   11:08 AM  CMP  Glucose 70 - 99 mg/dL 782  91  956   BUN 6 - 20 mg/dL 9  9  8    Creatinine 0.44 - 1.00 mg/dL 2.13  0.86  5.78   Sodium 135 - 145 mmol/L 140  140  141   Potassium 3.5 - 5.1 mmol/L 4.1  4.0  4.3   Chloride 98 - 111 mmol/L 104  103  106   CO2 22 - 32 mmol/L 31  28  29    Calcium 8.9 - 10.3 mg/dL 8.8  9.4  9.5   Total Protein 6.5 - 8.1 g/dL 7.0  7.1  7.1   Total Bilirubin 0.3 - 1.2 mg/dL 0.4  0.5  0.4   Alkaline Phos 38 - 126 U/L 84  90  83   AST 15 - 41 U/L 34  27  32   ALT 0 - 44 U/L 44  37  40     Lab Results  Component Value Date   WBC 3.3 (L) 03/15/2023   HGB 12.4 03/15/2023   HCT 37.0 03/15/2023   MCV 95.1 03/15/2023   PLT 302 03/15/2023   NEUTROABS 1.6 (L) 03/15/2023    ASSESSMENT & PLAN:  Cancer of central portion of left female breast (HCC) 08/22/2019: PET CT scan: Metastatic disease in the chest  with multiple lung nodules all subcentimeter size and hypermetabolic lymph nodes (right paratracheal node 7 mm, subcarinal node 1 cm, right hilar node 6 mm), retroperitoneal and upper pelvic common iliac lymph nodes, bone metastases especially L5, L4, T6, right 10th rib, left proximal femur   Bone biopsy was not felt to be reliable in addition to the effect of radiation on the bone. Palliative radiation completed 09/03/2019 Guardant 360: BRCA1 mutation, TMB 5.36, MSI: Normal   Prior treatment: Ibrance with letrozole and Xgeva for bone metastases switched to abemaciclib and Fulvestrant   Bone metastasis: Patient does not want to take bisphosphonates CT CAP 08/23/2022: New and enlarged pulmonary nodule consistent with progression.  Enlarged small low cervical and mediastinal nodes.  Multiple bone metastases minimally progressive.  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  Skull metastases, abnormal enhancement left globe  ---------------------------------------------------------------- Current treatment:: Enhertu cycle 8 Enhertu toxicities: Fatigue ANC 1.6 today: Okay to treat   CT CAP: 12/29/2022: Interval decrease in size of supraclavicular, mediastinal, abdominal, lung nodules, liver metastases and bone metastases.   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  Other scattered foci of brain enhancement unchanged resolved left posterior globe metastases CT CAP 03/12/2023: Improvement in pulmonary metastases.  No thoracic lymphadenopathy.  Similar hepatic lesions   She is  hoping that the brain MRI also shows improvement. Return to clinic in 3 weeks for cycle 9    No orders of the defined types were placed in this encounter.  The patient has a good understanding of the overall plan. she agrees with it. she will call with any problems that may develop before the next visit here. Total time spent: 30 mins including face to face time and time spent for planning, charting and co-ordination of care   Viinay K  Pamelia Hoit, MD 04/05/23    I Janan Ridge am acting as a Neurosurgeon for [employer]  I have reviewed the above documentation for accuracy and completeness, and I agree with the above.  -

## 2023-04-12 ENCOUNTER — Telehealth: Payer: Self-pay | Admitting: Radiation Oncology

## 2023-04-12 NOTE — Telephone Encounter (Signed)
PT REQUESTED HER 04-17-23 FU W/ DR. Basilio Cairo BE SWITCHED TO A TELEPHONE VISIT - CALLED PT TO INFORM HER THAT I SWITCHED HER FU TO TELEPHONE - NO ANSWER - LVM

## 2023-04-13 ENCOUNTER — Ambulatory Visit
Admission: RE | Admit: 2023-04-13 | Discharge: 2023-04-13 | Disposition: A | Discharge status: Home / Self Care | Payer: Commercial Managed Care - PPO | Source: Ambulatory Visit | Attending: Radiation Oncology | Admitting: Radiation Oncology

## 2023-04-13 DIAGNOSIS — C7931 Secondary malignant neoplasm of brain: Secondary | ICD-10-CM

## 2023-04-13 MED ORDER — GADOPICLENOL 0.5 MMOL/ML IV SOLN
10.0000 mL | Freq: Once | INTRAVENOUS | Status: AC | PRN
  Administered 2023-04-13: 10 mL via INTRAVENOUS

## 2023-04-17 ENCOUNTER — Telehealth: Payer: Self-pay

## 2023-04-17 ENCOUNTER — Encounter: Payer: Self-pay | Admitting: Radiation Oncology

## 2023-04-17 ENCOUNTER — Ambulatory Visit
Admission: RE | Admit: 2023-04-17 | Discharge: 2023-04-17 | Disposition: A | Discharge status: Home / Self Care | Payer: Commercial Managed Care - PPO | Source: Ambulatory Visit | Attending: Radiation Oncology | Admitting: Radiation Oncology

## 2023-04-17 DIAGNOSIS — C7931 Secondary malignant neoplasm of brain: Secondary | ICD-10-CM

## 2023-04-17 NOTE — Progress Notes (Signed)
Radiation Oncology         [phone]) [phone] ________________________________  Follow-up outpatient by telephone.  The patient opted for telemedicine to maximize safety during the pandemic.  MyChart video was not obtainable.   Name: Krystal [name] MRN: [id number]  Date: 04/17/2023  DOB: [birth year]  BM:[id number], [name]  Barbaraann Cao Georgeanna Lea, MD   REFERRING PHYSICIAN: Henreitta Leber, MD  DIAGNOSIS:    ICD-10-CM   1. 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 Multifocal osseous metastatic disease diagnosed in 2021.   Cancer Staging  Cancer of central portion of left female breast [facility]) Staging form: Breast, AJCC 7th Edition - Clinical: No stage assigned - Unsigned Laterality: Right - Pathologic: Stage IA (T1c, N0, cM0) - Signed by Sabas Sous, MD on 05/12/2014 Laterality: Right   CHIEF COMPLAINT: [name] presents today to discuss MRI results. She has not received radiation treatment to her brain.   Narrative: [name] presents today to discuss MRI results with Dr. Basilio Cairo. She has not received radiation treatment to her brain. Pt is to have an MRI on 04-13-23.    Recent neurologic symptoms, if any:  Seizures: no Headaches: yes, headaches with weather changes, easy to manage if caught early Nausea: yes, if someone wears strong perfume, etc., will gag or actually throw up, occurs with food as well at times Dizziness/ataxia: yes, intermittent, from lack of sleep Difficulty with hand coordination: no Focal numbness/weakness: no Visual deficits/changes: yes, has improved, left eye symptoms improved  Confusion/Memory deficits: yes, some memory issues with little things at times. Reports trouble getting words out.    Other issues of note: No concerns at this time. Pt  has already read the the test results. She saw nothing new on scan.    MR Brain W Wo Contrast 04/13/2023  (personally reviewed by me) IMPRESSION: 1. Unchanged leptomeningeal enhancement along the right central sulcus and pachymeningeal enhancement overlying the right superior frontal gyrus. 2. Unchanged sclerotic calvarial metastases. 3. No new intracranial metastases.    PREVIOUS RADIATION THERAPY: Yes    2) Diagnosis:   Metastatic breast cancer with osseous metastases Treatment Intent: Palliative Radiation Treatment Dates: 08/21/2019 through 09/03/2019 Site Technique Total Dose (Gy) Dose per Fx (Gy) Completed Fx Beam Energies  Femur Left: Ext_Lt 3D 30/30 3 10/10 10X, 15X  Lumbar Spine: L5 3D 30/30 3 10/10 10X, 15X    1) Radiation Treatment Dates: 11/25/15 - 01/14/16:  1) Left chest wall treated to 45 Gy in 25 fractions  2) Scar boost treated to 18 Gy in 9 fractions   PAST MEDICAL HISTORY:  has a past medical history of Arthritis, BRCA1 positive, Cancer (HCC), Chest wall mass (09/2015), Complication of anesthesia, Cough with sputum (09/10/2015), Dental crowns present, Diabetes mellitus without complication (HCC), Family history of Lynch syndrome, Family history of pancreatic cancer, GERD (gastroesophageal reflux disease), Headache, History of breast cancer (2015), and Stuffy and runny nose (09/10/2015).    PAST SURGICAL HISTORY: Past Surgical History:  Procedure Laterality Date   ABDOMINAL HYSTERECTOMY     BREAST LUMPECTOMY Left 09/16/2015   Procedure: LEFT BREAST MASS EXCISION;  Surgeon: [name], MD;  Location: [facility];  Service: General;  Laterality: Left;   BREAST RECONSTRUCTION WITH PLACEMENT OF TISSUE EXPANDER AND  FLEX HD (ACELLULAR HYDRATED DERMIS) Bilateral 04/16/2014   Procedure: BILATERAL BREAST RECONSTRUCTION WITH PLACEMENT OF TISSUE EXPANDER AND FLEX HD (ACELLULAR HYDRATED DERMIS);  Surgeon: Glenna Fellows, MD;  Location: [facility];   Service: Plastics;  Laterality: Bilateral;   CESAREAN SECTION  1999; 2001; 2005   CHOLECYSTECTOMY  1995   COLONOSCOPY     DEBRIDEMENT AND CLOSURE WOUND Bilateral 05/15/2014   Procedure: DEBRIDEMENT AND CLOSURE OF BILATERAL MASECTOMY INCISIONS WITH BREAST EXPANSION;  Surgeon: Glenna Fellows, MD;  Location: [facility];  Service: Plastics;  Laterality: Bilateral;   LAPAROSCOPIC ASSISTED VAGINAL HYSTERECTOMY N/A 08/13/2014   Procedure: OPEN LAPAROSCOPY, EXPLORATORY LAPAROTOMY, TOTAL ABDOMINAL HYSTERECTOMY WITH BILATERAL SALPINGECTOMY AND BILATERAL OOPHORECTOMY;  Surgeon: Juluis Mire, MD;  Location: [facility];  Service: Gynecology;  Laterality: N/A;   LIPOSUCTION WITH LIPOFILLING Bilateral 11/13/2014   Procedure: LIPOFILLING TO BILATERAL CHEST ;  Surgeon: Glenna Fellows, MD;  Location: [facility];  Service: Plastics;  Laterality: Bilateral;   MELANOMA EXCISION N/A 04/16/2014   Procedure: WIDE LOCAL EXCISION OF BACK MELANOMA;  Surgeon: [name], MD;  Location: [facility];  Service: General;  Laterality: N/A;   PORT-A-CATH REMOVAL Right 06/14/2017   Procedure: REMOVAL PORT-A-CATH;  Surgeon: [name], MD;  Location: [facility];  Service: General;  Laterality: Right;   PORTACATH PLACEMENT Right 10/21/2015   Procedure: INSERTION PORT-A-CATH WITH ULTRASOUND ;  Surgeon: [name], MD;  Location: [facility];  Service: General;  Laterality: Right;   PORTACATH PLACEMENT N/A 10/10/2022   Procedure: INSERTION PORT-A-CATH;  Surgeon: [name], MD;  Location: [facility];  Service: General;  Laterality: N/A;   REMOVAL OF BILATERAL TISSUE EXPANDERS WITH PLACEMENT OF BILATERAL BREAST IMPLANTS Bilateral 11/13/2014   Procedure: REMOVAL OF BILATERAL TISSUE EXPANDERS WITH PLACEMENT OF BILATERAL SILICONE IMPLANTS ;  Surgeon: Glenna Fellows, MD;  Location: [facility];  Service: Plastics;   Laterality: Bilateral;   SIMPLE MASTECTOMY WITH AXILLARY SENTINEL NODE BIOPSY Bilateral 04/16/2014   Procedure: BILATERAL SKIN SPARING  MASTECTOMIES WITH LEFT AXILLARY SENTINEL NODE BIOPSY;  Surgeon: [name], MD;  Location: [facility];  Service: General;  Laterality: Bilateral;    FAMILY HISTORY: family history includes BRCA 1/2 in her cousin; Breast cancer in her paternal aunt and paternal grandmother; Heart attack in her maternal grandfather; Heart disease in her maternal grandmother; Other in her cousin and father; Pancreatic cancer (age of onset: 50) in her paternal uncle.  SOCIAL HISTORY:  reports that she quit smoking about 9 years ago. Her smoking use included cigarettes. She has never used smokeless tobacco. She reports current alcohol use. She reports that she does not use drugs.  ALLERGIES: Dilaudid [hydromorphone] and Codeine  MEDICATIONS:  Current Outpatient Medications  Medication Sig Dispense Refill   aspirin EC 81 MG tablet Take 1 tablet (81 mg total) by mouth daily. Swallow whole. 90 tablet 3   aspirin-acetaminophen-caffeine (EXCEDRIN MIGRAINE) 250-250-65 MG tablet Take 2 tablets by mouth every 6 (six) hours as needed for headache.     CALCIUM PO Take 1,200 mg by mouth 2 (two) times daily.     dexamethasone (DECADRON) 4 MG tablet Take 1 tablets (4 mg) by mouth daily for 3 days after chemotherapy. Take with food. 30 tablet 1   esomeprazole (NEXIUM) 40 MG capsule Take 40 mg by mouth daily.     lidocaine-prilocaine (EMLA) cream Apply to affected area once 30 g 3   loperamide (IMODIUM)  2 MG capsule Take 2 mg by mouth daily as needed for diarrhea or loose stools. Take 2 tablets at first diarrhea and then 1 tablet after additional loose stool. Max number in 24 hours is 8 tablets.     loratadine (CLARITIN) 10 MG tablet Take 10 mg by mouth daily.     meloxicam (MOBIC) 15 MG tablet TAKE 1 TABLET(15 MG) BY MOUTH DAILY AS NEEDED FOR PAIN 30 tablet 1   ondansetron  (ZOFRAN) 8 MG tablet Take 1 tablet (8 mg total) by mouth every 8 (eight) hours as needed for nausea or vomiting. Start on the third day after chemotherapy. 30 tablet 1   prochlorperazine (COMPAZINE) 10 MG tablet Take 1 tablet (10 mg total) by mouth every 6 (six) hours as needed for nausea or vomiting. 30 tablet 1   promethazine (PHENERGAN) 25 MG suppository Place 1 suppository (25 mg total) rectally every 6 (six) hours as needed for nausea or vomiting. 12 each 0   rosuvastatin (CRESTOR) 10 MG tablet Take 1 tablet (10 mg total) by mouth daily. 90 tablet 3   traMADol (ULTRAM) 50 MG tablet Take 1 tablet (50 mg total) by mouth every 6 (six) hours as needed. 10 tablet 0   traZODone (DESYREL) 50 MG tablet TAKE 1 TABLET(50 MG) BY MOUTH AT BEDTIME 90 tablet 0   venlafaxine XR (EFFEXOR-XR) 75 MG 24 hr capsule TAKE 1 CAPSULE(75 MG) BY MOUTH AT BEDTIME 90 capsule 3   dicyclomine (BENTYL) 20 MG tablet Take 1 tablet (20 mg total) by mouth every 6 (six) hours as needed for up to 7 days for spasms. 28 tablet 0   No current facility-administered medications for this encounter.    REVIEW OF SYSTEMS:  Notable for that above.   PHYSICAL EXAM:  vitals were not taken for this visit.    General: Alert and oriented, in no acute distress     LABORATORY DATA:  Lab Results  Component Value Date   WBC 2.6 (L) 04/05/2023   HGB 12.4 04/05/2023   HCT 37.5 04/05/2023   MCV 96.4 04/05/2023   PLT 279 04/05/2023   CMP     Component Value Date/Time   NA 142 04/05/2023 1449   NA 142 02/16/2017 1110   K 4.3 04/05/2023 1449   K 4.3 02/16/2017 1110   CL 105 04/05/2023 1449   CO2 31 04/05/2023 1449   CO2 27 02/16/2017 1110   GLUCOSE 155 (H) 04/05/2023 1449   GLUCOSE 190 (H) 02/16/2017 1110   BUN 8 04/05/2023 1449   BUN 7.9 02/16/2017 1110   CREATININE 0.71 04/05/2023 1449   CREATININE 0.8 02/16/2017 1110   CALCIUM 9.0 04/05/2023 1449   CALCIUM 9.4 02/16/2017 1110   PROT 7.0 04/05/2023 1449   PROT 7.4  02/16/2017 1110   ALBUMIN 4.1 04/05/2023 1449   ALBUMIN 3.7 02/16/2017 1110   AST 27 04/05/2023 1449   AST 27 02/16/2017 1110   ALT 41 04/05/2023 1449   ALT 50 02/16/2017 1110   ALKPHOS 82 04/05/2023 1449   ALKPHOS 88 02/16/2017 1110   BILITOT 0.3 04/05/2023 1449   BILITOT 0.41 02/16/2017 1110   GFRNONAA >60 04/05/2023 1449   GFRAA >60 03/15/2020 0926         RADIOGRAPHY: MR Brain W Wo Contrast  Result Date: 04/16/2023 CLINICAL DATA:  Brain metastases, assess treatment response pt has not received any radiation to treat brain or orbital lesions. Following Microhemorrhages and assessing systemic treatment response. EXAM: MRI HEAD WITHOUT AND WITH  CONTRAST TECHNIQUE: Multiplanar, multiecho pulse sequences of the brain and surrounding structures were obtained without and with intravenous contrast. CONTRAST:  10 mL Vueway. COMPARISON:  MRI brain 12/28/2022 and older. FINDINGS: BRAIN New Lesions: None. Larger lesions: None. Stable or Smaller lesions: Unchanged leptomeningeal enhancement along the right central sulcus (axial image 126 series 13. Unchanged pachymeningeal enhancement overlying the right superior frontal gyrus (axial image 141 series 13). Unchanged scattered, tiny foci of enhancement in the sulci of the bilateral cerebral and right cerebellar hemisphere, again favored to reflect vascular artifact. Other Brain findings: No acute infarct or hemorrhage. Unchanged scattered foci of T2 hyperintensity in the cerebral white matter, most consistent with chronic small-vessel disease. Unchanged numerous foci of cortical and subcortical susceptibility, consistent with chronic hemosiderin deposition. Vascular: Normal flow voids and vessel enhancement. Skull and upper cervical spine: Unchanged sclerotic calvarial metastases. No new lesions. Sinuses/Orbits: Unremarkable. Other: None. IMPRESSION: 1. Unchanged leptomeningeal enhancement along the right central sulcus and pachymeningeal enhancement  overlying the right superior frontal gyrus. 2. Unchanged sclerotic calvarial metastases. 3. No new intracranial metastases. Electronically Signed   By: Orvan Falconer M.D.   On: 04/16/2023 15:46   ECHOCARDIOGRAM COMPLETE  Result Date: 04/03/2023    ECHOCARDIOGRAM REPORT   Patient Name:   JATANA SHUNK Date of Exam: 04/03/2023 Medical Rec #:  [id number]    Height:       64.0 in Accession #:    [id number]   Weight:       223.8 lb Date of Birth:  07-Jun-1966     BSA:          2.052 m Patient Age:    57 years     BP:           125/74 mmHg Patient Gender: F            HR:           71 bpm. Exam Location:  Outpatient Procedure: 2D Echo, Cardiac Doppler, Color Doppler and Strain Analysis Indications:    Chemo Z09  History:        Patient has prior history of Echocardiogram examinations, most                 recent 11/21/2022. Risk Factors:Diabetes.  Sonographer:    Eulah Pont RDCS Referring Phys: [id number] Serena Croissant  Sonographer Comments: Global longitudinal strain was attempted. IMPRESSIONS  1. Left ventricular systolic function and GLS are slightly worse compared with echo 11/2022. Left ventricular ejection fraction, by estimation, is 50 to 55%. The left ventricle has low normal function. The left ventricle has no regional wall motion abnormalities. Left ventricular diastolic parameters are consistent with Grade I diastolic dysfunction (impaired relaxation). The average left ventricular global longitudinal strain is -16.5 %. The global longitudinal strain is abnormal.  2. Right ventricular systolic function is normal. The right ventricular size is normal.  3. The mitral valve is normal in structure. No evidence of mitral valve regurgitation. No evidence of mitral stenosis.  4. The aortic valve is tricuspid. Aortic valve regurgitation is mild. No aortic stenosis is present.  5. The inferior vena cava is normal in size with greater than 50% respiratory variability, suggesting right atrial pressure of 3 mmHg. FINDINGS   Left Ventricle: Left ventricular systolic function and GLS are slightly worse compared with echo 11/2022. Left ventricular ejection fraction, by estimation, is 50 to 55%. The left ventricle has low normal function. The left ventricle has no regional wall  motion abnormalities. The average left  ventricular global longitudinal strain is -16.5 %. The global longitudinal strain is abnormal. The left ventricular internal cavity size was normal in size. There is no left ventricular hypertrophy. Left ventricular diastolic parameters are consistent with Grade I diastolic dysfunction (impaired relaxation). Normal left ventricular filling pressure. Right Ventricle: The right ventricular size is normal. No increase in right ventricular wall thickness. Right ventricular systolic function is normal. Left Atrium: Left atrial size was normal in size. Right Atrium: Right atrial size was normal in size. Pericardium: There is no evidence of pericardial effusion. Mitral Valve: The mitral valve is normal in structure. No evidence of mitral valve regurgitation. No evidence of mitral valve stenosis. Tricuspid Valve: The tricuspid valve is normal in structure. Tricuspid valve regurgitation is trivial. No evidence of tricuspid stenosis. Aortic Valve: The aortic valve is tricuspid. Aortic valve regurgitation is mild. Aortic regurgitation PHT measures 421 msec. No aortic stenosis is present. Pulmonic Valve: The pulmonic valve was normal in structure. Pulmonic valve regurgitation is trivial. No evidence of pulmonic stenosis. Aorta: The aortic root is normal in size and structure. Venous: The inferior vena cava is normal in size with greater than 50% respiratory variability, suggesting right atrial pressure of 3 mmHg. IAS/Shunts: No atrial level shunt detected by color flow Doppler.  LEFT VENTRICLE PLAX 2D LVIDd:         5.00 cm     Diastology LVIDs:         3.20 cm     LV e' medial:    6.94 cm/s LV PW:         1.00 cm     LV E/e' medial:  5.5  LV IVS:        1.00 cm     LV e' lateral:   7.28 cm/s LVOT diam:     2.00 cm     LV E/e' lateral: 5.2 LV SV:         47 LV SV Index:   23          2D Longitudinal Strain LVOT Area:     3.14 cm    2D Strain GLS (A2C):   -15.8 %                            2D Strain GLS (A3C):   -15.1 %                            2D Strain GLS (A4C):   -18.6 % LV Volumes (MOD)           2D Strain GLS Avg:     -16.5 % LV vol d, MOD A2C: 91.2 ml LV vol d, MOD A4C: 82.1 ml LV vol s, MOD A2C: 41.9 ml LV vol s, MOD A4C: 36.5 ml LV SV MOD A2C:     49.3 ml LV SV MOD A4C:     82.1 ml LV SV MOD BP:      48.8 ml RIGHT VENTRICLE RV S prime:     7.62 cm/s TAPSE (M-mode): 1.3 cm LEFT ATRIUM             Index        RIGHT ATRIUM           Index LA diam:        3.70 cm 1.80 cm/m   RA Area:     13.10 cm LA Vol (A2C):  32.7 ml 15.93 ml/m  RA Volume:   29.90 ml  14.57 ml/m LA Vol (A4C):   34.1 ml 16.62 ml/m LA Biplane Vol: 34.2 ml 16.67 ml/m  AORTIC VALVE LVOT Vmax:         76.80 cm/s LVOT Vmean:        51.100 cm/s LVOT VTI:          0.150 m AI PHT:            421 msec AR Vena Contracta: 0.20 cm  AORTA Ao Root diam: 3.60 cm Ao Asc diam:  3.30 cm MITRAL VALVE MV Area (PHT): 2.48 cm    SHUNTS MV Decel Time: 306 msec    Systemic VTI:  0.15 m MV E velocity: 37.90 cm/s  Systemic Diam: 2.00 cm MV A velocity: 57.30 cm/s MV E/A ratio:  0.66 Chilton Si MD Electronically signed by Chilton Si MD Signature Date/Time: 04/03/2023/11:46:38 AM    Final       IMPRESSION/PLAN:History of invasive ductal carcinoma of the left breast with intracranial disease; she has not received radiation therapy to the brain   Recent imaging in the form of brain MRI looks good; I personally reviewed her imaging. The putative metastasis in the left posterior eye has remained resolved.   The other foci of leptomeningeal disease are stable.  She feels well. She will continue Enhertu treatment under the care of Dr. Pamelia Hoit.  We will arrange another MRI to be done in 6  months and she will follow-up with Dr. Barbaraann Cao in the future.  She will contact us if her symptoms worsen and warrant an MRI sooner.  I will see her back as needed.  She is very happy with this plan.  This encounter was provided by telemedicine platform; patient desired telemedicine during pandemic precautions.  MyChart video was not available and therefore telephone was used. The patient has given verbal consent for this type of encounter and has been advised to only accept a meeting of this type in a secure network environment. On date of service, in total, I spent 20 minutes on this encounter.   The attendants for this meeting include Lonie Peak  and Olga Coaster During the encounter, Lonie Peak was located at [facility] Radiation Oncology Department.  Leneisha Kulaga was located at home.    __________________________________________     Lonie Peak, MD

## 2023-04-17 NOTE — Telephone Encounter (Signed)
RN called pt for telephone follow up. Note completed and routed to Dr. Basilio Cairo and Quitman Livings  PA-C for review.

## 2023-04-25 MED FILL — Dexamethasone Sodium Phosphate Inj 100 MG/10ML: INTRAMUSCULAR | Qty: 1 | Status: AC

## 2023-04-25 MED FILL — Fosaprepitant Dimeglumine For IV Infusion 150 MG (Base Eq): INTRAVENOUS | Qty: 5 | Status: AC

## 2023-04-26 ENCOUNTER — Inpatient Hospital Stay: Payer: Commercial Managed Care - PPO

## 2023-04-26 ENCOUNTER — Ambulatory Visit: Payer: Commercial Managed Care - PPO

## 2023-04-26 ENCOUNTER — Ambulatory Visit: Payer: Commercial Managed Care - PPO | Admitting: Adult Health

## 2023-04-26 ENCOUNTER — Other Ambulatory Visit: Payer: Commercial Managed Care - PPO

## 2023-04-26 ENCOUNTER — Inpatient Hospital Stay: Payer: Commercial Managed Care - PPO | Admitting: Hematology and Oncology

## 2023-04-26 ENCOUNTER — Inpatient Hospital Stay: Payer: Commercial Managed Care - PPO | Attending: Hematology and Oncology

## 2023-04-26 VITALS — BP 133/79 | HR 87 | Temp 97.8°F | Resp 18 | Ht 64.02 in | Wt 225.8 lb

## 2023-04-26 VITALS — BP 139/66 | HR 88 | Resp 17

## 2023-04-26 DIAGNOSIS — C50919 Malignant neoplasm of unspecified site of unspecified female breast: Secondary | ICD-10-CM | POA: Diagnosis not present

## 2023-04-26 DIAGNOSIS — Z79899 Other long term (current) drug therapy: Secondary | ICD-10-CM | POA: Diagnosis not present

## 2023-04-26 DIAGNOSIS — Z7952 Long term (current) use of systemic steroids: Secondary | ICD-10-CM | POA: Insufficient documentation

## 2023-04-26 DIAGNOSIS — R5383 Other fatigue: Secondary | ICD-10-CM | POA: Diagnosis not present

## 2023-04-26 DIAGNOSIS — Z85820 Personal history of malignant melanoma of skin: Secondary | ICD-10-CM | POA: Diagnosis not present

## 2023-04-26 DIAGNOSIS — Z5112 Encounter for antineoplastic immunotherapy: Secondary | ICD-10-CM | POA: Diagnosis not present

## 2023-04-26 DIAGNOSIS — C50112 Malignant neoplasm of central portion of left female breast: Secondary | ICD-10-CM

## 2023-04-26 DIAGNOSIS — C7951 Secondary malignant neoplasm of bone: Secondary | ICD-10-CM | POA: Insufficient documentation

## 2023-04-26 DIAGNOSIS — C787 Secondary malignant neoplasm of liver and intrahepatic bile duct: Secondary | ICD-10-CM | POA: Insufficient documentation

## 2023-04-26 DIAGNOSIS — I700 Atherosclerosis of aorta: Secondary | ICD-10-CM | POA: Diagnosis not present

## 2023-04-26 DIAGNOSIS — Z170 Estrogen receptor positive status [ER+]: Secondary | ICD-10-CM | POA: Insufficient documentation

## 2023-04-26 DIAGNOSIS — Z9013 Acquired absence of bilateral breasts and nipples: Secondary | ICD-10-CM | POA: Diagnosis not present

## 2023-04-26 DIAGNOSIS — Z79811 Long term (current) use of aromatase inhibitors: Secondary | ICD-10-CM | POA: Diagnosis not present

## 2023-04-26 DIAGNOSIS — Z95828 Presence of other vascular implants and grafts: Secondary | ICD-10-CM

## 2023-04-26 DIAGNOSIS — Z923 Personal history of irradiation: Secondary | ICD-10-CM | POA: Insufficient documentation

## 2023-04-26 DIAGNOSIS — C778 Secondary and unspecified malignant neoplasm of lymph nodes of multiple regions: Secondary | ICD-10-CM | POA: Diagnosis not present

## 2023-04-26 DIAGNOSIS — K760 Fatty (change of) liver, not elsewhere classified: Secondary | ICD-10-CM | POA: Diagnosis not present

## 2023-04-26 DIAGNOSIS — Z7982 Long term (current) use of aspirin: Secondary | ICD-10-CM | POA: Insufficient documentation

## 2023-04-26 DIAGNOSIS — Z1501 Genetic susceptibility to malignant neoplasm of breast: Secondary | ICD-10-CM | POA: Insufficient documentation

## 2023-04-26 LAB — CMP (CANCER CENTER ONLY)
ALT: 41 U/L (ref 0–44)
AST: 34 U/L (ref 15–41)
Albumin: 4 g/dL (ref 3.5–5.0)
Alkaline Phosphatase: 81 U/L (ref 38–126)
Anion gap: 6 (ref 5–15)
BUN: 12 mg/dL (ref 6–20)
CO2: 29 mmol/L (ref 22–32)
Calcium: 8.9 mg/dL (ref 8.9–10.3)
Chloride: 105 mmol/L (ref 98–111)
Creatinine: 0.62 mg/dL (ref 0.44–1.00)
GFR, Estimated: >60 mL/min (ref >60)
Glucose, Bld: 153 mg/dL — ABNORMAL HIGH (ref 70–99)
Potassium: 4 mmol/L (ref 3.5–5.1)
Sodium: 140 mmol/L (ref 135–145)
Total Bilirubin: 0.4 mg/dL (ref 0.3–1.2)
Total Protein: 6.8 g/dL (ref 6.5–8.1)

## 2023-04-26 LAB — CBC WITH DIFFERENTIAL (CANCER CENTER ONLY)
Abs Immature Granulocytes: 0.01 10*3/uL (ref 0.00–0.07)
Basophils Absolute: 0 10*3/uL (ref 0.0–0.1)
Basophils Relative: 1 %
Eosinophils Absolute: 0.2 10*3/uL (ref 0.0–0.5)
Eosinophils Relative: 7 %
HCT: 36.2 % (ref 36.0–46.0)
Hemoglobin: 12.1 g/dL (ref 12.0–15.0)
Immature Granulocytes: 0 %
Lymphocytes Relative: 28 %
Lymphs Abs: 0.9 10*3/uL (ref 0.7–4.0)
MCH: 32.1 pg (ref 26.0–34.0)
MCHC: 33.4 g/dL (ref 30.0–36.0)
MCV: 96 fL (ref 80.0–100.0)
Monocytes Absolute: 0.4 10*3/uL (ref 0.1–1.0)
Monocytes Relative: 12 %
Neutro Abs: 1.7 10*3/uL (ref 1.7–7.7)
Neutrophils Relative %: 52 %
Platelet Count: 285 10*3/uL (ref 150–400)
RBC: 3.77 MIL/uL — ABNORMAL LOW (ref 3.87–5.11)
RDW: 14.2 % (ref 11.5–15.5)
WBC Count: 3.2 10*3/uL — ABNORMAL LOW (ref 4.0–10.5)
nRBC: 0 % (ref 0.0–0.2)

## 2023-04-26 MED ORDER — SODIUM CHLORIDE 0.9 % IV SOLN
150.0000 mg | Freq: Once | INTRAVENOUS | Status: AC
  Administered 2023-04-26: 150 mg via INTRAVENOUS
  Filled 2023-04-26: qty 150

## 2023-04-26 MED ORDER — DIPHENHYDRAMINE HCL 25 MG PO CAPS
25.0000 mg | ORAL_CAPSULE | Freq: Once | ORAL | Status: AC
  Administered 2023-04-26: 25 mg via ORAL
  Filled 2023-04-26: qty 1

## 2023-04-26 MED ORDER — HEPARIN SOD (PORK) LOCK FLUSH 100 UNIT/ML IV SOLN
500.0000 [IU] | Freq: Once | INTRAVENOUS | Status: AC | PRN
  Administered 2023-04-26: 500 [IU]

## 2023-04-26 MED ORDER — SODIUM CHLORIDE 0.9% FLUSH
10.0000 mL | INTRAVENOUS | Status: DC | PRN
  Administered 2023-04-26: 10 mL

## 2023-04-26 MED ORDER — SODIUM CHLORIDE 0.9 % IV SOLN
10.0000 mg | Freq: Once | INTRAVENOUS | Status: AC
  Administered 2023-04-26: 10 mg via INTRAVENOUS
  Filled 2023-04-26: qty 10

## 2023-04-26 MED ORDER — DEXTROSE 5 % IV SOLN: Freq: Once | INTRAVENOUS | Status: AC

## 2023-04-26 MED ORDER — PALONOSETRON HCL INJECTION 0.25 MG/5ML
0.2500 mg | Freq: Once | INTRAVENOUS | Status: AC
  Administered 2023-04-26: 0.25 mg via INTRAVENOUS
  Filled 2023-04-26: qty 5

## 2023-04-26 MED ORDER — SODIUM CHLORIDE 0.9% FLUSH
10.0000 mL | Freq: Once | INTRAVENOUS | Status: AC
  Administered 2023-04-26: 10 mL

## 2023-04-26 MED ORDER — FAM-TRASTUZUMAB DERUXTECAN-NXKI CHEMO 100 MG IV SOLR
3.2000 mg/kg | Freq: Once | INTRAVENOUS | Status: AC
  Administered 2023-04-26: 300 mg via INTRAVENOUS
  Filled 2023-04-26: qty 15

## 2023-04-26 MED ORDER — ACETAMINOPHEN 325 MG PO TABS
650.0000 mg | ORAL_TABLET | Freq: Once | ORAL | Status: AC
  Administered 2023-04-26: 650 mg via ORAL
  Filled 2023-04-26: qty 2

## 2023-04-26 NOTE — Patient Instructions (Signed)
[facility]  Discharge Instructions: Thank you for choosing [facility] to provide your oncology and hematology care.   If you have a lab appointment with the Cancer Center, please go directly to the Cancer Center and check in at the registration area.   Wear comfortable clothing and clothing appropriate for easy access to any Portacath or PICC line.   We strive to give you quality time with your provider. You may need to reschedule your appointment if you arrive late (15 or more minutes).  Arriving late affects you and other patients whose appointments are after yours.  Also, if you miss three or more appointments without notifying the office, you may be dismissed from the clinic at the provider's discretion.      For prescription refill requests, have your pharmacy contact our office and allow 72 hours for refills to be completed.    Today you received the following chemotherapy and/or immunotherapy agents: Enhertu      To help prevent nausea and vomiting after your treatment, we encourage you to take your nausea medication as directed.  BELOW ARE SYMPTOMS THAT SHOULD BE REPORTED IMMEDIATELY: *FEVER GREATER THAN 100.4 F (38 C) OR HIGHER *CHILLS OR SWEATING *NAUSEA AND VOMITING THAT IS NOT CONTROLLED WITH YOUR NAUSEA MEDICATION *UNUSUAL SHORTNESS OF BREATH *UNUSUAL BRUISING OR BLEEDING *URINARY PROBLEMS (pain or burning when urinating, or frequent urination) *BOWEL PROBLEMS (unusual diarrhea, constipation, pain near the anus) TENDERNESS IN MOUTH AND THROAT WITH OR WITHOUT PRESENCE OF ULCERS (sore throat, sores in mouth, or a toothache) UNUSUAL RASH, SWELLING OR PAIN  UNUSUAL VAGINAL DISCHARGE OR ITCHING   Items with * indicate a potential emergency and should be followed up as soon as possible or go to the Emergency Department if any problems should occur.  Please show the CHEMOTHERAPY ALERT CARD or IMMUNOTHERAPY ALERT CARD at  check-in to the Emergency Department and triage nurse.  Should you have questions after your visit or need to cancel or reschedule your appointment, please contact [facility]  Dept: [phone]  and follow the prompts.  Office hours are 8:00 a.m. to 4:30 p.m. Monday - Friday. Please note that voicemails left after 4:00 p.m. may not be returned until the following business day.  We are closed weekends and major holidays. You have access to a nurse at all times for urgent questions. Please call the main number to the clinic Dept: [phone] and follow the prompts.   For any non-urgent questions, you may also contact your provider using MyChart. We now offer e-Visits for anyone 36 and older to request care online for non-urgent symptoms. For details visit [email or web address].   Also download the MyChart app! Go to the app store, search "MyChart", open the app, select [facility], and log in with your MyChart username and password.

## 2023-04-26 NOTE — Assessment & Plan Note (Signed)
08/22/2019: PET CT scan: Metastatic disease in the chest with multiple lung nodules all subcentimeter size and hypermetabolic lymph nodes (right paratracheal node 7 mm, subcarinal node 1 cm, right hilar node 6 mm), retroperitoneal and upper pelvic common iliac lymph nodes, bone metastases especially L5, L4, T6, right 10th rib, left proximal femur   Bone biopsy was not felt to be reliable in addition to the effect of radiation on the bone. Palliative radiation completed 09/03/2019 [facility] 360: BRCA1 mutation, TMB 5.36, MSI: Normal   Prior treatment: Ibrance with letrozole and Xgeva for bone metastases switched to abemaciclib and Fulvestrant   Bone metastasis: Patient does not want to take bisphosphonates CT CAP 08/23/2022: New and enlarged pulmonary nodule consistent with progression.  Enlarged small low cervical and mediastinal nodes.  Multiple bone metastases minimally progressive.  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  Skull metastases, abnormal enhancement left globe  ---------------------------------------------------------------- Current treatment:: Enhertu cycle 9 Enhertu toxicities: Fatigue ANC 1.6 today: Okay to treat   CT CAP: 12/29/2022: Interval decrease in size of supraclavicular, mediastinal, abdominal, lung nodules, liver metastases and bone metastases.   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  Other scattered foci of brain enhancement unchanged resolved left posterior globe metastases CT CAP 03/12/2023: Improvement in pulmonary metastases.  No  thoracic lymphadenopathy.  Similar hepatic lesions Brain MRI 04/16/2023: Unchanged leptomeningeal enhancement.  Return to clinic in 3 weeks for cycle 10

## 2023-04-26 NOTE — Progress Notes (Signed)
Patient Care Team: Patient, [name] Serena Croissant, MD as Medical Oncologist (Hematology and Oncology) [name], MD as Consulting Physician (Radiation Oncology) [name], NP as Nurse Practitioner (Hematology and Oncology) [name], MD as Consulting Physician (General Surgery) [name], RPH-CPP as Pharmacist (Hematology and Oncology)  DIAGNOSIS:  Encounter Diagnoses  Name Primary?   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  BRCA1 p.[id number] VUS identified as well.  The report date is 04/02/2014.  UPDATE: BRCA1 X.[id number] VUS has been reclassified as a likely benign variant.  The reclassification date is 04/21/2020.   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 N0 M0 stage IA: Oncotype DX 13 (ROR 9%)   04/16/2014 Surgery   Left upper back melanoma resected no residual melanoma identified on re-resection margins negative   05/26/2014 - 06/08/2015 Anti-estrogen oral therapy   Tamoxifen 20 mg daily with a plan to switch her to aromatase inhibitors once she gets oophorectomy ( patient did not continue antiestrogen therapy by choice)   08/13/2014 Surgery   oophorectomy with total abdominal hysterectomy   09/08/2015 -  Anti-estrogen oral therapy   Resumed anastrozole after she found recurrence of breast cancer; stopped in January 2018, started letrozole 2.5 mg daily 10/12/2016; switched to  Exemestane 25 mg daily on 01/03/17   09/16/2015 Surgery   Skin, left: IDC grade 3; 0.5 cm, margins negative, ER 95%, PR 10%, HER-2 positive ratio 2.08   11/08/2015 - 02/2017 Chemotherapy   Herceptin every 3 weeks plus anastrozole   11/24/2015 - 01/14/2016 Radiation Therapy   Adjuvant radiation therapy by Dr. Roselind Messier   01/2017 -  Anti-estrogen oral therapy   Exemestane daily (stopped others due to side effects), due to muscle aches and pains switch to tamoxifen 5 mg daily starting 12/18/2017   08/07/2019 Relapse/Recurrence   Bone scan: Sclerotic metastatic lesions involving left femoral neck, right ninth rib and entire L5 vertebra; MRI left hip: Diffuse signal abnormality L5 vertebral body with possible extraosseous extension   08/22/2019 - 09/03/2019 Radiation Therapy   Palliative radiation with Dr. Roselind Messier to L5 and left proixmal femur.    10/2021 Treatment Plan Change   Fulvestrant and Verzenio   08/23/2022 Imaging   CT CAP 08/23/2022: New and enlarged pulmonary nodule consistent with progression.  Enlarged small low cervical and mediastinal nodes.  Multiple bone metastases minimally progressive.  Liver lesions evaluation is challenging because of hepatic steatosis with suspected liver progression, isolated tiny omental nodule     09/07/2022 PET scan   IMPRESSION: 1. Worsening of disease now with nodal, pulmonary and hepatic metastases. 2. Areas of variable FDG uptake with some new bone lesions and some areas of bony metastasis which are more focal, for instance in the pelvis where there was likely diffuse involvement on prior imaging. Many of the areas of sclerosis do not show substantial uptake as outlined above and some areas show improvement/resolution of FDG uptake seen previously but there are signs of active  bony metastatic disease currently. 3. Likely LEFT adrenal adenoma. 4. Severe hepatic steatosis. 5. Aortic atherosclerosis.   Primary malignant neoplasm of breast with metastasis  (HCC)  08/25/2019 Initial Diagnosis   Primary malignant neoplasm of breast with metastasis (HCC)   10/11/2022 -  Chemotherapy   Patient is on Treatment Plan : BREAST METASTATIC Fam-Trastuzumab Deruxtecan-nxki (Enhertu) (5.4) q21d       CHIEF COMPLIANT: cycle 9 Enhertu  Discussed the use of AI scribe software for clinical note transcription with the patient, who gave verbal consent to proceed.  History of Present Illness   The patient, with a history of Metastatic breast cancer with liver lesions , presents for cycle 9 Enhertu++. She reports that the fatigue is not severe and she has been managing it with B12 drops, which she feels have been helpful. She also mentions a vague nodule in the liver, which has been stable. The patient has been following up regularly for her condition and her last CT scan was in August. She is due for another scan in November.         ALLERGIES:  is allergic to dilaudid [hydromorphone] and codeine.  MEDICATIONS:  Current Outpatient Medications  Medication Sig Dispense Refill   aspirin EC 81 MG tablet Take 1 tablet (81 mg total) by mouth daily. Swallow whole. 90 tablet 3   aspirin-acetaminophen-caffeine (EXCEDRIN MIGRAINE) 250-250-65 MG tablet Take 2 tablets by mouth every 6 (six) hours as needed for headache.     CALCIUM PO Take 1,200 mg by mouth 2 (two) times daily.     dexamethasone (DECADRON) 4 MG tablet Take 1 tablets (4 mg) by mouth daily for 3 days after chemotherapy. Take with food. 30 tablet 1   esomeprazole (NEXIUM) 40 MG capsule Take 40 mg by mouth daily.     lidocaine-prilocaine (EMLA) cream Apply to affected area once 30 g 3   loperamide (IMODIUM) 2 MG capsule Take 2 mg by mouth daily as needed for diarrhea or loose stools. Take 2 tablets at first diarrhea and then 1 tablet after additional loose stool. Max number in 24 hours is 8 tablets.     loratadine (CLARITIN) 10 MG tablet Take 10 mg by mouth daily.     meloxicam (MOBIC) 15 MG tablet TAKE 1  TABLET(15 MG) BY MOUTH DAILY AS NEEDED FOR PAIN 30 tablet 1   ondansetron (ZOFRAN) 8 MG tablet Take 1 tablet (8 mg total) by mouth every 8 (eight) hours as needed for nausea or vomiting. Start on the third day after chemotherapy. 30 tablet 1   prochlorperazine (COMPAZINE) 10 MG tablet Take 1 tablet (10 mg total) by mouth every 6 (six) hours as needed for nausea or vomiting. 30 tablet 1   promethazine (PHENERGAN) 25 MG suppository Place 1 suppository (25 mg total) rectally every 6 (six) hours as needed for nausea or vomiting. 12 each 0   rosuvastatin (CRESTOR) 10 MG tablet Take 1 tablet (10 mg total) by mouth daily. 90 tablet 3   traMADol (ULTRAM) 50 MG tablet Take 1 tablet (50 mg total) by mouth every 6 (six) hours as needed. 10 tablet 0   traZODone (DESYREL) 50 MG tablet TAKE 1 TABLET(50 MG) BY MOUTH AT BEDTIME 90 tablet 0   venlafaxine XR (EFFEXOR-XR) 75 MG 24 hr capsule TAKE 1 CAPSULE(75 MG) BY MOUTH AT BEDTIME 90 capsule 3   dicyclomine (BENTYL) 20 MG tablet Take 1 tablet (20 mg total) by mouth every 6 (six) hours as needed for up to 7  days for spasms. 28 tablet 0   No current facility-administered medications for this visit.    PHYSICAL EXAMINATION: ECOG PERFORMANCE STATUS: 1 - Symptomatic but completely ambulatory  Vitals:   04/26/23 1425  BP: 133/79  Pulse: 87  Resp: 18  Temp: 97.8 F (36.6 C)  SpO2: 99%   Filed Weights   04/26/23 1425  Weight: 225 lb 12.8 oz (102.4 kg)    Physical Exam          (exam performed in the presence of a chaperone)  LABORATORY DATA:  I have reviewed the data as listed    Latest Ref Rng & Units 04/05/2023    2:49 PM 03/15/2023    2:02 PM 02/21/2023    2:25 PM  CMP  Glucose 70 - 99 mg/dL 629  528  91   BUN 6 - 20 mg/dL 8  9  9    Creatinine 0.44 - 1.00 mg/dL 4.13  2.44  0.10   Sodium 135 - 145 mmol/L 142  140  140   Potassium 3.5 - 5.1 mmol/L 4.3  4.1  4.0   Chloride 98 - 111 mmol/L 105  104  103   CO2 22 - 32 mmol/L 31  31  28    Calcium  8.9 - 10.3 mg/dL 9.0  8.8  9.4   Total Protein 6.5 - 8.1 g/dL 7.0  7.0  7.1   Total Bilirubin 0.3 - 1.2 mg/dL 0.3  0.4  0.5   Alkaline Phos 38 - 126 U/L 82  84  90   AST 15 - 41 U/L 27  34  27   ALT 0 - 44 U/L 41  44  37     Lab Results  Component Value Date   WBC 3.2 (L) 04/26/2023   HGB 12.1 04/26/2023   HCT 36.2 04/26/2023   MCV 96.0 04/26/2023   PLT 285 04/26/2023   NEUTROABS 1.7 04/26/2023    ASSESSMENT & PLAN:  Cancer of central portion of left female breast (HCC) 08/22/2019: PET CT scan: Metastatic disease in the chest with multiple lung nodules all subcentimeter size and hypermetabolic lymph nodes (right paratracheal node 7 mm, subcarinal node 1 cm, right hilar node 6 mm), retroperitoneal and upper pelvic common iliac lymph nodes, bone metastases especially L5, L4, T6, right 10th rib, left proximal femur   Bone biopsy was not felt to be reliable in addition to the effect of radiation on the bone. Palliative radiation completed 09/03/2019 [facility] 360: BRCA1 mutation, TMB 5.36, MSI: Normal   Prior treatment: Ibrance with letrozole and Xgeva for bone metastases switched to abemaciclib and Fulvestrant   Bone metastasis: Patient does not want to take bisphosphonates CT CAP 08/23/2022: New and enlarged pulmonary nodule consistent with progression.  Enlarged small low cervical and mediastinal nodes.  Multiple bone metastases minimally progressive.  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  Skull metastases, abnormal enhancement left globe  ---------------------------------------------------------------- Current treatment::  Enhertu cycle 9 Enhertu toxicities: Fatigue ANC 1.6 today: Okay to treat   CT CAP: 12/29/2022: Interval decrease in size of supraclavicular, mediastinal, abdominal, lung nodules, liver metastases and bone metastases.   Brain MRI 12/29/2022: Stable right vertex pachymeningeal and right central  sulcus leptomeningeal enhancing metastases.  Other scattered foci of brain enhancement unchanged resolved left posterior globe metastases CT CAP 03/12/2023: Improvement in pulmonary metastases.  No thoracic lymphadenopathy.  Similar hepatic lesions Brain MRI 04/16/2023: Unchanged leptomeningeal enhancement.  Return to clinic in 3 weeks for cycle 10 ------------------------------------- Assessment and Plan    Liver Lesions Stable size of lesions on recent CT scan. No new lesions identified. -Continue current treatment regimen. -Next CT scan scheduled for June 22, 2023.  Fatigue Patient reports improvement with B12 drops. -Continue B12 drops as needed for fatigue.  Follow-up Next appointment scheduled for May 17, 2023.          Orders Placed This Encounter  Procedures   CT CHEST ABDOMEN PELVIS W CONTRAST    Standing Status:   Future    Standing Expiration Date:   04/25/2024    Order Specific Question:   If indicated for the ordered procedure, I authorize the administration of contrast media per Radiology protocol    Answer:   Yes    Order Specific Question:   Does the patient have a contrast media/X-ray dye allergy?    Answer:   No    Order Specific Question:   Is patient pregnant?    Answer:   No    Order Specific Question:   Preferred imaging location?    Answer:   [facility]    Order Specific Question:   Release to patient    Answer:   Immediate    Order Specific Question:   If indicated for the ordered procedure, I authorize the administration of oral contrast media per Radiology protocol    Answer:   Yes   The patient has a good understanding of the overall plan.  she agrees with it. she will call with any problems that may develop before the next visit here. Total time spent: 30 mins including face to face time and time spent for planning, charting and co-ordination of care   Tamsen Meek, MD 04/26/23

## 2023-05-03 ENCOUNTER — Other Ambulatory Visit: Payer: Self-pay | Admitting: Radiation Therapy

## 2023-05-03 DIAGNOSIS — C50919 Malignant neoplasm of unspecified site of unspecified female breast: Secondary | ICD-10-CM

## 2023-05-09 ENCOUNTER — Other Ambulatory Visit: Payer: Self-pay

## 2023-05-16 MED FILL — Dexamethasone Sodium Phosphate Inj 100 MG/10ML: INTRAMUSCULAR | Qty: 1 | Status: AC

## 2023-05-16 MED FILL — Fosaprepitant Dimeglumine For IV Infusion 150 MG (Base Eq): INTRAVENOUS | Qty: 5 | Status: AC

## 2023-05-17 ENCOUNTER — Inpatient Hospital Stay: Payer: Commercial Managed Care - PPO | Admitting: *Deleted

## 2023-05-17 ENCOUNTER — Ambulatory Visit: Payer: Commercial Managed Care - PPO | Admitting: Hematology and Oncology

## 2023-05-17 ENCOUNTER — Inpatient Hospital Stay: Payer: Commercial Managed Care - PPO | Attending: Hematology and Oncology

## 2023-05-17 ENCOUNTER — Other Ambulatory Visit: Payer: Commercial Managed Care - PPO

## 2023-05-17 ENCOUNTER — Inpatient Hospital Stay: Payer: Commercial Managed Care - PPO | Admitting: Hematology and Oncology

## 2023-05-17 ENCOUNTER — Ambulatory Visit: Payer: Commercial Managed Care - PPO

## 2023-05-17 ENCOUNTER — Inpatient Hospital Stay: Payer: Commercial Managed Care - PPO

## 2023-05-17 VITALS — BP 133/75 | HR 69 | Resp 16

## 2023-05-17 VITALS — BP 131/72 | HR 81 | Temp 97.9°F | Resp 18 | Ht 64.02 in | Wt 227.4 lb

## 2023-05-17 DIAGNOSIS — Z7952 Long term (current) use of systemic steroids: Secondary | ICD-10-CM | POA: Diagnosis not present

## 2023-05-17 DIAGNOSIS — C7951 Secondary malignant neoplasm of bone: Secondary | ICD-10-CM | POA: Diagnosis present

## 2023-05-17 DIAGNOSIS — C50112 Malignant neoplasm of central portion of left female breast: Secondary | ICD-10-CM

## 2023-05-17 DIAGNOSIS — I700 Atherosclerosis of aorta: Secondary | ICD-10-CM | POA: Insufficient documentation

## 2023-05-17 DIAGNOSIS — Z1501 Genetic susceptibility to malignant neoplasm of breast: Secondary | ICD-10-CM | POA: Insufficient documentation

## 2023-05-17 DIAGNOSIS — C787 Secondary malignant neoplasm of liver and intrahepatic bile duct: Secondary | ICD-10-CM | POA: Diagnosis not present

## 2023-05-17 DIAGNOSIS — C4359 Malignant melanoma of other part of trunk: Secondary | ICD-10-CM | POA: Diagnosis not present

## 2023-05-17 DIAGNOSIS — R5383 Other fatigue: Secondary | ICD-10-CM | POA: Insufficient documentation

## 2023-05-17 DIAGNOSIS — C50919 Malignant neoplasm of unspecified site of unspecified female breast: Secondary | ICD-10-CM

## 2023-05-17 DIAGNOSIS — Z79811 Long term (current) use of aromatase inhibitors: Secondary | ICD-10-CM | POA: Insufficient documentation

## 2023-05-17 DIAGNOSIS — K760 Fatty (change of) liver, not elsewhere classified: Secondary | ICD-10-CM | POA: Diagnosis not present

## 2023-05-17 DIAGNOSIS — Z5111 Encounter for antineoplastic chemotherapy: Secondary | ICD-10-CM | POA: Insufficient documentation

## 2023-05-17 DIAGNOSIS — Z170 Estrogen receptor positive status [ER+]: Secondary | ICD-10-CM

## 2023-05-17 DIAGNOSIS — K5900 Constipation, unspecified: Secondary | ICD-10-CM | POA: Insufficient documentation

## 2023-05-17 DIAGNOSIS — Z7982 Long term (current) use of aspirin: Secondary | ICD-10-CM | POA: Insufficient documentation

## 2023-05-17 DIAGNOSIS — Z79899 Other long term (current) drug therapy: Secondary | ICD-10-CM | POA: Diagnosis not present

## 2023-05-17 LAB — CMP (CANCER CENTER ONLY)
ALT: 36 U/L (ref 0–44)
AST: 25 U/L (ref 15–41)
Albumin: 4 g/dL (ref 3.5–5.0)
Alkaline Phosphatase: 79 U/L (ref 38–126)
Anion gap: 6 (ref 5–15)
BUN: 14 mg/dL (ref 6–20)
CO2: 29 mmol/L (ref 22–32)
Calcium: 9.3 mg/dL (ref 8.9–10.3)
Chloride: 106 mmol/L (ref 98–111)
Creatinine: 0.57 mg/dL (ref 0.44–1.00)
GFR, Estimated: >60 mL/min (ref >60)
Glucose, Bld: 82 mg/dL (ref 70–99)
Potassium: 4.2 mmol/L (ref 3.5–5.1)
Sodium: 141 mmol/L (ref 135–145)
Total Bilirubin: 0.3 mg/dL (ref 0.3–1.2)
Total Protein: 6.6 g/dL (ref 6.5–8.1)

## 2023-05-17 LAB — CBC WITH DIFFERENTIAL (CANCER CENTER ONLY)
Abs Immature Granulocytes: 0.01 10*3/uL (ref 0.00–0.07)
Basophils Absolute: 0 10*3/uL (ref 0.0–0.1)
Basophils Relative: 1 %
Eosinophils Absolute: 0.2 10*3/uL (ref 0.0–0.5)
Eosinophils Relative: 7 %
HCT: 35.9 % — ABNORMAL LOW (ref 36.0–46.0)
Hemoglobin: 12.2 g/dL (ref 12.0–15.0)
Immature Granulocytes: 0 %
Lymphocytes Relative: 36 %
Lymphs Abs: 1.2 10*3/uL (ref 0.7–4.0)
MCH: 32.4 pg (ref 26.0–34.0)
MCHC: 34 g/dL (ref 30.0–36.0)
MCV: 95.5 fL (ref 80.0–100.0)
Monocytes Absolute: 0.4 10*3/uL (ref 0.1–1.0)
Monocytes Relative: 13 %
Neutro Abs: 1.5 10*3/uL — ABNORMAL LOW (ref 1.7–7.7)
Neutrophils Relative %: 43 %
Platelet Count: 274 10*3/uL (ref 150–400)
RBC: 3.76 MIL/uL — ABNORMAL LOW (ref 3.87–5.11)
RDW: 14.2 % (ref 11.5–15.5)
WBC Count: 3.4 10*3/uL — ABNORMAL LOW (ref 4.0–10.5)
nRBC: 0 % (ref 0.0–0.2)

## 2023-05-17 MED ORDER — PALONOSETRON HCL INJECTION 0.25 MG/5ML
0.2500 mg | Freq: Once | INTRAVENOUS | Status: AC
  Administered 2023-05-17: 0.25 mg via INTRAVENOUS
  Filled 2023-05-17: qty 5

## 2023-05-17 MED ORDER — SODIUM CHLORIDE 0.9 % IV SOLN
10.0000 mg | Freq: Once | INTRAVENOUS | Status: AC
  Administered 2023-05-17: 10 mg via INTRAVENOUS
  Filled 2023-05-17: qty 10

## 2023-05-17 MED ORDER — DEXTROSE 5 % IV SOLN: Freq: Once | INTRAVENOUS | Status: AC

## 2023-05-17 MED ORDER — SODIUM CHLORIDE 0.9% FLUSH
10.0000 mL | INTRAVENOUS | Status: DC | PRN
  Administered 2023-05-17: 10 mL

## 2023-05-17 MED ORDER — SODIUM CHLORIDE 0.9 % IV SOLN
150.0000 mg | Freq: Once | INTRAVENOUS | Status: AC
  Administered 2023-05-17: 150 mg via INTRAVENOUS
  Filled 2023-05-17: qty 150

## 2023-05-17 MED ORDER — DIPHENHYDRAMINE HCL 25 MG PO CAPS
25.0000 mg | ORAL_CAPSULE | Freq: Once | ORAL | Status: AC
  Administered 2023-05-17: 25 mg via ORAL
  Filled 2023-05-17: qty 1

## 2023-05-17 MED ORDER — FAM-TRASTUZUMAB DERUXTECAN-NXKI CHEMO 100 MG IV SOLR
3.2000 mg/kg | Freq: Once | INTRAVENOUS | Status: AC
  Administered 2023-05-17: 300 mg via INTRAVENOUS
  Filled 2023-05-17: qty 15

## 2023-05-17 MED ORDER — ACETAMINOPHEN 325 MG PO TABS
650.0000 mg | ORAL_TABLET | Freq: Once | ORAL | Status: AC
  Administered 2023-05-17: 650 mg via ORAL
  Filled 2023-05-17: qty 2

## 2023-05-17 MED ORDER — ALPRAZOLAM 0.25 MG PO TABS: 0.2500 mg | ORAL_TABLET | Freq: Every evening | ORAL | 3 refills | Status: AC | PRN

## 2023-05-17 MED ORDER — HEPARIN SOD (PORK) LOCK FLUSH 100 UNIT/ML IV SOLN
500.0000 [IU] | Freq: Once | INTRAVENOUS | Status: AC | PRN
  Administered 2023-05-17: 500 [IU]

## 2023-05-17 NOTE — Assessment & Plan Note (Signed)
08/22/2019: PET CT scan: Metastatic disease in the chest with multiple lung nodules all subcentimeter size and hypermetabolic lymph nodes (right paratracheal node 7 mm, subcarinal node 1 cm, right hilar node 6 mm), retroperitoneal and upper pelvic common iliac lymph nodes, bone metastases especially L5, L4, T6, right 10th rib, left proximal femur   Bone biopsy was not felt to be reliable in addition to the effect of radiation on the bone. Palliative radiation completed 09/03/2019 [facility] 360: BRCA1 mutation, TMB 5.36, MSI: Normal   Prior treatment: Ibrance with letrozole and Xgeva for bone metastases switched to abemaciclib and Fulvestrant   Bone metastasis: Patient does not want to take bisphosphonates CT CAP 08/23/2022: New and enlarged pulmonary nodule consistent with progression.  Enlarged small low cervical and mediastinal nodes.  Multiple bone metastases minimally progressive.  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  Skull metastases, abnormal enhancement left globe  ---------------------------------------------------------------- Current treatment:: Enhertu cycle 10 Enhertu toxicities: Fatigue ANC 1.6 today: Okay to treat  CT CAP 03/12/2023: Improvement in pulmonary metastases.  No thoracic lymphadenopathy.  Similar hepatic lesions Brain MRI 04/16/2023: Unchanged leptomeningeal enhancement.   Return to clinic in 3 weeks for cycle 11

## 2023-05-17 NOTE — Progress Notes (Signed)
[facility] CSW Progress Note  Clinical Child psychotherapist met with patient in exam room prior to medical oncologist visit.  [name] shared she is looking for affordable housing and is considered moving closer to her work and the cancer center. She currently lives with her boyfriend and has an hour commute. CSW and patient discussed limited affordable housing resources in the area.  Patient's medical oncologist entered for appointment- CSW team will follow up to discuss possible list of housing options.   Krystal Riley, LCSW Clinical Social Worker [facility]

## 2023-05-17 NOTE — Patient Instructions (Signed)
[name]  Discharge Instructions: Thank you for choosing [facility] to provide your oncology and hematology care.   If you have a lab appointment with the Cancer Center, please go directly to the Cancer Center and check in at the registration area.   Wear comfortable clothing and clothing appropriate for easy access to any Portacath or PICC line.   We strive to give you quality time with your provider. You may need to reschedule your appointment if you arrive late (15 or more minutes).  Arriving late affects you and other patients whose appointments are after yours.  Also, if you miss three or more appointments without notifying the office, you may be dismissed from the clinic at the provider's discretion.      For prescription refill requests, have your pharmacy contact our office and allow 72 hours for refills to be completed.    Today you received the following chemotherapy and/or immunotherapy agents Enhertu.   To help prevent nausea and vomiting after your treatment, we encourage you to take your nausea medication as directed.  BELOW ARE SYMPTOMS THAT SHOULD BE REPORTED IMMEDIATELY: *FEVER GREATER THAN 100.4 F (38 C) OR HIGHER *CHILLS OR SWEATING *NAUSEA AND VOMITING THAT IS NOT CONTROLLED WITH YOUR NAUSEA MEDICATION *UNUSUAL SHORTNESS OF BREATH *UNUSUAL BRUISING OR BLEEDING *URINARY PROBLEMS (pain or burning when urinating, or frequent urination) *BOWEL PROBLEMS (unusual diarrhea, constipation, pain near the anus) TENDERNESS IN MOUTH AND THROAT WITH OR WITHOUT PRESENCE OF ULCERS (sore throat, sores in mouth, or a toothache) UNUSUAL RASH, SWELLING OR PAIN  UNUSUAL VAGINAL DISCHARGE OR ITCHING   Items with * indicate a potential emergency and should be followed up as soon as possible or go to the Emergency Department if any problems should occur.  Please show the CHEMOTHERAPY ALERT CARD or IMMUNOTHERAPY ALERT CARD at check-in  to the Emergency Department and triage nurse.  Should you have questions after your visit or need to cancel or reschedule your appointment, please contact [facility]  Dept: [phone]  and follow the prompts.  Office hours are 8:00 a.m. to 4:30 p.m. Monday - Friday. Please note that voicemails left after 4:00 p.m. may not be returned until the following business day.  We are closed weekends and major holidays. You have access to a nurse at all times for urgent questions. Please call the main number to the clinic Dept: [phone] and follow the prompts.   For any non-urgent questions, you may also contact your provider using MyChart. We now offer e-Visits for anyone 18 and older to request care online for non-urgent symptoms. For details visit [email or web address].   Also download the MyChart app! Go to the app store, search "MyChart", open the app, select [facility], and log in with your MyChart username and password.   

## 2023-05-18 ENCOUNTER — Telehealth: Payer: Self-pay

## 2023-05-18 ENCOUNTER — Encounter: Payer: Self-pay | Admitting: Hematology and Oncology

## 2023-05-18 NOTE — Progress Notes (Signed)
Patient Care Team: Patient, [name] Serena Croissant, MD as Medical Oncologist (Hematology and Oncology) [name], MD as Consulting Physician (Radiation Oncology) [name], NP as Nurse Practitioner (Hematology and Oncology) [name], MD as Consulting Physician (General Surgery) [name], RPH-CPP as Pharmacist (Hematology and Oncology)  DIAGNOSIS:  Encounter Diagnoses  Name Primary?   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  BRCA1 p.[id number] VUS identified as well.  The report date is 04/02/2014.  UPDATE: BRCA1 G.[id number] VUS has been reclassified as a likely benign variant.  The reclassification date is 04/21/2020.   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 N0 M0 stage IA: Oncotype DX 13 (ROR 9%)   04/16/2014 Surgery   Left upper back melanoma resected no residual melanoma identified on re-resection margins negative   05/26/2014 - 06/08/2015 Anti-estrogen oral therapy   Tamoxifen 20 mg daily with a plan to switch her to aromatase inhibitors once she gets oophorectomy ( patient did not continue antiestrogen therapy by choice)   08/13/2014 Surgery   oophorectomy with total abdominal hysterectomy   09/08/2015 -  Anti-estrogen oral therapy   Resumed anastrozole after she found recurrence of breast cancer; stopped in January 2018, started letrozole 2.5 mg daily 10/12/2016; switched to  Exemestane 25 mg daily on 01/03/17   09/16/2015 Surgery   Skin, left: IDC grade 3; 0.5 cm, margins negative, ER 95%, PR 10%, HER-2 positive ratio 2.08   11/08/2015 - 02/2017 Chemotherapy   Herceptin every 3 weeks plus anastrozole   11/24/2015 - 01/14/2016 Radiation Therapy   Adjuvant radiation therapy by Dr. Roselind Messier   01/2017 -  Anti-estrogen oral therapy   Exemestane daily (stopped others due to side effects), due to muscle aches and pains switch to tamoxifen 5 mg daily starting 12/18/2017   08/07/2019 Relapse/Recurrence   Bone scan: Sclerotic metastatic lesions involving left femoral neck, right ninth rib and entire L5 vertebra; MRI left hip: Diffuse signal abnormality L5 vertebral body with possible extraosseous extension   08/22/2019 - 09/03/2019 Radiation Therapy   Palliative radiation with Dr. Roselind Messier to L5 and left proixmal femur.    10/2021 Treatment Plan Change   Fulvestrant and Verzenio   08/23/2022 Imaging   CT CAP 08/23/2022: New and enlarged pulmonary nodule consistent with progression.  Enlarged small low cervical and mediastinal nodes.  Multiple bone metastases minimally progressive.  Liver lesions evaluation is challenging because of hepatic steatosis with suspected liver progression, isolated tiny omental nodule     09/07/2022 PET scan   IMPRESSION: 1. Worsening of disease now with nodal, pulmonary and hepatic metastases. 2. Areas of variable FDG uptake with some new bone lesions and some areas of bony metastasis which are more focal, for instance in the pelvis where there was likely diffuse involvement on prior imaging. Many of the areas of sclerosis do not show substantial uptake as outlined above and some areas show improvement/resolution of FDG uptake seen previously but there are signs of active  bony metastatic disease currently. 3. Likely LEFT adrenal adenoma. 4. Severe hepatic steatosis. 5. Aortic atherosclerosis.   Primary malignant neoplasm of breast with metastasis  (HCC)  08/25/2019 Initial Diagnosis   Primary malignant neoplasm of breast with metastasis (HCC)   10/11/2022 -  Chemotherapy   Patient is on Treatment Plan : BREAST METASTATIC Fam-Trastuzumab Deruxtecan-nxki (Enhertu) (5.4) q21d       CHIEF COMPLIANT: Follow-up on Enhertu  History of Present Illness   The patient, with a history of Metastatic breast cancer is here for Enhertu treatment. She is expressing increased anxiety symptoms due to a stressful living situation. The patient's boyfriend's mother is planning to move back into her shared home, causing the patient significant distress. The patient describes experiencing panic attacks and heart palpitations. The patient is also dealing with the stress of finding a new place to live, expressing concerns about the cost and location of potential new homes. The patient is currently taking Alprazolam (Xanax) as needed for anxiety, but the medication was prescribed over a year ago. The patient also mentions having constipation, possibly related to her medication.     ALLERGIES:  is allergic to dilaudid [hydromorphone] and codeine.  MEDICATIONS:  Current Outpatient Medications  Medication Sig Dispense Refill   ALPRAZolam (XANAX) 0.25 MG tablet Take 1 tablet (0.25 mg total) by mouth at bedtime as needed for anxiety. 30 tablet 3   aspirin EC 81 MG tablet Take 1 tablet (81 mg total) by mouth daily. Swallow whole. 90 tablet 3   aspirin-acetaminophen-caffeine (EXCEDRIN MIGRAINE) 250-250-65 MG tablet Take 2 tablets by mouth every 6 (six) hours as needed for headache.     CALCIUM PO Take 1,200 mg by mouth 2 (two) times daily.     dexamethasone (DECADRON) 4 MG tablet Take 1 tablets (4 mg) by mouth daily for 3 days after chemotherapy. Take with food. 30 tablet 1   esomeprazole (NEXIUM) 40 MG capsule Take 40 mg by mouth daily.     lidocaine-prilocaine (EMLA) cream Apply to affected area once 30 g 3   loperamide (IMODIUM) 2 MG capsule Take 2 mg by mouth daily  as needed for diarrhea or loose stools. Take 2 tablets at first diarrhea and then 1 tablet after additional loose stool. Max number in 24 hours is 8 tablets.     loratadine (CLARITIN) 10 MG tablet Take 10 mg by mouth daily.     meloxicam (MOBIC) 15 MG tablet TAKE 1 TABLET(15 MG) BY MOUTH DAILY AS NEEDED FOR PAIN 30 tablet 1   ondansetron (ZOFRAN) 8 MG tablet Take 1 tablet (8 mg total) by mouth every 8 (eight) hours as needed for nausea or vomiting. Start on the third day after chemotherapy. 30 tablet 1   prochlorperazine (COMPAZINE) 10 MG tablet Take 1 tablet (10 mg total) by mouth every 6 (six) hours as needed for nausea or vomiting. 30 tablet 1   promethazine (PHENERGAN) 25 MG suppository Place 1 suppository (25 mg total) rectally every 6 (six) hours as needed for nausea or vomiting. 12 each 0   rosuvastatin (CRESTOR) 10 MG tablet Take 1 tablet (10 mg total) by mouth daily. 90 tablet 3   traMADol (ULTRAM) 50 MG tablet Take 1 tablet (50 mg total) by mouth every 6 (six) hours as needed. 10 tablet 0   traZODone (DESYREL) 50 MG tablet TAKE 1 TABLET(50 MG) BY MOUTH AT BEDTIME 90 tablet 0   venlafaxine XR (EFFEXOR-XR) 75 MG 24 hr capsule TAKE 1 CAPSULE(75 MG) BY MOUTH AT  BEDTIME 90 capsule 3   dicyclomine (BENTYL) 20 MG tablet Take 1 tablet (20 mg total) by mouth every 6 (six) hours as needed for up to 7 days for spasms. 28 tablet 0   No current facility-administered medications for this visit.    PHYSICAL EXAMINATION: ECOG PERFORMANCE STATUS: 1 - Symptomatic but completely ambulatory  Vitals:   05/17/23 1444  BP: 131/72  Pulse: 81  Resp: 18  Temp: 97.9 F (36.6 C)  SpO2: 96%   Filed Weights   05/17/23 1444  Weight: 227 lb 6.4 oz (103.1 kg)      LABORATORY DATA:  I have reviewed the data as listed    Latest Ref Rng & Units 05/17/2023    2:16 PM 04/26/2023    1:56 PM 04/05/2023    2:49 PM  CMP  Glucose 70 - 99 mg/dL 82  578  469   BUN 6 - 20 mg/dL 14  12  8    Creatinine 0.44 -  1.00 mg/dL 6.29  5.28  4.13   Sodium 135 - 145 mmol/L 141  140  142   Potassium 3.5 - 5.1 mmol/L 4.2  4.0  4.3   Chloride 98 - 111 mmol/L 106  105  105   CO2 22 - 32 mmol/L 29  29  31    Calcium 8.9 - 10.3 mg/dL 9.3  8.9  9.0   Total Protein 6.5 - 8.1 g/dL 6.6  6.8  7.0   Total Bilirubin 0.3 - 1.2 mg/dL 0.3  0.4  0.3   Alkaline Phos 38 - 126 U/L 79  81  82   AST 15 - 41 U/L 25  34  27   ALT 0 - 44 U/L 36  41  41     Lab Results  Component Value Date   WBC 3.4 (L) 05/17/2023   HGB 12.2 05/17/2023   HCT 35.9 (L) 05/17/2023   MCV 95.5 05/17/2023   PLT 274 05/17/2023   NEUTROABS 1.5 (L) 05/17/2023    ASSESSMENT & PLAN:  Cancer of central portion of left female breast (HCC) 08/22/2019: PET CT scan: Metastatic disease in the chest with multiple lung nodules all subcentimeter size and hypermetabolic lymph nodes (right paratracheal node 7 mm, subcarinal node 1 cm, right hilar node 6 mm), retroperitoneal and upper pelvic common iliac lymph nodes, bone metastases especially L5, L4, T6, right 10th rib, left proximal femur   Bone biopsy was not felt to be reliable in addition to the effect of radiation on the bone. Palliative radiation completed 09/03/2019 [facility] 360: BRCA1 mutation, TMB 5.36, MSI: Normal   Prior treatment: Ibrance with letrozole and Xgeva for bone metastases switched to abemaciclib and Fulvestrant   Bone metastasis: Patient does not want to take bisphosphonates CT CAP 08/23/2022: New and enlarged pulmonary nodule consistent with progression.  Enlarged small low cervical and mediastinal nodes.  Multiple bone metastases minimally progressive.  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  Skull metastases, abnormal enhancement left globe  ---------------------------------------------------------------- Current treatment:: Enhertu cycle 10 Enhertu toxicities: Fatigue ANC 1.6 today: Okay to treat  CT CAP 03/12/2023: Improvement in pulmonary metastases.  No thoracic lymphadenopathy.  Similar hepatic lesions Brain MRI 04/16/2023:  Unchanged leptomeningeal enhancement.   Anxiety Severe anxiety related to housing instability and interpersonal conflict. Old prescription of Alprazolam (Xanax) from 2021 still in use. -Write new prescription for Alprazolam. -Refer to Child psychotherapist for assistance with housing search.  Return to clinic in 3 weeks for cycle 11    No orders of the defined types were placed in this encounter.  The patient has a good understanding of the overall plan. she agrees with it. she will call with any problems that may develop before the next visit here. Total time spent: 30 mins including face to face time and time spent for planning, charting and co-ordination of care   Tamsen Meek, MD 05/18/23

## 2023-05-18 NOTE — Telephone Encounter (Signed)
[facility] CSW Progress Note  Clinical Social Worker  attempted to contact patient  to follow up on patient's CSW visit yesterday with L. Somers. No answer, left vm with direct contact information.     Marguerita Merles, LCSWA Clinical Social Worker [facility]

## 2023-05-22 ENCOUNTER — Other Ambulatory Visit: Payer: Self-pay

## 2023-05-23 ENCOUNTER — Telehealth: Payer: Self-pay

## 2023-05-23 ENCOUNTER — Other Ambulatory Visit: Payer: Self-pay | Admitting: Hematology and Oncology

## 2023-05-23 NOTE — Telephone Encounter (Signed)
Attempted to speak with Patient regarding request for accommodations for work and update accommodations on FMLA form. Unable to reach Patient. Left voicemail for Patient to check with Employer regarding need for an accommodation form or if the current form could be updated and initialed with the changes. Provided this Nurse's telephone number for Patient to return phone call regarding form after speaking with Employer.

## 2023-06-06 MED FILL — Fosaprepitant Dimeglumine For IV Infusion 150 MG (Base Eq): INTRAVENOUS | Qty: 5 | Status: AC

## 2023-06-07 ENCOUNTER — Inpatient Hospital Stay: Payer: Commercial Managed Care - PPO

## 2023-06-07 ENCOUNTER — Inpatient Hospital Stay: Payer: Commercial Managed Care - PPO | Admitting: Hematology and Oncology

## 2023-06-07 VITALS — BP 116/82 | HR 82 | Temp 98.4°F | Resp 16

## 2023-06-07 VITALS — BP 134/72 | HR 96 | Temp 97.3°F | Resp 18 | Ht 64.02 in | Wt 224.7 lb

## 2023-06-07 DIAGNOSIS — Z170 Estrogen receptor positive status [ER+]: Secondary | ICD-10-CM

## 2023-06-07 DIAGNOSIS — Z95828 Presence of other vascular implants and grafts: Secondary | ICD-10-CM

## 2023-06-07 DIAGNOSIS — C50112 Malignant neoplasm of central portion of left female breast: Secondary | ICD-10-CM | POA: Diagnosis not present

## 2023-06-07 DIAGNOSIS — C50919 Malignant neoplasm of unspecified site of unspecified female breast: Secondary | ICD-10-CM

## 2023-06-07 DIAGNOSIS — C7951 Secondary malignant neoplasm of bone: Secondary | ICD-10-CM | POA: Diagnosis not present

## 2023-06-07 LAB — CBC WITH DIFFERENTIAL (CANCER CENTER ONLY)
Abs Immature Granulocytes: 0 10*3/uL (ref 0.00–0.07)
Basophils Absolute: 0.1 10*3/uL (ref 0.0–0.1)
Basophils Relative: 1 %
Eosinophils Absolute: 0.2 10*3/uL (ref 0.0–0.5)
Eosinophils Relative: 5 %
HCT: 36.3 % (ref 36.0–46.0)
Hemoglobin: 12.4 g/dL (ref 12.0–15.0)
Immature Granulocytes: 0 %
Lymphocytes Relative: 34 %
Lymphs Abs: 1.3 10*3/uL (ref 0.7–4.0)
MCH: 32 pg (ref 26.0–34.0)
MCHC: 34.2 g/dL (ref 30.0–36.0)
MCV: 93.6 fL (ref 80.0–100.0)
Monocytes Absolute: 0.5 10*3/uL (ref 0.1–1.0)
Monocytes Relative: 13 %
Neutro Abs: 1.8 10*3/uL (ref 1.7–7.7)
Neutrophils Relative %: 47 %
Platelet Count: 261 10*3/uL (ref 150–400)
RBC: 3.88 MIL/uL (ref 3.87–5.11)
RDW: 13.9 % (ref 11.5–15.5)
WBC Count: 3.7 10*3/uL — ABNORMAL LOW (ref 4.0–10.5)
nRBC: 0 % (ref 0.0–0.2)

## 2023-06-07 LAB — CMP (CANCER CENTER ONLY)
ALT: 37 U/L (ref 0–44)
AST: 28 U/L (ref 15–41)
Albumin: 4.1 g/dL (ref 3.5–5.0)
Alkaline Phosphatase: 78 U/L (ref 38–126)
Anion gap: 7 (ref 5–15)
BUN: 8 mg/dL (ref 6–20)
CO2: 28 mmol/L (ref 22–32)
Calcium: 9.3 mg/dL (ref 8.9–10.3)
Chloride: 105 mmol/L (ref 98–111)
Creatinine: 0.63 mg/dL (ref 0.44–1.00)
GFR, Estimated: >60 mL/min (ref >60)
Glucose, Bld: 85 mg/dL (ref 70–99)
Potassium: 3.9 mmol/L (ref 3.5–5.1)
Sodium: 140 mmol/L (ref 135–145)
Total Bilirubin: 0.5 mg/dL (ref 0.3–1.2)
Total Protein: 7 g/dL (ref 6.5–8.1)

## 2023-06-07 MED ORDER — DEXAMETHASONE SODIUM PHOSPHATE 10 MG/ML IJ SOLN
10.0000 mg | Freq: Once | INTRAMUSCULAR | Status: AC
  Administered 2023-06-07: 10 mg via INTRAVENOUS
  Filled 2023-06-07: qty 1

## 2023-06-07 MED ORDER — PALONOSETRON HCL INJECTION 0.25 MG/5ML
0.2500 mg | Freq: Once | INTRAVENOUS | Status: AC
  Administered 2023-06-07: 0.25 mg via INTRAVENOUS
  Filled 2023-06-07: qty 5

## 2023-06-07 MED ORDER — HEPARIN SOD (PORK) LOCK FLUSH 100 UNIT/ML IV SOLN
500.0000 [IU] | Freq: Once | INTRAVENOUS | Status: AC | PRN
Start: 2023-06-07 — End: 2023-06-07
  Administered 2023-06-07: 500 [IU]

## 2023-06-07 MED ORDER — SODIUM CHLORIDE 0.9 % IV SOLN
150.0000 mg | Freq: Once | INTRAVENOUS | Status: AC
  Administered 2023-06-07: 150 mg via INTRAVENOUS
  Filled 2023-06-07: qty 150

## 2023-06-07 MED ORDER — DIPHENHYDRAMINE HCL 25 MG PO CAPS
25.0000 mg | ORAL_CAPSULE | Freq: Once | ORAL | Status: AC
  Administered 2023-06-07: 25 mg via ORAL
  Filled 2023-06-07: qty 1

## 2023-06-07 MED ORDER — ACETAMINOPHEN 325 MG PO TABS
650.0000 mg | ORAL_TABLET | Freq: Once | ORAL | Status: AC
  Administered 2023-06-07: 650 mg via ORAL
  Filled 2023-06-07: qty 2

## 2023-06-07 MED ORDER — DEXTROSE 5 % IV SOLN
Freq: Once | INTRAVENOUS | Status: AC
Start: 2023-06-07 — End: 2023-06-07

## 2023-06-07 MED ORDER — SODIUM CHLORIDE 0.9% FLUSH
10.0000 mL | INTRAVENOUS | Status: DC | PRN
  Administered 2023-06-07: 10 mL

## 2023-06-07 MED ORDER — SODIUM CHLORIDE 0.9% FLUSH
10.0000 mL | Freq: Once | INTRAVENOUS | Status: AC
  Administered 2023-06-07: 10 mL

## 2023-06-07 MED ORDER — FAM-TRASTUZUMAB DERUXTECAN-NXKI CHEMO 100 MG IV SOLR
3.2000 mg/kg | Freq: Once | INTRAVENOUS | Status: AC
  Administered 2023-06-07: 300 mg via INTRAVENOUS
  Filled 2023-06-07: qty 15

## 2023-06-07 NOTE — Patient Instructions (Signed)
[place] [facility]  Discharge Instructions: Thank you for choosing [facility] to provide your oncology and hematology care.   If you have a lab appointment with the Cancer Center, please go directly to the Cancer Center and check in at the registration area.   Wear comfortable clothing and clothing appropriate for easy access to any Portacath or PICC line.   We strive to give you quality time with your provider. You may need to reschedule your appointment if you arrive late (15 or more minutes).  Arriving late affects you and other patients whose appointments are after yours.  Also, if you miss three or more appointments without notifying the office, you may be dismissed from the clinic at the provider's discretion.      For prescription refill requests, have your pharmacy contact our office and allow 72 hours for refills to be completed.    Today you received the following chemotherapy and/or immunotherapy agent: Fam-Trastuzumab (Enhertu)   To help prevent nausea and vomiting after your treatment, we encourage you to take your nausea medication as directed.  BELOW ARE SYMPTOMS THAT SHOULD BE REPORTED IMMEDIATELY: *FEVER GREATER THAN 100.4 F (38 C) OR HIGHER *CHILLS OR SWEATING *NAUSEA AND VOMITING THAT IS NOT CONTROLLED WITH YOUR NAUSEA MEDICATION *UNUSUAL SHORTNESS OF BREATH *UNUSUAL BRUISING OR BLEEDING *URINARY PROBLEMS (pain or burning when urinating, or frequent urination) *BOWEL PROBLEMS (unusual diarrhea, constipation, pain near the anus) TENDERNESS IN MOUTH AND THROAT WITH OR WITHOUT PRESENCE OF ULCERS (sore throat, sores in mouth, or a toothache) UNUSUAL RASH, SWELLING OR PAIN  UNUSUAL VAGINAL DISCHARGE OR ITCHING   Items with * indicate a potential emergency and should be followed up as soon as possible or go to the Emergency Department if any problems should occur.  Please show the CHEMOTHERAPY ALERT CARD or IMMUNOTHERAPY ALERT  CARD at check-in to the Emergency Department and triage nurse.  Should you have questions after your visit or need to cancel or reschedule your appointment, please contact [facility] [place] [facility]  Dept: [phone]  and follow the prompts.  Office hours are 8:00 a.m. to 4:30 p.m. Monday - Friday. Please note that voicemails left after 4:00 p.m. may not be returned until the following business day.  We are closed weekends and major holidays. You have access to a nurse at all times for urgent questions. Please call the main number to the clinic Dept: [phone] and follow the prompts.   For any non-urgent questions, you may also contact your provider using MyChart. We now offer e-Visits for anyone 86 and older to request care online for non-urgent symptoms. For details visit [email or web address].   Also download the MyChart app! Go to the app store, search "MyChart", open the app, select [facility], and log in with your MyChart username and password. Fam-Trastuzumab Deruxtecan Injection What is this medication? FAM-TRASTUZUMAB DERUXTECAN (fam-tras TOOZ eu mab DER ux TEE kan) treats some types of cancer. It works by blocking a protein that causes cancer cells to grow and multiply. This helps to slow or stop the spread of cancer cells. This medicine may be used for other purposes; ask your health care provider or pharmacist if you have questions. COMMON BRAND NAME(S): ENHERTU What should I tell my care team before I take this medication? They need to know if you have any of these conditions: Heart disease Heart failure Infection, especially a viral infection, such as chickenpox, cold sores, or herpes  Liver disease Lung or breathing disease, such as asthma or COPD An unusual or allergic reaction to fam-trastuzumab deruxtecan, other medications, foods, dyes, or preservatives Pregnant or trying to get pregnant Breast-feeding How should I use this medication? This  medication is injected into a vein. It is given by your care team in a hospital or clinic setting. A special MedGuide will be given to you before each treatment. Be sure to read this information carefully each time. Talk to your care team about the use of this medication in children. Special care may be needed. Overdosage: If you think you have taken too much of this medicine contact a poison control center or emergency room at once. NOTE: This medicine is only for you. Do not share this medicine with others. What if I miss a dose? It is important not to miss your dose. Call your care team if you are unable to keep an appointment. What may interact with this medication? Interactions are not expected. This list may not describe all possible interactions. Give your health care provider a list of all the medicines, herbs, non-prescription drugs, or dietary supplements you use. Also tell them if you smoke, drink alcohol, or use illegal drugs. Some items may interact with your medicine. What should I watch for while using this medication? Visit your care team for regular checks on your progress. Tell your care team if your symptoms do not start to get better or if they get worse. Your condition will be monitored carefully while you are receiving this medication. Do not become pregnant while taking this medication or for 7 months after stopping it. Women should inform their care team if they wish to become pregnant or think they might be pregnant. Men should not father a child while taking this medication and for 4 months after stopping it. There is potential for serious side effects to an unborn child. Talk to your care team for more information. Do not breast-feed an infant while taking this medication or for 7 months after the last dose. This medication has caused decreased sperm counts in some men. This may make it more difficult to father a child. Talk to your care team if you are concerned about your  fertility. This medication may increase your risk to bruise or bleed. Call your care team if you notice any unusual bleeding. Be careful brushing or flossing your teeth or using a toothpick because you may get an infection or bleed more easily. If you have any dental work done, tell your dentist you are receiving this medication. This medication may cause dry eyes and blurred vision. If you wear contact lenses, you may feel some discomfort. Lubricating eye drops may help. See your care team if the problem does not go away or is severe. This medication may increase your risk of getting an infection. Call your care team for advice if you get a fever, chills, sore throat, or other symptoms of a cold or flu. Do not treat yourself. Try to avoid being around people who are sick. Avoid taking medications that contain aspirin, acetaminophen, ibuprofen, naproxen, or ketoprofen unless instructed by your care team. These medications may hide a fever. What side effects may I notice from receiving this medication? Side effects that you should report to your care team as soon as possible: Allergic reactions--skin rash, itching, hives, swelling of the face, lips, tongue, or throat Dry cough, shortness of breath or trouble breathing Infection--fever, chills, cough, sore throat, wounds that don't heal, pain  or trouble when passing urine, general feeling of discomfort or being unwell Heart failure--shortness of breath, swelling of the ankles, feet, or hands, sudden weight gain, unusual weakness or fatigue Unusual bruising or bleeding Side effects that usually do not require medical attention (report these to your care team if they continue or are bothersome): Constipation Diarrhea Hair loss Muscle pain Nausea Vomiting This list may not describe all possible side effects. Call your doctor for medical advice about side effects. You may report side effects to FDA at 1-800-FDA-1088. Where should I keep my  medication? This medication is given in a hospital or clinic. It will not be stored at home. NOTE: This sheet is a summary. It may not cover all possible information. If you have questions about this medicine, talk to your doctor, pharmacist, or health care provider.  2024 Elsevier/Gold Standard (2021-05-10 00:00:00)

## 2023-06-07 NOTE — Assessment & Plan Note (Signed)
08/22/2019: PET CT scan: Metastatic disease in the chest with multiple lung nodules all subcentimeter size and hypermetabolic lymph nodes (right paratracheal node 7 mm, subcarinal node 1 cm, right hilar node 6 mm), retroperitoneal and upper pelvic common iliac lymph nodes, bone metastases especially L5, L4, T6, right 10th rib, left proximal femur   Bone biopsy was not felt to be reliable in addition to the effect of radiation on the bone. Palliative radiation completed 09/03/2019 [facility] 360: BRCA1 mutation, TMB 5.36, MSI: Normal   Prior treatment: Ibrance with letrozole and Xgeva for bone metastases switched to abemaciclib and Fulvestrant   Bone metastasis: Patient does not want to take bisphosphonates CT CAP 08/23/2022: New and enlarged pulmonary nodule consistent with progression.  Enlarged small low cervical and mediastinal nodes.  Multiple bone metastases minimally progressive.  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  Skull metastases, abnormal enhancement left globe  ---------------------------------------------------------------- Current treatment:: Enhertu cycle 11 Enhertu toxicities: Fatigue ANC 1.6 today: Okay to treat   CT CAP 03/12/2023: Improvement in pulmonary metastases.  No thoracic lymphadenopathy.  Similar hepatic lesions Brain MRI 04/16/2023: Unchanged leptomeningeal enhancement.   Anxiety Severe anxiety related to housing instability and interpersonal conflict. Old prescription of Alprazolam (Xanax) from 2021 still in use. -Write new prescription for Alprazolam. -Refer to Child psychotherapist for assistance with housing  search.   Return to clinic in 3 weeks for cycle 12

## 2023-06-08 ENCOUNTER — Encounter: Payer: Self-pay | Admitting: Hematology and Oncology

## 2023-06-08 NOTE — Progress Notes (Signed)
Patient Care Team: Patient, [name] Serena Croissant, MD as Medical Oncologist (Hematology and Oncology) [name], MD as Consulting Physician (Radiation Oncology) [name], NP as Nurse Practitioner (Hematology and Oncology) [name], MD as Consulting Physician (General Surgery) [name], RPH-CPP as Pharmacist (Hematology and Oncology)  DIAGNOSIS:  Encounter Diagnoses  Name Primary?   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  BRCA1 [id number] VUS identified as well.  The report date is 04/02/2014.  UPDATE: BRCA1 [id number] VUS has been reclassified as a likely benign variant.  The reclassification date is 04/21/2020.   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 N0 M0 stage IA: Oncotype DX 13 (ROR 9%)   04/16/2014 Surgery   Left upper back melanoma resected no residual melanoma identified on re-resection margins negative   05/26/2014 - 06/08/2015 Anti-estrogen oral therapy   Tamoxifen 20 mg daily with a plan to switch her to aromatase inhibitors once she gets oophorectomy ( patient did not continue antiestrogen therapy by choice)   08/13/2014 Surgery   oophorectomy with total abdominal hysterectomy   09/08/2015 -  Anti-estrogen oral therapy   Resumed anastrozole after she found recurrence of breast cancer; stopped in January 2018, started letrozole 2.5 mg daily 10/12/2016; switched to  Exemestane 25 mg daily on 01/03/17   09/16/2015 Surgery   Skin, left: IDC grade 3; 0.5 cm, margins negative, ER 95%, PR 10%, HER-2 positive ratio 2.08   11/08/2015 - 02/2017 Chemotherapy   Herceptin every 3 weeks plus anastrozole   11/24/2015 - 01/14/2016 Radiation Therapy   Adjuvant radiation therapy by Dr. Roselind Messier   01/2017 -  Anti-estrogen oral therapy   Exemestane daily (stopped others due to side effects), due to muscle aches and pains switch to tamoxifen 5 mg daily starting 12/18/2017   08/07/2019 Relapse/Recurrence   Bone scan: Sclerotic metastatic lesions involving left femoral neck, right ninth rib and entire L5 vertebra; MRI left hip: Diffuse signal abnormality L5 vertebral body with possible extraosseous extension   08/22/2019 - 09/03/2019 Radiation Therapy   Palliative radiation with Dr. Roselind Messier to L5 and left proixmal femur.    10/2021 Treatment Plan Change   Fulvestrant and Verzenio   08/23/2022 Imaging   CT CAP 08/23/2022: New and enlarged pulmonary nodule consistent with progression.  Enlarged small low cervical and mediastinal nodes.  Multiple bone metastases minimally progressive.  Liver lesions evaluation is challenging because of hepatic steatosis with suspected liver progression, isolated tiny omental nodule     09/07/2022 PET scan   IMPRESSION: 1. Worsening of disease now with nodal, pulmonary and hepatic metastases. 2. Areas of variable FDG uptake with some new bone lesions and some areas of bony metastasis which are more focal, for instance in the pelvis where there was likely diffuse involvement on prior imaging. Many of the areas of sclerosis do not show substantial uptake as outlined above and some areas show improvement/resolution of FDG uptake seen previously but there are signs of active  bony metastatic disease currently. 3. Likely LEFT adrenal adenoma. 4. Severe hepatic steatosis. 5. Aortic atherosclerosis.   Primary malignant neoplasm of breast with metastasis  (HCC)  08/25/2019 Initial Diagnosis   Primary malignant neoplasm of breast with metastasis (HCC)   10/11/2022 -  Chemotherapy   Patient is on Treatment Plan : BREAST METASTATIC Fam-Trastuzumab Deruxtecan-nxki (Enhertu) (5.4) q21d       CHIEF COMPLIANT: Enhertu cycle 11  HISTORY OF PRESENT ILLNESS:   History of Present Illness   The patient, with a history of cancer, presents for a follow-up after her most recent chemotherapy session. She reports no nausea, attributing this to the three different medications she is given. However, she does experience constipation as a side effect, which she manages with prunes and other remedies. She also reports fatigue, which she understands to be a normal side effect of the treatment. Despite this, she has been able to continue working.         ALLERGIES:  is allergic to dilaudid [hydromorphone] and codeine.  MEDICATIONS:  Current Outpatient Medications  Medication Sig Dispense Refill   ALPRAZolam (XANAX) 0.25 MG tablet Take 1 tablet (0.25 mg total) by mouth at bedtime as needed for anxiety. 30 tablet 3   aspirin EC 81 MG tablet Take 1 tablet (81 mg total) by mouth daily. Swallow whole. 90 tablet 3   aspirin-acetaminophen-caffeine (EXCEDRIN MIGRAINE) 250-250-65 MG tablet Take 2 tablets by mouth every 6 (six) hours as needed for headache.     CALCIUM PO Take 1,200 mg by mouth 2 (two) times daily.     dexamethasone (DECADRON) 4 MG tablet Take 1 tablets (4 mg) by mouth daily for 3 days after chemotherapy. Take with food. 30 tablet 1   dicyclomine (BENTYL) 20 MG tablet Take 1 tablet (20 mg total) by mouth every 6 (six) hours as needed for up to 7 days for spasms. 28 tablet 0   esomeprazole (NEXIUM) 40 MG capsule Take 40 mg by mouth daily.     lidocaine-prilocaine (EMLA) cream Apply to affected area once 30 g 3   loperamide (IMODIUM) 2 MG capsule Take 2 mg by mouth daily as needed for diarrhea or loose stools. Take 2 tablets at first diarrhea and then 1  tablet after additional loose stool. Max number in 24 hours is 8 tablets.     loratadine (CLARITIN) 10 MG tablet Take 10 mg by mouth daily.     meloxicam (MOBIC) 15 MG tablet TAKE 1 TABLET(15 MG) BY MOUTH DAILY AS NEEDED FOR PAIN 30 tablet 1   ondansetron (ZOFRAN) 8 MG tablet Take 1 tablet (8 mg total) by mouth every 8 (eight) hours as needed for nausea or vomiting. Start on the third day after chemotherapy. 30 tablet 1   prochlorperazine (COMPAZINE) 10 MG tablet Take 1 tablet (10 mg total) by mouth every 6 (six) hours as needed for nausea or vomiting. 30 tablet 1   promethazine (PHENERGAN) 25 MG suppository Place 1 suppository (25 mg total) rectally every 6 (six) hours as needed for nausea or vomiting. 12 each 0   rosuvastatin (CRESTOR) 10 MG tablet Take 1 tablet (10 mg total) by mouth daily. 90 tablet 3   traMADol (ULTRAM) 50 MG tablet Take 1 tablet (50 mg total) by mouth every 6 (six) hours as needed. 10 tablet 0   traZODone (DESYREL) 50 MG tablet TAKE 1 TABLET(50 MG) BY MOUTH AT BEDTIME 90 tablet 0   venlafaxine XR (EFFEXOR-XR) 75 MG 24 hr capsule TAKE 1 CAPSULE(75  MG) BY MOUTH AT BEDTIME 90 capsule 3   No current facility-administered medications for this visit.    PHYSICAL EXAMINATION: ECOG PERFORMANCE STATUS: 1 - Symptomatic but completely ambulatory  Vitals:   06/07/23 1423  BP: 134/72  Pulse: 96  Resp: 18  Temp: (!) 97.3 F (36.3 C)  SpO2: 100%   Filed Weights   06/07/23 1423  Weight: 224 lb 11.2 oz (101.9 kg)     LABORATORY DATA:  I have reviewed the data as listed    Latest Ref Rng & Units 06/07/2023    2:04 PM 05/17/2023    2:16 PM 04/26/2023    1:56 PM  CMP  Glucose 70 - 99 mg/dL 85  82  657   BUN 6 - 20 mg/dL 8  14  12    Creatinine 0.44 - 1.00 mg/dL 8.46  9.62  9.52   Sodium 135 - 145 mmol/L 140  141  140   Potassium 3.5 - 5.1 mmol/L 3.9  4.2  4.0   Chloride 98 - 111 mmol/L 105  106  105   CO2 22 - 32 mmol/L 28  29  29    Calcium 8.9 - 10.3 mg/dL 9.3  9.3   8.9   Total Protein 6.5 - 8.1 g/dL 7.0  6.6  6.8   Total Bilirubin 0.3 - 1.2 mg/dL 0.5  0.3  0.4   Alkaline Phos 38 - 126 U/L 78  79  81   AST 15 - 41 U/L 28  25  34   ALT 0 - 44 U/L 37  36  41     Lab Results  Component Value Date   WBC 3.7 (L) 06/07/2023   HGB 12.4 06/07/2023   HCT 36.3 06/07/2023   MCV 93.6 06/07/2023   PLT 261 06/07/2023   NEUTROABS 1.8 06/07/2023    ASSESSMENT & PLAN:  Cancer of central portion of left female breast (HCC) 08/22/2019: PET CT scan: Metastatic disease in the chest with multiple lung nodules all subcentimeter size and hypermetabolic lymph nodes (right paratracheal node 7 mm, subcarinal node 1 cm, right hilar node 6 mm), retroperitoneal and upper pelvic common iliac lymph nodes, bone metastases especially L5, L4, T6, right 10th rib, left proximal femur   Bone biopsy was not felt to be reliable in addition to the effect of radiation on the bone. Palliative radiation completed 09/03/2019 [facility]: BRCA1 mutation, TMB 5.36, MSI: Normal   Prior treatment: Ibrance with letrozole and Xgeva for bone metastases switched to abemaciclib and Fulvestrant   Bone metastasis: Patient does not want to take bisphosphonates CT CAP 08/23/2022: New and enlarged pulmonary nodule consistent with progression.  Enlarged small low cervical and mediastinal nodes.  Multiple bone metastases minimally progressive.  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  Skull metastases, abnormal enhancement left globe  ---------------------------------------------------------------- Current treatment:: Enhertu cycle 11 Enhertu  toxicities: Fatigue ANC 1.6 today: Okay to treat   CT CAP 03/12/2023: Improvement in pulmonary metastases.  No thoracic lymphadenopathy.  Similar hepatic lesions Brain MRI 04/16/2023: Unchanged leptomeningeal enhancement.   Anxiety Severe anxiety related to housing instability and interpersonal conflict. Old prescription of Alprazolam (Xanax) from 2021 still in use. -Write new prescription  for Alprazolam. -Refer to Child psychotherapist for assistance with housing search.   Return to clinic in 3 weeks for cycle 12     No orders of the defined types were placed in this encounter.  The patient has a good understanding of the overall plan. she agrees with it. she will call with any problems that may develop before the next visit here. Total time spent: 30 mins including face to face time and time spent for planning, charting and co-ordination of care   Tamsen Meek, MD 06/08/23

## 2023-06-19 ENCOUNTER — Other Ambulatory Visit: Payer: Self-pay

## 2023-06-20 ENCOUNTER — Telehealth: Payer: Self-pay | Admitting: Hematology and Oncology

## 2023-06-20 NOTE — Telephone Encounter (Signed)
I discussed with the patient about her recent lab abnormalities detected at [facility] emergency room. Patient was frantically calling concerned about the presence of EBV antibodies in her serum. EBV IgG: Positive EBV IgM: Negative  I discussed with the patient that this indicates a past infection with EBV and does not indicate any active infection at this time. She is not contagious and can return to work.

## 2023-06-22 ENCOUNTER — Telehealth: Payer: Self-pay | Admitting: *Deleted

## 2023-06-22 ENCOUNTER — Other Ambulatory Visit: Payer: Self-pay

## 2023-06-22 MED ORDER — AMOXICILLIN 500 MG PO TABS: 500.0000 mg | ORAL_TABLET | Freq: Two times a day (BID) | ORAL | 0 refills | Status: DC

## 2023-06-22 NOTE — Telephone Encounter (Signed)
Received call from pt with complaint of head cold that has now caused ear pain and congestion.  Verbal orders received from MD for pt to be prescribed Amoxicillin 500 mg p.o BID x7 days.  Prescription sent to pharmacy on file, pt educated and verbalized understanding.

## 2023-06-25 ENCOUNTER — Telehealth: Payer: Self-pay

## 2023-06-25 MED ORDER — LEVOFLOXACIN 750 MG PO TABS: 750.0000 mg | ORAL_TABLET | Freq: Every day | ORAL | 0 refills | Status: DC

## 2023-06-25 NOTE — Telephone Encounter (Signed)
Pt called and states amoxicillin is not helping and she is actually feeling worse and is experiencing pain her her ears. She is asking for a shot of Rocephin because a friend told her this was most effective. Per MD, pt should stop amoxicillin and is sending in levofloxacin 750 mg 1 tab daily X 7 days. Pt was educated to stop amoxicillin and details about levofloxacin. She verbalized understanding and knows to call with any further concerns.

## 2023-06-26 ENCOUNTER — Other Ambulatory Visit: Payer: Self-pay

## 2023-06-26 ENCOUNTER — Telehealth: Payer: Self-pay

## 2023-06-26 NOTE — Telephone Encounter (Signed)
Pt called and reports last night she experienced spike in temp, unable to report numeric value. She states she woke up in a sweat, stating her fever broke, but that she is still experiencing ear pain. She also reports since starting Levaquin as ordered yesterday, she had "crazy ass dreams" and states she "threw up so much."   When asked, pt denies taking Amoxicillin yesterday in conjunction with Levaquin. She reports she did not take Zofran before taking Levaquin and did not eat much before taking. Educated pt on use of antiemetics and importance of eating with this abx.  D/t the nature of her sx, MD requests pt go to urgent care to be retested for flu and COVID. Pt states she will call us back with results.

## 2023-06-26 NOTE — Telephone Encounter (Signed)
Pt called and states she went to urgent care but was not positive for flu or COVID. They ordered steroid and doxycycline. Per MD, pt should not take doxycycline, but Levaquin in conjunction with steroid. If pt experiences n/v with zofran and steroid while taking Levaquin, she can stop it and start doxycycline. She will call with any further concerns.

## 2023-06-27 MED FILL — Fosaprepitant Dimeglumine For IV Infusion 150 MG (Base Eq): INTRAVENOUS | Qty: 5 | Status: AC

## 2023-06-28 ENCOUNTER — Inpatient Hospital Stay: Payer: Commercial Managed Care - PPO

## 2023-06-28 ENCOUNTER — Inpatient Hospital Stay: Payer: Commercial Managed Care - PPO | Admitting: Hematology and Oncology

## 2023-06-28 ENCOUNTER — Inpatient Hospital Stay: Payer: Commercial Managed Care - PPO | Attending: Hematology and Oncology

## 2023-06-28 VITALS — BP 127/76 | HR 92 | Temp 98.2°F | Resp 18 | Ht 64.02 in | Wt 219.9 lb

## 2023-06-28 DIAGNOSIS — Z85820 Personal history of malignant melanoma of skin: Secondary | ICD-10-CM | POA: Insufficient documentation

## 2023-06-28 DIAGNOSIS — C50919 Malignant neoplasm of unspecified site of unspecified female breast: Secondary | ICD-10-CM

## 2023-06-28 DIAGNOSIS — K760 Fatty (change of) liver, not elsewhere classified: Secondary | ICD-10-CM | POA: Diagnosis not present

## 2023-06-28 DIAGNOSIS — Z7952 Long term (current) use of systemic steroids: Secondary | ICD-10-CM | POA: Diagnosis not present

## 2023-06-28 DIAGNOSIS — Z9221 Personal history of antineoplastic chemotherapy: Secondary | ICD-10-CM | POA: Diagnosis not present

## 2023-06-28 DIAGNOSIS — I700 Atherosclerosis of aorta: Secondary | ICD-10-CM | POA: Insufficient documentation

## 2023-06-28 DIAGNOSIS — G4700 Insomnia, unspecified: Secondary | ICD-10-CM | POA: Insufficient documentation

## 2023-06-28 DIAGNOSIS — Z79811 Long term (current) use of aromatase inhibitors: Secondary | ICD-10-CM | POA: Diagnosis not present

## 2023-06-28 DIAGNOSIS — C787 Secondary malignant neoplasm of liver and intrahepatic bile duct: Secondary | ICD-10-CM | POA: Diagnosis not present

## 2023-06-28 DIAGNOSIS — C7951 Secondary malignant neoplasm of bone: Secondary | ICD-10-CM | POA: Diagnosis present

## 2023-06-28 DIAGNOSIS — Z7982 Long term (current) use of aspirin: Secondary | ICD-10-CM | POA: Diagnosis not present

## 2023-06-28 DIAGNOSIS — C50112 Malignant neoplasm of central portion of left female breast: Secondary | ICD-10-CM | POA: Diagnosis not present

## 2023-06-28 DIAGNOSIS — Z5112 Encounter for antineoplastic immunotherapy: Secondary | ICD-10-CM | POA: Insufficient documentation

## 2023-06-28 DIAGNOSIS — Z1501 Genetic susceptibility to malignant neoplasm of breast: Secondary | ICD-10-CM | POA: Diagnosis not present

## 2023-06-28 DIAGNOSIS — R110 Nausea: Secondary | ICD-10-CM | POA: Insufficient documentation

## 2023-06-28 DIAGNOSIS — Z170 Estrogen receptor positive status [ER+]: Secondary | ICD-10-CM | POA: Insufficient documentation

## 2023-06-28 DIAGNOSIS — Z923 Personal history of irradiation: Secondary | ICD-10-CM | POA: Diagnosis not present

## 2023-06-28 DIAGNOSIS — Z79899 Other long term (current) drug therapy: Secondary | ICD-10-CM | POA: Diagnosis not present

## 2023-06-28 LAB — CMP (CANCER CENTER ONLY)
ALT: 18 U/L (ref 0–44)
AST: 16 U/L (ref 15–41)
Albumin: 3.7 g/dL (ref 3.5–5.0)
Alkaline Phosphatase: 82 U/L (ref 38–126)
Anion gap: 6 (ref 5–15)
BUN: 11 mg/dL (ref 6–20)
CO2: 32 mmol/L (ref 22–32)
Calcium: 9.2 mg/dL (ref 8.9–10.3)
Chloride: 105 mmol/L (ref 98–111)
Creatinine: 0.72 mg/dL (ref 0.44–1.00)
GFR, Estimated: >60 mL/min (ref >60)
Glucose, Bld: 132 mg/dL — ABNORMAL HIGH (ref 70–99)
Potassium: 3.8 mmol/L (ref 3.5–5.1)
Sodium: 143 mmol/L (ref 135–145)
Total Bilirubin: 0.2 mg/dL (ref <1.2)
Total Protein: 7.2 g/dL (ref 6.5–8.1)

## 2023-06-28 LAB — CBC WITH DIFFERENTIAL (CANCER CENTER ONLY)
Abs Immature Granulocytes: 0.07 10*3/uL (ref 0.00–0.07)
Basophils Absolute: 0 10*3/uL (ref 0.0–0.1)
Basophils Relative: 1 %
Eosinophils Absolute: 0.1 10*3/uL (ref 0.0–0.5)
Eosinophils Relative: 2 %
HCT: 36.6 % (ref 36.0–46.0)
Hemoglobin: 12 g/dL (ref 12.0–15.0)
Immature Granulocytes: 1 %
Lymphocytes Relative: 20 %
Lymphs Abs: 1.3 10*3/uL (ref 0.7–4.0)
MCH: 31.1 pg (ref 26.0–34.0)
MCHC: 32.8 g/dL (ref 30.0–36.0)
MCV: 94.8 fL (ref 80.0–100.0)
Monocytes Absolute: 0.5 10*3/uL (ref 0.1–1.0)
Monocytes Relative: 8 %
Neutro Abs: 4.8 10*3/uL (ref 1.7–7.7)
Neutrophils Relative %: 68 %
Platelet Count: 310 10*3/uL (ref 150–400)
RBC: 3.86 MIL/uL — ABNORMAL LOW (ref 3.87–5.11)
RDW: 13.4 % (ref 11.5–15.5)
WBC Count: 6.9 10*3/uL (ref 4.0–10.5)
nRBC: 0 % (ref 0.0–0.2)

## 2023-06-28 MED ORDER — DEXTROSE 5 % IV SOLN: Freq: Once | INTRAVENOUS | Status: AC

## 2023-06-28 MED ORDER — PROMETHAZINE HCL 25 MG/ML IJ SOLN
25.0000 mg | Freq: Once | INTRAMUSCULAR | Status: AC
  Administered 2023-06-28: 25 mg via INTRAVENOUS
  Filled 2023-06-28: qty 1

## 2023-06-28 MED ORDER — TRAZODONE HCL 50 MG PO TABS: ORAL_TABLET | ORAL | 3 refills | Status: DC

## 2023-06-28 MED ORDER — HEPARIN SOD (PORK) LOCK FLUSH 100 UNIT/ML IV SOLN
500.0000 [IU] | Freq: Once | INTRAVENOUS | Status: AC | PRN
  Administered 2023-06-28: 500 [IU]

## 2023-06-28 MED ORDER — SODIUM CHLORIDE 0.9 % IV SOLN
150.0000 mg | Freq: Once | INTRAVENOUS | Status: AC
  Administered 2023-06-28: 150 mg via INTRAVENOUS
  Filled 2023-06-28: qty 150

## 2023-06-28 MED ORDER — ACETAMINOPHEN 325 MG PO TABS
650.0000 mg | ORAL_TABLET | Freq: Once | ORAL | Status: AC
  Administered 2023-06-28: 650 mg via ORAL
  Filled 2023-06-28: qty 2

## 2023-06-28 MED ORDER — FAM-TRASTUZUMAB DERUXTECAN-NXKI CHEMO 100 MG IV SOLR
3.2000 mg/kg | Freq: Once | INTRAVENOUS | Status: AC
  Administered 2023-06-28: 300 mg via INTRAVENOUS
  Filled 2023-06-28: qty 15

## 2023-06-28 MED ORDER — SODIUM CHLORIDE 0.9% FLUSH
10.0000 mL | INTRAVENOUS | Status: DC | PRN
  Administered 2023-06-28: 10 mL

## 2023-06-28 MED ORDER — DEXAMETHASONE SODIUM PHOSPHATE 10 MG/ML IJ SOLN
10.0000 mg | Freq: Once | INTRAMUSCULAR | Status: AC
  Administered 2023-06-28: 10 mg via INTRAVENOUS
  Filled 2023-06-28: qty 1

## 2023-06-28 MED ORDER — DIPHENHYDRAMINE HCL 25 MG PO CAPS
25.0000 mg | ORAL_CAPSULE | Freq: Once | ORAL | Status: AC
  Administered 2023-06-28: 25 mg via ORAL
  Filled 2023-06-28: qty 1

## 2023-06-28 NOTE — Progress Notes (Signed)
Patient Care Team: Patient, [name] Serena Croissant, MD as Medical Oncologist (Hematology and Oncology) [name], MD as Consulting Physician (Radiation Oncology) [name], NP as Nurse Practitioner (Hematology and Oncology) [name], MD as Consulting Physician (General Surgery) [name], RPH-CPP as Pharmacist (Hematology and Oncology)  DIAGNOSIS:  Encounter Diagnoses  Name Primary?   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  BRCA1 p.[id number] VUS identified as well.  The report date is 04/02/2014.  UPDATE: BRCA1 I.[id number] VUS has been reclassified as a likely benign variant.  The reclassification date is 04/21/2020.   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 N0 M0 stage IA: Oncotype DX 13 (ROR 9%)   04/16/2014 Surgery   Left upper back melanoma resected no residual melanoma identified on re-resection margins negative   05/26/2014 - 06/08/2015 Anti-estrogen oral therapy   Tamoxifen 20 mg daily with a plan to switch her to aromatase inhibitors once she gets oophorectomy ( patient did not continue antiestrogen therapy by choice)   08/13/2014 Surgery   oophorectomy with total abdominal hysterectomy   09/08/2015 -  Anti-estrogen oral therapy   Resumed anastrozole after she found recurrence of breast cancer; stopped in January 2018, started letrozole 2.5 mg daily 10/12/2016; switched to  Exemestane 25 mg daily on 01/03/17   09/16/2015 Surgery   Skin, left: IDC grade 3; 0.5 cm, margins negative, ER 95%, PR 10%, HER-2 positive ratio 2.08   11/08/2015 - 02/2017 Chemotherapy   Herceptin every 3 weeks plus anastrozole   11/24/2015 - 01/14/2016 Radiation Therapy   Adjuvant radiation therapy by Dr. Roselind Messier   01/2017 -  Anti-estrogen oral therapy   Exemestane daily (stopped others due to side effects), due to muscle aches and pains switch to tamoxifen 5 mg daily starting 12/18/2017   08/07/2019 Relapse/Recurrence   Bone scan: Sclerotic metastatic lesions involving left femoral neck, right ninth rib and entire L5 vertebra; MRI left hip: Diffuse signal abnormality L5 vertebral body with possible extraosseous extension   08/22/2019 - 09/03/2019 Radiation Therapy   Palliative radiation with Dr. Roselind Messier to L5 and left proixmal femur.    10/2021 Treatment Plan Change   Fulvestrant and Verzenio   08/23/2022 Imaging   CT CAP 08/23/2022: New and enlarged pulmonary nodule consistent with progression.  Enlarged small low cervical and mediastinal nodes.  Multiple bone metastases minimally progressive.  Liver lesions evaluation is challenging because of hepatic steatosis with suspected liver progression, isolated tiny omental nodule     09/07/2022 PET scan   IMPRESSION: 1. Worsening of disease now with nodal, pulmonary and hepatic metastases. 2. Areas of variable FDG uptake with some new bone lesions and some areas of bony metastasis which are more focal, for instance in the pelvis where there was likely diffuse involvement on prior imaging. Many of the areas of sclerosis do not show substantial uptake as outlined above and some areas show improvement/resolution of FDG uptake seen previously but there are signs of active  bony metastatic disease currently. 3. Likely LEFT adrenal adenoma. 4. Severe hepatic steatosis. 5. Aortic atherosclerosis.   Primary malignant neoplasm of breast with metastasis  (HCC)  08/25/2019 Initial Diagnosis   Primary malignant neoplasm of breast with metastasis (HCC)   10/11/2022 -  Chemotherapy   Patient is on Treatment Plan : BREAST METASTATIC Fam-Trastuzumab Deruxtecan-nxki (Enhertu) (5.4) q21d       CHIEF COMPLIANT: Cycle 12 Enhertu  HISTORY OF PRESENT ILLNESS:   History of Present Illness   The patient, with a history of Met breast cancer and severe ear infection, presents with ongoing symptoms despite treatment with levofloxacin. She reports some improvement, particularly in nausea since starting Zoloft and eating prior to taking the medication. She has three more days of antibiotics left. She also reports a history of fever, for which she was hospitalized and extensively tested. The cause of the fever remains unknown, but it was suggested it could be viral. She has not had a fever since the hospitalization.  The patient also reports a history of constipation, which she believes may have contributed to her fever. She has been managing this with dietary changes and Colace, but it remains a significant issue, causing bleeding. She is seeking alternatives to her current nausea medication, which she believes may be contributing to the constipation.  In addition to these issues, the patient also reports a recent burn from hot soup due to a broken recliner. She has been managing this with burn cream. She also reports unusual dreams, which she attributes to the antibiotics.         ALLERGIES:  is allergic to dilaudid [hydromorphone] and codeine.  MEDICATIONS:  Current Outpatient Medications  Medication Sig Dispense Refill   ALPRAZolam (XANAX) 0.25 MG tablet Take 1 tablet (0.25 mg total) by mouth at bedtime as needed for anxiety. 30 tablet 3   amoxicillin (AMOXIL) 500 MG tablet Take 1 tablet (500 mg total) by mouth 2 (two) times daily. 14 tablet 0   aspirin EC 81 MG tablet Take 1 tablet (81 mg total) by mouth daily. Swallow whole. 90 tablet 3    aspirin-acetaminophen-caffeine (EXCEDRIN MIGRAINE) 250-250-65 MG tablet Take 2 tablets by mouth every 6 (six) hours as needed for headache.     CALCIUM PO Take 1,200 mg by mouth 2 (two) times daily.     dexamethasone (DECADRON) 4 MG tablet Take 1 tablets (4 mg) by mouth daily for 3 days after chemotherapy. Take with food. 30 tablet 1   dicyclomine (BENTYL) 20 MG tablet Take 1 tablet (20 mg total) by mouth every 6 (six) hours as needed for up to 7 days for spasms. 28 tablet 0   esomeprazole (NEXIUM) 40 MG capsule Take 40 mg by mouth daily.     levofloxacin (LEVAQUIN) 750 MG tablet Take 1 tablet (750 mg total) by mouth daily. 7 tablet 0   lidocaine-prilocaine (EMLA) cream Apply to affected area once 30 g 3   loperamide (IMODIUM) 2 MG capsule Take 2 mg by mouth daily as needed for diarrhea or loose stools. Take 2 tablets at first diarrhea and then 1 tablet after additional loose stool. Max number in 24 hours is 8 tablets.     loratadine (CLARITIN) 10 MG tablet Take 10 mg by mouth daily.     meloxicam (MOBIC) 15 MG tablet TAKE 1 TABLET(15 MG) BY MOUTH DAILY AS NEEDED FOR PAIN 30 tablet 1   ondansetron (ZOFRAN) 8 MG tablet Take 1 tablet (8 mg total) by mouth every 8 (  eight) hours as needed for nausea or vomiting. Start on the third day after chemotherapy. 30 tablet 1   prochlorperazine (COMPAZINE) 10 MG tablet Take 1 tablet (10 mg total) by mouth every 6 (six) hours as needed for nausea or vomiting. 30 tablet 1   rosuvastatin (CRESTOR) 10 MG tablet Take 1 tablet (10 mg total) by mouth daily. 90 tablet 3   traMADol (ULTRAM) 50 MG tablet Take 1 tablet (50 mg total) by mouth every 6 (six) hours as needed. 10 tablet 0   traZODone (DESYREL) 50 MG tablet TAKE 1 TABLET(50 MG) BY MOUTH AT BEDTIME 90 tablet 3   venlafaxine XR (EFFEXOR-XR) 75 MG 24 hr capsule TAKE 1 CAPSULE(75 MG) BY MOUTH AT BEDTIME 90 capsule 3   No current facility-administered medications for this visit.    PHYSICAL EXAMINATION: ECOG  PERFORMANCE STATUS: 1 - Symptomatic but completely ambulatory  Vitals:   06/28/23 1436  BP: 127/76  Pulse: 92  Resp: 18  Temp: 98.2 F (36.8 C)  SpO2: 96%   Filed Weights   06/28/23 1436  Weight: 219 lb 14.4 oz (99.7 kg)      LABORATORY DATA:  I have reviewed the data as listed    Latest Ref Rng & Units 06/28/2023    2:08 PM 06/07/2023    2:04 PM 05/17/2023    2:16 PM  CMP  Glucose 70 - 99 mg/dL 295  85  82   BUN 6 - 20 mg/dL 11  8  14    Creatinine 0.44 - 1.00 mg/dL 6.21  3.08  6.57   Sodium 135 - 145 mmol/L 143  140  141   Potassium 3.5 - 5.1 mmol/L 3.8  3.9  4.2   Chloride 98 - 111 mmol/L 105  105  106   CO2 22 - 32 mmol/L 32  28  29   Calcium 8.9 - 10.3 mg/dL 9.2  9.3  9.3   Total Protein 6.5 - 8.1 g/dL 7.2  7.0  6.6   Total Bilirubin <1.2 mg/dL 0.2  0.5  0.3   Alkaline Phos 38 - 126 U/L 82  78  79   AST 15 - 41 U/L 16  28  25    ALT 0 - 44 U/L 18  37  36     Lab Results  Component Value Date   WBC 6.9 06/28/2023   HGB 12.0 06/28/2023   HCT 36.6 06/28/2023   MCV 94.8 06/28/2023   PLT 310 06/28/2023   NEUTROABS 4.8 06/28/2023    ASSESSMENT & PLAN:  Cancer of central portion of left female breast (HCC) 08/22/2019: PET CT scan: Metastatic disease in the chest with multiple lung nodules all subcentimeter size and hypermetabolic lymph nodes (right paratracheal node 7 mm, subcarinal node 1 cm, right hilar node 6 mm), retroperitoneal and upper pelvic common iliac lymph nodes, bone metastases especially L5, L4, T6, right 10th rib, left proximal femur   Bone biopsy was not felt to be reliable in addition to the effect of radiation on the bone. Palliative radiation completed 09/03/2019 [facility]: BRCA1 mutation, TMB 5.36, MSI: Normal   Prior treatment: Ibrance with letrozole and Xgeva for bone metastases switched to abemaciclib and Fulvestrant   Bone metastasis: Patient does not want to take bisphosphonates CT CAP 08/23/2022: New and enlarged pulmonary nodule  consistent with progression.  Enlarged small low cervical and mediastinal nodes.  Multiple bone metastases minimally progressive.  Liver lesions evaluation is challenging because of hepatic steatosis with suspected liver  progression, isolated tiny omental nodule   Ultrasound left supraclavicular biopsy 09/22/2022: Metastatic breast cancer ER 90%, PR 0%, Ki-67 60%, HER2 1+ Brain MRI: 10/03/2022: Small foci of enhancement in the right central sulcus and within an anterior right frontal sulcus concerning for leptomeningeal disease.  Skull metastases, abnormal enhancement left globe  ---------------------------------------------------------------- Current treatment:: Enhertu cycle 12 Enhertu toxicities: Fatigue ANC 1.6 today: Okay to treat   CT CAP 03/12/2023: Improvement in pulmonary metastases.  No thoracic lymphadenopathy.  Similar hepatic lesions Brain MRI 04/16/2023: Unchanged leptomeningeal enhancement.   Upper respiratory infection: No more fevers.  Currently finishing levofloxacin and prednisone.  She is starting to feel a lot better.  Chemotherapy-induced Nausea Improved with Zofran taken after meals. Discussed changing antiemetic during infusions due to constipation side effect. -Change antiemetic during infusions to Phenergan to potentially reduce constipation.  Insomnia Trazodone refill needed. -Refill Trazodone with three refills to [facility], [address].  General Health Maintenance -Encouraged to return to work as not contagious.     Return to clinic in 3 weeks for cycle 13     Orders Placed This Encounter  Procedures   CBC with Differential (Cancer Center Only)    Standing Status:   Future    Standing Expiration Date:   08/29/2024   CMP (Cancer Center only)    Standing Status:   Future    Standing Expiration Date:   08/29/2024   CBC with Differential (Cancer Center Only)    Standing Status:   Future    Standing Expiration Date:   09/19/2024   CMP (Cancer Center only)     Standing Status:   Future    Standing Expiration Date:   09/19/2024   CBC with Differential (Cancer Center Only)    Standing Status:   Future    Standing Expiration Date:   10/10/2024   CMP (Cancer Center only)    Standing Status:   Future    Standing Expiration Date:   10/10/2024   CBC with Differential (Cancer Center Only)    Standing Status:   Future    Standing Expiration Date:   10/31/2024   CMP (Cancer Center only)    Standing Status:   Future    Standing Expiration Date:   10/31/2024   CBC with Differential (Cancer Center Only)    Standing Status:   Future    Standing Expiration Date:   11/21/2024   CMP (Cancer Center only)    Standing Status:   Future    Standing Expiration Date:   11/21/2024   CBC with Differential (Cancer Center Only)    Standing Status:   Future    Standing Expiration Date:   12/12/2024   CMP (Cancer Center only)    Standing Status:   Future    Standing Expiration Date:   12/12/2024   The patient has a good understanding of the overall plan. she agrees with it. she will call with any problems that may develop before the next visit here. Total time spent: 30 mins including face to face time and time spent for planning, charting and co-ordination of care   Tamsen Meek, MD 06/28/23

## 2023-06-28 NOTE — Assessment & Plan Note (Addendum)
08/22/2019: PET CT scan: Metastatic disease in the chest with multiple lung nodules all subcentimeter size and hypermetabolic lymph nodes (right paratracheal node 7 mm, subcarinal node 1 cm, right hilar node 6 mm), retroperitoneal and upper pelvic common iliac lymph nodes, bone metastases especially L5, L4, T6, right 10th rib, left proximal femur   Bone biopsy was not felt to be reliable in addition to the effect of radiation on the bone. Palliative radiation completed 09/03/2019 [facility]: BRCA1 mutation, TMB 5.36, MSI: Normal   Prior treatment: Ibrance with letrozole and Xgeva for bone metastases switched to abemaciclib and Fulvestrant   Bone metastasis: Patient does not want to take bisphosphonates CT CAP 08/23/2022: New and enlarged pulmonary nodule consistent with progression.  Enlarged small low cervical and mediastinal nodes.  Multiple bone metastases minimally progressive.  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  Skull metastases, abnormal enhancement left globe  ---------------------------------------------------------------- Current treatment:: Enhertu cycle 12 Enhertu toxicities: Fatigue ANC 1.6 today: Okay to treat   CT CAP 03/12/2023: Improvement in pulmonary metastases.  No thoracic lymphadenopathy.  Similar hepatic lesions Brain MRI 04/16/2023: Unchanged leptomeningeal enhancement.   Upper respiratory infection: No more fevers.  Currently finishing levofloxacin and prednisone.  She is starting to feel a lot better.  Return to clinic in 3 weeks for cycle 13

## 2023-07-06 ENCOUNTER — Other Ambulatory Visit: Payer: Self-pay

## 2023-07-12 ENCOUNTER — Other Ambulatory Visit: Payer: Self-pay

## 2023-07-18 MED FILL — Fosaprepitant Dimeglumine For IV Infusion 150 MG (Base Eq): INTRAVENOUS | Qty: 5 | Status: AC

## 2023-07-19 ENCOUNTER — Inpatient Hospital Stay: Payer: Commercial Managed Care - PPO | Admitting: Adult Health

## 2023-07-19 ENCOUNTER — Other Ambulatory Visit: Payer: Self-pay | Admitting: *Deleted

## 2023-07-19 ENCOUNTER — Inpatient Hospital Stay: Payer: Commercial Managed Care - PPO

## 2023-07-19 ENCOUNTER — Inpatient Hospital Stay: Payer: Commercial Managed Care - PPO | Attending: Hematology and Oncology

## 2023-07-19 VITALS — BP 132/79 | HR 89 | Temp 98.2°F | Resp 16

## 2023-07-19 VITALS — BP 145/79 | HR 78 | Temp 98.0°F | Resp 18 | Wt 230.0 lb

## 2023-07-19 DIAGNOSIS — Z1501 Genetic susceptibility to malignant neoplasm of breast: Secondary | ICD-10-CM | POA: Insufficient documentation

## 2023-07-19 DIAGNOSIS — Z5112 Encounter for antineoplastic immunotherapy: Secondary | ICD-10-CM | POA: Insufficient documentation

## 2023-07-19 DIAGNOSIS — Z79899 Other long term (current) drug therapy: Secondary | ICD-10-CM | POA: Insufficient documentation

## 2023-07-19 DIAGNOSIS — Z7952 Long term (current) use of systemic steroids: Secondary | ICD-10-CM | POA: Insufficient documentation

## 2023-07-19 DIAGNOSIS — Z7982 Long term (current) use of aspirin: Secondary | ICD-10-CM | POA: Diagnosis not present

## 2023-07-19 DIAGNOSIS — Z79811 Long term (current) use of aromatase inhibitors: Secondary | ICD-10-CM | POA: Diagnosis not present

## 2023-07-19 DIAGNOSIS — Z85828 Personal history of other malignant neoplasm of skin: Secondary | ICD-10-CM | POA: Insufficient documentation

## 2023-07-19 DIAGNOSIS — K5900 Constipation, unspecified: Secondary | ICD-10-CM | POA: Diagnosis not present

## 2023-07-19 DIAGNOSIS — C50112 Malignant neoplasm of central portion of left female breast: Secondary | ICD-10-CM | POA: Insufficient documentation

## 2023-07-19 DIAGNOSIS — Z9221 Personal history of antineoplastic chemotherapy: Secondary | ICD-10-CM | POA: Insufficient documentation

## 2023-07-19 DIAGNOSIS — R918 Other nonspecific abnormal finding of lung field: Secondary | ICD-10-CM | POA: Insufficient documentation

## 2023-07-19 DIAGNOSIS — C7951 Secondary malignant neoplasm of bone: Secondary | ICD-10-CM | POA: Diagnosis not present

## 2023-07-19 DIAGNOSIS — Z170 Estrogen receptor positive status [ER+]: Secondary | ICD-10-CM | POA: Diagnosis not present

## 2023-07-19 DIAGNOSIS — R112 Nausea with vomiting, unspecified: Secondary | ICD-10-CM | POA: Insufficient documentation

## 2023-07-19 DIAGNOSIS — Z923 Personal history of irradiation: Secondary | ICD-10-CM | POA: Diagnosis not present

## 2023-07-19 DIAGNOSIS — C50919 Malignant neoplasm of unspecified site of unspecified female breast: Secondary | ICD-10-CM

## 2023-07-19 DIAGNOSIS — C787 Secondary malignant neoplasm of liver and intrahepatic bile duct: Secondary | ICD-10-CM | POA: Diagnosis not present

## 2023-07-19 DIAGNOSIS — Z95828 Presence of other vascular implants and grafts: Secondary | ICD-10-CM

## 2023-07-19 DIAGNOSIS — H9201 Otalgia, right ear: Secondary | ICD-10-CM

## 2023-07-19 LAB — CMP (CANCER CENTER ONLY)
ALT: 29 U/L (ref 0–44)
AST: 27 U/L (ref 15–41)
Albumin: 3.9 g/dL (ref 3.5–5.0)
Alkaline Phosphatase: 68 U/L (ref 38–126)
Anion gap: 5 (ref 5–15)
BUN: 8 mg/dL (ref 6–20)
CO2: 32 mmol/L (ref 22–32)
Calcium: 8.9 mg/dL (ref 8.9–10.3)
Chloride: 105 mmol/L (ref 98–111)
Creatinine: 0.58 mg/dL (ref 0.44–1.00)
GFR, Estimated: >60 mL/min (ref >60)
Glucose, Bld: 120 mg/dL — ABNORMAL HIGH (ref 70–99)
Potassium: 3.7 mmol/L (ref 3.5–5.1)
Sodium: 142 mmol/L (ref 135–145)
Total Bilirubin: 0.3 mg/dL (ref <1.2)
Total Protein: 6.7 g/dL (ref 6.5–8.1)

## 2023-07-19 LAB — CBC WITH DIFFERENTIAL (CANCER CENTER ONLY)
Abs Immature Granulocytes: 0.01 10*3/uL (ref 0.00–0.07)
Basophils Absolute: 0 10*3/uL (ref 0.0–0.1)
Basophils Relative: 1 %
Eosinophils Absolute: 0.2 10*3/uL (ref 0.0–0.5)
Eosinophils Relative: 6 %
HCT: 34.7 % — ABNORMAL LOW (ref 36.0–46.0)
Hemoglobin: 11.6 g/dL — ABNORMAL LOW (ref 12.0–15.0)
Immature Granulocytes: 0 %
Lymphocytes Relative: 28 %
Lymphs Abs: 1 10*3/uL (ref 0.7–4.0)
MCH: 31.4 pg (ref 26.0–34.0)
MCHC: 33.4 g/dL (ref 30.0–36.0)
MCV: 93.8 fL (ref 80.0–100.0)
Monocytes Absolute: 0.4 10*3/uL (ref 0.1–1.0)
Monocytes Relative: 10 %
Neutro Abs: 1.8 10*3/uL (ref 1.7–7.7)
Neutrophils Relative %: 55 %
Platelet Count: 272 10*3/uL (ref 150–400)
RBC: 3.7 MIL/uL — ABNORMAL LOW (ref 3.87–5.11)
RDW: 15.3 % (ref 11.5–15.5)
WBC Count: 3.4 10*3/uL — ABNORMAL LOW (ref 4.0–10.5)
nRBC: 0 % (ref 0.0–0.2)

## 2023-07-19 MED ORDER — SODIUM CHLORIDE 0.9% FLUSH
10.0000 mL | INTRAVENOUS | Status: DC | PRN
  Administered 2023-07-19: 10 mL

## 2023-07-19 MED ORDER — FAM-TRASTUZUMAB DERUXTECAN-NXKI CHEMO 100 MG IV SOLR
3.2000 mg/kg | Freq: Once | INTRAVENOUS | Status: AC
  Administered 2023-07-19: 300 mg via INTRAVENOUS
  Filled 2023-07-19: qty 15

## 2023-07-19 MED ORDER — FOSAPREPITANT DIMEGLUMINE INJECTION 150 MG
150.0000 mg | Freq: Once | INTRAVENOUS | Status: DC
  Filled 2023-07-19: qty 5

## 2023-07-19 MED ORDER — ACETAMINOPHEN 325 MG PO TABS
650.0000 mg | ORAL_TABLET | Freq: Once | ORAL | Status: AC
  Administered 2023-07-19: 650 mg via ORAL
  Filled 2023-07-19: qty 2

## 2023-07-19 MED ORDER — HEPARIN SOD (PORK) LOCK FLUSH 100 UNIT/ML IV SOLN
500.0000 [IU] | Freq: Once | INTRAVENOUS | Status: AC | PRN
  Administered 2023-07-19: 500 [IU]

## 2023-07-19 MED ORDER — SODIUM CHLORIDE 0.9% FLUSH
10.0000 mL | Freq: Once | INTRAVENOUS | Status: AC
  Administered 2023-07-19: 10 mL

## 2023-07-19 MED ORDER — DEXAMETHASONE SODIUM PHOSPHATE 10 MG/ML IJ SOLN
10.0000 mg | Freq: Once | INTRAMUSCULAR | Status: AC
  Administered 2023-07-19: 10 mg via INTRAVENOUS
  Filled 2023-07-19: qty 1

## 2023-07-19 MED ORDER — DEXTROSE 5 % IV SOLN: Freq: Once | INTRAVENOUS | Status: AC

## 2023-07-19 MED ORDER — SODIUM CHLORIDE 0.9 % IV SOLN
25.0000 mg | Freq: Once | INTRAVENOUS | Status: AC
Start: 2023-07-19 — End: 2023-07-19
  Administered 2023-07-19: 25 mg via INTRAVENOUS
  Filled 2023-07-19: qty 1

## 2023-07-19 MED ORDER — DIPHENHYDRAMINE HCL 25 MG PO CAPS
25.0000 mg | ORAL_CAPSULE | Freq: Once | ORAL | Status: AC
  Administered 2023-07-19: 25 mg via ORAL
  Filled 2023-07-19: qty 1

## 2023-07-19 NOTE — Assessment & Plan Note (Signed)
08/22/2019: PET CT scan: Metastatic disease in the chest with multiple lung nodules all subcentimeter size and hypermetabolic lymph nodes (right paratracheal node 7 mm, subcarinal node 1 cm, right hilar node 6 mm), retroperitoneal and upper pelvic common iliac lymph nodes, bone metastases especially L5, L4, T6, right 10th rib, left proximal femur   Bone biopsy was not felt to be reliable in addition to the effect of radiation on the bone. Palliative radiation completed 09/03/2019 [facility]: BRCA1 mutation, TMB 5.36, MSI: Normal   Prior treatment: Ibrance with letrozole and Xgeva for bone metastases switched to abemaciclib and Fulvestrant   Bone metastasis: Patient does not want to take bisphosphonates CT CAP 08/23/2022: New and enlarged pulmonary nodule consistent with progression.  Enlarged small low cervical and mediastinal nodes.  Multiple bone metastases minimally progressive.  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  Skull metastases, abnormal enhancement left globe  ---------------------------------------------------------------- Current treatment:: Enhertu cycle 13 Enhertu toxicities: Fatigue ANC 1.6 today: Okay to treat   CT CAP 03/12/2023: Improvement in pulmonary metastases.  No thoracic lymphadenopathy.  Similar hepatic lesions Brain MRI 04/16/2023: Unchanged leptomeningeal enhancement.   Upper respiratory infection: No more fevers.  Currently finishing levofloxacin and prednisone.  She is starting to feel a lot better.  Return to clinic in 3 weeks for cycle 13

## 2023-07-19 NOTE — Progress Notes (Signed)
Prer MD request, referral faxed to [facility] at [phone]

## 2023-07-19 NOTE — Patient Instructions (Signed)
[facility] - A DEPT OF [facility]  Discharge Instructions: Thank you for choosing [facility] to provide your oncology and hematology care.   If you have a lab appointment with the Cancer Center, please go directly to the Cancer Center and check in at the registration area.   Wear comfortable clothing and clothing appropriate for easy access to any Portacath or PICC line.   We strive to give you quality time with your provider. You may need to reschedule your appointment if you arrive late (15 or more minutes).  Arriving late affects you and other patients whose appointments are after yours.  Also, if you miss three or more appointments without notifying the office, you may be dismissed from the clinic at the provider's discretion.      For prescription refill requests, have your pharmacy contact our office and allow 72 hours for refills to be completed.    Today you received the following chemotherapy and/or immunotherapy agent: Fam-Trastuzumab (Enhertu)      To help prevent nausea and vomiting after your treatment, we encourage you to take your nausea medication as directed.  BELOW ARE SYMPTOMS THAT SHOULD BE REPORTED IMMEDIATELY: *FEVER GREATER THAN 100.4 F (38 C) OR HIGHER *CHILLS OR SWEATING *NAUSEA AND VOMITING THAT IS NOT CONTROLLED WITH YOUR NAUSEA MEDICATION *UNUSUAL SHORTNESS OF BREATH *UNUSUAL BRUISING OR BLEEDING *URINARY PROBLEMS (pain or burning when urinating, or frequent urination) *BOWEL PROBLEMS (unusual diarrhea, constipation, pain near the anus) TENDERNESS IN MOUTH AND THROAT WITH OR WITHOUT PRESENCE OF ULCERS (sore throat, sores in mouth, or a toothache) UNUSUAL RASH, SWELLING OR PAIN  UNUSUAL VAGINAL DISCHARGE OR ITCHING   Items with * indicate a potential emergency and should be followed up as soon as possible or go to the Emergency Department if any problems should occur.  Please show the CHEMOTHERAPY ALERT CARD or  IMMUNOTHERAPY ALERT CARD at check-in to the Emergency Department and triage nurse.  Should you have questions after your visit or need to cancel or reschedule your appointment, please contact [facility] - A DEPT OF [facility]  Dept: [phone]  and follow the prompts.  Office hours are 8:00 a.m. to 4:30 p.m. Monday - Friday. Please note that voicemails left after 4:00 p.m. may not be returned until the following business day.  We are closed weekends and major holidays. You have access to a nurse at all times for urgent questions. Please call the main number to the clinic Dept: [phone] and follow the prompts.   For any non-urgent questions, you may also contact your provider using MyChart. We now offer e-Visits for anyone 56 and older to request care online for non-urgent symptoms. For details visit [email or web address].   Also download the MyChart app! Go to the app store, search "MyChart", open the app, select [place], and log in with your MyChart username and password.  Fam-Trastuzumab Deruxtecan Injection What is this medication? FAM-TRASTUZUMAB DERUXTECAN (fam-tras TOOZ eu mab DER ux TEE kan) treats some types of cancer. It works by blocking a protein that causes cancer cells to grow and multiply. This helps to slow or stop the spread of cancer cells. This medicine may be used for other purposes; ask your health care provider or pharmacist if you have questions. COMMON BRAND NAME(S): ENHERTU What should I tell my care team before I take this medication? They need to know if you have any of  these conditions: Heart disease Heart failure Infection, especially a viral infection, such as chickenpox, cold sores, or herpes Liver disease Lung or breathing disease, such as asthma or COPD An unusual or allergic reaction to fam-trastuzumab deruxtecan, other medications, foods, dyes, or preservatives Pregnant or trying to get pregnant Breast-feeding How  should I use this medication? This medication is injected into a vein. It is given by your care team in a hospital or clinic setting. A special MedGuide will be given to you before each treatment. Be sure to read this information carefully each time. Talk to your care team about the use of this medication in children. Special care may be needed. Overdosage: If you think you have taken too much of this medicine contact a poison control center or emergency room at once. NOTE: This medicine is only for you. Do not share this medicine with others. What if I miss a dose? It is important not to miss your dose. Call your care team if you are unable to keep an appointment. What may interact with this medication? Interactions are not expected. This list may not describe all possible interactions. Give your health care provider a list of all the medicines, herbs, non-prescription drugs, or dietary supplements you use. Also tell them if you smoke, drink alcohol, or use illegal drugs. Some items may interact with your medicine. What should I watch for while using this medication? Visit your care team for regular checks on your progress. Tell your care team if your symptoms do not start to get better or if they get worse. Your condition will be monitored carefully while you are receiving this medication. Do not become pregnant while taking this medication or for 7 months after stopping it. Women should inform their care team if they wish to become pregnant or think they might be pregnant. Men should not father a child while taking this medication and for 4 months after stopping it. There is potential for serious side effects to an unborn child. Talk to your care team for more information. Do not breast-feed an infant while taking this medication or for 7 months after the last dose. This medication has caused decreased sperm counts in some men. This may make it more difficult to father a child. Talk to your care  team if you are concerned about your fertility. This medication may increase your risk to bruise or bleed. Call your care team if you notice any unusual bleeding. Be careful brushing or flossing your teeth or using a toothpick because you may get an infection or bleed more easily. If you have any dental work done, tell your dentist you are receiving this medication. This medication may cause dry eyes and blurred vision. If you wear contact lenses, you may feel some discomfort. Lubricating eye drops may help. See your care team if the problem does not go away or is severe. This medication may increase your risk of getting an infection. Call your care team for advice if you get a fever, chills, sore throat, or other symptoms of a cold or flu. Do not treat yourself. Try to avoid being around people who are sick. Avoid taking medications that contain aspirin, acetaminophen, ibuprofen, naproxen, or ketoprofen unless instructed by your care team. These medications may hide a fever. What side effects may I notice from receiving this medication? Side effects that you should report to your care team as soon as possible: Allergic reactions--skin rash, itching, hives, swelling of the face, lips, tongue, or throat  Dry cough, shortness of breath or trouble breathing Infection--fever, chills, cough, sore throat, wounds that don't heal, pain or trouble when passing urine, general feeling of discomfort or being unwell Heart failure--shortness of breath, swelling of the ankles, feet, or hands, sudden weight gain, unusual weakness or fatigue Unusual bruising or bleeding Side effects that usually do not require medical attention (report these to your care team if they continue or are bothersome): Constipation Diarrhea Hair loss Muscle pain Nausea Vomiting This list may not describe all possible side effects. Call your doctor for medical advice about side effects. You may report side effects to FDA at  1-800-FDA-1088. Where should I keep my medication? This medication is given in a hospital or clinic. It will not be stored at home. NOTE: This sheet is a summary. It may not cover all possible information. If you have questions about this medicine, talk to your doctor, pharmacist, or health care provider.  2024 Elsevier/Gold Standard (2021-05-10 00:00:00)

## 2023-07-19 NOTE — Progress Notes (Signed)
Patient Care Team: Patient, [name] Serena Croissant, MD as Medical Oncologist (Hematology and Oncology) [name], MD as Consulting Physician (Radiation Oncology) [name], NP as Nurse Practitioner (Hematology and Oncology) [name], MD as Consulting Physician (General Surgery) [name], RPH-CPP as Pharmacist (Hematology and Oncology)  DIAGNOSIS:  Encounter Diagnosis  Name Primary?   Malignant neoplasm of central portion of left breast in female, estrogen receptor positive (HCC) Yes    SUMMARY OF ONCOLOGIC HISTORY: Oncology History  Cancer of central portion of left female breast (HCC)  04/02/2014 Genetic Testing   BRCA1 [id number] pathogenic mutation identified on gene sequce and del/dup analyses of BRCA1 and BRCA2.  BRCA1 [id number] VUS identified as well.  The report date is 04/02/2014.  UPDATE: BRCA1 Z.[id number] VUS has been reclassified as a likely benign variant.  The reclassification date is 04/21/2020.   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 N0 M0 stage IA: Oncotype DX 13 (ROR 9%)   04/16/2014 Surgery   Left upper back melanoma resected no residual melanoma identified on re-resection margins negative   05/26/2014 - 06/08/2015 Anti-estrogen oral therapy   Tamoxifen 20 mg daily with a plan to switch her to aromatase inhibitors once she gets oophorectomy ( patient did not continue antiestrogen therapy by choice)   08/13/2014 Surgery   oophorectomy with total abdominal hysterectomy   09/08/2015 -  Anti-estrogen oral therapy   Resumed anastrozole after she found recurrence of breast cancer; stopped in January 2018, started letrozole 2.5 mg daily 10/12/2016; switched to Exemestane 25 mg daily on 01/03/17   09/16/2015 Surgery   Skin, left:  IDC grade 3; 0.5 cm, margins negative, ER 95%, PR 10%, HER-2 positive ratio 2.08   11/08/2015 - 02/2017 Chemotherapy   Herceptin every 3 weeks plus anastrozole   11/24/2015 - 01/14/2016 Radiation Therapy   Adjuvant radiation therapy by Dr. Roselind Messier   01/2017 -  Anti-estrogen oral therapy   Exemestane daily (stopped others due to side effects), due to muscle aches and pains switch to tamoxifen 5 mg daily starting 12/18/2017   08/07/2019 Relapse/Recurrence   Bone scan: Sclerotic metastatic lesions involving left femoral neck, right ninth rib and entire L5 vertebra; MRI left hip: Diffuse signal abnormality L5 vertebral body with possible extraosseous extension   08/22/2019 - 09/03/2019 Radiation Therapy   Palliative radiation with Dr. Roselind Messier to L5 and left proixmal femur.    10/2021 Treatment Plan Change   Fulvestrant and Verzenio   08/23/2022 Imaging   CT CAP 08/23/2022: New and enlarged pulmonary nodule consistent with progression.  Enlarged small low cervical and mediastinal nodes.  Multiple bone metastases minimally progressive.  Liver lesions evaluation is challenging because of hepatic steatosis with suspected liver progression, isolated tiny omental nodule     09/07/2022 PET scan   IMPRESSION: 1. Worsening of disease now with nodal, pulmonary and hepatic metastases. 2. Areas of variable FDG uptake with some new bone lesions and some areas of bony metastasis which are more focal, for instance in the pelvis where there was likely diffuse involvement on prior imaging. Many of the areas of sclerosis do not show substantial uptake as outlined above and some areas show improvement/resolution of FDG uptake seen previously but there are signs of active bony metastatic disease currently. 3. Likely LEFT adrenal adenoma. 4. Severe  hepatic steatosis. 5. Aortic atherosclerosis.   Primary malignant neoplasm of breast with metastasis (HCC)  08/25/2019 Initial Diagnosis   Primary malignant neoplasm of  breast with metastasis (HCC)   10/11/2022 -  Chemotherapy   Patient is on Treatment Plan : BREAST METASTATIC Fam-Trastuzumab Deruxtecan-nxki (Enhertu) (5.4) q21d       CHIEF COMPLIANT: Cycle 13 Enhertu  HISTORY OF PRESENT ILLNESS:   History of Present Illness   The patient, with a history of ongoing treatment for Met breast cancer on Enhertu, presents with constipation and sleepiness due to Phenergan. She prefers these side effects to the alternative. She also reports episodes of nausea and vomiting. The patient has been experiencing a clogged ear for six weeks, which has been causing hearing difficulties. She also mentions a concern with her eye, but reports no change in vision. The patient is also concerned about potential exposure to illness due to a sick boyfriend and a coworker who came to work while ill.         ALLERGIES:  is allergic to dilaudid [hydromorphone] and codeine.  MEDICATIONS:  Current Outpatient Medications  Medication Sig Dispense Refill   ALPRAZolam (XANAX) 0.25 MG tablet Take 1 tablet (0.25 mg total) by mouth at bedtime as needed for anxiety. 30 tablet 3   amoxicillin (AMOXIL) 500 MG tablet Take 1 tablet (500 mg total) by mouth 2 (two) times daily. 14 tablet 0   aspirin EC 81 MG tablet Take 1 tablet (81 mg total) by mouth daily. Swallow whole. 90 tablet 3   aspirin-acetaminophen-caffeine (EXCEDRIN MIGRAINE) 250-250-65 MG tablet Take 2 tablets by mouth every 6 (six) hours as needed for headache.     CALCIUM PO Take 1,200 mg by mouth 2 (two) times daily.     dexamethasone (DECADRON) 4 MG tablet Take 1 tablets (4 mg) by mouth daily for 3 days after chemotherapy. Take with food. 30 tablet 1   dicyclomine (BENTYL) 20 MG tablet Take 1 tablet (20 mg total) by mouth every 6 (six) hours as needed for up to 7 days for spasms. 28 tablet 0   esomeprazole (NEXIUM) 40 MG capsule Take 40 mg by mouth daily.     levofloxacin (LEVAQUIN) 750 MG tablet Take 1 tablet (750 mg total) by  mouth daily. 7 tablet 0   lidocaine-prilocaine (EMLA) cream Apply to affected area once 30 g 3   loperamide (IMODIUM) 2 MG capsule Take 2 mg by mouth daily as needed for diarrhea or loose stools. Take 2 tablets at first diarrhea and then 1 tablet after additional loose stool. Max number in 24 hours is 8 tablets.     loratadine (CLARITIN) 10 MG tablet Take 10 mg by mouth daily.     meloxicam (MOBIC) 15 MG tablet TAKE 1 TABLET(15 MG) BY MOUTH DAILY AS NEEDED FOR PAIN 30 tablet 1   ondansetron (ZOFRAN) 8 MG tablet Take 1 tablet (8 mg total) by mouth every 8 (eight) hours as needed for nausea or vomiting. Start on the third day after chemotherapy. 30 tablet 1   prochlorperazine (COMPAZINE) 10 MG tablet Take 1 tablet (10 mg total) by mouth every 6 (six) hours as needed for nausea or vomiting. 30 tablet 1   rosuvastatin (CRESTOR) 10 MG tablet Take 1 tablet (10 mg total) by mouth daily. 90 tablet 3   traMADol (ULTRAM) 50 MG tablet Take 1 tablet (50 mg total) by mouth every 6 (six) hours as needed. 10 tablet 0   traZODone (DESYREL) 50 MG tablet TAKE  1 TABLET(50 MG) BY MOUTH AT BEDTIME 90 tablet 3   venlafaxine XR (EFFEXOR-XR) 75 MG 24 hr capsule TAKE 1 CAPSULE(75 MG) BY MOUTH AT BEDTIME 90 capsule 3   No current facility-administered medications for this visit.    PHYSICAL EXAMINATION: ECOG PERFORMANCE STATUS: 1 - Symptomatic but completely ambulatory  Vitals:   07/19/23 1413  BP: (!) 145/79  Pulse: 78  Resp: 18  Temp: 98 F (36.7 C)  SpO2: 99%   Filed Weights   07/19/23 1413  Weight: 230 lb (104.3 kg)    Physical Exam   HEENT: Fluid observed behind the right ear, no infection noted. Large wax chunk present in the ear canal.      (exam performed in the presence of a chaperone)  LABORATORY DATA:  I have reviewed the data as listed    Latest Ref Rng & Units 06/28/2023    2:08 PM 06/07/2023    2:04 PM 05/17/2023    2:16 PM  CMP  Glucose 70 - 99 mg/dL 130  85  82   BUN 6 - 20 mg/dL  11  8  14    Creatinine 0.44 - 1.00 mg/dL 8.65  7.84  6.96   Sodium 135 - 145 mmol/L 143  140  141   Potassium 3.5 - 5.1 mmol/L 3.8  3.9  4.2   Chloride 98 - 111 mmol/L 105  105  106   CO2 22 - 32 mmol/L 32  28  29   Calcium 8.9 - 10.3 mg/dL 9.2  9.3  9.3   Total Protein 6.5 - 8.1 g/dL 7.2  7.0  6.6   Total Bilirubin <1.2 mg/dL 0.2  0.5  0.3   Alkaline Phos 38 - 126 U/L 82  78  79   AST 15 - 41 U/L 16  28  25    ALT 0 - 44 U/L 18  37  36     Lab Results  Component Value Date   WBC 3.4 (L) 07/19/2023   HGB 11.6 (L) 07/19/2023   HCT 34.7 (L) 07/19/2023   MCV 93.8 07/19/2023   PLT 272 07/19/2023   NEUTROABS 1.8 07/19/2023    ASSESSMENT & PLAN:  Cancer of central portion of left female breast (HCC) 08/22/2019: PET CT scan: Metastatic disease in the chest with multiple lung nodules all subcentimeter size and hypermetabolic lymph nodes (right paratracheal node 7 mm, subcarinal node 1 cm, right hilar node 6 mm), retroperitoneal and upper pelvic common iliac lymph nodes, bone metastases especially L5, L4, T6, right 10th rib, left proximal femur   Bone biopsy was not felt to be reliable in addition to the effect of radiation on the bone. Palliative radiation completed 09/03/2019 [facility]: BRCA1 mutation, TMB 5.36, MSI: Normal   Prior treatment: Ibrance with letrozole and Xgeva for bone metastases switched to abemaciclib and Fulvestrant   Bone metastasis: Patient does not want to take bisphosphonates CT CAP 08/23/2022: New and enlarged pulmonary nodule consistent with progression.  Enlarged small low cervical and mediastinal nodes.  Multiple bone metastases minimally progressive.  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  Skull metastases, abnormal enhancement left globe  ---------------------------------------------------------------- Current treatment:: Enhertu cycle 14 Enhertu  toxicities: Fatigue ANC 1.8 today: Okay to treat   CT CAP 03/12/2023: Improvement in pulmonary metastases.  No thoracic lymphadenopathy.  Similar hepatic lesions Brain MRI 04/16/2023: Unchanged leptomeningeal enhancement.   Upper respiratory infection: No more fevers.  Currently finishing levofloxacin and prednisone.  She is starting to feel a lot better.  Return to clinic in 3 weeks for cycle 13       Chemotherapy Side Effects Reports of nausea and vomiting. Phenergan causing sleepiness but preferred over constipation. -Continue Phenergan as needed for nausea. -Can take additional nausea medication as needed.  Otitis Media with Effusion Reports of clogged ear and hearing loss. Fluid and wax buildup noted in ear. -Refer to ENT for further evaluation and management. -Advise to use Q-tip to remove superficial wax.  Eye Concerns Reports of discomfort in eye after rubbing it. No changes in vision reported. -Advise to see a retinal specialist or ophthalmologist for further evaluation.  Infection Prevention Concerns about exposure to sick individuals at work and at home. -Advise to continue wearing masks and practicing good hand hygiene. -Continue eating a healthy diet to support immune system.  Follow-up Continue monitoring blood counts during chemotherapy treatments. Neutrophils stable at 1.8. Hemoglobin and platelets within normal limits.         No orders of the defined types were placed in this encounter.  The patient has a good understanding of the overall plan. she agrees with it. she will call with any problems that may develop before the next visit here. Total time spent: 30 mins including face to face time and time spent for  planning, charting and co-ordination of care   Tamsen Meek, MD 07/19/23

## 2023-07-27 ENCOUNTER — Other Ambulatory Visit: Payer: Self-pay | Admitting: Hematology and Oncology

## 2023-08-07 MED FILL — Fosaprepitant Dimeglumine For IV Infusion 150 MG (Base Eq): INTRAVENOUS | Qty: 5 | Status: AC

## 2023-08-08 ENCOUNTER — Other Ambulatory Visit: Payer: Self-pay

## 2023-08-09 ENCOUNTER — Other Ambulatory Visit: Payer: Commercial Managed Care - PPO

## 2023-08-09 ENCOUNTER — Inpatient Hospital Stay: Payer: Commercial Managed Care - PPO

## 2023-08-09 ENCOUNTER — Ambulatory Visit: Payer: Commercial Managed Care - PPO

## 2023-08-09 ENCOUNTER — Inpatient Hospital Stay: Payer: Commercial Managed Care - PPO | Admitting: Hematology and Oncology

## 2023-08-09 ENCOUNTER — Ambulatory Visit: Payer: Commercial Managed Care - PPO | Admitting: Adult Health

## 2023-08-09 ENCOUNTER — Inpatient Hospital Stay: Payer: Commercial Managed Care - PPO | Attending: Hematology and Oncology

## 2023-08-09 VITALS — BP 139/77 | HR 86 | Temp 98.2°F | Resp 16 | Wt 228.6 lb

## 2023-08-09 VITALS — BP 134/80 | HR 81 | Temp 98.2°F | Resp 20

## 2023-08-09 DIAGNOSIS — Z7952 Long term (current) use of systemic steroids: Secondary | ICD-10-CM | POA: Diagnosis not present

## 2023-08-09 DIAGNOSIS — R110 Nausea: Secondary | ICD-10-CM | POA: Diagnosis not present

## 2023-08-09 DIAGNOSIS — Z7982 Long term (current) use of aspirin: Secondary | ICD-10-CM | POA: Insufficient documentation

## 2023-08-09 DIAGNOSIS — C50112 Malignant neoplasm of central portion of left female breast: Secondary | ICD-10-CM | POA: Insufficient documentation

## 2023-08-09 DIAGNOSIS — Z1501 Genetic susceptibility to malignant neoplasm of breast: Secondary | ICD-10-CM | POA: Insufficient documentation

## 2023-08-09 DIAGNOSIS — Z923 Personal history of irradiation: Secondary | ICD-10-CM | POA: Diagnosis not present

## 2023-08-09 DIAGNOSIS — Z79811 Long term (current) use of aromatase inhibitors: Secondary | ICD-10-CM | POA: Insufficient documentation

## 2023-08-09 DIAGNOSIS — C50919 Malignant neoplasm of unspecified site of unspecified female breast: Secondary | ICD-10-CM

## 2023-08-09 DIAGNOSIS — K5900 Constipation, unspecified: Secondary | ICD-10-CM | POA: Insufficient documentation

## 2023-08-09 DIAGNOSIS — Z79899 Other long term (current) drug therapy: Secondary | ICD-10-CM | POA: Diagnosis not present

## 2023-08-09 DIAGNOSIS — Z9221 Personal history of antineoplastic chemotherapy: Secondary | ICD-10-CM | POA: Diagnosis not present

## 2023-08-09 DIAGNOSIS — R5383 Other fatigue: Secondary | ICD-10-CM | POA: Diagnosis not present

## 2023-08-09 DIAGNOSIS — Z85820 Personal history of malignant melanoma of skin: Secondary | ICD-10-CM | POA: Insufficient documentation

## 2023-08-09 DIAGNOSIS — I700 Atherosclerosis of aorta: Secondary | ICD-10-CM | POA: Diagnosis not present

## 2023-08-09 DIAGNOSIS — Z95828 Presence of other vascular implants and grafts: Secondary | ICD-10-CM

## 2023-08-09 DIAGNOSIS — C7951 Secondary malignant neoplasm of bone: Secondary | ICD-10-CM | POA: Insufficient documentation

## 2023-08-09 DIAGNOSIS — K760 Fatty (change of) liver, not elsewhere classified: Secondary | ICD-10-CM | POA: Insufficient documentation

## 2023-08-09 LAB — CMP (CANCER CENTER ONLY)
ALT: 33 U/L (ref 0–44)
AST: 27 U/L (ref 15–41)
Albumin: 4 g/dL (ref 3.5–5.0)
Alkaline Phosphatase: 74 U/L (ref 38–126)
Anion gap: 5 (ref 5–15)
BUN: 7 mg/dL (ref 6–20)
CO2: 30 mmol/L (ref 22–32)
Calcium: 9.4 mg/dL (ref 8.9–10.3)
Chloride: 105 mmol/L (ref 98–111)
Creatinine: 0.61 mg/dL (ref 0.44–1.00)
GFR, Estimated: >60 mL/min (ref >60)
Glucose, Bld: 112 mg/dL — ABNORMAL HIGH (ref 70–99)
Potassium: 4 mmol/L (ref 3.5–5.1)
Sodium: 140 mmol/L (ref 135–145)
Total Bilirubin: 0.3 mg/dL (ref 0.0–1.2)
Total Protein: 7.1 g/dL (ref 6.5–8.1)

## 2023-08-09 LAB — CBC WITH DIFFERENTIAL (CANCER CENTER ONLY)
Abs Immature Granulocytes: 0.01 10*3/uL (ref 0.00–0.07)
Basophils Absolute: 0.1 10*3/uL (ref 0.0–0.1)
Basophils Relative: 1 %
Eosinophils Absolute: 0.3 10*3/uL (ref 0.0–0.5)
Eosinophils Relative: 6 %
HCT: 36 % (ref 36.0–46.0)
Hemoglobin: 12.4 g/dL (ref 12.0–15.0)
Immature Granulocytes: 0 %
Lymphocytes Relative: 28 %
Lymphs Abs: 1.2 10*3/uL (ref 0.7–4.0)
MCH: 32.1 pg (ref 26.0–34.0)
MCHC: 34.4 g/dL (ref 30.0–36.0)
MCV: 93.3 fL (ref 80.0–100.0)
Monocytes Absolute: 0.4 10*3/uL (ref 0.1–1.0)
Monocytes Relative: 8 %
Neutro Abs: 2.4 10*3/uL (ref 1.7–7.7)
Neutrophils Relative %: 57 %
Platelet Count: 262 10*3/uL (ref 150–400)
RBC: 3.86 MIL/uL — ABNORMAL LOW (ref 3.87–5.11)
RDW: 15 % (ref 11.5–15.5)
WBC Count: 4.3 10*3/uL (ref 4.0–10.5)
nRBC: 0 % (ref 0.0–0.2)

## 2023-08-09 MED ORDER — DEXAMETHASONE SODIUM PHOSPHATE 10 MG/ML IJ SOLN
10.0000 mg | Freq: Once | INTRAMUSCULAR | Status: AC
  Administered 2023-08-09: 10 mg via INTRAVENOUS
  Filled 2023-08-09: qty 1

## 2023-08-09 MED ORDER — SODIUM CHLORIDE 0.9% FLUSH
10.0000 mL | Freq: Once | INTRAVENOUS | Status: AC
Start: 2023-08-09 — End: 2023-08-09
  Administered 2023-08-09: 10 mL via INTRAVENOUS

## 2023-08-09 MED ORDER — HEPARIN SOD (PORK) LOCK FLUSH 100 UNIT/ML IV SOLN
500.0000 [IU] | Freq: Once | INTRAVENOUS | Status: AC | PRN
  Administered 2023-08-09: 500 [IU]

## 2023-08-09 MED ORDER — DIPHENHYDRAMINE HCL 25 MG PO CAPS
25.0000 mg | ORAL_CAPSULE | Freq: Once | ORAL | Status: AC
  Administered 2023-08-09: 25 mg via ORAL
  Filled 2023-08-09: qty 1

## 2023-08-09 MED ORDER — FAM-TRASTUZUMAB DERUXTECAN-NXKI CHEMO 100 MG IV SOLR
3.2000 mg/kg | Freq: Once | INTRAVENOUS | Status: AC
  Administered 2023-08-09: 300 mg via INTRAVENOUS
  Filled 2023-08-09: qty 15

## 2023-08-09 MED ORDER — SODIUM CHLORIDE 0.9% FLUSH
10.0000 mL | INTRAVENOUS | Status: DC | PRN
  Administered 2023-08-09: 10 mL

## 2023-08-09 MED ORDER — ACETAMINOPHEN 325 MG PO TABS
650.0000 mg | ORAL_TABLET | Freq: Once | ORAL | Status: AC
Start: 2023-08-09 — End: 2023-08-09
  Administered 2023-08-09: 650 mg via ORAL
  Filled 2023-08-09: qty 2

## 2023-08-09 MED ORDER — SODIUM CHLORIDE 0.9 % IV SOLN
150.0000 mg | Freq: Once | INTRAVENOUS | Status: AC
  Administered 2023-08-09: 150 mg via INTRAVENOUS
  Filled 2023-08-09: qty 150

## 2023-08-09 MED ORDER — DEXTROSE 5 % IV SOLN: Freq: Once | INTRAVENOUS | Status: AC

## 2023-08-09 MED ORDER — PROMETHAZINE HCL 25 MG/ML IJ SOLN
25.0000 mg | Freq: Once | INTRAMUSCULAR | Status: AC
  Administered 2023-08-09: 25 mg via INTRAVENOUS
  Filled 2023-08-09: qty 1

## 2023-08-09 NOTE — Progress Notes (Signed)
 Patient Care Team: Patient, [name] [name], MD as Medical Oncologist (Hematology and Oncology) [name], MD as Consulting Physician (Radiation Oncology) [name], NP as Nurse Practitioner (Hematology and Oncology) [name], MD as Consulting Physician (General Surgery) [name], RPH-CPP as Pharmacist (Hematology and Oncology)  DIAGNOSIS:  No diagnosis found.   SUMMARY OF ONCOLOGIC HISTORY: Oncology History  Cancer of central portion of left female breast (HCC)  04/02/2014 Genetic Testing   BRCA1 [id number] pathogenic mutation identified on gene sequce and del/dup analyses of BRCA1 and BRCA2.  BRCA1 [id number] VUS identified as well.  The report date is 04/02/2014.  UPDATE: BRCA1 [id number] VUS has been reclassified as a likely benign variant.  The reclassification date is 04/21/2020.   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 N0 M0 stage IA: Oncotype DX 13 (ROR 9%)   04/16/2014 Surgery   Left upper back melanoma resected no residual melanoma identified on re-resection margins negative   05/26/2014 - 06/08/2015 Anti-estrogen oral therapy   Tamoxifen  20 mg daily with a plan to switch her to aromatase inhibitors once she gets oophorectomy ( patient did not continue antiestrogen therapy by choice)   08/13/2014 Surgery   oophorectomy with total abdominal hysterectomy   09/08/2015 -  Anti-estrogen oral therapy   Resumed anastrozole  after she found recurrence of breast cancer; stopped in January 2018, started letrozole  2.5 mg daily 10/12/2016; switched to Exemestane  25 mg daily on 01/03/17   09/16/2015 Surgery   Skin, left: IDC grade 3; 0.5 cm, margins negative, ER 95%, PR 10%, HER-2 positive ratio 2.08   11/08/2015 - 02/2017 Chemotherapy    Herceptin  every 3 weeks plus anastrozole    11/24/2015 - 01/14/2016 Radiation Therapy   Adjuvant radiation therapy by Dr. Shannon   01/2017 -  Anti-estrogen oral therapy   Exemestane  daily (stopped others due to side effects), due to muscle aches and pains switch to tamoxifen  5 mg daily starting 12/18/2017   08/07/2019 Relapse/Recurrence   Bone scan: Sclerotic metastatic lesions involving left femoral neck, right ninth rib and entire L5 vertebra; MRI left hip: Diffuse signal abnormality L5 vertebral body with possible extraosseous extension   08/22/2019 - 09/03/2019 Radiation Therapy   Palliative radiation with Dr. Shannon to L5 and left proixmal femur.    10/2021 Treatment Plan Change   Fulvestrant  and Verzenio    08/23/2022 Imaging   CT CAP 08/23/2022: New and enlarged pulmonary nodule consistent with progression.  Enlarged small low cervical and mediastinal nodes.  Multiple bone metastases minimally progressive.  Liver lesions evaluation is challenging because of hepatic steatosis with suspected liver progression, isolated tiny omental nodule     09/07/2022 PET scan   IMPRESSION: 1. Worsening of disease now with nodal, pulmonary and hepatic metastases. 2. Areas of variable FDG uptake with some new bone lesions and some areas of bony metastasis which are more focal, for instance in the pelvis where there was likely diffuse involvement on prior imaging. Many of the areas of sclerosis do not show substantial uptake as outlined above and some areas show improvement/resolution of FDG uptake seen previously but there are signs of active bony metastatic disease currently. 3. Likely LEFT adrenal adenoma. 4. Severe hepatic steatosis. 5. Aortic atherosclerosis.   Primary malignant neoplasm of breast with metastasis (HCC)  08/25/2019 Initial Diagnosis  Primary malignant neoplasm of breast with metastasis (HCC)   10/11/2022 -  Chemotherapy   Patient is on Treatment Plan : BREAST METASTATIC  Fam-Trastuzumab Deruxtecan-nxki  (Enhertu ) (5.4) q21d       CHIEF COMPLIANT: Cycle 13 Enhertu   HISTORY OF PRESENT ILLNESS:   History of Present Illness       Patient is here for follow-up before her next planned cycle of Enhertu .  She is experiencing some nausea, fatigue and constipation, overall categorized as mild.  She has recently switched to Phenergan  for nausea and this has also improved her constipation.  She denies any other complaints and is looking forward to her next scans.  She denies any chest pain, chest pressure, shortness of breath, lower extremity swelling. Rest of the pertinent 10 point ROS reviewed and negative     ALLERGIES:  is allergic to dilaudid  [hydromorphone ] and codeine.  MEDICATIONS:  Current Outpatient Medications  Medication Sig Dispense Refill   ALPRAZolam  (XANAX ) 0.25 MG tablet Take 1 tablet (0.25 mg total) by mouth at bedtime as needed for anxiety. 30 tablet 3   amoxicillin  (AMOXIL ) 500 MG tablet Take 1 tablet (500 mg total) by mouth 2 (two) times daily. 14 tablet 0   aspirin  EC 81 MG tablet Take 1 tablet (81 mg total) by mouth daily. Swallow whole. 90 tablet 3   aspirin -acetaminophen -caffeine (EXCEDRIN MIGRAINE) 250-250-65 MG tablet Take 2 tablets by mouth every 6 (six) hours as needed for headache.     CALCIUM  PO Take 1,200 mg by mouth 2 (two) times daily.     dexamethasone  (DECADRON ) 4 MG tablet Take 1 tablets (4 mg) by mouth daily for 3 days after chemotherapy. Take with food. 30 tablet 1   dicyclomine  (BENTYL ) 20 MG tablet Take 1 tablet (20 mg total) by mouth every 6 (six) hours as needed for up to 7 days for spasms. 28 tablet 0   esomeprazole (NEXIUM) 40 MG capsule Take 40 mg by mouth daily.     levofloxacin  (LEVAQUIN ) 750 MG tablet Take 1 tablet (750 mg total) by mouth daily. 7 tablet 0   lidocaine -prilocaine  (EMLA ) cream Apply to affected area once 30 g 3   loperamide (IMODIUM) 2 MG capsule Take 2 mg by mouth daily as needed for diarrhea or loose  stools. Take 2 tablets at first diarrhea and then 1 tablet after additional loose stool. Max number in 24 hours is 8 tablets.     loratadine (CLARITIN) 10 MG tablet Take 10 mg by mouth daily.     meloxicam  (MOBIC ) 15 MG tablet TAKE 1 TABLET(15 MG) BY MOUTH DAILY AS NEEDED FOR PAIN 30 tablet 1   ondansetron  (ZOFRAN ) 8 MG tablet Take 1 tablet (8 mg total) by mouth every 8 (eight) hours as needed for nausea or vomiting. Start on the third day after chemotherapy. 30 tablet 1   prochlorperazine  (COMPAZINE ) 10 MG tablet Take 1 tablet (10 mg total) by mouth every 6 (six) hours as needed for nausea or vomiting. 30 tablet 1   rosuvastatin  (CRESTOR ) 10 MG tablet Take 1 tablet (10 mg total) by mouth daily. 90 tablet 3   traMADol  (ULTRAM ) 50 MG tablet Take 1 tablet (50 mg total) by mouth every 6 (six) hours as needed. 10 tablet 0   traZODone  (DESYREL ) 50 MG tablet TAKE 1 TABLET(50 MG) BY MOUTH AT BEDTIME 90 tablet 3   venlafaxine  XR (EFFEXOR -XR) 75 MG 24 hr capsule TAKE 1 CAPSULE(75 MG) BY MOUTH AT BEDTIME 90 capsule 3   No current  facility-administered medications for this visit.    PHYSICAL EXAMINATION: ECOG PERFORMANCE STATUS: 1 - Symptomatic but completely ambulatory  Vitals:   08/09/23 1414  BP: 139/77  Pulse: 86  Resp: 16  Temp: 98.2 F (36.8 C)  SpO2: 98%   Filed Weights   08/09/23 1414  Weight: 228 lb 9.6 oz (103.7 kg)    Physical Exam Constitutional:      Appearance: Normal appearance.  Cardiovascular:     Rate and Rhythm: Normal rate and regular rhythm.     Pulses: Normal pulses.     Heart sounds: Normal heart sounds.  Pulmonary:     Effort: Pulmonary effort is normal.     Breath sounds: Normal breath sounds.  Musculoskeletal:        General: No swelling or tenderness.     Cervical back: Normal range of motion and neck supple. No rigidity.  Lymphadenopathy:     Cervical: No cervical adenopathy.  Skin:    General: Skin is warm and dry.  Neurological:     General: No focal  deficit present.     Mental Status: She is alert.    LABORATORY DATA:  I have reviewed the data as listed    Latest Ref Rng & Units 08/09/2023    1:36 PM 07/19/2023    1:53 PM 06/28/2023    2:08 PM  CMP  Glucose 70 - 99 mg/dL 887  879  867   BUN 6 - 20 mg/dL 7  8  11    Creatinine 0.44 - 1.00 mg/dL 9.38  9.41  9.27   Sodium 135 - 145 mmol/L 140  142  143   Potassium 3.5 - 5.1 mmol/L 4.0  3.7  3.8   Chloride 98 - 111 mmol/L 105  105  105   CO2 22 - 32 mmol/L 30  32  32   Calcium  8.9 - 10.3 mg/dL 9.4  8.9  9.2   Total Protein 6.5 - 8.1 g/dL 7.1  6.7  7.2   Total Bilirubin 0.0 - 1.2 mg/dL 0.3  0.3  0.2   Alkaline Phos 38 - 126 U/L 74  68  82   AST 15 - 41 U/L 27  27  16    ALT 0 - 44 U/L 33  29  18     Lab Results  Component Value Date   WBC 4.3 08/09/2023   HGB 12.4 08/09/2023   HCT 36.0 08/09/2023   MCV 93.3 08/09/2023   PLT 262 08/09/2023   NEUTROABS 2.4 08/09/2023    ASSESSMENT & PLAN:  Cancer of central portion of left female breast (HCC) 08/22/2019: PET CT scan: Metastatic disease in the chest with multiple lung nodules all subcentimeter size and hypermetabolic lymph nodes (right paratracheal node 7 mm, subcarinal node 1 cm, right hilar node 6 mm), retroperitoneal and upper pelvic common iliac lymph nodes, bone metastases especially L5, L4, T6, right 10th rib, left proximal femur   Bone biopsy was not felt to be reliable in addition to the effect of radiation on the bone. Palliative radiation completed 09/03/2019 [facility]: BRCA1 mutation, TMB 5.36, MSI: Normal   Prior treatment: Ibrance  with letrozole  and Xgeva for bone metastases switched to abemaciclib  and Fulvestrant    Bone metastasis: Patient does not want to take bisphosphonates CT CAP 08/23/2022: New and enlarged pulmonary nodule consistent with progression.  Enlarged small low cervical and mediastinal nodes.  Multiple bone metastases minimally progressive.  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  Skull metastases, abnormal enhancement left globe  ---------------------------------------------------------------- Current treatment:: Enhertu  cycle 15 Enhertu  toxicities: Fatigue Nausea   CT CAP 03/12/2023: Improvement in pulmonary metastases.  No thoracic lymphadenopathy.  Similar hepatic lesions Brain MRI 04/16/2023: Unchanged leptomeningeal enhancement.     Metastatic Cancer On Enhertu  for 14 cycles since February.  -She appears to be tolerating this very well except for mild fatigue, nausea and constipation  -Continue current treatment with Enhertu . -Schedule next scan as ordered by Dr. Gudena.  Nausea and Constipation Previously on anti-nausea medication causing constipation, switched to Phenergan  which has improved symptoms. -Continue Phenergan  for nausea.  Fatigue Chronic fatigue, no change. -Continue current management.  Echocardiogram Last echocardiogram in August, she has no new cardiac symptoms concerning for toxicity.  Repeat echo ordered.  Okay to proceed with treatment today since she is clinically asymptomatic  Follow-up Continue with scheduled treatments and scans. Follow-up with Dr. Gudena for review of echocardiogram and scan results.  No orders of the defined types were placed in this encounter.  The patient has a good understanding of the overall plan. she agrees with it. she will call with any problems that may develop before the next visit here. Total time spent: 30 mins including face to face time and time spent for planning, charting and co-ordination of care   Amber Stalls, MD 08/09/23

## 2023-08-09 NOTE — Patient Instructions (Signed)
 [facility] - A DEPT OF [facility]  Discharge Instructions: Thank you for choosing [facility] to provide your oncology and hematology care.   If you have a lab appointment with the Cancer Center, please go directly to the Cancer Center and check in at the registration area.   Wear comfortable clothing and clothing appropriate for easy access to any Portacath or PICC line.   We strive to give you quality time with your provider. You may need to reschedule your appointment if you arrive late (15 or more minutes).  Arriving late affects you and other patients whose appointments are after yours.  Also, if you miss three or more appointments without notifying the office, you may be dismissed from the clinic at the provider's discretion.      For prescription refill requests, have your pharmacy contact our office and allow 72 hours for refills to be completed.    Today you received the following chemotherapy and/or immunotherapy agents enhertu      To help prevent nausea and vomiting after your treatment, we encourage you to take your nausea medication as directed.  BELOW ARE SYMPTOMS THAT SHOULD BE REPORTED IMMEDIATELY: *FEVER GREATER THAN 100.4 F (38 C) OR HIGHER *CHILLS OR SWEATING *NAUSEA AND VOMITING THAT IS NOT CONTROLLED WITH YOUR NAUSEA MEDICATION *UNUSUAL SHORTNESS OF BREATH *UNUSUAL BRUISING OR BLEEDING *URINARY PROBLEMS (pain or burning when urinating, or frequent urination) *BOWEL PROBLEMS (unusual diarrhea, constipation, pain near the anus) TENDERNESS IN MOUTH AND THROAT WITH OR WITHOUT PRESENCE OF ULCERS (sore throat, sores in mouth, or a toothache) UNUSUAL RASH, SWELLING OR PAIN  UNUSUAL VAGINAL DISCHARGE OR ITCHING   Items with * indicate a potential emergency and should be followed up as soon as possible or go to the Emergency Department if any problems should occur.  Please show the CHEMOTHERAPY ALERT CARD or IMMUNOTHERAPY  ALERT CARD at check-in to the Emergency Department and triage nurse.  Should you have questions after your visit or need to cancel or reschedule your appointment, please contact [facility] - A DEPT [facility]  Dept: [phone]  and follow the prompts.  Office hours are 8:00 a.m. to 4:30 p.m. Monday - Friday. Please note that voicemails left after 4:00 p.m. may not be returned until the following business day.  We are closed weekends and major holidays. You have access to a nurse at all times for urgent questions. Please call the main number to the clinic Dept: [phone] and follow the prompts.   For any non-urgent questions, you may also contact your provider using MyChart. We now offer e-Visits for anyone 60 and older to request care online for non-urgent symptoms. For details visit [email or web address].   Also download the MyChart app! Go to the app store, search "MyChart", open the app, select [place], and log in with your MyChart username and password.

## 2023-08-09 NOTE — Progress Notes (Signed)
 Per Dr. Al Pimple, ok for treatment today with ECHO 04/03/23 EF 50-55%.

## 2023-08-10 ENCOUNTER — Other Ambulatory Visit: Payer: Self-pay

## 2023-08-12 ENCOUNTER — Other Ambulatory Visit: Payer: Self-pay

## 2023-08-15 ENCOUNTER — Ambulatory Visit (HOSPITAL_COMMUNITY): Payer: Commercial Managed Care - PPO

## 2023-08-17 ENCOUNTER — Other Ambulatory Visit: Payer: Self-pay

## 2023-08-22 ENCOUNTER — Encounter: Payer: Self-pay | Admitting: Hematology and Oncology

## 2023-08-23 ENCOUNTER — Ambulatory Visit (HOSPITAL_COMMUNITY)
Admission: RE | Admit: 2023-08-23 | Discharge: 2023-08-23 | Disposition: A | Discharge status: Home / Self Care | Payer: Commercial Managed Care - PPO | Source: Ambulatory Visit | Attending: Hematology and Oncology | Admitting: Hematology and Oncology

## 2023-08-23 DIAGNOSIS — C50919 Malignant neoplasm of unspecified site of unspecified female breast: Secondary | ICD-10-CM | POA: Diagnosis present

## 2023-08-23 DIAGNOSIS — I351 Nonrheumatic aortic (valve) insufficiency: Secondary | ICD-10-CM | POA: Insufficient documentation

## 2023-08-23 DIAGNOSIS — Z87891 Personal history of nicotine dependence: Secondary | ICD-10-CM | POA: Diagnosis not present

## 2023-08-23 DIAGNOSIS — Z0189 Encounter for other specified special examinations: Secondary | ICD-10-CM

## 2023-08-23 LAB — ECHOCARDIOGRAM COMPLETE
AR max vel: 2.03 cm2
AV Area VTI: 2.24 cm2
AV Area mean vel: 2.07 cm2
AV Mean grad: 3 mm[Hg]
AV Peak grad: 6.3 mm[Hg]
Ao pk vel: 1.26 m/s
Area-P 1/2: 4.1 cm2
S' Lateral: 3.6 cm

## 2023-08-26 ENCOUNTER — Other Ambulatory Visit: Payer: Self-pay

## 2023-08-28 ENCOUNTER — Other Ambulatory Visit (HOSPITAL_COMMUNITY): Payer: Commercial Managed Care - PPO

## 2023-08-29 MED FILL — Fosaprepitant Dimeglumine For IV Infusion 150 MG (Base Eq): INTRAVENOUS | Qty: 5 | Status: AC

## 2023-08-30 ENCOUNTER — Inpatient Hospital Stay: Payer: Commercial Managed Care - PPO | Admitting: Hematology and Oncology

## 2023-08-30 ENCOUNTER — Ambulatory Visit: Payer: Commercial Managed Care - PPO

## 2023-08-30 ENCOUNTER — Inpatient Hospital Stay: Payer: Commercial Managed Care - PPO

## 2023-08-30 ENCOUNTER — Ambulatory Visit: Payer: Commercial Managed Care - PPO | Admitting: Hematology and Oncology

## 2023-08-30 ENCOUNTER — Encounter: Payer: Self-pay | Admitting: Hematology and Oncology

## 2023-08-30 ENCOUNTER — Other Ambulatory Visit: Payer: Commercial Managed Care - PPO

## 2023-08-30 VITALS — BP 120/73 | HR 88 | Resp 18

## 2023-08-30 DIAGNOSIS — C50919 Malignant neoplasm of unspecified site of unspecified female breast: Secondary | ICD-10-CM

## 2023-08-30 DIAGNOSIS — C7951 Secondary malignant neoplasm of bone: Secondary | ICD-10-CM

## 2023-08-30 DIAGNOSIS — Z170 Estrogen receptor positive status [ER+]: Secondary | ICD-10-CM | POA: Diagnosis not present

## 2023-08-30 DIAGNOSIS — C50112 Malignant neoplasm of central portion of left female breast: Secondary | ICD-10-CM | POA: Diagnosis not present

## 2023-08-30 DIAGNOSIS — Z95828 Presence of other vascular implants and grafts: Secondary | ICD-10-CM

## 2023-08-30 LAB — CMP (CANCER CENTER ONLY)
ALT: 43 U/L (ref 0–44)
AST: 31 U/L (ref 15–41)
Albumin: 4.1 g/dL (ref 3.5–5.0)
Alkaline Phosphatase: 69 U/L (ref 38–126)
Anion gap: 9 (ref 5–15)
BUN: 11 mg/dL (ref 6–20)
CO2: 28 mmol/L (ref 22–32)
Calcium: 9.1 mg/dL (ref 8.9–10.3)
Chloride: 102 mmol/L (ref 98–111)
Creatinine: 0.72 mg/dL (ref 0.44–1.00)
GFR, Estimated: >60 mL/min (ref >60)
Glucose, Bld: 131 mg/dL — ABNORMAL HIGH (ref 70–99)
Potassium: 4.1 mmol/L (ref 3.5–5.1)
Sodium: 139 mmol/L (ref 135–145)
Total Bilirubin: 0.4 mg/dL (ref 0.0–1.2)
Total Protein: 6.9 g/dL (ref 6.5–8.1)

## 2023-08-30 LAB — CBC WITH DIFFERENTIAL (CANCER CENTER ONLY)
Abs Immature Granulocytes: 0 10*3/uL (ref 0.00–0.07)
Basophils Absolute: 0 10*3/uL (ref 0.0–0.1)
Basophils Relative: 1 %
Eosinophils Absolute: 0.2 10*3/uL (ref 0.0–0.5)
Eosinophils Relative: 5 %
HCT: 37.8 % (ref 36.0–46.0)
Hemoglobin: 12.3 g/dL (ref 12.0–15.0)
Immature Granulocytes: 0 %
Lymphocytes Relative: 35 %
Lymphs Abs: 1.3 10*3/uL (ref 0.7–4.0)
MCH: 30.6 pg (ref 26.0–34.0)
MCHC: 32.5 g/dL (ref 30.0–36.0)
MCV: 94 fL (ref 80.0–100.0)
Monocytes Absolute: 0.3 10*3/uL (ref 0.1–1.0)
Monocytes Relative: 9 %
Neutro Abs: 1.8 10*3/uL (ref 1.7–7.7)
Neutrophils Relative %: 50 %
Platelet Count: 278 10*3/uL (ref 150–400)
RBC: 4.02 MIL/uL (ref 3.87–5.11)
RDW: 14.7 % (ref 11.5–15.5)
WBC Count: 3.6 10*3/uL — ABNORMAL LOW (ref 4.0–10.5)
nRBC: 0 % (ref 0.0–0.2)

## 2023-08-30 MED ORDER — HEPARIN SOD (PORK) LOCK FLUSH 100 UNIT/ML IV SOLN
500.0000 [IU] | Freq: Once | INTRAVENOUS | Status: AC | PRN
  Administered 2023-08-30: 500 [IU]

## 2023-08-30 MED ORDER — FAM-TRASTUZUMAB DERUXTECAN-NXKI CHEMO 100 MG IV SOLR
3.2000 mg/kg | Freq: Once | INTRAVENOUS | Status: AC
  Administered 2023-08-30: 300 mg via INTRAVENOUS
  Filled 2023-08-30: qty 15

## 2023-08-30 MED ORDER — DIPHENHYDRAMINE HCL 25 MG PO CAPS
25.0000 mg | ORAL_CAPSULE | Freq: Once | ORAL | Status: AC
  Administered 2023-08-30: 25 mg via ORAL
  Filled 2023-08-30: qty 1

## 2023-08-30 MED ORDER — DEXTROSE 5 % IV SOLN: Freq: Once | INTRAVENOUS | Status: AC

## 2023-08-30 MED ORDER — SODIUM CHLORIDE 0.9% FLUSH
10.0000 mL | INTRAVENOUS | Status: DC | PRN
Start: 2023-08-30 — End: 2023-08-30

## 2023-08-30 MED ORDER — SODIUM CHLORIDE 0.9 % IV SOLN
25.0000 mg | Freq: Once | INTRAVENOUS | Status: AC
  Administered 2023-08-30: 25 mg via INTRAVENOUS
  Filled 2023-08-30: qty 1

## 2023-08-30 MED ORDER — DEXAMETHASONE SODIUM PHOSPHATE 10 MG/ML IJ SOLN
10.0000 mg | Freq: Once | INTRAMUSCULAR | Status: AC
  Administered 2023-08-30: 10 mg via INTRAVENOUS
  Filled 2023-08-30: qty 1

## 2023-08-30 MED ORDER — SODIUM CHLORIDE 0.9 % IV SOLN
150.0000 mg | Freq: Once | INTRAVENOUS | Status: AC
  Administered 2023-08-30: 150 mg via INTRAVENOUS
  Filled 2023-08-30: qty 150

## 2023-08-30 MED ORDER — SODIUM CHLORIDE 0.9% FLUSH
10.0000 mL | Freq: Once | INTRAVENOUS | Status: AC
  Administered 2023-08-30: 10 mL

## 2023-08-30 MED ORDER — ACETAMINOPHEN 325 MG PO TABS
650.0000 mg | ORAL_TABLET | Freq: Once | ORAL | Status: AC
  Administered 2023-08-30: 650 mg via ORAL
  Filled 2023-08-30: qty 2

## 2023-08-30 NOTE — Progress Notes (Signed)
OK to proceed with treatment today with out labwork today per Dr Pamelia Hoit.

## 2023-08-30 NOTE — Progress Notes (Signed)
Patient Care Team: Patient, [name] Krystal Croissant, MD as Medical Oncologist (Hematology and Oncology) [name], MD as Consulting Physician (Radiation Oncology) [name], NP as Nurse Practitioner (Hematology and Oncology) [name], MD as Consulting Physician (General Surgery) [name], RPH-CPP as Pharmacist (Hematology and Oncology)  DIAGNOSIS:  Encounter Diagnosis  Name Primary?   Malignant neoplasm of central portion of left breast in female, estrogen receptor positive (HCC) Yes    SUMMARY OF ONCOLOGIC HISTORY: Oncology History  Cancer of central portion of left female breast (HCC)  04/02/2014 Genetic Testing   BRCA1 [id number] pathogenic mutation identified on gene sequce and del/dup analyses of BRCA1 and BRCA2.  BRCA1 [id number] VUS identified as well.  The report date is 04/02/2014.  UPDATE: BRCA1 [id number] VUS has been reclassified as a likely benign variant.  The reclassification date is 04/21/2020.   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 N0 M0 stage IA: Oncotype DX 13 (ROR 9%)   04/16/2014 Surgery   Left upper back melanoma resected no residual melanoma identified on re-resection margins negative   05/26/2014 - 06/08/2015 Anti-estrogen oral therapy   Tamoxifen 20 mg daily with a plan to switch her to aromatase inhibitors once she gets oophorectomy ( patient did not continue antiestrogen therapy by choice)   08/13/2014 Surgery   oophorectomy with total abdominal hysterectomy   09/08/2015 -  Anti-estrogen oral therapy   Resumed anastrozole after she found recurrence of breast cancer; stopped in January 2018, started letrozole 2.5 mg daily 10/12/2016; switched to Exemestane 25 mg daily on 01/03/17   09/16/2015 Surgery   Skin, left:  IDC grade 3; 0.5 cm, margins negative, ER 95%, PR 10%, HER-2 positive ratio 2.08   11/08/2015 - 02/2017 Chemotherapy   Herceptin every 3 weeks plus anastrozole   11/24/2015 - 01/14/2016 Radiation Therapy   Adjuvant radiation therapy by Dr. Roselind Messier   01/2017 -  Anti-estrogen oral therapy   Exemestane daily (stopped others due to side effects), due to muscle aches and pains switch to tamoxifen 5 mg daily starting 12/18/2017   08/07/2019 Relapse/Recurrence   Bone scan: Sclerotic metastatic lesions involving left femoral neck, right ninth rib and entire L5 vertebra; MRI left hip: Diffuse signal abnormality L5 vertebral body with possible extraosseous extension   08/22/2019 - 09/03/2019 Radiation Therapy   Palliative radiation with Dr. Roselind Messier to L5 and left proixmal femur.    10/2021 Treatment Plan Change   Fulvestrant and Verzenio   08/23/2022 Imaging   CT CAP 08/23/2022: New and enlarged pulmonary nodule consistent with progression.  Enlarged small low cervical and mediastinal nodes.  Multiple bone metastases minimally progressive.  Liver lesions evaluation is challenging because of hepatic steatosis with suspected liver progression, isolated tiny omental nodule     09/07/2022 PET scan   IMPRESSION: 1. Worsening of disease now with nodal, pulmonary and hepatic metastases. 2. Areas of variable FDG uptake with some new bone lesions and some areas of bony metastasis which are more focal, for instance in the pelvis where there was likely diffuse involvement on prior imaging. Many of the areas of sclerosis do not show substantial uptake as outlined above and some areas show improvement/resolution of FDG uptake seen previously but there are signs of active bony metastatic disease currently. 3. Likely LEFT adrenal adenoma. 4. Severe  hepatic steatosis. 5. Aortic atherosclerosis.   Primary malignant neoplasm of breast with metastasis (HCC)  08/25/2019 Initial Diagnosis   Primary malignant neoplasm of  breast with metastasis (HCC)   10/11/2022 -  Chemotherapy   Patient is on Treatment Plan : BREAST METASTATIC Fam-Trastuzumab Deruxtecan-nxki (Enhertu) (5.4) q21d       CHIEF COMPLIANT: Cycle 15 Enhertu  HISTORY OF PRESENT ILLNESS: Krystal [name] is a 58 year old with above-mentioned metastatic breast cancer who is currently on Enhertu.  She is tolerating the treatment extremely well without any major problems or concerns.  Denies any peripheral neuropathy.  She reports that the hair is improving.  She has had intermittent aches and pains but nothing too concerning.  We have scheduled her for CT scan but she could not do it because of work-related issues.  She needs to be scheduled.    ALLERGIES:  is allergic to dilaudid [hydromorphone] and codeine.  MEDICATIONS:  Current Outpatient Medications  Medication Sig Dispense Refill   ALPRAZolam (XANAX) 0.25 MG tablet Take 1 tablet (0.25 mg total) by mouth at bedtime as needed for anxiety. 30 tablet 3   amoxicillin (AMOXIL) 500 MG tablet Take 1 tablet (500 mg total) by mouth 2 (two) times daily. 14 tablet 0   aspirin EC 81 MG tablet Take 1 tablet (81 mg total) by mouth daily. Swallow whole. 90 tablet 3   aspirin-acetaminophen-caffeine (EXCEDRIN MIGRAINE) 250-250-65 MG tablet Take 2 tablets by mouth every 6 (six) hours as needed for headache.     CALCIUM PO Take 1,200 mg by mouth 2 (two) times daily.     dexamethasone (DECADRON) 4 MG tablet Take 1 tablets (4 mg) by mouth daily for 3 days after chemotherapy. Take with food. 30 tablet 1   dicyclomine (BENTYL) 20 MG tablet Take 1 tablet (20 mg total) by mouth every 6 (six) hours as needed for up to 7 days for spasms. 28 tablet 0   esomeprazole (NEXIUM) 40 MG capsule Take 40 mg by mouth daily.     levofloxacin (LEVAQUIN) 750 MG tablet Take 1 tablet (750 mg total) by mouth daily. 7 tablet 0   lidocaine-prilocaine (EMLA) cream Apply to affected area once 30 g 3   loperamide (IMODIUM) 2 MG capsule Take 2 mg by  mouth daily as needed for diarrhea or loose stools. Take 2 tablets at first diarrhea and then 1 tablet after additional loose stool. Max number in 24 hours is 8 tablets.     loratadine (CLARITIN) 10 MG tablet Take 10 mg by mouth daily.     meloxicam (MOBIC) 15 MG tablet TAKE 1 TABLET(15 MG) BY MOUTH DAILY AS NEEDED FOR PAIN 30 tablet 1   ondansetron (ZOFRAN) 8 MG tablet Take 1 tablet (8 mg total) by mouth every 8 (eight) hours as needed for nausea or vomiting. Start on the third day after chemotherapy. 30 tablet 1   prochlorperazine (COMPAZINE) 10 MG tablet Take 1 tablet (10 mg total) by mouth every 6 (six) hours as needed for nausea or vomiting. 30 tablet 1   rosuvastatin (CRESTOR) 10 MG tablet Take 1 tablet (10 mg total) by mouth daily. 90 tablet 3   traMADol (ULTRAM) 50 MG tablet Take 1 tablet (50 mg total) by mouth every 6 (six) hours as needed. 10 tablet 0   traZODone (DESYREL) 50 MG tablet TAKE 1 TABLET(50 MG) BY MOUTH AT BEDTIME 90 tablet 3   venlafaxine XR (EFFEXOR-XR) 75 MG 24 hr capsule TAKE 1 CAPSULE(75 MG) BY MOUTH AT BEDTIME  90 capsule 3   No current facility-administered medications for this visit.   Facility-Administered Medications Ordered in Other Visits  Medication Dose Route Frequency Provider Last Rate Last Admin   fam-trastuzumab deruxtecan-nxki (ENHERTU) 300 mg in dextrose 5 % 100 mL chemo infusion  3.2 mg/kg (Treatment Plan Recorded) Intravenous Once Krystal Croissant, MD       heparin lock flush 100 unit/mL  500 Units Intracatheter Once PRN Krystal Croissant, MD       sodium chloride flush (NS) 0.9 % injection 10 mL  10 mL Intracatheter Once Krystal Croissant, MD       sodium chloride flush (NS) 0.9 % injection 10 mL  10 mL Intracatheter PRN Krystal Croissant, MD        PHYSICAL EXAMINATION: ECOG PERFORMANCE STATUS: 1 - Symptomatic but completely ambulatory    LABORATORY DATA:  I have reviewed the data as listed    Latest Ref Rng & Units 08/30/2023    3:13 PM 08/09/2023    1:36 PM  07/19/2023    1:53 PM  CMP  Glucose 70 - 99 mg/dL 295  621  308   BUN 6 - 20 mg/dL 11  7  8    Creatinine 0.44 - 1.00 mg/dL 6.57  8.46  9.62   Sodium 135 - 145 mmol/L 139  140  142   Potassium 3.5 - 5.1 mmol/L 4.1  4.0  3.7   Chloride 98 - 111 mmol/L 102  105  105   CO2 22 - 32 mmol/L 28  30  32   Calcium 8.9 - 10.3 mg/dL 9.1  9.4  8.9   Total Protein 6.5 - 8.1 g/dL 6.9  7.1  6.7   Total Bilirubin 0.0 - 1.2 mg/dL 0.4  0.3  0.3   Alkaline Phos 38 - 126 U/L 69  74  68   AST 15 - 41 U/L 31  27  27    ALT 0 - 44 U/L 43  33  29     Lab Results  Component Value Date   WBC 3.6 (L) 08/30/2023   HGB 12.3 08/30/2023   HCT 37.8 08/30/2023   MCV 94.0 08/30/2023   PLT 278 08/30/2023   NEUTROABS 1.8 08/30/2023    ASSESSMENT & PLAN:  Cancer of central portion of left female breast (HCC) 08/22/2019: PET CT scan: Metastatic disease in the chest with multiple lung nodules all subcentimeter size and hypermetabolic lymph nodes (right paratracheal node 7 mm, subcarinal node 1 cm, right hilar node 6 mm), retroperitoneal and upper pelvic common iliac lymph nodes, bone metastases especially L5, L4, T6, right 10th rib, left proximal femur   Bone biopsy was not felt to be reliable in addition to the effect of radiation on the bone. Palliative radiation completed 09/03/2019 [facility]: BRCA1 mutation, TMB 5.36, MSI: Normal   Prior treatment: Ibrance with letrozole and Xgeva for bone metastases switched to abemaciclib and Fulvestrant   Bone metastasis: Patient does not want to take bisphosphonates CT CAP 08/23/2022: New and enlarged pulmonary nodule consistent with progression.  Enlarged small low cervical and mediastinal nodes.  Multiple bone metastases minimally progressive.  Liver lesions evaluation is challenging because of hepatic steatosis with suspected liver progression, isolated tiny omental nodule   Ultrasound left supraclavicular biopsy 09/22/2022: Metastatic breast cancer ER 90%, PR 0%, Ki-67  60%, HER2 1+ Brain MRI: 10/03/2022: Small foci of enhancement in the right central sulcus and within an anterior right frontal sulcus concerning for leptomeningeal disease.  Skull metastases, abnormal  enhancement left globe  ---------------------------------------------------------------- Current treatment:: Enhertu cycle 15 Enhertu toxicities: Fatigue   CT CAP 03/12/2023: Improvement in pulmonary metastases.  No thoracic lymphadenopathy.  Similar hepatic lesions Brain MRI 04/16/2023: Unchanged leptomeningeal enhancement.   Patient needs CT scans and she had to cancel it because of work issues.  It needs to be rescheduled.   Return to clinic in 3 weeks for cycle 16      No orders of the defined types were placed in this encounter.  The patient has a good understanding of the overall plan. she agrees with it. she will call with any problems that may develop before the next visit here. Total time spent: 30 mins including face to face time and time spent for planning, charting and co-ordination of care   Tamsen Meek, MD 08/30/23

## 2023-08-30 NOTE — Patient Instructions (Signed)
 [facility] - A DEPT OF [facility]  Discharge Instructions: Thank you for choosing [facility] to provide your oncology and hematology care.   If you have a lab appointment with the Cancer Center, please go directly to the Cancer Center and check in at the registration area.   Wear comfortable clothing and clothing appropriate for easy access to any Portacath or PICC line.   We strive to give you quality time with your provider. You may need to reschedule your appointment if you arrive late (15 or more minutes).  Arriving late affects you and other patients whose appointments are after yours.  Also, if you miss three or more appointments without notifying the office, you may be dismissed from the clinic at the provider's discretion.      For prescription refill requests, have your pharmacy contact our office and allow 72 hours for refills to be completed.    Today you received the following chemotherapy and/or immunotherapy agents: Enhertu      To help prevent nausea and vomiting after your treatment, we encourage you to take your nausea medication as directed.  BELOW ARE SYMPTOMS THAT SHOULD BE REPORTED IMMEDIATELY: *FEVER GREATER THAN 100.4 F (38 C) OR HIGHER *CHILLS OR SWEATING *NAUSEA AND VOMITING THAT IS NOT CONTROLLED WITH YOUR NAUSEA MEDICATION *UNUSUAL SHORTNESS OF BREATH *UNUSUAL BRUISING OR BLEEDING *URINARY PROBLEMS (pain or burning when urinating, or frequent urination) *BOWEL PROBLEMS (unusual diarrhea, constipation, pain near the anus) TENDERNESS IN MOUTH AND THROAT WITH OR WITHOUT PRESENCE OF ULCERS (sore throat, sores in mouth, or a toothache) UNUSUAL RASH, SWELLING OR PAIN  UNUSUAL VAGINAL DISCHARGE OR ITCHING   Items with * indicate a potential emergency and should be followed up as soon as possible or go to the Emergency Department if any problems should occur.  Please show the CHEMOTHERAPY ALERT CARD or IMMUNOTHERAPY  ALERT CARD at check-in to the Emergency Department and triage nurse.  Should you have questions after your visit or need to cancel or reschedule your appointment, please contact [facility] - A DEPT OF [facility]  Dept: [phone]  and follow the prompts.  Office hours are 8:00 a.m. to 4:30 p.m. Monday - Friday. Please note that voicemails left after 4:00 p.m. may not be returned until the following business day.  We are closed weekends and major holidays. You have access to a nurse at all times for urgent questions. Please call the main number to the clinic Dept: [phone] and follow the prompts.   For any non-urgent questions, you may also contact your provider using MyChart. We now offer e-Visits for anyone 70 and older to request care online for non-urgent symptoms. For details visit [email or web address].   Also download the MyChart app! Go to the app store, search "MyChart", open the app, select [facility], and log in with your MyChart username and password.

## 2023-08-30 NOTE — Assessment & Plan Note (Signed)
08/22/2019: PET CT scan: Metastatic disease in the chest with multiple lung nodules all subcentimeter size and hypermetabolic lymph nodes (right paratracheal node 7 mm, subcarinal node 1 cm, right hilar node 6 mm), retroperitoneal and upper pelvic common iliac lymph nodes, bone metastases especially L5, L4, T6, right 10th rib, left proximal femur   Bone biopsy was not felt to be reliable in addition to the effect of radiation on the bone. Palliative radiation completed 09/03/2019 [facility]: BRCA1 mutation, TMB 5.36, MSI: Normal   Prior treatment: Ibrance with letrozole and Xgeva for bone metastases switched to abemaciclib and Fulvestrant   Bone metastasis: Patient does not want to take bisphosphonates CT CAP 08/23/2022: New and enlarged pulmonary nodule consistent with progression.  Enlarged small low cervical and mediastinal nodes.  Multiple bone metastases minimally progressive.  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  Skull metastases, abnormal enhancement left globe  ---------------------------------------------------------------- Current treatment:: Enhertu cycle 15 Enhertu toxicities: Fatigue   CT CAP 03/12/2023: Improvement in pulmonary metastases.  No thoracic lymphadenopathy.  Similar hepatic lesions Brain MRI 04/16/2023: Unchanged leptomeningeal enhancement.   Patient needs CT scans and she had to cancel it because of work issues.  It needs to be rescheduled.   Return to clinic in 3 weeks for cycle 16

## 2023-08-31 ENCOUNTER — Other Ambulatory Visit: Payer: Self-pay

## 2023-09-10 ENCOUNTER — Encounter: Payer: Self-pay | Admitting: *Deleted

## 2023-09-10 ENCOUNTER — Telehealth: Payer: Self-pay | Admitting: Radiation Therapy

## 2023-09-10 ENCOUNTER — Other Ambulatory Visit: Payer: Self-pay | Admitting: Radiation Therapy

## 2023-09-10 NOTE — Progress Notes (Signed)
Scheduling message sent to get patient added on after MRI for afternoon appt.

## 2023-09-10 NOTE — Telephone Encounter (Signed)
I spoke with Krystal [name] about her upcoming brain MRI scheduled 10/16/23. She is aware of this and plans to attend. I let her know that she will be receiving a call from Dr. Liana Gerold scheduling team for a follow-up to review that visit since she requires afternoon visits for her work schedule.   Krystal [name] R.T.(R)(T) Radiation Special Procedures Lead

## 2023-09-11 ENCOUNTER — Encounter: Payer: Self-pay | Admitting: *Deleted

## 2023-09-19 MED FILL — Fosaprepitant Dimeglumine For IV Infusion 150 MG (Base Eq): INTRAVENOUS | Qty: 5 | Status: AC

## 2023-09-20 ENCOUNTER — Ambulatory Visit: Payer: Commercial Managed Care - PPO | Admitting: Adult Health

## 2023-09-20 ENCOUNTER — Inpatient Hospital Stay: Payer: Commercial Managed Care - PPO | Attending: Hematology and Oncology

## 2023-09-20 ENCOUNTER — Inpatient Hospital Stay: Payer: Commercial Managed Care - PPO | Admitting: Hematology and Oncology

## 2023-09-20 ENCOUNTER — Other Ambulatory Visit: Payer: Commercial Managed Care - PPO

## 2023-09-20 ENCOUNTER — Ambulatory Visit: Payer: Commercial Managed Care - PPO

## 2023-09-20 ENCOUNTER — Inpatient Hospital Stay: Payer: Commercial Managed Care - PPO

## 2023-09-20 VITALS — BP 139/79 | HR 91 | Temp 97.9°F | Resp 17 | Wt 235.3 lb

## 2023-09-20 DIAGNOSIS — Z170 Estrogen receptor positive status [ER+]: Secondary | ICD-10-CM

## 2023-09-20 DIAGNOSIS — Z95828 Presence of other vascular implants and grafts: Secondary | ICD-10-CM

## 2023-09-20 DIAGNOSIS — T451X5A Adverse effect of antineoplastic and immunosuppressive drugs, initial encounter: Secondary | ICD-10-CM | POA: Insufficient documentation

## 2023-09-20 DIAGNOSIS — Z7982 Long term (current) use of aspirin: Secondary | ICD-10-CM | POA: Insufficient documentation

## 2023-09-20 DIAGNOSIS — Z79899 Other long term (current) drug therapy: Secondary | ICD-10-CM | POA: Insufficient documentation

## 2023-09-20 DIAGNOSIS — G620 Drug-induced polyneuropathy: Secondary | ICD-10-CM | POA: Diagnosis not present

## 2023-09-20 DIAGNOSIS — C7951 Secondary malignant neoplasm of bone: Secondary | ICD-10-CM

## 2023-09-20 DIAGNOSIS — Z1501 Genetic susceptibility to malignant neoplasm of breast: Secondary | ICD-10-CM | POA: Diagnosis not present

## 2023-09-20 DIAGNOSIS — C50112 Malignant neoplasm of central portion of left female breast: Secondary | ICD-10-CM | POA: Diagnosis not present

## 2023-09-20 DIAGNOSIS — Z923 Personal history of irradiation: Secondary | ICD-10-CM | POA: Diagnosis not present

## 2023-09-20 DIAGNOSIS — Z7952 Long term (current) use of systemic steroids: Secondary | ICD-10-CM | POA: Diagnosis not present

## 2023-09-20 DIAGNOSIS — R200 Anesthesia of skin: Secondary | ICD-10-CM | POA: Insufficient documentation

## 2023-09-20 DIAGNOSIS — Z9221 Personal history of antineoplastic chemotherapy: Secondary | ICD-10-CM | POA: Insufficient documentation

## 2023-09-20 DIAGNOSIS — G3184 Mild cognitive impairment, so stated: Secondary | ICD-10-CM | POA: Diagnosis not present

## 2023-09-20 DIAGNOSIS — Z79811 Long term (current) use of aromatase inhibitors: Secondary | ICD-10-CM | POA: Diagnosis not present

## 2023-09-20 DIAGNOSIS — C50919 Malignant neoplasm of unspecified site of unspecified female breast: Secondary | ICD-10-CM

## 2023-09-20 DIAGNOSIS — Z85820 Personal history of malignant melanoma of skin: Secondary | ICD-10-CM | POA: Insufficient documentation

## 2023-09-20 DIAGNOSIS — R202 Paresthesia of skin: Secondary | ICD-10-CM | POA: Diagnosis not present

## 2023-09-20 LAB — CBC WITH DIFFERENTIAL (CANCER CENTER ONLY)
Abs Immature Granulocytes: 0.01 10*3/uL (ref 0.00–0.07)
Basophils Absolute: 0 10*3/uL (ref 0.0–0.1)
Basophils Relative: 1 %
Eosinophils Absolute: 0.3 10*3/uL (ref 0.0–0.5)
Eosinophils Relative: 7 %
HCT: 35.9 % — ABNORMAL LOW (ref 36.0–46.0)
Hemoglobin: 11.9 g/dL — ABNORMAL LOW (ref 12.0–15.0)
Immature Granulocytes: 0 %
Lymphocytes Relative: 25 %
Lymphs Abs: 0.9 10*3/uL (ref 0.7–4.0)
MCH: 30.4 pg (ref 26.0–34.0)
MCHC: 33.1 g/dL (ref 30.0–36.0)
MCV: 91.6 fL (ref 80.0–100.0)
Monocytes Absolute: 0.3 10*3/uL (ref 0.1–1.0)
Monocytes Relative: 8 %
Neutro Abs: 2.2 10*3/uL (ref 1.7–7.7)
Neutrophils Relative %: 59 %
Platelet Count: 258 10*3/uL (ref 150–400)
RBC: 3.92 MIL/uL (ref 3.87–5.11)
RDW: 14.7 % (ref 11.5–15.5)
WBC Count: 3.8 10*3/uL — ABNORMAL LOW (ref 4.0–10.5)
nRBC: 0 % (ref 0.0–0.2)

## 2023-09-20 LAB — CMP (CANCER CENTER ONLY)
ALT: 26 U/L (ref 0–44)
AST: 20 U/L (ref 15–41)
Albumin: 3.9 g/dL (ref 3.5–5.0)
Alkaline Phosphatase: 73 U/L (ref 38–126)
Anion gap: 5 (ref 5–15)
BUN: 12 mg/dL (ref 6–20)
CO2: 31 mmol/L (ref 22–32)
Calcium: 8.9 mg/dL (ref 8.9–10.3)
Chloride: 104 mmol/L (ref 98–111)
Creatinine: 0.62 mg/dL (ref 0.44–1.00)
GFR, Estimated: >60 mL/min (ref >60)
Glucose, Bld: 207 mg/dL — ABNORMAL HIGH (ref 70–99)
Potassium: 4 mmol/L (ref 3.5–5.1)
Sodium: 140 mmol/L (ref 135–145)
Total Bilirubin: 0.3 mg/dL (ref 0.0–1.2)
Total Protein: 6.7 g/dL (ref 6.5–8.1)

## 2023-09-20 MED ORDER — HEPARIN SOD (PORK) LOCK FLUSH 100 UNIT/ML IV SOLN
500.0000 [IU] | Freq: Once | INTRAVENOUS | Status: AC | PRN
  Administered 2023-09-20: 500 [IU]

## 2023-09-20 MED ORDER — ACETAMINOPHEN 325 MG PO TABS
650.0000 mg | ORAL_TABLET | Freq: Once | ORAL | Status: AC
  Administered 2023-09-20: 650 mg via ORAL
  Filled 2023-09-20: qty 2

## 2023-09-20 MED ORDER — SODIUM CHLORIDE 0.9 % IV SOLN
150.0000 mg | Freq: Once | INTRAVENOUS | Status: AC
  Administered 2023-09-20: 150 mg via INTRAVENOUS
  Filled 2023-09-20: qty 150

## 2023-09-20 MED ORDER — DEXAMETHASONE SODIUM PHOSPHATE 10 MG/ML IJ SOLN
10.0000 mg | Freq: Once | INTRAMUSCULAR | Status: AC
  Administered 2023-09-20: 10 mg via INTRAVENOUS
  Filled 2023-09-20: qty 1

## 2023-09-20 MED ORDER — SODIUM CHLORIDE 0.9 % IV SOLN
25.0000 mg | Freq: Once | INTRAVENOUS | Status: AC
  Administered 2023-09-20: 25 mg via INTRAVENOUS
  Filled 2023-09-20: qty 1

## 2023-09-20 MED ORDER — DEXTROSE 5 % IV SOLN: Freq: Once | INTRAVENOUS | Status: AC

## 2023-09-20 MED ORDER — DIPHENHYDRAMINE HCL 25 MG PO CAPS
25.0000 mg | ORAL_CAPSULE | Freq: Once | ORAL | Status: AC
  Administered 2023-09-20: 25 mg via ORAL
  Filled 2023-09-20: qty 1

## 2023-09-20 MED ORDER — DEXTROSE 5 % IV SOLN
3.2000 mg/kg | Freq: Once | INTRAVENOUS | Status: AC
  Administered 2023-09-20: 300 mg via INTRAVENOUS
  Filled 2023-09-20: qty 15

## 2023-09-20 MED ORDER — SODIUM CHLORIDE 0.9% FLUSH
10.0000 mL | Freq: Once | INTRAVENOUS | Status: AC
  Administered 2023-09-20: 10 mL

## 2023-09-20 MED ORDER — SODIUM CHLORIDE 0.9% FLUSH
10.0000 mL | INTRAVENOUS | Status: DC | PRN
  Administered 2023-09-20: 10 mL

## 2023-09-20 NOTE — Patient Instructions (Signed)

## 2023-09-20 NOTE — Assessment & Plan Note (Signed)
08/22/2019: PET CT scan: Metastatic disease in the chest with multiple lung nodules all subcentimeter size and hypermetabolic lymph nodes (right paratracheal node 7 mm, subcarinal node 1 cm, right hilar node 6 mm), retroperitoneal and upper pelvic common iliac lymph nodes, bone metastases especially L5, L4, T6, right 10th rib, left proximal femur   Bone biopsy was not felt to be reliable in addition to the effect of radiation on the bone. Palliative radiation completed 09/03/2019 [facility]: BRCA1 mutation, TMB 5.36, MSI: Normal   Prior treatment: Ibrance with letrozole and Xgeva for bone metastases switched to abemaciclib and Fulvestrant   Bone metastasis: Patient does not want to take bisphosphonates CT CAP 08/23/2022: New and enlarged pulmonary nodule consistent with progression.  Enlarged small low cervical and mediastinal nodes.  Multiple bone metastases minimally progressive.  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  Skull metastases, abnormal enhancement left globe  ---------------------------------------------------------------- Current treatment:: Enhertu cycle 16 Enhertu toxicities: Fatigue   CT CAP 03/12/2023: Improvement in pulmonary metastases.  No thoracic lymphadenopathy.  Similar hepatic lesions Brain MRI 04/16/2023: Unchanged leptomeningeal enhancement.   Patient needs CT scans and she had to cancel it because of work issues.  It needs to be rescheduled. MRI brain scheduled for 10/16/2023   Return to clinic in 3 weeks for cycle 17

## 2023-09-20 NOTE — Progress Notes (Signed)
Patient Care Team: Patient, [name] Serena Croissant, MD as Medical Oncologist (Hematology and Oncology) [name], MD as Consulting Physician (Radiation Oncology) [name], NP as Nurse Practitioner (Hematology and Oncology) [name], MD as Consulting Physician (General Surgery) [name], RPH-CPP as Pharmacist (Hematology and Oncology)  DIAGNOSIS:  Encounter Diagnosis  Name Primary?   Malignant neoplasm of central portion of left breast in female, estrogen receptor positive (HCC) Yes    SUMMARY OF ONCOLOGIC HISTORY: Oncology History  Cancer of central portion of left female breast (HCC)  04/02/2014 Genetic Testing   BRCA1 [id number] pathogenic mutation identified on gene sequce and del/dup analyses of BRCA1 and BRCA2.  BRCA1 [id number] VUS identified as well.  The report date is 04/02/2014.  UPDATE: BRCA1 [id number] VUS has been reclassified as a likely benign variant.  The reclassification date is 04/21/2020.   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 N0 M0 stage IA: Oncotype DX 13 (ROR 9%)   04/16/2014 Surgery   Left upper back melanoma resected no residual melanoma identified on re-resection margins negative   05/26/2014 - 06/08/2015 Anti-estrogen oral therapy   Tamoxifen 20 mg daily with a plan to switch her to aromatase inhibitors once she gets oophorectomy ( patient did not continue antiestrogen therapy by choice)   08/13/2014 Surgery   oophorectomy with total abdominal hysterectomy   09/08/2015 -  Anti-estrogen oral therapy   Resumed anastrozole after she found recurrence of breast cancer; stopped in January 2018, started letrozole 2.5 mg daily 10/12/2016; switched to Exemestane 25 mg daily on 01/03/17   09/16/2015 Surgery   Skin, left:  IDC grade 3; 0.5 cm, margins negative, ER 95%, PR 10%, HER-2 positive ratio 2.08   11/08/2015 - 02/2017 Chemotherapy   Herceptin every 3 weeks plus anastrozole   11/24/2015 - 01/14/2016 Radiation Therapy   Adjuvant radiation therapy by Dr. Roselind Messier   01/2017 -  Anti-estrogen oral therapy   Exemestane daily (stopped others due to side effects), due to muscle aches and pains switch to tamoxifen 5 mg daily starting 12/18/2017   08/07/2019 Relapse/Recurrence   Bone scan: Sclerotic metastatic lesions involving left femoral neck, right ninth rib and entire L5 vertebra; MRI left hip: Diffuse signal abnormality L5 vertebral body with possible extraosseous extension   08/22/2019 - 09/03/2019 Radiation Therapy   Palliative radiation with Dr. Roselind Messier to L5 and left proixmal femur.    10/2021 Treatment Plan Change   Fulvestrant and Verzenio   08/23/2022 Imaging   CT CAP 08/23/2022: New and enlarged pulmonary nodule consistent with progression.  Enlarged small low cervical and mediastinal nodes.  Multiple bone metastases minimally progressive.  Liver lesions evaluation is challenging because of hepatic steatosis with suspected liver progression, isolated tiny omental nodule     09/07/2022 PET scan   IMPRESSION: 1. Worsening of disease now with nodal, pulmonary and hepatic metastases. 2. Areas of variable FDG uptake with some new bone lesions and some areas of bony metastasis which are more focal, for instance in the pelvis where there was likely diffuse involvement on prior imaging. Many of the areas of sclerosis do not show substantial uptake as outlined above and some areas show improvement/resolution of FDG uptake seen previously but there are signs of active bony metastatic disease currently. 3. Likely LEFT adrenal adenoma. 4. Severe  hepatic steatosis. 5. Aortic atherosclerosis.   Primary malignant neoplasm of breast with metastasis (HCC)  08/25/2019 Initial Diagnosis   Primary malignant neoplasm of  breast with metastasis (HCC)   10/11/2022 -  Chemotherapy   Patient is on Treatment Plan : BREAST METASTATIC Fam-Trastuzumab Deruxtecan-nxki (Enhertu) (5.4) q21d       CHIEF COMPLIANT: Cycle 16 Enhertu  HISTORY OF PRESENT ILLNESS:  History of Present Illness   Krystal [name] is a 58 year old female undergoing treatment for cancer who presents for follow-up on Enhertu.  She is undergoing treatment for cancer and is here for a follow-up visit. Her treatment is progressing well without any new problems. No significant stomach issues or nausea. An MRI of the brain is scheduled for February 11th, and a follow-up appointment is set for February 17th. A CT scan has not been rescheduled due to a previous work conflict.  She experiences memory issues, often referred to as 'chemo brain', characterized by difficulty remembering recent events while retaining long-term memories. She engages in cognitive games to maintain mental sharpness and notices a difference in cognitive function compared to before her treatment.  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 She uses a wrist support band at night to alleviate symptoms, as it sometimes disrupts her sleep.         ALLERGIES:  is allergic to dilaudid [hydromorphone] and codeine.  MEDICATIONS:  Current Outpatient Medications  Medication Sig Dispense Refill   ALPRAZolam (XANAX) 0.25 MG tablet Take 1 tablet (0.25 mg total) by mouth at bedtime as needed for anxiety. 30 tablet 3   amoxicillin (AMOXIL) 500 MG tablet Take 1 tablet (500 mg total) by mouth 2 (two) times daily. 14 tablet 0   aspirin EC 81 MG tablet Take 1 tablet (81 mg total) by mouth daily. Swallow whole. 90 tablet 3   aspirin-acetaminophen-caffeine (EXCEDRIN MIGRAINE) 250-250-65 MG tablet Take 2 tablets by mouth every 6 (six) hours as needed for headache.     CALCIUM PO Take 1,200 mg by  mouth 2 (two) times daily.     dexamethasone (DECADRON) 4 MG tablet Take 1 tablets (4 mg) by mouth daily for 3 days after chemotherapy. Take with food. 30 tablet 1   dicyclomine (BENTYL) 20 MG tablet Take 1 tablet (20 mg total) by mouth every 6 (six) hours as needed for up to 7 days for spasms. 28 tablet 0   esomeprazole (NEXIUM) 40 MG capsule Take 40 mg by mouth daily.     levofloxacin (LEVAQUIN) 750 MG tablet Take 1 tablet (750 mg total) by mouth daily. 7 tablet 0   lidocaine-prilocaine (EMLA) cream Apply to affected area once 30 g 3   loperamide (IMODIUM) 2 MG capsule Take 2 mg by mouth daily as needed for diarrhea or loose stools. Take 2 tablets at first diarrhea and then 1 tablet after additional loose stool. Max number in 24 hours is 8 tablets.     loratadine (CLARITIN) 10 MG tablet Take 10 mg by mouth daily.     meloxicam (MOBIC) 15 MG tablet TAKE 1 TABLET(15 MG) BY MOUTH DAILY AS NEEDED FOR PAIN 30 tablet 1   ondansetron (ZOFRAN) 8 MG tablet Take 1 tablet (8 mg total) by mouth every 8 (eight) hours as needed for nausea or vomiting. Start on the third day after chemotherapy. 30 tablet 1   prochlorperazine (COMPAZINE) 10 MG tablet Take 1 tablet (  10 mg total) by mouth every 6 (six) hours as needed for nausea or vomiting. 30 tablet 1   rosuvastatin (CRESTOR) 10 MG tablet Take 1 tablet (10 mg total) by mouth daily. 90 tablet 3   traMADol (ULTRAM) 50 MG tablet Take 1 tablet (50 mg total) by mouth every 6 (six) hours as needed. 10 tablet 0   traZODone (DESYREL) 50 MG tablet TAKE 1 TABLET(50 MG) BY MOUTH AT BEDTIME 90 tablet 3   venlafaxine XR (EFFEXOR-XR) 75 MG 24 hr capsule TAKE 1 CAPSULE(75 MG) BY MOUTH AT BEDTIME 90 capsule 3   No current facility-administered medications for this visit.    PHYSICAL EXAMINATION: ECOG PERFORMANCE STATUS: 1 - Symptomatic but completely ambulatory  Vitals:   09/20/23 1522  BP: 139/79  Pulse: 91  Resp: 17  Temp: 97.9 F (36.6 C)  SpO2: 99%   Filed  Weights   09/20/23 1522  Weight: 235 lb 4.8 oz (106.7 kg)     LABORATORY DATA:  I have reviewed the data as listed    Latest Ref Rng & Units 09/20/2023    3:00 PM 08/30/2023    3:13 PM 08/09/2023    1:36 PM  CMP  Glucose 70 - 99 mg/dL 696  295  284   BUN 6 - 20 mg/dL 12  11  7    Creatinine 0.44 - 1.00 mg/dL 1.32  4.40  1.02   Sodium 135 - 145 mmol/L 140  139  140   Potassium 3.5 - 5.1 mmol/L 4.0  4.1  4.0   Chloride 98 - 111 mmol/L 104  102  105   CO2 22 - 32 mmol/L 31  28  30    Calcium 8.9 - 10.3 mg/dL 8.9  9.1  9.4   Total Protein 6.5 - 8.1 g/dL 6.7  6.9  7.1   Total Bilirubin 0.0 - 1.2 mg/dL 0.3  0.4  0.3   Alkaline Phos 38 - 126 U/L 73  69  74   AST 15 - 41 U/L 20  31  27    ALT 0 - 44 U/L 26  43  33     Lab Results  Component Value Date   WBC 3.8 (L) 09/20/2023   HGB 11.9 (L) 09/20/2023   HCT 35.9 (L) 09/20/2023   MCV 91.6 09/20/2023   PLT 258 09/20/2023   NEUTROABS 2.2 09/20/2023    ASSESSMENT & PLAN:  Cancer of central portion of left female breast (HCC) 08/22/2019: PET CT scan: Metastatic disease in the chest with multiple lung nodules all subcentimeter size and hypermetabolic lymph nodes (right paratracheal node 7 mm, subcarinal node 1 cm, right hilar node 6 mm), retroperitoneal and upper pelvic common iliac lymph nodes, bone metastases especially L5, L4, T6, right 10th rib, left proximal femur   Bone biopsy was not felt to be reliable in addition to the effect of radiation on the bone. Palliative radiation completed 09/03/2019 [facility]: BRCA1 mutation, TMB 5.36, MSI: Normal   Prior treatment: Ibrance with letrozole and Xgeva for bone metastases switched to abemaciclib and Fulvestrant   Bone metastasis: Patient does not want to take bisphosphonates CT CAP 08/23/2022: New and enlarged pulmonary nodule consistent with progression.  Enlarged small low cervical and mediastinal nodes.  Multiple bone metastases minimally progressive.  Liver lesions evaluation is  challenging because of hepatic steatosis with suspected liver progression, isolated tiny omental nodule   Ultrasound left supraclavicular biopsy 09/22/2022: Metastatic breast cancer ER 90%, PR 0%, Ki-67 60%, HER2 1+ Brain  MRI: 10/03/2022: Small foci of enhancement in the right central sulcus and within an anterior right frontal sulcus concerning for leptomeningeal disease.  Skull metastases, abnormal enhancement left globe  ---------------------------------------------------------------- Current treatment:: Enhertu cycle 16 Enhertu toxicities: Fatigue   CT CAP 03/12/2023: Improvement in pulmonary metastases.  No thoracic lymphadenopathy.  Similar hepatic lesions Brain MRI 04/16/2023: Unchanged leptomeningeal enhancement.   Patient needs CT scans and she had to cancel it because of work issues.  It needs to be rescheduled. MRI brain scheduled for 10/16/2023   Return to clinic in 3 weeks for cycle 17    Chemotherapy-Induced Cognitive Impairment (Chemo Brain)   Experiencing memory issues, particularly with recent events, attributed to chemotherapy. Describes difficulty recalling recent conversations and tasks, although she can remember older information. Acknowledged that chemo brain is a recognized phenomenon. Ongoing clinical trials aim to improve memory function using game theory, but she is ineligible as they require participants to be off treatment.    Peripheral Neuropathy   Reports numbness and tingling in her hands, attributed to prolonged typing and work-related activities. Wears a band at night to alleviate symptoms, which sometimes wake her up. No immediate concern expressed but symptoms acknowledged.    General Health Maintenance   Lab results within acceptable ranges: WBC 3.8, hemoglobin 11.9, and normal platelets. Well-managed condition to continue current treatment regimen.          No orders of the defined types were placed in this encounter.  The patient has a good  understanding of the overall plan. she agrees with it. she will call with any problems that may develop before the next visit here. Total time spent: 30 mins including face to face time and time spent for planning, charting and co-ordination of care   Tamsen Meek, MD 09/20/23

## 2023-09-24 ENCOUNTER — Other Ambulatory Visit: Payer: Self-pay

## 2023-09-25 ENCOUNTER — Telehealth: Payer: Self-pay | Admitting: *Deleted

## 2023-09-25 NOTE — Telephone Encounter (Signed)
Per MD request, RN scheduled CT CAP prior to next MD f/u visit.  RN attempt x1 to contact pt with appt details. No answer.  LVM with appt details for pt.

## 2023-10-05 ENCOUNTER — Ambulatory Visit (HOSPITAL_COMMUNITY)
Admission: RE | Admit: 2023-10-05 | Discharge: 2023-10-05 | Disposition: A | Discharge status: Home / Self Care | Payer: Commercial Managed Care - PPO | Source: Ambulatory Visit | Attending: Hematology and Oncology | Admitting: Hematology and Oncology

## 2023-10-05 DIAGNOSIS — Z170 Estrogen receptor positive status [ER+]: Secondary | ICD-10-CM | POA: Diagnosis present

## 2023-10-05 DIAGNOSIS — C50919 Malignant neoplasm of unspecified site of unspecified female breast: Secondary | ICD-10-CM | POA: Diagnosis present

## 2023-10-05 DIAGNOSIS — C50112 Malignant neoplasm of central portion of left female breast: Secondary | ICD-10-CM | POA: Insufficient documentation

## 2023-10-05 MED ORDER — IOHEXOL 300 MG/ML  SOLN
100.0000 mL | Freq: Once | INTRAMUSCULAR | Status: AC | PRN
  Administered 2023-10-05: 100 mL via INTRAVENOUS

## 2023-10-08 ENCOUNTER — Other Ambulatory Visit: Payer: Self-pay | Admitting: Hematology and Oncology

## 2023-10-10 MED FILL — Fosaprepitant Dimeglumine For IV Infusion 150 MG (Base Eq): INTRAVENOUS | Qty: 5 | Status: AC

## 2023-10-11 ENCOUNTER — Ambulatory Visit: Payer: Commercial Managed Care - PPO | Admitting: Nurse Practitioner

## 2023-10-11 ENCOUNTER — Other Ambulatory Visit: Payer: Commercial Managed Care - PPO

## 2023-10-11 ENCOUNTER — Ambulatory Visit: Payer: Commercial Managed Care - PPO

## 2023-10-11 ENCOUNTER — Inpatient Hospital Stay: Payer: Commercial Managed Care - PPO | Attending: Hematology and Oncology

## 2023-10-11 ENCOUNTER — Inpatient Hospital Stay: Payer: Commercial Managed Care - PPO | Admitting: Hematology and Oncology

## 2023-10-11 ENCOUNTER — Other Ambulatory Visit: Payer: Self-pay | Admitting: *Deleted

## 2023-10-11 ENCOUNTER — Inpatient Hospital Stay: Payer: Commercial Managed Care - PPO

## 2023-10-11 VITALS — BP 126/76 | HR 81 | Temp 98.1°F | Resp 18 | Ht 64.0 in | Wt 231.7 lb

## 2023-10-11 DIAGNOSIS — C7951 Secondary malignant neoplasm of bone: Secondary | ICD-10-CM | POA: Diagnosis not present

## 2023-10-11 DIAGNOSIS — Z85820 Personal history of malignant melanoma of skin: Secondary | ICD-10-CM | POA: Diagnosis not present

## 2023-10-11 DIAGNOSIS — Z5112 Encounter for antineoplastic immunotherapy: Secondary | ICD-10-CM | POA: Diagnosis not present

## 2023-10-11 DIAGNOSIS — Z79899 Other long term (current) drug therapy: Secondary | ICD-10-CM | POA: Insufficient documentation

## 2023-10-11 DIAGNOSIS — Z170 Estrogen receptor positive status [ER+]: Secondary | ICD-10-CM

## 2023-10-11 DIAGNOSIS — I700 Atherosclerosis of aorta: Secondary | ICD-10-CM | POA: Diagnosis not present

## 2023-10-11 DIAGNOSIS — C787 Secondary malignant neoplasm of liver and intrahepatic bile duct: Secondary | ICD-10-CM | POA: Diagnosis not present

## 2023-10-11 DIAGNOSIS — Z9013 Acquired absence of bilateral breasts and nipples: Secondary | ICD-10-CM | POA: Insufficient documentation

## 2023-10-11 DIAGNOSIS — Z7982 Long term (current) use of aspirin: Secondary | ICD-10-CM | POA: Diagnosis not present

## 2023-10-11 DIAGNOSIS — Z1501 Genetic susceptibility to malignant neoplasm of breast: Secondary | ICD-10-CM | POA: Insufficient documentation

## 2023-10-11 DIAGNOSIS — Z79811 Long term (current) use of aromatase inhibitors: Secondary | ICD-10-CM | POA: Insufficient documentation

## 2023-10-11 DIAGNOSIS — R200 Anesthesia of skin: Secondary | ICD-10-CM | POA: Insufficient documentation

## 2023-10-11 DIAGNOSIS — R110 Nausea: Secondary | ICD-10-CM | POA: Insufficient documentation

## 2023-10-11 DIAGNOSIS — C50112 Malignant neoplasm of central portion of left female breast: Secondary | ICD-10-CM | POA: Diagnosis present

## 2023-10-11 DIAGNOSIS — C50919 Malignant neoplasm of unspecified site of unspecified female breast: Secondary | ICD-10-CM

## 2023-10-11 DIAGNOSIS — Z923 Personal history of irradiation: Secondary | ICD-10-CM | POA: Diagnosis not present

## 2023-10-11 DIAGNOSIS — C7931 Secondary malignant neoplasm of brain: Secondary | ICD-10-CM | POA: Diagnosis not present

## 2023-10-11 DIAGNOSIS — K760 Fatty (change of) liver, not elsewhere classified: Secondary | ICD-10-CM | POA: Diagnosis not present

## 2023-10-11 DIAGNOSIS — T451X5A Adverse effect of antineoplastic and immunosuppressive drugs, initial encounter: Secondary | ICD-10-CM | POA: Diagnosis not present

## 2023-10-11 DIAGNOSIS — Z95828 Presence of other vascular implants and grafts: Secondary | ICD-10-CM

## 2023-10-11 LAB — CBC WITH DIFFERENTIAL (CANCER CENTER ONLY)
Abs Immature Granulocytes: 0.01 10*3/uL (ref 0.00–0.07)
Basophils Absolute: 0 10*3/uL (ref 0.0–0.1)
Basophils Relative: 1 %
Eosinophils Absolute: 0.3 10*3/uL (ref 0.0–0.5)
Eosinophils Relative: 8 %
HCT: 37.4 % (ref 36.0–46.0)
Hemoglobin: 12.3 g/dL (ref 12.0–15.0)
Immature Granulocytes: 0 %
Lymphocytes Relative: 31 %
Lymphs Abs: 1.1 10*3/uL (ref 0.7–4.0)
MCH: 30.5 pg (ref 26.0–34.0)
MCHC: 32.9 g/dL (ref 30.0–36.0)
MCV: 92.8 fL (ref 80.0–100.0)
Monocytes Absolute: 0.4 10*3/uL (ref 0.1–1.0)
Monocytes Relative: 11 %
Neutro Abs: 1.7 10*3/uL (ref 1.7–7.7)
Neutrophils Relative %: 49 %
Platelet Count: 249 10*3/uL (ref 150–400)
RBC: 4.03 MIL/uL (ref 3.87–5.11)
RDW: 14.7 % (ref 11.5–15.5)
WBC Count: 3.5 10*3/uL — ABNORMAL LOW (ref 4.0–10.5)
nRBC: 0 % (ref 0.0–0.2)

## 2023-10-11 LAB — CMP (CANCER CENTER ONLY)
ALT: 28 U/L (ref 0–44)
AST: 24 U/L (ref 15–41)
Albumin: 3.5 g/dL (ref 3.5–5.0)
Alkaline Phosphatase: 63 U/L (ref 38–126)
Anion gap: 5 (ref 5–15)
BUN: 7 mg/dL (ref 6–20)
CO2: 26 mmol/L (ref 22–32)
Calcium: 7 mg/dL — ABNORMAL LOW (ref 8.9–10.3)
Chloride: 112 mmol/L — ABNORMAL HIGH (ref 98–111)
Creatinine: 0.47 mg/dL (ref 0.44–1.00)
GFR, Estimated: >60 mL/min (ref >60)
Glucose, Bld: 103 mg/dL — ABNORMAL HIGH (ref 70–99)
Potassium: 3.4 mmol/L — ABNORMAL LOW (ref 3.5–5.1)
Sodium: 143 mmol/L (ref 135–145)
Total Bilirubin: 0.3 mg/dL (ref 0.0–1.2)
Total Protein: 5.7 g/dL — ABNORMAL LOW (ref 6.5–8.1)

## 2023-10-11 MED ORDER — SODIUM CHLORIDE 0.9% FLUSH
10.0000 mL | Freq: Once | INTRAVENOUS | Status: AC
  Administered 2023-10-11: 10 mL

## 2023-10-11 MED ORDER — SODIUM CHLORIDE 0.9 % IV SOLN
25.0000 mg | Freq: Once | INTRAVENOUS | Status: AC
  Administered 2023-10-11: 25 mg via INTRAVENOUS
  Filled 2023-10-11: qty 1

## 2023-10-11 MED ORDER — DEXAMETHASONE SODIUM PHOSPHATE 10 MG/ML IJ SOLN
10.0000 mg | Freq: Once | INTRAMUSCULAR | Status: AC
  Administered 2023-10-11: 10 mg via INTRAVENOUS
  Filled 2023-10-11: qty 1

## 2023-10-11 MED ORDER — SODIUM CHLORIDE 0.9 % IV SOLN
150.0000 mg | Freq: Once | INTRAVENOUS | Status: AC
  Administered 2023-10-11: 150 mg via INTRAVENOUS
  Filled 2023-10-11: qty 150

## 2023-10-11 MED ORDER — DEXTROSE 5 % IV SOLN: Freq: Once | INTRAVENOUS | Status: AC

## 2023-10-11 MED ORDER — SODIUM CHLORIDE 0.9% FLUSH
10.0000 mL | INTRAVENOUS | Status: DC | PRN
  Administered 2023-10-11: 10 mL

## 2023-10-11 MED ORDER — HEPARIN SOD (PORK) LOCK FLUSH 100 UNIT/ML IV SOLN
500.0000 [IU] | Freq: Once | INTRAVENOUS | Status: AC | PRN
Start: 2023-10-11 — End: 2023-10-11
  Administered 2023-10-11: 500 [IU]

## 2023-10-11 MED ORDER — DIPHENHYDRAMINE HCL 25 MG PO CAPS
25.0000 mg | ORAL_CAPSULE | Freq: Once | ORAL | Status: AC
  Administered 2023-10-11: 25 mg via ORAL
  Filled 2023-10-11: qty 1

## 2023-10-11 MED ORDER — FAM-TRASTUZUMAB DERUXTECAN-NXKI CHEMO 100 MG IV SOLR
3.2000 mg/kg | Freq: Once | INTRAVENOUS | Status: AC
  Administered 2023-10-11: 300 mg via INTRAVENOUS
  Filled 2023-10-11: qty 15

## 2023-10-11 MED ORDER — ACETAMINOPHEN 325 MG PO TABS
650.0000 mg | ORAL_TABLET | Freq: Once | ORAL | Status: AC
  Administered 2023-10-11: 650 mg via ORAL
  Filled 2023-10-11: qty 2

## 2023-10-11 NOTE — Patient Instructions (Signed)

## 2023-10-11 NOTE — Assessment & Plan Note (Signed)
 08/22/2019: PET CT scan: Metastatic disease in the chest with multiple lung nodules all subcentimeter size and hypermetabolic lymph nodes (right paratracheal node 7 mm, subcarinal node 1 cm, right hilar node 6 mm), retroperitoneal and upper pelvic common iliac lymph nodes, bone metastases especially L5, L4, T6, right 10th rib, left proximal femur   Bone biopsy was not felt to be reliable in addition to the effect of radiation on the bone. Palliative radiation completed 09/03/2019 [facility]: BRCA1 mutation, TMB 5.36, MSI: Normal   Prior treatment: Ibrance with letrozole and Xgeva for bone metastases switched to abemaciclib and Fulvestrant   Bone metastasis: Patient does not want to take bisphosphonates CT CAP 08/23/2022: New and enlarged pulmonary nodule consistent with progression.  Enlarged small low cervical and mediastinal nodes.  Multiple bone metastases minimally progressive.  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  Skull metastases, abnormal enhancement left globe  ---------------------------------------------------------------- Current treatment:: Enhertu cycle 17 Enhertu toxicities: Fatigue   CT CAP 03/12/2023: Improvement in pulmonary metastases.  No thoracic lymphadenopathy.  Similar hepatic lesions Brain MRI 04/16/2023: Unchanged leptomeningeal enhancement.   Patient needs CT scans and she had to cancel it because of work issues.  It needs to be rescheduled. MRI brain scheduled for 10/16/2023   Return to clinic in 3 weeks for cycle 18

## 2023-10-11 NOTE — Progress Notes (Signed)
 Patient Care Team: Patient, [name] Serena Croissant, MD as Medical Oncologist (Hematology and Oncology) [name], MD as Consulting Physician (Radiation Oncology) [name], NP as Nurse Practitioner (Hematology and Oncology) [name], MD as Consulting Physician (General Surgery) [name], RPH-CPP as Pharmacist (Hematology and Oncology)  DIAGNOSIS:  Encounter Diagnosis  Name Primary?   Malignant neoplasm of central portion of left breast in female, estrogen receptor positive (HCC) Yes    SUMMARY OF ONCOLOGIC HISTORY: Oncology History  Cancer of central portion of left female breast (HCC)  04/02/2014 Genetic Testing   BRCA1 [id number] pathogenic mutation identified on gene sequce and del/dup analyses of BRCA1 and BRCA2.  BRCA1 p.[id number] VUS identified as well.  The report date is 04/02/2014.  UPDATE: BRCA1 X.L2440N VUS has been reclassified as a likely benign variant.  The reclassification date is 04/21/2020.   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 N0 M0 stage IA: Oncotype DX 13 (ROR 9%)   04/16/2014 Surgery   Left upper back melanoma resected no residual melanoma identified on re-resection margins negative   05/26/2014 - 06/08/2015 Anti-estrogen oral therapy   Tamoxifen 20 mg daily with a plan to switch her to aromatase inhibitors once she gets oophorectomy ( patient did not continue antiestrogen therapy by choice)   08/13/2014 Surgery   oophorectomy with total abdominal hysterectomy   09/08/2015 -  Anti-estrogen oral therapy   Resumed anastrozole after she found recurrence of breast cancer; stopped in January 2018, started letrozole 2.5 mg daily 10/12/2016; switched to Exemestane 25 mg daily on 01/03/17   09/16/2015 Surgery   Skin, left:  IDC grade 3; 0.5 cm, margins negative, ER 95%, PR 10%, HER-2 positive ratio 2.08   11/08/2015 - 02/2017 Chemotherapy   Herceptin every 3 weeks plus anastrozole   11/24/2015 - 01/14/2016 Radiation Therapy   Adjuvant radiation therapy by Dr. Roselind Messier   01/2017 -  Anti-estrogen oral therapy   Exemestane daily (stopped others due to side effects), due to muscle aches and pains switch to tamoxifen 5 mg daily starting 12/18/2017   08/07/2019 Relapse/Recurrence   Bone scan: Sclerotic metastatic lesions involving left femoral neck, right ninth rib and entire L5 vertebra; MRI left hip: Diffuse signal abnormality L5 vertebral body with possible extraosseous extension   08/22/2019 - 09/03/2019 Radiation Therapy   Palliative radiation with Dr. Roselind Messier to L5 and left proixmal femur.    10/2021 Treatment Plan Change   Fulvestrant and Verzenio   08/23/2022 Imaging   CT CAP 08/23/2022: New and enlarged pulmonary nodule consistent with progression.  Enlarged small low cervical and mediastinal nodes.  Multiple bone metastases minimally progressive.  Liver lesions evaluation is challenging because of hepatic steatosis with suspected liver progression, isolated tiny omental nodule     09/07/2022 PET scan   IMPRESSION: 1. Worsening of disease now with nodal, pulmonary and hepatic metastases. 2. Areas of variable FDG uptake with some new bone lesions and some areas of bony metastasis which are more focal, for instance in the pelvis where there was likely diffuse involvement on prior imaging. Many of the areas of sclerosis do not show substantial uptake as outlined above and some areas show improvement/resolution of FDG uptake seen previously but there are signs of active bony metastatic disease currently. 3. Likely LEFT adrenal adenoma. 4. Severe  hepatic steatosis. 5. Aortic atherosclerosis.   Primary malignant neoplasm of breast with metastasis (HCC)  08/25/2019 Initial Diagnosis   Primary malignant neoplasm of  breast with metastasis (HCC)   10/11/2022 -  Chemotherapy   Patient is on Treatment Plan : BREAST METASTATIC Fam-Trastuzumab Deruxtecan-nxki (Enhertu) (5.4) q21d       CHIEF COMPLIANT: Enhertu  HISTORY OF PRESENT ILLNESS:   History of Present Illness The patient, with a history of cancer, presents for a follow-up visit. She reports experiencing nausea despite taking anti-nausea medication. The patient also mentions experiencing anxiety, which she attributes to personal stressors. She has noticed an increase in facial hair, which she finds bothersome. The patient also mentions a potential issue with her eye, noting a visible blood vessel and expressing concern about the strength of her vision. She has been wearing blue light blocking glasses and has contacted a retinal specialist for further evaluation. The patient also mentions a referral to an ENT, but does not provide further details on this issue.     ALLERGIES:  is allergic to dilaudid [hydromorphone] and codeine.  MEDICATIONS:  Current Outpatient Medications  Medication Sig Dispense Refill   ALPRAZolam (XANAX) 0.25 MG tablet Take 1 tablet (0.25 mg total) by mouth at bedtime as needed for anxiety. 30 tablet 3   aspirin EC 81 MG tablet Take 1 tablet (81 mg total) by mouth daily. Swallow whole. 90 tablet 3   aspirin-acetaminophen-caffeine (EXCEDRIN MIGRAINE) 250-250-65 MG tablet Take 2 tablets by mouth every 6 (six) hours as needed for headache.     CALCIUM PO Take 1,200 mg by mouth 2 (two) times daily.     dexamethasone (DECADRON) 4 MG tablet Take 1 tablets (4 mg) by mouth daily for 3 days after chemotherapy. Take with food. 30 tablet 1   esomeprazole (NEXIUM) 40 MG capsule Take 40 mg by mouth daily.     levofloxacin (LEVAQUIN) 750 MG tablet Take 1 tablet (750 mg total) by mouth daily. 7 tablet 0   lidocaine-prilocaine (EMLA) cream Apply to affected area once 30 g 3   loperamide (IMODIUM) 2 MG capsule Take 2 mg by mouth daily as needed  for diarrhea or loose stools. Take 2 tablets at first diarrhea and then 1 tablet after additional loose stool. Max number in 24 hours is 8 tablets.     loratadine (CLARITIN) 10 MG tablet Take 10 mg by mouth daily.     meloxicam (MOBIC) 15 MG tablet TAKE 1 TABLET(15 MG) BY MOUTH DAILY AS NEEDED FOR PAIN 30 tablet 1   traMADol (ULTRAM) 50 MG tablet Take 1 tablet (50 mg total) by mouth every 6 (six) hours as needed. 10 tablet 0   traZODone (DESYREL) 50 MG tablet TAKE 1 TABLET(50 MG) BY MOUTH AT BEDTIME 90 tablet 3   venlafaxine XR (EFFEXOR-XR) 75 MG 24 hr capsule TAKE 1 CAPSULE(75 MG) BY MOUTH AT BEDTIME 90 capsule 3   dicyclomine (BENTYL) 20 MG tablet Take 1 tablet (20 mg total) by mouth every 6 (six) hours as needed for up to 7 days for spasms. 28 tablet 0   ondansetron (ZOFRAN) 8 MG tablet Take 1 tablet (8 mg total) by mouth every 8 (eight) hours as needed for nausea or vomiting. Start on the third day after chemotherapy. (Patient not taking: Reported on 10/11/2023) 30 tablet 1   prochlorperazine (COMPAZINE) 10 MG tablet Take 1 tablet (10 mg total) by mouth every 6 (six) hours as needed for nausea or vomiting. (Patient not taking: Reported on  10/11/2023) 30 tablet 1   rosuvastatin (CRESTOR) 10 MG tablet Take 1 tablet (10 mg total) by mouth daily. (Patient not taking: Reported on 10/11/2023) 90 tablet 3   No current facility-administered medications for this visit.    PHYSICAL EXAMINATION: ECOG PERFORMANCE STATUS: 1 - Symptomatic but completely ambulatory  Vitals:   10/11/23 1452  BP: 126/76  Pulse: 81  Resp: 18  Temp: 98.1 F (36.7 C)  SpO2: 97%   Filed Weights   10/11/23 1452  Weight: 231 lb 11.2 oz (105.1 kg)    Physical Exam   (exam performed in the presence of a chaperone)  LABORATORY DATA:  I have reviewed the data as listed    Latest Ref Rng & Units 09/20/2023    3:00 PM 08/30/2023    3:13 PM 08/09/2023    1:36 PM  CMP  Glucose 70 - 99 mg/dL 119  147  829   BUN 6 - 20 mg/dL 12   11  7    Creatinine 0.44 - 1.00 mg/dL 5.62  1.30  8.65   Sodium 135 - 145 mmol/L 140  139  140   Potassium 3.5 - 5.1 mmol/L 4.0  4.1  4.0   Chloride 98 - 111 mmol/L 104  102  105   CO2 22 - 32 mmol/L 31  28  30    Calcium 8.9 - 10.3 mg/dL 8.9  9.1  9.4   Total Protein 6.5 - 8.1 g/dL 6.7  6.9  7.1   Total Bilirubin 0.0 - 1.2 mg/dL 0.3  0.4  0.3   Alkaline Phos 38 - 126 U/L 73  69  74   AST 15 - 41 U/L 20  31  27    ALT 0 - 44 U/L 26  43  33     Lab Results  Component Value Date   WBC 3.8 (L) 09/20/2023   HGB 11.9 (L) 09/20/2023   HCT 35.9 (L) 09/20/2023   MCV 91.6 09/20/2023   PLT 258 09/20/2023   NEUTROABS 2.2 09/20/2023    ASSESSMENT & PLAN:  Cancer of central portion of left female breast (HCC) 08/22/2019: PET CT scan: Metastatic disease in the chest with multiple lung nodules all subcentimeter size and hypermetabolic lymph nodes (right paratracheal node 7 mm, subcarinal node 1 cm, right hilar node 6 mm), retroperitoneal and upper pelvic common iliac lymph nodes, bone metastases especially L5, L4, T6, right 10th rib, left proximal femur   Bone biopsy was not felt to be reliable in addition to the effect of radiation on the bone. Palliative radiation completed 09/03/2019 [facility]: BRCA1 mutation, TMB 5.36, MSI: Normal   Prior treatment: Ibrance with letrozole and Xgeva for bone metastases switched to abemaciclib and Fulvestrant   Bone metastasis: Patient does not want to take bisphosphonates CT CAP 08/23/2022: New and enlarged pulmonary nodule consistent with progression.  Enlarged small low cervical and mediastinal nodes.  Multiple bone metastases minimally progressive.  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  Skull metastases, abnormal enhancement left globe  ---------------------------------------------------------------- Current treatment:: Enhertu cycle 17 Enhertu toxicities: Fatigue   CT CAP 03/12/2023: Improvement in pulmonary metastases.  No thoracic lymphadenopathy.  Similar hepatic lesions Brain MRI 04/16/2023:  Unchanged leptomeningeal enhancement.   CT CAP 10/11/2023: Continued decrease in the pulmonary metastases, no significant change in the liver lesions, unchanged bone metastases MRI brain scheduled for 10/16/2023   Return to clinic in 3 weeks for cycle 18 and after that we can do every 4-week Enhertu treatments. ------------------------------------- Assessment and Plan Assessment & Plan Metastatic Liver Lesions Liver lesions reduced in size, indicating positive treatment response. Awaiting official imaging report. - Await official imaging report. - Continue current treatment regimen.  Chemotherapy-Induced Nausea Nausea persists despite Phenergan, effective but not completely. - Continue Phenergan for nausea management.  Anxiety Increased anxiety with palpitations and shaking due to stress from living conditions. - Encourage stress-reducing strategies. - Consider referral to mental health support if symptoms persist.  Follow-up Upcoming appointments and tests scheduled. - MRI scheduled for March 11. - Follow-up appointment on March 27.      No orders of the defined types were placed in this encounter.  The patient has a good understanding of the overall plan. she agrees with it. she will call with any problems that may develop before the next visit here. Total time spent: 30 mins including face to face time and time spent for planning, charting and co-ordination of care   Tamsen Meek, MD 10/11/23

## 2023-10-12 ENCOUNTER — Telehealth: Payer: Self-pay | Admitting: *Deleted

## 2023-10-12 NOTE — Telephone Encounter (Signed)
 Pt Calcium 7.0 and currently taking OTC oral calcium 1200 mg p.o daily. Verbal orders received from MD for pt to increase to TID.  RN educated pt and pt verbalized understanding.

## 2023-10-16 ENCOUNTER — Ambulatory Visit
Admission: RE | Admit: 2023-10-16 | Discharge: 2023-10-16 | Disposition: A | Discharge status: Home / Self Care | Payer: Commercial Managed Care - PPO | Source: Ambulatory Visit | Attending: Radiation Oncology | Admitting: Radiation Oncology

## 2023-10-16 DIAGNOSIS — C50919 Malignant neoplasm of unspecified site of unspecified female breast: Secondary | ICD-10-CM

## 2023-10-16 MED ORDER — GADOPICLENOL 0.5 MMOL/ML IV SOLN
9.0000 mL | Freq: Once | INTRAVENOUS | Status: AC | PRN
Start: 2023-10-16 — End: 2023-10-16
  Administered 2023-10-16: 9 mL via INTRAVENOUS

## 2023-10-19 ENCOUNTER — Telehealth: Payer: Self-pay

## 2023-10-19 NOTE — Telephone Encounter (Signed)
 Received a call from [place] at [facility] to give read report on patients recent MRI. Per [facility] radiologist wanted to make sure scan was reviewed by Dr. Basilio [place] especially impression 1.

## 2023-10-22 ENCOUNTER — Inpatient Hospital Stay: Payer: Commercial Managed Care - PPO | Admitting: Internal Medicine

## 2023-10-22 ENCOUNTER — Inpatient Hospital Stay: Payer: Commercial Managed Care - PPO

## 2023-10-22 ENCOUNTER — Other Ambulatory Visit: Payer: Self-pay | Admitting: Adult Health

## 2023-10-22 VITALS — BP 119/78 | HR 87 | Temp 98.4°F | Resp 16 | Ht 64.0 in | Wt 234.5 lb

## 2023-10-22 DIAGNOSIS — C50919 Malignant neoplasm of unspecified site of unspecified female breast: Secondary | ICD-10-CM | POA: Diagnosis not present

## 2023-10-22 DIAGNOSIS — C7931 Secondary malignant neoplasm of brain: Secondary | ICD-10-CM | POA: Diagnosis not present

## 2023-10-22 DIAGNOSIS — C50112 Malignant neoplasm of central portion of left female breast: Secondary | ICD-10-CM | POA: Diagnosis not present

## 2023-10-22 NOTE — Progress Notes (Signed)
 [facility] at [facility] [address]  [place], [place] [address] [phone]   Interval Evaluation  Date of Service: 10/22/23 Patient Name: Krystal [name] Patient MRN: [id number] Patient DOB: Aug 16, 1965 Provider: Henreitta Leber, MD  Identifying Statement:  Krystal [name] is a 58 y.o. female with Breast cancer metastasized to brain, unspecified laterality [facility])   Primary Cancer:  Oncologic History: Oncology History  Cancer of central portion of left female breast (HCC)  04/02/2014 Genetic Testing   BRCA1 [id number] pathogenic mutation identified on gene sequce and del/dup analyses of BRCA1 and BRCA2.  BRCA1 p.[id number] VUS identified as well.  The report date is 04/02/2014.  UPDATE: BRCA1 V.[id number] VUS has been reclassified as a likely benign variant.  The reclassification date is 04/21/2020.   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 N0 M0 stage IA: Oncotype DX 13 (ROR 9%)   04/16/2014 Surgery   Left upper back melanoma resected no residual melanoma identified on re-resection margins negative   05/26/2014 - 06/08/2015 Anti-estrogen oral therapy   Tamoxifen 20 mg daily with a plan to switch her to aromatase inhibitors once she gets oophorectomy ( patient did not continue antiestrogen therapy by choice)   08/13/2014 Surgery   oophorectomy with total abdominal hysterectomy   09/08/2015 -  Anti-estrogen oral therapy   Resumed anastrozole after she found recurrence of breast cancer; stopped in January 2018, started letrozole 2.5 mg daily 10/12/2016; switched to Exemestane 25 mg daily on 01/03/17   09/16/2015 Surgery   Skin, left: IDC grade 3; 0.5 cm, margins negative, ER 95%, PR 10%, HER-2 positive ratio 2.08   11/08/2015 - 02/2017 Chemotherapy   Herceptin every 3 weeks plus anastrozole    11/24/2015 - 01/14/2016 Radiation Therapy   Adjuvant radiation therapy by Dr. Roselind Messier   01/2017 -  Anti-estrogen oral therapy   Exemestane daily (stopped others due to side effects), due to muscle aches and pains switch to tamoxifen 5 mg daily starting 12/18/2017   08/07/2019 Relapse/Recurrence   Bone scan: Sclerotic metastatic lesions involving left femoral neck, right ninth rib and entire L5 vertebra; MRI left hip: Diffuse signal abnormality L5 vertebral body with possible extraosseous extension   08/22/2019 - 09/03/2019 Radiation Therapy   Palliative radiation with Dr. Roselind Messier to L5 and left proixmal femur.    10/2021 Treatment Plan Change   Fulvestrant and Verzenio   08/23/2022 Imaging   CT CAP 08/23/2022: New and enlarged pulmonary nodule consistent with progression.  Enlarged small low cervical and mediastinal nodes.  Multiple bone metastases minimally progressive.  Liver lesions evaluation is challenging because of hepatic steatosis with suspected liver progression, isolated tiny omental nodule     09/07/2022 PET scan   IMPRESSION: 1. Worsening of disease now with nodal, pulmonary and hepatic metastases. 2. Areas of variable FDG uptake with some new bone lesions and some areas of bony metastasis which are more focal, for instance in the pelvis where there was likely diffuse involvement on prior imaging. Many of the areas of sclerosis do not show substantial uptake as outlined above and some areas show improvement/resolution of FDG uptake seen previously but there are signs of active bony metastatic disease currently. 3. Likely LEFT adrenal adenoma. 4. Severe hepatic steatosis. 5. Aortic atherosclerosis.   Primary malignant neoplasm of breast with metastasis (HCC)  08/25/2019 Initial Diagnosis  Primary malignant neoplasm of breast with metastasis (HCC)   10/11/2022 -  Chemotherapy   Patient is on Treatment Plan : BREAST METASTATIC Fam-Trastuzumab Deruxtecan-nxki (Enhertu) (5.4) q21d        Interval History: Krystal [name] presents today for follow up after recent MRI brain.  No significant new or progressive changes.  Occasionally has migraine auras with no headache. Stress at home is elevated.  Still has the same left lower facial numbness.    H+P (10/09/22) Patient presents to review recent abnormal brain MRI.  She presented with short history of left sided facial numbness; this prompted MRI brain through Dr. Pamelia Hoit.  Breast cancer has been progressive, she is scheduled to initiate therapy with Enhertu later this week.  She also describes migraine headaches with aura, this has been presents for at least 4-5 years.  This might be "a little worse" in recent weeks.  Otherwise is functionally independent with activities of daily living.  Medications: Current Outpatient Medications on File Prior to Visit  Medication Sig Dispense Refill   ALPRAZolam (XANAX) 0.25 MG tablet Take 1 tablet (0.25 mg total) by mouth at bedtime as needed for anxiety. 30 tablet 3   aspirin EC 81 MG tablet Take 1 tablet (81 mg total) by mouth daily. Swallow whole. 90 tablet 3   aspirin-acetaminophen-caffeine (EXCEDRIN MIGRAINE) 250-250-65 MG tablet Take 2 tablets by mouth every 6 (six) hours as needed for headache.     CALCIUM PO Take 1,200 mg by mouth 2 (two) times daily.     dexamethasone (DECADRON) 4 MG tablet Take 1 tablets (4 mg) by mouth daily for 3 days after chemotherapy. Take with food. 30 tablet 1   dicyclomine (BENTYL) 20 MG tablet Take 1 tablet (20 mg total) by mouth every 6 (six) hours as needed for up to 7 days for spasms. 28 tablet 0   esomeprazole (NEXIUM) 40 MG capsule Take 40 mg by mouth daily.     levofloxacin (LEVAQUIN) 750 MG tablet Take 1 tablet (750 mg total) by mouth daily. 7 tablet 0   lidocaine-prilocaine (EMLA) cream Apply to affected area once 30 g 3   loperamide (IMODIUM) 2 MG capsule Take 2 mg by mouth daily as needed for diarrhea or loose stools. Take 2 tablets at first  diarrhea and then 1 tablet after additional loose stool. Max number in 24 hours is 8 tablets.     loratadine (CLARITIN) 10 MG tablet Take 10 mg by mouth daily.     meloxicam (MOBIC) 15 MG tablet TAKE 1 TABLET(15 MG) BY MOUTH DAILY AS NEEDED FOR PAIN 30 tablet 1   ondansetron (ZOFRAN) 8 MG tablet Take 1 tablet (8 mg total) by mouth every 8 (eight) hours as needed for nausea or vomiting. Start on the third day after chemotherapy. (Patient not taking: Reported on 10/11/2023) 30 tablet 1   prochlorperazine (COMPAZINE) 10 MG tablet Take 1 tablet (10 mg total) by mouth every 6 (six) hours as needed for nausea or vomiting. (Patient not taking: Reported on 10/11/2023) 30 tablet 1   rosuvastatin (CRESTOR) 10 MG tablet Take 1 tablet (10 mg total) by mouth daily. (Patient not taking: Reported on 10/11/2023) 90 tablet 3   traMADol (ULTRAM) 50 MG tablet Take 1 tablet (50 mg total) by mouth every 6 (six) hours as needed. 10 tablet 0   venlafaxine XR (EFFEXOR-XR) 75 MG 24 hr capsule TAKE 1 CAPSULE(75 MG) BY MOUTH AT BEDTIME 90 capsule 3   No current facility-administered medications on file prior  to visit.    Allergies:  Allergies  Allergen Reactions   Dilaudid [Hydromorphone] Other (See Comments)    RESPIRATORY DEPRESSION   Codeine Nausea And Vomiting   Past Medical History:  Past Medical History:  Diagnosis Date   Arthritis    shoulders   BRCA1 positive    BRCA1 [id number]   Cancer (HCC)    Chest wall mass 09/2015   Complication of anesthesia    respiratory depression/arrest with Dilaudid   Cough with sputum 09/10/2015   clear sputum, per pt.   Dental crowns present    Diabetes mellitus without complication (HCC)    type 2   Family history of Lynch syndrome    Family history of pancreatic cancer    GERD (gastroesophageal reflux disease)    takes OTC meds   Headache    History of breast cancer 2015   Stuffy and runny nose 09/10/2015   green drainage from nose, per pt.   Past Surgical  History:  Past Surgical History:  Procedure Laterality Date   ABDOMINAL HYSTERECTOMY     BREAST LUMPECTOMY Left 09/16/2015   Procedure: LEFT BREAST MASS EXCISION;  Surgeon: [name], MD;  Location: [facility];  Service: General;  Laterality: Left;   BREAST RECONSTRUCTION WITH PLACEMENT OF TISSUE EXPANDER AND FLEX HD (ACELLULAR HYDRATED DERMIS) Bilateral 04/16/2014   Procedure: BILATERAL BREAST RECONSTRUCTION WITH PLACEMENT OF TISSUE EXPANDER AND FLEX HD (ACELLULAR HYDRATED DERMIS);  Surgeon: Glenna Fellows, MD;  Location: [facility];  Service: Plastics;  Laterality: Bilateral;   CESAREAN SECTION  1999; 2001; 2005   CHOLECYSTECTOMY  1995   COLONOSCOPY     DEBRIDEMENT AND CLOSURE WOUND Bilateral 05/15/2014   Procedure: DEBRIDEMENT AND CLOSURE OF BILATERAL MASECTOMY INCISIONS WITH BREAST EXPANSION;  Surgeon: Glenna Fellows, MD;  Location: [facility];  Service: Plastics;  Laterality: Bilateral;   LAPAROSCOPIC ASSISTED VAGINAL HYSTERECTOMY N/A 08/13/2014   Procedure: OPEN LAPAROSCOPY, EXPLORATORY LAPAROTOMY, TOTAL ABDOMINAL HYSTERECTOMY WITH BILATERAL SALPINGECTOMY AND BILATERAL OOPHORECTOMY;  Surgeon: Juluis Mire, MD;  Location: [facility];  Service: Gynecology;  Laterality: N/A;   LIPOSUCTION WITH LIPOFILLING Bilateral 11/13/2014   Procedure: LIPOFILLING TO BILATERAL CHEST ;  Surgeon: Glenna Fellows, MD;  Location: [facility];  Service: Plastics;  Laterality: Bilateral;   MELANOMA EXCISION N/A 04/16/2014   Procedure: WIDE LOCAL EXCISION OF BACK MELANOMA;  Surgeon: [name], MD;  Location: [facility];  Service: General;  Laterality: N/A;   PORT-A-CATH REMOVAL Right 06/14/2017   Procedure: REMOVAL PORT-A-CATH;  Surgeon: [name], MD;  Location: [facility];  Service: General;  Laterality: Right;   PORTACATH PLACEMENT Right 10/21/2015   Procedure: INSERTION PORT-A-CATH  WITH ULTRASOUND ;  Surgeon: [name], MD;  Location: [facility];  Service: General;  Laterality: Right;   PORTACATH PLACEMENT N/A 10/10/2022   Procedure: INSERTION PORT-A-CATH;  Surgeon: [name], MD;  Location: [facility];  Service: General;  Laterality: N/A;   REMOVAL OF BILATERAL TISSUE EXPANDERS WITH PLACEMENT OF BILATERAL BREAST IMPLANTS Bilateral 11/13/2014   Procedure: REMOVAL OF BILATERAL TISSUE EXPANDERS WITH PLACEMENT OF BILATERAL SILICONE IMPLANTS ;  Surgeon: Glenna Fellows, MD;  Location: [facility];  Service: Plastics;  Laterality: Bilateral;   SIMPLE MASTECTOMY WITH AXILLARY SENTINEL NODE BIOPSY Bilateral 04/16/2014   Procedure: BILATERAL SKIN SPARING  MASTECTOMIES WITH LEFT AXILLARY SENTINEL NODE BIOPSY;  Surgeon: [name], MD;  Location:  SURGERY CENTER;  Service: General;  Laterality: Bilateral;   Social History:  Social History   Socioeconomic History   Marital status: Widowed    Spouse name: Not on file   Number of children: 3   Years of education: Not on file   Highest education level: Not on file  Occupational History   Occupation: Geophysical data processor: [employer]  Tobacco Use   Smoking status: Former    Current packs/day: 0.00    Types: Cigarettes    Quit date: 04/17/2014    Years since quitting: 9.5   Smokeless tobacco: Never   Tobacco comments:    smokes 1-2 cigs every week  Vaping Use   Vaping status: Never Used  Substance and Sexual Activity   Alcohol use: Yes    Comment: occasionally   Drug use: No   Sexual activity: Not Currently    Birth control/protection: Surgical  Other Topics Concern   Not on file  Social History Narrative   Not on file   Social Drivers of Health   Financial Resource Strain: Low Risk  (06/26/2023)   Received from [facility]   Overall Financial Resource Strain (CARDIA)    Difficulty of Paying Living Expenses: Not hard at all   Food Insecurity: No Food Insecurity (06/26/2023)   [facility] from [facility]   Hunger Vital Sign    Worried About Running Out of Food in the Last Year: Never true    Ran Out of Food in the Last Year: Never true  Transportation Needs: No Transportation Needs (06/26/2023)   Received from [facility] - Transportation    Lack of Transportation (Medical): No    Lack of Transportation (Non-Medical): No  Recent Concern: Transportation Needs - Unmet Transportation Needs (06/16/2023)   [facility] - Transportation    Lack of Transportation (Medical): Yes    Lack of Transportation (Non-Medical): Yes  Physical Activity: Unknown (07/28/2021)   Received from [facility], [facility]   Exercise Vital Sign    Days of Exercise per Week: 0 days    Minutes of Exercise per Session: Not on file  Stress: No Stress Concern Present (06/16/2023)   Received from [facility] - Occupational Stress Questionnaire    Feeling of Stress : Not at all  Social Connections: Unknown (12/18/2021)   Received from [facility], [facility]   Social Network    Social Network: Not on file  Intimate Partner Violence: Not At Risk (06/16/2023)   Received from [facility]   HITS    Over the last 12 months how often did your partner physically hurt you?: Never    Over the last 12 months how often did your partner insult you or talk down to you?: Never    Over the last 12 months how often did your partner threaten you with physical harm?: Never    Over the last 12 months how often did your partner scream or curse at you?: Never   Family History:  Family History  Problem Relation Age of Onset   Other Father        COVID complications   Breast cancer Paternal Aunt        dx > 50   Pancreatic cancer Paternal Uncle 22       smoker; deceased   Heart disease Maternal Grandmother    Heart attack Maternal Grandfather    Breast cancer  Paternal Grandmother  dx 17s; ? if had 2nd BC in 68s; deceased 40s   Other Cousin        Lynch syndrome due to PMS2   BRCA 1/2 Cousin     Review of Systems: Constitutional: Doesn't report fevers, chills or abnormal weight loss Eyes: Doesn't report blurriness of vision Ears, nose, mouth, throat, and face: Doesn't report sore throat Respiratory: Doesn't report cough, dyspnea or wheezes Cardiovascular: Doesn't report palpitation, chest discomfort  Gastrointestinal:  Doesn't report nausea, constipation, diarrhea GU: Doesn't report incontinence Skin: Doesn't report skin rashes Neurological: Per HPI Musculoskeletal: Doesn't report joint pain Behavioral/Psych: Doesn't report anxiety  Physical Exam: Vitals:   10/22/23 1511  BP: 119/78  Pulse: 87  Resp: 16  Temp: 98.4 F (36.9 C)  SpO2: 98%    KPS: 90. General: Alert, cooperative, pleasant, in no acute distress Head: Normal EENT: No conjunctival injection or scleral icterus.  Lungs: Resp effort normal Cardiac: Regular rate Abdomen: Non-distended abdomen Skin: No rashes cyanosis or petechiae. Extremities: No clubbing or edema  Neurologic Exam: Mental Status: Awake, alert, attentive to examiner. Oriented to self and environment. Language is fluent with intact comprehension.  Cranial Nerves: Visual acuity is grossly normal. Visual fields are full. Extra-ocular movements intact. No ptosis. Face is symmetric Motor: Tone and bulk are normal. Power is full in both arms and legs. Reflexes are symmetric, no pathologic reflexes present.  Sensory: Impaired left V2 distribution Gait: Normal.   Labs: I have reviewed the data as listed    Component Value Date/Time   NA 143 10/11/2023 1441   NA 142 02/16/2017 1110   K 3.4 (L) 10/11/2023 1441   K 4.3 02/16/2017 1110   CL 112 (H) 10/11/2023 1441   CO2 26 10/11/2023 1441   CO2 27 02/16/2017 1110   GLUCOSE 103 (H) 10/11/2023 1441   GLUCOSE 190 (H) 02/16/2017 1110   BUN 7  10/11/2023 1441   BUN 7.9 02/16/2017 1110   CREATININE 0.47 10/11/2023 1441   CREATININE 0.8 02/16/2017 1110   CALCIUM 7.0 (L) 10/11/2023 1441   CALCIUM 9.4 02/16/2017 1110   PROT 5.7 (L) 10/11/2023 1441   PROT 7.4 02/16/2017 1110   ALBUMIN 3.5 10/11/2023 1441   ALBUMIN 3.7 02/16/2017 1110   AST 24 10/11/2023 1441   AST 27 02/16/2017 1110   ALT 28 10/11/2023 1441   ALT 50 02/16/2017 1110   ALKPHOS 63 10/11/2023 1441   ALKPHOS 88 02/16/2017 1110   BILITOT 0.3 10/11/2023 1441   BILITOT 0.41 02/16/2017 1110   GFRNONAA >60 10/11/2023 1441   GFRAA >60 03/15/2020 0926   Lab Results  Component Value Date   WBC 3.5 (L) 10/11/2023   NEUTROABS 1.7 10/11/2023   HGB 12.3 10/11/2023   HCT 37.4 10/11/2023   MCV 92.8 10/11/2023   PLT 249 10/11/2023   Imaging:  [facility] Clinician Interpretation: I have personally reviewed the CNS images as listed.  My interpretation, in the context of the patient's clinical presentation, is treatment effect vs true progression  MR Brain W Wo Contrast Result Date: 10/18/2023 CLINICAL DATA:  Provided history: Primary malignant neoplasm of breast with metastasis. Metastatic disease evaluation. EXAM: MRI HEAD WITHOUT AND WITH CONTRAST TECHNIQUE: Multiplanar, multiecho pulse sequences of the brain and surrounding structures were obtained without and with intravenous contrast. CONTRAST:  9 mL Vueway intravenous contrast. COMPARISON:  Prior brain MRI examinations 04/13/2023 and earlier. FINDINGS: Brain: No age-advanced or lobar predominant cerebral atrophy. Unchanged leptomeningeal enhancement along the right central sulcus (for instance as  seen on series 13, image 124). Unchanged pachymeningeal enhancement overlying the high right frontal lobe (for instance as seen on series 15, image 32). Punctate focus of enhancement within/along the anterior left temporal lobe (series 13, image 63). This is unchanged from the prior brain MRI of 04/13/2023 and indeterminate for a  metastasis versus vascular enhancement. Punctate focus of enhancement within/along the right frontal operculum (series 13, image 80). This was not definitively present on prior exams and is indeterminate for a metastasis versus vascular enhancement. Unchanged punctate focus of enhancement within the right cerebellar hemisphere, favored to reflect vascular enhancement (series 13, image 18). No new sites of intracranial metastatic disease are identified. Numerous tiny foci of chronic hemosiderin deposition again demonstrated within the supratentorial brain. Mild multifocal T2 FLAIR hyperintense signal abnormality within the cerebral white matter, nonspecific but compatible with chronic small vessel ischemic disease. There is no acute infarct. No extra-axial fluid collection. No midline shift. Vascular: Maintained flow voids within the proximal large arterial vessels. Skull and upper cervical spine: Unchanged sclerotic calvarial metastases. Unchanged 6 mm T2 hyperintense and enhancing focus within the left parietal calvarium (series 15, image 19) (series 12, image 19). Sinuses/Orbits: No mass or acute finding within the imaged orbits. No significant paranasal sinus disease. Other: Left mastoid effusion. Impression #1 will be called to the ordering clinician or representative by the Radiologist Assistant, and communication documented in the PACS or [employer]. IMPRESSION: 1. Punctate focus of enhancement within/along the right frontal operculum. This was not definitively present on prior exams and is indeterminate for a metastasis versus vascular enhancement. 2. Unchanged enhancing leptomeningeal metastatic disease within the right central sulcus. 3. Unchanged enhancing pachymeningeal metastatic disease overlying the high right frontal lobe. 4. Punctate focus of enhancement within/along the anterior left temporal lobe. This is unchanged from the prior brain MRI and indeterminate for a metastasis versus vascular  enhancement. 5. Unchanged sclerotic calvarial metastases. 6. Unchanged 6 mm enhancing focus within the left parietal calvarium. Although nonspecific, this could potentially reflect an additional osseous metastasis. 7. Left mastoid effusion. Electronically Signed   By: Jackey Loge D.O.   On: 10/18/2023 12:56   CT CHEST ABDOMEN PELVIS W CONTRAST Result Date: 10/11/2023 CLINICAL DATA:  Metastatic breast cancer restaging * Tracking Code: BO * EXAM: CT CHEST, ABDOMEN, AND PELVIS WITH CONTRAST TECHNIQUE: Multidetector CT imaging of the chest, abdomen and pelvis was performed following the standard protocol during bolus administration of intravenous contrast. RADIATION DOSE REDUCTION: This exam was performed according to the departmental dose-optimization program which includes automated exposure control, adjustment of the mA and/or kV according to patient size and/or use of iterative reconstruction technique. CONTRAST:  OMNIPAQUE IOHEXOL 300 MG/ML  SOLN COMPARISON:  03/09/2023 FINDINGS: CT CHEST FINDINGS Cardiovascular: Right chest port catheter. Normal heart size. Left and right coronary artery calcifications. No pericardial effusion. Mediastinum/Nodes: No enlarged mediastinal, hilar, or axillary lymph nodes. Thyroid gland, trachea, and esophagus demonstrate no significant findings. Lungs/Pleura: Minimal subpleural radiation fibrosis of the anterior lungs. Slight interval decrease in size of an irregular nodule of the lingula, measuring 1.3 x 0.7 cm, previously 1.6 x 0.9 cm (series 4, image 85). Suprahilar nodule of the right upper lobe not clearly visible on today's examination (series 4, image 54). Small nodule of the medial left lung base with only a faint irregular residual (series 4, image 119). No new nodules. No pleural effusion or pneumothorax. Musculoskeletal: No chest wall abnormality. No acute osseous findings. CT ABDOMEN PELVIS FINDINGS Hepatobiliary: No significant change  in a hypodense lesion of  the liver dome, hepatic segment VII/VIII measuring 1.2 cm (series 2, image 45). Unchanged very subtle subcentimeter hypodense lesion of hepatic segment VI measuring 0.6 cm (series 2, image 66). Hepatic steatosis. Mildly coarse contour of the liver. No gallstones, gallbladder wall thickening, or biliary dilatation. Pancreas: Unremarkable. No pancreatic ductal dilatation or surrounding inflammatory changes. Spleen: Normal in size without significant abnormality. Adrenals/Urinary Tract: Unchanged left adrenal nodule, previously characterized as a benign adenoma requiring no specific further follow-up or characterization. Kidneys are normal, without renal calculi, solid lesion, or hydronephrosis. Bladder is unremarkable. Stomach/Bowel: Stomach is within normal limits. Appendix appears normal. No evidence of bowel wall thickening, distention, or inflammatory changes. Colonic diverticulosis. Vascular/Lymphatic: Aortic atherosclerosis. No enlarged abdominal or pelvic lymph nodes. Reproductive: Status post hysterectomy. Other: No abdominal wall hernia or abnormality. No ascites. Musculoskeletal: No acute osseous findings. Unchanged sclerotic osseous metastases throughout the axial and proximal appendicular skeleton. IMPRESSION: 1. Continued decrease in size of pulmonary metastases. 2. No significant change in small hypodense liver lesions. 3. Unchanged sclerotic osseous metastases throughout the axial and proximal appendicular skeleton. 4. Hepatic steatosis. Mildly coarse contour of the liver, suggestive of cirrhosis. Correlate with biochemical findings. 5. Coronary artery disease. Aortic Atherosclerosis (ICD10-I70.0). Electronically Signed   By: Jearld Lesch M.D.   On: 10/11/2023 15:39    Assessment/Plan Breast cancer metastasized to brain, unspecified laterality (HCC)  Laurell Coalson is clinically stable today.  MRI brian demonstrates tiny novel focus of enhancement within right frontal lobe.  Etiology is vascular  artifact vs tiny metastases vs other.  Recommended continued MRI surveillance only at this time.  Will con't with Enhertu with Dr. Pamelia Hoit.  We appreciate the opportunity to participate in the care of Ellenie Salome.    We ask that Jezlyn Westerfield return to clinic in 3 months following next brain MRI, or sooner as needed.  All questions were answered. The patient knows to call the clinic with any problems, questions or concerns. No barriers to learning were detected.  The total time spent in the encounter was 40 minutes and more than 50% was on counseling and review of test results   Henreitta Leber, MD Medical Director of Neuro-Oncology [facility] [place] 10/22/23 3:10 PM

## 2023-10-23 ENCOUNTER — Telehealth: Payer: Self-pay | Admitting: Hematology and Oncology

## 2023-10-23 NOTE — Telephone Encounter (Signed)
 Scheduled appointments per WQ. Talked with the patient and she is aware of the made appointments.

## 2023-10-24 ENCOUNTER — Other Ambulatory Visit: Payer: Self-pay

## 2023-10-31 MED FILL — Fosaprepitant Dimeglumine For IV Infusion 150 MG (Base Eq): INTRAVENOUS | Qty: 5 | Status: AC

## 2023-11-01 ENCOUNTER — Inpatient Hospital Stay: Payer: Commercial Managed Care - PPO | Admitting: Hematology and Oncology

## 2023-11-01 ENCOUNTER — Inpatient Hospital Stay: Payer: Commercial Managed Care - PPO

## 2023-11-01 ENCOUNTER — Other Ambulatory Visit: Payer: Commercial Managed Care - PPO

## 2023-11-01 ENCOUNTER — Ambulatory Visit: Payer: Commercial Managed Care - PPO

## 2023-11-01 ENCOUNTER — Ambulatory Visit: Payer: Commercial Managed Care - PPO | Admitting: Hematology and Oncology

## 2023-11-01 VITALS — BP 137/76 | HR 88 | Temp 98.4°F | Resp 18 | Ht 64.0 in | Wt 232.6 lb

## 2023-11-01 DIAGNOSIS — C50919 Malignant neoplasm of unspecified site of unspecified female breast: Secondary | ICD-10-CM

## 2023-11-01 DIAGNOSIS — C50112 Malignant neoplasm of central portion of left female breast: Secondary | ICD-10-CM | POA: Diagnosis not present

## 2023-11-01 DIAGNOSIS — Z95828 Presence of other vascular implants and grafts: Secondary | ICD-10-CM

## 2023-11-01 DIAGNOSIS — Z170 Estrogen receptor positive status [ER+]: Secondary | ICD-10-CM | POA: Diagnosis not present

## 2023-11-01 DIAGNOSIS — C7951 Secondary malignant neoplasm of bone: Secondary | ICD-10-CM

## 2023-11-01 LAB — CMP (CANCER CENTER ONLY)
ALT: 37 U/L (ref 0–44)
AST: 30 U/L (ref 15–41)
Albumin: 4.1 g/dL (ref 3.5–5.0)
Alkaline Phosphatase: 72 U/L (ref 38–126)
Anion gap: 4 — ABNORMAL LOW (ref 5–15)
BUN: 14 mg/dL (ref 6–20)
CO2: 31 mmol/L (ref 22–32)
Calcium: 8.9 mg/dL (ref 8.9–10.3)
Chloride: 106 mmol/L (ref 98–111)
Creatinine: 0.62 mg/dL (ref 0.44–1.00)
GFR, Estimated: >60 mL/min (ref >60)
Glucose, Bld: 132 mg/dL — ABNORMAL HIGH (ref 70–99)
Potassium: 4 mmol/L (ref 3.5–5.1)
Sodium: 141 mmol/L (ref 135–145)
Total Bilirubin: 0.3 mg/dL (ref 0.0–1.2)
Total Protein: 6.7 g/dL (ref 6.5–8.1)

## 2023-11-01 LAB — CBC WITH DIFFERENTIAL (CANCER CENTER ONLY)
Abs Immature Granulocytes: 0.01 10*3/uL (ref 0.00–0.07)
Basophils Absolute: 0 10*3/uL (ref 0.0–0.1)
Basophils Relative: 1 %
Eosinophils Absolute: 0.3 10*3/uL (ref 0.0–0.5)
Eosinophils Relative: 8 %
HCT: 35.5 % — ABNORMAL LOW (ref 36.0–46.0)
Hemoglobin: 11.9 g/dL — ABNORMAL LOW (ref 12.0–15.0)
Immature Granulocytes: 0 %
Lymphocytes Relative: 29 %
Lymphs Abs: 0.9 10*3/uL (ref 0.7–4.0)
MCH: 30.7 pg (ref 26.0–34.0)
MCHC: 33.5 g/dL (ref 30.0–36.0)
MCV: 91.7 fL (ref 80.0–100.0)
Monocytes Absolute: 0.4 10*3/uL (ref 0.1–1.0)
Monocytes Relative: 12 %
Neutro Abs: 1.6 10*3/uL — ABNORMAL LOW (ref 1.7–7.7)
Neutrophils Relative %: 50 %
Platelet Count: 238 10*3/uL (ref 150–400)
RBC: 3.87 MIL/uL (ref 3.87–5.11)
RDW: 14.6 % (ref 11.5–15.5)
WBC Count: 3.2 10*3/uL — ABNORMAL LOW (ref 4.0–10.5)
nRBC: 0 % (ref 0.0–0.2)

## 2023-11-01 MED ORDER — ACETAMINOPHEN 325 MG PO TABS
650.0000 mg | ORAL_TABLET | Freq: Once | ORAL | Status: AC
Start: 2023-11-01 — End: 2023-11-01
  Administered 2023-11-01: 650 mg via ORAL
  Filled 2023-11-01: qty 2

## 2023-11-01 MED ORDER — DEXTROSE 5 % IV SOLN: Freq: Once | INTRAVENOUS | Status: AC

## 2023-11-01 MED ORDER — SODIUM CHLORIDE 0.9 % IV SOLN
150.0000 mg | Freq: Once | INTRAVENOUS | Status: AC
  Administered 2023-11-01: 150 mg via INTRAVENOUS
  Filled 2023-11-01: qty 150

## 2023-11-01 MED ORDER — SODIUM CHLORIDE 0.9% FLUSH
10.0000 mL | INTRAVENOUS | Status: DC | PRN
  Administered 2023-11-01: 10 mL

## 2023-11-01 MED ORDER — DEXAMETHASONE SODIUM PHOSPHATE 10 MG/ML IJ SOLN
10.0000 mg | Freq: Once | INTRAMUSCULAR | Status: AC
  Administered 2023-11-01: 10 mg via INTRAVENOUS
  Filled 2023-11-01: qty 1

## 2023-11-01 MED ORDER — DEXTROSE 5 % IV SOLN
3.2000 mg/kg | Freq: Once | INTRAVENOUS | Status: AC
  Administered 2023-11-01: 300 mg via INTRAVENOUS
  Filled 2023-11-01: qty 15

## 2023-11-01 MED ORDER — HEPARIN SOD (PORK) LOCK FLUSH 100 UNIT/ML IV SOLN
500.0000 [IU] | Freq: Once | INTRAVENOUS | Status: AC | PRN
  Administered 2023-11-01: 500 [IU]

## 2023-11-01 MED ORDER — SODIUM CHLORIDE 0.9 % IV SOLN
25.0000 mg | Freq: Once | INTRAVENOUS | Status: AC
  Administered 2023-11-01: 25 mg via INTRAVENOUS
  Filled 2023-11-01: qty 1

## 2023-11-01 MED ORDER — DIPHENHYDRAMINE HCL 25 MG PO CAPS
25.0000 mg | ORAL_CAPSULE | Freq: Once | ORAL | Status: AC
Start: 2023-11-01 — End: 2023-11-01
  Administered 2023-11-01: 25 mg via ORAL
  Filled 2023-11-01: qty 1

## 2023-11-01 MED ORDER — SODIUM CHLORIDE 0.9% FLUSH
10.0000 mL | Freq: Once | INTRAVENOUS | Status: AC
  Administered 2023-11-01: 10 mL

## 2023-11-01 NOTE — Progress Notes (Signed)
 Patient Care Team: Patient, [name] Serena Croissant, MD as Medical Oncologist (Hematology and Oncology) [name], MD as Consulting Physician (Radiation Oncology) [name], NP as Nurse Practitioner (Hematology and Oncology) [name], MD as Consulting Physician (General Surgery) [name], RPH-CPP as Pharmacist (Hematology and Oncology)  DIAGNOSIS:  Encounter Diagnosis  Name Primary?   Malignant neoplasm of central portion of left breast in female, estrogen receptor positive (HCC) Yes    SUMMARY OF ONCOLOGIC HISTORY: Oncology History  Cancer of central portion of left female breast (HCC)  04/02/2014 Genetic Testing   BRCA1 [id number] pathogenic mutation identified on gene sequce and del/dup analyses of BRCA1 and BRCA2.  BRCA1 [id number] VUS identified as well.  The report date is 04/02/2014.  UPDATE: BRCA1 [id number] VUS has been reclassified as a likely benign variant.  The reclassification date is 04/21/2020.   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 N0 M0 stage IA: Oncotype DX 13 (ROR 9%)   04/16/2014 Surgery   Left upper back melanoma resected no residual melanoma identified on re-resection margins negative   05/26/2014 - 06/08/2015 Anti-estrogen oral therapy   Tamoxifen 20 mg daily with a plan to switch her to aromatase inhibitors once Krystal [name] gets oophorectomy ( patient did not continue antiestrogen therapy by choice)   08/13/2014 Surgery   oophorectomy with total abdominal hysterectomy   09/08/2015 -  Anti-estrogen oral therapy   Resumed anastrozole after Krystal [name] found recurrence of breast cancer; stopped in January 2018, started letrozole 2.5 mg daily 10/12/2016; switched to Exemestane 25 mg daily on 01/03/17   09/16/2015 Surgery   Skin, left:  IDC grade 3; 0.5 cm, margins negative, ER 95%, PR 10%, HER-2 positive ratio 2.08   11/08/2015 - 02/2017 Chemotherapy   Herceptin every 3 weeks plus anastrozole   11/24/2015 - 01/14/2016 Radiation Therapy   Adjuvant radiation therapy by Dr. Roselind Messier   01/2017 -  Anti-estrogen oral therapy   Exemestane daily (stopped others due to side effects), due to muscle aches and pains switch to tamoxifen 5 mg daily starting 12/18/2017   08/07/2019 Relapse/Recurrence   Bone scan: Sclerotic metastatic lesions involving left femoral neck, right ninth rib and entire L5 vertebra; MRI left hip: Diffuse signal abnormality L5 vertebral body with possible extraosseous extension   08/22/2019 - 09/03/2019 Radiation Therapy   Palliative radiation with Dr. Roselind Messier to L5 and left proixmal femur.    10/2021 Treatment Plan Change   Fulvestrant and Verzenio   08/23/2022 Imaging   CT CAP 08/23/2022: New and enlarged pulmonary nodule consistent with progression.  Enlarged small low cervical and mediastinal nodes.  Multiple bone metastases minimally progressive.  Liver lesions evaluation is challenging because of hepatic steatosis with suspected liver progression, isolated tiny omental nodule     09/07/2022 PET scan   IMPRESSION: 1. Worsening of disease now with nodal, pulmonary and hepatic metastases. 2. Areas of variable FDG uptake with some new bone lesions and some areas of bony metastasis which are more focal, for instance in the pelvis where there was likely diffuse involvement on prior imaging. Many of the areas of sclerosis do not show substantial uptake as outlined above and some areas show improvement/resolution of FDG uptake seen previously but there are signs of active bony metastatic disease currently. 3. Likely LEFT adrenal adenoma. 4. Severe  hepatic steatosis. 5. Aortic atherosclerosis.   Primary malignant neoplasm of breast with metastasis (HCC)  08/25/2019 Initial Diagnosis   Primary malignant neoplasm of  breast with metastasis (HCC)   10/11/2022 -  Chemotherapy   Patient is on Treatment Plan : BREAST METASTATIC Fam-Trastuzumab Deruxtecan-nxki (Enhertu) (5.4) q21d       CHIEF COMPLIANT: Follow-up on Enhertu  HISTORY OF PRESENT ILLNESS:  History of Present Illness Krystal [name], a patient with a known brain lesion, presents for a follow-up visit. Krystal [name] reports occasional visual disturbances in one eye, described as "white lightening," which Krystal [name] believes occurs when her eyes are tired. However, Krystal [name] notes that these symptoms are not constant and are manageable.  Krystal [name] also discusses side effects from her current treatment, specifically nausea that paradoxically seems to occur when Krystal [name] takes her anti-nausea medication. Despite this, Krystal [name] prefers to continue with the current regimen as it helps manage other side effects, such as constipation.  Additionally, Krystal [name] mentions taking calcium supplements three times a day due to a previous low reading (7.0), which was unusual for her as her levels are typically around 9. Krystal [name] is awaiting current calcium level results.     ALLERGIES:  is allergic to dilaudid [hydromorphone] and codeine.  MEDICATIONS:  Current Outpatient Medications  Medication Sig Dispense Refill   ALPRAZolam (XANAX) 0.25 MG tablet Take 1 tablet (0.25 mg total) by mouth at bedtime as needed for anxiety. 30 tablet 3   aspirin EC 81 MG tablet Take 1 tablet (81 mg total) by mouth daily. Swallow whole. 90 tablet 3   aspirin-acetaminophen-caffeine (EXCEDRIN MIGRAINE) 250-250-65 MG tablet Take 2 tablets by mouth every 6 (six) hours as needed for headache.     CALCIUM PO Take 1,200 mg by mouth 2 (two) times daily.     dexamethasone (DECADRON) 4 MG tablet Take 1 tablets (4 mg) by mouth daily for 3 days after chemotherapy. Take with food. 30 tablet 1   dicyclomine (BENTYL) 20 MG tablet Take 1 tablet (20 mg total) by mouth every 6 (six) hours as needed for up to 7 days for spasms. 28 tablet 0   esomeprazole  (NEXIUM) 40 MG capsule Take 40 mg by mouth daily.     levofloxacin (LEVAQUIN) 750 MG tablet Take 1 tablet (750 mg total) by mouth daily. 7 tablet 0   lidocaine-prilocaine (EMLA) cream Apply to affected area once 30 g 3   loperamide (IMODIUM) 2 MG capsule Take 2 mg by mouth daily as needed for diarrhea or loose stools. Take 2 tablets at first diarrhea and then 1 tablet after additional loose stool. Max number in 24 hours is 8 tablets.     loratadine (CLARITIN) 10 MG tablet Take 10 mg by mouth daily.     meloxicam (MOBIC) 15 MG tablet TAKE 1 TABLET(15 MG) BY MOUTH DAILY AS NEEDED FOR PAIN 30 tablet 1   ondansetron (ZOFRAN) 8 MG tablet Take 1 tablet (8 mg total) by mouth every 8 (eight) hours as needed for nausea or vomiting. Start on the third day after chemotherapy. 30 tablet 1   prochlorperazine (COMPAZINE) 10 MG tablet Take 1 tablet (10 mg total) by mouth every 6 (six) hours as needed for nausea or vomiting. 30 tablet 1   rosuvastatin (CRESTOR) 10 MG tablet Take 1 tablet (10 mg total) by mouth daily. 90 tablet 3   traMADol (ULTRAM) 50 MG tablet Take 1 tablet (50 mg total) by mouth every 6 (six) hours as needed. 10 tablet 0   traZODone (DESYREL)  50 MG tablet TAKE 1 TABLET(50 MG) BY MOUTH AT BEDTIME 90 tablet 3   venlafaxine XR (EFFEXOR-XR) 75 MG 24 hr capsule TAKE 1 CAPSULE(75 MG) BY MOUTH AT BEDTIME 90 capsule 3   No current facility-administered medications for this visit.    PHYSICAL EXAMINATION: ECOG PERFORMANCE STATUS: 1 - Symptomatic but completely ambulatory  Vitals:   11/01/23 1520  BP: 137/76  Pulse: 88  Resp: 18  Temp: 98.4 F (36.9 C)  SpO2: 98%   Filed Weights   11/01/23 1520  Weight: 232 lb 9.6 oz (105.5 kg)     LABORATORY DATA:  I have reviewed the data as listed    Latest Ref Rng & Units 10/11/2023    2:41 PM 09/20/2023    3:00 PM 08/30/2023    3:13 PM  CMP  Glucose 70 - 99 mg/dL 621  308  657   BUN 6 - 20 mg/dL 7  12  11    Creatinine 0.44 - 1.00 mg/dL 8.46   9.62  9.52   Sodium 135 - 145 mmol/L 143  140  139   Potassium 3.5 - 5.1 mmol/L 3.4  4.0  4.1   Chloride 98 - 111 mmol/L 112  104  102   CO2 22 - 32 mmol/L 26  31  28    Calcium 8.9 - 10.3 mg/dL 7.0  8.9  9.1   Total Protein 6.5 - 8.1 g/dL 5.7  6.7  6.9   Total Bilirubin 0.0 - 1.2 mg/dL 0.3  0.3  0.4   Alkaline Phos 38 - 126 U/L 63  73  69   AST 15 - 41 U/L 24  20  31    ALT 0 - 44 U/L 28  26  43     Lab Results  Component Value Date   WBC 3.2 (L) 11/01/2023   HGB 11.9 (L) 11/01/2023   HCT 35.5 (L) 11/01/2023   MCV 91.7 11/01/2023   PLT 238 11/01/2023   NEUTROABS 1.6 (L) 11/01/2023    ASSESSMENT & PLAN:  Cancer of central portion of left female breast (HCC) 08/22/2019: PET CT scan: Metastatic disease in the chest with multiple lung nodules all subcentimeter size and hypermetabolic lymph nodes (right paratracheal node 7 mm, subcarinal node 1 cm, right hilar node 6 mm), retroperitoneal and upper pelvic common iliac lymph nodes, bone metastases especially L5, L4, T6, right 10th rib, left proximal femur   Bone biopsy was not felt to be reliable in addition to the effect of radiation on the bone. Palliative radiation completed 09/03/2019 [facility]: BRCA1 mutation, TMB 5.36, MSI: Normal   Prior treatment: Ibrance with letrozole and Xgeva for bone metastases switched to abemaciclib and Fulvestrant   Bone metastasis: Patient does not want to take bisphosphonates CT CAP 08/23/2022: New and enlarged pulmonary nodule consistent with progression.  Enlarged small low cervical and mediastinal nodes.  Multiple bone metastases minimally progressive.  Liver lesions evaluation is challenging because of hepatic steatosis with suspected liver progression, isolated tiny omental nodule   Ultrasound left supraclavicular biopsy 09/22/2022: Metastatic breast cancer ER 90%, PR 0%, Ki-67 60%, HER2 1+ Brain MRI: 10/03/2022: Small foci of enhancement in the right central sulcus and within an anterior right  frontal sulcus concerning for leptomeningeal disease.  Skull metastases, abnormal enhancement left globe  ---------------------------------------------------------------- Current treatment:: Enhertu cycle 18 (now every 4 weeks) Enhertu toxicities: Fatigue   CT CAP 03/12/2023: Improvement in pulmonary metastases.  No thoracic lymphadenopathy.  Similar hepatic lesions Brain MRI 04/16/2023: Unchanged  leptomeningeal enhancement.   CT CAP 10/11/2023: Continued decrease in the pulmonary metastases, no significant change in the liver lesions, unchanged bone metastases MRI brain 10/18/2023: Punctate focus of enhancement right frontal operculum, unchanged enhancing leptomeningeal metastatic disease within the right central sulcus, unchanged enhancing pachymeningeal metastatic disease high right frontal lobe, punctate focus of enhancement left temporal lobe unchanged, calvarial metastasis  Dr. Barbaraann Cao recommended continued MRI surveillance Krystal [name] is extremely happy that Krystal [name] was able to find a rental property and that Krystal [name] does not have to stay in the current home any longer.  It is closer to her workplace. Return to clinic every 4-week for Enhertu treatments. ------------------------------------- Assessment and Plan Assessment & Plan Malignant neoplasm of central portion of left breast, estrogen receptor positive Under surveillance with no new symptoms. MRI planned to monitor tumor growth. SRS considered for precise targeting if growth detected. - Order MRI to monitor tumor growth. - Consider SRS if tumor growth is detected.  Chemotherapy-Induced Nausea Nausea managed with current anti-nausea regimen, preventing constipation. Patient tolerates monthly nausea for medication benefits. - Continue current anti-nausea medication regimen. - Maintain chemotherapy schedule at 28-day intervals.  Hypocalcemia Previous calcium level 7.0 mg/dL. Current level pending. Continuing calcium supplements. - Continue calcium  supplementation. - Review current calcium level when available.  Anxiety Managed with alprazolam as needed. No new concerns. - Continue alprazolam 0.25 mg at bedtime as needed for anxiety.      No orders of the defined types were placed in this encounter.  The patient has a good understanding of the overall plan. Krystal [name] agrees with it. Krystal [name] will call with any problems that may develop before the next visit here. Total time spent: 30 mins including face to face time and time spent for planning, charting and co-ordination of care   Tamsen Meek, MD 11/01/23

## 2023-11-01 NOTE — Patient Instructions (Signed)

## 2023-11-01 NOTE — Patient Instructions (Signed)

## 2023-11-01 NOTE — Assessment & Plan Note (Signed)
 08/22/2019: PET CT scan: Metastatic disease in the chest with multiple lung nodules all subcentimeter size and hypermetabolic lymph nodes (right paratracheal node 7 mm, subcarinal node 1 cm, right hilar node 6 mm), retroperitoneal and upper pelvic common iliac lymph nodes, bone metastases especially L5, L4, T6, right 10th rib, left proximal femur   Bone biopsy was not felt to be reliable in addition to the effect of radiation on the bone. Palliative radiation completed 09/03/2019 [facility]: BRCA1 mutation, TMB 5.36, MSI: Normal   Prior treatment: Ibrance with letrozole and Xgeva for bone metastases switched to abemaciclib and Fulvestrant   Bone metastasis: Patient does not want to take bisphosphonates CT CAP 08/23/2022: New and enlarged pulmonary nodule consistent with progression.  Enlarged small low cervical and mediastinal nodes.  Multiple bone metastases minimally progressive.  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  Skull metastases, abnormal enhancement left globe  ---------------------------------------------------------------- Current treatment:: Enhertu cycle 18 (now every 4 weeks) Enhertu toxicities: Fatigue   CT CAP 03/12/2023: Improvement in pulmonary metastases.  No thoracic lymphadenopathy.  Similar hepatic lesions Brain MRI 04/16/2023: Unchanged leptomeningeal enhancement.   CT CAP 10/11/2023: Continued decrease in the pulmonary metastases, no significant change in the liver lesions, unchanged bone metastases MRI brain 10/18/2023: Punctate focus of enhancement right frontal operculum, unchanged enhancing leptomeningeal metastatic  disease within the right central sulcus, unchanged enhancing pachymeningeal metastatic disease high right frontal lobe, punctate focus of enhancement left temporal lobe unchanged, calvarial metastasis  Dr. Barbaraann Cao recommended continued MRI surveillance   Return to clinic every 4-week for Enhertu treatments.

## 2023-11-07 ENCOUNTER — Telehealth: Payer: Self-pay | Admitting: *Deleted

## 2023-11-07 ENCOUNTER — Other Ambulatory Visit: Payer: Self-pay | Admitting: Pharmacist

## 2023-11-07 NOTE — Telephone Encounter (Signed)
 Received call from pt stating she tested positive for Covid on 11/06/23 and has been prescribed Paxlovid by Urgent Care.  Pt requesting advice from MD if okay to proceed with Paxlovid.  Per MD, okay for pt to proceed.  MD also states pt tx will need to be pushed out 21 days.  Message sent to scheduling team to push out tx to 11/29/23.

## 2023-11-22 ENCOUNTER — Inpatient Hospital Stay: Admitting: Hematology and Oncology

## 2023-11-22 ENCOUNTER — Inpatient Hospital Stay

## 2023-11-22 ENCOUNTER — Ambulatory Visit: Payer: Commercial Managed Care - PPO | Admitting: Adult Health

## 2023-11-22 ENCOUNTER — Inpatient Hospital Stay: Attending: Hematology and Oncology

## 2023-11-22 ENCOUNTER — Ambulatory Visit: Payer: Commercial Managed Care - PPO

## 2023-11-22 ENCOUNTER — Other Ambulatory Visit: Payer: Commercial Managed Care - PPO

## 2023-11-28 MED FILL — Fosaprepitant Dimeglumine For IV Infusion 150 MG (Base Eq): INTRAVENOUS | Qty: 5 | Status: AC

## 2023-11-29 ENCOUNTER — Inpatient Hospital Stay: Attending: Hematology and Oncology

## 2023-11-29 ENCOUNTER — Other Ambulatory Visit: Payer: Self-pay | Admitting: *Deleted

## 2023-11-29 ENCOUNTER — Inpatient Hospital Stay

## 2023-11-29 ENCOUNTER — Inpatient Hospital Stay: Admitting: Hematology and Oncology

## 2023-11-29 VITALS — BP 124/88 | HR 90 | Temp 97.6°F | Resp 18 | Ht 64.0 in | Wt 228.4 lb

## 2023-11-29 DIAGNOSIS — Z7952 Long term (current) use of systemic steroids: Secondary | ICD-10-CM | POA: Diagnosis not present

## 2023-11-29 DIAGNOSIS — Z79899 Other long term (current) drug therapy: Secondary | ICD-10-CM

## 2023-11-29 DIAGNOSIS — C50919 Malignant neoplasm of unspecified site of unspecified female breast: Secondary | ICD-10-CM

## 2023-11-29 DIAGNOSIS — Z5112 Encounter for antineoplastic immunotherapy: Secondary | ICD-10-CM | POA: Insufficient documentation

## 2023-11-29 DIAGNOSIS — Z95828 Presence of other vascular implants and grafts: Secondary | ICD-10-CM

## 2023-11-29 DIAGNOSIS — C50112 Malignant neoplasm of central portion of left female breast: Secondary | ICD-10-CM | POA: Diagnosis not present

## 2023-11-29 DIAGNOSIS — Z7982 Long term (current) use of aspirin: Secondary | ICD-10-CM | POA: Diagnosis not present

## 2023-11-29 DIAGNOSIS — F419 Anxiety disorder, unspecified: Secondary | ICD-10-CM | POA: Insufficient documentation

## 2023-11-29 DIAGNOSIS — C7951 Secondary malignant neoplasm of bone: Secondary | ICD-10-CM | POA: Diagnosis not present

## 2023-11-29 DIAGNOSIS — Z923 Personal history of irradiation: Secondary | ICD-10-CM | POA: Diagnosis not present

## 2023-11-29 DIAGNOSIS — Z170 Estrogen receptor positive status [ER+]: Secondary | ICD-10-CM | POA: Diagnosis not present

## 2023-11-29 DIAGNOSIS — Z1501 Genetic susceptibility to malignant neoplasm of breast: Secondary | ICD-10-CM | POA: Diagnosis not present

## 2023-11-29 LAB — CBC WITH DIFFERENTIAL (CANCER CENTER ONLY)
Abs Immature Granulocytes: 0.01 10*3/uL (ref 0.00–0.07)
Basophils Absolute: 0 10*3/uL (ref 0.0–0.1)
Basophils Relative: 1 %
Eosinophils Absolute: 0.3 10*3/uL (ref 0.0–0.5)
Eosinophils Relative: 8 %
HCT: 37.3 % (ref 36.0–46.0)
Hemoglobin: 12.4 g/dL (ref 12.0–15.0)
Immature Granulocytes: 0 %
Lymphocytes Relative: 29 %
Lymphs Abs: 1 10*3/uL (ref 0.7–4.0)
MCH: 30.4 pg (ref 26.0–34.0)
MCHC: 33.2 g/dL (ref 30.0–36.0)
MCV: 91.4 fL (ref 80.0–100.0)
Monocytes Absolute: 0.3 10*3/uL (ref 0.1–1.0)
Monocytes Relative: 9 %
Neutro Abs: 1.8 10*3/uL (ref 1.7–7.7)
Neutrophils Relative %: 53 %
Platelet Count: 245 10*3/uL (ref 150–400)
RBC: 4.08 MIL/uL (ref 3.87–5.11)
RDW: 14.7 % (ref 11.5–15.5)
WBC Count: 3.5 10*3/uL — ABNORMAL LOW (ref 4.0–10.5)
nRBC: 0 % (ref 0.0–0.2)

## 2023-11-29 LAB — CMP (CANCER CENTER ONLY)
ALT: 38 U/L (ref 0–44)
AST: 33 U/L (ref 15–41)
Albumin: 4.2 g/dL (ref 3.5–5.0)
Alkaline Phosphatase: 69 U/L (ref 38–126)
Anion gap: 6 (ref 5–15)
BUN: 11 mg/dL (ref 6–20)
CO2: 29 mmol/L (ref 22–32)
Calcium: 9.1 mg/dL (ref 8.9–10.3)
Chloride: 106 mmol/L (ref 98–111)
Creatinine: 0.62 mg/dL (ref 0.44–1.00)
GFR, Estimated: >60 mL/min (ref >60)
Glucose, Bld: 102 mg/dL — ABNORMAL HIGH (ref 70–99)
Potassium: 4.1 mmol/L (ref 3.5–5.1)
Sodium: 141 mmol/L (ref 135–145)
Total Bilirubin: 0.5 mg/dL (ref 0.0–1.2)
Total Protein: 6.8 g/dL (ref 6.5–8.1)

## 2023-11-29 MED ORDER — SODIUM CHLORIDE 0.9 % IV SOLN
150.0000 mg | Freq: Once | INTRAVENOUS | Status: AC
  Administered 2023-11-29: 150 mg via INTRAVENOUS
  Filled 2023-11-29: qty 150

## 2023-11-29 MED ORDER — DEXTROSE 5 % IV SOLN: Freq: Once | INTRAVENOUS | Status: AC

## 2023-11-29 MED ORDER — SODIUM CHLORIDE 0.9% FLUSH
10.0000 mL | Freq: Once | INTRAVENOUS | Status: AC
  Administered 2023-11-29: 10 mL

## 2023-11-29 MED ORDER — ACETAMINOPHEN 325 MG PO TABS
650.0000 mg | ORAL_TABLET | Freq: Once | ORAL | Status: AC
Start: 2023-11-29 — End: 2023-11-29
  Administered 2023-11-29: 650 mg via ORAL
  Filled 2023-11-29: qty 2

## 2023-11-29 MED ORDER — FAM-TRASTUZUMAB DERUXTECAN-NXKI CHEMO 100 MG IV SOLR
3.2000 mg/kg | Freq: Once | INTRAVENOUS | Status: AC
  Administered 2023-11-29: 300 mg via INTRAVENOUS
  Filled 2023-11-29: qty 15

## 2023-11-29 MED ORDER — DIPHENHYDRAMINE HCL 25 MG PO CAPS
25.0000 mg | ORAL_CAPSULE | Freq: Once | ORAL | Status: AC
  Administered 2023-11-29: 25 mg via ORAL
  Filled 2023-11-29: qty 1

## 2023-11-29 MED ORDER — DEXAMETHASONE SODIUM PHOSPHATE 10 MG/ML IJ SOLN
10.0000 mg | Freq: Once | INTRAMUSCULAR | Status: AC
  Administered 2023-11-29: 10 mg via INTRAVENOUS
  Filled 2023-11-29: qty 1

## 2023-11-29 MED ORDER — HEPARIN SOD (PORK) LOCK FLUSH 100 UNIT/ML IV SOLN
500.0000 [IU] | Freq: Once | INTRAVENOUS | Status: AC | PRN
  Administered 2023-11-29: 500 [IU]

## 2023-11-29 MED ORDER — SODIUM CHLORIDE 0.9% FLUSH
10.0000 mL | INTRAVENOUS | Status: DC | PRN
  Administered 2023-11-29: 10 mL

## 2023-11-29 MED ORDER — SODIUM CHLORIDE 0.9 % IV SOLN
25.0000 mg | Freq: Once | INTRAVENOUS | Status: AC
  Administered 2023-11-29: 25 mg via INTRAVENOUS
  Filled 2023-11-29: qty 1

## 2023-11-29 NOTE — Assessment & Plan Note (Signed)
 08/22/2019: PET CT scan: Metastatic disease in the chest with multiple lung nodules all subcentimeter size and hypermetabolic lymph nodes (right paratracheal node 7 mm, subcarinal node 1 cm, right hilar node 6 mm), retroperitoneal and upper pelvic common iliac lymph nodes, bone metastases especially L5, L4, T6, right 10th rib, left proximal femur   Bone biopsy was not felt to be reliable in addition to the effect of radiation on the bone. Palliative radiation completed 09/03/2019 [facility]: BRCA1 mutation, TMB 5.36, MSI: Normal   Prior treatment: Ibrance  with letrozole  and Xgeva for bone metastases switched to abemaciclib  and Fulvestrant    Bone metastasis: Patient does not want to take bisphosphonates CT CAP 08/23/2022: New and enlarged pulmonary nodule consistent with progression.  Enlarged small low cervical and mediastinal nodes.  Multiple bone metastases minimally progressive.  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  Skull metastases, abnormal enhancement left globe  ---------------------------------------------------------------- Current treatment:: Enhertu  cycle 19 (now every 4 weeks) Enhertu  toxicities: Fatigue    CT CAP 10/11/2023: Continued decrease in the pulmonary metastases, no significant change in the liver lesions, unchanged bone metastases MRI brain 10/18/2023: Punctate focus of enhancement right frontal operculum, unchanged enhancing leptomeningeal metastatic disease within the right central sulcus, unchanged enhancing pachymeningeal metastatic disease high right frontal lobe, punctate focus of enhancement left temporal lobe  unchanged, calvarial metastasis   Dr. Mark Sil recommended continued MRI surveillance  Return to clinic every 4-week for Enhertu  treatments.

## 2023-11-29 NOTE — Patient Instructions (Signed)

## 2023-11-29 NOTE — Progress Notes (Signed)
 Patient Care Team: Patient, [name] Cameron Cea, MD as Medical Oncologist (Hematology and Oncology) Retta Caster, MD as Consulting Physician (Radiation Oncology) Debbie Fails Laura Polio, NP as Nurse Practitioner (Hematology and Oncology) [name] Harry, MD as Consulting Physician (General Surgery) Althea Atkinson, RPH-CPP as Pharmacist (Hematology and Oncology)  DIAGNOSIS:  Encounter Diagnosis  Name Primary?   Malignant neoplasm of central portion of left breast in female, estrogen receptor positive (HCC) Yes    SUMMARY OF ONCOLOGIC HISTORY: Oncology History  Cancer of central portion of left female breast (HCC)  04/02/2014 Genetic Testing   BRCA1 [id number] pathogenic mutation identified on gene sequce and del/dup analyses of BRCA1 and BRCA2.  BRCA1 [id number] VUS identified as well.  The report date is 04/02/2014.  UPDATE: BRCA1 [id number] VUS has been reclassified as a likely benign variant.  The reclassification date is 04/21/2020.   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 N0 M0 stage IA: Oncotype DX 13 (ROR 9%)   04/16/2014 Surgery   Left upper back melanoma resected no residual melanoma identified on re-resection margins negative   05/26/2014 - 06/08/2015 Anti-estrogen oral therapy   Tamoxifen  20 mg daily with a plan to switch her to aromatase inhibitors once she gets oophorectomy ( patient did not continue antiestrogen therapy by choice)   08/13/2014 Surgery   oophorectomy with total abdominal hysterectomy   09/08/2015 -  Anti-estrogen oral therapy   Resumed anastrozole  after she found recurrence of breast cancer; stopped in January 2018, started letrozole  2.5 mg daily 10/12/2016; switched to Exemestane  25 mg daily on 01/03/17   09/16/2015 Surgery   Skin, left:  IDC grade 3; 0.5 cm, margins negative, ER 95%, PR 10%, HER-2 positive ratio 2.08   11/08/2015 - 02/2017 Chemotherapy   Herceptin  every 3 weeks plus anastrozole    11/24/2015 - 01/14/2016 Radiation Therapy   Adjuvant radiation therapy by Dr. Eloise Hake   01/2017 -  Anti-estrogen oral therapy   Exemestane  daily (stopped others due to side effects), due to muscle aches and pains switch to tamoxifen  5 mg daily starting 12/18/2017   08/07/2019 Relapse/Recurrence   Bone scan: Sclerotic metastatic lesions involving left femoral neck, right ninth rib and entire L5 vertebra; MRI left hip: Diffuse signal abnormality L5 vertebral body with possible extraosseous extension   08/22/2019 - 09/03/2019 Radiation Therapy   Palliative radiation with Dr. Eloise Hake to L5 and left proixmal femur.    10/2021 Treatment Plan Change   Fulvestrant  and Verzenio    08/23/2022 Imaging   CT CAP 08/23/2022: New and enlarged pulmonary nodule consistent with progression.  Enlarged small low cervical and mediastinal nodes.  Multiple bone metastases minimally progressive.  Liver lesions evaluation is challenging because of hepatic steatosis with suspected liver progression, isolated tiny omental nodule     09/07/2022 PET scan   IMPRESSION: 1. Worsening of disease now with nodal, pulmonary and hepatic metastases. 2. Areas of variable FDG uptake with some new bone lesions and some areas of bony metastasis which are more focal, for instance in the pelvis where there was likely diffuse involvement on prior imaging. Many of the areas of sclerosis do not show substantial uptake as outlined above and some areas show improvement/resolution of FDG uptake seen previously but there are signs of active bony metastatic disease currently. 3. Likely LEFT adrenal adenoma. 4. Severe  hepatic steatosis. 5. Aortic atherosclerosis.   Primary malignant neoplasm of breast with metastasis (HCC)  08/25/2019 Initial Diagnosis   Primary malignant neoplasm of  breast with metastasis (HCC)   10/11/2022 -  Chemotherapy   Patient is on Treatment Plan : BREAST METASTATIC Fam-Trastuzumab Deruxtecan-nxki  (Enhertu ) (5.4) q21d       CHIEF COMPLIANT: Cycle 19 Enhertu   HISTORY OF PRESENT ILLNESS:  History of Present Illness The patient, with a history of cancer, presents for a follow-up visit. She reports a significant improvement in her quality of life after moving to a new residence closer to her workplace, which has reduced her stress levels and commuting costs. She also mentions a positive change in her mood and overall happiness, as noticed by a colleague.  Regarding her treatment, she reports tolerating it well and inquires about the duration of her current regimen. She also expresses concern about a small spot in the brain detected in a previous MRI, for which she has a follow-up appointment scheduled in June.  In addition, she mentions a potential interaction with her new neighbors, who are of [place] descent and known to host a Financial planner. The patient expresses interest in attending this event and experiencing authentic [place] cuisine.     ALLERGIES:  is allergic to dilaudid  [hydromorphone ] and codeine.  MEDICATIONS:  Current Outpatient Medications  Medication Sig Dispense Refill   ALPRAZolam  (XANAX ) 0.25 MG tablet Take 1 tablet (0.25 mg total) by mouth at bedtime as needed for anxiety. 30 tablet 3   aspirin  EC 81 MG tablet Take 1 tablet (81 mg total) by mouth daily. Swallow whole. 90 tablet 3   aspirin -acetaminophen -caffeine (EXCEDRIN MIGRAINE) 250-250-65 MG tablet Take 2 tablets by mouth every 6 (six) hours as needed for headache.     CALCIUM  PO Take 1,200 mg by mouth 2 (two) times daily.     dexamethasone  (DECADRON ) 4 MG tablet Take 1 tablets (4 mg) by mouth daily for 3 days after chemotherapy. Take with food. 30 tablet 1   esomeprazole (NEXIUM) 40 MG capsule Take 40 mg by mouth daily.     levofloxacin  (LEVAQUIN ) 750 MG tablet Take  1 tablet (750 mg total) by mouth daily. 7 tablet 0   lidocaine -prilocaine  (EMLA ) cream Apply to affected area once 30 g 3   loperamide (IMODIUM) 2 MG capsule Take 2 mg by mouth daily as needed for diarrhea or loose stools. Take 2 tablets at first diarrhea and then 1 tablet after additional loose stool. Max number in 24 hours is 8 tablets.     loratadine (CLARITIN) 10 MG tablet Take 10 mg by mouth daily.     meloxicam  (MOBIC ) 15 MG tablet TAKE 1 TABLET(15 MG) BY MOUTH DAILY AS NEEDED FOR PAIN 30 tablet 1   ondansetron  (ZOFRAN ) 8 MG tablet Take 1 tablet (8 mg total) by mouth every 8 (eight) hours as needed for nausea or vomiting. Start on the third day after chemotherapy. 30 tablet 1   prochlorperazine  (COMPAZINE ) 10 MG tablet Take 1 tablet (10 mg total) by mouth every 6 (six) hours as needed for nausea or vomiting. 30 tablet 1   rosuvastatin  (CRESTOR ) 10 MG tablet Take 1 tablet (10 mg total) by mouth daily. 90 tablet 3   traMADol  (ULTRAM ) 50 MG tablet Take 1 tablet (50 mg total) by mouth every 6 (six) hours as needed. 10 tablet 0   traZODone  (DESYREL ) 50 MG tablet TAKE 1 TABLET(50 MG) BY MOUTH AT BEDTIME 90 tablet 3  venlafaxine  XR (EFFEXOR -XR) 75 MG 24 hr capsule TAKE 1 CAPSULE(75 MG) BY MOUTH AT BEDTIME 90 capsule 3   dicyclomine  (BENTYL ) 20 MG tablet Take 1 tablet (20 mg total) by mouth every 6 (six) hours as needed for up to 7 days for spasms. 28 tablet 0   No current facility-administered medications for this visit.    PHYSICAL EXAMINATION: ECOG PERFORMANCE STATUS: 1 - Symptomatic but completely ambulatory  Vitals:   11/29/23 1308  BP: 124/88  Pulse: 90  Resp: 18  Temp: 97.6 F (36.4 C)  SpO2: 98%   Filed Weights   11/29/23 1308  Weight: 228 lb 6.4 oz (103.6 kg)    LABORATORY DATA:  I have reviewed the data as listed    Latest Ref Rng & Units 11/29/2023   12:24 PM 11/01/2023    2:57 PM 10/11/2023    2:41 PM  CMP  Glucose 70 - 99 mg/dL 272  536  644   BUN 6 - 20 mg/dL 11  14   7    Creatinine 0.44 - 1.00 mg/dL 0.34  7.42  5.95   Sodium 135 - 145 mmol/L 141  141  143   Potassium 3.5 - 5.1 mmol/L 4.1  4.0  3.4   Chloride 98 - 111 mmol/L 106  106  112   CO2 22 - 32 mmol/L 29  31  26    Calcium  8.9 - 10.3 mg/dL 9.1  8.9  7.0   Total Protein 6.5 - 8.1 g/dL 6.8  6.7  5.7   Total Bilirubin 0.0 - 1.2 mg/dL 0.5  0.3  0.3   Alkaline Phos 38 - 126 U/L 69  72  63   AST 15 - 41 U/L 33  30  24   ALT 0 - 44 U/L 38  37  28     Lab Results  Component Value Date   WBC 3.5 (L) 11/29/2023   HGB 12.4 11/29/2023   HCT 37.3 11/29/2023   MCV 91.4 11/29/2023   PLT 245 11/29/2023   NEUTROABS 1.8 11/29/2023    ASSESSMENT & PLAN:  Cancer of central portion of left female breast (HCC) 08/22/2019: PET CT scan: Metastatic disease in the chest with multiple lung nodules all subcentimeter size and hypermetabolic lymph nodes (right paratracheal node 7 mm, subcarinal node 1 cm, right hilar node 6 mm), retroperitoneal and upper pelvic common iliac lymph nodes, bone metastases especially L5, L4, T6, right 10th rib, left proximal femur   Bone biopsy was not felt to be reliable in addition to the effect of radiation on the bone. Palliative radiation completed 09/03/2019 [facility]: BRCA1 mutation, TMB 5.36, MSI: Normal   Prior treatment: Ibrance  with letrozole  and Xgeva for bone metastases switched to abemaciclib  and Fulvestrant    Bone metastasis: Patient does not want to take bisphosphonates CT CAP 08/23/2022: New and enlarged pulmonary nodule consistent with progression.  Enlarged small low cervical and mediastinal nodes.  Multiple bone metastases minimally progressive.  Liver lesions evaluation is challenging because of hepatic steatosis with suspected liver progression, isolated tiny omental nodule   Ultrasound left supraclavicular biopsy 09/22/2022: Metastatic breast cancer ER 90%, PR 0%, Ki-67 60%, HER2 1+ Brain MRI: 10/03/2022: Small foci of enhancement in the right central sulcus and  within an anterior right frontal sulcus concerning for leptomeningeal disease.  Skull metastases, abnormal enhancement left globe  ---------------------------------------------------------------- Current treatment:: Enhertu  cycle 19 (now every 4 weeks) Enhertu  toxicities: Fatigue    CT CAP 10/11/2023: Continued decrease in the pulmonary  metastases, no significant change in the liver lesions, unchanged bone metastases MRI brain 10/18/2023: Punctate focus of enhancement right frontal operculum, unchanged enhancing leptomeningeal metastatic disease within the right central sulcus, unchanged enhancing pachymeningeal metastatic disease high right frontal lobe, punctate focus of enhancement left temporal lobe unchanged, calvarial metastasis   Dr. Mark Sil recommended continued MRI surveillance  Return to clinic every 4-week for Enhertu  treatments. ------------------------------------- Assessment and Plan Assessment & Plan Malignant neoplasm of central portion of left breast Undergoing treatment with optimal lab results. Previous MRI showed a small brain lesion; follow-up MRI scheduled in June. Current treatment plan effective; no indication to alter it. Research supports indefinite continuation if effective. - Continue current treatment regimen. - Schedule follow-up MRI in June to monitor brain lesion.  Hypocalcemia Calcium  levels returned to normal. Current level pending.  Anxiety Anxiety well-managed. Reports feeling happier and less stressed after recent life changes. - Continue current management for anxiety.      No orders of the defined types were placed in this encounter.  The patient has a good understanding of the overall plan. she agrees with it. she will call with any problems that may develop before the next visit here. Total time spent: 30 mins including face to face time and time spent for planning, charting and co-ordination of care   Viinay K Makenze Ellett, MD 11/29/23

## 2023-12-04 ENCOUNTER — Encounter: Payer: Self-pay | Admitting: Hematology and Oncology

## 2023-12-05 ENCOUNTER — Other Ambulatory Visit: Payer: Self-pay | Admitting: Radiation Therapy

## 2023-12-13 ENCOUNTER — Ambulatory Visit

## 2023-12-13 ENCOUNTER — Ambulatory Visit: Admitting: Hematology and Oncology

## 2023-12-13 ENCOUNTER — Other Ambulatory Visit

## 2023-12-20 ENCOUNTER — Encounter: Payer: Self-pay | Admitting: Hematology and Oncology

## 2023-12-24 ENCOUNTER — Ambulatory Visit (HOSPITAL_COMMUNITY)
Admission: RE | Admit: 2023-12-24 | Discharge: 2023-12-24 | Disposition: A | Discharge status: Home / Self Care | Source: Ambulatory Visit | Attending: Internal Medicine | Admitting: Internal Medicine

## 2023-12-24 DIAGNOSIS — I5030 Unspecified diastolic (congestive) heart failure: Secondary | ICD-10-CM | POA: Diagnosis not present

## 2023-12-24 DIAGNOSIS — Z79899 Other long term (current) drug therapy: Secondary | ICD-10-CM | POA: Diagnosis not present

## 2023-12-24 DIAGNOSIS — Z5181 Encounter for therapeutic drug level monitoring: Secondary | ICD-10-CM | POA: Insufficient documentation

## 2023-12-24 LAB — ECHOCARDIOGRAM COMPLETE
Area-P 1/2: 3.08 cm2
S' Lateral: 2.9 cm

## 2023-12-27 ENCOUNTER — Inpatient Hospital Stay

## 2023-12-27 ENCOUNTER — Inpatient Hospital Stay: Attending: Hematology and Oncology | Admitting: Hematology and Oncology

## 2023-12-27 ENCOUNTER — Other Ambulatory Visit

## 2023-12-27 ENCOUNTER — Ambulatory Visit: Admitting: Hematology and Oncology

## 2023-12-27 ENCOUNTER — Ambulatory Visit

## 2023-12-27 VITALS — BP 127/82 | HR 85 | Temp 97.4°F | Resp 18 | Ht 64.0 in | Wt 231.4 lb

## 2023-12-27 DIAGNOSIS — Z7982 Long term (current) use of aspirin: Secondary | ICD-10-CM | POA: Diagnosis not present

## 2023-12-27 DIAGNOSIS — Z95828 Presence of other vascular implants and grafts: Secondary | ICD-10-CM

## 2023-12-27 DIAGNOSIS — C787 Secondary malignant neoplasm of liver and intrahepatic bile duct: Secondary | ICD-10-CM | POA: Diagnosis not present

## 2023-12-27 DIAGNOSIS — C778 Secondary and unspecified malignant neoplasm of lymph nodes of multiple regions: Secondary | ICD-10-CM | POA: Diagnosis not present

## 2023-12-27 DIAGNOSIS — Z9221 Personal history of antineoplastic chemotherapy: Secondary | ICD-10-CM | POA: Insufficient documentation

## 2023-12-27 DIAGNOSIS — Z85828 Personal history of other malignant neoplasm of skin: Secondary | ICD-10-CM | POA: Insufficient documentation

## 2023-12-27 DIAGNOSIS — C7951 Secondary malignant neoplasm of bone: Secondary | ICD-10-CM | POA: Insufficient documentation

## 2023-12-27 DIAGNOSIS — C50112 Malignant neoplasm of central portion of left female breast: Secondary | ICD-10-CM | POA: Insufficient documentation

## 2023-12-27 DIAGNOSIS — Z923 Personal history of irradiation: Secondary | ICD-10-CM | POA: Diagnosis not present

## 2023-12-27 DIAGNOSIS — I700 Atherosclerosis of aorta: Secondary | ICD-10-CM | POA: Diagnosis not present

## 2023-12-27 DIAGNOSIS — C7800 Secondary malignant neoplasm of unspecified lung: Secondary | ICD-10-CM | POA: Insufficient documentation

## 2023-12-27 DIAGNOSIS — Z7952 Long term (current) use of systemic steroids: Secondary | ICD-10-CM | POA: Diagnosis not present

## 2023-12-27 DIAGNOSIS — Z5112 Encounter for antineoplastic immunotherapy: Secondary | ICD-10-CM | POA: Insufficient documentation

## 2023-12-27 DIAGNOSIS — C50919 Malignant neoplasm of unspecified site of unspecified female breast: Secondary | ICD-10-CM

## 2023-12-27 DIAGNOSIS — K760 Fatty (change of) liver, not elsewhere classified: Secondary | ICD-10-CM | POA: Diagnosis not present

## 2023-12-27 DIAGNOSIS — Z170 Estrogen receptor positive status [ER+]: Secondary | ICD-10-CM | POA: Diagnosis not present

## 2023-12-27 DIAGNOSIS — Z1501 Genetic susceptibility to malignant neoplasm of breast: Secondary | ICD-10-CM | POA: Insufficient documentation

## 2023-12-27 DIAGNOSIS — Z79899 Other long term (current) drug therapy: Secondary | ICD-10-CM | POA: Insufficient documentation

## 2023-12-27 LAB — CMP (CANCER CENTER ONLY)
ALT: 36 U/L (ref 0–44)
AST: 29 U/L (ref 15–41)
Albumin: 4.2 g/dL (ref 3.5–5.0)
Alkaline Phosphatase: 71 U/L (ref 38–126)
Anion gap: 7 (ref 5–15)
BUN: 10 mg/dL (ref 6–20)
CO2: 30 mmol/L (ref 22–32)
Calcium: 9.1 mg/dL (ref 8.9–10.3)
Chloride: 105 mmol/L (ref 98–111)
Creatinine: 0.63 mg/dL (ref 0.44–1.00)
GFR, Estimated: >60 mL/min (ref >60)
Glucose, Bld: 89 mg/dL (ref 70–99)
Potassium: 4.2 mmol/L (ref 3.5–5.1)
Sodium: 142 mmol/L (ref 135–145)
Total Bilirubin: 0.4 mg/dL (ref 0.0–1.2)
Total Protein: 6.9 g/dL (ref 6.5–8.1)

## 2023-12-27 LAB — CBC WITH DIFFERENTIAL (CANCER CENTER ONLY)
Abs Immature Granulocytes: 0.01 10*3/uL (ref 0.00–0.07)
Basophils Absolute: 0 10*3/uL (ref 0.0–0.1)
Basophils Relative: 1 %
Eosinophils Absolute: 0.2 10*3/uL (ref 0.0–0.5)
Eosinophils Relative: 6 %
HCT: 37.6 % (ref 36.0–46.0)
Hemoglobin: 12.7 g/dL (ref 12.0–15.0)
Immature Granulocytes: 0 %
Lymphocytes Relative: 32 %
Lymphs Abs: 1.2 10*3/uL (ref 0.7–4.0)
MCH: 30.7 pg (ref 26.0–34.0)
MCHC: 33.8 g/dL (ref 30.0–36.0)
MCV: 90.8 fL (ref 80.0–100.0)
Monocytes Absolute: 0.4 10*3/uL (ref 0.1–1.0)
Monocytes Relative: 11 %
Neutro Abs: 1.9 10*3/uL (ref 1.7–7.7)
Neutrophils Relative %: 50 %
Platelet Count: 220 10*3/uL (ref 150–400)
RBC: 4.14 MIL/uL (ref 3.87–5.11)
RDW: 13.9 % (ref 11.5–15.5)
WBC Count: 3.8 10*3/uL — ABNORMAL LOW (ref 4.0–10.5)
nRBC: 0 % (ref 0.0–0.2)

## 2023-12-27 MED ORDER — SODIUM CHLORIDE 0.9 % IV SOLN
25.0000 mg | Freq: Once | INTRAVENOUS | Status: AC
  Administered 2023-12-27: 25 mg via INTRAVENOUS
  Filled 2023-12-27: qty 1

## 2023-12-27 MED ORDER — FOSAPREPITANT DIMEGLUMINE INJECTION 150 MG
150.0000 mg | Freq: Once | INTRAVENOUS | Status: AC
  Administered 2023-12-27: 150 mg via INTRAVENOUS
  Filled 2023-12-27: qty 150

## 2023-12-27 MED ORDER — DEXTROSE 5 % IV SOLN
Freq: Once | INTRAVENOUS | Status: AC
Start: 2023-12-27 — End: 2023-12-27

## 2023-12-27 MED ORDER — FAM-TRASTUZUMAB DERUXTECAN-NXKI CHEMO 100 MG IV SOLR
3.2000 mg/kg | Freq: Once | INTRAVENOUS | Status: AC
Start: 2023-12-27 — End: 2023-12-27
  Administered 2023-12-27: 300 mg via INTRAVENOUS
  Filled 2023-12-27: qty 15

## 2023-12-27 MED ORDER — SODIUM CHLORIDE 0.9% FLUSH: 10.0000 mL | INTRAVENOUS | Status: DC | PRN

## 2023-12-27 MED ORDER — SODIUM CHLORIDE 0.9% FLUSH
10.0000 mL | Freq: Once | INTRAVENOUS | Status: AC
  Administered 2023-12-27: 10 mL

## 2023-12-27 MED ORDER — ACETAMINOPHEN 325 MG PO TABS
650.0000 mg | ORAL_TABLET | Freq: Once | ORAL | Status: AC
  Administered 2023-12-27: 650 mg via ORAL
  Filled 2023-12-27: qty 2

## 2023-12-27 MED ORDER — DEXAMETHASONE SODIUM PHOSPHATE 10 MG/ML IJ SOLN
10.0000 mg | Freq: Once | INTRAMUSCULAR | Status: AC
  Administered 2023-12-27: 10 mg via INTRAVENOUS
  Filled 2023-12-27: qty 1

## 2023-12-27 MED ORDER — DIPHENHYDRAMINE HCL 25 MG PO CAPS
25.0000 mg | ORAL_CAPSULE | Freq: Once | ORAL | Status: AC
  Administered 2023-12-27: 25 mg via ORAL
  Filled 2023-12-27: qty 1

## 2023-12-27 MED ORDER — HEPARIN SOD (PORK) LOCK FLUSH 100 UNIT/ML IV SOLN: 500.0000 [IU] | Freq: Once | INTRAVENOUS | Status: DC | PRN

## 2023-12-27 NOTE — Patient Instructions (Signed)

## 2023-12-27 NOTE — Progress Notes (Signed)
 Patient Care Team: Patient, [name] Cameron Cea, MD as Medical Oncologist (Hematology and Oncology) Retta Caster, MD as Consulting Physician (Radiation Oncology) Debbie Fails Laura Polio, NP as Nurse Practitioner (Hematology and Oncology) [name] Harry, MD as Consulting Physician (General Surgery) Althea Atkinson, RPH-CPP as Pharmacist (Hematology and Oncology)  DIAGNOSIS:  Encounter Diagnoses  Name Primary?   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  BRCA1 [id number] VUS identified as well.  The report date is 04/02/2014.  UPDATE: BRCA1 [id number] VUS has been reclassified as a likely benign variant.  The reclassification date is 04/21/2020.   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 N0 M0 stage IA: Oncotype DX 13 (ROR 9%)   04/16/2014 Surgery   Left upper back melanoma resected no residual melanoma identified on re-resection margins negative   05/26/2014 - 06/08/2015 Anti-estrogen oral therapy   Tamoxifen  20 mg daily with a plan to switch her to aromatase inhibitors once she gets oophorectomy ( patient did not continue antiestrogen therapy by choice)   08/13/2014 Surgery   oophorectomy with total abdominal hysterectomy   09/08/2015 -  Anti-estrogen oral therapy   Resumed anastrozole  after she found recurrence of breast cancer; stopped in January 2018, started letrozole  2.5 mg daily 10/12/2016; switched to  Exemestane  25 mg daily on 01/03/17   09/16/2015 Surgery   Skin, left: IDC grade 3; 0.5 cm, margins negative, ER 95%, PR 10%, HER-2 positive ratio 2.08   11/08/2015 - 02/2017 Chemotherapy   Herceptin  every 3 weeks plus anastrozole    11/24/2015 - 01/14/2016 Radiation Therapy   Adjuvant radiation therapy by Dr. Eloise Hake   01/2017 -  Anti-estrogen oral therapy   Exemestane  daily (stopped others due to side effects), due to muscle aches and pains switch to tamoxifen  5 mg daily starting 12/18/2017   08/07/2019 Relapse/Recurrence   Bone scan: Sclerotic metastatic lesions involving left femoral neck, right ninth rib and entire L5 vertebra; MRI left hip: Diffuse signal abnormality L5 vertebral body with possible extraosseous extension   08/22/2019 - 09/03/2019 Radiation Therapy   Palliative radiation with Dr. Eloise Hake to L5 and left proixmal femur.    10/2021 Treatment Plan Change   Fulvestrant  and Verzenio    08/23/2022 Imaging   CT CAP 08/23/2022: New and enlarged pulmonary nodule consistent with progression.  Enlarged small low cervical and mediastinal nodes.  Multiple bone metastases minimally progressive.  Liver lesions evaluation is challenging because of hepatic steatosis with suspected liver progression, isolated tiny omental nodule     09/07/2022 PET scan   IMPRESSION: 1. Worsening of disease now with nodal, pulmonary and hepatic metastases. 2. Areas of variable FDG uptake with some new bone lesions and some areas of bony metastasis which are more focal, for instance in the pelvis where there was likely diffuse involvement on prior imaging. Many of the areas of sclerosis do not show substantial uptake as outlined above and some areas show improvement/resolution of FDG uptake seen previously but there are signs of active  bony metastatic disease currently. 3. Likely LEFT adrenal adenoma. 4. Severe hepatic steatosis. 5. Aortic atherosclerosis.   Primary malignant neoplasm of breast with metastasis  (HCC)  08/25/2019 Initial Diagnosis   Primary malignant neoplasm of breast with metastasis (HCC)   10/11/2022 -  Chemotherapy   Patient is on Treatment Plan : BREAST METASTATIC Fam-Trastuzumab Deruxtecan-nxki  (Enhertu ) (5.4) q21d       CHIEF COMPLIANT: Cycle 20 Enhertu   HISTORY OF PRESENT ILLNESS:   History of Present Illness Krystal [name] is a 58 year old female who presents with eye discomfort and follow-up on lung imaging.  She experiences sensations in her eye, described as fluttering and lightening colored, associated with increased work. A visible blood vessel is painful. Previous symptoms improved with medication. An MRI is scheduled for next month.  She undergoes CT scans every six months for lung issues, with the last scans in February or March showing a small new area. She is under regular surveillance for these concerns.  Recent blood work shows a white blood cell count of 3.8, with normal being 4. Hemoglobin levels have increased from 11.9 to 12.7. Platelets and neutrophils are in good condition. She awaits calcium  results, with previous levels at 9.1.     ALLERGIES:  is allergic to dilaudid  [hydromorphone ] and codeine.  MEDICATIONS:  Current Outpatient Medications  Medication Sig Dispense Refill   ALPRAZolam  (XANAX ) 0.25 MG tablet Take 1 tablet (0.25 mg total) by mouth at bedtime as needed for anxiety. 30 tablet 3   aspirin  EC 81 MG tablet Take 1 tablet (81 mg total) by mouth daily. Swallow whole. 90 tablet 3   aspirin -acetaminophen -caffeine (EXCEDRIN MIGRAINE) 250-250-65 MG tablet Take 2 tablets by mouth every 6 (six) hours as needed for headache.     CALCIUM  PO Take 1,200 mg by mouth 2 (two) times daily.     dexamethasone  (DECADRON ) 4 MG tablet Take 1 tablets (4 mg) by mouth daily for 3 days after chemotherapy. Take with food. 30 tablet 1   dicyclomine  (BENTYL ) 20 MG tablet Take 1 tablet (20 mg total) by mouth every 6 (six) hours as needed for up to 7 days for spasms. 28  tablet 0   esomeprazole (NEXIUM) 40 MG capsule Take 40 mg by mouth daily.     levofloxacin  (LEVAQUIN ) 750 MG tablet Take 1 tablet (750 mg total) by mouth daily. 7 tablet 0   lidocaine -prilocaine  (EMLA ) cream Apply to affected area once 30 g 3   loperamide (IMODIUM) 2 MG capsule Take 2 mg by mouth daily as needed for diarrhea or loose stools. Take 2 tablets at first diarrhea and then 1 tablet after additional loose stool. Max number in 24 hours is 8 tablets.     loratadine (CLARITIN) 10 MG tablet Take 10 mg by mouth daily.     meloxicam  (MOBIC ) 15 MG tablet TAKE 1 TABLET(15 MG) BY MOUTH DAILY AS NEEDED FOR PAIN 30 tablet 1   ondansetron  (ZOFRAN ) 8 MG tablet Take 1 tablet (8 mg total) by mouth every 8 (eight) hours as needed for nausea or vomiting. Start on the third day after chemotherapy. 30 tablet 1   prochlorperazine  (COMPAZINE ) 10 MG tablet Take 1 tablet (10 mg total) by mouth every 6 (six) hours as needed for nausea or vomiting. 30 tablet 1   rosuvastatin  (CRESTOR ) 10 MG tablet Take 1 tablet (10 mg total) by mouth daily. 90 tablet 3   traMADol  (ULTRAM ) 50 MG tablet Take 1 tablet (50 mg total) by mouth every 6 (  six) hours as needed. 10 tablet 0   traZODone  (DESYREL ) 50 MG tablet TAKE 1 TABLET(50 MG) BY MOUTH AT BEDTIME 90 tablet 3   venlafaxine  XR (EFFEXOR -XR) 75 MG 24 hr capsule TAKE 1 CAPSULE(75 MG) BY MOUTH AT BEDTIME 90 capsule 3   No current facility-administered medications for this visit.    PHYSICAL EXAMINATION: ECOG PERFORMANCE STATUS: 1 - Symptomatic but completely ambulatory  Vitals:   12/27/23 1441  BP: 127/82  Pulse: 85  Resp: 18  Temp: (!) 97.4 F (36.3 C)  SpO2: 100%   Filed Weights   12/27/23 1441  Weight: 231 lb 6.4 oz (105 kg)      LABORATORY DATA:  I have reviewed the data as listed    Latest Ref Rng & Units 11/29/2023   12:24 PM 11/01/2023    2:57 PM 10/11/2023    2:41 PM  CMP  Glucose 70 - 99 mg/dL 811  914  782   BUN 6 - 20 mg/dL 11  14  7     Creatinine 0.44 - 1.00 mg/dL 9.56  2.13  0.86   Sodium 135 - 145 mmol/L 141  141  143   Potassium 3.5 - 5.1 mmol/L 4.1  4.0  3.4   Chloride 98 - 111 mmol/L 106  106  112   CO2 22 - 32 mmol/L 29  31  26    Calcium  8.9 - 10.3 mg/dL 9.1  8.9  7.0   Total Protein 6.5 - 8.1 g/dL 6.8  6.7  5.7   Total Bilirubin 0.0 - 1.2 mg/dL 0.5  0.3  0.3   Alkaline Phos 38 - 126 U/L 69  72  63   AST 15 - 41 U/L 33  30  24   ALT 0 - 44 U/L 38  37  28     Lab Results  Component Value Date   WBC 3.8 (L) 12/27/2023   HGB 12.7 12/27/2023   HCT 37.6 12/27/2023   MCV 90.8 12/27/2023   PLT 220 12/27/2023   NEUTROABS 1.9 12/27/2023    ASSESSMENT & PLAN:  Cancer of central portion of left female breast (HCC) 08/22/2019: PET CT scan: Metastatic disease in the chest with multiple lung nodules all subcentimeter size and hypermetabolic lymph nodes (right paratracheal node 7 mm, subcarinal node 1 cm, right hilar node 6 mm), retroperitoneal and upper pelvic common iliac lymph nodes, bone metastases especially L5, L4, T6, right 10th rib, left proximal femur   Bone biopsy was not felt to be reliable in addition to the effect of radiation on the bone. Palliative radiation completed 09/03/2019 [facility] 360: BRCA1 mutation, TMB 5.36, MSI: Normal   Prior treatment: Ibrance  with letrozole  and Xgeva for bone metastases switched to abemaciclib  and Fulvestrant    Bone metastasis: Patient does not want to take bisphosphonates CT CAP 08/23/2022: New and enlarged pulmonary nodule consistent with progression.  Enlarged small low cervical and mediastinal nodes.  Multiple bone metastases minimally progressive.  Liver lesions evaluation is challenging because of hepatic steatosis with suspected liver progression, isolated tiny omental nodule   Ultrasound left supraclavicular biopsy 09/22/2022: Metastatic breast cancer ER 90%, PR 0%, Ki-67 60%, HER2 1+ Brain MRI: 10/03/2022: Small foci of enhancement in the right central sulcus and  within an anterior right frontal sulcus concerning for leptomeningeal disease.  Skull metastases, abnormal enhancement left globe  ---------------------------------------------------------------- Current treatment:: Enhertu  cycle 20 (now every 4 weeks) Enhertu  toxicities: Fatigue    CT CAP 10/11/2023: Continued decrease in the pulmonary metastases,  no significant change in the liver lesions, unchanged bone metastases MRI brain 10/18/2023: Punctate focus of enhancement right frontal operculum, unchanged enhancing leptomeningeal metastatic disease within the right central sulcus, unchanged enhancing pachymeningeal metastatic disease high right frontal lobe, punctate focus of enhancement left temporal lobe unchanged, calvarial metastasis   Dr. Mark Sil recommended continued MRI surveillance   Return to clinic every 4-week for Enhertu  treatments. We are doing scans every 6 months. ------------------------------------- Assessment and Plan Assessment & Plan Malignant neoplasm of central portion of left breast Undergoing regular CT monitoring for lung metastasis. Recent scans show a new small lesion without significant symptoms. Dissatisfied with current radiation oncologist due to communication issues. - Scheduled MRI next month. - Maintain CT scan schedule every six months. - Discuss potential switch to a different radiation oncologist.  Hypocalcemia Previous calcium  levels normal. Awaiting current results. - Await current calcium  level results.      Orders Placed This Encounter  Procedures   CBC with Differential (Cancer Center Only)    Standing Status:   Future    Expected Date:   01/24/2024    Expiration Date:   01/23/2025   CMP (Cancer Center only)    Standing Status:   Future    Expected Date:   01/24/2024    Expiration Date:   01/23/2025   CBC with Differential (Cancer Center Only)    Standing Status:   Future    Expected Date:   02/21/2024    Expiration Date:   02/20/2025   CMP  (Cancer Center only)    Standing Status:   Future    Expected Date:   02/21/2024    Expiration Date:   02/20/2025   CBC with Differential (Cancer Center Only)    Standing Status:   Future    Expected Date:   03/20/2024    Expiration Date:   03/20/2025   CMP (Cancer Center only)    Standing Status:   Future    Expected Date:   03/20/2024    Expiration Date:   03/20/2025   CBC with Differential (Cancer Center Only)    Standing Status:   Future    Expected Date:   04/17/2024    Expiration Date:   04/17/2025   CMP (Cancer Center only)    Standing Status:   Future    Expected Date:   04/17/2024    Expiration Date:   04/17/2025   CBC with Differential (Cancer Center Only)    Standing Status:   Future    Expected Date:   05/15/2024    Expiration Date:   05/15/2025   CMP (Cancer Center only)    Standing Status:   Future    Expected Date:   05/15/2024    Expiration Date:   05/15/2025   CBC with Differential (Cancer Center Only)    Standing Status:   Future    Expected Date:   06/12/2024    Expiration Date:   06/12/2025   CMP (Cancer Center only)    Standing Status:   Future    Expected Date:   06/12/2024    Expiration Date:   06/12/2025   CBC with Differential (Cancer Center Only)    Standing Status:   Future    Expected Date:   07/10/2024    Expiration Date:   07/10/2025   CMP (Cancer Center only)    Standing Status:   Future    Expected Date:   07/10/2024    Expiration Date:   07/10/2025  CBC with Differential (Cancer Center Only)    Standing Status:   Future    Expected Date:   08/07/2024    Expiration Date:   08/07/2025   CMP (Cancer Center only)    Standing Status:   Future    Expected Date:   08/07/2024    Expiration Date:   08/07/2025   The patient has a good understanding of the overall plan. she agrees with it. she will call with any problems that may develop before the next visit here. Total time spent: 30 mins including face to face time and time spent for planning, charting and  co-ordination of care   Viinay K Angelic Schnelle, MD 12/27/23

## 2023-12-27 NOTE — Assessment & Plan Note (Signed)
 08/22/2019: PET CT scan: Metastatic disease in the chest with multiple lung nodules all subcentimeter size and hypermetabolic lymph nodes (right paratracheal node 7 mm, subcarinal node 1 cm, right hilar node 6 mm), retroperitoneal and upper pelvic common iliac lymph nodes, bone metastases especially L5, L4, T6, right 10th rib, left proximal femur   Bone biopsy was not felt to be reliable in addition to the effect of radiation on the bone. Palliative radiation completed 09/03/2019 [facility]: BRCA1 mutation, TMB 5.36, MSI: Normal   Prior treatment: Ibrance  with letrozole  and Xgeva for bone metastases switched to abemaciclib  and Fulvestrant    Bone metastasis: Patient does not want to take bisphosphonates CT CAP 08/23/2022: New and enlarged pulmonary nodule consistent with progression.  Enlarged small low cervical and mediastinal nodes.  Multiple bone metastases minimally progressive.  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  Skull metastases, abnormal enhancement left globe  ---------------------------------------------------------------- Current treatment:: Enhertu  cycle 20 (now every 4 weeks) Enhertu  toxicities: Fatigue    CT CAP 10/11/2023: Continued decrease in the pulmonary metastases, no significant change in the liver lesions, unchanged bone metastases MRI brain 10/18/2023: Punctate focus of enhancement right frontal operculum, unchanged enhancing leptomeningeal metastatic disease within the right central sulcus, unchanged enhancing pachymeningeal metastatic disease high right frontal lobe, punctate focus of enhancement left temporal lobe  unchanged, calvarial metastasis   Dr. Mark Sil recommended continued MRI surveillance   Return to clinic every 4-week for Enhertu  treatments.

## 2023-12-28 ENCOUNTER — Other Ambulatory Visit: Payer: Self-pay

## 2023-12-30 ENCOUNTER — Other Ambulatory Visit: Payer: Self-pay

## 2024-01-02 ENCOUNTER — Telehealth: Payer: Self-pay

## 2024-01-02 NOTE — Telephone Encounter (Signed)
 Notified the pt regarding her FMLA forms being completed. They verbalized understanding. I emailed her hardcopy and also mailed it to her home address as requested. No questions or concerns at this time.

## 2024-01-11 ENCOUNTER — Telehealth: Payer: Self-pay | Admitting: Internal Medicine

## 2024-01-13 ENCOUNTER — Other Ambulatory Visit: Payer: Self-pay

## 2024-01-14 ENCOUNTER — Other Ambulatory Visit: Payer: Self-pay

## 2024-01-16 ENCOUNTER — Other Ambulatory Visit: Payer: Self-pay | Admitting: *Deleted

## 2024-01-16 DIAGNOSIS — H5712 Ocular pain, left eye: Secondary | ICD-10-CM

## 2024-01-16 NOTE — Progress Notes (Signed)
 Per pt request RN successfully faxed [phone])  referral to Dr. Ronelle Coffee with Ophthalmology for further evaluation and treatment of left eye pain.

## 2024-01-17 ENCOUNTER — Ambulatory Visit
Admission: RE | Admit: 2024-01-17 | Discharge: 2024-01-17 | Disposition: A | Discharge status: Home / Self Care | Source: Ambulatory Visit | Attending: Internal Medicine | Admitting: Internal Medicine

## 2024-01-17 ENCOUNTER — Encounter: Payer: Self-pay | Admitting: Hematology and Oncology

## 2024-01-17 DIAGNOSIS — C50919 Malignant neoplasm of unspecified site of unspecified female breast: Secondary | ICD-10-CM

## 2024-01-17 MED ORDER — GADOPICLENOL 0.5 MMOL/ML IV SOLN
7.0000 mL | Freq: Once | INTRAVENOUS | Status: AC | PRN
  Administered 2024-01-17: 7 mL via INTRAVENOUS

## 2024-01-21 ENCOUNTER — Inpatient Hospital Stay

## 2024-01-23 ENCOUNTER — Other Ambulatory Visit: Payer: Self-pay | Admitting: Hematology and Oncology

## 2024-01-24 ENCOUNTER — Ambulatory Visit

## 2024-01-24 ENCOUNTER — Other Ambulatory Visit

## 2024-01-24 ENCOUNTER — Inpatient Hospital Stay: Attending: Hematology and Oncology | Admitting: Internal Medicine

## 2024-01-24 DIAGNOSIS — C7931 Secondary malignant neoplasm of brain: Secondary | ICD-10-CM | POA: Diagnosis not present

## 2024-01-24 DIAGNOSIS — Z5112 Encounter for antineoplastic immunotherapy: Secondary | ICD-10-CM | POA: Insufficient documentation

## 2024-01-24 DIAGNOSIS — C50919 Malignant neoplasm of unspecified site of unspecified female breast: Secondary | ICD-10-CM

## 2024-01-24 DIAGNOSIS — R200 Anesthesia of skin: Secondary | ICD-10-CM | POA: Insufficient documentation

## 2024-01-24 DIAGNOSIS — C50112 Malignant neoplasm of central portion of left female breast: Secondary | ICD-10-CM | POA: Insufficient documentation

## 2024-01-24 DIAGNOSIS — C7951 Secondary malignant neoplasm of bone: Secondary | ICD-10-CM | POA: Insufficient documentation

## 2024-01-24 MED FILL — Fosaprepitant Dimeglumine For IV Infusion 150 MG (Base Eq): INTRAVENOUS | Qty: 5 | Status: AC

## 2024-01-24 NOTE — Progress Notes (Signed)
 I connected with Krystal [name] on 01/24/24 at 11:00 AM EDT by telephone visit and verified that I am speaking with the correct person using two identifiers.  I discussed the limitations, risks, security and privacy concerns of performing an evaluation and management service by telemedicine and the availability of in-person appointments. I also discussed with the patient that there may be a patient responsible charge related to this service. The patient expressed understanding and agreed to proceed.   Other persons participating in the visit and their role in the encounter:  n/a   Patient's location:  Home Provider's location:  Office Chief Complaint:  Breast cancer metastasized to brain, unspecified laterality (HCC)  History of Present Ilness: Krystal [name] reports no clinical changes today.  Continues to have some flashing visual symptoms in her left eye.  No change with facial numbness.  Otherwise independent with ADL's.  Continues on Enhertu  with Dr. Gudena.  Observations: Language and cognition at baseline  Imaging:  [facility] Clinician Interpretation: I have personally reviewed the CNS images as listed.  My interpretation, in the context of the patient's clinical presentation, is stable disease  MR BRAIN W WO CONTRAST Result Date: 01/18/2024 CLINICAL DATA:  Provided history: Breast cancer metastasized to brain, unspecified laterality. Brain/CNS neoplasm, assess treatment response. EXAM: MRI HEAD WITHOUT AND WITH CONTRAST TECHNIQUE: Multiplanar, multiecho pulse sequences of the brain and surrounding structures were obtained without and with intravenous contrast. CONTRAST:  7 mL open view COMPARISON:  Prior brain MRI examinations 10/16/2023 and earlier. FINDINGS: Brain: Cerebral volume is normal. Unchanged leptomeningeal enhancement along the right central sulcus (series 13, image 129). Unchanged pachymeningeal enhancement overlying the high right frontal lobe (for instance, as seen on series 15,  image 30). Unchanged punctate focus of enhancement at the anterior left temporal lobe, indeterminate for a metastasis versus vascular enhancement (series 13, image 61). A punctate focus of enhancement previously demonstrated at the right frontal operculum is not appreciated on today's examination. Unchanged punctate precontrast T1 hyperintense lesion within the left periatrial white matter (series 11, image 72). Precontrast T1 hyperintensity limits evaluation for enhancement at this site. In retrospect, this has been present dating back to the brain MRI of 04/13/2023. Slight interval increase in size of a 3 mm precontrast T1 hyperintense lesion in the right cerebellar hemisphere (series 11, image 23). Precontrast T1 hyperintensity limits evaluation for enhancement at this site. Multifocal T2 FLAIR hyperintense signal abnormality within the cerebral white matter, nonspecific but compatible with mild chronic small vessel ischemic disease. Numerous tiny foci of chronic hemosiderin deposition again demonstrated within the supratentorial brain. Partially empty sella turcica. There is no acute infarct. No extra-axial fluid collection. No midline shift. Vascular: Maintained flow voids within the proximal large arterial vessels. Skull and upper cervical spine: Unchanged sclerotic calvarial metastases. Unchanged 6 mm T2 hyperintense and enhancing lesion within the left parietal calvarium, indeterminate but potentially reflecting an additional metastasis. Sinuses/Orbits: No mass or acute finding within the imaged orbits. No significant paranasal sinus disease. Other: Persistent left mastoid effusion. Impression #1 will be called to the ordering clinician or representative by the Radiologist Assistant, and communication documented in the PACS or [employer]. IMPRESSION: 1. Slight interval increase in size of a 3 mm precontrast T1 hyperintense lesion within the right cerebellar hemisphere. Precontrast T1 hyperintense  signal limits evaluation for enhancement at this site. Short-interval MRI follow-up is recommended. 2. A previously demonstrated punctate enhancing focus at the right frontal operculum is not appreciated on today's exam. 3. Unchanged punctate a  precontrast T1 hyperintense lesion within the left periatrial white matter. Precontrast T1 hyperintensity limits evaluation for enhancement at this site. 4. Unchanged punctate focus of enhancement at the anterior left temporal lobe, indeterminate for a metastasis versus vascular enhancement. 5. 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 6. Unchanged pachymeningeal metastatic disease overlying the high right frontal lobe. 7. Unchanged calvarial metastases. Electronically Signed   By: Bascom Lily D.O.   On: 01/18/2024 08:43   Assessment and Plan: Breast cancer metastasized to brain, unspecified laterality (HCC)  Clinically stable today.  MRI brain demonstrates small focus of enhancement within right cerebellar hemisphere which is slightly larger.  This is of uncertain etiology, we will recommend continued surveillance only.  No other findings concerning for disease progression in CNS, Enhertu  has been effective overall.  Follow Up Instructions: We ask that Krystal [name] return to clinic in 3 months following next brain MRI, or sooner as needed.  I discussed the assessment and treatment plan with the patient.  The patient was provided an opportunity to ask questions and all were answered.  The patient agreed with the plan and demonstrated understanding of the instructions.    The patient was advised to call back or seek an in-person evaluation if the symptoms worsen or if the condition fails to improve as anticipated.    Krystal Stutz K Kandy Towery, MD   I provided 20 minutes of non face-to-face telephone visit time during this encounter, and > 50% was spent counseling as documented under my assessment & plan.

## 2024-01-25 ENCOUNTER — Inpatient Hospital Stay

## 2024-01-25 ENCOUNTER — Inpatient Hospital Stay: Admitting: Adult Health

## 2024-01-25 ENCOUNTER — Other Ambulatory Visit: Payer: Self-pay

## 2024-01-25 ENCOUNTER — Inpatient Hospital Stay: Attending: Hematology and Oncology

## 2024-01-25 VITALS — BP 122/77 | HR 82 | Temp 98.0°F | Resp 18 | Wt 228.4 lb

## 2024-01-25 DIAGNOSIS — C50919 Malignant neoplasm of unspecified site of unspecified female breast: Secondary | ICD-10-CM

## 2024-01-25 DIAGNOSIS — C7951 Secondary malignant neoplasm of bone: Secondary | ICD-10-CM | POA: Diagnosis not present

## 2024-01-25 DIAGNOSIS — Z95828 Presence of other vascular implants and grafts: Secondary | ICD-10-CM

## 2024-01-25 DIAGNOSIS — C50112 Malignant neoplasm of central portion of left female breast: Secondary | ICD-10-CM | POA: Diagnosis not present

## 2024-01-25 DIAGNOSIS — Z5112 Encounter for antineoplastic immunotherapy: Secondary | ICD-10-CM | POA: Diagnosis not present

## 2024-01-25 DIAGNOSIS — C7931 Secondary malignant neoplasm of brain: Secondary | ICD-10-CM | POA: Diagnosis present

## 2024-01-25 DIAGNOSIS — R200 Anesthesia of skin: Secondary | ICD-10-CM | POA: Diagnosis not present

## 2024-01-25 LAB — CMP (CANCER CENTER ONLY)
ALT: 38 U/L (ref 0–44)
AST: 29 U/L (ref 15–41)
Albumin: 4.2 g/dL (ref 3.5–5.0)
Alkaline Phosphatase: 75 U/L (ref 38–126)
Anion gap: 5 (ref 5–15)
BUN: 10 mg/dL (ref 6–20)
CO2: 32 mmol/L (ref 22–32)
Calcium: 9.1 mg/dL (ref 8.9–10.3)
Chloride: 104 mmol/L (ref 98–111)
Creatinine: 0.67 mg/dL (ref 0.44–1.00)
GFR, Estimated: >60 mL/min (ref >60)
Glucose, Bld: 156 mg/dL — ABNORMAL HIGH (ref 70–99)
Potassium: 4.5 mmol/L (ref 3.5–5.1)
Sodium: 141 mmol/L (ref 135–145)
Total Bilirubin: 0.3 mg/dL (ref 0.0–1.2)
Total Protein: 7 g/dL (ref 6.5–8.1)

## 2024-01-25 LAB — CBC WITH DIFFERENTIAL (CANCER CENTER ONLY)
Abs Immature Granulocytes: 0.01 10*3/uL (ref 0.00–0.07)
Basophils Absolute: 0 10*3/uL (ref 0.0–0.1)
Basophils Relative: 1 %
Eosinophils Absolute: 0.2 10*3/uL (ref 0.0–0.5)
Eosinophils Relative: 6 %
HCT: 38.1 % (ref 36.0–46.0)
Hemoglobin: 12.7 g/dL (ref 12.0–15.0)
Immature Granulocytes: 0 %
Lymphocytes Relative: 25 %
Lymphs Abs: 1 10*3/uL (ref 0.7–4.0)
MCH: 30.7 pg (ref 26.0–34.0)
MCHC: 33.3 g/dL (ref 30.0–36.0)
MCV: 92 fL (ref 80.0–100.0)
Monocytes Absolute: 0.4 10*3/uL (ref 0.1–1.0)
Monocytes Relative: 9 %
Neutro Abs: 2.3 10*3/uL (ref 1.7–7.7)
Neutrophils Relative %: 59 %
Platelet Count: 216 10*3/uL (ref 150–400)
RBC: 4.14 MIL/uL (ref 3.87–5.11)
RDW: 13.7 % (ref 11.5–15.5)
WBC Count: 4 10*3/uL (ref 4.0–10.5)
nRBC: 0 % (ref 0.0–0.2)

## 2024-01-25 MED ORDER — ALTEPLASE 2 MG IJ SOLR
2.0000 mg | Freq: Once | INTRAMUSCULAR | Status: AC
  Administered 2024-01-25: 2 mg
  Filled 2024-01-25: qty 2

## 2024-01-25 MED ORDER — SODIUM CHLORIDE 0.9% FLUSH
10.0000 mL | INTRAVENOUS | Status: DC | PRN
  Administered 2024-01-25: 10 mL

## 2024-01-25 MED ORDER — FAM-TRASTUZUMAB DERUXTECAN-NXKI CHEMO 100 MG IV SOLR
3.2000 mg/kg | Freq: Once | INTRAVENOUS | Status: AC
  Administered 2024-01-25: 300 mg via INTRAVENOUS
  Filled 2024-01-25: qty 15

## 2024-01-25 MED ORDER — DEXAMETHASONE SODIUM PHOSPHATE 10 MG/ML IJ SOLN
10.0000 mg | Freq: Once | INTRAMUSCULAR | Status: AC
  Administered 2024-01-25: 10 mg via INTRAVENOUS
  Filled 2024-01-25: qty 1

## 2024-01-25 MED ORDER — HEPARIN SOD (PORK) LOCK FLUSH 100 UNIT/ML IV SOLN
500.0000 [IU] | Freq: Once | INTRAVENOUS | Status: AC | PRN
  Administered 2024-01-25: 500 [IU]

## 2024-01-25 MED ORDER — ACETAMINOPHEN 325 MG PO TABS
650.0000 mg | ORAL_TABLET | Freq: Once | ORAL | Status: AC
  Administered 2024-01-25: 650 mg via ORAL
  Filled 2024-01-25: qty 2

## 2024-01-25 MED ORDER — DIPHENHYDRAMINE HCL 25 MG PO CAPS
25.0000 mg | ORAL_CAPSULE | Freq: Once | ORAL | Status: AC
  Administered 2024-01-25: 25 mg via ORAL
  Filled 2024-01-25: qty 1

## 2024-01-25 MED ORDER — SODIUM CHLORIDE 0.9% FLUSH
10.0000 mL | Freq: Once | INTRAVENOUS | Status: AC
  Administered 2024-01-25: 10 mL

## 2024-01-25 MED ORDER — PROMETHAZINE HCL 25 MG PO TABS
25.0000 mg | ORAL_TABLET | Freq: Once | ORAL | Status: AC
  Administered 2024-01-25: 25 mg via ORAL
  Filled 2024-01-25: qty 1

## 2024-01-25 MED ORDER — APREPITANT 130 MG/18ML IV EMUL
130.0000 mg | Freq: Once | INTRAVENOUS | Status: AC
  Administered 2024-01-25: 130 mg via INTRAVENOUS
  Filled 2024-01-25: qty 18

## 2024-01-25 MED ORDER — DEXTROSE 5 % IV SOLN: Freq: Once | INTRAVENOUS | Status: AC

## 2024-01-25 NOTE — Patient Instructions (Signed)

## 2024-01-25 NOTE — Progress Notes (Signed)
 Per Alwin Baars, NP, promethazine  has been changed from IV to PO, and Cinvanti has been substituted for Emend to reduce chair time per nursing's request. Cinvanti is approved by insurance per PA team.  Zykeem Bauserman, PharmD, MBA

## 2024-01-26 ENCOUNTER — Other Ambulatory Visit: Payer: Self-pay

## 2024-01-29 ENCOUNTER — Telehealth: Payer: Self-pay | Admitting: *Deleted

## 2024-01-29 NOTE — Telephone Encounter (Signed)
 Received call from pt with complaint of vision change in the left eye.  Pt states she is seeing flashing lights out of left eye that is constant x3 weeks.  Pt states she is also experiencing headaches and is concerned.  Pt states she is scheduled to see a retinal specialist on July 2nd.  MD out of office this afternoon, RN will review with MD in AM for further recommendations.

## 2024-01-30 ENCOUNTER — Encounter: Payer: Self-pay | Admitting: Hematology and Oncology

## 2024-01-30 ENCOUNTER — Other Ambulatory Visit: Payer: Self-pay | Admitting: Hematology and Oncology

## 2024-01-30 NOTE — Progress Notes (Signed)
 Telephone call - Sensation that right reflecting on water surface: Patient to see ophthalmologist. -Nausea since last treatment emend  was discontinued and aprepitant  was given.  I will switch her back to Emend .

## 2024-02-06 ENCOUNTER — Encounter: Payer: Self-pay | Admitting: Hematology and Oncology

## 2024-02-06 ENCOUNTER — Inpatient Hospital Stay: Attending: Hematology and Oncology | Admitting: Hematology and Oncology

## 2024-02-06 VITALS — BP 130/80 | HR 86 | Temp 98.6°F | Resp 18 | Ht 64.0 in | Wt 230.6 lb

## 2024-02-06 DIAGNOSIS — Z7982 Long term (current) use of aspirin: Secondary | ICD-10-CM | POA: Diagnosis not present

## 2024-02-06 DIAGNOSIS — Z79899 Other long term (current) drug therapy: Secondary | ICD-10-CM | POA: Diagnosis not present

## 2024-02-06 DIAGNOSIS — Z9221 Personal history of antineoplastic chemotherapy: Secondary | ICD-10-CM | POA: Insufficient documentation

## 2024-02-06 DIAGNOSIS — Z79811 Long term (current) use of aromatase inhibitors: Secondary | ICD-10-CM | POA: Insufficient documentation

## 2024-02-06 DIAGNOSIS — C7931 Secondary malignant neoplasm of brain: Secondary | ICD-10-CM | POA: Insufficient documentation

## 2024-02-06 DIAGNOSIS — Z170 Estrogen receptor positive status [ER+]: Secondary | ICD-10-CM | POA: Insufficient documentation

## 2024-02-06 DIAGNOSIS — Z923 Personal history of irradiation: Secondary | ICD-10-CM | POA: Diagnosis not present

## 2024-02-06 DIAGNOSIS — D649 Anemia, unspecified: Secondary | ICD-10-CM | POA: Insufficient documentation

## 2024-02-06 DIAGNOSIS — Z9013 Acquired absence of bilateral breasts and nipples: Secondary | ICD-10-CM | POA: Insufficient documentation

## 2024-02-06 DIAGNOSIS — R220 Localized swelling, mass and lump, head: Secondary | ICD-10-CM | POA: Insufficient documentation

## 2024-02-06 DIAGNOSIS — H43399 Other vitreous opacities, unspecified eye: Secondary | ICD-10-CM | POA: Diagnosis not present

## 2024-02-06 DIAGNOSIS — C778 Secondary and unspecified malignant neoplasm of lymph nodes of multiple regions: Secondary | ICD-10-CM | POA: Diagnosis not present

## 2024-02-06 DIAGNOSIS — C50112 Malignant neoplasm of central portion of left female breast: Secondary | ICD-10-CM | POA: Insufficient documentation

## 2024-02-06 DIAGNOSIS — C787 Secondary malignant neoplasm of liver and intrahepatic bile duct: Secondary | ICD-10-CM | POA: Diagnosis not present

## 2024-02-06 DIAGNOSIS — Z1501 Genetic susceptibility to malignant neoplasm of breast: Secondary | ICD-10-CM | POA: Insufficient documentation

## 2024-02-06 DIAGNOSIS — Z5112 Encounter for antineoplastic immunotherapy: Secondary | ICD-10-CM | POA: Insufficient documentation

## 2024-02-06 DIAGNOSIS — K760 Fatty (change of) liver, not elsewhere classified: Secondary | ICD-10-CM | POA: Insufficient documentation

## 2024-02-06 DIAGNOSIS — C7951 Secondary malignant neoplasm of bone: Secondary | ICD-10-CM | POA: Insufficient documentation

## 2024-02-06 DIAGNOSIS — I700 Atherosclerosis of aorta: Secondary | ICD-10-CM | POA: Diagnosis not present

## 2024-02-06 LAB — HM DIABETES EYE EXAM

## 2024-02-06 NOTE — Progress Notes (Signed)
 Patient Care Team: Patient, [name] Krystal Potts, MD as Medical Oncologist (Hematology and Oncology) Krystal Agent, MD as Consulting Physician (Radiation Oncology) Krystal Morna Pickle, NP as Nurse Practitioner (Hematology and Oncology) Krystal Cough, MD as Consulting Physician (General Surgery) Krystal [name] LABOR, RPH-CPP as Pharmacist (Hematology and Oncology)  DIAGNOSIS:  Encounter Diagnosis  Name Primary?   Malignant neoplasm of central portion of left breast in female, estrogen receptor positive (HCC) Yes    SUMMARY OF ONCOLOGIC HISTORY: Oncology History  Cancer of central portion of left female breast (HCC)  04/02/2014 Genetic Testing   BRCA1 [id number] pathogenic mutation identified on gene sequce and del/dup analyses of BRCA1 and BRCA2.  BRCA1 [id number] VUS identified as well.  The report date is 04/02/2014.  UPDATE: BRCA1 [id number] VUS has been reclassified as a likely benign variant.  The reclassification date is 04/21/2020.   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 N0 M0 stage IA: Oncotype DX 13 (ROR 9%)   04/16/2014 Surgery   Left upper back melanoma resected no residual melanoma identified on re-resection margins negative   05/26/2014 - 06/08/2015 Anti-estrogen oral therapy   Tamoxifen  20 mg daily with a plan to switch her to aromatase inhibitors once she gets oophorectomy ( patient did not continue antiestrogen therapy by choice)   08/13/2014 Surgery   oophorectomy with total abdominal hysterectomy   09/08/2015 -  Anti-estrogen oral therapy   Resumed anastrozole  after she found recurrence of breast cancer; stopped in January 2018, started letrozole  2.5 mg daily 10/12/2016; switched to Exemestane  25 mg daily on 01/03/17   09/16/2015 Surgery   Skin, left:  IDC grade 3; 0.5 cm, margins negative, ER 95%, PR 10%, HER-2 positive ratio 2.08   11/08/2015 - 02/2017 Chemotherapy   Herceptin  every 3 weeks plus anastrozole    11/24/2015 - 01/14/2016 Radiation Therapy   Adjuvant radiation therapy by Dr. Shannon   01/2017 -  Anti-estrogen oral therapy   Exemestane  daily (stopped others due to side effects), due to muscle aches and pains switch to tamoxifen  5 mg daily starting 12/18/2017   08/07/2019 Relapse/Recurrence   Bone scan: Sclerotic metastatic lesions involving left femoral neck, right ninth rib and entire L5 vertebra; MRI left hip: Diffuse signal abnormality L5 vertebral body with possible extraosseous extension   08/22/2019 - 09/03/2019 Radiation Therapy   Palliative radiation with Dr. Shannon to L5 and left proixmal femur.    10/2021 Treatment Plan Change   Fulvestrant  and Verzenio    08/23/2022 Imaging   CT CAP 08/23/2022: New and enlarged pulmonary nodule consistent with progression.  Enlarged small low cervical and mediastinal nodes.  Multiple bone metastases minimally progressive.  Liver lesions evaluation is challenging because of hepatic steatosis with suspected liver progression, isolated tiny omental nodule     09/07/2022 PET scan   IMPRESSION: 1. Worsening of disease now with nodal, pulmonary and hepatic metastases. 2. Areas of variable FDG uptake with some new bone lesions and some areas of bony metastasis which are more focal, for instance in the pelvis where there was likely diffuse involvement on prior imaging. Many of the areas of sclerosis do not show substantial uptake as outlined above and some areas show improvement/resolution of FDG uptake seen previously but there are signs of active bony metastatic disease currently. 3. Likely LEFT adrenal adenoma. 4. Severe  hepatic steatosis. 5. Aortic atherosclerosis.   Primary malignant neoplasm of breast with metastasis (HCC)  08/25/2019 Initial Diagnosis   Primary malignant neoplasm of  breast with metastasis (HCC)   10/11/2022 -  Chemotherapy   Patient is on Treatment Plan : BREAST METASTATIC Fam-Trastuzumab Deruxtecan-nxki  (Enhertu ) (5.4) q21d       CHIEF COMPLIANT: Follow-up to discuss her recent eye symptoms  HISTORY OF PRESENT ILLNESS: History of Present Illness Krystal [name] is a 58 year old female with a subretinal mass who presents with persistent eye floaters.  She experiences floaters in the upper aspect of her vision for about a month, resembling sunlight reflections on water. There is no associated pain or diplopia. A subretinal mass measuring 3.4 millimeters was previously identified.  She began treatment with Enhertu  in February of the previous year. Radiation was considered but deferred to assess the medication's efficacy. Subsequent scans showed resolution of a nodular plaque-like T2 signal enhancing posterior inferior left globe by May 2024. Recent MRIs, including one in June, show no mass or acute findings within the orbits.  She expresses concern about potential vision loss affecting her independence and work. She is scheduled for another MRI in September 2024 to monitor these findings.     ALLERGIES:  is allergic to dilaudid  [hydromorphone ] and codeine.  MEDICATIONS:  Current Outpatient Medications  Medication Sig Dispense Refill   ALPRAZolam  (XANAX ) 0.25 MG tablet Take 1 tablet (0.25 mg total) by mouth at bedtime as needed for anxiety. 30 tablet 3   aspirin  EC 81 MG tablet Take 1 tablet (81 mg total) by mouth daily. Swallow whole. 90 tablet 3   aspirin -acetaminophen -caffeine (EXCEDRIN MIGRAINE) 250-250-65 MG tablet Take 2 tablets by mouth every 6 (six) hours as needed for headache.     CALCIUM  PO Take 1,200 mg by mouth 2 (two) times daily.     dexamethasone  (DECADRON ) 4 MG tablet Take 1 tablets (4 mg) by mouth daily for 3 days after chemotherapy. Take with food. 30 tablet 1   dicyclomine  (BENTYL ) 20 MG tablet Take 1 tablet (20 mg total) by mouth  every 6 (six) hours as needed for up to 7 days for spasms. 28 tablet 0   esomeprazole (NEXIUM) 40 MG capsule Take 40 mg by mouth daily.     levofloxacin  (LEVAQUIN ) 750 MG tablet Take 1 tablet (750 mg total) by mouth daily. 7 tablet 0   lidocaine -prilocaine  (EMLA ) cream Apply to affected area once 30 g 3   loperamide (IMODIUM) 2 MG capsule Take 2 mg by mouth daily as needed for diarrhea or loose stools. Take 2 tablets at first diarrhea and then 1 tablet after additional loose stool. Max number in 24 hours is 8 tablets.     loratadine (CLARITIN) 10 MG tablet Take 10 mg by mouth daily.     meloxicam  (MOBIC ) 15 MG tablet TAKE 1 TABLET(15 MG) BY MOUTH DAILY AS NEEDED FOR PAIN 30 tablet 1   ondansetron  (ZOFRAN ) 8 MG tablet Take 1 tablet (8 mg total) by mouth every 8 (eight) hours as needed for nausea or vomiting. Start on the third day after chemotherapy. 30 tablet 1   prochlorperazine  (COMPAZINE ) 10 MG tablet Take 1 tablet (10 mg total) by mouth every 6 (six) hours as needed for nausea or vomiting. 30 tablet 1   rosuvastatin  (CRESTOR ) 10 MG tablet Take 1 tablet (10 mg total) by mouth daily. 90 tablet 3   traMADol  (ULTRAM ) 50 MG tablet Take 1 tablet (50 mg total) by mouth every  6 (six) hours as needed. 10 tablet 0   traZODone  (DESYREL ) 50 MG tablet TAKE 1 TABLET(50 MG) BY MOUTH AT BEDTIME 90 tablet 3   venlafaxine  XR (EFFEXOR -XR) 75 MG 24 hr capsule TAKE 1 CAPSULE(75 MG) BY MOUTH AT BEDTIME 90 capsule 3   No current facility-administered medications for this visit.    PHYSICAL EXAMINATION: ECOG PERFORMANCE STATUS: 1 - Symptomatic but completely ambulatory  Vitals:   02/06/24 1206  BP: 130/80  Pulse: 86  Resp: 18  Temp: 98.6 F (37 C)  SpO2: 98%   Filed Weights   02/06/24 1206  Weight: 230 lb 9.6 oz (104.6 kg)    Physical Exam    (exam performed in the presence of a chaperone)  LABORATORY DATA:  I have reviewed the data as listed    Latest Ref Rng & Units 01/25/2024    3:03 PM  12/27/2023    2:06 PM 11/29/2023   12:24 PM  CMP  Glucose 70 - 99 mg/dL 843  89  897   BUN 6 - 20 mg/dL 10  10  11    Creatinine 0.44 - 1.00 mg/dL 9.32  9.36  9.37   Sodium 135 - 145 mmol/L 141  142  141   Potassium 3.5 - 5.1 mmol/L 4.5  4.2  4.1   Chloride 98 - 111 mmol/L 104  105  106   CO2 22 - 32 mmol/L 32  30  29   Calcium  8.9 - 10.3 mg/dL 9.1  9.1  9.1   Total Protein 6.5 - 8.1 g/dL 7.0  6.9  6.8   Total Bilirubin 0.0 - 1.2 mg/dL 0.3  0.4  0.5   Alkaline Phos 38 - 126 U/L 75  71  69   AST 15 - 41 U/L 29  29  33   ALT 0 - 44 U/L 38  36  38     Lab Results  Component Value Date   WBC 4.0 01/25/2024   HGB 12.7 01/25/2024   HCT 38.1 01/25/2024   MCV 92.0 01/25/2024   PLT 216 01/25/2024   NEUTROABS 2.3 01/25/2024    ASSESSMENT & PLAN:  Cancer of central portion of left female breast (HCC) 08/22/2019: PET CT scan: Metastatic disease in the chest with multiple lung nodules all subcentimeter size and hypermetabolic lymph nodes (right paratracheal node 7 mm, subcarinal node 1 cm, right hilar node 6 mm), retroperitoneal and upper pelvic common iliac lymph nodes, bone metastases especially L5, L4, T6, right 10th rib, left proximal femur   Bone biopsy was not felt to be reliable in addition to the effect of radiation on the bone. Palliative radiation completed 09/03/2019 [facility]: BRCA1 mutation, TMB 5.36, MSI: Normal   Prior treatment: Ibrance  with letrozole  and Xgeva for bone metastases switched to abemaciclib  and Fulvestrant    Bone metastasis: Patient does not want to take bisphosphonates CT CAP 08/23/2022: New and enlarged pulmonary nodule consistent with progression.  Enlarged small low cervical and mediastinal nodes.  Multiple bone metastases minimally progressive.  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  Skull metastases, abnormal enhancement left globe  ---------------------------------------------------------------- Current treatment:: Enhertu  cycle 20 Enhertu  toxicities: Fatigue    CT CAP  10/11/2023: Continued decrease in the pulmonary metastases, no significant change in the liver lesions, unchanged bone metastases   Dr. Buckley recommended continued MRI surveillance   Return to clinic every 3-week for Enhertu  treatments. We are doing scans every 6 months.  Ocular issues: Patient developed floaters in the left eye about a month ago and ophthalmology evaluation revealed 3.6 cm subcu nodule mass.  This was originally detected on MRI in April 2024.  Subsequent MRIs have not commented on it.  Treatment plan: I will reach out to Dr. Patrcia for discussion regarding radiation to the eye. In the meantime we will switch her Enhertu  treatments back to every 3 weeks.  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 Not visible on MRI, likely below detection threshold. Previous similar mass resolved spontaneously. Enhertu  may not reach affected area. Differential includes systemic involvement. Concerns about vision loss and quality of life. Dissatisfaction with prior communication on radiation therapy risks and outcomes. - Contact radiologist to review MRI images for mass visibility. - Consult Dr. Rickford and Dr. Patrcia on treatment options, including radiation therapy. - Consider detailed eye MRI if specialists recommend. - Adjust Enhertu  to every three weeks. - Discuss natural supplements like soursop, avoid mushrooms. - Advise safe cannabis use.      No orders of the  defined types were placed in this encounter.  The patient has a good understanding of the overall plan. she agrees with it. she will call with any problems that may develop before the next visit here. Total time spent: 30 mins including face to face time and time spent for planning, charting and co-ordination of care   Naomi MARLA Chad, MD 02/06/24

## 2024-02-06 NOTE — Assessment & Plan Note (Signed)
 08/22/2019: PET CT scan: Metastatic disease in the chest with multiple lung nodules all subcentimeter size and hypermetabolic lymph nodes (right paratracheal node 7 mm, subcarinal node 1 cm, right hilar node 6 mm), retroperitoneal and upper pelvic common iliac lymph nodes, bone metastases especially L5, L4, T6, right 10th rib, left proximal femur   Bone biopsy was not felt to be reliable in addition to the effect of radiation on the bone. Palliative radiation completed 09/03/2019 [facility]: BRCA1 mutation, TMB 5.36, MSI: Normal   Prior treatment: Ibrance  with letrozole  and Xgeva for bone metastases switched to abemaciclib  and Fulvestrant    Bone metastasis: Patient does not want to take bisphosphonates CT CAP 08/23/2022: New and enlarged pulmonary nodule consistent with progression.  Enlarged small low cervical and mediastinal nodes.  Multiple bone metastases minimally progressive.  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  Skull metastases, abnormal enhancement left globe  ---------------------------------------------------------------- Current treatment:: Enhertu  cycle 20 Enhertu  toxicities: Fatigue    CT CAP 10/11/2023: Continued decrease in the pulmonary metastases, no significant change in the liver lesions, unchanged bone metastases   Dr. Buckley recommended continued MRI surveillance   Return to clinic every 3-week for Enhertu  treatments. We are doing scans every 6 months.  Ocular issues: Patient developed floaters in the left eye about a month ago and ophthalmology evaluation revealed 3.6 cm subcu nodule mass.  This was originally  detected on MRI in April 2024.  Subsequent MRIs have not commented on it.  Treatment plan: I will reach out to Dr. Patrcia for discussion regarding radiation to the eye. In the meantime we will switch her Enhertu  treatments back to every 3 weeks.

## 2024-02-07 ENCOUNTER — Telehealth: Payer: Self-pay | Admitting: Radiation Therapy

## 2024-02-07 NOTE — Telephone Encounter (Signed)
 Pt called back to confirm she will be here for the consult with Dr. Patrcia on Monday 7/7.   Devere Perch R.T(R)(T)

## 2024-02-07 NOTE — Telephone Encounter (Signed)
 Left a detailed message for patient about consult with Dr. Patrcia 7/7. My contact information was included with a request to call back if she has questions or a conflict.   Devere Perch R.T(R)(T) Radiation Special Procedures Lead

## 2024-02-11 ENCOUNTER — Encounter: Payer: Self-pay | Admitting: Radiation Oncology

## 2024-02-11 ENCOUNTER — Ambulatory Visit
Admission: RE | Admit: 2024-02-11 | Discharge: 2024-02-11 | Disposition: A | Discharge status: Home / Self Care | Source: Ambulatory Visit | Attending: Radiation Oncology | Admitting: Radiation Oncology

## 2024-02-11 ENCOUNTER — Ambulatory Visit
Admission: RE | Admit: 2024-02-11 | Discharge: 2024-02-11 | Disposition: A | Discharge status: Home / Self Care | Source: Ambulatory Visit | Attending: Radiation Oncology

## 2024-02-11 VITALS — BP 145/85 | HR 93 | Temp 96.2°F | Resp 18 | Ht 64.0 in | Wt 236.5 lb

## 2024-02-11 DIAGNOSIS — Z171 Estrogen receptor negative status [ER-]: Secondary | ICD-10-CM | POA: Insufficient documentation

## 2024-02-11 DIAGNOSIS — C7949 Secondary malignant neoplasm of other parts of nervous system: Secondary | ICD-10-CM | POA: Insufficient documentation

## 2024-02-11 DIAGNOSIS — C6932 Malignant neoplasm of left choroid: Secondary | ICD-10-CM

## 2024-02-11 DIAGNOSIS — Z7952 Long term (current) use of systemic steroids: Secondary | ICD-10-CM | POA: Diagnosis not present

## 2024-02-11 DIAGNOSIS — Z800 Family history of malignant neoplasm of digestive organs: Secondary | ICD-10-CM | POA: Diagnosis not present

## 2024-02-11 DIAGNOSIS — Z7982 Long term (current) use of aspirin: Secondary | ICD-10-CM | POA: Insufficient documentation

## 2024-02-11 DIAGNOSIS — Z1501 Genetic susceptibility to malignant neoplasm of breast: Secondary | ICD-10-CM | POA: Diagnosis not present

## 2024-02-11 DIAGNOSIS — Z87891 Personal history of nicotine dependence: Secondary | ICD-10-CM | POA: Diagnosis not present

## 2024-02-11 DIAGNOSIS — Z79899 Other long term (current) drug therapy: Secondary | ICD-10-CM | POA: Insufficient documentation

## 2024-02-11 DIAGNOSIS — E119 Type 2 diabetes mellitus without complications: Secondary | ICD-10-CM | POA: Insufficient documentation

## 2024-02-11 DIAGNOSIS — K219 Gastro-esophageal reflux disease without esophagitis: Secondary | ICD-10-CM | POA: Insufficient documentation

## 2024-02-11 DIAGNOSIS — C50112 Malignant neoplasm of central portion of left female breast: Secondary | ICD-10-CM | POA: Diagnosis not present

## 2024-02-11 DIAGNOSIS — C7931 Secondary malignant neoplasm of brain: Secondary | ICD-10-CM | POA: Insufficient documentation

## 2024-02-11 DIAGNOSIS — Z803 Family history of malignant neoplasm of breast: Secondary | ICD-10-CM | POA: Insufficient documentation

## 2024-02-11 NOTE — Progress Notes (Signed)
 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    ICD-10-CM   1. Cancer of choroid of left eye [facility])  C69.32       HISTORY OF PRESENT ILLNESS: Krystal [name] is a 58 y.o. female seen at the request of Dr. Odean. She is well known to our clinic, with her last follow up being with Dr. Izell in 04/2023. In summary, she was initially diagnosed with malignant melanoma of her left upper back and stage IA left breast cancer in 02/2014. 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 She underwent excision of the melanoma and bilateral mastectomies on 04/16/14. She received a year of tamoxifen  before undergoing TAS with BSO in 08/2014 and did not restart antiestrogen therapy following surgery. She unfortunately developed local recurrence to the left chest wall in 09/2015 and was treated with radiation therapy under Dr. Shannon in 11/2015, antiestrogen therapy, and chemotherapy with herceptin  under Dr. Gudena. She completed herceptin  treatment in 02/2017. She later developed metastatic disease in 07/2019 and again received radiation therapy under Dr. Shannon in 08/2019, directed at two sites of osseous disease. She was also started on Ibrance  in 08/2019. She was found to have disease progression on the Ibrance  and was ultimately switched to Verzenio  and Faslodex  in 11/2021. Despite the change in treatment, she continued to develop disease progression, including disease to the bones, lymph nodes, liver, and lungs. 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    She was switched to Enhertu  in 10/2022 and referred to Dr. Izell in 11/2022 to discuss potential radiation therapy. Given stability after changing treatment, she opted to forgo radiation therapy at that time. Of note, her most recent staging CT C/A/P from 10/05/23 showed continued decrease in size of pulmonary metastases and unchanged liver lesions and sclerotic osseous metastases.  Most recently, she developed persistent eye floaters in 12/2023. She underwent surveillance brain MRI on 01/17/24 showing: slight increase in size of a 3 mm hyperintense lesion within right cerebellar hemisphere; a previously demonstrated focus at right frontal operculum not appreciated; unchanged lesion within left periatrial white matter, focus at anterior left temporal lobe, leptomeningeal metastatic disease along right central sulcus, pachymeningeal metastatic disease overlying high right frontal lobe, and calvarial metastases.    She was referred to see Dr. Selinda Slocumb of Ophthalmology, and he noted a subretinal mass 3.4 mm, not visible on MRI, likely below detection threshold. Previous similar mass resolved spontaneously. There is concern that Enhertu  may not reach affected area. Based on patient concerns about vision loss and quality of life, she has been referred today to discuss ocular radiotherapy.   PREVIOUS RADIATION THERAPY: Yes  08/21/2019 through 09/03/2019 Site Technique Total Dose (Gy) Dose per Fx (Gy) Completed Fx Beam Energies  Femur Left: Ext_Lt 3D 30/30 3 10/10 10X, 15X  Lumbar Spine: Spine 3D 30/30 3 10/10 10X, 15X   11/25/15 -  01/14/16:  1) Left chest wall treated to 45 Gy in 25 fractions  2) Scar boost treated to 18 Gy in 9 fractions    PAST MEDICAL HISTORY:  Past Medical History:  Diagnosis Date   Arthritis     shoulders   BRCA1 positive    BRCA1 [id number]   Cancer (HCC)    Chest wall mass 09/2015   Complication of anesthesia    respiratory depression/arrest with Dilaudid    Cough with sputum 09/10/2015   clear sputum, per pt.   Dental crowns present    Diabetes mellitus without complication (HCC)    type 2   Family history of Lynch syndrome    Family history of pancreatic cancer    GERD (gastroesophageal reflux disease)    takes OTC meds   Headache    History of breast cancer 2015   Stuffy and runny nose 09/10/2015   green drainage from nose, per pt.      PAST SURGICAL HISTORY: Past Surgical History:  Procedure Laterality Date   ABDOMINAL HYSTERECTOMY     BREAST LUMPECTOMY Left 09/16/2015   Procedure: LEFT BREAST MASS EXCISION;  Surgeon: Donnice Bury, MD;  Location: [facility];  Service: General;  Laterality: Left;   BREAST RECONSTRUCTION WITH PLACEMENT OF TISSUE EXPANDER AND FLEX HD (ACELLULAR HYDRATED DERMIS) Bilateral 04/16/2014   Procedure: BILATERAL BREAST RECONSTRUCTION WITH PLACEMENT OF TISSUE EXPANDER AND FLEX HD (ACELLULAR HYDRATED DERMIS);  Surgeon: Earlis Ranks, MD;  Location: [facility];  Service: Plastics;  Laterality: Bilateral;   CESAREAN SECTION  1999; 2001; 2005   CHOLECYSTECTOMY  1995   COLONOSCOPY     DEBRIDEMENT AND CLOSURE WOUND Bilateral 05/15/2014   Procedure: DEBRIDEMENT AND CLOSURE OF BILATERAL MASECTOMY INCISIONS WITH BREAST EXPANSION;  Surgeon: Earlis Ranks, MD;  Location: [facility];  Service: Plastics;  Laterality: Bilateral;   LAPAROSCOPIC ASSISTED VAGINAL HYSTERECTOMY N/A 08/13/2014   Procedure: OPEN LAPAROSCOPY, EXPLORATORY LAPAROTOMY, TOTAL ABDOMINAL HYSTERECTOMY WITH BILATERAL SALPINGECTOMY AND BILATERAL OOPHORECTOMY;  Surgeon: Norleen GORMAN Skill, MD;  Location: [facility];  Service: Gynecology;  Laterality: N/A;   LIPOSUCTION WITH LIPOFILLING Bilateral 11/13/2014   Procedure:  LIPOFILLING TO BILATERAL CHEST ;  Surgeon: Earlis Ranks, MD;  Location: [facility];  Service: Plastics;  Laterality: Bilateral;   MELANOMA EXCISION N/A 04/16/2014   Procedure: WIDE LOCAL EXCISION OF BACK MELANOMA;  Surgeon: Donnice Bury, MD;  Location: [facility];  Service: General;  Laterality: N/A;   PORT-A-CATH REMOVAL Right 06/14/2017   Procedure: REMOVAL PORT-A-CATH;  Surgeon: Bury Donnice, MD;  Location: [facility];  Service: General;  Laterality: Right;   PORTACATH PLACEMENT Right 10/21/2015   Procedure: INSERTION PORT-A-CATH WITH ULTRASOUND ;  Surgeon: Donnice Bury, MD;  Location: [facility];  Service: General;  Laterality: Right;   PORTACATH PLACEMENT N/A 10/10/2022   Procedure: INSERTION PORT-A-CATH;  Surgeon: Bury Donnice, MD;  Location: [facility];  Service: General;  Laterality: N/A;   REMOVAL OF BILATERAL TISSUE EXPANDERS WITH PLACEMENT OF BILATERAL BREAST IMPLANTS Bilateral 11/13/2014   Procedure: REMOVAL OF BILATERAL TISSUE EXPANDERS WITH PLACEMENT OF BILATERAL SILICONE IMPLANTS ;  Surgeon: Earlis Ranks, MD;  Location: [facility];  Service: Plastics;  Laterality: Bilateral;   SIMPLE MASTECTOMY WITH AXILLARY SENTINEL NODE BIOPSY Bilateral 04/16/2014   Procedure: BILATERAL SKIN SPARING  MASTECTOMIES WITH LEFT AXILLARY SENTINEL NODE BIOPSY;  Surgeon: Donnice Bury, MD;  Location:  SURGERY CENTER;  Service: General;  Laterality: Bilateral;  FAMILY HISTORY:  Family History  Problem Relation Age of Onset   Other Father        COVID complications   Breast cancer Paternal Aunt        dx > 50   Pancreatic cancer Paternal Uncle 33       smoker; deceased   Heart disease Maternal Grandmother    Heart attack Maternal Grandfather    Breast cancer Paternal Grandmother        dx 74s; ? if had 2nd BC in 71s; deceased 27s   Other Cousin        Lynch syndrome due to PMS2   BRCA 1/2  Cousin     SOCIAL HISTORY:  Social History   Socioeconomic History   Marital status: Widowed    Spouse name: Not on file   Number of children: 3   Years of education: Not on file   Highest education level: Not on file  Occupational History   Occupation: Geophysical data processor: [employer]  Tobacco Use   Smoking status: Former    Current packs/day: 0.00    Types: Cigarettes    Quit date: 04/17/2014    Years since quitting: 9.8   Smokeless tobacco: Never   Tobacco comments:    smokes 1-2 cigs every week  Vaping Use   Vaping status: Never Used  Substance and Sexual Activity   Alcohol use: Yes    Comment: occasionally   Drug use: No   Sexual activity: Not Currently    Birth control/protection: Surgical  Other Topics Concern   Not on file  Social History Narrative   Not on file   Social Drivers of Health   Financial Resource Strain: Low Risk  (11/07/2023)   Received from [facility]   Overall Financial Resource Strain (CARDIA)    Difficulty of Paying Living Expenses: Not hard at all  Food Insecurity: No Food Insecurity (11/07/2023)   Received from [facility]   Hunger Vital Sign    Within the past 12 months, you worried that your food would run out before you got the money to buy more.: Never true    Within the past 12 months, the food you bought just didn't last and you didn't have money to get more.: Never true  Transportation Needs: No Transportation Needs (11/07/2023)   Received from [facility] - Transportation    Lack of Transportation (Medical): No    Lack of Transportation (Non-Medical): No  Physical Activity: Unknown (07/28/2021)   Received from [facility]   Exercise Vital Sign    On average, how many days per week do you engage in moderate to strenuous exercise (like a brisk walk)?: 0 days    Minutes of Exercise per Session: Not on file  Stress: No Stress Concern Present (06/16/2023)   Received from [facility] - Occupational Stress Questionnaire    Feeling of Stress : Not at all  Social Connections: Unknown (12/18/2021)   Received from [facility]   Social Network    Social Network: Not on file  Intimate Partner Violence: Not At Risk (06/16/2023)   Received from [facility]   HITS    Over the last 12 months how often did your partner physically hurt you?: Never    Over the last 12 months how often did your partner insult you or talk down to you?: Never    Over the last 12  months how often did your partner threaten you with physical harm?: Never    Over the last 12 months how often did your partner scream or curse at you?: Never    ALLERGIES: Dilaudid  [hydromorphone ] and Codeine  MEDICATIONS:  Current Outpatient Medications  Medication Sig Dispense Refill   ALPRAZolam  (XANAX ) 0.25 MG tablet Take 1 tablet (0.25 mg total) by mouth at bedtime as needed for anxiety. 30 tablet 3   aspirin  EC 81 MG tablet Take 1 tablet (81 mg total) by mouth daily. Swallow whole. 90 tablet 3   aspirin -acetaminophen -caffeine (EXCEDRIN MIGRAINE) 250-250-65 MG tablet Take 2 tablets by mouth every 6 (six) hours as needed for headache.     CALCIUM  PO Take 1,200 mg by mouth 2 (two) times daily.     dexamethasone  (DECADRON ) 4 MG tablet Take 1 tablets (4 mg) by mouth daily for 3 days after chemotherapy. Take with food. 30 tablet 1   dicyclomine  (BENTYL ) 20 MG tablet Take 1 tablet (20 mg total) by mouth every 6 (six) hours as needed for up to 7 days for spasms. 28 tablet 0   esomeprazole (NEXIUM) 40 MG capsule Take 40 mg by mouth daily.     levofloxacin  (LEVAQUIN ) 750 MG tablet Take 1 tablet (750 mg total) by mouth daily. 7 tablet 0   lidocaine -prilocaine  (EMLA ) cream Apply to affected area once 30 g 3   loperamide (IMODIUM) 2 MG capsule Take 2 mg by mouth daily as needed for diarrhea or loose stools. Take 2 tablets at first diarrhea and then 1 tablet after additional loose  stool. Max number in 24 hours is 8 tablets.     loratadine (CLARITIN) 10 MG tablet Take 10 mg by mouth daily.     meloxicam  (MOBIC ) 15 MG tablet TAKE 1 TABLET(15 MG) BY MOUTH DAILY AS NEEDED FOR PAIN 30 tablet 1   ondansetron  (ZOFRAN ) 8 MG tablet Take 1 tablet (8 mg total) by mouth every 8 (eight) hours as needed for nausea or vomiting. Start on the third day after chemotherapy. 30 tablet 1   prochlorperazine  (COMPAZINE ) 10 MG tablet Take 1 tablet (10 mg total) by mouth every 6 (six) hours as needed for nausea or vomiting. 30 tablet 1   rosuvastatin  (CRESTOR ) 10 MG tablet Take 1 tablet (10 mg total) by mouth daily. 90 tablet 3   traMADol  (ULTRAM ) 50 MG tablet Take 1 tablet (50 mg total) by mouth every 6 (six) hours as needed. 10 tablet 0   traZODone  (DESYREL ) 50 MG tablet TAKE 1 TABLET(50 MG) BY MOUTH AT BEDTIME 90 tablet 3   venlafaxine  XR (EFFEXOR -XR) 75 MG 24 hr capsule TAKE 1 CAPSULE(75 MG) BY MOUTH AT BEDTIME 90 capsule 3   No current facility-administered medications for this encounter.    REVIEW OF SYSTEMS:  On review of systems, the patient reports that she is doing well overall. She denies any chest pain, shortness of breath, cough, fevers, chills, night sweats, unintended weight changes. She denies any bowel or bladder disturbances, and denies abdominal pain, or vomiting. She denies any new musculoskeletal or joint aches or pains. She reports headaches, nausea, visual deficits/changes, and some confusion/memory deficits. A complete review of systems is obtained and is otherwise negative.    PHYSICAL EXAM:  Wt Readings from Last 3 Encounters:  02/11/24 236 lb 8 oz (107.3 kg)  02/06/24 230 lb 9.6 oz (104.6 kg)  01/25/24 228 lb 7 oz (103.6 kg)   Temp Readings from Last 3 Encounters:  02/11/24 (!) 96.2  F (35.7 C) (Temporal)  02/06/24 98.6 F (37 C) (Temporal)  01/25/24 98 F (36.7 C) (Oral)   BP Readings from Last 3 Encounters:  02/11/24 (!) 145/85  02/06/24 130/80  01/25/24  122/77   Pulse Readings from Last 3 Encounters:  02/11/24 93  02/06/24 86  01/25/24 82    /10  In general this is a well appearing woman in no acute distress. She's alert and oriented x4 and appropriate throughout the examination. Cardiopulmonary assessment is negative for acute distress and she exhibits normal effort.     KPS = 90  100 - Normal; no complaints; no evidence of disease. 90   - Able to carry on normal activity; minor signs or symptoms of disease. 80   - Normal activity with effort; some signs or symptoms of disease. 51   - Cares for self; unable to carry on normal activity or to do active work. 60   - Requires occasional assistance, but is able to care for most of his personal needs. 50   - Requires considerable assistance and frequent medical care. 40   - Disabled; requires special care and assistance. 30   - Severely disabled; hospital admission is indicated although death not imminent. 20   - Very sick; hospital admission necessary; active supportive treatment necessary. 10   - Moribund; fatal processes progressing rapidly. 0     - Dead  Karnofsky DA, Abelmann WH, Craver LS and Burchenal [facility] [phone]) The use of the nitrogen mustards in the palliative treatment of carcinoma: with particular reference to bronchogenic carcinoma Cancer 1 634-56  LABORATORY DATA:  Lab Results  Component Value Date   WBC 4.0 01/25/2024   HGB 12.7 01/25/2024   HCT 38.1 01/25/2024   MCV 92.0 01/25/2024   PLT 216 01/25/2024   Lab Results  Component Value Date   NA 141 01/25/2024   K 4.5 01/25/2024   CL 104 01/25/2024   CO2 32 01/25/2024   Lab Results  Component Value Date   ALT 38 01/25/2024   AST 29 01/25/2024   ALKPHOS 75 01/25/2024   BILITOT 0.3 01/25/2024     RADIOGRAPHY: MR BRAIN W WO CONTRAST Result Date: 01/18/2024 CLINICAL DATA:  Provided history: Breast cancer metastasized to brain, unspecified laterality. Brain/CNS neoplasm, assess treatment response. EXAM: MRI HEAD  WITHOUT AND WITH CONTRAST TECHNIQUE: Multiplanar, multiecho pulse sequences of the brain and surrounding structures were obtained without and with intravenous contrast. CONTRAST:  7 mL open view COMPARISON:  Prior brain MRI examinations 10/16/2023 and earlier. FINDINGS: Brain: Cerebral volume is normal. Unchanged leptomeningeal enhancement along the right central sulcus (series 13, image 129). Unchanged pachymeningeal enhancement overlying the high right frontal lobe (for instance, as seen on series 15, image 30). Unchanged punctate focus of enhancement at the anterior left temporal lobe, indeterminate for a metastasis versus vascular enhancement (series 13, image 61). A punctate focus of enhancement previously demonstrated at the right frontal operculum is not appreciated on today's examination. Unchanged punctate precontrast T1 hyperintense lesion within the left periatrial white matter (series 11, image 72). Precontrast T1 hyperintensity limits evaluation for enhancement at this site. In retrospect, this has been present dating back to the brain MRI of 04/13/2023. Slight interval increase in size of a 3 mm precontrast T1 hyperintense lesion in the right cerebellar hemisphere (series 11, image 23). Precontrast T1 hyperintensity limits evaluation for enhancement at this site. Multifocal T2 FLAIR hyperintense signal abnormality within the cerebral white matter, nonspecific but compatible with mild chronic small  vessel ischemic disease. Numerous tiny foci of chronic hemosiderin deposition again demonstrated within the supratentorial brain. Partially empty sella turcica. There is no acute infarct. No extra-axial fluid collection. No midline shift. Vascular: Maintained flow voids within the proximal large arterial vessels. Skull and upper cervical spine: Unchanged sclerotic calvarial metastases. Unchanged 6 mm T2 hyperintense and enhancing lesion within the left parietal calvarium, indeterminate but potentially  reflecting an additional metastasis. Sinuses/Orbits: No mass or acute finding within the imaged orbits. No significant paranasal sinus disease. Other: Persistent left mastoid effusion. Impression #1 will be called to the ordering clinician or representative by the Radiologist Assistant, and communication documented in the PACS or [employer]. IMPRESSION: 1. Slight interval increase in size of a 3 mm precontrast T1 hyperintense lesion within the right cerebellar hemisphere. Precontrast T1 hyperintense signal limits evaluation for enhancement at this site. Short-interval MRI follow-up is recommended. 2. A previously demonstrated punctate enhancing focus at the right frontal operculum is not appreciated on today's exam. 3. Unchanged punctate a precontrast T1 hyperintense lesion within the left periatrial white matter. Precontrast T1 hyperintensity limits evaluation for enhancement at this site. 4. Unchanged punctate focus of enhancement at the anterior left temporal lobe, indeterminate for a metastasis versus vascular enhancement. 5. 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 6. Unchanged pachymeningeal metastatic disease overlying the high right frontal lobe. 7. Unchanged calvarial metastases. Electronically Signed   By: Rockey Childs D.O.   On: 01/18/2024 08:43      IMPRESSION/PLAN: 1. 58 y.o. 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  She may benefit from localized radiotherapy to the left posterior globe.  Choroidal metastasis is the most common intraocular malignancy in adults, though still uncommon overall. Breast cancer accounts for approximately 40-50% of cases, with reported incidence in metastatic breast cancer patients ranging from 2-9%. EBRT using a conformal 30 Gy in 10 fractions technique has been shown to palliate symptoms and  stabilize vision in 85-90% of cases with low risk of serious late toxicity. At this dose level, the most likely long-term effect is cataract formation, which is treatable.  Today, we talked to the patient about the findings and workup thus far. We discussed the natural history of choroidal metastases from breast carcinoma and general treatment, highlighting the role of radiotherapy in the management. We discussed the available radiation techniques, and focused on the details and logistics of delivery. We reviewed the anticipated acute and late sequelae associated with radiation in this setting.  With a prescription level of 30 Gy in 10 fractions using conformal techniques, she understands there may be some temorary acute effects including localized hair loss, skin irritation and dry eye amenable to artificial tears.  In the long term, we would not typically anticipate chronic late effects at this low dose level.  However, even minuscule radiation doses can increase the risk of future cataracts, which would be amenable to cataract surgery.  The patient was encouraged to ask questions that were answered to her satisfaction.  She would like to proceed with palliative radiotherapy to the left posterior globe for palliation of visual symptoms and preservation of vision.  We will plan to proceed with simulation on 02/19/24.  I personally spent 60 minutes in this encounter including chart review, reviewing radiological studies, meeting face-to-face with the patient, entering orders and completing documentation.   ------------------------------------------------   Donnice  Patrcia, MD [facility]  Radiation Oncology Direct Dial: [phone]  Fax: [phone] [email or web address]  Skype  [email or web address]   This document serves as a record of services personally performed by Donnice Patrcia, MD. It was created on his behalf by Izetta Neither, a trained medical scribe. The creation of this record is based on the  scribe's personal observations and the provider's statements to them. This document has been checked and approved by the attending provider.    Delene DELBRA Delene MILUS. Management of choroidal metastases: the role of radiation therapy. Int Ophthalmol Clin. 1997;37(4):109-120. Wiegel T, et al. Radiotherapy of choroidal metastases--final results of a prospective study of the [place] [employer]. Strahlenther Onkol. 2001;177(8):382-386. Lillette NUTLEY, et al. Choroidal metastasis in disseminated breast cancer: frequency and risks. Am J Ophthalmol. 2003;135(2):230-235.

## 2024-02-11 NOTE — Progress Notes (Signed)
 Location/Histology of Brain Tumor: Right Cerebellar hemisphere/Subretinal mass   Primary Dx:  Malignant neoplasm of central portion of left breast in female, estrogen receptor positive   Krystal [name] presented as referral from Dr. Vinay Gudena for Subretinal mass with persistent eye floaters.   01/17/2024 Dr. Arthea Manns MR Brain with/without Contrast CLINICAL DATA:  Provided history: Breast cancer metastasized to brain, unspecified laterality. Brain/CNS neoplasm, assess treatment response.  IMPRESSION: 1. Slight interval increase in size of a 3 mm precontrast T1 hyperintense lesion within the right cerebellar hemisphere. Precontrast T1 hyperintense signal limits evaluation for enhancement at this site. Short-interval MRI follow-up is recommended. 2. A previously demonstrated punctate enhancing focus at the right frontal operculum is not appreciated on today's exam. 3. Unchanged punctate a precontrast T1 hyperintense lesion within the left periatrial white matter. Precontrast T1 hyperintensity limits evaluation for enhancement at this site. 4. Unchanged punctate focus of enhancement at the anterior left temporal lobe, indeterminate for a metastasis versus vascular enhancement. 5. 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 6. Unchanged pachymeningeal metastatic disease overlying the high right frontal lobe. 7. Unchanged calvarial metastases.   10/16/2023 Dr. Lauraine Golden MR Brain with/without Contrast CLINICAL DATA:  Provided history: Primary malignant neoplasm of breast with metastasis. Metastatic disease evaluation.   IMPRESSION: 1. Punctate focus of enhancement within/along the right frontal operculum. This was not definitively present on prior exams and is indeterminate for a metastasis versus vascular enhancement. 2. Unchanged enhancing leptomeningeal metastatic disease within the right central sulcus. 3. Unchanged enhancing  pachymeningeal metastatic disease overlying the high right frontal lobe. 4. Punctate focus of enhancement within/along the anterior left temporal lobe. This is unchanged from the prior brain MRI and indeterminate for a metastasis versus vascular enhancement. 5. Unchanged sclerotic calvarial metastases. 6. Unchanged 6 mm enhancing focus within the left parietal calvarium. Although nonspecific, this could potentially reflect an additional osseous metastasis. 7. Left mastoid effusion.   Past/Anticipated interventions by medical oncology, if any:  Dr. Mackey Chad   Recent neurologic symptoms, if any:  Seizures: No Headaches: Yes, takes OTC pain medication. Nausea:  Yes, has medication  Dizziness/ataxia: No Difficulty with hand coordination: No Focal numbness/weakness: No Visual deficits/changes: Yes Confusion/Memory deficits: Sometimes.  Painful bone metastases at present, if any: No Dose of Decadron , if applicable: No  SAFETY ISSUES: Prior radiation?  Yes, 11/25/15 - 01/14/16: 1) Left chest wall treated to 45 Gy in 25 fractions and 2) Scar boost treated to 18 Gy in 9 fractions  Pacemaker/ICD? No Possible current pregnancy? Hysterectomy (07/06/2014). Is the patient on methotrexate? No  Current Complaints / other details:

## 2024-02-12 ENCOUNTER — Telehealth: Payer: Self-pay | Admitting: Internal Medicine

## 2024-02-12 NOTE — Telephone Encounter (Signed)
 Called to schedule patient appointment to due per  staff Message . I talked to patient and they are aware of the upcoming appointment

## 2024-02-13 ENCOUNTER — Other Ambulatory Visit: Payer: Self-pay

## 2024-02-19 ENCOUNTER — Ambulatory Visit
Admission: RE | Admit: 2024-02-19 | Discharge: 2024-02-19 | Disposition: A | Discharge status: Home / Self Care | Source: Ambulatory Visit | Attending: Radiation Oncology | Admitting: Radiation Oncology

## 2024-02-19 DIAGNOSIS — C7949 Secondary malignant neoplasm of other parts of nervous system: Secondary | ICD-10-CM | POA: Diagnosis present

## 2024-02-19 DIAGNOSIS — C50112 Malignant neoplasm of central portion of left female breast: Secondary | ICD-10-CM | POA: Diagnosis not present

## 2024-02-19 DIAGNOSIS — Z510 Encounter for antineoplastic radiation therapy: Secondary | ICD-10-CM | POA: Diagnosis not present

## 2024-02-19 DIAGNOSIS — C6932 Malignant neoplasm of left choroid: Secondary | ICD-10-CM | POA: Insufficient documentation

## 2024-02-19 NOTE — Progress Notes (Signed)
  Radiation Oncology         [phone] ________________________________  Name: Krystal [name] MRN: [id number]  Date: 02/19/2024  DOB: 12/23/1965  SIMULATION AND TREATMENT PLANNING NOTE    ICD-10-CM   1. 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  NARRATIVE:  The patient was brought to the CT Simulation planning suite.  Identity was confirmed.  All relevant records and images related to the planned course of therapy were reviewed.  The patient freely provided informed written consent to proceed with treatment after reviewing the details related to the planned course of therapy. The consent form was witnessed and verified by the simulation staff.  Then, the patient was set-up in a stable reproducible supine position for radiation therapy.  She was asked to keep her eyes closed with a neutral gaze downward.  CT images were obtained.  Surface markings were placed.  The CT images were loaded into the planning software.  Then the target and avoidance structures were contoured.  Treatment planning then occurred.  The radiation prescription was entered and confirmed.  Then, I designed and supervised the construction of a total of 5 medically necessary complex treatment devices including accuform neck cushion, aquaplast mask and 3 MLC apertures to treat the retina while shielding the critical brain and contralateral eye.  I have requested : 3D Simulation  I have requested a DVH of the following structures: lenses, brain, brainstem, the target and more  PLAN:  The patient will receive 30 Gy in 10 fractions.  ________________________________  Donnice FELIX Patrcia, M.D.

## 2024-02-20 MED FILL — Fosaprepitant Dimeglumine For IV Infusion 150 MG (Base Eq): INTRAVENOUS | Qty: 5 | Status: AC

## 2024-02-21 ENCOUNTER — Ambulatory Visit

## 2024-02-21 ENCOUNTER — Other Ambulatory Visit

## 2024-02-21 ENCOUNTER — Inpatient Hospital Stay

## 2024-02-21 ENCOUNTER — Inpatient Hospital Stay: Admitting: Hematology and Oncology

## 2024-02-21 ENCOUNTER — Ambulatory Visit: Admitting: Hematology and Oncology

## 2024-02-21 VITALS — BP 145/78 | HR 83 | Temp 98.4°F | Resp 18 | Wt 237.2 lb

## 2024-02-21 DIAGNOSIS — C50919 Malignant neoplasm of unspecified site of unspecified female breast: Secondary | ICD-10-CM

## 2024-02-21 DIAGNOSIS — C50112 Malignant neoplasm of central portion of left female breast: Secondary | ICD-10-CM

## 2024-02-21 DIAGNOSIS — Z170 Estrogen receptor positive status [ER+]: Secondary | ICD-10-CM | POA: Diagnosis not present

## 2024-02-21 DIAGNOSIS — C7949 Secondary malignant neoplasm of other parts of nervous system: Secondary | ICD-10-CM | POA: Diagnosis not present

## 2024-02-21 DIAGNOSIS — C7931 Secondary malignant neoplasm of brain: Secondary | ICD-10-CM | POA: Diagnosis not present

## 2024-02-21 LAB — CBC WITH DIFFERENTIAL (CANCER CENTER ONLY)
Abs Immature Granulocytes: 0.01 K/uL (ref 0.00–0.07)
Basophils Absolute: 0 K/uL (ref 0.0–0.1)
Basophils Relative: 1 %
Eosinophils Absolute: 0.3 K/uL (ref 0.0–0.5)
Eosinophils Relative: 7 %
HCT: 34.7 % — ABNORMAL LOW (ref 36.0–46.0)
Hemoglobin: 11.4 g/dL — ABNORMAL LOW (ref 12.0–15.0)
Immature Granulocytes: 0 %
Lymphocytes Relative: 30 %
Lymphs Abs: 1.2 K/uL (ref 0.7–4.0)
MCH: 30.4 pg (ref 26.0–34.0)
MCHC: 32.9 g/dL (ref 30.0–36.0)
MCV: 92.5 fL (ref 80.0–100.0)
Monocytes Absolute: 0.5 K/uL (ref 0.1–1.0)
Monocytes Relative: 12 %
Neutro Abs: 2 K/uL (ref 1.7–7.7)
Neutrophils Relative %: 50 %
Platelet Count: 196 K/uL (ref 150–400)
RBC: 3.75 MIL/uL — ABNORMAL LOW (ref 3.87–5.11)
RDW: 13.7 % (ref 11.5–15.5)
WBC Count: 3.9 K/uL — ABNORMAL LOW (ref 4.0–10.5)
nRBC: 0 % (ref 0.0–0.2)

## 2024-02-21 LAB — CMP (CANCER CENTER ONLY)
ALT: 28 U/L (ref 0–44)
AST: 22 U/L (ref 15–41)
Albumin: 3.6 g/dL (ref 3.5–5.0)
Alkaline Phosphatase: 64 U/L (ref 38–126)
Anion gap: 4 — ABNORMAL LOW (ref 5–15)
BUN: 9 mg/dL (ref 6–20)
CO2: 27 mmol/L (ref 22–32)
Calcium: 7.9 mg/dL — ABNORMAL LOW (ref 8.9–10.3)
Chloride: 110 mmol/L (ref 98–111)
Creatinine: 0.52 mg/dL (ref 0.44–1.00)
GFR, Estimated: >60 mL/min (ref >60)
Glucose, Bld: 98 mg/dL (ref 70–99)
Potassium: 4 mmol/L (ref 3.5–5.1)
Sodium: 141 mmol/L (ref 135–145)
Total Bilirubin: 0.2 mg/dL (ref 0.0–1.2)
Total Protein: 6.1 g/dL — ABNORMAL LOW (ref 6.5–8.1)

## 2024-02-21 MED ORDER — ACETAMINOPHEN 325 MG PO TABS
650.0000 mg | ORAL_TABLET | Freq: Once | ORAL | Status: AC
  Administered 2024-02-21: 650 mg via ORAL
  Filled 2024-02-21: qty 2

## 2024-02-21 MED ORDER — SODIUM CHLORIDE 0.9% FLUSH
10.0000 mL | INTRAVENOUS | Status: DC | PRN
  Administered 2024-02-21: 10 mL

## 2024-02-21 MED ORDER — DEXAMETHASONE SODIUM PHOSPHATE 10 MG/ML IJ SOLN
10.0000 mg | Freq: Once | INTRAMUSCULAR | Status: AC
  Administered 2024-02-21: 10 mg via INTRAVENOUS
  Filled 2024-02-21: qty 1

## 2024-02-21 MED ORDER — PROMETHAZINE HCL 25 MG PO TABS
25.0000 mg | ORAL_TABLET | Freq: Once | ORAL | Status: AC
  Administered 2024-02-21: 25 mg via ORAL
  Filled 2024-02-21: qty 1

## 2024-02-21 MED ORDER — HEPARIN SOD (PORK) LOCK FLUSH 100 UNIT/ML IV SOLN
500.0000 [IU] | Freq: Once | INTRAVENOUS | Status: AC | PRN
  Administered 2024-02-21: 500 [IU]

## 2024-02-21 MED ORDER — SODIUM CHLORIDE 0.9 % IV SOLN
150.0000 mg | Freq: Once | INTRAVENOUS | Status: AC
  Administered 2024-02-21: 150 mg via INTRAVENOUS
  Filled 2024-02-21: qty 150

## 2024-02-21 MED ORDER — DEXTROSE 5 % IV SOLN
Freq: Once | INTRAVENOUS | Status: AC
Start: 2024-02-21 — End: 2024-02-21

## 2024-02-21 MED ORDER — DIPHENHYDRAMINE HCL 25 MG PO CAPS
25.0000 mg | ORAL_CAPSULE | Freq: Once | ORAL | Status: AC
Start: 2024-02-21 — End: 2024-02-21
  Administered 2024-02-21: 25 mg via ORAL
  Filled 2024-02-21: qty 1

## 2024-02-21 MED ORDER — FAM-TRASTUZUMAB DERUXTECAN-NXKI CHEMO 100 MG IV SOLR
300.0000 mg | Freq: Once | INTRAVENOUS | Status: AC
  Administered 2024-02-21: 300 mg via INTRAVENOUS
  Filled 2024-02-21: qty 15

## 2024-02-21 NOTE — Progress Notes (Signed)
 Continue Enhertu  300mg  despite weight change.  Krystal [name] [place], [place], BCPS, BCOP 02/21/2024 3:42 PM

## 2024-02-21 NOTE — Patient Instructions (Signed)

## 2024-02-21 NOTE — Progress Notes (Signed)
 Patient Care Team: Patient, [name] [name], MD as Medical Oncologist (Hematology and Oncology) [name], MD as Consulting Physician (Radiation Oncology) [name], NP as Nurse Practitioner (Hematology and Oncology) [name], MD as Consulting Physician (General Surgery) [name], RPH-CPP as Pharmacist (Hematology and Oncology)  DIAGNOSIS:  Encounter Diagnosis  Name Primary?   Malignant neoplasm of central portion of left breast in female, estrogen receptor positive (HCC) Yes    SUMMARY OF ONCOLOGIC HISTORY: Oncology History  Cancer of central portion of left female breast (HCC)  04/02/2014 Genetic Testing   BRCA1 [id number] pathogenic mutation identified on gene sequce and del/dup analyses of BRCA1 and BRCA2.  BRCA1 [id number] VUS identified as well.  The report date is 04/02/2014.  UPDATE: BRCA1 [id number] VUS has been reclassified as a likely benign variant.  The reclassification date is 04/21/2020.   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 N0 M0 stage IA: Oncotype DX 13 (ROR 9%)   04/16/2014 Surgery   Left upper back melanoma resected no residual melanoma identified on re-resection margins negative   05/26/2014 - 06/08/2015 Anti-estrogen oral therapy   Tamoxifen  20 mg daily with a plan to switch her to aromatase inhibitors once she gets oophorectomy ( patient did not continue antiestrogen therapy by choice)   08/13/2014 Surgery   oophorectomy with total abdominal hysterectomy   09/08/2015 -  Anti-estrogen oral therapy   Resumed anastrozole  after she found recurrence of breast cancer; stopped in January 2018, started letrozole  2.5 mg daily 10/12/2016; switched to Exemestane  25 mg daily on 01/03/17   09/16/2015 Surgery   Skin, left:  IDC grade 3; 0.5 cm, margins negative, ER 95%, PR 10%, HER-2 positive ratio 2.08   11/08/2015 - 02/2017 Chemotherapy   Herceptin  every 3 weeks plus anastrozole    11/24/2015 - 01/14/2016 Radiation Therapy   Adjuvant radiation therapy by Dr. Shannon   01/2017 -  Anti-estrogen oral therapy   Exemestane  daily (stopped others due to side effects), due to muscle aches and pains switch to tamoxifen  5 mg daily starting 12/18/2017   08/07/2019 Relapse/Recurrence   Bone scan: Sclerotic metastatic lesions involving left femoral neck, right ninth rib and entire L5 vertebra; MRI left hip: Diffuse signal abnormality L5 vertebral body with possible extraosseous extension   08/22/2019 - 09/03/2019 Radiation Therapy   Palliative radiation with Dr. Shannon to L5 and left proixmal femur.    10/2021 Treatment Plan Change   Fulvestrant  and Verzenio    08/23/2022 Imaging   CT CAP 08/23/2022: New and enlarged pulmonary nodule consistent with progression.  Enlarged small low cervical and mediastinal nodes.  Multiple bone metastases minimally progressive.  Liver lesions evaluation is challenging because of hepatic steatosis with suspected liver progression, isolated tiny omental nodule     09/07/2022 PET scan   IMPRESSION: 1. Worsening of disease now with nodal, pulmonary and hepatic metastases. 2. Areas of variable FDG uptake with some new bone lesions and some areas of bony metastasis which are more focal, for instance in the pelvis where there was likely diffuse involvement on prior imaging. Many of the areas of sclerosis do not show substantial uptake as outlined above and some areas show improvement/resolution of FDG uptake seen previously but there are signs of active bony metastatic disease currently. 3. Likely LEFT adrenal adenoma. 4. Severe  hepatic steatosis. 5. Aortic atherosclerosis.   Primary malignant neoplasm of breast with metastasis (HCC)  08/25/2019 Initial Diagnosis   Primary malignant neoplasm of  breast with metastasis (HCC)   10/11/2022 -  Chemotherapy   Patient is on Treatment Plan : BREAST METASTATIC Fam-Trastuzumab Deruxtecan-nxki  (Enhertu ) (5.4) q21d       CHIEF COMPLIANT: Follow-up on Enhertu   HISTORY OF PRESENT ILLNESS:  History of Present Illness Krystal [name] is a 58 year old female who presents for follow-up regarding radiation treatment for her eye condition. She was referred by a retinal specialist for evaluation and management of her eye condition.  She is scheduled to start radiation treatment on February 26, 2024, following a mask fitting on February 18, 2024. She is concerned about the risk of losing her eye. Visual disturbances include seeing a 'smiley face' shape, particularly in the mornings, and progressive worsening of vision. She manages work by closing one eye to focus better.  She has been on a treatment regimen every three weeks and notes a slight anemia with a hemoglobin level of 11.4. She questions whether resistance to the N HER2 medication could develop.     ALLERGIES:  is allergic to dilaudid  [hydromorphone ] and codeine.  MEDICATIONS:  Current Outpatient Medications  Medication Sig Dispense Refill   ALPRAZolam  (XANAX ) 0.25 MG tablet Take 1 tablet (0.25 mg total) by mouth at bedtime as needed for anxiety. 30 tablet 3   aspirin  EC 81 MG tablet Take 1 tablet (81 mg total) by mouth daily. Swallow whole. 90 tablet 3   aspirin -acetaminophen -caffeine (EXCEDRIN MIGRAINE) 250-250-65 MG tablet Take 2 tablets by mouth every 6 (six) hours as needed for headache.     CALCIUM  PO Take 1,200 mg by mouth 2 (two) times daily.     dexamethasone  (DECADRON ) 4 MG tablet Take 1 tablets (4 mg) by mouth daily for 3 days after chemotherapy. Take with food. 30 tablet 1   esomeprazole (NEXIUM) 40 MG capsule Take 40 mg by mouth daily.     lidocaine -prilocaine  (EMLA ) cream Apply to affected area once 30 g 3   loperamide (IMODIUM) 2 MG capsule Take 2 mg by mouth daily as needed for  diarrhea or loose stools. Take 2 tablets at first diarrhea and then 1 tablet after additional loose stool. Max number in 24 hours is 8 tablets.     loratadine (CLARITIN) 10 MG tablet Take 10 mg by mouth daily.     meloxicam  (MOBIC ) 15 MG tablet TAKE 1 TABLET(15 MG) BY MOUTH DAILY AS NEEDED FOR PAIN 30 tablet 1   ondansetron  (ZOFRAN ) 8 MG tablet Take 1 tablet (8 mg total) by mouth every 8 (eight) hours as needed for nausea or vomiting. Start on the third day after chemotherapy. 30 tablet 1   prochlorperazine  (COMPAZINE ) 10 MG tablet Take 1 tablet (10 mg total) by mouth every 6 (six) hours as needed for nausea or vomiting. 30 tablet 1   rosuvastatin  (CRESTOR ) 10 MG tablet Take 1 tablet (10 mg total) by mouth daily. 90 tablet 3   traZODone  (DESYREL ) 50 MG tablet TAKE 1 TABLET(50 MG) BY MOUTH AT BEDTIME 90 tablet 3   venlafaxine  XR (EFFEXOR -XR) 75 MG 24 hr capsule TAKE 1 CAPSULE(75 MG) BY MOUTH AT BEDTIME 90 capsule 3   No current facility-administered medications for this visit.    PHYSICAL EXAMINATION: ECOG PERFORMANCE STATUS: 1 - Symptomatic but completely ambulatory  Vitals:   02/21/24 1446  BP: (!) 145/78  Pulse: 83  Resp: 18  Temp:  98.4 F (36.9 C)  SpO2: 98%   Filed Weights   02/21/24 1446  Weight: 237 lb 3.2 oz (107.6 kg)    LABORATORY DATA:  I have reviewed the data as listed    Latest Ref Rng & Units 01/25/2024    3:03 PM 12/27/2023    2:06 PM 11/29/2023   12:24 PM  CMP  Glucose 70 - 99 mg/dL 843  89  897   BUN 6 - 20 mg/dL 10  10  11    Creatinine 0.44 - 1.00 mg/dL 9.32  9.36  9.37   Sodium 135 - 145 mmol/L 141  142  141   Potassium 3.5 - 5.1 mmol/L 4.5  4.2  4.1   Chloride 98 - 111 mmol/L 104  105  106   CO2 22 - 32 mmol/L 32  30  29   Calcium  8.9 - 10.3 mg/dL 9.1  9.1  9.1   Total Protein 6.5 - 8.1 g/dL 7.0  6.9  6.8   Total Bilirubin 0.0 - 1.2 mg/dL 0.3  0.4  0.5   Alkaline Phos 38 - 126 U/L 75  71  69   AST 15 - 41 U/L 29  29  33   ALT 0 - 44 U/L 38  36  38      Lab Results  Component Value Date   WBC 3.9 (L) 02/21/2024   HGB 11.4 (L) 02/21/2024   HCT 34.7 (L) 02/21/2024   MCV 92.5 02/21/2024   PLT 196 02/21/2024   NEUTROABS 2.0 02/21/2024    ASSESSMENT & PLAN:  Cancer of central portion of left female breast (HCC) 08/22/2019: PET CT scan: Metastatic disease in the chest with multiple lung nodules all subcentimeter size and hypermetabolic lymph nodes (right paratracheal node 7 mm, subcarinal node 1 cm, right hilar node 6 mm), retroperitoneal and upper pelvic common iliac lymph nodes, bone metastases especially L5, L4, T6, right 10th rib, left proximal femur   Bone biopsy was not felt to be reliable in addition to the effect of radiation on the bone. Palliative radiation completed 09/03/2019 Guardant 360: BRCA1 mutation, TMB 5.36, MSI: Normal   Prior treatment: Ibrance  with letrozole  and Xgeva for bone metastases switched to abemaciclib  and Fulvestrant    Bone metastasis: Patient does not want to take bisphosphonates CT CAP 08/23/2022: New and enlarged pulmonary nodule consistent with progression.  Enlarged small low cervical and mediastinal nodes.  Multiple bone metastases minimally progressive.  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  Skull metastases, abnormal enhancement left globe  ---------------------------------------------------------------- Current treatment:: Enhertu  cycle 22 Enhertu  toxicities: Fatigue    CT CAP 10/11/2023: Continued decrease in the pulmonary metastases, no significant change in the liver lesions, unchanged bone metastases   Dr. Buckley recommended continued  MRI surveillance    Ocular issues: Patient developed floaters in the left eye about a month ago and ophthalmology evaluation revealed 3.6 cm subcu nodule mass.  This was originally detected on MRI in April 2024.  Subsequent MRIs have not commented on it. Patient saw Dr. Patrcia who is planning to do palliative radiation to the eye.  Plan to obtain CT CAP in September and follow-up after that Scans every 6 monthsTreatment  plan: Switching her Enhertu  treatments back to every 3 weeks ------------------------------------- Assessment and Plan Assessment & Plan Malignant neoplasm of central portion of left breast Estrogen receptor positive breast cancer under treatment with N HER2, no resistance or progression. - Order whole-body scan in August to assess disease status. - Continue current treatment with N HER2.  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 - Start radiation therapy on February 26, 2024. - Follow up with retinal specialist one month after radiation therapy completion.  Anemia, unspecified Mild anemia with hemoglobin level of 11.4, no immediate intervention required. - Monitor hemoglobin levels.      Orders Placed This Encounter  Procedures   CT CHEST ABDOMEN PELVIS W CONTRAST    Standing Status:   Future    Expected Date:   04/03/2024    Expiration Date:   02/20/2025    If indicated for the ordered procedure, I authorize the administration of contrast media per Radiology protocol:   Yes    Does the patient have a contrast media/X-ray dye allergy?:   No    Preferred imaging location?:   [facility]    Release to patient:   Immediate    If indicated for the ordered procedure, I authorize the administration of oral contrast media per Radiology protocol:   No    Reason for no oral contrast::   no need   The patient has a good understanding of the overall plan. she agrees with  it. she will call with any problems that may develop before the next visit here. Total time spent: 30 mins including face to face time and time spent for planning, charting and co-ordination of care   Viinay K Roena Sassaman, MD 02/21/24

## 2024-02-21 NOTE — Assessment & Plan Note (Signed)
 08/22/2019: PET CT scan: Metastatic disease in the chest with multiple lung nodules all subcentimeter size and hypermetabolic lymph nodes (right paratracheal node 7 mm, subcarinal node 1 cm, right hilar node 6 mm), retroperitoneal and upper pelvic common iliac lymph nodes, bone metastases especially L5, L4, T6, right 10th rib, left proximal femur   Bone biopsy was not felt to be reliable in addition to the effect of radiation on the bone. Palliative radiation completed 09/03/2019 [facility] 360: BRCA1 mutation, TMB 5.36, MSI: Normal   Prior treatment: Ibrance  with letrozole  and Xgeva for bone metastases switched to abemaciclib  and Fulvestrant    Bone metastasis: Patient does not want to take bisphosphonates CT CAP 08/23/2022: New and enlarged pulmonary nodule consistent with progression.  Enlarged small low cervical and mediastinal nodes.  Multiple bone metastases minimally progressive.  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  Skull metastases, abnormal enhancement left globe  ---------------------------------------------------------------- Current treatment:: Enhertu  cycle 22 Enhertu  toxicities: Fatigue    CT CAP 10/11/2023: Continued decrease in the pulmonary metastases, no significant change in the liver lesions, unchanged bone metastases   Dr. Buckley recommended continued MRI surveillance    Ocular issues: Patient developed floaters in the left eye about a month ago and ophthalmology evaluation revealed 3.6 cm subcu nodule mass.  This was originally detected on MRI in April 2024.  Subsequent MRIs have not commented on it. Patient saw Dr.  Patrcia who is planning to do palliative radiation to the eye. Scans every 6 monthsTreatment plan: Switching her Enhertu  treatments back to every 3 weeks

## 2024-02-23 ENCOUNTER — Other Ambulatory Visit: Payer: Self-pay

## 2024-02-26 ENCOUNTER — Other Ambulatory Visit: Payer: Self-pay

## 2024-02-26 ENCOUNTER — Ambulatory Visit
Admission: RE | Admit: 2024-02-26 | Discharge: 2024-02-26 | Discharge status: Home / Self Care | Source: Ambulatory Visit | Attending: Radiation Oncology

## 2024-02-26 DIAGNOSIS — C7949 Secondary malignant neoplasm of other parts of nervous system: Secondary | ICD-10-CM | POA: Diagnosis not present

## 2024-02-26 LAB — RAD ONC ARIA SESSION SUMMARY
Course Elapsed Days: 0
Plan Fractions Treated to Date: 1
Plan Prescribed Dose Per Fraction: 3 Gy
Plan Total Fractions Prescribed: 10
Plan Total Prescribed Dose: 30 Gy
Reference Point Dosage Given to Date: 3 Gy
Reference Point Session Dosage Given: 3 Gy
Session Number: 1

## 2024-02-27 ENCOUNTER — Ambulatory Visit
Admission: RE | Admit: 2024-02-27 | Discharge: 2024-02-27 | Disposition: A | Discharge status: Home / Self Care | Source: Ambulatory Visit | Attending: Radiation Oncology

## 2024-02-27 ENCOUNTER — Other Ambulatory Visit: Payer: Self-pay

## 2024-02-27 DIAGNOSIS — C7949 Secondary malignant neoplasm of other parts of nervous system: Secondary | ICD-10-CM | POA: Diagnosis not present

## 2024-02-27 LAB — RAD ONC ARIA SESSION SUMMARY
Course Elapsed Days: 1
Plan Fractions Treated to Date: 2
Plan Prescribed Dose Per Fraction: 3 Gy
Plan Total Fractions Prescribed: 10
Plan Total Prescribed Dose: 30 Gy
Reference Point Dosage Given to Date: 6 Gy
Reference Point Session Dosage Given: 3 Gy
Session Number: 2

## 2024-02-28 ENCOUNTER — Ambulatory Visit
Admission: RE | Admit: 2024-02-28 | Discharge: 2024-02-28 | Disposition: A | Discharge status: Home / Self Care | Source: Ambulatory Visit | Attending: Radiation Oncology | Admitting: Radiation Oncology

## 2024-02-28 ENCOUNTER — Other Ambulatory Visit: Payer: Self-pay

## 2024-02-28 ENCOUNTER — Ambulatory Visit
Admission: RE | Admit: 2024-02-28 | Discharge: 2024-02-28 | Disposition: A | Discharge status: Home / Self Care | Source: Ambulatory Visit | Attending: Radiation Oncology

## 2024-02-28 DIAGNOSIS — C7949 Secondary malignant neoplasm of other parts of nervous system: Secondary | ICD-10-CM | POA: Diagnosis not present

## 2024-02-28 LAB — RAD ONC ARIA SESSION SUMMARY
Course Elapsed Days: 2
Plan Fractions Treated to Date: 3
Plan Prescribed Dose Per Fraction: 3 Gy
Plan Total Fractions Prescribed: 10
Plan Total Prescribed Dose: 30 Gy
Reference Point Dosage Given to Date: 9 Gy
Reference Point Session Dosage Given: 3 Gy
Session Number: 3

## 2024-02-29 ENCOUNTER — Other Ambulatory Visit: Payer: Self-pay

## 2024-02-29 ENCOUNTER — Ambulatory Visit
Admission: RE | Admit: 2024-02-29 | Discharge: 2024-02-29 | Disposition: A | Discharge status: Home / Self Care | Source: Ambulatory Visit | Attending: Radiation Oncology | Admitting: Radiation Oncology

## 2024-02-29 DIAGNOSIS — C7949 Secondary malignant neoplasm of other parts of nervous system: Secondary | ICD-10-CM | POA: Diagnosis not present

## 2024-02-29 LAB — RAD ONC ARIA SESSION SUMMARY
Course Elapsed Days: 3
Plan Fractions Treated to Date: 4
Plan Prescribed Dose Per Fraction: 3 Gy
Plan Total Fractions Prescribed: 10
Plan Total Prescribed Dose: 30 Gy
Reference Point Dosage Given to Date: 12 Gy
Reference Point Session Dosage Given: 3 Gy
Session Number: 4

## 2024-03-03 ENCOUNTER — Other Ambulatory Visit: Payer: Self-pay

## 2024-03-03 ENCOUNTER — Ambulatory Visit
Admission: RE | Admit: 2024-03-03 | Discharge: 2024-03-03 | Disposition: A | Discharge status: Home / Self Care | Source: Ambulatory Visit | Attending: Radiation Oncology | Admitting: Radiation Oncology

## 2024-03-03 DIAGNOSIS — C7949 Secondary malignant neoplasm of other parts of nervous system: Secondary | ICD-10-CM | POA: Diagnosis not present

## 2024-03-03 LAB — RAD ONC ARIA SESSION SUMMARY
Course Elapsed Days: 6
Plan Fractions Treated to Date: 5
Plan Prescribed Dose Per Fraction: 3 Gy
Plan Total Fractions Prescribed: 10
Plan Total Prescribed Dose: 30 Gy
Reference Point Dosage Given to Date: 15 Gy
Reference Point Session Dosage Given: 3 Gy
Session Number: 5

## 2024-03-04 ENCOUNTER — Ambulatory Visit
Admission: RE | Admit: 2024-03-04 | Discharge: 2024-03-04 | Disposition: A | Discharge status: Home / Self Care | Source: Ambulatory Visit | Attending: Radiation Oncology | Admitting: Radiation Oncology

## 2024-03-04 ENCOUNTER — Other Ambulatory Visit: Payer: Self-pay

## 2024-03-04 DIAGNOSIS — C7949 Secondary malignant neoplasm of other parts of nervous system: Secondary | ICD-10-CM | POA: Diagnosis not present

## 2024-03-04 LAB — RAD ONC ARIA SESSION SUMMARY
Course Elapsed Days: 7
Plan Fractions Treated to Date: 6
Plan Prescribed Dose Per Fraction: 3 Gy
Plan Total Fractions Prescribed: 10
Plan Total Prescribed Dose: 30 Gy
Reference Point Dosage Given to Date: 18 Gy
Reference Point Session Dosage Given: 3 Gy
Session Number: 6

## 2024-03-05 ENCOUNTER — Ambulatory Visit
Admission: RE | Admit: 2024-03-05 | Discharge: 2024-03-05 | Disposition: A | Discharge status: Home / Self Care | Source: Ambulatory Visit | Attending: Radiation Oncology | Admitting: Radiation Oncology

## 2024-03-05 ENCOUNTER — Other Ambulatory Visit: Payer: Self-pay

## 2024-03-05 DIAGNOSIS — C7949 Secondary malignant neoplasm of other parts of nervous system: Secondary | ICD-10-CM | POA: Diagnosis not present

## 2024-03-05 LAB — RAD ONC ARIA SESSION SUMMARY
Course Elapsed Days: 8
Plan Fractions Treated to Date: 7
Plan Prescribed Dose Per Fraction: 3 Gy
Plan Total Fractions Prescribed: 10
Plan Total Prescribed Dose: 30 Gy
Reference Point Dosage Given to Date: 21 Gy
Reference Point Session Dosage Given: 3 Gy
Session Number: 7

## 2024-03-06 ENCOUNTER — Ambulatory Visit
Admission: RE | Admit: 2024-03-06 | Discharge: 2024-03-06 | Disposition: A | Discharge status: Home / Self Care | Source: Ambulatory Visit | Attending: Radiation Oncology | Admitting: Radiation Oncology

## 2024-03-06 ENCOUNTER — Other Ambulatory Visit: Payer: Self-pay

## 2024-03-06 ENCOUNTER — Ambulatory Visit
Admission: RE | Admit: 2024-03-06 | Discharge: 2024-03-06 | Disposition: A | Discharge status: Home / Self Care | Source: Ambulatory Visit | Attending: Radiation Oncology

## 2024-03-06 DIAGNOSIS — C7949 Secondary malignant neoplasm of other parts of nervous system: Secondary | ICD-10-CM | POA: Diagnosis not present

## 2024-03-06 LAB — RAD ONC ARIA SESSION SUMMARY
Course Elapsed Days: 9
Plan Fractions Treated to Date: 8
Plan Prescribed Dose Per Fraction: 3 Gy
Plan Total Fractions Prescribed: 10
Plan Total Prescribed Dose: 30 Gy
Reference Point Dosage Given to Date: 24 Gy
Reference Point Session Dosage Given: 3 Gy
Session Number: 8

## 2024-03-07 ENCOUNTER — Ambulatory Visit

## 2024-03-07 ENCOUNTER — Other Ambulatory Visit: Payer: Self-pay

## 2024-03-07 ENCOUNTER — Ambulatory Visit
Admission: RE | Admit: 2024-03-07 | Discharge: 2024-03-07 | Disposition: A | Discharge status: Home / Self Care | Source: Ambulatory Visit | Attending: Radiation Oncology | Admitting: Radiation Oncology

## 2024-03-07 DIAGNOSIS — Z170 Estrogen receptor positive status [ER+]: Secondary | ICD-10-CM | POA: Insufficient documentation

## 2024-03-07 DIAGNOSIS — C7949 Secondary malignant neoplasm of other parts of nervous system: Secondary | ICD-10-CM | POA: Diagnosis present

## 2024-03-07 DIAGNOSIS — Z510 Encounter for antineoplastic radiation therapy: Secondary | ICD-10-CM | POA: Insufficient documentation

## 2024-03-07 DIAGNOSIS — C7931 Secondary malignant neoplasm of brain: Secondary | ICD-10-CM | POA: Diagnosis not present

## 2024-03-07 DIAGNOSIS — C7951 Secondary malignant neoplasm of bone: Secondary | ICD-10-CM | POA: Diagnosis not present

## 2024-03-07 DIAGNOSIS — C50112 Malignant neoplasm of central portion of left female breast: Secondary | ICD-10-CM | POA: Insufficient documentation

## 2024-03-07 LAB — RAD ONC ARIA SESSION SUMMARY
Course Elapsed Days: 10
Plan Fractions Treated to Date: 9
Plan Prescribed Dose Per Fraction: 3 Gy
Plan Total Fractions Prescribed: 10
Plan Total Prescribed Dose: 30 Gy
Reference Point Dosage Given to Date: 27 Gy
Reference Point Session Dosage Given: 3 Gy
Session Number: 9

## 2024-03-10 ENCOUNTER — Ambulatory Visit

## 2024-03-10 ENCOUNTER — Ambulatory Visit
Admission: RE | Admit: 2024-03-10 | Discharge: 2024-03-10 | Disposition: A | Discharge status: Home / Self Care | Source: Ambulatory Visit | Attending: Radiation Oncology | Admitting: Radiation Oncology

## 2024-03-10 ENCOUNTER — Other Ambulatory Visit: Payer: Self-pay

## 2024-03-10 DIAGNOSIS — C6932 Malignant neoplasm of left choroid: Secondary | ICD-10-CM

## 2024-03-10 DIAGNOSIS — C7949 Secondary malignant neoplasm of other parts of nervous system: Secondary | ICD-10-CM | POA: Diagnosis not present

## 2024-03-10 LAB — RAD ONC ARIA SESSION SUMMARY
Course Elapsed Days: 13
Plan Fractions Treated to Date: 10
Plan Prescribed Dose Per Fraction: 3 Gy
Plan Total Fractions Prescribed: 10
Plan Total Prescribed Dose: 30 Gy
Reference Point Dosage Given to Date: 30 Gy
Reference Point Session Dosage Given: 3 Gy
Session Number: 10

## 2024-03-11 NOTE — Radiation Completion Notes (Addendum)
  Radiation Oncology         [phone] ________________________________  Name: Krystal [name] MRN: [id number]  Date: 03/10/2024  DOB: 1966/02/24  Referring Physician: MACKEY CHAD, M.D. Date of Service: 2024-03-11 Radiation Oncologist: Adina Barge, M.D.  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   Intent: Palliative     ==========DELIVERED PLANS==========  First Treatment Date: 2024-02-26 Last Treatment Date: 2024-03-10   Plan Name: HN_L_Orbit Site: Eye, Left Technique: 3D Mode: Photon Dose Per Fraction: 3 Gy Prescribed Dose (Delivered / Prescribed): 30 Gy / 30 Gy Prescribed Fxs (Delivered / Prescribed): 10 / 10     ==========ON TREATMENT VISIT DATES========== 2024-02-28, 2024-03-06   See weekly On Treatment Notes in Epic for details in the Media tab (listed as Progress notes on the On Treatment Visit Dates listed above).  She tolerated the daily treatments well with only modest fatigue.   The patient will receive a call in about one month from the radiation oncology department. She will continue follow up with her medical oncologist, Dr. CHAD, and neuro-oncologist, Dr. Buckley, as well.  She is scheduled for a posttreatment MRI brain scan on 04/18/2024 and a follow-up visit with Dr. Buckley thereafter.  ------------------------------------------------   Donnice Barge, MD [facility]  Radiation Oncology Direct Dial: [phone]  Fax: [phone] [email or web address]  Skype  [email or web address]

## 2024-03-12 NOTE — Assessment & Plan Note (Signed)
 08/22/2019: PET CT scan: Metastatic disease in the chest with multiple lung nodules all subcentimeter size and hypermetabolic lymph nodes (right paratracheal node 7 mm, subcarinal node 1 cm, right hilar node 6 mm), retroperitoneal and upper pelvic common iliac lymph nodes, bone metastases especially L5, L4, T6, right 10th rib, left proximal femur   Bone biopsy was not felt to be reliable in addition to the effect of radiation on the bone. Palliative radiation completed 09/03/2019 [facility] 360: BRCA1 mutation, TMB 5.36, MSI: Normal   Prior treatment: Ibrance  with letrozole  and Xgeva for bone metastases switched to abemaciclib  and Fulvestrant    Bone metastasis: Patient does not want to take bisphosphonates CT CAP 08/23/2022: New and enlarged pulmonary nodule consistent with progression.  Enlarged small low cervical and mediastinal nodes.  Multiple bone metastases minimally progressive.  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  Skull metastases, abnormal enhancement left globe  ---------------------------------------------------------------- Current treatment:: Enhertu  cycle 22 Enhertu  toxicities: Fatigue    CT CAP 10/11/2023: Continued decrease in the pulmonary metastases, no significant change in the liver lesions, unchanged bone metastases   Dr. Buckley recommended continued MRI surveillance     Ocular issues: Patient developed floaters in the left eye about a month ago and ophthalmology evaluation revealed 3.6 cm subcu nodule mass.  This was originally detected on MRI in April 2024.  Subsequent MRIs have not commented on it. Patient saw Dr.  Patrcia who is planning to do palliative radiation to the eye.   Plan to obtain CT CAP in September and follow-up after that Scans every 6 monthsTreatment plan: Switching her Enhertu  treatments back to every 3 weeks

## 2024-03-13 ENCOUNTER — Other Ambulatory Visit: Payer: Self-pay

## 2024-03-13 ENCOUNTER — Inpatient Hospital Stay (HOSPITAL_BASED_OUTPATIENT_CLINIC_OR_DEPARTMENT_OTHER): Admitting: Hematology and Oncology

## 2024-03-13 ENCOUNTER — Inpatient Hospital Stay: Attending: Hematology and Oncology

## 2024-03-13 ENCOUNTER — Inpatient Hospital Stay

## 2024-03-13 VITALS — BP 117/65 | HR 72 | Temp 97.3°F | Resp 18 | Ht 64.0 in | Wt 239.5 lb

## 2024-03-13 DIAGNOSIS — Z9013 Acquired absence of bilateral breasts and nipples: Secondary | ICD-10-CM | POA: Diagnosis not present

## 2024-03-13 DIAGNOSIS — Z1509 Genetic susceptibility to other malignant neoplasm: Secondary | ICD-10-CM | POA: Diagnosis not present

## 2024-03-13 DIAGNOSIS — Z1501 Genetic susceptibility to malignant neoplasm of breast: Secondary | ICD-10-CM | POA: Insufficient documentation

## 2024-03-13 DIAGNOSIS — C50112 Malignant neoplasm of central portion of left female breast: Secondary | ICD-10-CM | POA: Diagnosis present

## 2024-03-13 DIAGNOSIS — Z85820 Personal history of malignant melanoma of skin: Secondary | ICD-10-CM | POA: Diagnosis not present

## 2024-03-13 DIAGNOSIS — C7931 Secondary malignant neoplasm of brain: Secondary | ICD-10-CM | POA: Insufficient documentation

## 2024-03-13 DIAGNOSIS — Z923 Personal history of irradiation: Secondary | ICD-10-CM | POA: Insufficient documentation

## 2024-03-13 DIAGNOSIS — R5383 Other fatigue: Secondary | ICD-10-CM | POA: Diagnosis not present

## 2024-03-13 DIAGNOSIS — C7951 Secondary malignant neoplasm of bone: Secondary | ICD-10-CM | POA: Diagnosis not present

## 2024-03-13 DIAGNOSIS — C779 Secondary and unspecified malignant neoplasm of lymph node, unspecified: Secondary | ICD-10-CM | POA: Diagnosis not present

## 2024-03-13 DIAGNOSIS — Z5112 Encounter for antineoplastic immunotherapy: Secondary | ICD-10-CM | POA: Insufficient documentation

## 2024-03-13 DIAGNOSIS — C50919 Malignant neoplasm of unspecified site of unspecified female breast: Secondary | ICD-10-CM

## 2024-03-13 DIAGNOSIS — I700 Atherosclerosis of aorta: Secondary | ICD-10-CM | POA: Insufficient documentation

## 2024-03-13 DIAGNOSIS — Z7982 Long term (current) use of aspirin: Secondary | ICD-10-CM | POA: Diagnosis not present

## 2024-03-13 DIAGNOSIS — Z95828 Presence of other vascular implants and grafts: Secondary | ICD-10-CM

## 2024-03-13 DIAGNOSIS — K760 Fatty (change of) liver, not elsewhere classified: Secondary | ICD-10-CM | POA: Diagnosis not present

## 2024-03-13 DIAGNOSIS — Z79811 Long term (current) use of aromatase inhibitors: Secondary | ICD-10-CM | POA: Insufficient documentation

## 2024-03-13 DIAGNOSIS — Z7952 Long term (current) use of systemic steroids: Secondary | ICD-10-CM | POA: Diagnosis not present

## 2024-03-13 DIAGNOSIS — Z79899 Other long term (current) drug therapy: Secondary | ICD-10-CM | POA: Insufficient documentation

## 2024-03-13 DIAGNOSIS — Z170 Estrogen receptor positive status [ER+]: Secondary | ICD-10-CM

## 2024-03-13 LAB — CMP (CANCER CENTER ONLY)
ALT: 41 U/L (ref 0–44)
AST: 25 U/L (ref 15–41)
Albumin: 4.1 g/dL (ref 3.5–5.0)
Alkaline Phosphatase: 72 U/L (ref 38–126)
Anion gap: 7 (ref 5–15)
BUN: 9 mg/dL (ref 6–20)
CO2: 30 mmol/L (ref 22–32)
Calcium: 9.2 mg/dL (ref 8.9–10.3)
Chloride: 104 mmol/L (ref 98–111)
Creatinine: 0.62 mg/dL (ref 0.44–1.00)
GFR, Estimated: >60 mL/min (ref >60)
Glucose, Bld: 119 mg/dL — ABNORMAL HIGH (ref 70–99)
Potassium: 4 mmol/L (ref 3.5–5.1)
Sodium: 141 mmol/L (ref 135–145)
Total Bilirubin: 0.3 mg/dL (ref 0.0–1.2)
Total Protein: 6.8 g/dL (ref 6.5–8.1)

## 2024-03-13 LAB — CBC WITH DIFFERENTIAL (CANCER CENTER ONLY)
Abs Immature Granulocytes: 0.01 K/uL (ref 0.00–0.07)
Basophils Absolute: 0 K/uL (ref 0.0–0.1)
Basophils Relative: 1 %
Eosinophils Absolute: 0.2 K/uL (ref 0.0–0.5)
Eosinophils Relative: 5 %
HCT: 37.1 % (ref 36.0–46.0)
Hemoglobin: 12.6 g/dL (ref 12.0–15.0)
Immature Granulocytes: 0 %
Lymphocytes Relative: 24 %
Lymphs Abs: 1 K/uL (ref 0.7–4.0)
MCH: 30.6 pg (ref 26.0–34.0)
MCHC: 34 g/dL (ref 30.0–36.0)
MCV: 90 fL (ref 80.0–100.0)
Monocytes Absolute: 0.4 K/uL (ref 0.1–1.0)
Monocytes Relative: 9 %
Neutro Abs: 2.6 K/uL (ref 1.7–7.7)
Neutrophils Relative %: 61 %
Platelet Count: 237 K/uL (ref 150–400)
RBC: 4.12 MIL/uL (ref 3.87–5.11)
RDW: 13.9 % (ref 11.5–15.5)
WBC Count: 4.2 K/uL (ref 4.0–10.5)
nRBC: 0 % (ref 0.0–0.2)

## 2024-03-13 MED ORDER — DEXAMETHASONE SODIUM PHOSPHATE 10 MG/ML IJ SOLN
10.0000 mg | Freq: Once | INTRAMUSCULAR | Status: AC
  Administered 2024-03-13: 10 mg via INTRAVENOUS
  Filled 2024-03-13: qty 1

## 2024-03-13 MED ORDER — DEXTROSE 5 % IV SOLN: Freq: Once | INTRAVENOUS | Status: AC

## 2024-03-13 MED ORDER — ACETAMINOPHEN 325 MG PO TABS
650.0000 mg | ORAL_TABLET | Freq: Once | ORAL | Status: AC
  Administered 2024-03-13: 650 mg via ORAL
  Filled 2024-03-13: qty 2

## 2024-03-13 MED ORDER — DIPHENHYDRAMINE HCL 25 MG PO CAPS
25.0000 mg | ORAL_CAPSULE | Freq: Once | ORAL | Status: AC
  Administered 2024-03-13: 25 mg via ORAL
  Filled 2024-03-13: qty 1

## 2024-03-13 MED ORDER — PROMETHAZINE HCL 25 MG PO TABS
25.0000 mg | ORAL_TABLET | Freq: Once | ORAL | Status: AC
  Administered 2024-03-13: 25 mg via ORAL
  Filled 2024-03-13: qty 1

## 2024-03-13 MED ORDER — FAM-TRASTUZUMAB DERUXTECAN-NXKI CHEMO 100 MG IV SOLR
340.0000 mg | Freq: Once | INTRAVENOUS | Status: AC
  Administered 2024-03-13: 340 mg via INTRAVENOUS
  Filled 2024-03-13: qty 17

## 2024-03-13 MED ORDER — APREPITANT 130 MG/18ML IV EMUL
130.0000 mg | Freq: Once | INTRAVENOUS | Status: AC
  Administered 2024-03-13: 130 mg via INTRAVENOUS
  Filled 2024-03-13: qty 18

## 2024-03-13 MED ORDER — SODIUM CHLORIDE 0.9% FLUSH
10.0000 mL | Freq: Once | INTRAVENOUS | Status: AC
Start: 2024-03-13 — End: 2024-03-13
  Administered 2024-03-13: 10 mL

## 2024-03-13 NOTE — Progress Notes (Signed)
 Okay to increase Enhertu  dose to 340 mg per Dr. Gudena for weight increase and vial size rounding.  Harlene Nasuti, PharmD Oncology Infusion Pharmacist 03/13/2024 4:25 PM

## 2024-03-13 NOTE — Progress Notes (Signed)
 Patient Care Team: Patient, [name] Krystal Potts, MD as Medical Oncologist (Hematology and Oncology) Krystal Agent, MD as Consulting Physician (Radiation Oncology) Krystal Morna Pickle, NP as Nurse Practitioner (Hematology and Oncology) Krystal Cough, MD as Consulting Physician (General Surgery) Krystal [name], RPH-CPP as Pharmacist (Hematology and Oncology)  DIAGNOSIS:  Encounter Diagnosis  Name Primary?   Malignant neoplasm of central portion of left breast in female, estrogen receptor positive (HCC) Yes    SUMMARY OF ONCOLOGIC HISTORY: Oncology History  Cancer of central portion of left female breast (HCC)  04/02/2014 Genetic Testing   BRCA1 [id number] pathogenic mutation identified on gene sequce and del/dup analyses of BRCA1 and BRCA2.  BRCA1 p.H1283R VUS identified as well.  The report date is 04/02/2014.  UPDATE: BRCA1 e.Y8716M VUS has been reclassified as a likely benign variant.  The reclassification date is 04/21/2020.   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 N0 M0 stage IA: Oncotype DX 13 (ROR 9%)   04/16/2014 Surgery   Left upper back melanoma resected no residual melanoma identified on re-resection margins negative   05/26/2014 - 06/08/2015 Anti-estrogen oral therapy   Tamoxifen  20 mg daily with a plan to switch her to aromatase inhibitors once she gets oophorectomy ( patient did not continue antiestrogen therapy by choice)   08/13/2014 Surgery   oophorectomy with total abdominal hysterectomy   09/08/2015 -  Anti-estrogen oral therapy   Resumed anastrozole  after she found recurrence of breast cancer; stopped in January 2018, started letrozole  2.5 mg daily 10/12/2016; switched to Exemestane  25 mg daily on 01/03/17   09/16/2015 Surgery   Skin, left:  IDC grade 3; 0.5 cm, margins negative, ER 95%, PR 10%, HER-2 positive ratio 2.08   11/08/2015 - 02/2017 Chemotherapy   Herceptin  every 3 weeks plus anastrozole    11/24/2015 - 01/14/2016 Radiation Therapy   Adjuvant radiation therapy by Dr. Shannon   01/2017 -  Anti-estrogen oral therapy   Exemestane  daily (stopped others due to side effects), due to muscle aches and pains switch to tamoxifen  5 mg daily starting 12/18/2017   08/07/2019 Relapse/Recurrence   Bone scan: Sclerotic metastatic lesions involving left femoral neck, right ninth rib and entire L5 vertebra; MRI left hip: Diffuse signal abnormality L5 vertebral body with possible extraosseous extension   08/22/2019 - 09/03/2019 Radiation Therapy   Palliative radiation with Dr. Shannon to L5 and left proixmal femur.    10/2021 Treatment Plan Change   Fulvestrant  and Verzenio    08/23/2022 Imaging   CT CAP 08/23/2022: New and enlarged pulmonary nodule consistent with progression.  Enlarged small low cervical and mediastinal nodes.  Multiple bone metastases minimally progressive.  Liver lesions evaluation is challenging because of hepatic steatosis with suspected liver progression, isolated tiny omental nodule     09/07/2022 PET scan   IMPRESSION: 1. Worsening of disease now with nodal, pulmonary and hepatic metastases. 2. Areas of variable FDG uptake with some new bone lesions and some areas of bony metastasis which are more focal, for instance in the pelvis where there was likely diffuse involvement on prior imaging. Many of the areas of sclerosis do not show substantial uptake as outlined above and some areas show improvement/resolution of FDG uptake seen previously but there are signs of active bony metastatic disease currently. 3. Likely LEFT adrenal adenoma. 4. Severe  hepatic steatosis. 5. Aortic atherosclerosis.   Primary malignant neoplasm of breast with metastasis (HCC)  08/25/2019 Initial Diagnosis   Primary malignant neoplasm of  breast with metastasis (HCC)   10/11/2022 -  Chemotherapy   Patient is on Treatment Plan : BREAST METASTATIC Fam-Trastuzumab Deruxtecan-nxki  (Enhertu ) (5.4) q21d       CHIEF COMPLIANT: Enhertu   HISTORY OF PRESENT ILLNESS:   History of Present Illness Krystal [name] is a 58 year old female who presents with visual disturbances post-radiation therapy to the left eye.  She experiences persistent visual disturbances, including seeing 'lights' and distorted vision, particularly when viewing faces. Her vision appears as if 'looking under a little bit of water', affecting depth perception and straight lines. A recent CT scan showed no changes compared to the start of radiation therapy. She notes a bulge in her eye, which may be a normal post-radiation effect. An MRI of the brain is scheduled for September 12th, with a follow-up on September 18th.     ALLERGIES:  is allergic to dilaudid  [hydromorphone ] and codeine.  MEDICATIONS:  Current Outpatient Medications  Medication Sig Dispense Refill   ALPRAZolam  (XANAX ) 0.25 MG tablet Take 1 tablet (0.25 mg total) by mouth at bedtime as needed for anxiety. 30 tablet 3   aspirin  EC 81 MG tablet Take 1 tablet (81 mg total) by mouth daily. Swallow whole. 90 tablet 3   aspirin -acetaminophen -caffeine (EXCEDRIN MIGRAINE) 250-250-65 MG tablet Take 2 tablets by mouth every 6 (six) hours as needed for headache.     CALCIUM  PO Take 1,200 mg by mouth 2 (two) times daily.     dexamethasone  (DECADRON ) 4 MG tablet Take 1 tablets (4 mg) by mouth daily for 3 days after chemotherapy. Take with food. 30 tablet 1   esomeprazole (NEXIUM) 40 MG capsule Take 40 mg by mouth daily.     lidocaine -prilocaine  (EMLA ) cream Apply to affected area once 30 g 3   loperamide (IMODIUM) 2 MG capsule Take 2 mg by mouth daily as needed for diarrhea or loose stools. Take 2 tablets at first diarrhea and then 1 tablet after additional loose stool. Max number in 24 hours is 8 tablets.      loratadine (CLARITIN) 10 MG tablet Take 10 mg by mouth daily.     meloxicam  (MOBIC ) 15 MG tablet TAKE 1 TABLET(15 MG) BY MOUTH DAILY AS NEEDED FOR PAIN 30 tablet 1   ondansetron  (ZOFRAN ) 8 MG tablet Take 1 tablet (8 mg total) by mouth every 8 (eight) hours as needed for nausea or vomiting. Start on the third day after chemotherapy. 30 tablet 1   prochlorperazine  (COMPAZINE ) 10 MG tablet Take 1 tablet (10 mg total) by mouth every 6 (six) hours as needed for nausea or vomiting. 30 tablet 1   rosuvastatin  (CRESTOR ) 10 MG tablet Take 1 tablet (10 mg total) by mouth daily. 90 tablet 3   traZODone  (DESYREL ) 50 MG tablet TAKE 1 TABLET(50 MG) BY MOUTH AT BEDTIME 90 tablet 3   venlafaxine  XR (EFFEXOR -XR) 75 MG 24 hr capsule TAKE 1 CAPSULE(75 MG) BY MOUTH AT BEDTIME 90 capsule 3   No current facility-administered medications for this visit.    PHYSICAL EXAMINATION: ECOG PERFORMANCE STATUS: 1 - Symptomatic but completely ambulatory  Vitals:   03/13/24 1452  BP: 117/65  Pulse: 72  Resp: 18  Temp: (!) 97.3 F (36.3 C)  SpO2: 98%   Filed Weights   03/13/24 1452  Weight: 239 lb 8 oz (108.6 kg)  LABORATORY DATA:  I have reviewed the data as listed    Latest Ref Rng & Units 03/13/2024    2:42 PM 02/21/2024    2:21 PM 01/25/2024    3:03 PM  CMP  Glucose 70 - 99 mg/dL 880  98  843   BUN 6 - 20 mg/dL 9  9  10    Creatinine 0.44 - 1.00 mg/dL 9.37  9.47  9.32   Sodium 135 - 145 mmol/L 141  141  141   Potassium 3.5 - 5.1 mmol/L 4.0  4.0  4.5   Chloride 98 - 111 mmol/L 104  110  104   CO2 22 - 32 mmol/L 30  27  32   Calcium  8.9 - 10.3 mg/dL 9.2  7.9  9.1   Total Protein 6.5 - 8.1 g/dL 6.8  6.1  7.0   Total Bilirubin 0.0 - 1.2 mg/dL 0.3  0.2  0.3   Alkaline Phos 38 - 126 U/L 72  64  75   AST 15 - 41 U/L 25  22  29    ALT 0 - 44 U/L 41  28  38     Lab Results  Component Value Date   WBC 4.2 03/13/2024   HGB 12.6 03/13/2024   HCT 37.1 03/13/2024   MCV 90.0 03/13/2024   PLT 237 03/13/2024    NEUTROABS 2.6 03/13/2024    ASSESSMENT & PLAN:  Cancer of central portion of left female breast (HCC) 08/22/2019: PET CT scan: Metastatic disease in the chest with multiple lung nodules all subcentimeter size and hypermetabolic lymph nodes (right paratracheal node 7 mm, subcarinal node 1 cm, right hilar node 6 mm), retroperitoneal and upper pelvic common iliac lymph nodes, bone metastases especially L5, L4, T6, right 10th rib, left proximal femur   Bone biopsy was not felt to be reliable in addition to the effect of radiation on the bone. Palliative radiation completed 09/03/2019 [facility]: BRCA1 mutation, TMB 5.36, MSI: Normal   Prior treatment: Ibrance  with letrozole  and Xgeva for bone metastases switched to abemaciclib  and Fulvestrant    Bone metastasis: Patient does not want to take bisphosphonates CT CAP 08/23/2022: New and enlarged pulmonary nodule consistent with progression.  Enlarged small low cervical and mediastinal nodes.  Multiple bone metastases minimally progressive.  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  Skull metastases, abnormal enhancement left globe  ---------------------------------------------------------------- Current treatment:: Enhertu  cycle 23 Enhertu  toxicities: Fatigue Monitoring closely for toxicities    CT CAP 10/11/2023: Continued decrease in the pulmonary metastases, no significant change in the liver lesions, unchanged bone metastases   Dr. Buckley recommended continued MRI surveillance     Ocular issues: Patient developed floaters in the left eye about a month ago and ophthalmology evaluation  revealed 3.6 cm subcu nodule mass.  This was originally detected on MRI in April 2024.  Subsequent MRIs have not commented on it. Patient saw Dr. Patrcia who is planning to do palliative radiation to the eye.   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 Vision worsened with distorted depth perception. Radiation effects may take 2-4 weeks. No change in mass size on last CT scan. Visual distortions may be normal post-radiation effects. - Consider further radiation if no change in the mass.  History of malignant neoplasm of central portion of left breast Blood work normal. Monitoring for recurrence or metastasis with planned imaging. - Schedule MRI of the brain on September 12th. - Schedule follow-up appointment on September 18th. - Discuss further scans after the follow-up appointment on September 18th.  No orders of the defined types were placed in this encounter.  The patient has a good understanding of the overall plan. she agrees with it. she will call with any problems that may develop before the next visit here. Total time spent: 30 mins including face to face time and time spent for planning, charting and co-ordination of care   Viinay K Nijel Flink, MD 03/13/24

## 2024-03-20 ENCOUNTER — Ambulatory Visit: Admitting: Hematology and Oncology

## 2024-03-20 ENCOUNTER — Other Ambulatory Visit

## 2024-03-20 ENCOUNTER — Ambulatory Visit

## 2024-03-23 ENCOUNTER — Other Ambulatory Visit: Payer: Self-pay | Admitting: Hematology and Oncology

## 2024-03-28 ENCOUNTER — Encounter (HOSPITAL_COMMUNITY): Payer: Self-pay

## 2024-03-28 ENCOUNTER — Ambulatory Visit (HOSPITAL_COMMUNITY)
Admission: RE | Admit: 2024-03-28 | Discharge: 2024-03-28 | Disposition: A | Discharge status: Home / Self Care | Source: Ambulatory Visit | Attending: Hematology and Oncology | Admitting: Hematology and Oncology

## 2024-03-28 DIAGNOSIS — C50112 Malignant neoplasm of central portion of left female breast: Secondary | ICD-10-CM | POA: Insufficient documentation

## 2024-03-28 DIAGNOSIS — Z170 Estrogen receptor positive status [ER+]: Secondary | ICD-10-CM | POA: Insufficient documentation

## 2024-03-28 MED ORDER — IOHEXOL 300 MG/ML  SOLN
100.0000 mL | Freq: Once | INTRAMUSCULAR | Status: AC | PRN
  Administered 2024-03-28: 100 mL via INTRAVENOUS

## 2024-03-28 MED ORDER — SODIUM CHLORIDE (PF) 0.9 % IJ SOLN
INTRAMUSCULAR | Status: AC
  Filled 2024-03-28: qty 50

## 2024-04-03 ENCOUNTER — Inpatient Hospital Stay

## 2024-04-03 ENCOUNTER — Other Ambulatory Visit: Payer: Self-pay | Admitting: *Deleted

## 2024-04-03 ENCOUNTER — Inpatient Hospital Stay: Admitting: Hematology and Oncology

## 2024-04-03 VITALS — BP 134/75 | HR 77 | Temp 97.8°F | Resp 18 | Ht 64.0 in | Wt 244.2 lb

## 2024-04-03 DIAGNOSIS — Z170 Estrogen receptor positive status [ER+]: Secondary | ICD-10-CM | POA: Diagnosis not present

## 2024-04-03 DIAGNOSIS — C50919 Malignant neoplasm of unspecified site of unspecified female breast: Secondary | ICD-10-CM

## 2024-04-03 DIAGNOSIS — C50112 Malignant neoplasm of central portion of left female breast: Secondary | ICD-10-CM | POA: Diagnosis not present

## 2024-04-03 DIAGNOSIS — Z5181 Encounter for therapeutic drug level monitoring: Secondary | ICD-10-CM

## 2024-04-03 LAB — CMP (CANCER CENTER ONLY)
ALT: 41 U/L (ref 0–44)
AST: 29 U/L (ref 15–41)
Albumin: 3.9 g/dL (ref 3.5–5.0)
Alkaline Phosphatase: 73 U/L (ref 38–126)
Anion gap: 4 — ABNORMAL LOW (ref 5–15)
BUN: 7 mg/dL (ref 6–20)
CO2: 31 mmol/L (ref 22–32)
Calcium: 8.7 mg/dL — ABNORMAL LOW (ref 8.9–10.3)
Chloride: 106 mmol/L (ref 98–111)
Creatinine: 0.63 mg/dL (ref 0.44–1.00)
GFR, Estimated: >60 mL/min (ref >60)
Glucose, Bld: 111 mg/dL — ABNORMAL HIGH (ref 70–99)
Potassium: 4 mmol/L (ref 3.5–5.1)
Sodium: 141 mmol/L (ref 135–145)
Total Bilirubin: 0.2 mg/dL (ref 0.0–1.2)
Total Protein: 6.3 g/dL — ABNORMAL LOW (ref 6.5–8.1)

## 2024-04-03 LAB — CBC WITH DIFFERENTIAL (CANCER CENTER ONLY)
Abs Immature Granulocytes: 0.01 K/uL (ref 0.00–0.07)
Basophils Absolute: 0 K/uL (ref 0.0–0.1)
Basophils Relative: 1 %
Eosinophils Absolute: 0.2 K/uL (ref 0.0–0.5)
Eosinophils Relative: 5 %
HCT: 36.5 % (ref 36.0–46.0)
Hemoglobin: 12.2 g/dL (ref 12.0–15.0)
Immature Granulocytes: 0 %
Lymphocytes Relative: 28 %
Lymphs Abs: 1 K/uL (ref 0.7–4.0)
MCH: 30.7 pg (ref 26.0–34.0)
MCHC: 33.4 g/dL (ref 30.0–36.0)
MCV: 91.7 fL (ref 80.0–100.0)
Monocytes Absolute: 0.4 K/uL (ref 0.1–1.0)
Monocytes Relative: 10 %
Neutro Abs: 2 K/uL (ref 1.7–7.7)
Neutrophils Relative %: 56 %
Platelet Count: 245 K/uL (ref 150–400)
RBC: 3.98 MIL/uL (ref 3.87–5.11)
RDW: 14.9 % (ref 11.5–15.5)
WBC Count: 3.6 K/uL — ABNORMAL LOW (ref 4.0–10.5)
nRBC: 0 % (ref 0.0–0.2)

## 2024-04-03 MED ORDER — PROMETHAZINE HCL 25 MG PO TABS
25.0000 mg | ORAL_TABLET | Freq: Once | ORAL | Status: AC
  Administered 2024-04-03: 25 mg via ORAL
  Filled 2024-04-03: qty 1

## 2024-04-03 MED ORDER — SODIUM CHLORIDE 0.9% FLUSH
10.0000 mL | INTRAVENOUS | Status: DC | PRN
  Administered 2024-04-03: 10 mL

## 2024-04-03 MED ORDER — FAM-TRASTUZUMAB DERUXTECAN-NXKI CHEMO 100 MG IV SOLR
340.0000 mg | Freq: Once | INTRAVENOUS | Status: AC
  Administered 2024-04-03: 340 mg via INTRAVENOUS
  Filled 2024-04-03: qty 17

## 2024-04-03 MED ORDER — DEXAMETHASONE SODIUM PHOSPHATE 10 MG/ML IJ SOLN
10.0000 mg | Freq: Once | INTRAMUSCULAR | Status: AC
  Administered 2024-04-03: 10 mg via INTRAVENOUS
  Filled 2024-04-03: qty 1

## 2024-04-03 MED ORDER — ACETAMINOPHEN 325 MG PO TABS
650.0000 mg | ORAL_TABLET | Freq: Once | ORAL | Status: AC
  Administered 2024-04-03: 650 mg via ORAL
  Filled 2024-04-03: qty 2

## 2024-04-03 MED ORDER — APREPITANT 130 MG/18ML IV EMUL
130.0000 mg | Freq: Once | INTRAVENOUS | Status: AC
  Administered 2024-04-03: 130 mg via INTRAVENOUS
  Filled 2024-04-03: qty 18

## 2024-04-03 MED ORDER — DIPHENHYDRAMINE HCL 25 MG PO CAPS
25.0000 mg | ORAL_CAPSULE | Freq: Once | ORAL | Status: AC
  Administered 2024-04-03: 25 mg via ORAL
  Filled 2024-04-03: qty 1

## 2024-04-03 MED ORDER — DEXTROSE 5 % IV SOLN: Freq: Once | INTRAVENOUS | Status: AC

## 2024-04-03 NOTE — Progress Notes (Signed)
 Patient Care Team: Patient, [name] Krystal Potts, MD as Medical Oncologist (Hematology and Oncology) [name], MD as Consulting Physician (Radiation Oncology) [name], NP as Nurse Practitioner (Hematology and Oncology) [name], MD as Consulting Physician (General Surgery) [name], RPH-CPP as Pharmacist (Hematology and Oncology)  DIAGNOSIS:  Encounter Diagnosis  Name Primary?   Malignant neoplasm of central portion of left breast in female, estrogen receptor positive (HCC) Yes    SUMMARY OF ONCOLOGIC HISTORY: Oncology History  Cancer of central portion of left female breast (HCC)  04/02/2014 Genetic Testing   BRCA1 [id number] pathogenic mutation identified on gene sequce and del/dup analyses of BRCA1 and BRCA2.  BRCA1 p.H1283R VUS identified as well.  The report date is 04/02/2014.  UPDATE: BRCA1 e.Y8716M VUS has been reclassified as a likely benign variant.  The reclassification date is 04/21/2020.   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 N0 M0 stage IA: Oncotype DX 13 (ROR 9%)   04/16/2014 Surgery   Left upper back melanoma resected no residual melanoma identified on re-resection margins negative   05/26/2014 - 06/08/2015 Anti-estrogen oral therapy   Tamoxifen  20 mg daily with a plan to switch her to aromatase inhibitors once she gets oophorectomy ( patient did not continue antiestrogen therapy by choice)   08/13/2014 Surgery   oophorectomy with total abdominal hysterectomy   09/08/2015 -  Anti-estrogen oral therapy   Resumed anastrozole  after she found recurrence of breast cancer; stopped in January 2018, started letrozole  2.5 mg daily 10/12/2016; switched to Exemestane  25 mg daily on 01/03/17   09/16/2015 Surgery   Skin, left:  IDC grade 3; 0.5 cm, margins negative, ER 95%, PR 10%, HER-2 positive ratio 2.08   11/08/2015 - 02/2017 Chemotherapy   Herceptin  every 3 weeks plus anastrozole    11/24/2015 - 01/14/2016 Radiation Therapy   Adjuvant radiation therapy by Dr. Shannon   01/2017 -  Anti-estrogen oral therapy   Exemestane  daily (stopped others due to side effects), due to muscle aches and pains switch to tamoxifen  5 mg daily starting 12/18/2017   08/07/2019 Relapse/Recurrence   Bone scan: Sclerotic metastatic lesions involving left femoral neck, right ninth rib and entire L5 vertebra; MRI left hip: Diffuse signal abnormality L5 vertebral body with possible extraosseous extension   08/22/2019 - 09/03/2019 Radiation Therapy   Palliative radiation with Dr. Shannon to L5 and left proixmal femur.    10/2021 Treatment Plan Change   Fulvestrant  and Verzenio    08/23/2022 Imaging   CT CAP 08/23/2022: New and enlarged pulmonary nodule consistent with progression.  Enlarged small low cervical and mediastinal nodes.  Multiple bone metastases minimally progressive.  Liver lesions evaluation is challenging because of hepatic steatosis with suspected liver progression, isolated tiny omental nodule     09/07/2022 PET scan   IMPRESSION: 1. Worsening of disease now with nodal, pulmonary and hepatic metastases. 2. Areas of variable FDG uptake with some new bone lesions and some areas of bony metastasis which are more focal, for instance in the pelvis where there was likely diffuse involvement on prior imaging. Many of the areas of sclerosis do not show substantial uptake as outlined above and some areas show improvement/resolution of FDG uptake seen previously but there are signs of active bony metastatic disease currently. 3. Likely LEFT adrenal adenoma. 4. Severe  hepatic steatosis. 5. Aortic atherosclerosis.   Primary malignant neoplasm of breast with metastasis (HCC)  08/25/2019 Initial Diagnosis   Primary malignant neoplasm of  breast with metastasis (HCC)   10/11/2022 -  Chemotherapy   Patient is on Treatment Plan : BREAST METASTATIC Fam-Trastuzumab Deruxtecan-nxki  (Enhertu ) (5.4) q21d       CHIEF COMPLIANT: Follow-up on Enhertu   HISTORY OF PRESENT ILLNESS:   History of Present Illness Krystal [name] is a 58 year old female who presents for follow-up on her recent imaging and eye symptoms.  Two weeks ago, she experienced a significant episode of epistaxis without an obvious cause, resulting in blood dripping on her shirt and into her throat. She has worsening vision in one eye, with distorted faces and straight lines, persistent swelling, and constant floaters. Previous radiation treatment has not improved her condition. Recent blood work shows a glucose level of 111.     ALLERGIES:  is allergic to dilaudid  [hydromorphone ] and codeine.  MEDICATIONS:  Current Outpatient Medications  Medication Sig Dispense Refill   ALPRAZolam  (XANAX ) 0.25 MG tablet Take 1 tablet (0.25 mg total) by mouth at bedtime as needed for anxiety. 30 tablet 3   aspirin  EC 81 MG tablet Take 1 tablet (81 mg total) by mouth daily. Swallow whole. 90 tablet 3   aspirin -acetaminophen -caffeine (EXCEDRIN MIGRAINE) 250-250-65 MG tablet Take 2 tablets by mouth every 6 (six) hours as needed for headache.     CALCIUM  PO Take 1,200 mg by mouth 2 (two) times daily.     dexamethasone  (DECADRON ) 4 MG tablet Take 1 tablets (4 mg) by mouth daily for 3 days after chemotherapy. Take with food. 30 tablet 1   esomeprazole (NEXIUM) 40 MG capsule Take 40 mg by mouth daily.     lidocaine -prilocaine  (EMLA ) cream Apply to affected area once 30 g 3   loperamide (IMODIUM) 2 MG capsule Take 2 mg by mouth daily as needed for diarrhea or loose stools. Take 2 tablets at first diarrhea and then 1 tablet after additional loose stool. Max number in 24 hours is 8 tablets.     loratadine (CLARITIN) 10 MG tablet Take 10 mg by mouth daily.     meloxicam  (MOBIC ) 15 MG tablet TAKE  1 TABLET(15 MG) BY MOUTH DAILY AS NEEDED FOR PAIN 30 tablet 1   ondansetron  (ZOFRAN ) 8 MG tablet Take 1 tablet (8 mg total) by mouth every 8 (eight) hours as needed for nausea or vomiting. Start on the third day after chemotherapy. 30 tablet 1   prochlorperazine  (COMPAZINE ) 10 MG tablet Take 1 tablet (10 mg total) by mouth every 6 (six) hours as needed for nausea or vomiting. 30 tablet 1   rosuvastatin  (CRESTOR ) 10 MG tablet Take 1 tablet (10 mg total) by mouth daily. 90 tablet 3   traZODone  (DESYREL ) 50 MG tablet TAKE 1 TABLET(50 MG) BY MOUTH AT BEDTIME 90 tablet 3   venlafaxine  XR (EFFEXOR -XR) 75 MG 24 hr capsule TAKE 1 CAPSULE(75 MG) BY MOUTH AT BEDTIME 90 capsule 3   No current facility-administered medications for this visit.   Facility-Administered Medications Ordered in Other Visits  Medication Dose Route Frequency Provider Last Rate Last Admin   fam-trastuzumab deruxtecan-nxki  (ENHERTU ) 340 mg in dextrose  5 % 100 mL chemo infusion  340 mg Intravenous Once Mahki Spikes, MD 234 mL/hr at 04/03/24 1632 340 mg at 04/03/24 1632   sodium chloride  flush (NS) 0.9 % injection 10 mL  10 mL Intracatheter PRN Krystal Potts, MD  PHYSICAL EXAMINATION: ECOG PERFORMANCE STATUS: 1 - Symptomatic but completely ambulatory  Vitals:   04/03/24 1500  BP: 134/75  Pulse: 77  Resp: 18  Temp: 97.8 F (36.6 C)  SpO2: 97%   Filed Weights   04/03/24 1500  Weight: 244 lb 3.2 oz (110.8 kg)    Physical Exam   (exam performed in the presence of a chaperone)  LABORATORY DATA:  I have reviewed the data as listed    Latest Ref Rng & Units 04/03/2024    2:10 PM 03/13/2024    2:42 PM 02/21/2024    2:21 PM  CMP  Glucose 70 - 99 mg/dL 888  880  98   BUN 6 - 20 mg/dL 7  9  9    Creatinine 0.44 - 1.00 mg/dL 9.36  9.37  9.47   Sodium 135 - 145 mmol/L 141  141  141   Potassium 3.5 - 5.1 mmol/L 4.0  4.0  4.0   Chloride 98 - 111 mmol/L 106  104  110   CO2 22 - 32 mmol/L 31  30  27    Calcium  8.9 - 10.3  mg/dL 8.7  9.2  7.9   Total Protein 6.5 - 8.1 g/dL 6.3  6.8  6.1   Total Bilirubin 0.0 - 1.2 mg/dL 0.2  0.3  0.2   Alkaline Phos 38 - 126 U/L 73  72  64   AST 15 - 41 U/L 29  25  22    ALT 0 - 44 U/L 41  41  28     Lab Results  Component Value Date   WBC 3.6 (L) 04/03/2024   HGB 12.2 04/03/2024   HCT 36.5 04/03/2024   MCV 91.7 04/03/2024   PLT 245 04/03/2024   NEUTROABS 2.0 04/03/2024    ASSESSMENT & PLAN:  Cancer of central portion of left female breast (HCC) 08/22/2019: PET CT scan: Metastatic disease in the chest with multiple lung nodules all subcentimeter size and hypermetabolic lymph nodes (right paratracheal node 7 mm, subcarinal node 1 cm, right hilar node 6 mm), retroperitoneal and upper pelvic common iliac lymph nodes, bone metastases especially L5, L4, T6, right 10th rib, left proximal femur   Bone biopsy was not felt to be reliable in addition to the effect of radiation on the bone. Palliative radiation completed 09/03/2019 [facility]: BRCA1 mutation, TMB 5.36, MSI: Normal   Prior treatment: Ibrance  with letrozole  and Xgeva for bone metastases switched to abemaciclib  and Fulvestrant    Bone metastasis: Patient does not want to take bisphosphonates CT CAP 08/23/2022: New and enlarged pulmonary nodule consistent with progression.  Enlarged small low cervical and mediastinal nodes.  Multiple bone metastases minimally progressive.  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  Skull metastases, abnormal enhancement left globe  ---------------------------------------------------------------- Current treatment:: Enhertu  cycle  24 Enhertu  toxicities: Fatigue Monitoring closely for toxicities    CT CAP 10/11/2023: Continued decrease in the pulmonary metastases, no significant change in the liver lesions, unchanged bone metastases CT CAP 03/28/2024: Stable bone metastases, no suspicious lung nodules.   Eye metastases: Status post radiation.  Ophthalmologist apparently reviewed her eye and felt it did not shrink.  She continues to have vision problems in  the left eye. Dr. Buckley recommended continued MRI surveillance ------------------------------------- Assessment and Plan Assessment & Plan Malignant neoplasm of central portion of left breast, ER+ No visceral metastasis. Stable bone lesions. No lymph node involvement. Normal blood work. - Proceed with scheduled treatment today.  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 Vision worsened with distortion and floaters. Retinal specialist recommended injections for swelling. Further radiation may not be beneficial if stable. MRI scheduled for further evaluation. - Proceed with MRI on September 12th. - Discuss findings at tumor board meeting on September 15th. - Follow up with Doctor Patrcia on September 16th for further advice.  Stable left adrenal nodule Left adrenal nodule reduced from 15 mm to 14 mm. Previously identified as benign.  Goals of Care Concerned about subretinal mass impact on vision and daily activities. Apprehensive about further radiation but understands mass's aggressive nature and responsiveness to radiation. Prefers to avoid unnecessary radiation if stable.      No orders of the defined types were placed in this encounter.  The patient has a good understanding of the overall plan. she agrees with it. she will call with any problems that may develop before the next visit here. Total time spent: 30 mins including face to face time and time spent for planning, charting and co-ordination of  care   Krystal MARLA Chad, MD 04/03/24

## 2024-04-03 NOTE — Patient Instructions (Signed)

## 2024-04-03 NOTE — Assessment & Plan Note (Signed)
 08/22/2019: PET CT scan: Metastatic disease in the chest with multiple lung nodules all subcentimeter size and hypermetabolic lymph nodes (right paratracheal node 7 mm, subcarinal node 1 cm, right hilar node 6 mm), retroperitoneal and upper pelvic common iliac lymph nodes, bone metastases especially L5, L4, T6, right 10th rib, left proximal femur   Bone biopsy was not felt to be reliable in addition to the effect of radiation on the bone. Palliative radiation completed 09/03/2019 Guardant 360: BRCA1 mutation, TMB 5.36, MSI: Normal   Prior treatment: Ibrance  with letrozole  and Xgeva for bone metastases switched to abemaciclib  and Fulvestrant    Bone metastasis: Patient does not want to take bisphosphonates CT CAP 08/23/2022: New and enlarged pulmonary nodule consistent with progression.  Enlarged small low cervical and mediastinal nodes.  Multiple bone metastases minimally progressive.  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  Skull metastases, abnormal enhancement left globe  ---------------------------------------------------------------- Current treatment:: Enhertu  cycle 24 Enhertu  toxicities: Fatigue Monitoring closely for toxicities    CT CAP 10/11/2023: Continued decrease in the pulmonary metastases, no significant change in the liver lesions, unchanged bone metastases CT CAP 03/28/2024:   Dr. Buckley recommended continued MRI surveillance

## 2024-04-08 ENCOUNTER — Ambulatory Visit
Admission: RE | Admit: 2024-04-08 | Discharge: 2024-04-08 | Disposition: A | Discharge status: Home / Self Care | Source: Ambulatory Visit | Attending: Hematology and Oncology | Admitting: Hematology and Oncology

## 2024-04-08 DIAGNOSIS — C6932 Malignant neoplasm of left choroid: Secondary | ICD-10-CM

## 2024-04-08 NOTE — Progress Notes (Signed)
  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    The patient was available for call today.   Symptoms of fatigue have improved since completing therapy.  Reports no symptoms of skin changes since completing therapy.  Reports no symptoms of difficulty swallowing since completing therapy.  Reports she has worsening vision in left eye, with distorted faces and straight lines, persistent swelling, and constant floaters. Previous radiation treatment has not improved her condition.  The patient has scheduled follow up with her medical oncologist Dr. Vinay Gudena for ongoing surveillance, and with Dr. Donnice Barge. The patient was encouraged to call if she develops concerns or questions regarding radiation.

## 2024-04-09 ENCOUNTER — Encounter: Payer: Self-pay | Admitting: Hematology and Oncology

## 2024-04-10 ENCOUNTER — Other Ambulatory Visit: Payer: Self-pay

## 2024-04-11 ENCOUNTER — Other Ambulatory Visit (HOSPITAL_BASED_OUTPATIENT_CLINIC_OR_DEPARTMENT_OTHER): Payer: Self-pay

## 2024-04-16 ENCOUNTER — Ambulatory Visit (HOSPITAL_COMMUNITY)

## 2024-04-18 ENCOUNTER — Other Ambulatory Visit

## 2024-04-18 ENCOUNTER — Telehealth: Payer: Self-pay | Admitting: Internal Medicine

## 2024-04-18 NOTE — Telephone Encounter (Signed)
 Scheduled appointment per staff message. Talked with the patient and she is aware of the made appointment.

## 2024-04-21 ENCOUNTER — Inpatient Hospital Stay

## 2024-04-22 ENCOUNTER — Inpatient Hospital Stay: Admitting: Internal Medicine

## 2024-04-24 ENCOUNTER — Inpatient Hospital Stay: Attending: Hematology and Oncology

## 2024-04-24 ENCOUNTER — Inpatient Hospital Stay (HOSPITAL_BASED_OUTPATIENT_CLINIC_OR_DEPARTMENT_OTHER): Admitting: Hematology and Oncology

## 2024-04-24 ENCOUNTER — Inpatient Hospital Stay

## 2024-04-24 VITALS — BP 142/84 | HR 96 | Temp 98.4°F | Resp 18

## 2024-04-24 VITALS — BP 142/77 | HR 88 | Temp 97.9°F | Resp 18 | Ht 64.0 in | Wt 244.2 lb

## 2024-04-24 DIAGNOSIS — Z7952 Long term (current) use of systemic steroids: Secondary | ICD-10-CM | POA: Insufficient documentation

## 2024-04-24 DIAGNOSIS — Z923 Personal history of irradiation: Secondary | ICD-10-CM | POA: Insufficient documentation

## 2024-04-24 DIAGNOSIS — R200 Anesthesia of skin: Secondary | ICD-10-CM | POA: Diagnosis not present

## 2024-04-24 DIAGNOSIS — C50919 Malignant neoplasm of unspecified site of unspecified female breast: Secondary | ICD-10-CM

## 2024-04-24 DIAGNOSIS — Z5112 Encounter for antineoplastic immunotherapy: Secondary | ICD-10-CM | POA: Diagnosis not present

## 2024-04-24 DIAGNOSIS — Z170 Estrogen receptor positive status [ER+]: Secondary | ICD-10-CM

## 2024-04-24 DIAGNOSIS — C7931 Secondary malignant neoplasm of brain: Secondary | ICD-10-CM | POA: Diagnosis not present

## 2024-04-24 DIAGNOSIS — Z85828 Personal history of other malignant neoplasm of skin: Secondary | ICD-10-CM | POA: Insufficient documentation

## 2024-04-24 DIAGNOSIS — I700 Atherosclerosis of aorta: Secondary | ICD-10-CM | POA: Insufficient documentation

## 2024-04-24 DIAGNOSIS — Z7982 Long term (current) use of aspirin: Secondary | ICD-10-CM | POA: Diagnosis not present

## 2024-04-24 DIAGNOSIS — Z1501 Genetic susceptibility to malignant neoplasm of breast: Secondary | ICD-10-CM | POA: Insufficient documentation

## 2024-04-24 DIAGNOSIS — C50112 Malignant neoplasm of central portion of left female breast: Secondary | ICD-10-CM

## 2024-04-24 DIAGNOSIS — Z79810 Long term (current) use of selective estrogen receptor modulators (SERMs): Secondary | ICD-10-CM | POA: Insufficient documentation

## 2024-04-24 DIAGNOSIS — C7951 Secondary malignant neoplasm of bone: Secondary | ICD-10-CM | POA: Insufficient documentation

## 2024-04-24 DIAGNOSIS — C7801 Secondary malignant neoplasm of right lung: Secondary | ICD-10-CM | POA: Diagnosis not present

## 2024-04-24 DIAGNOSIS — Z9221 Personal history of antineoplastic chemotherapy: Secondary | ICD-10-CM | POA: Insufficient documentation

## 2024-04-24 DIAGNOSIS — K760 Fatty (change of) liver, not elsewhere classified: Secondary | ICD-10-CM | POA: Diagnosis not present

## 2024-04-24 DIAGNOSIS — Z79899 Other long term (current) drug therapy: Secondary | ICD-10-CM | POA: Insufficient documentation

## 2024-04-24 DIAGNOSIS — Z9013 Acquired absence of bilateral breasts and nipples: Secondary | ICD-10-CM | POA: Insufficient documentation

## 2024-04-24 LAB — CMP (CANCER CENTER ONLY)
ALT: 51 U/L — ABNORMAL HIGH (ref 0–44)
AST: 34 U/L (ref 15–41)
Albumin: 4.1 g/dL (ref 3.5–5.0)
Alkaline Phosphatase: 80 U/L (ref 38–126)
Anion gap: 6 (ref 5–15)
BUN: 10 mg/dL (ref 6–20)
CO2: 31 mmol/L (ref 22–32)
Calcium: 8.8 mg/dL — ABNORMAL LOW (ref 8.9–10.3)
Chloride: 104 mmol/L (ref 98–111)
Creatinine: 0.65 mg/dL (ref 0.44–1.00)
GFR, Estimated: >60 mL/min (ref >60)
Glucose, Bld: 120 mg/dL — ABNORMAL HIGH (ref 70–99)
Potassium: 4 mmol/L (ref 3.5–5.1)
Sodium: 141 mmol/L (ref 135–145)
Total Bilirubin: 0.3 mg/dL (ref 0.0–1.2)
Total Protein: 7 g/dL (ref 6.5–8.1)

## 2024-04-24 LAB — CBC WITH DIFFERENTIAL (CANCER CENTER ONLY)
Abs Immature Granulocytes: 0.01 K/uL (ref 0.00–0.07)
Basophils Absolute: 0 K/uL (ref 0.0–0.1)
Basophils Relative: 1 %
Eosinophils Absolute: 0.3 K/uL (ref 0.0–0.5)
Eosinophils Relative: 7 %
HCT: 36.4 % (ref 36.0–46.0)
Hemoglobin: 12.4 g/dL (ref 12.0–15.0)
Immature Granulocytes: 0 %
Lymphocytes Relative: 31 %
Lymphs Abs: 1.2 K/uL (ref 0.7–4.0)
MCH: 31.2 pg (ref 26.0–34.0)
MCHC: 34.1 g/dL (ref 30.0–36.0)
MCV: 91.7 fL (ref 80.0–100.0)
Monocytes Absolute: 0.4 K/uL (ref 0.1–1.0)
Monocytes Relative: 12 %
Neutro Abs: 1.8 K/uL (ref 1.7–7.7)
Neutrophils Relative %: 49 %
Platelet Count: 287 K/uL (ref 150–400)
RBC: 3.97 MIL/uL (ref 3.87–5.11)
RDW: 14.8 % (ref 11.5–15.5)
WBC Count: 3.7 K/uL — ABNORMAL LOW (ref 4.0–10.5)
nRBC: 0 % (ref 0.0–0.2)

## 2024-04-24 MED ORDER — DEXTROSE 5 % IV SOLN: Freq: Once | INTRAVENOUS | Status: AC

## 2024-04-24 MED ORDER — ACETAMINOPHEN 325 MG PO TABS
650.0000 mg | ORAL_TABLET | Freq: Once | ORAL | Status: AC
  Administered 2024-04-24: 650 mg via ORAL
  Filled 2024-04-24: qty 2

## 2024-04-24 MED ORDER — DEXAMETHASONE SODIUM PHOSPHATE 10 MG/ML IJ SOLN
10.0000 mg | Freq: Once | INTRAMUSCULAR | Status: AC
  Administered 2024-04-24: 10 mg via INTRAVENOUS
  Filled 2024-04-24: qty 1

## 2024-04-24 MED ORDER — FAM-TRASTUZUMAB DERUXTECAN-NXKI CHEMO 100 MG IV SOLR
340.0000 mg | Freq: Once | INTRAVENOUS | Status: AC
  Administered 2024-04-24: 340 mg via INTRAVENOUS
  Filled 2024-04-24: qty 17

## 2024-04-24 MED ORDER — DIPHENHYDRAMINE HCL 25 MG PO CAPS
25.0000 mg | ORAL_CAPSULE | Freq: Once | ORAL | Status: AC
  Administered 2024-04-24: 25 mg via ORAL
  Filled 2024-04-24: qty 1

## 2024-04-24 MED ORDER — PROMETHAZINE HCL 25 MG PO TABS
25.0000 mg | ORAL_TABLET | Freq: Once | ORAL | Status: AC
  Administered 2024-04-24: 25 mg via ORAL
  Filled 2024-04-24: qty 1

## 2024-04-24 MED ORDER — APREPITANT 130 MG/18ML IV EMUL
130.0000 mg | Freq: Once | INTRAVENOUS | Status: AC
  Administered 2024-04-24: 130 mg via INTRAVENOUS
  Filled 2024-04-24: qty 18

## 2024-04-24 NOTE — Patient Instructions (Signed)

## 2024-04-24 NOTE — Assessment & Plan Note (Signed)
 08/22/2019: PET CT scan: Metastatic disease in the chest with multiple lung nodules all subcentimeter size and hypermetabolic lymph nodes (right paratracheal node 7 mm, subcarinal node 1 cm, right hilar node 6 mm), retroperitoneal and upper pelvic common iliac lymph nodes, bone metastases especially L5, L4, T6, right 10th rib, left proximal femur   Bone biopsy was not felt to be reliable in addition to the effect of radiation on the bone. Palliative radiation completed 09/03/2019 Guardant 360: BRCA1 mutation, TMB 5.36, MSI: Normal   Prior treatment: Ibrance  with letrozole  and Xgeva for bone metastases switched to abemaciclib  and Fulvestrant    Bone metastasis: Patient does not want to take bisphosphonates CT CAP 08/23/2022: New and enlarged pulmonary nodule consistent with progression.  Enlarged small low cervical and mediastinal nodes.  Multiple bone metastases minimally progressive.  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  Skull metastases, abnormal enhancement left globe  ---------------------------------------------------------------- Current treatment:: Enhertu  cycle 25 Enhertu  toxicities: Fatigue Monitoring closely for toxicities    CT CAP 10/11/2023: Continued decrease in the pulmonary metastases, no significant change in the liver lesions, unchanged bone metastases CT CAP 03/28/2024: Stable bone metastases, no suspicious lung nodules.   Eye metastases: Status post radiation.  Ophthalmologist apparently reviewed her eye and felt it did not shrink.  She continues to have vision problems in the left eye. Dr. Buckley recommended continued  MRI surveillance

## 2024-04-24 NOTE — Progress Notes (Signed)
 Patient Care Team: Patient, [name] [name], MD as Medical Oncologist (Hematology and Oncology) [name], MD as Consulting Physician (Radiation Oncology) [name], NP as Nurse Practitioner (Hematology and Oncology) [name], MD as Consulting Physician (General Surgery) [name], RPH-CPP as Pharmacist (Hematology and Oncology)  DIAGNOSIS:  Encounter Diagnosis  Name Primary?   Malignant neoplasm of central portion of left breast in female, estrogen receptor positive (HCC) Yes    SUMMARY OF ONCOLOGIC HISTORY: Oncology History  Cancer of central portion of left female breast (HCC)  04/02/2014 Genetic Testing   BRCA1 [id number] pathogenic mutation identified on gene sequce and del/dup analyses of BRCA1 and BRCA2.  BRCA1 [id number] VUS identified as well.  The report date is 04/02/2014.  UPDATE: BRCA1 [id number] VUS has been reclassified as a likely benign variant.  The reclassification date is 04/21/2020.   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 N0 M0 stage IA: Oncotype DX 13 (ROR 9%)   04/16/2014 Surgery   Left upper back melanoma resected no residual melanoma identified on re-resection margins negative   05/26/2014 - 06/08/2015 Anti-estrogen oral therapy   Tamoxifen  20 mg daily with a plan to switch her to aromatase inhibitors once she gets oophorectomy ( patient did not continue antiestrogen therapy by choice)   08/13/2014 Surgery   oophorectomy with total abdominal hysterectomy   09/08/2015 -  Anti-estrogen oral therapy   Resumed anastrozole  after she found recurrence of breast cancer; stopped in January 2018, started letrozole  2.5 mg daily 10/12/2016; switched to Exemestane  25 mg daily on 01/03/17   09/16/2015 Surgery   Skin, left:  IDC grade 3; 0.5 cm, margins negative, ER 95%, PR 10%, HER-2 positive ratio 2.08   11/08/2015 - 02/2017 Chemotherapy   Herceptin  every 3 weeks plus anastrozole    11/24/2015 - 01/14/2016 Radiation Therapy   Adjuvant radiation therapy by Dr. Shannon   01/2017 -  Anti-estrogen oral therapy   Exemestane  daily (stopped others due to side effects), due to muscle aches and pains switch to tamoxifen  5 mg daily starting 12/18/2017   08/07/2019 Relapse/Recurrence   Bone scan: Sclerotic metastatic lesions involving left femoral neck, right ninth rib and entire L5 vertebra; MRI left hip: Diffuse signal abnormality L5 vertebral body with possible extraosseous extension   08/22/2019 - 09/03/2019 Radiation Therapy   Palliative radiation with Dr. Shannon to L5 and left proixmal femur.    10/2021 Treatment Plan Change   Fulvestrant  and Verzenio    08/23/2022 Imaging   CT CAP 08/23/2022: New and enlarged pulmonary nodule consistent with progression.  Enlarged small low cervical and mediastinal nodes.  Multiple bone metastases minimally progressive.  Liver lesions evaluation is challenging because of hepatic steatosis with suspected liver progression, isolated tiny omental nodule     09/07/2022 PET scan   IMPRESSION: 1. Worsening of disease now with nodal, pulmonary and hepatic metastases. 2. Areas of variable FDG uptake with some new bone lesions and some areas of bony metastasis which are more focal, for instance in the pelvis where there was likely diffuse involvement on prior imaging. Many of the areas of sclerosis do not show substantial uptake as outlined above and some areas show improvement/resolution of FDG uptake seen previously but there are signs of active bony metastatic disease currently. 3. Likely LEFT adrenal adenoma. 4. Severe  hepatic steatosis. 5. Aortic atherosclerosis.   Primary malignant neoplasm of breast with metastasis (HCC)  08/25/2019 Initial Diagnosis   Primary malignant neoplasm of  breast with metastasis (HCC)   10/11/2022 -  Chemotherapy   Patient is on Treatment Plan : BREAST METASTATIC Fam-Trastuzumab Deruxtecan-nxki  (Enhertu ) (5.4) q21d       CHIEF COMPLIANT: Follow-up on Enhertu   HISTORY OF PRESENT ILLNESS:   History of Present Illness Krystal [name] is a 58 year old female who presents with worsening vision post-radiation therapy.  She experiences worsening vision with 'squiggly stuff' in her eye and occasional swelling. Her vision has not improved since her radiation therapy over two to four weeks ago.  She had a consultation with Doctor Jarold, who noted no change in her condition. She is awaiting further imaging studies, with an MRI rescheduled for September 23rd and a follow-up appointment on September 25th.     ALLERGIES:  is allergic to dilaudid  [hydromorphone ] and codeine.  MEDICATIONS:  Current Outpatient Medications  Medication Sig Dispense Refill   ALPRAZolam  (XANAX ) 0.25 MG tablet Take 1 tablet (0.25 mg total) by mouth at bedtime as needed for anxiety. 30 tablet 3   aspirin  EC 81 MG tablet Take 1 tablet (81 mg total) by mouth daily. Swallow whole. 90 tablet 3   aspirin -acetaminophen -caffeine (EXCEDRIN MIGRAINE) 250-250-65 MG tablet Take 2 tablets by mouth every 6 (six) hours as needed for headache.     CALCIUM  PO Take 1,200 mg by mouth 2 (two) times daily.     dexamethasone  (DECADRON ) 4 MG tablet Take 1 tablets (4 mg) by mouth daily for 3 days after chemotherapy. Take with food. 30 tablet 1   esomeprazole (NEXIUM) 40 MG capsule Take 40 mg by mouth daily.     lidocaine -prilocaine  (EMLA ) cream Apply to affected area once 30 g 3   loperamide (IMODIUM) 2 MG capsule Take 2 mg by mouth daily as needed for diarrhea or loose stools. Take 2 tablets at first diarrhea and then 1 tablet after additional loose stool. Max number in 24 hours is 8 tablets.     loratadine (CLARITIN) 10 MG tablet Take 10 mg by mouth daily.     meloxicam  (MOBIC ) 15 MG tablet TAKE 1  TABLET(15 MG) BY MOUTH DAILY AS NEEDED FOR PAIN 30 tablet 1   ondansetron  (ZOFRAN ) 8 MG tablet Take 1 tablet (8 mg total) by mouth every 8 (eight) hours as needed for nausea or vomiting. Start on the third day after chemotherapy. 30 tablet 1   prochlorperazine  (COMPAZINE ) 10 MG tablet Take 1 tablet (10 mg total) by mouth every 6 (six) hours as needed for nausea or vomiting. 30 tablet 1   rosuvastatin  (CRESTOR ) 10 MG tablet Take 1 tablet (10 mg total) by mouth daily. 90 tablet 3   traZODone  (DESYREL ) 50 MG tablet TAKE 1 TABLET(50 MG) BY MOUTH AT BEDTIME 90 tablet 3   venlafaxine  XR (EFFEXOR -XR) 75 MG 24 hr capsule TAKE 1 CAPSULE(75 MG) BY MOUTH AT BEDTIME 90 capsule 3   No current facility-administered medications for this visit.   Facility-Administered Medications Ordered in Other Visits  Medication Dose Route Frequency Provider Last Rate Last Admin   fam-trastuzumab deruxtecan-nxki  (ENHERTU ) 340 mg in dextrose  5 % 100 mL chemo infusion  340 mg Intravenous Once Sanjay Broadfoot, MD        PHYSICAL EXAMINATION: ECOG PERFORMANCE STATUS: 1 - Symptomatic but completely ambulatory  Vitals:   04/24/24 1437  BP: (!) 142/77  Pulse: 88  Resp: 18  Temp: 97.9 F (36.6 C)  SpO2: 99%   Filed Weights   04/24/24 1437  Weight: 244 lb 3.2 oz (110.8 kg)      LABORATORY DATA:  I have reviewed the data as listed    Latest Ref Rng & Units 04/24/2024    2:01 PM 04/03/2024    2:10 PM 03/13/2024    2:42 PM  CMP  Glucose 70 - 99 mg/dL 879  888  880   BUN 6 - 20 mg/dL 10  7  9    Creatinine 0.44 - 1.00 mg/dL 9.34  9.36  9.37   Sodium 135 - 145 mmol/L 141  141  141   Potassium 3.5 - 5.1 mmol/L 4.0  4.0  4.0   Chloride 98 - 111 mmol/L 104  106  104   CO2 22 - 32 mmol/L 31  31  30    Calcium  8.9 - 10.3 mg/dL 8.8  8.7  9.2   Total Protein 6.5 - 8.1 g/dL 7.0  6.3  6.8   Total Bilirubin 0.0 - 1.2 mg/dL 0.3  0.2  0.3   Alkaline Phos 38 - 126 U/L 80  73  72   AST 15 - 41 U/L 34  29  25   ALT 0 - 44 U/L 51   41  41     Lab Results  Component Value Date   WBC 3.7 (L) 04/24/2024   HGB 12.4 04/24/2024   HCT 36.4 04/24/2024   MCV 91.7 04/24/2024   PLT 287 04/24/2024   NEUTROABS 1.8 04/24/2024    ASSESSMENT & PLAN:  Cancer of central portion of left female breast (HCC) 08/22/2019: PET CT scan: Metastatic disease in the chest with multiple lung nodules all subcentimeter size and hypermetabolic lymph nodes (right paratracheal node 7 mm, subcarinal node 1 cm, right hilar node 6 mm), retroperitoneal and upper pelvic common iliac lymph nodes, bone metastases especially L5, L4, T6, right 10th rib, left proximal femur   Bone biopsy was not felt to be reliable in addition to the effect of radiation on the bone. Palliative radiation completed 09/03/2019 Guardant 360: BRCA1 mutation, TMB 5.36, MSI: Normal   Prior treatment: Ibrance  with letrozole  and Xgeva for bone metastases switched to abemaciclib  and Fulvestrant    Bone metastasis: Patient does not want to take bisphosphonates CT CAP 08/23/2022: New and enlarged pulmonary nodule consistent with progression.  Enlarged small low cervical and mediastinal nodes.  Multiple bone metastases minimally progressive.  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  Skull metastases, abnormal enhancement left globe  ---------------------------------------------------------------- Current treatment:: Enhertu  cycle 25 Enhertu  toxicities: Fatigue Monitoring closely for toxicities    CT CAP 10/11/2023: Continued decrease in the pulmonary metastases, no significant change in the liver lesions, unchanged bone metastases CT  CAP 03/28/2024: Stable bone metastases, no suspicious lung nodules.   Eye metastases: Status post radiation.  Ophthalmologist apparently reviewed her eye and felt it did not shrink.  She continues to have vision problems in the left eye. Dr. Buckley recommended continued MRI surveillance  Patient has an MRI coming up.  I will discuss with Dr. Patrcia if any additional treatment options are possible for the eye metastases.  We will see  if she could be presented and then brain tumor board.  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 Worsening vision and swelling noted. Further radiation declined due to potential vision loss. Surgery unlikely due to anatomical challenges. - Discuss case in brain tumor board meeting after MRI. - Order MRI brain with and without contrast. - Consult neurosurgeon for further evaluation.  Malignant neoplasm of central portion of left breast      No orders of the defined types were placed in this encounter.  The patient has a good understanding of the overall plan. she agrees with it. she will call with any problems that may develop before the next visit here. Total time spent: 30 mins including face to face time and time spent for planning, charting and co-ordination of care   Naomi MARLA Chad, MD 04/24/24

## 2024-04-29 ENCOUNTER — Other Ambulatory Visit: Payer: Self-pay | Admitting: Pharmacist

## 2024-04-29 ENCOUNTER — Ambulatory Visit
Admission: RE | Admit: 2024-04-29 | Discharge: 2024-04-29 | Disposition: A | Discharge status: Home / Self Care | Source: Ambulatory Visit | Attending: Internal Medicine | Admitting: Internal Medicine

## 2024-04-29 DIAGNOSIS — C50919 Malignant neoplasm of unspecified site of unspecified female breast: Secondary | ICD-10-CM

## 2024-04-29 MED ORDER — GADOPICLENOL 0.5 MMOL/ML IV SOLN
10.0000 mL | Freq: Once | INTRAVENOUS | Status: AC | PRN
  Administered 2024-04-29: 10 mL via INTRAVENOUS

## 2024-05-01 ENCOUNTER — Inpatient Hospital Stay: Admitting: Internal Medicine

## 2024-05-01 DIAGNOSIS — C7931 Secondary malignant neoplasm of brain: Secondary | ICD-10-CM | POA: Diagnosis not present

## 2024-05-01 DIAGNOSIS — C50919 Malignant neoplasm of unspecified site of unspecified female breast: Secondary | ICD-10-CM

## 2024-05-01 NOTE — Progress Notes (Signed)
 I connected with Krystal [name] on 05/01/24 at 10:30 AM EDT by telephone visit and verified that I am speaking with the correct person using two identifiers.  I discussed the limitations, risks, security and privacy concerns of performing an evaluation and management service by telemedicine and the availability of in-person appointments. I also discussed with the patient that there may be a patient responsible charge related to this service. The patient expressed understanding and agreed to proceed.   Other persons participating in the visit and their role in the encounter:  n/a   Patient's location:  Home Provider's location:  Office Chief Complaint:  No diagnosis found.  History of Present Ilness: Krystal [name] reports no clinical changes today.  She completed radiation to the eye without issue last month with Dr. Patrcia.  Continues to have some flashing visual symptoms in her left eye.  No change with facial numbness.  Otherwise independent with ADL's.  Continues on Enhertu  with Dr. Gudena.  Observations: Language and cognition at baseline  Imaging:  [facility] Clinician Interpretation: I have personally reviewed the CNS images as listed.  My interpretation, in the context of the patient's clinical presentation, is treatment effect vs true progression  MR BRAIN W WO CONTRAST Addendum Date: 04/29/2024 ADDENDUM REPORT: 04/29/2024 14:58 ADDENDUM: In retrospect, abnormal enhancement was present at the posterior aspect of the left globe on the prior brain MRI of 10/03/2022. 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 These results were called by telephone at the time of interpretation on 04/29/2024 at 2:56 pm to provider [facility] Romaine Maciolek , who verbally acknowledged these results. Electronically Signed   By: Rockey Childs D.O.   On: 04/29/2024  14:58   Result Date: 04/29/2024 CLINICAL DATA:  Provided history: Breast cancer metastasized brain, unspecified laterality. Brain/CNS neoplasm, assess treatment response. EXAM: MRI HEAD WITHOUT AND WITH CONTRAST TECHNIQUE: Multiplanar, multiecho pulse sequences of the brain and surrounding structures were obtained without and with intravenous contrast. CONTRAST:  10 mL Vueway  intravenous contrast. COMPARISON:  Prior brain MRI examinations 01/17/2024 and earlier. FINDINGS: Brain: Cerebral volume is normal. Unchanged leptomeningeal enhancement along the right central sulcus (for instance as seen on series 13, image 136). This is consistent with leptomeningeal metastatic disease. Unchanged pachymeningeal enhancement overlying the high right frontal lobe (for instance as seen on series 15, image 33). This is consistent with pachymeningeal metastatic disease. The punctate focus of enhancement demonstrated within the anterior left temporal lobe on the prior MRI is not present on today's exam. Unchanged 2 mm precontrast T1 hyperintense lesion within the left periatrial white matter (series 11, image 84) (series 13, image 85). Precontrast T1 hyperintensity limits evaluation for enhancement at this site. Unchanged 3 mm precontrast T1 hyperintense lesion within the right cerebellar hemisphere (series 11, image 30) (series 13, image 30). Precontrast T1 hyperintensity limits evaluation for enhancement at this site. Multifocal T2 FLAIR hyperintense signal abnormality within the cerebral white matter, nonspecific but compatible with mild chronic small vessel ischemic disease. Numerous tiny foci of chronic hemosiderin deposition again demonstrated within the supratentorial brain. Partially empty sella turcica. There is no acute infarct. No extra-axial fluid collection. No midline shift. Vascular: Maintained flow voids within the proximal large arterial vessels. Skull and upper cervical spine: Known sclerotic calvarial metastases  have not significantly changed. Unchanged 6 mm T2 hyperintense and enhancing lesion within the left parietal calvarium, indeterminate but potentially  reflecting an additional metastasis (series 15, image 20). Sinuses/Orbits: Precontrast T1 hyperintense and T2 hypointense signal abnormality at the posterior aspect of the left globe, new from the prior MRI and measuring up to 3-4 mm in thickness (for instance as seen on series 11, image 71) (series 5, image 11) (series 14, image 75). Pre-contrast T1 hyperintensity limits evaluation for post-contrast enhancement at this site. Other: Small-volume fluid within left mastoid air cells. Attempts are being made to reach the ordering provider at this time. IMPRESSION: 1. Signal abnormality at the posterior aspect of the left globe, new from the prior MRI of 01/17/2024 and measuring up to 3-4 mm in thickness. Pre-contrast T1 hyperintensity limits evaluation for post-contrast enhancement at this site, and this could reflect a new intra-ocular metastasis or retinal/vitreous hemorrhage. 2. A punctate focus of enhancement previously demonstrated within the anterior left temporal lobe is not present on today's study. 3. 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 4. Unchanged calvarial metastases. 5. Small left mastoid effusion. Electronically Signed: By: Rockey Childs D.O. On: 04/29/2024 14:21   Assessment and Plan: Breast cancer metastasized to brain, unspecified laterality (HCC)  Clinically stable today from visual and neurologic standpoint.  Lesion within left eye choroid is re-demonstrated today, now 1 month removed from radiation there.  This finding will be reviewed carefully in CNS tumor board next week.  MRI brain otherwise demonstrates stable foci of enhancement within right cerebellar hemisphere.  TWe will recommend continued surveillance only.  No other findings concerning for disease progression in CNS, Enhertu   has been effective overall.  Follow Up Instructions: We ask that Krystal [name] return to clinic in 3 months following next brain MRI, or sooner as needed.  I discussed the assessment and treatment plan with the patient.  The patient was provided an opportunity to ask questions and all were answered.  The patient agreed with the plan and demonstrated understanding of the instructions.    The patient was advised to call back or seek an in-person evaluation if the symptoms worsen or if the condition fails to improve as anticipated.    Krystal Kuhnle K Azariah Latendresse, MD   I provided 20 minutes of non face-to-face telephone visit time during this encounter, and > 50% was spent counseling as documented under my assessment & plan.

## 2024-05-02 ENCOUNTER — Telehealth: Payer: Self-pay | Admitting: Internal Medicine

## 2024-05-02 NOTE — Telephone Encounter (Signed)
 Scheduled patient appointment. Called and left voicemail with appointment day and time.

## 2024-05-04 ENCOUNTER — Other Ambulatory Visit: Payer: Self-pay

## 2024-05-05 ENCOUNTER — Inpatient Hospital Stay

## 2024-05-15 ENCOUNTER — Encounter: Payer: Self-pay | Admitting: *Deleted

## 2024-05-15 ENCOUNTER — Inpatient Hospital Stay: Admitting: Hematology and Oncology

## 2024-05-15 ENCOUNTER — Inpatient Hospital Stay

## 2024-05-15 ENCOUNTER — Inpatient Hospital Stay: Attending: Hematology and Oncology

## 2024-05-15 VITALS — BP 120/80 | HR 77 | Temp 96.6°F | Resp 18 | Ht 64.0 in | Wt 243.5 lb

## 2024-05-15 DIAGNOSIS — C7951 Secondary malignant neoplasm of bone: Secondary | ICD-10-CM | POA: Diagnosis not present

## 2024-05-15 DIAGNOSIS — C50112 Malignant neoplasm of central portion of left female breast: Secondary | ICD-10-CM | POA: Insufficient documentation

## 2024-05-15 DIAGNOSIS — K760 Fatty (change of) liver, not elsewhere classified: Secondary | ICD-10-CM | POA: Diagnosis not present

## 2024-05-15 DIAGNOSIS — C50919 Malignant neoplasm of unspecified site of unspecified female breast: Secondary | ICD-10-CM

## 2024-05-15 DIAGNOSIS — Z7982 Long term (current) use of aspirin: Secondary | ICD-10-CM | POA: Diagnosis not present

## 2024-05-15 DIAGNOSIS — Z923 Personal history of irradiation: Secondary | ICD-10-CM | POA: Insufficient documentation

## 2024-05-15 DIAGNOSIS — R5383 Other fatigue: Secondary | ICD-10-CM | POA: Diagnosis not present

## 2024-05-15 DIAGNOSIS — C7801 Secondary malignant neoplasm of right lung: Secondary | ICD-10-CM | POA: Insufficient documentation

## 2024-05-15 DIAGNOSIS — Z79899 Other long term (current) drug therapy: Secondary | ICD-10-CM | POA: Insufficient documentation

## 2024-05-15 DIAGNOSIS — Z7952 Long term (current) use of systemic steroids: Secondary | ICD-10-CM | POA: Insufficient documentation

## 2024-05-15 DIAGNOSIS — Z79811 Long term (current) use of aromatase inhibitors: Secondary | ICD-10-CM | POA: Insufficient documentation

## 2024-05-15 DIAGNOSIS — Z1501 Genetic susceptibility to malignant neoplasm of breast: Secondary | ICD-10-CM | POA: Diagnosis not present

## 2024-05-15 DIAGNOSIS — I700 Atherosclerosis of aorta: Secondary | ICD-10-CM | POA: Diagnosis not present

## 2024-05-15 DIAGNOSIS — C787 Secondary malignant neoplasm of liver and intrahepatic bile duct: Secondary | ICD-10-CM | POA: Insufficient documentation

## 2024-05-15 DIAGNOSIS — Z85828 Personal history of other malignant neoplasm of skin: Secondary | ICD-10-CM | POA: Insufficient documentation

## 2024-05-15 DIAGNOSIS — Z9013 Acquired absence of bilateral breasts and nipples: Secondary | ICD-10-CM | POA: Diagnosis not present

## 2024-05-15 DIAGNOSIS — Z170 Estrogen receptor positive status [ER+]: Secondary | ICD-10-CM

## 2024-05-15 DIAGNOSIS — Z5112 Encounter for antineoplastic immunotherapy: Secondary | ICD-10-CM | POA: Diagnosis not present

## 2024-05-15 LAB — CBC WITH DIFFERENTIAL (CANCER CENTER ONLY)
Abs Immature Granulocytes: 0.01 K/uL (ref 0.00–0.07)
Basophils Absolute: 0 K/uL (ref 0.0–0.1)
Basophils Relative: 1 %
Eosinophils Absolute: 0.3 K/uL (ref 0.0–0.5)
Eosinophils Relative: 7 %
HCT: 38.4 % (ref 36.0–46.0)
Hemoglobin: 12.8 g/dL (ref 12.0–15.0)
Immature Granulocytes: 0 %
Lymphocytes Relative: 31 %
Lymphs Abs: 1.1 K/uL (ref 0.7–4.0)
MCH: 30.9 pg (ref 26.0–34.0)
MCHC: 33.3 g/dL (ref 30.0–36.0)
MCV: 92.8 fL (ref 80.0–100.0)
Monocytes Absolute: 0.4 K/uL (ref 0.1–1.0)
Monocytes Relative: 11 %
Neutro Abs: 1.8 K/uL (ref 1.7–7.7)
Neutrophils Relative %: 50 %
Platelet Count: 258 K/uL (ref 150–400)
RBC: 4.14 MIL/uL (ref 3.87–5.11)
RDW: 14.6 % (ref 11.5–15.5)
WBC Count: 3.7 K/uL — ABNORMAL LOW (ref 4.0–10.5)
nRBC: 0 % (ref 0.0–0.2)

## 2024-05-15 LAB — CMP (CANCER CENTER ONLY)
ALT: 49 U/L — ABNORMAL HIGH (ref 0–44)
AST: 35 U/L (ref 15–41)
Albumin: 4.3 g/dL (ref 3.5–5.0)
Alkaline Phosphatase: 76 U/L (ref 38–126)
Anion gap: 7 (ref 5–15)
BUN: 12 mg/dL (ref 6–20)
CO2: 31 mmol/L (ref 22–32)
Calcium: 9.5 mg/dL (ref 8.9–10.3)
Chloride: 105 mmol/L (ref 98–111)
Creatinine: 0.68 mg/dL (ref 0.44–1.00)
GFR, Estimated: >60 mL/min (ref >60)
Glucose, Bld: 113 mg/dL — ABNORMAL HIGH (ref 70–99)
Potassium: 4.6 mmol/L (ref 3.5–5.1)
Sodium: 143 mmol/L (ref 135–145)
Total Bilirubin: 0.4 mg/dL (ref 0.0–1.2)
Total Protein: 7.4 g/dL (ref 6.5–8.1)

## 2024-05-15 MED ORDER — APREPITANT 130 MG/18ML IV EMUL
130.0000 mg | Freq: Once | INTRAVENOUS | Status: AC
  Administered 2024-05-15: 130 mg via INTRAVENOUS
  Filled 2024-05-15: qty 18

## 2024-05-15 MED ORDER — PROMETHAZINE HCL 25 MG PO TABS
25.0000 mg | ORAL_TABLET | Freq: Once | ORAL | Status: AC
  Administered 2024-05-15: 25 mg via ORAL
  Filled 2024-05-15: qty 1

## 2024-05-15 MED ORDER — DEXAMETHASONE SOD PHOSPHATE PF 10 MG/ML IJ SOLN
10.0000 mg | Freq: Once | INTRAMUSCULAR | Status: AC
  Administered 2024-05-15: 10 mg via INTRAVENOUS

## 2024-05-15 MED ORDER — DEXAMETHASONE SODIUM PHOSPHATE 10 MG/ML IJ SOLN
10.0000 mg | Freq: Once | INTRAMUSCULAR | Status: DC
  Filled 2024-05-15: qty 1

## 2024-05-15 MED ORDER — SODIUM CHLORIDE 0.9% FLUSH: 10.0000 mL | INTRAVENOUS | Status: DC | PRN

## 2024-05-15 MED ORDER — DEXTROSE 5 % IV SOLN: Freq: Once | INTRAVENOUS | Status: AC

## 2024-05-15 MED ORDER — DIPHENHYDRAMINE HCL 25 MG PO CAPS
25.0000 mg | ORAL_CAPSULE | Freq: Once | ORAL | Status: AC
  Administered 2024-05-15: 25 mg via ORAL
  Filled 2024-05-15: qty 1

## 2024-05-15 MED ORDER — ACETAMINOPHEN 325 MG PO TABS
650.0000 mg | ORAL_TABLET | Freq: Once | ORAL | Status: AC
  Administered 2024-05-15: 650 mg via ORAL
  Filled 2024-05-15: qty 2

## 2024-05-15 MED ORDER — FAM-TRASTUZUMAB DERUXTECAN-NXKI CHEMO 100 MG IV SOLR
340.0000 mg | Freq: Once | INTRAVENOUS | Status: AC
  Administered 2024-05-15: 340 mg via INTRAVENOUS
  Filled 2024-05-15: qty 17

## 2024-05-15 NOTE — Patient Instructions (Signed)
 [facility] - A DEPT OF [facility]  Discharge Instructions: Thank you for choosing [facility] to provide your oncology and hematology care.   If you have a lab appointment with the Cancer Center, please go directly to the Cancer Center and check in at the registration area.   Wear comfortable clothing and clothing appropriate for easy access to any Portacath or PICC line.   We strive to give you quality time with your provider. You may need to reschedule your appointment if you arrive late (15 or more minutes).  Arriving late affects you and other patients whose appointments are after yours.  Also, if you miss three or more appointments without notifying the office, you may be dismissed from the clinic at the provider's discretion.      For prescription refill requests, have your pharmacy contact our office and allow 72 hours for refills to be completed.    Today you received the following chemotherapy and/or immunotherapy agents enhertu      To help prevent nausea and vomiting after your treatment, we encourage you to take your nausea medication as directed.  BELOW ARE SYMPTOMS THAT SHOULD BE REPORTED IMMEDIATELY: *FEVER GREATER THAN 100.4 F (38 C) OR HIGHER *CHILLS OR SWEATING *NAUSEA AND VOMITING THAT IS NOT CONTROLLED WITH YOUR NAUSEA MEDICATION *UNUSUAL SHORTNESS OF BREATH *UNUSUAL BRUISING OR BLEEDING *URINARY PROBLEMS (pain or burning when urinating, or frequent urination) *BOWEL PROBLEMS (unusual diarrhea, constipation, pain near the anus) TENDERNESS IN MOUTH AND THROAT WITH OR WITHOUT PRESENCE OF ULCERS (sore throat, sores in mouth, or a toothache) UNUSUAL RASH, SWELLING OR PAIN  UNUSUAL VAGINAL DISCHARGE OR ITCHING   Items with * indicate a potential emergency and should be followed up as soon as possible or go to the Emergency Department if any problems should occur.  Please show the CHEMOTHERAPY ALERT CARD or IMMUNOTHERAPY  ALERT CARD at check-in to the Emergency Department and triage nurse.  Should you have questions after your visit or need to cancel or reschedule your appointment, please contact [facility] - A DEPT [facility]  Dept: [phone]  and follow the prompts.  Office hours are 8:00 a.m. to 4:30 p.m. Monday - Friday. Please note that voicemails left after 4:00 p.m. may not be returned until the following business day.  We are closed weekends and major holidays. You have access to a nurse at all times for urgent questions. Please call the main number to the clinic Dept: [phone] and follow the prompts.   For any non-urgent questions, you may also contact your provider using MyChart. We now offer e-Visits for anyone 60 and older to request care online for non-urgent symptoms. For details visit [email or web address].   Also download the MyChart app! Go to the app store, search "MyChart", open the app, select [place], and log in with your MyChart username and password.

## 2024-05-15 NOTE — Progress Notes (Signed)
 Per MD okay to start premeds today with labs pending.

## 2024-05-15 NOTE — Progress Notes (Signed)
 Patient Care Team: Patient, [name] [name], MD as Medical Oncologist (Hematology and Oncology) [name], MD as Consulting Physician (Radiation Oncology) [name], NP as Nurse Practitioner (Hematology and Oncology) [name], MD as Consulting Physician (General Surgery) [name], RPH-CPP as Pharmacist (Hematology and Oncology)  DIAGNOSIS:  Encounter Diagnosis  Name Primary?   Malignant neoplasm of central portion of left breast in female, estrogen receptor positive (HCC) Yes    SUMMARY OF ONCOLOGIC HISTORY: Oncology History  Cancer of central portion of left female breast (HCC)  04/02/2014 Genetic Testing   BRCA1 [id number] pathogenic mutation identified on gene sequce and del/dup analyses of BRCA1 and BRCA2.  BRCA1 [id number] VUS identified as well.  The report date is 04/02/2014.  UPDATE: BRCA1 [id number] VUS has been reclassified as a likely benign variant.  The reclassification date is 04/21/2020.   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 N0 M0 stage IA: Oncotype DX 13 (ROR 9%)   04/16/2014 Surgery   Left upper back melanoma resected no residual melanoma identified on re-resection margins negative   05/26/2014 - 06/08/2015 Anti-estrogen oral therapy   Tamoxifen  20 mg daily with a plan to switch her to aromatase inhibitors once she gets oophorectomy ( patient did not continue antiestrogen therapy by choice)   08/13/2014 Surgery   oophorectomy with total abdominal hysterectomy   09/08/2015 -  Anti-estrogen oral therapy   Resumed anastrozole  after she found recurrence of breast cancer; stopped in January 2018, started letrozole  2.5 mg daily 10/12/2016; switched to Exemestane  25 mg daily on 01/03/17   09/16/2015 Surgery   Skin, left:  IDC grade 3; 0.5 cm, margins negative, ER 95%, PR 10%, HER-2 positive ratio 2.08   11/08/2015 - 02/2017 Chemotherapy   Herceptin  every 3 weeks plus anastrozole    11/24/2015 - 01/14/2016 Radiation Therapy   Adjuvant radiation therapy by Dr. Shannon   01/2017 -  Anti-estrogen oral therapy   Exemestane  daily (stopped others due to side effects), due to muscle aches and pains switch to tamoxifen  5 mg daily starting 12/18/2017   08/07/2019 Relapse/Recurrence   Bone scan: Sclerotic metastatic lesions involving left femoral neck, right ninth rib and entire L5 vertebra; MRI left hip: Diffuse signal abnormality L5 vertebral body with possible extraosseous extension   08/22/2019 - 09/03/2019 Radiation Therapy   Palliative radiation with Dr. Shannon to L5 and left proixmal femur.    10/2021 Treatment Plan Change   Fulvestrant  and Verzenio    08/23/2022 Imaging   CT CAP 08/23/2022: New and enlarged pulmonary nodule consistent with progression.  Enlarged small low cervical and mediastinal nodes.  Multiple bone metastases minimally progressive.  Liver lesions evaluation is challenging because of hepatic steatosis with suspected liver progression, isolated tiny omental nodule     09/07/2022 PET scan   IMPRESSION: 1. Worsening of disease now with nodal, pulmonary and hepatic metastases. 2. Areas of variable FDG uptake with some new bone lesions and some areas of bony metastasis which are more focal, for instance in the pelvis where there was likely diffuse involvement on prior imaging. Many of the areas of sclerosis do not show substantial uptake as outlined above and some areas show improvement/resolution of FDG uptake seen previously but there are signs of active bony metastatic disease currently. 3. Likely LEFT adrenal adenoma. 4. Severe  hepatic steatosis. 5. Aortic atherosclerosis.   Primary malignant neoplasm of breast with metastasis (HCC)  08/25/2019 Initial Diagnosis   Primary malignant neoplasm of  breast with metastasis (HCC)   10/11/2022 -  Chemotherapy   Patient is on Treatment Plan : BREAST METASTATIC Fam-Trastuzumab Deruxtecan-nxki  (Enhertu ) (5.4) q21d       CHIEF COMPLIANT: Enhertu  cycle 25, vision issues  HISTORY OF PRESENT ILLNESS:  History of Present Illness Krystal [name] is a 58 year old female with a history of metastatic breast cancer who is here for Enhertu .  She presents for follow-up regarding her left eye condition.  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  She has concerns about potential residual cancer behind her left eye, with an MRI on April 29, 2024, indicating possible cancer in that area. Her vision is distorted, though she retains the ability to see. There are issues with her port that may require attention to ensure proper function.     ALLERGIES:  is allergic to dilaudid  [hydromorphone ] and codeine.  MEDICATIONS:  Current Outpatient Medications  Medication Sig Dispense Refill   ALPRAZolam  (XANAX ) 0.25 MG tablet Take 1 tablet (0.25 mg total) by mouth at bedtime as needed for anxiety. 30 tablet 3   aspirin  EC 81 MG tablet Take 1 tablet (81 mg total) by mouth daily. Swallow whole. 90 tablet 3   aspirin -acetaminophen -caffeine (EXCEDRIN MIGRAINE) 250-250-65 MG tablet Take 2 tablets by mouth every 6 (six) hours as needed for headache.     CALCIUM  PO Take 1,200 mg by mouth 2 (two) times daily.     dexamethasone  (DECADRON ) 4 MG tablet Take 1 tablets (4 mg) by mouth daily for 3 days after chemotherapy. Take with food. 30 tablet 1   esomeprazole (NEXIUM) 40 MG capsule Take 40 mg by mouth daily.     lidocaine -prilocaine  (EMLA ) cream Apply to affected area once 30 g 3   loperamide (IMODIUM) 2 MG capsule Take 2 mg by mouth daily as needed for diarrhea or loose stools. Take 2 tablets at first diarrhea and then 1 tablet after additional loose stool. Max number in 24 hours is 8 tablets.     loratadine (CLARITIN) 10 MG  tablet Take 10 mg by mouth daily.     meloxicam  (MOBIC ) 15 MG tablet TAKE 1 TABLET(15 MG) BY MOUTH DAILY AS NEEDED FOR PAIN 30 tablet 1   ondansetron  (ZOFRAN ) 8 MG tablet Take 1 tablet (8 mg total) by mouth every 8 (eight) hours as needed for nausea or vomiting. Start on the third day after chemotherapy. 30 tablet 1   prochlorperazine  (COMPAZINE ) 10 MG tablet Take 1 tablet (10 mg total) by mouth every 6 (six) hours as needed for nausea or vomiting. 30 tablet 1   rosuvastatin  (CRESTOR ) 10 MG tablet Take 1 tablet (10 mg total) by mouth daily. 90 tablet 3   traZODone  (DESYREL ) 50 MG tablet TAKE 1 TABLET(50 MG) BY MOUTH AT BEDTIME 90 tablet 3   venlafaxine  XR (EFFEXOR -XR) 75 MG 24 hr capsule TAKE 1 CAPSULE(75 MG) BY MOUTH AT BEDTIME 90 capsule 3   No current facility-administered medications for this visit.    PHYSICAL EXAMINATION: ECOG PERFORMANCE STATUS: 1 - Symptomatic but completely ambulatory  Vitals:   05/15/24 1505  BP: 120/80  Pulse: 77  Resp: 18  Temp: (!) 96.6 F (35.9 C)  SpO2: 96%   Filed Weights   05/15/24 1505  Weight: 243 lb 8 oz (110.5 kg)  LABORATORY DATA:  I have reviewed the data as listed    Latest Ref Rng & Units 04/24/2024    2:01 PM 04/03/2024    2:10 PM 03/13/2024    2:42 PM  CMP  Glucose 70 - 99 mg/dL 879  888  880   BUN 6 - 20 mg/dL 10  7  9    Creatinine 0.44 - 1.00 mg/dL 9.34  9.36  9.37   Sodium 135 - 145 mmol/L 141  141  141   Potassium 3.5 - 5.1 mmol/L 4.0  4.0  4.0   Chloride 98 - 111 mmol/L 104  106  104   CO2 22 - 32 mmol/L 31  31  30    Calcium  8.9 - 10.3 mg/dL 8.8  8.7  9.2   Total Protein 6.5 - 8.1 g/dL 7.0  6.3  6.8   Total Bilirubin 0.0 - 1.2 mg/dL 0.3  0.2  0.3   Alkaline Phos 38 - 126 U/L 80  73  72   AST 15 - 41 U/L 34  29  25   ALT 0 - 44 U/L 51  41  41     Lab Results  Component Value Date   WBC 3.7 (L) 04/24/2024   HGB 12.4 04/24/2024   HCT 36.4 04/24/2024   MCV 91.7 04/24/2024   PLT 287 04/24/2024   NEUTROABS 1.8  04/24/2024    ASSESSMENT & PLAN:  Cancer of central portion of left female breast (HCC) 08/22/2019: PET CT scan: Metastatic disease in the chest with multiple lung nodules all subcentimeter size and hypermetabolic lymph nodes (right paratracheal node 7 mm, subcarinal node 1 cm, right hilar node 6 mm), retroperitoneal and upper pelvic common iliac lymph nodes, bone metastases especially L5, L4, T6, right 10th rib, left proximal femur   Bone biopsy was not felt to be reliable in addition to the effect of radiation on the bone. Palliative radiation completed 09/03/2019 [facility]: BRCA1 mutation, TMB 5.36, MSI: Normal   Prior treatment: Ibrance  with letrozole  and Xgeva for bone metastases switched to abemaciclib  and Fulvestrant    Bone metastasis: Patient does not want to take bisphosphonates CT CAP 08/23/2022: New and enlarged pulmonary nodule consistent with progression.  Enlarged small low cervical and mediastinal nodes.  Multiple bone metastases minimally progressive.  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  Skull metastases, abnormal enhancement left globe  ---------------------------------------------------------------- Current treatment:: Enhertu  cycle 25 Enhertu  toxicities: Fatigue Monitoring closely for toxicities    CT CAP 10/11/2023: Continued decrease in the pulmonary metastases, no significant change in the liver lesions, unchanged bone metastases CT CAP 03/28/2024: Stable bone metastases, no suspicious lung nodules.   Eye metastases: Status post radiation.  Ophthalmologist apparently reviewed her eye and felt it did not shrink.   She continues to have vision problems in the left eye. Dr. Buckley recommended continued MRI surveillance   Brain MRI 04/29/2024: Signal abnormality posterior aspect of the left globe 3 to 4 mm.  Unchanged intracranial metastatic disease unchanged calvarial metastases  Brain tumor board and discussed her case.  The plan is to obtain another MRI in December.  She does not want to do anymore radiation.     No orders  of the defined types were placed in this encounter.  The patient has a good understanding of the overall plan. she agrees with it. she will call with any problems that may develop before the next visit here.  I personally spent a total of 30 minutes in the care of the patient today including preparing to see the patient, getting/reviewing separately obtained history, performing a medically appropriate exam/evaluation, counseling and educating, placing orders, referring and communicating with other health care professionals, documenting clinical information in the EHR, independently interpreting results, communicating results, and coordinating care.   Viinay K Henritta Mutz, MD 05/15/24

## 2024-05-15 NOTE — Assessment & Plan Note (Signed)
 08/22/2019: PET CT scan: Metastatic disease in the chest with multiple lung nodules all subcentimeter size and hypermetabolic lymph nodes (right paratracheal node 7 mm, subcarinal node 1 cm, right hilar node 6 mm), retroperitoneal and upper pelvic common iliac lymph nodes, bone metastases especially L5, L4, T6, right 10th rib, left proximal femur   Bone biopsy was not felt to be reliable in addition to the effect of radiation on the bone. Palliative radiation completed 09/03/2019 [facility]: BRCA1 mutation, TMB 5.36, MSI: Normal   Prior treatment: Ibrance  with letrozole  and Xgeva for bone metastases switched to abemaciclib  and Fulvestrant    Bone metastasis: Patient does not want to take bisphosphonates CT CAP 08/23/2022: New and enlarged pulmonary nodule consistent with progression.  Enlarged small low cervical and mediastinal nodes.  Multiple bone metastases minimally progressive.  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  Skull metastases, abnormal enhancement left globe  ---------------------------------------------------------------- Current treatment:: Enhertu  cycle 25 Enhertu  toxicities: Fatigue Monitoring closely for toxicities    CT CAP 10/11/2023: Continued decrease in the pulmonary metastases, no significant change in the liver lesions, unchanged bone metastases CT CAP 03/28/2024: Stable bone metastases, no suspicious lung nodules.   Eye metastases: Status post radiation.  Ophthalmologist apparently reviewed her eye and felt it did not shrink.  She continues to have vision problems in the left eye. Dr. Buckley recommended continued  MRI surveillance   Brain MRI 04/29/2024: Signal abnormality posterior aspect of the left globe 3 to 4 mm.  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 Patrcia if any additional treatment options are possible for the eye metastases.  We will see if she could be presented and then brain tumor board.

## 2024-05-23 ENCOUNTER — Other Ambulatory Visit (HOSPITAL_COMMUNITY)

## 2024-06-04 ENCOUNTER — Other Ambulatory Visit: Payer: Self-pay | Admitting: Pharmacist

## 2024-06-04 ENCOUNTER — Inpatient Hospital Stay

## 2024-06-04 ENCOUNTER — Encounter: Payer: Self-pay | Admitting: Hematology and Oncology

## 2024-06-04 ENCOUNTER — Inpatient Hospital Stay: Admitting: Hematology and Oncology

## 2024-06-05 ENCOUNTER — Inpatient Hospital Stay

## 2024-06-05 ENCOUNTER — Inpatient Hospital Stay: Admitting: Hematology and Oncology

## 2024-06-05 VITALS — BP 141/81 | HR 77 | Temp 97.7°F | Resp 18 | Ht 64.0 in | Wt 243.9 lb

## 2024-06-05 DIAGNOSIS — Z0000 Encounter for general adult medical examination without abnormal findings: Secondary | ICD-10-CM

## 2024-06-05 DIAGNOSIS — C50919 Malignant neoplasm of unspecified site of unspecified female breast: Secondary | ICD-10-CM

## 2024-06-05 DIAGNOSIS — Z170 Estrogen receptor positive status [ER+]: Secondary | ICD-10-CM | POA: Diagnosis not present

## 2024-06-05 DIAGNOSIS — C50112 Malignant neoplasm of central portion of left female breast: Secondary | ICD-10-CM | POA: Diagnosis not present

## 2024-06-05 LAB — CBC WITH DIFFERENTIAL (CANCER CENTER ONLY)
Abs Immature Granulocytes: 0.01 K/uL (ref 0.00–0.07)
Basophils Absolute: 0 K/uL (ref 0.0–0.1)
Basophils Relative: 1 %
Eosinophils Absolute: 0.3 K/uL (ref 0.0–0.5)
Eosinophils Relative: 7 %
HCT: 37.3 % (ref 36.0–46.0)
Hemoglobin: 12.7 g/dL (ref 12.0–15.0)
Immature Granulocytes: 0 %
Lymphocytes Relative: 27 %
Lymphs Abs: 1.1 K/uL (ref 0.7–4.0)
MCH: 31.5 pg (ref 26.0–34.0)
MCHC: 34 g/dL (ref 30.0–36.0)
MCV: 92.6 fL (ref 80.0–100.0)
Monocytes Absolute: 0.5 K/uL (ref 0.1–1.0)
Monocytes Relative: 11 %
Neutro Abs: 2.3 K/uL (ref 1.7–7.7)
Neutrophils Relative %: 54 %
Platelet Count: 254 K/uL (ref 150–400)
RBC: 4.03 MIL/uL (ref 3.87–5.11)
RDW: 14.4 % (ref 11.5–15.5)
WBC Count: 4.2 K/uL (ref 4.0–10.5)
nRBC: 0 % (ref 0.0–0.2)

## 2024-06-05 LAB — CMP (CANCER CENTER ONLY)
ALT: 55 U/L — ABNORMAL HIGH (ref 0–44)
AST: 43 U/L — ABNORMAL HIGH (ref 15–41)
Albumin: 4.1 g/dL (ref 3.5–5.0)
Alkaline Phosphatase: 77 U/L (ref 38–126)
Anion gap: 7 (ref 5–15)
BUN: 8 mg/dL (ref 6–20)
CO2: 32 mmol/L (ref 22–32)
Calcium: 9.4 mg/dL (ref 8.9–10.3)
Chloride: 103 mmol/L (ref 98–111)
Creatinine: 0.69 mg/dL (ref 0.44–1.00)
GFR, Estimated: >60 mL/min (ref >60)
Glucose, Bld: 114 mg/dL — ABNORMAL HIGH (ref 70–99)
Potassium: 4.2 mmol/L (ref 3.5–5.1)
Sodium: 142 mmol/L (ref 135–145)
Total Bilirubin: 0.4 mg/dL (ref 0.0–1.2)
Total Protein: 7 g/dL (ref 6.5–8.1)

## 2024-06-05 MED ORDER — VENLAFAXINE HCL ER 75 MG PO CP24: 75.0000 mg | ORAL_CAPSULE | Freq: Every day | ORAL | 3 refills | Status: AC

## 2024-06-05 NOTE — Progress Notes (Signed)
 Patient Care Team: Patient, [name] Krystal Potts, MD as Medical Oncologist (Hematology and Oncology) Krystal Agent, MD as Consulting Physician (Radiation Oncology) Krystal Morna Pickle, NP as Nurse Practitioner (Hematology and Oncology) Krystal Cough, MD as Consulting Physician (General Surgery) Krystal [name] LABOR, RPH-CPP as Pharmacist (Hematology and Oncology)  DIAGNOSIS:  Encounter Diagnoses  Name Primary?   Well adult on routine health check Yes   Malignant neoplasm of central portion of left breast in female, estrogen receptor positive (HCC)     SUMMARY OF ONCOLOGIC HISTORY: Oncology History  Cancer of central portion of left female breast (HCC)  04/02/2014 Genetic Testing   BRCA1 [id number] pathogenic mutation identified on gene sequce and del/dup analyses of BRCA1 and BRCA2.  BRCA1 [id number] VUS identified as well.  The report date is 04/02/2014.  UPDATE: BRCA1 [id number] VUS has been reclassified as a likely benign variant.  The reclassification date is 04/21/2020.   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 N0 M0 stage IA: Oncotype DX 13 (ROR 9%)   04/16/2014 Surgery   Left upper back melanoma resected no residual melanoma identified on re-resection margins negative   05/26/2014 - 06/08/2015 Anti-estrogen oral therapy   Tamoxifen  20 mg daily with a plan to switch her to aromatase inhibitors once she gets oophorectomy ( patient did not continue antiestrogen therapy by choice)   08/13/2014 Surgery   oophorectomy with total abdominal hysterectomy   09/08/2015 -  Anti-estrogen oral therapy   Resumed anastrozole  after she found recurrence of breast cancer; stopped in January 2018, started letrozole  2.5 mg daily 10/12/2016; switched to Exemestane  25 mg daily on  01/03/17   09/16/2015 Surgery   Skin, left: IDC grade 3; 0.5 cm, margins negative, ER 95%, PR 10%, HER-2 positive ratio 2.08   11/08/2015 - 02/2017 Chemotherapy   Herceptin  every 3 weeks plus anastrozole    11/24/2015 - 01/14/2016 Radiation Therapy   Adjuvant radiation therapy by Dr. Shannon   01/2017 -  Anti-estrogen oral therapy   Exemestane  daily (stopped others due to side effects), due to muscle aches and pains switch to tamoxifen  5 mg daily starting 12/18/2017   08/07/2019 Relapse/Recurrence   Bone scan: Sclerotic metastatic lesions involving left femoral neck, right ninth rib and entire L5 vertebra; MRI left hip: Diffuse signal abnormality L5 vertebral body with possible extraosseous extension   08/22/2019 - 09/03/2019 Radiation Therapy   Palliative radiation with Dr. Shannon to L5 and left proixmal femur.    10/2021 Treatment Plan Change   Fulvestrant  and Verzenio    08/23/2022 Imaging   CT CAP 08/23/2022: New and enlarged pulmonary nodule consistent with progression.  Enlarged small low cervical and mediastinal nodes.  Multiple bone metastases minimally progressive.  Liver lesions evaluation is challenging because of hepatic steatosis with suspected liver progression, isolated tiny omental nodule     09/07/2022 PET scan   IMPRESSION: 1. Worsening of disease now with nodal, pulmonary and hepatic metastases. 2. Areas of variable FDG uptake with some new bone lesions and some areas of bony metastasis which are more focal, for instance in the pelvis where there was likely diffuse involvement on prior imaging. Many of the areas of sclerosis do not show substantial uptake as outlined above and some areas show improvement/resolution of FDG uptake seen previously but there are signs of active bony metastatic  disease currently. 3. Likely LEFT adrenal adenoma. 4. Severe hepatic steatosis. 5. Aortic atherosclerosis.   Primary malignant neoplasm of breast with metastasis (HCC)  08/25/2019 Initial  Diagnosis   Primary malignant neoplasm of breast with metastasis (HCC)   10/11/2022 -  Chemotherapy   Patient is on Treatment Plan : BREAST METASTATIC Fam-Trastuzumab Deruxtecan-nxki  (Enhertu ) (5.4) q21d       CHIEF COMPLIANT: Enhertu  cycle 26 is tomorrow  HISTORY OF PRESENT ILLNESS:   History of Present Illness Krystal [name] is a 58 year old female who presents for a follow-up visit.  Her vision remains stable without improvement or worsening, though she noticed a change after consuming waffles with syrup. Persistent fatigue is present, and she is considering natural remedies for parasites, including wormwood, cloves, garlic, and papaya seeds. An MRI is scheduled for December, but she plans to reschedule it. Recent lab results show normal blood counts with a slight elevation in liver enzymes. She is currently taking venlafaxine  and trazodone , with concerns about the availability of venlafaxine  refills.     ALLERGIES:  is allergic to dilaudid  [hydromorphone ] and codeine.  MEDICATIONS:  Current Outpatient Medications  Medication Sig Dispense Refill   ALPRAZolam  (XANAX ) 0.25 MG tablet Take 1 tablet (0.25 mg total) by mouth at bedtime as needed for anxiety. 30 tablet 3   aspirin  EC 81 MG tablet Take 1 tablet (81 mg total) by mouth daily. Swallow whole. 90 tablet 3   aspirin -acetaminophen -caffeine (EXCEDRIN MIGRAINE) 250-250-65 MG tablet Take 2 tablets by mouth every 6 (six) hours as needed for headache.     CALCIUM  PO Take 1,200 mg by mouth 2 (two) times daily.     dexamethasone  (DECADRON ) 4 MG tablet Take 1 tablets (4 mg) by mouth daily for 3 days after chemotherapy. Take with food. 30 tablet 1   esomeprazole (NEXIUM) 40 MG capsule Take 40 mg by mouth daily.     lidocaine -prilocaine  (EMLA ) cream Apply to affected area once 30 g 3   loperamide (IMODIUM) 2 MG capsule Take 2 mg by mouth daily as needed for diarrhea or loose stools. Take 2 tablets at first diarrhea and then 1 tablet after  additional loose stool. Max number in 24 hours is 8 tablets.     loratadine (CLARITIN) 10 MG tablet Take 10 mg by mouth daily.     meloxicam  (MOBIC ) 15 MG tablet TAKE 1 TABLET(15 MG) BY MOUTH DAILY AS NEEDED FOR PAIN 30 tablet 1   ondansetron  (ZOFRAN ) 8 MG tablet Take 1 tablet (8 mg total) by mouth every 8 (eight) hours as needed for nausea or vomiting. Start on the third day after chemotherapy. 30 tablet 1   prochlorperazine  (COMPAZINE ) 10 MG tablet Take 1 tablet (10 mg total) by mouth every 6 (six) hours as needed for nausea or vomiting. 30 tablet 1   rosuvastatin  (CRESTOR ) 10 MG tablet Take 1 tablet (10 mg total) by mouth daily. 90 tablet 3   traZODone  (DESYREL ) 50 MG tablet TAKE 1 TABLET(50 MG) BY MOUTH AT BEDTIME 90 tablet 3   venlafaxine  XR (EFFEXOR -XR) 75 MG 24 hr capsule Take 1 capsule (75 mg total) by mouth daily with breakfast. 90 capsule 3   No current facility-administered medications for this visit.    PHYSICAL EXAMINATION: ECOG PERFORMANCE STATUS: 1 - Symptomatic but completely ambulatory  Vitals:   06/05/24 1210  BP: (!) 141/81  Pulse: 77  Resp: 18  Temp: 97.7 F (36.5 C)  SpO2: 97%   Filed Weights   06/05/24 1210  Weight: 243 lb 14.4 oz (110.6 kg)      LABORATORY DATA:  I have reviewed the data as listed    Latest Ref Rng & Units 06/05/2024   11:35 AM 05/15/2024    3:39 PM 04/24/2024    2:01 PM  CMP  Glucose 70 - 99 mg/dL 885  886  879   BUN 6 - 20 mg/dL 8  12  10    Creatinine 0.44 - 1.00 mg/dL 9.30  9.31  9.34   Sodium 135 - 145 mmol/L 142  143  141   Potassium 3.5 - 5.1 mmol/L 4.2  4.6  4.0   Chloride 98 - 111 mmol/L 103  105  104   CO2 22 - 32 mmol/L 32  31  31   Calcium  8.9 - 10.3 mg/dL 9.4  9.5  8.8   Total Protein 6.5 - 8.1 g/dL 7.0  7.4  7.0   Total Bilirubin 0.0 - 1.2 mg/dL 0.4  0.4  0.3   Alkaline Phos 38 - 126 U/L 77  76  80   AST 15 - 41 U/L 43  35  34   ALT 0 - 44 U/L 55  49  51     Lab Results  Component Value Date   WBC 4.2 06/05/2024    HGB 12.7 06/05/2024   HCT 37.3 06/05/2024   MCV 92.6 06/05/2024   PLT 254 06/05/2024   NEUTROABS 2.3 06/05/2024    ASSESSMENT & PLAN:  Cancer of central portion of left female breast (HCC) 08/22/2019: PET CT scan: Metastatic disease in the chest with multiple lung nodules all subcentimeter size and hypermetabolic lymph nodes (right paratracheal node 7 mm, subcarinal node 1 cm, right hilar node 6 mm), retroperitoneal and upper pelvic common iliac lymph nodes, bone metastases especially L5, L4, T6, right 10th rib, left proximal femur   Bone biopsy was not felt to be reliable in addition to the effect of radiation on the bone. Palliative radiation completed 09/03/2019 Guardant 360: BRCA1 mutation, TMB 5.36, MSI: Normal   Prior treatment: Ibrance  with letrozole  and Xgeva for bone metastases switched to abemaciclib  and Fulvestrant    Bone metastasis: Patient does not want to take bisphosphonates CT CAP 08/23/2022: New and enlarged pulmonary nodule consistent with progression.  Enlarged small low cervical and mediastinal nodes.  Multiple bone metastases minimally progressive.  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  Skull metastases, abnormal enhancement left globe  ---------------------------------------------------------------- Current treatment:: Enhertu  cycle 26 Enhertu  toxicities: Fatigue Monitoring closely for toxicities    CT CAP 10/11/2023: Continued decrease in the pulmonary metastases, no significant change in the liver lesions, unchanged bone metastases CT CAP 03/28/2024: Stable bone metastases, no suspicious lung nodules.   Eye  metastases: Status post radiation.  Ophthalmologist apparently reviewed her eye and felt it did not shrink.  She continues to have vision problems in the left eye. Dr. Buckley recommended continued MRI surveillance   Brain MRI 04/29/2024: Signal abnormality posterior aspect of the left globe 3 to 4 mm.  Unchanged intracranial metastatic disease unchanged calvarial metastases   Brain tumor board and discussed her case.  The plan is to obtain another MRI in December.  She does not  want to do anymore radiation.       Orders Placed This Encounter  Procedures   Hemoglobin A1c    Standing Status:   Future    Expected Date:   06/26/2024    Expiration Date:   06/05/2025   The patient has a good understanding of the overall plan. she agrees with it. she will call with any problems that may develop before the next visit here.  I personally spent a total of 30 minutes in the care of the patient today including preparing to see the patient, getting/reviewing separately obtained history, performing a medically appropriate exam/evaluation, counseling and educating, placing orders, referring and communicating with other health care professionals, documenting clinical information in the EHR, independently interpreting results, communicating results, and coordinating care.   Viinay K Vedant Shehadeh, MD 06/05/24

## 2024-06-05 NOTE — Assessment & Plan Note (Signed)
 08/22/2019: PET CT scan: Metastatic disease in the chest with multiple lung nodules all subcentimeter size and hypermetabolic lymph nodes (right paratracheal node 7 mm, subcarinal node 1 cm, right hilar node 6 mm), retroperitoneal and upper pelvic common iliac lymph nodes, bone metastases especially L5, L4, T6, right 10th rib, left proximal femur   Bone biopsy was not felt to be reliable in addition to the effect of radiation on the bone. Palliative radiation completed 09/03/2019 Guardant 360: BRCA1 mutation, TMB 5.36, MSI: Normal   Prior treatment: Ibrance  with letrozole  and Xgeva for bone metastases switched to abemaciclib  and Fulvestrant    Bone metastasis: Patient does not want to take bisphosphonates CT CAP 08/23/2022: New and enlarged pulmonary nodule consistent with progression.  Enlarged small low cervical and mediastinal nodes.  Multiple bone metastases minimally progressive.  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  Skull metastases, abnormal enhancement left globe  ---------------------------------------------------------------- Current treatment:: Enhertu  cycle 26 Enhertu  toxicities: Fatigue Monitoring closely for toxicities    CT CAP 10/11/2023: Continued decrease in the pulmonary metastases, no significant change in the liver lesions, unchanged bone metastases CT CAP 03/28/2024: Stable bone metastases, no suspicious lung nodules.   Eye metastases: Status post radiation.  Ophthalmologist apparently reviewed her eye and felt it did not shrink.  She continues to have vision problems in the left eye. Dr. Buckley recommended continued  MRI surveillance   Brain MRI 04/29/2024: Signal abnormality posterior aspect of the left globe 3 to 4 mm.  Unchanged intracranial metastatic disease unchanged calvarial metastases   Brain tumor board and discussed her case.  The plan is to obtain another MRI in December.  She does not want to do anymore radiation.

## 2024-06-06 ENCOUNTER — Inpatient Hospital Stay

## 2024-06-06 VITALS — BP 145/80 | HR 85 | Temp 97.9°F | Resp 18

## 2024-06-06 DIAGNOSIS — C50112 Malignant neoplasm of central portion of left female breast: Secondary | ICD-10-CM | POA: Diagnosis not present

## 2024-06-06 DIAGNOSIS — C50919 Malignant neoplasm of unspecified site of unspecified female breast: Secondary | ICD-10-CM

## 2024-06-06 MED ORDER — ACETAMINOPHEN 325 MG PO TABS
650.0000 mg | ORAL_TABLET | Freq: Once | ORAL | Status: AC
  Administered 2024-06-06: 650 mg via ORAL
  Filled 2024-06-06: qty 2

## 2024-06-06 MED ORDER — APREPITANT 130 MG/18ML IV EMUL
130.0000 mg | Freq: Once | INTRAVENOUS | Status: AC
  Administered 2024-06-06: 130 mg via INTRAVENOUS
  Filled 2024-06-06: qty 18

## 2024-06-06 MED ORDER — DEXTROSE 5 % IV SOLN: Freq: Once | INTRAVENOUS | Status: AC

## 2024-06-06 MED ORDER — PROMETHAZINE HCL 25 MG PO TABS
25.0000 mg | ORAL_TABLET | Freq: Once | ORAL | Status: AC
  Administered 2024-06-06: 25 mg via ORAL
  Filled 2024-06-06: qty 1

## 2024-06-06 MED ORDER — DIPHENHYDRAMINE HCL 25 MG PO CAPS
25.0000 mg | ORAL_CAPSULE | Freq: Once | ORAL | Status: AC
  Administered 2024-06-06: 25 mg via ORAL
  Filled 2024-06-06: qty 1

## 2024-06-06 MED ORDER — FAM-TRASTUZUMAB DERUXTECAN-NXKI CHEMO 100 MG IV SOLR
340.0000 mg | Freq: Once | INTRAVENOUS | Status: AC
  Administered 2024-06-06: 340 mg via INTRAVENOUS
  Filled 2024-06-06: qty 17

## 2024-06-06 MED ORDER — DEXAMETHASONE SODIUM PHOSPHATE 10 MG/ML IJ SOLN
10.0000 mg | Freq: Once | INTRAMUSCULAR | Status: AC
  Administered 2024-06-06: 10 mg via INTRAVENOUS

## 2024-06-06 NOTE — Patient Instructions (Signed)

## 2024-06-18 ENCOUNTER — Other Ambulatory Visit: Payer: Self-pay

## 2024-06-20 ENCOUNTER — Other Ambulatory Visit: Payer: Self-pay

## 2024-06-23 ENCOUNTER — Ambulatory Visit (HOSPITAL_COMMUNITY)
Admission: RE | Admit: 2024-06-23 | Discharge: 2024-06-23 | Disposition: A | Source: Ambulatory Visit | Attending: Hematology and Oncology

## 2024-06-23 DIAGNOSIS — Z79899 Other long term (current) drug therapy: Secondary | ICD-10-CM | POA: Diagnosis not present

## 2024-06-23 DIAGNOSIS — Z5181 Encounter for therapeutic drug level monitoring: Secondary | ICD-10-CM | POA: Diagnosis not present

## 2024-06-23 DIAGNOSIS — E119 Type 2 diabetes mellitus without complications: Secondary | ICD-10-CM | POA: Diagnosis not present

## 2024-06-23 DIAGNOSIS — I427 Cardiomyopathy due to drug and external agent: Secondary | ICD-10-CM | POA: Insufficient documentation

## 2024-06-23 DIAGNOSIS — Z853 Personal history of malignant neoplasm of breast: Secondary | ICD-10-CM | POA: Insufficient documentation

## 2024-06-23 DIAGNOSIS — T451X5A Adverse effect of antineoplastic and immunosuppressive drugs, initial encounter: Secondary | ICD-10-CM | POA: Diagnosis not present

## 2024-06-23 LAB — ECHOCARDIOGRAM COMPLETE
AR max vel: 2.22 cm2
AV Area VTI: 2.25 cm2
AV Area mean vel: 2.09 cm2
AV Mean grad: 3 mmHg
AV Peak grad: 6.2 mmHg
Ao pk vel: 1.24 m/s
Area-P 1/2: 4.15 cm2
Calc EF: 54.7 %
S' Lateral: 3.5 cm
Single Plane A2C EF: 52.8 %
Single Plane A4C EF: 56.3 %

## 2024-06-25 ENCOUNTER — Other Ambulatory Visit: Payer: Self-pay | Admitting: Hematology and Oncology

## 2024-06-26 ENCOUNTER — Inpatient Hospital Stay: Attending: Hematology and Oncology

## 2024-06-26 ENCOUNTER — Inpatient Hospital Stay: Attending: Hematology and Oncology | Admitting: Hematology and Oncology

## 2024-06-26 ENCOUNTER — Inpatient Hospital Stay

## 2024-06-26 VITALS — BP 132/83 | HR 75 | Resp 16

## 2024-06-26 VITALS — BP 135/80 | HR 87 | Temp 98.0°F | Resp 18 | Ht 64.0 in | Wt 241.8 lb

## 2024-06-26 DIAGNOSIS — Z9013 Acquired absence of bilateral breasts and nipples: Secondary | ICD-10-CM | POA: Diagnosis not present

## 2024-06-26 DIAGNOSIS — Z1501 Genetic susceptibility to malignant neoplasm of breast: Secondary | ICD-10-CM | POA: Insufficient documentation

## 2024-06-26 DIAGNOSIS — C50112 Malignant neoplasm of central portion of left female breast: Secondary | ICD-10-CM | POA: Diagnosis present

## 2024-06-26 DIAGNOSIS — Z170 Estrogen receptor positive status [ER+]: Secondary | ICD-10-CM | POA: Diagnosis not present

## 2024-06-26 DIAGNOSIS — Z7952 Long term (current) use of systemic steroids: Secondary | ICD-10-CM | POA: Insufficient documentation

## 2024-06-26 DIAGNOSIS — Z0000 Encounter for general adult medical examination without abnormal findings: Secondary | ICD-10-CM

## 2024-06-26 DIAGNOSIS — Z7982 Long term (current) use of aspirin: Secondary | ICD-10-CM | POA: Insufficient documentation

## 2024-06-26 DIAGNOSIS — Z923 Personal history of irradiation: Secondary | ICD-10-CM | POA: Diagnosis not present

## 2024-06-26 DIAGNOSIS — Z90722 Acquired absence of ovaries, bilateral: Secondary | ICD-10-CM | POA: Insufficient documentation

## 2024-06-26 DIAGNOSIS — Z90710 Acquired absence of both cervix and uterus: Secondary | ICD-10-CM | POA: Insufficient documentation

## 2024-06-26 DIAGNOSIS — I700 Atherosclerosis of aorta: Secondary | ICD-10-CM | POA: Diagnosis not present

## 2024-06-26 DIAGNOSIS — C7800 Secondary malignant neoplasm of unspecified lung: Secondary | ICD-10-CM | POA: Insufficient documentation

## 2024-06-26 DIAGNOSIS — Z79899 Other long term (current) drug therapy: Secondary | ICD-10-CM | POA: Insufficient documentation

## 2024-06-26 DIAGNOSIS — Z5112 Encounter for antineoplastic immunotherapy: Secondary | ICD-10-CM | POA: Diagnosis not present

## 2024-06-26 DIAGNOSIS — T451X5A Adverse effect of antineoplastic and immunosuppressive drugs, initial encounter: Secondary | ICD-10-CM | POA: Insufficient documentation

## 2024-06-26 DIAGNOSIS — R110 Nausea: Secondary | ICD-10-CM | POA: Diagnosis not present

## 2024-06-26 DIAGNOSIS — C7951 Secondary malignant neoplasm of bone: Secondary | ICD-10-CM | POA: Diagnosis not present

## 2024-06-26 DIAGNOSIS — Z79811 Long term (current) use of aromatase inhibitors: Secondary | ICD-10-CM | POA: Diagnosis not present

## 2024-06-26 DIAGNOSIS — C778 Secondary and unspecified malignant neoplasm of lymph nodes of multiple regions: Secondary | ICD-10-CM | POA: Diagnosis not present

## 2024-06-26 DIAGNOSIS — C50919 Malignant neoplasm of unspecified site of unspecified female breast: Secondary | ICD-10-CM

## 2024-06-26 DIAGNOSIS — Z9221 Personal history of antineoplastic chemotherapy: Secondary | ICD-10-CM | POA: Insufficient documentation

## 2024-06-26 DIAGNOSIS — C787 Secondary malignant neoplasm of liver and intrahepatic bile duct: Secondary | ICD-10-CM | POA: Diagnosis not present

## 2024-06-26 LAB — CMP (CANCER CENTER ONLY)
ALT: 57 U/L — ABNORMAL HIGH (ref 0–44)
AST: 40 U/L (ref 15–41)
Albumin: 4.1 g/dL (ref 3.5–5.0)
Alkaline Phosphatase: 85 U/L (ref 38–126)
Anion gap: 10 (ref 5–15)
BUN: 7 mg/dL (ref 6–20)
CO2: 27 mmol/L (ref 22–32)
Calcium: 9.2 mg/dL (ref 8.9–10.3)
Chloride: 104 mmol/L (ref 98–111)
Creatinine: 0.66 mg/dL (ref 0.44–1.00)
GFR, Estimated: >60 mL/min (ref >60)
Glucose, Bld: 181 mg/dL — ABNORMAL HIGH (ref 70–99)
Potassium: 4 mmol/L (ref 3.5–5.1)
Sodium: 141 mmol/L (ref 135–145)
Total Bilirubin: 0.3 mg/dL (ref 0.0–1.2)
Total Protein: 6.9 g/dL (ref 6.5–8.1)

## 2024-06-26 LAB — HEMOGLOBIN A1C
Hgb A1c MFr Bld: 6.6 % — ABNORMAL HIGH (ref 4.8–5.6)
Mean Plasma Glucose: 142.72 mg/dL

## 2024-06-26 LAB — CBC WITH DIFFERENTIAL (CANCER CENTER ONLY)
Abs Immature Granulocytes: 0.01 K/uL (ref 0.00–0.07)
Basophils Absolute: 0 K/uL (ref 0.0–0.1)
Basophils Relative: 1 %
Eosinophils Absolute: 0.3 K/uL (ref 0.0–0.5)
Eosinophils Relative: 9 %
HCT: 37.7 % (ref 36.0–46.0)
Hemoglobin: 12.7 g/dL (ref 12.0–15.0)
Immature Granulocytes: 0 %
Lymphocytes Relative: 30 %
Lymphs Abs: 1 K/uL (ref 0.7–4.0)
MCH: 31.3 pg (ref 26.0–34.0)
MCHC: 33.7 g/dL (ref 30.0–36.0)
MCV: 92.9 fL (ref 80.0–100.0)
Monocytes Absolute: 0.3 K/uL (ref 0.1–1.0)
Monocytes Relative: 10 %
Neutro Abs: 1.7 K/uL (ref 1.7–7.7)
Neutrophils Relative %: 50 %
Platelet Count: 245 K/uL (ref 150–400)
RBC: 4.06 MIL/uL (ref 3.87–5.11)
RDW: 14.2 % (ref 11.5–15.5)
WBC Count: 3.3 K/uL — ABNORMAL LOW (ref 4.0–10.5)
nRBC: 0 % (ref 0.0–0.2)

## 2024-06-26 MED ORDER — DIPHENHYDRAMINE HCL 25 MG PO CAPS
25.0000 mg | ORAL_CAPSULE | Freq: Once | ORAL | Status: AC
  Administered 2024-06-26: 25 mg via ORAL
  Filled 2024-06-26: qty 1

## 2024-06-26 MED ORDER — APREPITANT 130 MG/18ML IV EMUL
130.0000 mg | Freq: Once | INTRAVENOUS | Status: AC
  Administered 2024-06-26: 130 mg via INTRAVENOUS
  Filled 2024-06-26: qty 18

## 2024-06-26 MED ORDER — FAM-TRASTUZUMAB DERUXTECAN-NXKI CHEMO 100 MG IV SOLR
340.0000 mg | Freq: Once | INTRAVENOUS | Status: AC
  Administered 2024-06-26: 340 mg via INTRAVENOUS
  Filled 2024-06-26: qty 17

## 2024-06-26 MED ORDER — PROMETHAZINE HCL 25 MG PO TABS
25.0000 mg | ORAL_TABLET | Freq: Once | ORAL | Status: AC
  Administered 2024-06-26: 25 mg via ORAL
  Filled 2024-06-26: qty 1

## 2024-06-26 MED ORDER — DEXTROSE 5 % IV SOLN: Freq: Once | INTRAVENOUS | Status: AC

## 2024-06-26 MED ORDER — ACETAMINOPHEN 325 MG PO TABS
650.0000 mg | ORAL_TABLET | Freq: Once | ORAL | Status: AC
  Administered 2024-06-26: 650 mg via ORAL
  Filled 2024-06-26: qty 2

## 2024-06-26 MED ORDER — DEXAMETHASONE SODIUM PHOSPHATE 10 MG/ML IJ SOLN
10.0000 mg | Freq: Once | INTRAMUSCULAR | Status: AC
  Administered 2024-06-26: 10 mg via INTRAVENOUS

## 2024-06-26 NOTE — Assessment & Plan Note (Signed)
 08/22/2019: PET CT scan: Metastatic disease in the chest with multiple lung nodules all subcentimeter size and hypermetabolic lymph nodes (right paratracheal node 7 mm, subcarinal node 1 cm, right hilar node 6 mm), retroperitoneal and upper pelvic common iliac lymph nodes, bone metastases especially L5, L4, T6, right 10th rib, left proximal femur   Bone biopsy was not felt to be reliable in addition to the effect of radiation on the bone. Palliative radiation completed 09/03/2019 Guardant 360: BRCA1 mutation, TMB 5.36, MSI: Normal   Prior treatment: Ibrance  with letrozole  and Xgeva for bone metastases switched to abemaciclib  and Fulvestrant    Bone metastasis: Patient does not want to take bisphosphonates CT CAP 08/23/2022: New and enlarged pulmonary nodule consistent with progression.  Enlarged small low cervical and mediastinal nodes.  Multiple bone metastases minimally progressive.  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  Skull metastases, abnormal enhancement left globe  ---------------------------------------------------------------- Current treatment:: Enhertu  cycle 27 Enhertu  toxicities: Fatigue Monitoring closely for toxicities    CT CAP 10/11/2023: Continued decrease in the pulmonary metastases, no significant change in the liver lesions, unchanged bone metastases CT CAP 03/28/2024: Stable bone metastases, no suspicious lung nodules.   Eye metastases: Status post radiation.  Ophthalmologist apparently reviewed her eye and felt it did not shrink.  She continues to have vision problems in the left eye. Dr. Buckley recommended continued  MRI surveillance   Brain MRI 04/29/2024: Signal abnormality posterior aspect of the left globe 3 to 4 mm.  Unchanged intracranial metastatic disease unchanged calvarial metastases   Brain tumor board and discussed her case.  The plan is to obtain another MRI in December.  She does not want to do anymore radiation.

## 2024-06-26 NOTE — Patient Instructions (Signed)
 [facility] - A DEPT OF [facility]  Discharge Instructions: Thank you for choosing [facility] to provide your oncology and hematology care.   If you have a lab appointment with the Cancer Center, please go directly to the Cancer Center and check in at the registration area.   Wear comfortable clothing and clothing appropriate for easy access to any Portacath or PICC line.   We strive to give you quality time with your provider. You may need to reschedule your appointment if you arrive late (15 or more minutes).  Arriving late affects you and other patients whose appointments are after yours.  Also, if you miss three or more appointments without notifying the office, you may be dismissed from the clinic at the provider's discretion.      For prescription refill requests, have your pharmacy contact our office and allow 72 hours for refills to be completed.    Today you received the following chemotherapy and/or immunotherapy agents: Kadcyla .       To help prevent nausea and vomiting after your treatment, we encourage you to take your nausea medication as directed.  BELOW ARE SYMPTOMS THAT SHOULD BE REPORTED IMMEDIATELY: *FEVER GREATER THAN 100.4 F (38 C) OR HIGHER *CHILLS OR SWEATING *NAUSEA AND VOMITING THAT IS NOT CONTROLLED WITH YOUR NAUSEA MEDICATION *UNUSUAL SHORTNESS OF BREATH *UNUSUAL BRUISING OR BLEEDING *URINARY PROBLEMS (pain or burning when urinating, or frequent urination) *BOWEL PROBLEMS (unusual diarrhea, constipation, pain near the anus) TENDERNESS IN MOUTH AND THROAT WITH OR WITHOUT PRESENCE OF ULCERS (sore throat, sores in mouth, or a toothache) UNUSUAL RASH, SWELLING OR PAIN  UNUSUAL VAGINAL DISCHARGE OR ITCHING   Items with * indicate a potential emergency and should be followed up as soon as possible or go to the Emergency Department if any problems should occur.  Please show the CHEMOTHERAPY ALERT CARD or IMMUNOTHERAPY  ALERT CARD at check-in to the Emergency Department and triage nurse.  Should you have questions after your visit or need to cancel or reschedule your appointment, please contact [facility] - A DEPT OF [facility]  Dept: [phone]  and follow the prompts.  Office hours are 8:00 a.m. to 4:30 p.m. Monday - Friday. Please note that voicemails left after 4:00 p.m. may not be returned until the following business day.  We are closed weekends and major holidays. You have access to a nurse at all times for urgent questions. Please call the main number to the clinic Dept: [phone] and follow the prompts.   For any non-urgent questions, you may also contact your provider using MyChart. We now offer e-Visits for anyone 98 and older to request care online for non-urgent symptoms. For details visit [email or web address].   Also download the MyChart app! Go to the app store, search MyChart, open the app, select [facility], and log in with your MyChart username and password.

## 2024-06-26 NOTE — Progress Notes (Signed)
 Patient Care Team: Patient, [name] [name], MD as Medical Oncologist (Hematology and Oncology) [name], MD as Consulting Physician (Radiation Oncology) [name], NP as Nurse Practitioner (Hematology and Oncology) [name], MD as Consulting Physician (General Surgery) [name], RPH-CPP as Pharmacist (Hematology and Oncology)  DIAGNOSIS:  Encounter Diagnosis  Name Primary?   Malignant neoplasm of central portion of left breast in female, estrogen receptor positive (HCC) Yes    SUMMARY OF ONCOLOGIC HISTORY: Oncology History  Cancer of central portion of left female breast (HCC)  04/02/2014 Genetic Testing   BRCA1 [id number] pathogenic mutation identified on gene sequce and del/dup analyses of BRCA1 and BRCA2.  BRCA1 p.H1283R VUS identified as well.  The report date is 04/02/2014.  UPDATE: BRCA1 e.Y8716M VUS has been reclassified as a likely benign variant.  The reclassification date is 04/21/2020.   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 N0 M0 stage IA: Oncotype DX 13 (ROR 9%)   04/16/2014 Surgery   Left upper back melanoma resected no residual melanoma identified on re-resection margins negative   05/26/2014 - 06/08/2015 Anti-estrogen oral therapy   Tamoxifen  20 mg daily with a plan to switch her to aromatase inhibitors once she gets oophorectomy ( patient did not continue antiestrogen therapy by choice)   08/13/2014 Surgery   oophorectomy with total abdominal hysterectomy   09/08/2015 -  Anti-estrogen oral therapy   Resumed anastrozole  after she found recurrence of breast cancer; stopped in January 2018, started letrozole  2.5 mg daily 10/12/2016; switched to Exemestane  25 mg daily on 01/03/17   09/16/2015 Surgery   Skin, left:  IDC grade 3; 0.5 cm, margins negative, ER 95%, PR 10%, HER-2 positive ratio 2.08   11/08/2015 - 02/2017 Chemotherapy   Herceptin  every 3 weeks plus anastrozole    11/24/2015 - 01/14/2016 Radiation Therapy   Adjuvant radiation therapy by Dr. Shannon   01/2017 -  Anti-estrogen oral therapy   Exemestane  daily (stopped others due to side effects), due to muscle aches and pains switch to tamoxifen  5 mg daily starting 12/18/2017   08/07/2019 Relapse/Recurrence   Bone scan: Sclerotic metastatic lesions involving left femoral neck, right ninth rib and entire L5 vertebra; MRI left hip: Diffuse signal abnormality L5 vertebral body with possible extraosseous extension   08/22/2019 - 09/03/2019 Radiation Therapy   Palliative radiation with Dr. Shannon to L5 and left proixmal femur.    10/2021 Treatment Plan Change   Fulvestrant  and Verzenio    08/23/2022 Imaging   CT CAP 08/23/2022: New and enlarged pulmonary nodule consistent with progression.  Enlarged small low cervical and mediastinal nodes.  Multiple bone metastases minimally progressive.  Liver lesions evaluation is challenging because of hepatic steatosis with suspected liver progression, isolated tiny omental nodule     09/07/2022 PET scan   IMPRESSION: 1. Worsening of disease now with nodal, pulmonary and hepatic metastases. 2. Areas of variable FDG uptake with some new bone lesions and some areas of bony metastasis which are more focal, for instance in the pelvis where there was likely diffuse involvement on prior imaging. Many of the areas of sclerosis do not show substantial uptake as outlined above and some areas show improvement/resolution of FDG uptake seen previously but there are signs of active bony metastatic disease currently. 3. Likely LEFT adrenal adenoma. 4. Severe  hepatic steatosis. 5. Aortic atherosclerosis.   Primary malignant neoplasm of breast with metastasis (HCC)  08/25/2019 Initial Diagnosis   Primary malignant neoplasm of  breast with metastasis (HCC)   10/11/2022 -  Chemotherapy   Patient is on Treatment Plan : BREAST METASTATIC Fam-Trastuzumab Deruxtecan-nxki  (Enhertu ) (5.4) q21d       CHIEF COMPLIANT: Enhertu  cycle 27  HISTORY OF PRESENT ILLNESS:   History of Present Illness Krystal [name] is a 58 year old female who presents for a follow-up visit during chemotherapy treatment.  She experiences sensitivity to fragrances during chemotherapy infusions, which worsens her symptoms. Nausea occurs during infusions but is managed with nausea medication and steroids administered at a slower rate.  Recent blood work shows a slightly low white blood cell count, with adequate neutrophil levels. Hemoglobin is 12.7, and platelet levels are normal.  She takes venlafaxine  and trazodone  and is concerned about pharmacy delays affecting her medication availability, particularly venlafaxine . Her meloxicam  prescription has been rejected by her insurance.     ALLERGIES:  is allergic to dilaudid  [hydromorphone ] and codeine.  MEDICATIONS:  Current Outpatient Medications  Medication Sig Dispense Refill   ALPRAZolam  (XANAX ) 0.25 MG tablet Take 1 tablet (0.25 mg total) by mouth at bedtime as needed for anxiety. 30 tablet 3   aspirin  EC 81 MG tablet Take 1 tablet (81 mg total) by mouth daily. Swallow whole. 90 tablet 3   aspirin -acetaminophen -caffeine (EXCEDRIN MIGRAINE) 250-250-65 MG tablet Take 2 tablets by mouth every 6 (six) hours as needed for headache.     CALCIUM  PO Take 1,200 mg by mouth 2 (two) times daily.     dexamethasone  (DECADRON ) 4 MG tablet Take 1 tablets (4 mg) by mouth daily for 3 days after chemotherapy. Take with food. 30 tablet 1   esomeprazole (NEXIUM) 40 MG capsule Take 40 mg by mouth daily.     lidocaine -prilocaine  (EMLA ) cream Apply to affected area once 30 g 3   loperamide (IMODIUM) 2 MG capsule Take 2 mg by mouth daily as needed for diarrhea or loose stools. Take 2 tablets at first diarrhea and then 1  tablet after additional loose stool. Max number in 24 hours is 8 tablets.     loratadine (CLARITIN) 10 MG tablet Take 10 mg by mouth daily.     meloxicam  (MOBIC ) 15 MG tablet TAKE 1 TABLET(15 MG) BY MOUTH DAILY AS NEEDED FOR PAIN 30 tablet 1   ondansetron  (ZOFRAN ) 8 MG tablet Take 1 tablet (8 mg total) by mouth every 8 (eight) hours as needed for nausea or vomiting. Start on the third day after chemotherapy. 30 tablet 1   prochlorperazine  (COMPAZINE ) 10 MG tablet Take 1 tablet (10 mg total) by mouth every 6 (six) hours as needed for nausea or vomiting. 30 tablet 1   rosuvastatin  (CRESTOR ) 10 MG tablet Take 1 tablet (10 mg total) by mouth daily. 90 tablet 3   traZODone  (DESYREL ) 50 MG tablet TAKE 1 TABLET(50 MG) BY MOUTH AT BEDTIME 90 tablet 3   venlafaxine  XR (EFFEXOR -XR) 75 MG 24 hr capsule Take 1 capsule (75 mg total) by mouth daily with breakfast. 90 capsule 3   No current facility-administered medications for this visit.   Facility-Administered Medications Ordered in Other Visits  Medication Dose Route Frequency Provider Last Rate Last Admin   acetaminophen  (TYLENOL ) tablet 650 mg  650 mg Oral Once Verena Shawgo, MD       aprepitant  (CINVANTI ) injection 130 mg  130 mg Intravenous Once Monserath Neff, MD  dexamethasone  (DECADRON ) injection 10 mg  10 mg Intravenous Once Danni Leabo, MD       diphenhydrAMINE  (BENADRYL ) capsule 25 mg  25 mg Oral Once Numa Schroeter, MD       fam-trastuzumab deruxtecan-nxki  (ENHERTU ) 340 mg in dextrose  5 % 100 mL chemo infusion  340 mg Intravenous Once Aerilynn Goin, MD       promethazine  (PHENERGAN ) tablet 25 mg  25 mg Oral Once [name], NP        PHYSICAL EXAMINATION: ECOG PERFORMANCE STATUS: 1 - Symptomatic but completely ambulatory  Vitals:   06/26/24 1518  BP: 135/80  Pulse: 87  Resp: 18  Temp: 98 F (36.7 C)  SpO2: 100%   Filed Weights   06/26/24 1518  Weight: 241 lb 12.8 oz (109.7 kg)      LABORATORY DATA:  I have  reviewed the data as listed    Latest Ref Rng & Units 06/26/2024    2:48 PM 06/05/2024   11:35 AM 05/15/2024    3:39 PM  CMP  Glucose 70 - 99 mg/dL 818  885  886   BUN 6 - 20 mg/dL 7  8  12    Creatinine 0.44 - 1.00 mg/dL 9.33  9.30  9.31   Sodium 135 - 145 mmol/L 141  142  143   Potassium 3.5 - 5.1 mmol/L 4.0  4.2  4.6   Chloride 98 - 111 mmol/L 104  103  105   CO2 22 - 32 mmol/L 27  32  31   Calcium  8.9 - 10.3 mg/dL 9.2  9.4  9.5   Total Protein 6.5 - 8.1 g/dL 6.9  7.0  7.4   Total Bilirubin 0.0 - 1.2 mg/dL 0.3  0.4  0.4   Alkaline Phos 38 - 126 U/L 85  77  76   AST 15 - 41 U/L 40  43  35   ALT 0 - 44 U/L 57  55  49     Lab Results  Component Value Date   WBC 3.3 (L) 06/26/2024   HGB 12.7 06/26/2024   HCT 37.7 06/26/2024   MCV 92.9 06/26/2024   PLT 245 06/26/2024   NEUTROABS 1.7 06/26/2024    ASSESSMENT & PLAN:  Cancer of central portion of left female breast (HCC) 08/22/2019: PET CT scan: Metastatic disease in the chest with multiple lung nodules all subcentimeter size and hypermetabolic lymph nodes (right paratracheal node 7 mm, subcarinal node 1 cm, right hilar node 6 mm), retroperitoneal and upper pelvic common iliac lymph nodes, bone metastases especially L5, L4, T6, right 10th rib, left proximal femur   Bone biopsy was not felt to be reliable in addition to the effect of radiation on the bone. Palliative radiation completed 09/03/2019 [facility] 360: BRCA1 mutation, TMB 5.36, MSI: Normal   Prior treatment: Ibrance  with letrozole  and Xgeva for bone metastases switched to abemaciclib  and Fulvestrant    Bone metastasis: Patient does not want to take bisphosphonates CT CAP 08/23/2022: New and enlarged pulmonary nodule consistent with progression.  Enlarged small low cervical and mediastinal nodes.  Multiple bone metastases minimally progressive.  Liver lesions evaluation is challenging because of hepatic steatosis with suspected liver progression, isolated tiny omental nodule    Ultrasound left supraclavicular biopsy 09/22/2022: Metastatic breast cancer ER 90%, PR 0%, Ki-67 60%, HER2 1+ Brain MRI: 10/03/2022: Small foci of enhancement in the right central sulcus and within an anterior right frontal sulcus concerning for leptomeningeal disease.  Skull metastases, abnormal enhancement left  globe  ---------------------------------------------------------------- Current treatment:: Enhertu  cycle 27 Enhertu  toxicities: Fatigue Monitoring closely for toxicities    CT CAP 10/11/2023: Continued decrease in the pulmonary metastases, no significant change in the liver lesions, unchanged bone metastases CT CAP 03/28/2024: Stable bone metastases, no suspicious lung nodules.   Eye metastases: Status post radiation.  Ophthalmologist apparently reviewed her eye and felt it did not shrink.  She continues to have vision problems in the left eye. Dr. Buckley recommended continued MRI surveillance   Brain MRI 04/29/2024: Signal abnormality posterior aspect of the left globe 3 to 4 mm.  Unchanged intracranial metastatic disease unchanged calvarial metastases   Brain tumor board and discussed her case.  The plan is to obtain another MRI in December.  She does not want to do anymore radiation. ------------------------------------- Assessment and Plan Assessment & Plan Malignant neoplasm of central portion of left breast, ER+ No new symptoms. Previous scan showed minimal findings. MRI rescheduled to January 2nd, 2026.  Chemotherapy-induced nausea Nausea well-managed with current antiemetic regimen and adjusted administration rate.  Leukopenia secondary to chemotherapy Slightly low white blood cell count, adequate neutrophil count at 1.7, consistent with previous results.  Depression Managed with venlafaxine  and trazodone . Recent refill issues resolved. Reports feeling calm. - Ensured adequate supply of venlafaxine  and trazodone  with refills available until March.      No orders of  the defined types were placed in this encounter.  The patient has a good understanding of the overall plan. she agrees with it. she will call with any problems that may develop before the next visit here.  I personally spent a total of 30 minutes in the care of the patient today including preparing to see the patient, getting/reviewing separately obtained history, performing a medically appropriate exam/evaluation, counseling and educating, placing orders, referring and communicating with other health care professionals, documenting clinical information in the EHR, independently interpreting results, communicating results, and coordinating care.   Viinay K Jayen Bromwell, MD 06/26/24

## 2024-07-14 ENCOUNTER — Other Ambulatory Visit: Payer: Self-pay | Admitting: Hematology and Oncology

## 2024-07-17 ENCOUNTER — Inpatient Hospital Stay: Attending: Hematology and Oncology

## 2024-07-17 ENCOUNTER — Inpatient Hospital Stay

## 2024-07-17 ENCOUNTER — Inpatient Hospital Stay (HOSPITAL_BASED_OUTPATIENT_CLINIC_OR_DEPARTMENT_OTHER): Admitting: Hematology and Oncology

## 2024-07-17 VITALS — BP 145/94 | HR 114 | Temp 98.0°F | Resp 18 | Wt 242.7 lb

## 2024-07-17 VITALS — HR 97

## 2024-07-17 DIAGNOSIS — C7949 Secondary malignant neoplasm of other parts of nervous system: Secondary | ICD-10-CM | POA: Diagnosis not present

## 2024-07-17 DIAGNOSIS — C50112 Malignant neoplasm of central portion of left female breast: Secondary | ICD-10-CM | POA: Insufficient documentation

## 2024-07-17 DIAGNOSIS — H539 Unspecified visual disturbance: Secondary | ICD-10-CM | POA: Insufficient documentation

## 2024-07-17 DIAGNOSIS — Z9221 Personal history of antineoplastic chemotherapy: Secondary | ICD-10-CM | POA: Diagnosis not present

## 2024-07-17 DIAGNOSIS — Z9013 Acquired absence of bilateral breasts and nipples: Secondary | ICD-10-CM | POA: Insufficient documentation

## 2024-07-17 DIAGNOSIS — C7931 Secondary malignant neoplasm of brain: Secondary | ICD-10-CM | POA: Diagnosis not present

## 2024-07-17 DIAGNOSIS — Z7952 Long term (current) use of systemic steroids: Secondary | ICD-10-CM | POA: Diagnosis not present

## 2024-07-17 DIAGNOSIS — Z79899 Other long term (current) drug therapy: Secondary | ICD-10-CM | POA: Diagnosis not present

## 2024-07-17 DIAGNOSIS — Z1501 Genetic susceptibility to malignant neoplasm of breast: Secondary | ICD-10-CM | POA: Insufficient documentation

## 2024-07-17 DIAGNOSIS — Z90722 Acquired absence of ovaries, bilateral: Secondary | ICD-10-CM | POA: Diagnosis not present

## 2024-07-17 DIAGNOSIS — C50919 Malignant neoplasm of unspecified site of unspecified female breast: Secondary | ICD-10-CM

## 2024-07-17 DIAGNOSIS — Z170 Estrogen receptor positive status [ER+]: Secondary | ICD-10-CM | POA: Insufficient documentation

## 2024-07-17 DIAGNOSIS — Z79810 Long term (current) use of selective estrogen receptor modulators (SERMs): Secondary | ICD-10-CM | POA: Insufficient documentation

## 2024-07-17 DIAGNOSIS — C7951 Secondary malignant neoplasm of bone: Secondary | ICD-10-CM | POA: Diagnosis not present

## 2024-07-17 DIAGNOSIS — Z923 Personal history of irradiation: Secondary | ICD-10-CM | POA: Diagnosis not present

## 2024-07-17 DIAGNOSIS — I700 Atherosclerosis of aorta: Secondary | ICD-10-CM | POA: Diagnosis not present

## 2024-07-17 DIAGNOSIS — K760 Fatty (change of) liver, not elsewhere classified: Secondary | ICD-10-CM | POA: Diagnosis not present

## 2024-07-17 DIAGNOSIS — Z90710 Acquired absence of both cervix and uterus: Secondary | ICD-10-CM | POA: Insufficient documentation

## 2024-07-17 DIAGNOSIS — Z7982 Long term (current) use of aspirin: Secondary | ICD-10-CM | POA: Diagnosis not present

## 2024-07-17 DIAGNOSIS — Z5112 Encounter for antineoplastic immunotherapy: Secondary | ICD-10-CM | POA: Diagnosis not present

## 2024-07-17 LAB — CBC WITH DIFFERENTIAL (CANCER CENTER ONLY)
Abs Immature Granulocytes: 0.01 K/uL (ref 0.00–0.07)
Basophils Absolute: 0.1 K/uL (ref 0.0–0.1)
Basophils Relative: 1 %
Eosinophils Absolute: 0.2 K/uL (ref 0.0–0.5)
Eosinophils Relative: 5 %
HCT: 38.3 % (ref 36.0–46.0)
Hemoglobin: 12.7 g/dL (ref 12.0–15.0)
Immature Granulocytes: 0 %
Lymphocytes Relative: 25 %
Lymphs Abs: 1 K/uL (ref 0.7–4.0)
MCH: 30.6 pg (ref 26.0–34.0)
MCHC: 33.2 g/dL (ref 30.0–36.0)
MCV: 92.3 fL (ref 80.0–100.0)
Monocytes Absolute: 0.5 K/uL (ref 0.1–1.0)
Monocytes Relative: 12 %
Neutro Abs: 2.3 K/uL (ref 1.7–7.7)
Neutrophils Relative %: 57 %
Platelet Count: 197 K/uL (ref 150–400)
RBC: 4.15 MIL/uL (ref 3.87–5.11)
RDW: 14.5 % (ref 11.5–15.5)
WBC Count: 4 K/uL (ref 4.0–10.5)
nRBC: 0 % (ref 0.0–0.2)

## 2024-07-17 LAB — CMP (CANCER CENTER ONLY)
ALT: 73 U/L — ABNORMAL HIGH (ref 0–44)
AST: 54 U/L — ABNORMAL HIGH (ref 15–41)
Albumin: 4.3 g/dL (ref 3.5–5.0)
Alkaline Phosphatase: 83 U/L (ref 38–126)
Anion gap: 11 (ref 5–15)
BUN: 8 mg/dL (ref 6–20)
CO2: 27 mmol/L (ref 22–32)
Calcium: 9.4 mg/dL (ref 8.9–10.3)
Chloride: 103 mmol/L (ref 98–111)
Creatinine: 0.68 mg/dL (ref 0.44–1.00)
GFR, Estimated: >60 mL/min (ref >60)
Glucose, Bld: 151 mg/dL — ABNORMAL HIGH (ref 70–99)
Potassium: 4.3 mmol/L (ref 3.5–5.1)
Sodium: 141 mmol/L (ref 135–145)
Total Bilirubin: 0.3 mg/dL (ref 0.0–1.2)
Total Protein: 7.1 g/dL (ref 6.5–8.1)

## 2024-07-17 MED ORDER — ACETAMINOPHEN 325 MG PO TABS
650.0000 mg | ORAL_TABLET | Freq: Once | ORAL | Status: AC
  Administered 2024-07-17: 650 mg via ORAL
  Filled 2024-07-17: qty 2

## 2024-07-17 MED ORDER — APREPITANT 130 MG/18ML IV EMUL
130.0000 mg | Freq: Once | INTRAVENOUS | Status: AC
  Administered 2024-07-17: 130 mg via INTRAVENOUS
  Filled 2024-07-17: qty 18

## 2024-07-17 MED ORDER — DIPHENHYDRAMINE HCL 25 MG PO CAPS
25.0000 mg | ORAL_CAPSULE | Freq: Once | ORAL | Status: AC
  Administered 2024-07-17: 25 mg via ORAL
  Filled 2024-07-17: qty 1

## 2024-07-17 MED ORDER — PROMETHAZINE HCL 25 MG PO TABS
25.0000 mg | ORAL_TABLET | Freq: Once | ORAL | Status: AC
  Administered 2024-07-17: 25 mg via ORAL
  Filled 2024-07-17: qty 1

## 2024-07-17 MED ORDER — DEXAMETHASONE SODIUM PHOSPHATE 10 MG/ML IJ SOLN
10.0000 mg | Freq: Once | INTRAMUSCULAR | Status: AC
  Administered 2024-07-17: 10 mg via INTRAVENOUS

## 2024-07-17 MED ORDER — FAM-TRASTUZUMAB DERUXTECAN-NXKI CHEMO 100 MG IV SOLR
340.0000 mg | Freq: Once | INTRAVENOUS | Status: AC
  Administered 2024-07-17: 340 mg via INTRAVENOUS
  Filled 2024-07-17: qty 17

## 2024-07-17 MED ORDER — DEXTROSE 5 % IV SOLN: Freq: Once | INTRAVENOUS | Status: AC

## 2024-07-17 NOTE — Patient Instructions (Signed)

## 2024-07-17 NOTE — Progress Notes (Signed)
 Patient Care Team: Patient, [name] [name], MD as Medical Oncologist (Hematology and Oncology) [name], MD as Consulting Physician (Radiation Oncology) [name], NP as Nurse Practitioner (Hematology and Oncology) [name], MD as Consulting Physician (General Surgery) [name], RPH-CPP as Pharmacist (Hematology and Oncology)  DIAGNOSIS:  Encounter Diagnosis  Name Primary?   Malignant neoplasm of central portion of left breast in female, estrogen receptor positive (HCC) Yes    SUMMARY OF ONCOLOGIC HISTORY: Oncology History  Cancer of central portion of left female breast (HCC)  04/02/2014 Genetic Testing   BRCA1 [id number] pathogenic mutation identified on gene sequce and del/dup analyses of BRCA1 and BRCA2.  BRCA1 p.H1283R VUS identified as well.  The report date is 04/02/2014.  UPDATE: BRCA1 e.Y8716M VUS has been reclassified as a likely benign variant.  The reclassification date is 04/21/2020.   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 N0 M0 stage IA: Oncotype DX 13 (ROR 9%)   04/16/2014 Surgery   Left upper back melanoma resected no residual melanoma identified on re-resection margins negative   05/26/2014 - 06/08/2015 Anti-estrogen oral therapy   Tamoxifen  20 mg daily with a plan to switch her to aromatase inhibitors once she gets oophorectomy ( patient did not continue antiestrogen therapy by choice)   08/13/2014 Surgery   oophorectomy with total abdominal hysterectomy   09/08/2015 -  Anti-estrogen oral therapy   Resumed anastrozole  after she found recurrence of breast cancer; stopped in January 2018, started letrozole  2.5 mg daily 10/12/2016; switched to Exemestane  25 mg daily on 01/03/17   09/16/2015 Surgery   Skin, left:  IDC grade 3; 0.5 cm, margins negative, ER 95%, PR 10%, HER-2 positive ratio 2.08   11/08/2015 - 02/2017 Chemotherapy   Herceptin  every 3 weeks plus anastrozole    11/24/2015 - 01/14/2016 Radiation Therapy   Adjuvant radiation therapy by Dr. Shannon   01/2017 -  Anti-estrogen oral therapy   Exemestane  daily (stopped others due to side effects), due to muscle aches and pains switch to tamoxifen  5 mg daily starting 12/18/2017   08/07/2019 Relapse/Recurrence   Bone scan: Sclerotic metastatic lesions involving left femoral neck, right ninth rib and entire L5 vertebra; MRI left hip: Diffuse signal abnormality L5 vertebral body with possible extraosseous extension   08/22/2019 - 09/03/2019 Radiation Therapy   Palliative radiation with Dr. Shannon to L5 and left proixmal femur.    10/2021 Treatment Plan Change   Fulvestrant  and Verzenio    08/23/2022 Imaging   CT CAP 08/23/2022: New and enlarged pulmonary nodule consistent with progression.  Enlarged small low cervical and mediastinal nodes.  Multiple bone metastases minimally progressive.  Liver lesions evaluation is challenging because of hepatic steatosis with suspected liver progression, isolated tiny omental nodule     09/07/2022 PET scan   IMPRESSION: 1. Worsening of disease now with nodal, pulmonary and hepatic metastases. 2. Areas of variable FDG uptake with some new bone lesions and some areas of bony metastasis which are more focal, for instance in the pelvis where there was likely diffuse involvement on prior imaging. Many of the areas of sclerosis do not show substantial uptake as outlined above and some areas show improvement/resolution of FDG uptake seen previously but there are signs of active bony metastatic disease currently. 3. Likely LEFT adrenal adenoma. 4. Severe  hepatic steatosis. 5. Aortic atherosclerosis.   Primary malignant neoplasm of breast with metastasis (HCC)  08/25/2019 Initial Diagnosis   Primary malignant neoplasm of  breast with metastasis (HCC)   10/11/2022 -  Chemotherapy   Patient is on Treatment Plan : BREAST METASTATIC Fam-Trastuzumab Deruxtecan-nxki  (Enhertu ) (5.4) q21d       CHIEF COMPLIANT: Follow-up on Enhertu   HISTORY OF PRESENT ILLNESS:   History of Present Illness Krystal [name] is a 58 year old female with metastatic ER+ BRCA1-mutated invasive ductal carcinoma of the left breast who presents for oncology follow-up and evaluation of persistent visual symptoms.  She is receiving fam-trastuzumab deruxtecan-nxki  (Enhertu ), cycle 27, for metastatic disease to bone, liver, lung, brain, and left globe.  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 Symptoms remain bothersome without recent progression, and biopsy of the affected area is not feasible.  Laboratory results today, including white blood cell count, are within normal limits. She denies new systemic symptoms or other acute concerns.      ALLERGIES:  is allergic to dilaudid  [hydromorphone ] and codeine.  MEDICATIONS:  Current Outpatient Medications  Medication Sig Dispense Refill   ALPRAZolam  (XANAX ) 0.25 MG tablet Take 1 tablet (0.25 mg total) by mouth at bedtime as needed for anxiety. 30 tablet 3   aspirin  EC 81 MG tablet Take 1 tablet (81 mg total) by mouth daily. Swallow whole. 90 tablet 3   aspirin -acetaminophen -caffeine (EXCEDRIN MIGRAINE) 250-250-65 MG tablet Take 2 tablets by mouth every 6 (six) hours as needed for headache.     CALCIUM  PO Take 1,200 mg by mouth 2 (two) times daily.     dexamethasone  (DECADRON ) 4 MG tablet Take 1 tablets (4 mg) by mouth daily for 3 days after chemotherapy. Take with food. 30 tablet 1   esomeprazole (NEXIUM) 40 MG capsule Take 40 mg by mouth daily.     lidocaine -prilocaine  (EMLA ) cream Apply to  affected area once 30 g 3   loperamide (IMODIUM) 2 MG capsule Take 2 mg by mouth daily as needed for diarrhea or loose stools. Take 2 tablets at first diarrhea and then 1 tablet after additional loose stool. Max number in 24 hours is 8 tablets.     loratadine (CLARITIN) 10 MG tablet Take 10 mg by mouth daily.     meloxicam  (MOBIC ) 15 MG tablet TAKE 1 TABLET(15 MG) BY MOUTH DAILY AS NEEDED FOR PAIN 30 tablet 1   ondansetron  (ZOFRAN ) 8 MG tablet Take 1 tablet (8 mg total) by mouth every 8 (eight) hours as needed for nausea or vomiting. Start on the third day after chemotherapy. 30 tablet 1   prochlorperazine  (COMPAZINE ) 10 MG tablet Take 1 tablet (10 mg total) by mouth every 6 (six) hours as needed for nausea or vomiting. 30 tablet 1   rosuvastatin  (CRESTOR ) 10 MG tablet Take 1 tablet (10 mg total) by mouth daily. 90 tablet 3   traZODone  (DESYREL ) 50 MG tablet TAKE 1 TABLET(50 MG) BY MOUTH AT BEDTIME 90 tablet 3   venlafaxine  XR (EFFEXOR -XR) 75 MG 24 hr capsule Take 1 capsule (75 mg total) by mouth daily with breakfast. 90 capsule 3   No current facility-administered medications for this visit.    PHYSICAL EXAMINATION: ECOG PERFORMANCE STATUS: 1 - Symptomatic but completely ambulatory  Vitals:   07/17/24 1356  BP: (!) 145/94  Pulse: (!) 114  Resp:  18  Temp: 98 F (36.7 C)  SpO2: 98%   Filed Weights   07/17/24 1356  Weight: 242 lb 11.2 oz (110.1 kg)      LABORATORY DATA:  I have reviewed the data as listed    Latest Ref Rng & Units 06/26/2024    2:48 PM 06/05/2024   11:35 AM 05/15/2024    3:39 PM  CMP  Glucose 70 - 99 mg/dL 818  885  886   BUN 6 - 20 mg/dL 7  8  12    Creatinine 0.44 - 1.00 mg/dL 9.33  9.30  9.31   Sodium 135 - 145 mmol/L 141  142  143   Potassium 3.5 - 5.1 mmol/L 4.0  4.2  4.6   Chloride 98 - 111 mmol/L 104  103  105   CO2 22 - 32 mmol/L 27  32  31   Calcium  8.9 - 10.3 mg/dL 9.2  9.4  9.5   Total Protein 6.5 - 8.1 g/dL 6.9  7.0  7.4   Total Bilirubin 0.0 -  1.2 mg/dL 0.3  0.4  0.4   Alkaline Phos 38 - 126 U/L 85  77  76   AST 15 - 41 U/L 40  43  35   ALT 0 - 44 U/L 57  55  49     Lab Results  Component Value Date   WBC 4.0 07/17/2024   HGB 12.7 07/17/2024   HCT 38.3 07/17/2024   MCV 92.3 07/17/2024   PLT 197 07/17/2024   NEUTROABS 2.3 07/17/2024    ASSESSMENT & PLAN:  Cancer of central portion of left female breast (HCC) 08/22/2019: PET CT scan: Metastatic disease in the chest with multiple lung nodules all subcentimeter size and hypermetabolic lymph nodes (right paratracheal node 7 mm, subcarinal node 1 cm, right hilar node 6 mm), retroperitoneal and upper pelvic common iliac lymph nodes, bone metastases especially L5, L4, T6, right 10th rib, left proximal femur   Bone biopsy was not felt to be reliable in addition to the effect of radiation on the bone. Palliative radiation completed 09/03/2019 [facility] 360: BRCA1 mutation, TMB 5.36, MSI: Normal   Prior treatment: Ibrance  with letrozole  and Xgeva for bone metastases switched to abemaciclib  and Fulvestrant    Bone metastasis: Patient does not want to take bisphosphonates CT CAP 08/23/2022: New and enlarged pulmonary nodule consistent with progression.  Enlarged small low cervical and mediastinal nodes.  Multiple bone metastases minimally progressive.  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  Skull metastases, abnormal enhancement left globe  ---------------------------------------------------------------- Current treatment:: Enhertu  cycle 29 Enhertu  toxicities: Fatigue Monitoring closely for toxicities    CT CAP 10/11/2023:  Continued decrease in the pulmonary metastases, no significant change in the liver lesions, unchanged bone metastases CT CAP 03/28/2024: Stable bone metastases, no suspicious lung nodules.   Eye metastases: Status post radiation.  Ophthalmologist apparently reviewed her eye and felt it did not shrink.  She continues to have vision problems in the left eye. Dr. Buckley recommended continued MRI surveillance   Brain MRI 04/29/2024: Signal abnormality posterior aspect of the left globe 3 to 4 mm.  Unchanged intracranial metastatic disease unchanged calvarial metastases   Brain MRI  scheduled for 07/24/2024 Return to clinic the next cycle of Enhertu       No orders of the defined types were placed in this encounter.  The patient has a good understanding of the overall plan. she agrees with it. she will call with any problems that may develop before the next visit here.  I personally spent a total of 30 minutes in the care of the patient today including preparing to see the patient, getting/reviewing separately obtained history, performing a medically appropriate exam/evaluation, counseling and educating, placing orders, referring and communicating with other health care professionals, documenting clinical information in the EHR, independently interpreting results, communicating results, and coordinating care.   Viinay K Heiress Williamson, MD 07/17/2024

## 2024-07-17 NOTE — Assessment & Plan Note (Signed)
 08/22/2019: PET CT scan: Metastatic disease in the chest with multiple lung nodules all subcentimeter size and hypermetabolic lymph nodes (right paratracheal node 7 mm, subcarinal node 1 cm, right hilar node 6 mm), retroperitoneal and upper pelvic common iliac lymph nodes, bone metastases especially L5, L4, T6, right 10th rib, left proximal femur   Bone biopsy was not felt to be reliable in addition to the effect of radiation on the bone. Palliative radiation completed 09/03/2019 [facility] 360: BRCA1 mutation, TMB 5.36, MSI: Normal   Prior treatment: Ibrance  with letrozole  and Xgeva for bone metastases switched to abemaciclib  and Fulvestrant    Bone metastasis: Patient does not want to take bisphosphonates CT CAP 08/23/2022: New and enlarged pulmonary nodule consistent with progression.  Enlarged small low cervical and mediastinal nodes.  Multiple bone metastases minimally progressive.  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  Skull metastases, abnormal enhancement left globe  ---------------------------------------------------------------- Current treatment:: Enhertu  cycle 27 Enhertu  toxicities: Fatigue Monitoring closely for toxicities    CT CAP 10/11/2023: Continued decrease in the pulmonary metastases, no significant change in the liver lesions, unchanged bone metastases CT CAP 03/28/2024: Stable bone metastases, no suspicious lung nodules.   Eye metastases: Status post radiation.  Ophthalmologist apparently reviewed her eye and felt it did not shrink.  She continues to have vision problems in the left eye. Dr. Buckley recommended continued  MRI surveillance   Brain MRI 04/29/2024: Signal abnormality posterior aspect of the left globe 3 to 4 mm.  Unchanged intracranial metastatic disease unchanged calvarial metastases   Brain MRI scheduled for 07/24/2024

## 2024-07-21 ENCOUNTER — Other Ambulatory Visit: Payer: Self-pay | Admitting: *Deleted

## 2024-07-21 ENCOUNTER — Other Ambulatory Visit: Payer: Self-pay

## 2024-07-21 ENCOUNTER — Telehealth: Payer: Self-pay | Admitting: *Deleted

## 2024-07-21 MED ORDER — AZITHROMYCIN 250 MG PO TABS: ORAL_TABLET | ORAL | 0 refills | Status: DC

## 2024-07-21 NOTE — Telephone Encounter (Signed)
 Called pt to make aware that Z-Pak has been sent to pharmacy. Pt verbalized understanding

## 2024-07-24 ENCOUNTER — Telehealth: Payer: Self-pay | Admitting: *Deleted

## 2024-07-24 ENCOUNTER — Other Ambulatory Visit

## 2024-07-24 NOTE — Telephone Encounter (Signed)
 Patient canceled her MRI because she is sick.  She said she also needed to cancel her visit with Dr Buckley.  Will try to get rescheduled after first of the year.

## 2024-07-28 ENCOUNTER — Ambulatory Visit: Admitting: Internal Medicine

## 2024-07-30 ENCOUNTER — Telehealth: Payer: Self-pay | Admitting: *Deleted

## 2024-07-30 NOTE — Telephone Encounter (Signed)
 PC to patient, informed her MRI brain needs to be scheduled by calling [facility], she says she has the number.  Also instructed her to schedule F/U appointment with Dr Buckley once she h as her MRI date.  She verbalizes understanding, states she will schedule after the first of the year.

## 2024-08-04 ENCOUNTER — Inpatient Hospital Stay: Admitting: Internal Medicine

## 2024-08-08 ENCOUNTER — Inpatient Hospital Stay

## 2024-08-08 ENCOUNTER — Inpatient Hospital Stay: Attending: Hematology and Oncology

## 2024-08-08 VITALS — BP 143/87 | HR 85 | Temp 97.8°F | Resp 18 | Wt 246.5 lb

## 2024-08-08 DIAGNOSIS — R110 Nausea: Secondary | ICD-10-CM | POA: Insufficient documentation

## 2024-08-08 DIAGNOSIS — C787 Secondary malignant neoplasm of liver and intrahepatic bile duct: Secondary | ICD-10-CM | POA: Insufficient documentation

## 2024-08-08 DIAGNOSIS — Z1501 Genetic susceptibility to malignant neoplasm of breast: Secondary | ICD-10-CM | POA: Diagnosis not present

## 2024-08-08 DIAGNOSIS — K760 Fatty (change of) liver, not elsewhere classified: Secondary | ICD-10-CM | POA: Diagnosis not present

## 2024-08-08 DIAGNOSIS — C50112 Malignant neoplasm of central portion of left female breast: Secondary | ICD-10-CM | POA: Insufficient documentation

## 2024-08-08 DIAGNOSIS — C7951 Secondary malignant neoplasm of bone: Secondary | ICD-10-CM | POA: Diagnosis not present

## 2024-08-08 DIAGNOSIS — Z79811 Long term (current) use of aromatase inhibitors: Secondary | ICD-10-CM | POA: Insufficient documentation

## 2024-08-08 DIAGNOSIS — I700 Atherosclerosis of aorta: Secondary | ICD-10-CM | POA: Insufficient documentation

## 2024-08-08 DIAGNOSIS — Z79899 Other long term (current) drug therapy: Secondary | ICD-10-CM | POA: Diagnosis not present

## 2024-08-08 DIAGNOSIS — D72819 Decreased white blood cell count, unspecified: Secondary | ICD-10-CM | POA: Insufficient documentation

## 2024-08-08 DIAGNOSIS — R5383 Other fatigue: Secondary | ICD-10-CM | POA: Insufficient documentation

## 2024-08-08 DIAGNOSIS — Z5112 Encounter for antineoplastic immunotherapy: Secondary | ICD-10-CM | POA: Diagnosis not present

## 2024-08-08 DIAGNOSIS — Z7952 Long term (current) use of systemic steroids: Secondary | ICD-10-CM | POA: Insufficient documentation

## 2024-08-08 DIAGNOSIS — Z9221 Personal history of antineoplastic chemotherapy: Secondary | ICD-10-CM | POA: Insufficient documentation

## 2024-08-08 DIAGNOSIS — C50919 Malignant neoplasm of unspecified site of unspecified female breast: Secondary | ICD-10-CM

## 2024-08-08 DIAGNOSIS — Z7982 Long term (current) use of aspirin: Secondary | ICD-10-CM | POA: Insufficient documentation

## 2024-08-08 DIAGNOSIS — Z923 Personal history of irradiation: Secondary | ICD-10-CM | POA: Diagnosis not present

## 2024-08-08 DIAGNOSIS — Z79620 Long term (current) use of immunosuppressive biologic: Secondary | ICD-10-CM | POA: Diagnosis not present

## 2024-08-08 DIAGNOSIS — C7801 Secondary malignant neoplasm of right lung: Secondary | ICD-10-CM | POA: Insufficient documentation

## 2024-08-08 LAB — CBC WITH DIFFERENTIAL (CANCER CENTER ONLY)
Abs Immature Granulocytes: 0.01 K/uL (ref 0.00–0.07)
Basophils Absolute: 0 K/uL (ref 0.0–0.1)
Basophils Relative: 1 %
Eosinophils Absolute: 0.3 K/uL (ref 0.0–0.5)
Eosinophils Relative: 7 %
HCT: 37.6 % (ref 36.0–46.0)
Hemoglobin: 12.6 g/dL (ref 12.0–15.0)
Immature Granulocytes: 0 %
Lymphocytes Relative: 22 %
Lymphs Abs: 1 K/uL (ref 0.7–4.0)
MCH: 31 pg (ref 26.0–34.0)
MCHC: 33.5 g/dL (ref 30.0–36.0)
MCV: 92.4 fL (ref 80.0–100.0)
Monocytes Absolute: 0.4 K/uL (ref 0.1–1.0)
Monocytes Relative: 10 %
Neutro Abs: 2.8 K/uL (ref 1.7–7.7)
Neutrophils Relative %: 60 %
Platelet Count: 227 K/uL (ref 150–400)
RBC: 4.07 MIL/uL (ref 3.87–5.11)
RDW: 14.7 % (ref 11.5–15.5)
WBC Count: 4.5 K/uL (ref 4.0–10.5)
nRBC: 0 % (ref 0.0–0.2)

## 2024-08-08 LAB — CMP (CANCER CENTER ONLY)
ALT: 57 U/L — ABNORMAL HIGH (ref 0–44)
AST: 50 U/L — ABNORMAL HIGH (ref 15–41)
Albumin: 4.2 g/dL (ref 3.5–5.0)
Alkaline Phosphatase: 84 U/L (ref 38–126)
Anion gap: 10 (ref 5–15)
BUN: 9 mg/dL (ref 6–20)
CO2: 28 mmol/L (ref 22–32)
Calcium: 9.2 mg/dL (ref 8.9–10.3)
Chloride: 103 mmol/L (ref 98–111)
Creatinine: 0.56 mg/dL (ref 0.44–1.00)
GFR, Estimated: >60 mL/min
Glucose, Bld: 137 mg/dL — ABNORMAL HIGH (ref 70–99)
Potassium: 4.1 mmol/L (ref 3.5–5.1)
Sodium: 141 mmol/L (ref 135–145)
Total Bilirubin: 0.3 mg/dL (ref 0.0–1.2)
Total Protein: 7 g/dL (ref 6.5–8.1)

## 2024-08-08 MED ORDER — DEXAMETHASONE SODIUM PHOSPHATE 10 MG/ML IJ SOLN
10.0000 mg | Freq: Once | INTRAMUSCULAR | Status: AC
  Administered 2024-08-08: 10 mg via INTRAVENOUS

## 2024-08-08 MED ORDER — APREPITANT 130 MG/18ML IV EMUL
130.0000 mg | Freq: Once | INTRAVENOUS | Status: AC
  Administered 2024-08-08: 130 mg via INTRAVENOUS
  Filled 2024-08-08: qty 18

## 2024-08-08 MED ORDER — PROMETHAZINE HCL 25 MG PO TABS
25.0000 mg | ORAL_TABLET | Freq: Once | ORAL | Status: AC
  Administered 2024-08-08: 25 mg via ORAL
  Filled 2024-08-08: qty 1

## 2024-08-08 MED ORDER — ACETAMINOPHEN 325 MG PO TABS
650.0000 mg | ORAL_TABLET | Freq: Once | ORAL | Status: AC
  Administered 2024-08-08: 650 mg via ORAL
  Filled 2024-08-08: qty 2

## 2024-08-08 MED ORDER — FAM-TRASTUZUMAB DERUXTECAN-NXKI CHEMO 100 MG IV SOLR
340.0000 mg | Freq: Once | INTRAVENOUS | Status: AC
  Administered 2024-08-08: 340 mg via INTRAVENOUS
  Filled 2024-08-08: qty 17

## 2024-08-08 MED ORDER — DIPHENHYDRAMINE HCL 25 MG PO CAPS
25.0000 mg | ORAL_CAPSULE | Freq: Once | ORAL | Status: AC
  Administered 2024-08-08: 25 mg via ORAL
  Filled 2024-08-08: qty 1

## 2024-08-08 MED ORDER — DEXTROSE 5 % IV SOLN: Freq: Once | INTRAVENOUS | Status: AC

## 2024-08-08 NOTE — Patient Instructions (Signed)

## 2024-08-21 ENCOUNTER — Other Ambulatory Visit: Payer: Self-pay

## 2024-08-23 ENCOUNTER — Other Ambulatory Visit: Payer: Self-pay | Admitting: Hematology and Oncology

## 2024-08-28 ENCOUNTER — Inpatient Hospital Stay

## 2024-08-28 ENCOUNTER — Inpatient Hospital Stay: Admitting: Hematology and Oncology

## 2024-08-28 VITALS — BP 123/71 | HR 82 | Temp 97.8°F | Resp 18 | Ht 64.0 in | Wt 242.0 lb

## 2024-08-28 DIAGNOSIS — C50919 Malignant neoplasm of unspecified site of unspecified female breast: Secondary | ICD-10-CM | POA: Diagnosis not present

## 2024-08-28 DIAGNOSIS — Z170 Estrogen receptor positive status [ER+]: Secondary | ICD-10-CM | POA: Diagnosis not present

## 2024-08-28 DIAGNOSIS — C50112 Malignant neoplasm of central portion of left female breast: Secondary | ICD-10-CM | POA: Diagnosis not present

## 2024-08-28 LAB — CBC WITH DIFFERENTIAL (CANCER CENTER ONLY)
Abs Immature Granulocytes: 0 K/uL (ref 0.00–0.07)
Basophils Absolute: 0 K/uL (ref 0.0–0.1)
Basophils Relative: 1 %
Eosinophils Absolute: 0.2 K/uL (ref 0.0–0.5)
Eosinophils Relative: 7 %
HCT: 36.4 % (ref 36.0–46.0)
Hemoglobin: 12.2 g/dL (ref 12.0–15.0)
Immature Granulocytes: 0 %
Lymphocytes Relative: 28 %
Lymphs Abs: 0.8 K/uL (ref 0.7–4.0)
MCH: 31.2 pg (ref 26.0–34.0)
MCHC: 33.5 g/dL (ref 30.0–36.0)
MCV: 93.1 fL (ref 80.0–100.0)
Monocytes Absolute: 0.3 K/uL (ref 0.1–1.0)
Monocytes Relative: 11 %
Neutro Abs: 1.5 K/uL — ABNORMAL LOW (ref 1.7–7.7)
Neutrophils Relative %: 53 %
Platelet Count: 223 K/uL (ref 150–400)
RBC: 3.91 MIL/uL (ref 3.87–5.11)
RDW: 14.5 % (ref 11.5–15.5)
WBC Count: 2.8 K/uL — ABNORMAL LOW (ref 4.0–10.5)
nRBC: 0 % (ref 0.0–0.2)

## 2024-08-28 LAB — CMP (CANCER CENTER ONLY)
ALT: 63 U/L — ABNORMAL HIGH (ref 0–44)
AST: 41 U/L (ref 15–41)
Albumin: 4.2 g/dL (ref 3.5–5.0)
Alkaline Phosphatase: 80 U/L (ref 38–126)
Anion gap: 9 (ref 5–15)
BUN: 8 mg/dL (ref 6–20)
CO2: 29 mmol/L (ref 22–32)
Calcium: 8.7 mg/dL — ABNORMAL LOW (ref 8.9–10.3)
Chloride: 104 mmol/L (ref 98–111)
Creatinine: 0.62 mg/dL (ref 0.44–1.00)
GFR, Estimated: >60 mL/min
Glucose, Bld: 153 mg/dL — ABNORMAL HIGH (ref 70–99)
Potassium: 4 mmol/L (ref 3.5–5.1)
Sodium: 142 mmol/L (ref 135–145)
Total Bilirubin: 0.3 mg/dL (ref 0.0–1.2)
Total Protein: 6.7 g/dL (ref 6.5–8.1)

## 2024-08-28 MED ORDER — ONDANSETRON HCL 8 MG PO TABS: 8.0000 mg | ORAL_TABLET | Freq: Three times a day (TID) | ORAL | 3 refills | Status: AC | PRN

## 2024-08-28 MED ORDER — DEXAMETHASONE SODIUM PHOSPHATE 10 MG/ML IJ SOLN
10.0000 mg | Freq: Once | INTRAMUSCULAR | Status: AC
  Administered 2024-08-28: 10 mg via INTRAVENOUS

## 2024-08-28 MED ORDER — DEXTROSE 5 % IV SOLN: Freq: Once | INTRAVENOUS | Status: AC

## 2024-08-28 MED ORDER — DEXAMETHASONE 4 MG PO TABS: ORAL_TABLET | ORAL | 1 refills | Status: AC

## 2024-08-28 MED ORDER — APREPITANT 130 MG/18ML IV EMUL
130.0000 mg | Freq: Once | INTRAVENOUS | Status: AC
  Administered 2024-08-28: 130 mg via INTRAVENOUS
  Filled 2024-08-28: qty 18

## 2024-08-28 MED ORDER — PROMETHAZINE HCL 25 MG PO TABS
25.0000 mg | ORAL_TABLET | Freq: Once | ORAL | Status: AC
  Administered 2024-08-28: 25 mg via ORAL
  Filled 2024-08-28: qty 1

## 2024-08-28 MED ORDER — FAM-TRASTUZUMAB DERUXTECAN-NXKI CHEMO 100 MG IV SOLR
340.0000 mg | Freq: Once | INTRAVENOUS | Status: AC
  Administered 2024-08-28: 340 mg via INTRAVENOUS
  Filled 2024-08-28: qty 17

## 2024-08-28 MED ORDER — DIPHENHYDRAMINE HCL 25 MG PO CAPS
25.0000 mg | ORAL_CAPSULE | Freq: Once | ORAL | Status: AC
  Administered 2024-08-28: 25 mg via ORAL
  Filled 2024-08-28: qty 1

## 2024-08-28 MED ORDER — ACETAMINOPHEN 325 MG PO TABS
650.0000 mg | ORAL_TABLET | Freq: Once | ORAL | Status: AC
  Administered 2024-08-28: 650 mg via ORAL
  Filled 2024-08-28: qty 2

## 2024-08-28 NOTE — Progress Notes (Signed)
 "  Patient Care Team: Patient, [name] [name], MD as Medical Oncologist (Hematology and Oncology) [name], MD as Consulting Physician (Radiation Oncology) Crawford, Morna Pickle, NP as Nurse Practitioner (Hematology and Oncology) [name], MD as Consulting Physician (General Surgery) [name], RPH-CPP as Pharmacist (Hematology and Oncology)  DIAGNOSIS:  Encounter Diagnoses  Name Primary?   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  BRCA1 [id number] VUS identified as well.  The report date is 04/02/2014.  UPDATE: BRCA1 [id number] VUS has been reclassified as a likely benign variant.  The reclassification date is 04/21/2020.   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 N0 M0 stage IA: Oncotype DX 13 (ROR 9%)   04/16/2014 Surgery   Left upper back melanoma resected no residual melanoma identified on re-resection margins negative   05/26/2014 - 06/08/2015 Anti-estrogen oral therapy   Tamoxifen  20 mg daily with a plan to switch her to aromatase inhibitors once she gets oophorectomy ( patient did not continue antiestrogen therapy by choice)   08/13/2014 Surgery   oophorectomy with total abdominal hysterectomy   09/08/2015 -  Anti-estrogen oral therapy   Resumed anastrozole  after she found recurrence of breast cancer; stopped in January 2018, started letrozole  2.5 mg daily 10/12/2016; switched to  Exemestane  25 mg daily on 01/03/17   09/16/2015 Surgery   Skin, left: IDC grade 3; 0.5 cm, margins negative, ER 95%, PR 10%, HER-2 positive ratio 2.08   11/08/2015 - 02/2017 Chemotherapy   Herceptin  every 3 weeks plus anastrozole    11/24/2015 - 01/14/2016 Radiation Therapy   Adjuvant radiation therapy by Dr. Shannon   01/2017 -  Anti-estrogen oral therapy   Exemestane  daily (stopped others due to side effects), due to muscle aches and pains switch to tamoxifen  5 mg daily starting 12/18/2017   08/07/2019 Relapse/Recurrence   Bone scan: Sclerotic metastatic lesions involving left femoral neck, right ninth rib and entire L5 vertebra; MRI left hip: Diffuse signal abnormality L5 vertebral body with possible extraosseous extension   08/22/2019 - 09/03/2019 Radiation Therapy   Palliative radiation with Dr. Shannon to L5 and left proixmal femur.    10/2021 Treatment Plan Change   Fulvestrant  and Verzenio    08/23/2022 Imaging   CT CAP 08/23/2022: New and enlarged pulmonary nodule consistent with progression.  Enlarged small low cervical and mediastinal nodes.  Multiple bone metastases minimally progressive.  Liver lesions evaluation is challenging because of hepatic steatosis with suspected liver progression, isolated tiny omental nodule     09/07/2022 PET scan   IMPRESSION: 1. Worsening of disease now with nodal, pulmonary and hepatic metastases. 2. Areas of variable FDG uptake with some new bone lesions and some areas of bony metastasis which are more focal, for instance in the pelvis where there was likely diffuse involvement on prior imaging. Many of the areas of sclerosis do not show substantial uptake as outlined above and some areas show improvement/resolution of FDG uptake seen previously but there are signs of  active bony metastatic disease currently. 3. Likely LEFT adrenal adenoma. 4. Severe hepatic steatosis. 5. Aortic atherosclerosis.   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  She remains on fam-trastuzumab deruxtecan-nxki  with chronic stable fatigue and intermittent mild nausea related to therapy, partially relieved with dexamethasone  and ondansetron . She denies new focal neurologic symptoms, neuropathy, or upper extremity limitations and is working, though she needs marked screen magnification due to visual impairment.  She had an acute illness the week before Christmas with fatigue, nausea, and malaise treated with azithromycin . Symptoms gradually improved and she is now back to baseline. On January 13th she noticed a transient marbled erythematous area on her chest after wearing a tight bra. She has no ongoing concern or persistent skin changes.  She continues to have stable visual disturbance in the right eye with wavy, distorted vision and squiggly lines and lightning-like phenomena, which she associates with prior radiation to the left globe. Symptoms worsen with fatigue but are unchanged since the last visit. She is dissatisfied with prior communication from the radiation team and feels the treatment was not as curative as presented.  She was notified of new leukopenia with WBC 2.8 on today's labs. She has had no new medications aside from recent azithromycin  for infection and has no infectious symptoms or other complaints that would preclude treatment today.  She is practicing intermittent fasting, often skipping one meal per day and timing food intake with  medications. She reports diabetes is currently controlled but is concerned about how these dietary changes and intake of sugar, such as honey in tea, might affect her symptoms and overall health.      ALLERGIES:  is allergic to dilaudid  [hydromorphone ] and codeine.  MEDICATIONS:  Current Outpatient Medications  Medication Sig Dispense Refill   ALPRAZolam  (XANAX ) 0.25 MG tablet Take 1 tablet (0.25 mg total) by mouth at bedtime as needed for anxiety. 30 tablet 3   aspirin  EC 81 MG tablet Take 1 tablet (81 mg total) by mouth daily. Swallow whole. 90 tablet 3   aspirin -acetaminophen -caffeine (EXCEDRIN MIGRAINE) 250-250-65 MG tablet Take 2 tablets by mouth every 6 (six) hours as needed for headache.     CALCIUM  PO Take 1,200 mg by mouth 2 (two) times daily.     dexamethasone  (DECADRON ) 4 MG tablet Take 1 tablets (4 mg) by mouth daily for 3 days after chemotherapy. Take with food. 30 tablet 1   esomeprazole (NEXIUM) 40 MG capsule Take 40 mg by mouth daily.     lidocaine -prilocaine  (EMLA ) cream Apply to affected area once 30 g 3   loperamide (IMODIUM) 2 MG capsule Take 2 mg by mouth daily as needed for diarrhea or loose stools. Take 2 tablets at first diarrhea and then 1 tablet after additional loose stool. Max number in 24 hours is 8 tablets.     loratadine (CLARITIN) 10 MG tablet Take 10 mg by mouth daily.     meloxicam  (MOBIC ) 15 MG tablet TAKE 1 TABLET(15 MG) BY MOUTH DAILY AS NEEDED FOR PAIN 30  tablet 1   ondansetron  (ZOFRAN ) 8 MG tablet Take 1 tablet (8 mg total) by mouth every 8 (eight) hours as needed for nausea or vomiting. Start on the third day after chemotherapy. 30 tablet 3   prochlorperazine  (COMPAZINE ) 10 MG tablet Take 1 tablet (10 mg total) by mouth every 6 (six) hours as needed for nausea or vomiting. 30 tablet 1   rosuvastatin  (CRESTOR ) 10 MG tablet Take 1 tablet (10 mg total) by mouth daily. 90 tablet 3   traZODone  (DESYREL ) 50 MG tablet TAKE 1 TABLET(50 MG) BY MOUTH AT BEDTIME 90  tablet 3   venlafaxine  XR (EFFEXOR -XR) 75 MG 24 hr capsule Take 1 capsule (75 mg total) by mouth daily with breakfast. 90 capsule 3   No current facility-administered medications for this visit.   Facility-Administered Medications Ordered in Other Visits  Medication Dose Route Frequency Provider Last Rate Last Admin   fam-trastuzumab deruxtecan-nxki  (ENHERTU ) 340 mg in dextrose  5 % 100 mL chemo infusion  340 mg Intravenous Once [name], MD        PHYSICAL EXAMINATION: ECOG PERFORMANCE STATUS: 1 - Symptomatic but completely ambulatory  Vitals:   08/28/24 1528  BP: 123/71  Pulse: 82  Resp: 18  Temp: 97.8 F (36.6 C)  SpO2: 98%   Filed Weights   08/28/24 1528  Weight: 242 lb (109.8 kg)      LABORATORY DATA:  I have reviewed the data as listed    Latest Ref Rng & Units 08/28/2024    2:56 PM 08/08/2024    1:23 PM 07/17/2024    1:32 PM  CMP  Glucose 70 - 99 mg/dL 846  862  848   BUN 6 - 20 mg/dL 8  9  8    Creatinine 0.44 - 1.00 mg/dL 9.37  9.43  9.31   Sodium 135 - 145 mmol/L 142  141  141   Potassium 3.5 - 5.1 mmol/L 4.0  4.1  4.3   Chloride 98 - 111 mmol/L 104  103  103   CO2 22 - 32 mmol/L 29  28  27    Calcium  8.9 - 10.3 mg/dL 8.7  9.2  9.4   Total Protein 6.5 - 8.1 g/dL 6.7  7.0  7.1   Total Bilirubin 0.0 - 1.2 mg/dL 0.3  0.3  0.3   Alkaline Phos 38 - 126 U/L 80  84  83   AST 15 - 41 U/L 41  50  54   ALT 0 - 44 U/L 63  57  73     Lab Results  Component Value Date   WBC 2.8 (L) 08/28/2024   HGB 12.2 08/28/2024   HCT 36.4 08/28/2024   MCV 93.1 08/28/2024   PLT 223 08/28/2024   NEUTROABS 1.5 (L) 08/28/2024    ASSESSMENT & PLAN:  Cancer of central portion of left female breast (HCC) 08/22/2019: PET CT scan: Metastatic disease in the chest with multiple lung nodules all subcentimeter size and hypermetabolic lymph nodes (right paratracheal node 7 mm, subcarinal node 1 cm, right hilar node 6 mm), retroperitoneal and upper pelvic common iliac lymph nodes, bone  metastases especially L5, L4, T6, right 10th rib, left proximal femur   Bone biopsy was not felt to be reliable in addition to the effect of radiation on the bone. Palliative radiation completed 09/03/2019 [facility]: BRCA1 mutation, TMB 5.36, MSI: Normal   Prior treatment: Ibrance  with letrozole  and Xgeva for bone metastases switched to abemaciclib  and Fulvestrant    Bone metastasis:  Patient does not want to take bisphosphonates CT CAP 08/23/2022: New and enlarged pulmonary nodule consistent with progression.  Enlarged small low cervical and mediastinal nodes.  Multiple bone metastases minimally progressive.  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  Skull metastases, abnormal enhancement left globe  ---------------------------------------------------------------- Current treatment:: Enhertu  cycle 31 Enhertu  toxicities: Fatigue Monitoring closely for toxicities    CT CAP 10/11/2023: Continued decrease in the pulmonary metastases, no significant change in the liver lesions, unchanged bone metastases CT CAP 03/28/2024: Stable bone metastases, no suspicious lung nodules.   Eye metastases: Status post radiation.  Ophthalmologist apparently reviewed her eye and felt it did not shrink.  She continues to have vision problems in the left eye. Dr. Buckley recommended continued MRI surveillance   Brain MRI 04/29/2024: Signal abnormality posterior aspect of the left globe 3 to 4 mm.  Unchanged intracranial metastatic disease unchanged calvarial metastases   Brain MRI scheduled for 07/24/2024 Return to clinic the next cycle of Enhertu .  Scans will be  done in March       Orders Placed This Encounter  Procedures   CT CHEST ABDOMEN PELVIS W CONTRAST    Standing Status:   Future    Expected Date:   10/01/2024    Expiration Date:   08/28/2025    If indicated for the ordered procedure, I authorize the administration of contrast media per Radiology protocol:   Yes    Does the patient have a contrast media/X-ray dye allergy?:   No    Preferred imaging location?:   [facility]    Release to patient:   Immediate    If indicated for the ordered procedure, I authorize the administration of oral contrast media per Radiology protocol:   No    Reason for no oral contrast::   Breast   The patient has a good understanding of the overall plan. she agrees with it. she will call with any problems that may develop before the next visit here.  I personally spent a total of 30 minutes in the care of the patient today including preparing to see the patient, getting/reviewing separately obtained history, performing a medically appropriate exam/evaluation, counseling and educating, placing orders, referring and communicating with other health care professionals, documenting clinical information in the EHR, independently interpreting results, communicating results, and coordinating care.   Viinay K Mariellen Blaney, MD 08/28/24    "

## 2024-08-28 NOTE — Patient Instructions (Signed)
 [facility] - A DEPT OF [facility]  Discharge Instructions: Thank you for choosing [facility] to provide your oncology and hematology care.   If you have a lab appointment with the Cancer Center, please go directly to the Cancer Center and check in at the registration area.   Wear comfortable clothing and clothing appropriate for easy access to any Portacath or PICC line.   We strive to give you quality time with your provider. You may need to reschedule your appointment if you arrive late (15 or more minutes).  Arriving late affects you and other patients whose appointments are after yours.  Also, if you miss three or more appointments without notifying the office, you may be dismissed from the clinic at the provider's discretion.      For prescription refill requests, have your pharmacy contact our office and allow 72 hours for refills to be completed.    Today you received the following chemotherapy and/or immunotherapy agents: fam-trastuzumab deruxtecan-nxki (ENHERTU     To help prevent nausea and vomiting after your treatment, we encourage you to take your nausea medication as directed.  BELOW ARE SYMPTOMS THAT SHOULD BE REPORTED IMMEDIATELY: *FEVER GREATER THAN 100.4 F (38 C) OR HIGHER *CHILLS OR SWEATING *NAUSEA AND VOMITING THAT IS NOT CONTROLLED WITH YOUR NAUSEA MEDICATION *UNUSUAL SHORTNESS OF BREATH *UNUSUAL BRUISING OR BLEEDING *URINARY PROBLEMS (pain or burning when urinating, or frequent urination) *BOWEL PROBLEMS (unusual diarrhea, constipation, pain near the anus) TENDERNESS IN MOUTH AND THROAT WITH OR WITHOUT PRESENCE OF ULCERS (sore throat, sores in mouth, or a toothache) UNUSUAL RASH, SWELLING OR PAIN  UNUSUAL VAGINAL DISCHARGE OR ITCHING   Items with * indicate a potential emergency and should be followed up as soon as possible or go to the Emergency Department if any problems should occur.  Please show the  CHEMOTHERAPY ALERT CARD or IMMUNOTHERAPY ALERT CARD at check-in to the Emergency Department and triage nurse.  Should you have questions after your visit or need to cancel or reschedule your appointment, please contact [facility] - A DEPT OF [facility]  Dept: [phone]  and follow the prompts.  Office hours are 8:00 a.m. to 4:30 p.m. Monday - Friday. Please note that voicemails left after 4:00 p.m. may not be returned until the following business day.  We are closed weekends and major holidays. You have access to a nurse at all times for urgent questions. Please call the main number to the clinic Dept: [phone] and follow the prompts.   For any non-urgent questions, you may also contact your provider using MyChart. We now offer e-Visits for anyone 51 and older to request care online for non-urgent symptoms. For details visit [email or web address].   Also download the MyChart app! Go to the app store, search "MyChart", open the app, select [facility], and log in with your MyChart username and password.

## 2024-08-28 NOTE — Assessment & Plan Note (Signed)
 08/22/2019: PET CT scan: Metastatic disease in the chest with multiple lung nodules all subcentimeter size and hypermetabolic lymph nodes (right paratracheal node 7 mm, subcarinal node 1 cm, right hilar node 6 mm), retroperitoneal and upper pelvic common iliac lymph nodes, bone metastases especially L5, L4, T6, right 10th rib, left proximal femur   Bone biopsy was not felt to be reliable in addition to the effect of radiation on the bone. Palliative radiation completed 09/03/2019 [facility]: BRCA1 mutation, TMB 5.36, MSI: Normal   Prior treatment: Ibrance  with letrozole  and Xgeva for bone metastases switched to abemaciclib  and Fulvestrant    Bone metastasis: Patient does not want to take bisphosphonates CT CAP 08/23/2022: New and enlarged pulmonary nodule consistent with progression.  Enlarged small low cervical and mediastinal nodes.  Multiple bone metastases minimally progressive.  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  Skull metastases, abnormal enhancement left globe  ---------------------------------------------------------------- Current treatment:: Enhertu  cycle 31 Enhertu  toxicities: Fatigue Monitoring closely for toxicities    CT CAP 10/11/2023: Continued decrease in the pulmonary metastases, no significant change in the liver lesions, unchanged bone metastases CT CAP 03/28/2024: Stable bone metastases, no suspicious lung nodules.   Eye metastases: Status post radiation.  Ophthalmologist apparently reviewed her eye and felt it did not shrink.  She continues to have vision problems in the left eye. Dr. Buckley recommended continued  MRI surveillance   Brain MRI 04/29/2024: Signal abnormality posterior aspect of the left globe 3 to 4 mm.  Unchanged intracranial metastatic disease unchanged calvarial metastases   Brain MRI scheduled for 07/24/2024 Return to clinic the next cycle of Enhertu  and we will do scans prior to the visit

## 2024-09-10 ENCOUNTER — Other Ambulatory Visit: Payer: Self-pay

## 2024-09-18 ENCOUNTER — Inpatient Hospital Stay: Attending: Hematology and Oncology

## 2024-09-18 ENCOUNTER — Inpatient Hospital Stay

## 2024-09-18 ENCOUNTER — Inpatient Hospital Stay: Admitting: Hematology and Oncology

## 2024-10-09 ENCOUNTER — Inpatient Hospital Stay

## 2024-10-09 ENCOUNTER — Inpatient Hospital Stay: Admitting: Hematology and Oncology
